# Patient Record
Sex: Female | Born: 1950 | Race: White | Hispanic: No | Smoking: Never smoker
Health system: Southern US, Community
[De-identification: ages and names within clinical notes are randomized; demographics above are authoritative.]

## PROBLEM LIST (undated history)

## (undated) DIAGNOSIS — K859 Acute pancreatitis without necrosis or infection, unspecified: Secondary | ICD-10-CM

## (undated) DIAGNOSIS — L570 Actinic keratosis: Secondary | ICD-10-CM

## (undated) DIAGNOSIS — N179 Acute kidney failure, unspecified: Secondary | ICD-10-CM

## (undated) DIAGNOSIS — M810 Age-related osteoporosis without current pathological fracture: Secondary | ICD-10-CM

## (undated) DIAGNOSIS — K219 Gastro-esophageal reflux disease without esophagitis: Secondary | ICD-10-CM

## (undated) DIAGNOSIS — I700 Atherosclerosis of aorta: Secondary | ICD-10-CM

## (undated) DIAGNOSIS — J90 Pleural effusion, not elsewhere classified: Secondary | ICD-10-CM

## (undated) DIAGNOSIS — R0600 Dyspnea, unspecified: Secondary | ICD-10-CM

## (undated) DIAGNOSIS — I4892 Unspecified atrial flutter: Secondary | ICD-10-CM

## (undated) DIAGNOSIS — Z9841 Cataract extraction status, right eye: Secondary | ICD-10-CM

## (undated) DIAGNOSIS — H409 Unspecified glaucoma: Secondary | ICD-10-CM

## (undated) DIAGNOSIS — I509 Heart failure, unspecified: Secondary | ICD-10-CM

## (undated) DIAGNOSIS — E119 Type 2 diabetes mellitus without complications: Secondary | ICD-10-CM

## (undated) DIAGNOSIS — R55 Syncope and collapse: Secondary | ICD-10-CM

## (undated) DIAGNOSIS — C50919 Malignant neoplasm of unspecified site of unspecified female breast: Secondary | ICD-10-CM

## (undated) DIAGNOSIS — J449 Chronic obstructive pulmonary disease, unspecified: Secondary | ICD-10-CM

## (undated) DIAGNOSIS — IMO0002 Reserved for concepts with insufficient information to code with codable children: Secondary | ICD-10-CM

## (undated) DIAGNOSIS — Z9289 Personal history of other medical treatment: Secondary | ICD-10-CM

## (undated) DIAGNOSIS — I4729 Other ventricular tachycardia: Secondary | ICD-10-CM

## (undated) DIAGNOSIS — I4891 Unspecified atrial fibrillation: Secondary | ICD-10-CM

## (undated) DIAGNOSIS — E039 Hypothyroidism, unspecified: Secondary | ICD-10-CM

## (undated) DIAGNOSIS — M1990 Unspecified osteoarthritis, unspecified site: Secondary | ICD-10-CM

## (undated) DIAGNOSIS — I351 Nonrheumatic aortic (valve) insufficiency: Secondary | ICD-10-CM

## (undated) DIAGNOSIS — D689 Coagulation defect, unspecified: Secondary | ICD-10-CM

## (undated) DIAGNOSIS — I050 Rheumatic mitral stenosis: Secondary | ICD-10-CM

## (undated) DIAGNOSIS — I2510 Atherosclerotic heart disease of native coronary artery without angina pectoris: Secondary | ICD-10-CM

## (undated) DIAGNOSIS — Z950 Presence of cardiac pacemaker: Secondary | ICD-10-CM

## (undated) DIAGNOSIS — C8190 Hodgkin lymphoma, unspecified, unspecified site: Secondary | ICD-10-CM

## (undated) DIAGNOSIS — K254 Chronic or unspecified gastric ulcer with hemorrhage: Secondary | ICD-10-CM

## (undated) DIAGNOSIS — R000 Tachycardia, unspecified: Secondary | ICD-10-CM

## (undated) DIAGNOSIS — H269 Unspecified cataract: Secondary | ICD-10-CM

## (undated) DIAGNOSIS — R490 Dysphonia: Secondary | ICD-10-CM

## (undated) DIAGNOSIS — C4491 Basal cell carcinoma of skin, unspecified: Secondary | ICD-10-CM

## (undated) DIAGNOSIS — I272 Pulmonary hypertension, unspecified: Secondary | ICD-10-CM

## (undated) DIAGNOSIS — R600 Localized edema: Secondary | ICD-10-CM

## (undated) DIAGNOSIS — T7840XA Allergy, unspecified, initial encounter: Secondary | ICD-10-CM

## (undated) DIAGNOSIS — I442 Atrioventricular block, complete: Secondary | ICD-10-CM

## (undated) DIAGNOSIS — Z952 Presence of prosthetic heart valve: Secondary | ICD-10-CM

## (undated) DIAGNOSIS — R9439 Abnormal result of other cardiovascular function study: Secondary | ICD-10-CM

## (undated) DIAGNOSIS — I340 Nonrheumatic mitral (valve) insufficiency: Secondary | ICD-10-CM

## (undated) DIAGNOSIS — I480 Paroxysmal atrial fibrillation: Secondary | ICD-10-CM

## (undated) DIAGNOSIS — R7303 Prediabetes: Secondary | ICD-10-CM

## (undated) DIAGNOSIS — E871 Hypo-osmolality and hyponatremia: Secondary | ICD-10-CM

## (undated) DIAGNOSIS — Z953 Presence of xenogenic heart valve: Secondary | ICD-10-CM

## (undated) DIAGNOSIS — D649 Anemia, unspecified: Secondary | ICD-10-CM

## (undated) DIAGNOSIS — J189 Pneumonia, unspecified organism: Secondary | ICD-10-CM

## (undated) DIAGNOSIS — E875 Hyperkalemia: Secondary | ICD-10-CM

## (undated) HISTORY — DX: Hypo-osmolality and hyponatremia: E87.1

## (undated) HISTORY — DX: Prediabetes: R73.03

## (undated) HISTORY — DX: Hyperkalemia: E87.5

## (undated) HISTORY — DX: Presence of prosthetic heart valve: Z95.2

## (undated) HISTORY — DX: Unspecified atrial flutter: I48.92

## (undated) HISTORY — PX: BREAST SURGERY: SHX581

## (undated) HISTORY — DX: Malignant neoplasm of unspecified site of unspecified female breast: C50.919

## (undated) HISTORY — DX: Chronic obstructive pulmonary disease, unspecified: J44.9

## (undated) HISTORY — DX: Age-related osteoporosis without current pathological fracture: M81.0

## (undated) HISTORY — DX: Allergy, unspecified, initial encounter: T78.40XA

## (undated) HISTORY — PX: ATRIAL FLUTTER ABLATION: SHX5733

## (undated) HISTORY — DX: Hodgkin lymphoma, unspecified, unspecified site: C81.90

## (undated) HISTORY — DX: Paroxysmal atrial fibrillation: I48.0

## (undated) HISTORY — DX: Acute pancreatitis without necrosis or infection, unspecified: K85.90

## (undated) HISTORY — DX: Nonrheumatic aortic (valve) insufficiency: I35.1

## (undated) HISTORY — PX: COLONOSCOPY: SHX174

## (undated) HISTORY — PX: LAPAROTOMY: SHX154

## (undated) HISTORY — DX: Nonrheumatic mitral (valve) insufficiency: I34.0

## (undated) HISTORY — PX: CATARACT EXTRACTION, BILATERAL: SHX1313

## (undated) HISTORY — DX: Unspecified osteoarthritis, unspecified site: M19.90

## (undated) HISTORY — DX: Heart failure, unspecified: I50.9

## (undated) HISTORY — DX: Unspecified cataract: H26.9

## (undated) HISTORY — DX: Basal cell carcinoma of skin, unspecified: C44.91

## (undated) HISTORY — DX: Unspecified glaucoma: H40.9

## (undated) HISTORY — PX: CARDIAC VALVE REPLACEMENT: SHX585

## (undated) HISTORY — PX: EYE SURGERY: SHX253

## (undated) HISTORY — DX: Reserved for concepts with insufficient information to code with codable children: IMO0002

## (undated) HISTORY — DX: Coagulation defect, unspecified: D68.9

## (undated) HISTORY — PX: TONSILLECTOMY AND ADENOIDECTOMY: SUR1326

## (undated) HISTORY — DX: Abnormal result of other cardiovascular function study: R94.39

## (undated) HISTORY — DX: Actinic keratosis: L57.0

## (undated) HISTORY — DX: Acute kidney failure, unspecified: N17.9

## (undated) HISTORY — PX: FRACTURE SURGERY: SHX138

---

## 1972-12-12 DIAGNOSIS — C8190 Hodgkin lymphoma, unspecified, unspecified site: Secondary | ICD-10-CM

## 1972-12-12 HISTORY — PX: LAPAROTOMY: SHX154

## 1972-12-12 HISTORY — PX: SPLENECTOMY: SUR1306

## 1972-12-12 HISTORY — DX: Hodgkin lymphoma, unspecified, unspecified site: C81.90

## 1996-12-12 HISTORY — PX: ATRIAL FLUTTER ABLATION: SHX5733

## 1997-12-12 HISTORY — PX: CARDIAC ELECTROPHYSIOLOGY MAPPING AND ABLATION: SHX1292

## 1998-08-03 ENCOUNTER — Encounter: Payer: Self-pay | Admitting: Emergency Medicine

## 1998-08-03 ENCOUNTER — Emergency Department (HOSPITAL_COMMUNITY): Admission: EM | Admit: 1998-08-03 | Discharge: 1998-08-03 | Payer: Self-pay | Admitting: Emergency Medicine

## 1998-12-12 DIAGNOSIS — K254 Chronic or unspecified gastric ulcer with hemorrhage: Secondary | ICD-10-CM

## 1998-12-12 HISTORY — DX: Chronic or unspecified gastric ulcer with hemorrhage: K25.4

## 1998-12-26 ENCOUNTER — Inpatient Hospital Stay (HOSPITAL_COMMUNITY): Admission: EM | Admit: 1998-12-26 | Discharge: 1998-12-31 | Payer: Self-pay | Admitting: Emergency Medicine

## 1998-12-28 ENCOUNTER — Encounter (HOSPITAL_BASED_OUTPATIENT_CLINIC_OR_DEPARTMENT_OTHER): Payer: Self-pay | Admitting: Internal Medicine

## 1998-12-30 ENCOUNTER — Encounter (HOSPITAL_BASED_OUTPATIENT_CLINIC_OR_DEPARTMENT_OTHER): Payer: Self-pay | Admitting: Internal Medicine

## 1999-01-01 ENCOUNTER — Encounter: Payer: Self-pay | Admitting: Emergency Medicine

## 1999-01-01 ENCOUNTER — Emergency Department (HOSPITAL_COMMUNITY): Admission: EM | Admit: 1999-01-01 | Discharge: 1999-01-01 | Payer: Self-pay | Admitting: Emergency Medicine

## 1999-07-01 ENCOUNTER — Other Ambulatory Visit: Admission: RE | Admit: 1999-07-01 | Discharge: 1999-07-01 | Payer: Self-pay | Admitting: *Deleted

## 1999-08-25 ENCOUNTER — Inpatient Hospital Stay (HOSPITAL_COMMUNITY): Admission: AD | Admit: 1999-08-25 | Discharge: 1999-08-31 | Payer: Self-pay | Admitting: Internal Medicine

## 1999-08-25 ENCOUNTER — Encounter: Payer: Self-pay | Admitting: Internal Medicine

## 1999-08-26 ENCOUNTER — Encounter: Payer: Self-pay | Admitting: General Surgery

## 1999-08-27 ENCOUNTER — Encounter: Payer: Self-pay | Admitting: General Surgery

## 2000-02-08 ENCOUNTER — Ambulatory Visit (HOSPITAL_COMMUNITY): Admission: RE | Admit: 2000-02-08 | Discharge: 2000-02-08 | Payer: Self-pay | Admitting: *Deleted

## 2000-11-09 ENCOUNTER — Other Ambulatory Visit: Admission: RE | Admit: 2000-11-09 | Discharge: 2000-11-09 | Payer: Self-pay | Admitting: *Deleted

## 2001-01-17 ENCOUNTER — Encounter: Admission: RE | Admit: 2001-01-17 | Discharge: 2001-01-17 | Payer: Self-pay | Admitting: Family Medicine

## 2001-01-17 ENCOUNTER — Encounter: Payer: Self-pay | Admitting: Family Medicine

## 2001-02-08 ENCOUNTER — Encounter (INDEPENDENT_AMBULATORY_CARE_PROVIDER_SITE_OTHER): Payer: Self-pay | Admitting: *Deleted

## 2001-02-08 ENCOUNTER — Ambulatory Visit (HOSPITAL_COMMUNITY): Admission: RE | Admit: 2001-02-08 | Discharge: 2001-02-08 | Payer: Self-pay | Admitting: Gastroenterology

## 2001-08-27 ENCOUNTER — Encounter: Payer: Self-pay | Admitting: Gastroenterology

## 2001-08-27 ENCOUNTER — Ambulatory Visit (HOSPITAL_COMMUNITY): Admission: RE | Admit: 2001-08-27 | Discharge: 2001-08-27 | Payer: Self-pay | Admitting: Gastroenterology

## 2001-09-03 ENCOUNTER — Encounter: Payer: Self-pay | Admitting: Gastroenterology

## 2001-09-03 ENCOUNTER — Ambulatory Visit (HOSPITAL_COMMUNITY): Admission: RE | Admit: 2001-09-03 | Discharge: 2001-09-03 | Payer: Self-pay | Admitting: Gastroenterology

## 2002-04-25 ENCOUNTER — Ambulatory Visit (HOSPITAL_BASED_OUTPATIENT_CLINIC_OR_DEPARTMENT_OTHER): Admission: RE | Admit: 2002-04-25 | Discharge: 2002-04-25 | Payer: Self-pay | Admitting: Orthopedic Surgery

## 2003-12-13 DIAGNOSIS — C50912 Malignant neoplasm of unspecified site of left female breast: Secondary | ICD-10-CM

## 2003-12-13 HISTORY — DX: Malignant neoplasm of unspecified site of left female breast: C50.912

## 2003-12-13 HISTORY — PX: MASTECTOMY: SHX3

## 2004-02-26 ENCOUNTER — Other Ambulatory Visit: Admission: RE | Admit: 2004-02-26 | Discharge: 2004-02-26 | Payer: Self-pay | Admitting: Internal Medicine

## 2004-11-30 ENCOUNTER — Inpatient Hospital Stay (HOSPITAL_COMMUNITY): Admission: AD | Admit: 2004-11-30 | Discharge: 2004-12-04 | Payer: Self-pay | Admitting: Gastroenterology

## 2004-11-30 ENCOUNTER — Ambulatory Visit (HOSPITAL_COMMUNITY): Admission: RE | Admit: 2004-11-30 | Discharge: 2004-11-30 | Payer: Self-pay | Admitting: Gastroenterology

## 2005-03-30 ENCOUNTER — Other Ambulatory Visit: Admission: RE | Admit: 2005-03-30 | Discharge: 2005-03-30 | Payer: Self-pay | Admitting: Radiology

## 2005-04-06 ENCOUNTER — Encounter: Admission: RE | Admit: 2005-04-06 | Discharge: 2005-04-06 | Payer: Self-pay | Admitting: General Surgery

## 2005-04-25 ENCOUNTER — Encounter (INDEPENDENT_AMBULATORY_CARE_PROVIDER_SITE_OTHER): Payer: Self-pay | Admitting: Specialist

## 2005-04-25 ENCOUNTER — Ambulatory Visit (HOSPITAL_COMMUNITY): Admission: AD | Admit: 2005-04-25 | Discharge: 2005-04-27 | Payer: Self-pay | Admitting: General Surgery

## 2005-05-04 ENCOUNTER — Ambulatory Visit: Payer: Self-pay | Admitting: Oncology

## 2005-05-20 ENCOUNTER — Ambulatory Visit (HOSPITAL_COMMUNITY): Admission: RE | Admit: 2005-05-20 | Discharge: 2005-05-20 | Payer: Self-pay | Admitting: Oncology

## 2005-05-24 ENCOUNTER — Ambulatory Visit (HOSPITAL_COMMUNITY): Admission: RE | Admit: 2005-05-24 | Discharge: 2005-05-24 | Payer: Self-pay | Admitting: General Surgery

## 2005-05-24 ENCOUNTER — Ambulatory Visit (HOSPITAL_BASED_OUTPATIENT_CLINIC_OR_DEPARTMENT_OTHER): Admission: RE | Admit: 2005-05-24 | Discharge: 2005-05-24 | Payer: Self-pay | Admitting: General Surgery

## 2005-05-31 ENCOUNTER — Ambulatory Visit: Payer: Self-pay

## 2005-06-22 ENCOUNTER — Ambulatory Visit: Payer: Self-pay | Admitting: Oncology

## 2005-08-12 ENCOUNTER — Ambulatory Visit: Payer: Self-pay | Admitting: Oncology

## 2005-09-26 ENCOUNTER — Ambulatory Visit (HOSPITAL_BASED_OUTPATIENT_CLINIC_OR_DEPARTMENT_OTHER): Admission: RE | Admit: 2005-09-26 | Discharge: 2005-09-26 | Payer: Self-pay | Admitting: General Surgery

## 2006-01-03 ENCOUNTER — Ambulatory Visit: Payer: Self-pay | Admitting: Oncology

## 2006-03-23 ENCOUNTER — Ambulatory Visit: Payer: Self-pay | Admitting: Oncology

## 2006-03-24 LAB — CBC WITH DIFFERENTIAL/PLATELET
BASO%: 0.5 % (ref 0.0–2.0)
Basophils Absolute: 0 10*3/uL (ref 0.0–0.1)
EOS%: 4 % (ref 0.0–7.0)
Eosinophils Absolute: 0.2 10*3/uL (ref 0.0–0.5)
HCT: 37.8 % (ref 34.8–46.6)
HGB: 12.8 g/dL (ref 11.6–15.9)
LYMPH%: 29.5 % (ref 14.0–48.0)
MCH: 30.3 pg (ref 26.0–34.0)
MCHC: 33.9 g/dL (ref 32.0–36.0)
MCV: 89.4 fL (ref 81.0–101.0)
MONO#: 0.8 10*3/uL (ref 0.1–0.9)
MONO%: 13.9 % — ABNORMAL HIGH (ref 0.0–13.0)
NEUT#: 3 10*3/uL (ref 1.5–6.5)
NEUT%: 52.1 % (ref 39.6–76.8)
Platelets: 442 10*3/uL — ABNORMAL HIGH (ref 145–400)
RBC: 4.22 10*6/uL (ref 3.70–5.32)
RDW: 15.1 % — ABNORMAL HIGH (ref 11.3–14.5)
WBC: 5.8 10*3/uL (ref 3.9–10.0)
lymph#: 1.7 10*3/uL (ref 0.9–3.3)

## 2006-03-24 LAB — COMPREHENSIVE METABOLIC PANEL
ALT: 17 U/L (ref 0–40)
AST: 16 U/L (ref 0–37)
Albumin: 4.7 g/dL (ref 3.5–5.2)
Alkaline Phosphatase: 88 U/L (ref 39–117)
BUN: 18 mg/dL (ref 6–23)
CO2: 26 mEq/L (ref 19–32)
Calcium: 9.3 mg/dL (ref 8.4–10.5)
Chloride: 106 mEq/L (ref 96–112)
Creatinine, Ser: 0.8 mg/dL (ref 0.4–1.2)
Glucose, Bld: 106 mg/dL — ABNORMAL HIGH (ref 70–99)
Potassium: 5.4 mEq/L — ABNORMAL HIGH (ref 3.5–5.3)
Sodium: 140 mEq/L (ref 135–145)
Total Bilirubin: 0.4 mg/dL (ref 0.3–1.2)
Total Protein: 6.8 g/dL (ref 6.0–8.3)

## 2006-03-24 LAB — LACTATE DEHYDROGENASE: LDH: 136 U/L (ref 94–250)

## 2006-03-24 LAB — CANCER ANTIGEN 27.29: CA 27.29: 9 U/mL (ref 0–39)

## 2006-09-05 ENCOUNTER — Ambulatory Visit: Payer: Self-pay | Admitting: Oncology

## 2006-09-07 LAB — CBC WITH DIFFERENTIAL/PLATELET
BASO%: 0.1 % (ref 0.0–2.0)
Basophils Absolute: 0 10*3/uL (ref 0.0–0.1)
EOS%: 1.4 % (ref 0.0–7.0)
Eosinophils Absolute: 0.1 10*3/uL (ref 0.0–0.5)
HCT: 40.6 % (ref 34.8–46.6)
HGB: 13.5 g/dL (ref 11.6–15.9)
LYMPH%: 12.7 % — ABNORMAL LOW (ref 14.0–48.0)
MCH: 29.6 pg (ref 26.0–34.0)
MCHC: 33.3 g/dL (ref 32.0–36.0)
MCV: 89.1 fL (ref 81.0–101.0)
MONO#: 0.8 10*3/uL (ref 0.1–0.9)
MONO%: 7.7 % (ref 0.0–13.0)
NEUT#: 8.4 10*3/uL — ABNORMAL HIGH (ref 1.5–6.5)
NEUT%: 78.1 % — ABNORMAL HIGH (ref 39.6–76.8)
Platelets: 479 10*3/uL — ABNORMAL HIGH (ref 145–400)
RBC: 4.56 10*6/uL (ref 3.70–5.32)
RDW: 14.6 % — ABNORMAL HIGH (ref 11.3–14.5)
WBC: 10.7 10*3/uL — ABNORMAL HIGH (ref 3.9–10.0)
lymph#: 1.4 10*3/uL (ref 0.9–3.3)

## 2006-09-07 LAB — COMPREHENSIVE METABOLIC PANEL
ALT: 25 U/L (ref 0–40)
AST: 19 U/L (ref 0–37)
Albumin: 4.7 g/dL (ref 3.5–5.2)
Alkaline Phosphatase: 113 U/L (ref 39–117)
BUN: 15 mg/dL (ref 6–23)
CO2: 29 mEq/L (ref 19–32)
Calcium: 9.5 mg/dL (ref 8.4–10.5)
Chloride: 105 mEq/L (ref 96–112)
Creatinine, Ser: 0.79 mg/dL (ref 0.40–1.20)
Glucose, Bld: 99 mg/dL (ref 70–99)
Potassium: 5.4 mEq/L — ABNORMAL HIGH (ref 3.5–5.3)
Sodium: 142 mEq/L (ref 135–145)
Total Bilirubin: 0.5 mg/dL (ref 0.3–1.2)
Total Protein: 7 g/dL (ref 6.0–8.3)

## 2006-09-07 LAB — CANCER ANTIGEN 27.29: CA 27.29: 11 U/mL (ref 0–39)

## 2006-09-07 LAB — LACTATE DEHYDROGENASE: LDH: 131 U/L (ref 94–250)

## 2007-01-01 ENCOUNTER — Ambulatory Visit: Payer: Self-pay | Admitting: Oncology

## 2007-01-04 LAB — CBC WITH DIFFERENTIAL/PLATELET
BASO%: 0.2 % (ref 0.0–2.0)
Basophils Absolute: 0 10*3/uL (ref 0.0–0.1)
EOS%: 1.1 % (ref 0.0–7.0)
Eosinophils Absolute: 0.1 10*3/uL (ref 0.0–0.5)
HCT: 38.7 % (ref 34.8–46.6)
HGB: 13 g/dL (ref 11.6–15.9)
LYMPH%: 21.3 % (ref 14.0–48.0)
MCH: 30.4 pg (ref 26.0–34.0)
MCHC: 33.5 g/dL (ref 32.0–36.0)
MCV: 90.8 fL (ref 81.0–101.0)
MONO#: 0.8 10*3/uL (ref 0.1–0.9)
MONO%: 7.9 % (ref 0.0–13.0)
NEUT#: 7.1 10*3/uL — ABNORMAL HIGH (ref 1.5–6.5)
NEUT%: 69.5 % (ref 39.6–76.8)
Platelets: 445 10*3/uL — ABNORMAL HIGH (ref 145–400)
RBC: 4.27 10*6/uL (ref 3.70–5.32)
RDW: 14.1 % (ref 11.3–14.5)
WBC: 10.3 10*3/uL — ABNORMAL HIGH (ref 3.9–10.0)
lymph#: 2.2 10*3/uL (ref 0.9–3.3)

## 2007-01-04 LAB — COMPREHENSIVE METABOLIC PANEL
ALT: 17 U/L (ref 0–35)
AST: 16 U/L (ref 0–37)
Albumin: 4.4 g/dL (ref 3.5–5.2)
Alkaline Phosphatase: 82 U/L (ref 39–117)
BUN: 19 mg/dL (ref 6–23)
CO2: 29 mEq/L (ref 19–32)
Calcium: 9.6 mg/dL (ref 8.4–10.5)
Chloride: 104 mEq/L (ref 96–112)
Creatinine, Ser: 1.01 mg/dL (ref 0.40–1.20)
Glucose, Bld: 108 mg/dL — ABNORMAL HIGH (ref 70–99)
Potassium: 4.9 mEq/L (ref 3.5–5.3)
Sodium: 142 mEq/L (ref 135–145)
Total Bilirubin: 0.3 mg/dL (ref 0.3–1.2)
Total Protein: 6.3 g/dL (ref 6.0–8.3)

## 2007-01-04 LAB — LACTATE DEHYDROGENASE: LDH: 132 U/L (ref 94–250)

## 2007-01-04 LAB — CANCER ANTIGEN 27.29: CA 27.29: 15 U/mL (ref 0–39)

## 2007-05-08 ENCOUNTER — Ambulatory Visit: Payer: Self-pay | Admitting: Oncology

## 2007-05-09 LAB — CBC WITH DIFFERENTIAL/PLATELET
BASO%: 0.3 % (ref 0.0–2.0)
Basophils Absolute: 0 10*3/uL (ref 0.0–0.1)
EOS%: 1.4 % (ref 0.0–7.0)
Eosinophils Absolute: 0.1 10*3/uL (ref 0.0–0.5)
HCT: 38.8 % (ref 34.8–46.6)
HGB: 13.3 g/dL (ref 11.6–15.9)
LYMPH%: 23 % (ref 14.0–48.0)
MCH: 30.4 pg (ref 26.0–34.0)
MCHC: 34.3 g/dL (ref 32.0–36.0)
MCV: 88.8 fL (ref 81.0–101.0)
MONO#: 0.9 10*3/uL (ref 0.1–0.9)
MONO%: 9.1 % (ref 0.0–13.0)
NEUT#: 6.4 10*3/uL (ref 1.5–6.5)
NEUT%: 66.2 % (ref 39.6–76.8)
Platelets: 522 10*3/uL — ABNORMAL HIGH (ref 145–400)
RBC: 4.36 10*6/uL (ref 3.70–5.32)
RDW: 14.7 % — ABNORMAL HIGH (ref 11.3–14.5)
WBC: 9.7 10*3/uL (ref 3.9–10.0)
lymph#: 2.2 10*3/uL (ref 0.9–3.3)

## 2007-05-09 LAB — COMPREHENSIVE METABOLIC PANEL
ALT: 27 U/L (ref 0–35)
AST: 16 U/L (ref 0–37)
Albumin: 4.8 g/dL (ref 3.5–5.2)
Alkaline Phosphatase: 67 U/L (ref 39–117)
BUN: 18 mg/dL (ref 6–23)
CO2: 27 mEq/L (ref 19–32)
Calcium: 9.3 mg/dL (ref 8.4–10.5)
Chloride: 105 mEq/L (ref 96–112)
Creatinine, Ser: 0.86 mg/dL (ref 0.40–1.20)
Glucose, Bld: 121 mg/dL — ABNORMAL HIGH (ref 70–99)
Potassium: 4.8 mEq/L (ref 3.5–5.3)
Sodium: 142 mEq/L (ref 135–145)
Total Bilirubin: 0.3 mg/dL (ref 0.3–1.2)
Total Protein: 7 g/dL (ref 6.0–8.3)

## 2007-05-09 LAB — LACTATE DEHYDROGENASE: LDH: 145 U/L (ref 94–250)

## 2007-05-09 LAB — CANCER ANTIGEN 27.29: CA 27.29: 8 U/mL (ref 0–39)

## 2007-06-05 ENCOUNTER — Encounter: Admission: RE | Admit: 2007-06-05 | Discharge: 2007-06-05 | Payer: Self-pay | Admitting: *Deleted

## 2007-11-15 ENCOUNTER — Ambulatory Visit: Payer: Self-pay | Admitting: Oncology

## 2007-11-19 LAB — CBC WITH DIFFERENTIAL/PLATELET
BASO%: 0.3 % (ref 0.0–2.0)
Basophils Absolute: 0 10*3/uL (ref 0.0–0.1)
EOS%: 2.5 % (ref 0.0–7.0)
Eosinophils Absolute: 0.2 10*3/uL (ref 0.0–0.5)
HCT: 37.2 % (ref 34.8–46.6)
HGB: 12.7 g/dL (ref 11.6–15.9)
LYMPH%: 30.9 % (ref 14.0–48.0)
MCH: 30.8 pg (ref 26.0–34.0)
MCHC: 34.3 g/dL (ref 32.0–36.0)
MCV: 89.9 fL (ref 81.0–101.0)
MONO#: 0.8 10*3/uL (ref 0.1–0.9)
MONO%: 11.2 % (ref 0.0–13.0)
NEUT#: 4 10*3/uL (ref 1.5–6.5)
NEUT%: 55.1 % (ref 39.6–76.8)
Platelets: 452 10*3/uL — ABNORMAL HIGH (ref 145–400)
RBC: 4.14 10*6/uL (ref 3.70–5.32)
RDW: 14.4 % (ref 11.3–14.5)
WBC: 7.3 10*3/uL (ref 3.9–10.0)
lymph#: 2.2 10*3/uL (ref 0.9–3.3)

## 2007-11-19 LAB — COMPREHENSIVE METABOLIC PANEL
ALT: 22 U/L (ref 0–35)
AST: 19 U/L (ref 0–37)
Albumin: 4.6 g/dL (ref 3.5–5.2)
Alkaline Phosphatase: 71 U/L (ref 39–117)
BUN: 12 mg/dL (ref 6–23)
CO2: 29 mEq/L (ref 19–32)
Calcium: 9.5 mg/dL (ref 8.4–10.5)
Chloride: 107 mEq/L (ref 96–112)
Creatinine, Ser: 0.79 mg/dL (ref 0.40–1.20)
Glucose, Bld: 104 mg/dL — ABNORMAL HIGH (ref 70–99)
Potassium: 4.6 mEq/L (ref 3.5–5.3)
Sodium: 144 mEq/L (ref 135–145)
Total Bilirubin: 0.5 mg/dL (ref 0.3–1.2)
Total Protein: 6.8 g/dL (ref 6.0–8.3)

## 2007-11-19 LAB — IRON AND TIBC
%SAT: 36 % (ref 20–55)
Iron: 120 ug/dL (ref 42–145)
TIBC: 336 ug/dL (ref 250–470)
UIBC: 216 ug/dL

## 2007-11-19 LAB — CANCER ANTIGEN 27.29: CA 27.29: 13 U/mL (ref 0–39)

## 2008-02-15 ENCOUNTER — Encounter: Admission: RE | Admit: 2008-02-15 | Discharge: 2008-02-15 | Payer: Self-pay | Admitting: Internal Medicine

## 2008-05-16 ENCOUNTER — Ambulatory Visit: Payer: Self-pay | Admitting: Oncology

## 2008-05-20 LAB — COMPREHENSIVE METABOLIC PANEL
ALT: 22 U/L (ref 0–35)
AST: 18 U/L (ref 0–37)
Albumin: 4.7 g/dL (ref 3.5–5.2)
Alkaline Phosphatase: 74 U/L (ref 39–117)
BUN: 16 mg/dL (ref 6–23)
CO2: 24 mEq/L (ref 19–32)
Calcium: 9.6 mg/dL (ref 8.4–10.5)
Chloride: 105 mEq/L (ref 96–112)
Creatinine, Ser: 0.81 mg/dL (ref 0.40–1.20)
Glucose, Bld: 120 mg/dL — ABNORMAL HIGH (ref 70–99)
Potassium: 4.5 mEq/L (ref 3.5–5.3)
Sodium: 143 mEq/L (ref 135–145)
Total Bilirubin: 0.5 mg/dL (ref 0.3–1.2)
Total Protein: 7.2 g/dL (ref 6.0–8.3)

## 2008-05-20 LAB — CBC WITH DIFFERENTIAL/PLATELET
BASO%: 0 % (ref 0.0–2.0)
Basophils Absolute: 0 10*3/uL (ref 0.0–0.1)
EOS%: 0.3 % (ref 0.0–7.0)
Eosinophils Absolute: 0 10*3/uL (ref 0.0–0.5)
HCT: 38.3 % (ref 34.8–46.6)
HGB: 13 g/dL (ref 11.6–15.9)
LYMPH%: 9.8 % — ABNORMAL LOW (ref 14.0–48.0)
MCH: 29.9 pg (ref 26.0–34.0)
MCHC: 33.9 g/dL (ref 32.0–36.0)
MCV: 88.3 fL (ref 81.0–101.0)
MONO#: 0.3 10*3/uL (ref 0.1–0.9)
MONO%: 1.9 % (ref 0.0–13.0)
NEUT#: 12.2 10*3/uL — ABNORMAL HIGH (ref 1.5–6.5)
NEUT%: 88 % — ABNORMAL HIGH (ref 39.6–76.8)
Platelets: 412 10*3/uL — ABNORMAL HIGH (ref 145–400)
RBC: 4.34 10*6/uL (ref 3.70–5.32)
RDW: 14.3 % (ref 11.3–14.5)
WBC: 13.9 10*3/uL — ABNORMAL HIGH (ref 3.9–10.0)
lymph#: 1.4 10*3/uL (ref 0.9–3.3)

## 2008-05-20 LAB — LACTATE DEHYDROGENASE: LDH: 159 U/L (ref 94–250)

## 2008-05-20 LAB — CANCER ANTIGEN 27.29: CA 27.29: 11 U/mL (ref 0–39)

## 2008-08-25 ENCOUNTER — Other Ambulatory Visit: Admission: RE | Admit: 2008-08-25 | Discharge: 2008-08-25 | Payer: Self-pay | Admitting: Internal Medicine

## 2008-09-08 ENCOUNTER — Encounter: Admission: RE | Admit: 2008-09-08 | Discharge: 2008-09-08 | Payer: Self-pay | Admitting: Internal Medicine

## 2008-11-24 ENCOUNTER — Ambulatory Visit: Payer: Self-pay | Admitting: Oncology

## 2008-11-26 LAB — CBC WITH DIFFERENTIAL/PLATELET
BASO%: 0.4 % (ref 0.0–2.0)
Basophils Absolute: 0 10*3/uL (ref 0.0–0.1)
EOS%: 2.1 % (ref 0.0–7.0)
Eosinophils Absolute: 0.1 10*3/uL (ref 0.0–0.5)
HCT: 36.1 % (ref 34.8–46.6)
HGB: 12.4 g/dL (ref 11.6–15.9)
LYMPH%: 31.9 % (ref 14.0–48.0)
MCH: 30.6 pg (ref 26.0–34.0)
MCHC: 34.3 g/dL (ref 32.0–36.0)
MCV: 89.4 fL (ref 81.0–101.0)
MONO#: 0.8 10*3/uL (ref 0.1–0.9)
MONO%: 10.3 % (ref 0.0–13.0)
NEUT#: 4 10*3/uL (ref 1.5–6.5)
NEUT%: 55.3 % (ref 39.6–76.8)
Platelets: 462 10*3/uL — ABNORMAL HIGH (ref 145–400)
RBC: 4.03 10*6/uL (ref 3.70–5.32)
RDW: 14.6 % — ABNORMAL HIGH (ref 11.3–14.5)
WBC: 7.3 10*3/uL (ref 3.9–10.0)
lymph#: 2.3 10*3/uL (ref 0.9–3.3)

## 2008-11-28 LAB — COMPREHENSIVE METABOLIC PANEL
ALT: 17 U/L (ref 0–35)
AST: 15 U/L (ref 0–37)
Albumin: 4.6 g/dL (ref 3.5–5.2)
Alkaline Phosphatase: 75 U/L (ref 39–117)
BUN: 12 mg/dL (ref 6–23)
CO2: 28 mEq/L (ref 19–32)
Calcium: 10 mg/dL (ref 8.4–10.5)
Chloride: 105 mEq/L (ref 96–112)
Creatinine, Ser: 0.76 mg/dL (ref 0.40–1.20)
Glucose, Bld: 104 mg/dL — ABNORMAL HIGH (ref 70–99)
Potassium: 5 mEq/L (ref 3.5–5.3)
Sodium: 141 mEq/L (ref 135–145)
Total Bilirubin: 0.5 mg/dL (ref 0.3–1.2)
Total Protein: 6.7 g/dL (ref 6.0–8.3)

## 2008-11-28 LAB — LACTATE DEHYDROGENASE: LDH: 159 U/L (ref 94–250)

## 2008-11-28 LAB — VITAMIN D 25 HYDROXY (VIT D DEFICIENCY, FRACTURES): Vit D, 25-Hydroxy: 34 ng/mL (ref 30–89)

## 2008-11-28 LAB — CANCER ANTIGEN 27.29: CA 27.29: 14 U/mL (ref 0–39)

## 2009-05-22 ENCOUNTER — Ambulatory Visit: Payer: Self-pay | Admitting: Oncology

## 2009-05-26 LAB — CBC WITH DIFFERENTIAL/PLATELET
BASO%: 0.4 % (ref 0.0–2.0)
Basophils Absolute: 0 10*3/uL (ref 0.0–0.1)
EOS%: 1.9 % (ref 0.0–7.0)
Eosinophils Absolute: 0.2 10*3/uL (ref 0.0–0.5)
HCT: 37.2 % (ref 34.8–46.6)
HGB: 13 g/dL (ref 11.6–15.9)
LYMPH%: 24 % (ref 14.0–49.7)
MCH: 31.4 pg (ref 25.1–34.0)
MCHC: 34.9 g/dL (ref 31.5–36.0)
MCV: 90.1 fL (ref 79.5–101.0)
MONO#: 1.1 10*3/uL — ABNORMAL HIGH (ref 0.1–0.9)
MONO%: 12.5 % (ref 0.0–14.0)
NEUT#: 5.5 10*3/uL (ref 1.5–6.5)
NEUT%: 61.2 % (ref 38.4–76.8)
Platelets: 455 10*3/uL — ABNORMAL HIGH (ref 145–400)
RBC: 4.13 10*6/uL (ref 3.70–5.45)
RDW: 14 % (ref 11.2–14.5)
WBC: 9 10*3/uL (ref 3.9–10.3)
lymph#: 2.2 10*3/uL (ref 0.9–3.3)

## 2009-05-26 LAB — COMPREHENSIVE METABOLIC PANEL
ALT: 19 U/L (ref 0–35)
AST: 22 U/L (ref 0–37)
Albumin: 4.3 g/dL (ref 3.5–5.2)
Alkaline Phosphatase: 54 U/L (ref 39–117)
BUN: 11 mg/dL (ref 6–23)
CO2: 29 mEq/L (ref 19–32)
Calcium: 9.7 mg/dL (ref 8.4–10.5)
Chloride: 106 mEq/L (ref 96–112)
Creatinine, Ser: 0.77 mg/dL (ref 0.40–1.20)
Glucose, Bld: 133 mg/dL — ABNORMAL HIGH (ref 70–99)
Potassium: 4.5 mEq/L (ref 3.5–5.3)
Sodium: 141 mEq/L (ref 135–145)
Total Bilirubin: 0.7 mg/dL (ref 0.3–1.2)
Total Protein: 6.8 g/dL (ref 6.0–8.3)

## 2009-05-26 LAB — LACTATE DEHYDROGENASE: LDH: 142 U/L (ref 94–250)

## 2009-05-27 LAB — VITAMIN D 25 HYDROXY (VIT D DEFICIENCY, FRACTURES): Vit D, 25-Hydroxy: 41 ng/mL (ref 30–89)

## 2009-05-27 LAB — CANCER ANTIGEN 27.29: CA 27.29: 10 U/mL (ref 0–39)

## 2009-11-24 ENCOUNTER — Ambulatory Visit: Payer: Self-pay | Admitting: Oncology

## 2009-11-25 LAB — CBC WITH DIFFERENTIAL/PLATELET
BASO%: 1.5 % (ref 0.0–2.0)
Basophils Absolute: 0.1 10*3/uL (ref 0.0–0.1)
EOS%: 2.2 % (ref 0.0–7.0)
Eosinophils Absolute: 0.2 10*3/uL (ref 0.0–0.5)
HCT: 39.3 % (ref 34.8–46.6)
HGB: 13.2 g/dL (ref 11.6–15.9)
LYMPH%: 30.9 % (ref 14.0–49.7)
MCH: 30.9 pg (ref 25.1–34.0)
MCHC: 33.5 g/dL (ref 31.5–36.0)
MCV: 92.2 fL (ref 79.5–101.0)
MONO#: 1 10*3/uL — ABNORMAL HIGH (ref 0.1–0.9)
MONO%: 10.4 % (ref 0.0–14.0)
NEUT#: 5.2 10*3/uL (ref 1.5–6.5)
NEUT%: 55 % (ref 38.4–76.8)
Platelets: 505 10*3/uL — ABNORMAL HIGH (ref 145–400)
RBC: 4.26 10*6/uL (ref 3.70–5.45)
RDW: 13.7 % (ref 11.2–14.5)
WBC: 9.4 10*3/uL (ref 3.9–10.3)
lymph#: 2.9 10*3/uL (ref 0.9–3.3)

## 2009-11-25 LAB — COMPREHENSIVE METABOLIC PANEL
ALT: 29 U/L (ref 0–35)
AST: 28 U/L (ref 0–37)
Albumin: 4.1 g/dL (ref 3.5–5.2)
Alkaline Phosphatase: 71 U/L (ref 39–117)
BUN: 12 mg/dL (ref 6–23)
CO2: 26 mEq/L (ref 19–32)
Calcium: 8.9 mg/dL (ref 8.4–10.5)
Chloride: 105 mEq/L (ref 96–112)
Creatinine, Ser: 0.8 mg/dL (ref 0.40–1.20)
Glucose, Bld: 137 mg/dL — ABNORMAL HIGH (ref 70–99)
Potassium: 4.1 mEq/L (ref 3.5–5.3)
Sodium: 137 mEq/L (ref 135–145)
Total Bilirubin: 0.5 mg/dL (ref 0.3–1.2)
Total Protein: 6.7 g/dL (ref 6.0–8.3)

## 2009-11-25 LAB — LACTATE DEHYDROGENASE: LDH: 150 U/L (ref 94–250)

## 2009-11-26 LAB — VITAMIN D 25 HYDROXY (VIT D DEFICIENCY, FRACTURES): Vit D, 25-Hydroxy: 38 ng/mL (ref 30–89)

## 2009-11-26 LAB — CANCER ANTIGEN 27.29: CA 27.29: 9 U/mL (ref 0–39)

## 2010-04-15 DIAGNOSIS — C4491 Basal cell carcinoma of skin, unspecified: Secondary | ICD-10-CM

## 2010-04-15 DIAGNOSIS — C4492 Squamous cell carcinoma of skin, unspecified: Secondary | ICD-10-CM

## 2010-04-15 HISTORY — DX: Squamous cell carcinoma of skin, unspecified: C44.92

## 2010-04-15 HISTORY — DX: Basal cell carcinoma of skin, unspecified: C44.91

## 2010-05-31 ENCOUNTER — Ambulatory Visit: Payer: Self-pay | Admitting: Oncology

## 2010-06-02 LAB — LACTATE DEHYDROGENASE: LDH: 153 U/L (ref 94–250)

## 2010-06-02 LAB — CBC WITH DIFFERENTIAL/PLATELET
BASO%: 0.2 % (ref 0.0–2.0)
Basophils Absolute: 0 10*3/uL (ref 0.0–0.1)
EOS%: 2.4 % (ref 0.0–7.0)
Eosinophils Absolute: 0.3 10*3/uL (ref 0.0–0.5)
HCT: 37.7 % (ref 34.8–46.6)
HGB: 12.8 g/dL (ref 11.6–15.9)
LYMPH%: 20.3 % (ref 14.0–49.7)
MCH: 30.8 pg (ref 25.1–34.0)
MCHC: 34 g/dL (ref 31.5–36.0)
MCV: 90.7 fL (ref 79.5–101.0)
MONO#: 1 10*3/uL — ABNORMAL HIGH (ref 0.1–0.9)
MONO%: 7.4 % (ref 0.0–14.0)
NEUT#: 9.3 10*3/uL — ABNORMAL HIGH (ref 1.5–6.5)
NEUT%: 69.7 % (ref 38.4–76.8)
Platelets: 560 10*3/uL — ABNORMAL HIGH (ref 145–400)
RBC: 4.16 10*6/uL (ref 3.70–5.45)
RDW: 13.8 % (ref 11.2–14.5)
WBC: 13.4 10*3/uL — ABNORMAL HIGH (ref 3.9–10.3)
lymph#: 2.7 10*3/uL (ref 0.9–3.3)

## 2010-06-02 LAB — COMPREHENSIVE METABOLIC PANEL
ALT: 22 U/L (ref 0–35)
AST: 24 U/L (ref 0–37)
Albumin: 4.3 g/dL (ref 3.5–5.2)
Alkaline Phosphatase: 74 U/L (ref 39–117)
BUN: 15 mg/dL (ref 6–23)
CO2: 25 mEq/L (ref 19–32)
Calcium: 9.6 mg/dL (ref 8.4–10.5)
Chloride: 104 mEq/L (ref 96–112)
Creatinine, Ser: 0.72 mg/dL (ref 0.40–1.20)
Glucose, Bld: 107 mg/dL — ABNORMAL HIGH (ref 70–99)
Potassium: 4.4 mEq/L (ref 3.5–5.3)
Sodium: 141 mEq/L (ref 135–145)
Total Bilirubin: 0.3 mg/dL (ref 0.3–1.2)
Total Protein: 6.4 g/dL (ref 6.0–8.3)

## 2010-06-02 LAB — CANCER ANTIGEN 27.29: CA 27.29: 9 U/mL (ref 0–39)

## 2010-06-02 LAB — VITAMIN D 25 HYDROXY (VIT D DEFICIENCY, FRACTURES): Vit D, 25-Hydroxy: 48 ng/mL (ref 30–89)

## 2010-09-07 ENCOUNTER — Ambulatory Visit: Payer: Self-pay | Admitting: Oncology

## 2010-09-09 LAB — CBC & DIFF AND RETIC
BASO%: 0.8 % (ref 0.0–2.0)
Basophils Absolute: 0.1 10*3/uL (ref 0.0–0.1)
EOS%: 3.3 % (ref 0.0–7.0)
Eosinophils Absolute: 0.3 10*3/uL (ref 0.0–0.5)
HCT: 38.8 % (ref 34.8–46.6)
HGB: 12.9 g/dL (ref 11.6–15.9)
Immature Retic Fract: 5.3 % (ref 0.00–10.70)
LYMPH%: 22.1 % (ref 14.0–49.7)
MCH: 30 pg (ref 25.1–34.0)
MCHC: 33.2 g/dL (ref 31.5–36.0)
MCV: 90.2 fL (ref 79.5–101.0)
MONO#: 1.1 10*3/uL — ABNORMAL HIGH (ref 0.1–0.9)
MONO%: 14.3 % — ABNORMAL HIGH (ref 0.0–14.0)
NEUT#: 4.5 10*3/uL (ref 1.5–6.5)
NEUT%: 59.5 % (ref 38.4–76.8)
Platelets: 501 10*3/uL — ABNORMAL HIGH (ref 145–400)
RBC: 4.3 10*6/uL (ref 3.70–5.45)
RDW: 14.7 % — ABNORMAL HIGH (ref 11.2–14.5)
Retic %: 1 % (ref 0.50–1.50)
Retic Ct Abs: 43 10*3/uL (ref 18.30–72.70)
WBC: 7.5 10*3/uL (ref 3.9–10.3)
lymph#: 1.7 10*3/uL (ref 0.9–3.3)

## 2010-09-09 LAB — MORPHOLOGY: PLT EST: INCREASED

## 2010-09-09 LAB — CHCC SMEAR

## 2010-09-14 LAB — JAK-2 V617F

## 2011-01-12 ENCOUNTER — Other Ambulatory Visit: Payer: Self-pay | Admitting: Gastroenterology

## 2011-01-14 ENCOUNTER — Ambulatory Visit
Admission: RE | Admit: 2011-01-14 | Discharge: 2011-01-14 | Disposition: A | Discharge status: Home / Self Care | Payer: BC Managed Care – PPO | Source: Ambulatory Visit | Attending: Gastroenterology | Admitting: Gastroenterology

## 2011-01-14 MED ORDER — IOHEXOL 300 MG/ML  SOLN
100.0000 mL | Freq: Once | INTRAMUSCULAR | Status: AC | PRN
  Administered 2011-01-14: 100 mL via INTRAVENOUS

## 2011-01-18 ENCOUNTER — Other Ambulatory Visit: Payer: Self-pay

## 2011-01-25 ENCOUNTER — Other Ambulatory Visit (HOSPITAL_COMMUNITY): Payer: Self-pay | Admitting: Neurosurgery

## 2011-01-25 DIAGNOSIS — M545 Low back pain, unspecified: Secondary | ICD-10-CM

## 2011-02-01 ENCOUNTER — Ambulatory Visit (HOSPITAL_COMMUNITY)
Admission: RE | Admit: 2011-02-01 | Discharge: 2011-02-01 | Disposition: A | Discharge status: Home / Self Care | Payer: BC Managed Care – PPO | Source: Ambulatory Visit | Attending: Neurosurgery | Admitting: Neurosurgery

## 2011-02-01 ENCOUNTER — Encounter (HOSPITAL_COMMUNITY)
Admission: RE | Admit: 2011-02-01 | Discharge: 2011-02-01 | Disposition: A | Discharge status: Home / Self Care | Payer: BC Managed Care – PPO | Source: Ambulatory Visit | Attending: Neurosurgery | Admitting: Neurosurgery

## 2011-02-01 DIAGNOSIS — C8589 Other specified types of non-Hodgkin lymphoma, extranodal and solid organ sites: Secondary | ICD-10-CM | POA: Insufficient documentation

## 2011-02-01 DIAGNOSIS — M545 Low back pain, unspecified: Secondary | ICD-10-CM | POA: Insufficient documentation

## 2011-02-01 DIAGNOSIS — Z853 Personal history of malignant neoplasm of breast: Secondary | ICD-10-CM | POA: Insufficient documentation

## 2011-02-01 DIAGNOSIS — Z9010 Acquired absence of unspecified breast and nipple: Secondary | ICD-10-CM | POA: Insufficient documentation

## 2011-02-01 DIAGNOSIS — Z87898 Personal history of other specified conditions: Secondary | ICD-10-CM | POA: Insufficient documentation

## 2011-02-01 MED ORDER — TECHNETIUM TC 99M MEDRONATE IV KIT
25.0000 | PACK | Freq: Once | INTRAVENOUS | Status: AC | PRN
  Administered 2011-02-01: 23.5 via INTRAVENOUS

## 2011-02-09 ENCOUNTER — Other Ambulatory Visit (HOSPITAL_COMMUNITY): Payer: BC Managed Care – PPO

## 2011-02-09 ENCOUNTER — Encounter (HOSPITAL_COMMUNITY): Payer: BC Managed Care – PPO

## 2011-02-09 ENCOUNTER — Ambulatory Visit (HOSPITAL_COMMUNITY): Payer: BC Managed Care – PPO

## 2011-02-10 ENCOUNTER — Other Ambulatory Visit: Payer: Self-pay | Admitting: Neurosurgery

## 2011-02-10 DIAGNOSIS — M545 Low back pain, unspecified: Secondary | ICD-10-CM

## 2011-02-11 ENCOUNTER — Ambulatory Visit
Admission: RE | Admit: 2011-02-11 | Discharge: 2011-02-11 | Disposition: A | Discharge status: Home / Self Care | Payer: BC Managed Care – PPO | Source: Ambulatory Visit | Attending: Neurosurgery | Admitting: Neurosurgery

## 2011-02-11 DIAGNOSIS — M545 Low back pain, unspecified: Secondary | ICD-10-CM

## 2011-02-11 MED ORDER — GADOBENATE DIMEGLUMINE 529 MG/ML IV SOLN
12.0000 mL | Freq: Once | INTRAVENOUS | Status: AC | PRN
  Administered 2011-02-11: 12 mL via INTRAVENOUS

## 2011-04-29 NOTE — Procedures (Signed)
[place]  Patient:    Patricia [name], Patricia [name]                     MRN: [id number] Proc. Date: 02/08/01 Adm. Date:  [id number] Attending:  Charna Elizabeth CC:         Lilia Pro, M.D., [facility]   Procedure Report  DATE OF BIRTH:  [birth year].  PROCEDURE:  Colonoscopy with snare polypectomy x 1.  ENDOSCOPIST:  Anselmo Rod, M.D.  INSTRUMENT USED:  Olympus video colonoscope.  INDICATION FOR PROCEDURE:  Family history of colon cancer (father) in a 45 year old white female.  Rule out colonic polyps, masses, hemorrhoids, etc.  PREPROCEDURE PREPARATION:  Informed consent was procured from the patient. The patient was fasted for eight hours prior to the procedure and prepped with a bottle of magnesium citrate and a gallon of NuLytely the night prior to the procedure.  PREPROCEDURE PHYSICAL:  VITAL SIGNS:  The patient had stable vital signs.  NECK:  Supple.  CHEST:  Clear to auscultation.  S1, S2 regular.  No murmur, rub, or gallop, rales, rhonchi, or wheezing.  ABDOMEN:  Soft with normal abdominal bowel sounds.  DESCRIPTION OF PROCEDURE:  The patient was placed in the left lateral decubitus position and sedated with 80 mg of Demerol and 8 mg of Versed intravenously.  Once the patient was adequately sedate and maintained on low-flow oxygen and continuous cardiac monitoring, the Olympus video colonoscope was advanced from the rectum to the cecum without difficulty. The patient had a fairly good prep.  There was a small external hemorrhoid seen on anal inspection.  Small internal hemorrhoids were appreciated on retroflexion in the rectum.  A small polyp was snared from 60 cm by snare polypectomy. This polyp was sessile.  The rest of the colon, including the transverse colon, right colon, cecum, and distal terminal ileum, appeared normal.  IMPRESSION: 1. Small external and small internal nonbleeding hemorrhoids. 2.  Small sessile polyp snared from 60 cm. 3. Otherwise normal-appearing terminal ileum, cecum, right colon, and    transverse colon.  RECOMMENDATIONS: 1. Avoid all nonsteroidals for the next two weeks. 2. Repeat colorectal cancer screening depending on the pathology results. 3. Outpatient follow-up on a p.r.n. basis.  The pathology results will be    called to the patient and further recommendations made over the phone. 4. A high-fiber diet with liberal fluid intake has been advocated. DD:  02/08/01 TD:  02/08/01 Job: [id number]

## 2011-04-29 NOTE — Op Note (Signed)
NAME:  Patricia [name], Patricia [name]              ACCOUNT NO.:  [id number]   MEDICAL RECORD NO.:  [id number]          PATIENT TYPE:  AMB   LOCATION:  DSC                          FACILITY:  [facility]   PHYSICIAN:  Gabrielle Dare. Janee Morn, M.D.DATE OF BIRTH:  1951/10/01   DATE OF PROCEDURE:  09/26/2005  DATE OF DISCHARGE:                                 OPERATIVE REPORT   PREOPERATIVE DIAGNOSIS:  1.  Left breast cancer.  2.  Status post Port-A-Cath placement.   POSTOP DIAGNOSES:  1.  Left breast cancer.  2.  Status post Port-A-Cath placement.   PROCEDURES:  Removal of Port-A-Cath.   SURGEON:  Violeta Gelinas.   ANESTHESIA:  MAC.   HISTORY OF PRESENT ILLNESS:  The patient is a 60 year old white female who  underwent left mastectomy and sentinel lymph node biopsy on Apr 25, 2005.  She had a Port-A-Cath placed in June.  She has completed her chemotherapy.  She now presents for removal of her Port-A-Cath.   PROCEDURE IN DETAIL:  Informed consent was obtained the patient was  identified then the preop holding area. She received intravenous  antibiotics. She was brought to the operating room. Conscious sedation was  administered by Anesthesia.  Her left chest and neck were prepped and draped  in sterile fashion.  A mixture of 0.25% Marcaine and 1% lidocaine plain in a  1:1 ratio was injected around her Port-A-Cath and along the tract of the  catheter up and around her left clavicle.  Incision was made over her old  scar.  Subcutaneous tissues were dissected down revealing the Port-A-Cath.  The two Prolene sutures that were holding it in place were cut and removed.  The catheter itself was then removed and pressure was held underneath the  clavicle for two minutes.  There was no bleeding. The port was then removed  from the pocket after a little bit more blunt dissection and it was passed  off.  The pocket was copiously irrigated. Meticulous hemostasis was ensured.  Some additional local anesthetic was  injected and then subcutaneous tissues  were closed with interrupted 3-0 Vicryl sutures and the skin was closed with  running 4-0 Monocryl subcuticular stitch. Sponge, needle and instrument  counts were all correct. Benzoin, Steri-Strips and sterile dressing were  applied. The patient tolerated procedure well without apparent complication  and was taken recovery in stable condition.      Gabrielle Dare Janee Morn, M.D.  Electronically Signed    BET/MEDQ  D:  09/26/2005  T:  09/26/2005  Job:  [id number]

## 2011-04-29 NOTE — Discharge Summary (Signed)
NAME:  Patricia [name], Patricia [name]              ACCOUNT NO.:  [id number]   MEDICAL RECORD NO.:  [id number]          PATIENT TYPE:  OIB   LOCATION:  5730                         FACILITY:  [facility]   PHYSICIAN:  Gabrielle Dare. Janee Morn, M.D.DATE OF BIRTH:  [birth year]   DATE OF ADMISSION:  04/25/2005  DATE OF DISCHARGE:  04/27/2005                                 DISCHARGE SUMMARY   DISCHARGE DIAGNOSES:  1.  Left breast cancer.  2.  Status post left mastectomy and sentinel lymph node biopsy.   HISTORY OF PRESENT ILLNESS:  Patricia [name] was noted on routine mammography  to have a mass beneath her areolar of left breast. 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   HOSPITAL COURSE:  The patient underwent uncomplicated left mastectomy with  sentinel lymph node biopsy. The preliminary report of the lymph node was  negative. Postoperatively she tolerated some intravenous pain medication for  a little longer than 24 hours so she stayed until postoperative day two.  Pain control was much better. Her wound was clean and dry. She was  instructed on use of her drain and some range of motion exercise and she is  discharged home on postoperative day two in stable condition.   DISCHARGE DIET:  Regular.   DISCHARGE ACTIVITY:  She is to gradually increase her activity and do some  range of motion exercises on her left arm.   MEDICATIONS:  The patient is to continue all of her at home medications and  in addition take Percocet 5/325 one - two p.o. q.6h. p.r.n. pain.   DISCHARGE INSTRUCTIONS:  Follow-up is with myself next week for drain  removal.      BET/MEDQ  D:  04/28/2005  T:  04/28/2005  Job:  [id number]

## 2011-04-29 NOTE — H&P (Signed)
NAME:  Patricia [name], Patricia [name]              ACCOUNT NO.:  [id number]   MEDICAL RECORD NO.:  [id number]          PATIENT TYPE:  INP   LOCATION:  5734                         FACILITY:  [facility]   PHYSICIAN:  Gabrielle Dare. Janee Morn, M.D.DATE OF BIRTH:  1951-03-15   DATE OF ADMISSION:  11/30/2004  DATE OF DISCHARGE:                                HISTORY & PHYSICAL   CHIEF COMPLAINT:  Abdominal pain with nausea and vomiting.   HISTORY OF PRESENT ILLNESS:  The patient is a 60 year old white female who  is a patient of Dr. Anselmo Rod. Dr. Loreta Ave follows her for some  peptic  ulcer disease in the past and colon polyps. The patient developed abdominal  pain with acute onset early this morning. The pain persisted and she had  some vomiting times one. Dr. Loreta Ave sent her for CT scan of the abdomen and  pelvis. This shows small bowel obstruction. No free air. The patient  continues to complain of some mild abdominal pain and nausea. She did vomit  some of her CT contrast as well. She has no other current complaints.   PAST MEDICAL HISTORY:  1.  Lymphoma which is in remission.  2.  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  3.  Peptic ulcer disease as previously described.   PAST SURGICAL HISTORY:  Exploratory laparotomy with staging for lymphoma in  1970s by Dr. Francina Ames from our practice. She had lymph node biopsy times  two by Dr. Maple Hudson associated with that diagnosis. She had ablation of her  aberrant conduction pathway which was complicated by a pericardial effusion  requiring drain.   SOCIAL HISTORY:  She does not smoke. She very rarely drinks alcohol.   ALLERGIES:  ASPIRIN and PENICILLIN.   MEDICATIONS:  Aciphex, Lopressor, calcium, multivitamins, and Clarinex.   PHYSICAL EXAMINATION:  VITAL SIGNS: Full vitals are pending, but heart rate  is in the 70s, respirations 16.  GENERAL: She is awake, alert, and in no acute distress.  HEENT: Her pupils are equal and  reactive.  NECK: Supple. There is no groin, supraclavicular, or cervical adenopathy  present.  LUNGS: Clear to auscultation with normal respiratory excursion.  HEART: Regular rate and rhythm. PMI is palpable in the left chest.  ABDOMEN: Mildly distended, but soft. Bowel sounds are active. She has some  mild tenderness along the right side with no guarding or peritoneal signs.  No organomegaly is palpated.  SKIN: Warm, dry, and intact. There is no significant peripheral edema.  NEUROLOGIC: Nonfocal.  MUSCULOSKELETAL: The patient has normal strength, gait, and station.   DATA REVIEW:  White blood cell count 11.6. Liver function tests within  normal limits.   CT scan of the abdomen and pelvis results are as above.   IMPRESSION AND PLAN:  Small bowel obstruction without need for emergent  exploration. This could be from adhesions from her previous surgery. The  patient has had one previous admission for small bowel obstruction which  resolved without operation. Plan will be admit her to the hospital,  resuscitate her with IV fluids, place an NG tube  for decompression. Will do  serial abdominal exams and follow up x-rays in the morning to make sure she  progresses to recover from this. I discussed this plan in detail with the  patient including the possibility that she may worsen and require surgery.  This is discussed with the patient and her husband. Questions were answered.       BET/MEDQ  D:  11/30/2004  T:  11/30/2004  Job:  [id number]

## 2011-06-03 ENCOUNTER — Other Ambulatory Visit: Payer: Self-pay | Admitting: Oncology

## 2011-06-03 ENCOUNTER — Encounter (HOSPITAL_BASED_OUTPATIENT_CLINIC_OR_DEPARTMENT_OTHER): Payer: BC Managed Care – PPO | Admitting: Oncology

## 2011-06-03 DIAGNOSIS — C50119 Malignant neoplasm of central portion of unspecified female breast: Secondary | ICD-10-CM

## 2011-06-03 DIAGNOSIS — Z8571 Personal history of Hodgkin lymphoma: Secondary | ICD-10-CM

## 2011-06-03 LAB — CBC WITH DIFFERENTIAL/PLATELET
BASO%: 0.5 % (ref 0.0–2.0)
Basophils Absolute: 0 10*3/uL (ref 0.0–0.1)
EOS%: 1.5 % (ref 0.0–7.0)
Eosinophils Absolute: 0.1 10*3/uL (ref 0.0–0.5)
HCT: 37 % (ref 34.8–46.6)
HGB: 12.5 g/dL (ref 11.6–15.9)
LYMPH%: 29.6 % (ref 14.0–49.7)
MCH: 29.6 pg (ref 25.1–34.0)
MCHC: 33.8 g/dL (ref 31.5–36.0)
MCV: 87.5 fL (ref 79.5–101.0)
MONO#: 0.8 10*3/uL (ref 0.1–0.9)
MONO%: 9.1 % (ref 0.0–14.0)
NEUT#: 5 10*3/uL (ref 1.5–6.5)
NEUT%: 59.3 % (ref 38.4–76.8)
Platelets: 438 10*3/uL — ABNORMAL HIGH (ref 145–400)
RBC: 4.23 10*6/uL (ref 3.70–5.45)
RDW: 14.4 % (ref 11.2–14.5)
WBC: 8.5 10*3/uL (ref 3.9–10.3)
lymph#: 2.5 10*3/uL (ref 0.9–3.3)
nRBC: 0 % (ref 0–0)

## 2011-06-04 LAB — VITAMIN D 25 HYDROXY (VIT D DEFICIENCY, FRACTURES): Vit D, 25-Hydroxy: 59 ng/mL (ref 30–89)

## 2011-06-04 LAB — COMPREHENSIVE METABOLIC PANEL
ALT: 14 U/L (ref 0–35)
AST: 17 U/L (ref 0–37)
Albumin: 4.5 g/dL (ref 3.5–5.2)
Alkaline Phosphatase: 58 U/L (ref 39–117)
BUN: 17 mg/dL (ref 6–23)
CO2: 26 mEq/L (ref 19–32)
Calcium: 10 mg/dL (ref 8.4–10.5)
Chloride: 104 mEq/L (ref 96–112)
Creatinine, Ser: 0.78 mg/dL (ref 0.50–1.10)
Glucose, Bld: 94 mg/dL (ref 70–99)
Potassium: 4.5 mEq/L (ref 3.5–5.3)
Sodium: 139 mEq/L (ref 135–145)
Total Bilirubin: 0.5 mg/dL (ref 0.3–1.2)
Total Protein: 6.7 g/dL (ref 6.0–8.3)

## 2011-06-04 LAB — LACTATE DEHYDROGENASE: LDH: 149 U/L (ref 94–250)

## 2011-06-09 ENCOUNTER — Encounter (HOSPITAL_BASED_OUTPATIENT_CLINIC_OR_DEPARTMENT_OTHER): Payer: BC Managed Care – PPO | Admitting: Oncology

## 2011-06-09 DIAGNOSIS — Z8571 Personal history of Hodgkin lymphoma: Secondary | ICD-10-CM

## 2011-06-09 DIAGNOSIS — Z853 Personal history of malignant neoplasm of breast: Secondary | ICD-10-CM

## 2011-11-07 LAB — HM COLONOSCOPY

## 2012-01-21 ENCOUNTER — Telehealth: Payer: Self-pay | Admitting: Oncology

## 2012-01-21 NOTE — Telephone Encounter (Signed)
S/w pt re appts for 6/21 and 6/28.

## 2012-02-09 ENCOUNTER — Encounter: Payer: Self-pay | Admitting: Cardiology

## 2012-02-16 ENCOUNTER — Encounter: Payer: Self-pay | Admitting: Cardiology

## 2012-03-23 ENCOUNTER — Encounter: Payer: Self-pay | Admitting: Cardiology

## 2012-04-10 ENCOUNTER — Encounter: Payer: Self-pay | Admitting: Cardiology

## 2012-04-26 ENCOUNTER — Encounter: Payer: Self-pay | Admitting: Cardiology

## 2012-04-26 ENCOUNTER — Ambulatory Visit (INDEPENDENT_AMBULATORY_CARE_PROVIDER_SITE_OTHER): Payer: BC Managed Care – PPO | Admitting: Cardiology

## 2012-04-26 ENCOUNTER — Telehealth: Payer: Self-pay | Admitting: Cardiology

## 2012-04-26 VITALS — BP 137/71 | HR 97 | Ht 66.0 in | Wt 132.8 lb

## 2012-04-26 DIAGNOSIS — R55 Syncope and collapse: Secondary | ICD-10-CM | POA: Insufficient documentation

## 2012-04-26 DIAGNOSIS — I10 Essential (primary) hypertension: Secondary | ICD-10-CM

## 2012-04-26 DIAGNOSIS — R9431 Abnormal electrocardiogram [ECG] [EKG]: Secondary | ICD-10-CM

## 2012-04-26 DIAGNOSIS — E875 Hyperkalemia: Secondary | ICD-10-CM | POA: Insufficient documentation

## 2012-04-26 DIAGNOSIS — R000 Tachycardia, unspecified: Secondary | ICD-10-CM | POA: Insufficient documentation

## 2012-04-26 DIAGNOSIS — R9439 Abnormal result of other cardiovascular function study: Secondary | ICD-10-CM | POA: Insufficient documentation

## 2012-04-26 LAB — TSH: TSH: 1.14 u[IU]/mL (ref 0.35–5.50)

## 2012-04-26 MED ORDER — METOPROLOL TARTRATE 50 MG PO TABS: 50.0000 mg | ORAL_TABLET | Freq: Two times a day (BID) | ORAL | Status: DC

## 2012-04-26 NOTE — Assessment & Plan Note (Signed)
As above.

## 2012-04-26 NOTE — Assessment & Plan Note (Signed)
This will be managed in the context of treating her hypertension. I will check a TSH.

## 2012-04-26 NOTE — Assessment & Plan Note (Signed)
There is a possibility of a false positive stress test given her hypertension and baseline EKG ST depression. I would consider CT coronary angiography but her heart rate might preclude this. I will therefore most likely proceed with pharmacologic stress perfusion testing. I will review the results of her echocardiogram prior to this.

## 2012-04-26 NOTE — Telephone Encounter (Signed)
error 

## 2012-04-26 NOTE — Assessment & Plan Note (Addendum)
Her blood pressure is elevated and she had a hypertensive response on treadmill testing. Given this and her hyperkalemia I will check an aldosterone level. I will also begin to up titrate her medications by increasing her metoprolol to 50 mg twice a day.  I would like to evaluate her abnormal EKG which suggests left ventricle hypertrophy with an echocardiogram.

## 2012-04-26 NOTE — Patient Instructions (Signed)
Please increase your Metoprolol to 50 mg twice a day Continue all other medications as listed  Please have blood work today (aldosterone level and TSH)  Your physician has requested that you have an echocardiogram. Echocardiography is a painless test that uses sound waves to create images of your heart. It provides your doctor with information about the size and shape of your heart and how well your heart's chambers and valves are working. This procedure takes approximately one hour. There are no restrictions for this procedure.  Follow up with Dr Antoine Poche in 1 month.

## 2012-04-26 NOTE — Progress Notes (Signed)
HPI The patient presents for evaluation of a syncopal episode. Her past history includes apparent flutter ablation in 1998. She's had occasional paroxysms of this very short lived it since then. She otherwise does well without cardiac complaints. In February while on vacation, after eating breakfast, while walking she had a syncopal episode. He had no prodrome. She wasn't feeling any palpitations. She otherwise had been well. She was eventually sent by her primary by 2 cardiologists for further evaluation. I able to review some strips from an exercise treadmill. During that she had a hypertensive blood pressure response with diffuse ST depression in multiple leads. It was apparently suggested that she have a cardiac catheterization. She wanted to have a second opinion. She has had no further syncope since the event in February. She's had no change in her infrequent tachypalpitations. She denies any orthostatic symptoms. She denies any chest pressure, neck or arm discomfort. She denies any shortness of breath, PND or orthopnea. She exercises daily and does aggressive yardwork without bringing on symptoms.  Allergies  Allergen Reactions  . Penicillins   . Sulfa Antibiotics     Current Outpatient Prescriptions  Medication Sig Dispense Refill  . alendronate (FOSAMAX) 35 MG tablet Take 35 mg by mouth every 7 (seven) days. Take with a full glass of water on an empty stomach.      . cholecalciferol (VITAMIN D) 1000 UNITS tablet Take 1,000 Units by mouth daily. 2 tabs      . dexlansoprazole (DEXILANT) 60 MG capsule Take 60 mg by mouth daily.      . ergocalciferol (VITAMIN D2) 50000 UNITS capsule Take 50,000 Units by mouth every 30 (thirty) days.      . fish oil-omega-3 fatty acids 1000 MG capsule Take 1,200 mg by mouth daily.      . Magnesium Gluconate (MAG-G PO) Take 500 mg by mouth daily.      . metoprolol (LOPRESSOR) 50 MG tablet Take 1 tablet (50 mg total) by mouth 2 (two) times daily.  60 tablet  6   . Multiple Vitamins-Minerals (CENTRUM SILVER PO) Take by mouth daily.      Marland Kitchen DISCONTD: metoprolol (LOPRESSOR) 50 MG tablet Take 50 mg by mouth daily.        Past Medical History  Diagnosis Date  . Hodgkin's lymphoma     Treated with radiation and chemo  . Ulcer   . Breast cancer   . Atrial flutter     Ablated 1998 Dr. Graciela Husbands    Past Surgical History  Procedure Date  . Mastectomy     Left  . Laparotomy   . Splenectomy   . Tonsillectomy and adenoidectomy     Family History  Problem Relation Age of Onset  . Rheumatic fever Father     Died suddenly age 82  . Diabetes Father     History   Social History  . Marital Status: Married    Spouse Name: N/A    Number of Children: N/A  . Years of Education: N/A   Occupational History  . Teacher (retired)    Social History Main Topics  . Smoking status: Never Smoker   . Smokeless tobacco: Not on file  . Alcohol Use: Not on file  . Drug Use: Not on file  . Sexually Active: Not on file   Other Topics Concern  . Not on file   Social History Narrative   Lives at home with husband.    ROS: Positive for seasonal allergies. Otherwise  as stated in the HPI and negative for all other systems.   PHYSICAL EXAM BP 137/71  Pulse 97  Ht 5\' 6"  (1.676 m)  Wt 132 lb 12.8 oz (60.238 kg)  BMI 21.43 kg/m2 GENERAL:  Well appearing HEENT:  Pupils equal round and reactive, fundi not visualized, oral mucosa unremarkable NECK:  No jugular venous distention, waveform within normal limits, carotid upstroke brisk and symmetric, no bruits, no thyromegaly LYMPHATICS:  No cervical, inguinal adenopathy LUNGS:  Clear to auscultation bilaterally BACK:  No CVA tenderness CHEST:  Unremarkable HEART:  PMI not displaced or sustained,S1 and S2 within normal limits, no S3, no S4, no clicks, no rubs, no murmurs ABD:  Flat, positive bowel sounds normal in frequency in pitch, no bruits, no rebound, no guarding, no midline pulsatile mass, no  hepatomegaly, no splenomegaly EXT:  2 plus pulses throughout, no edema, no cyanosis no clubbing SKIN:  No rashes no nodules NEURO:  Cranial nerves II through XII grossly intact, motor grossly intact throughout PSYCH:  Cognitively intact, oriented to person place and time   EKG:  Sinus rhythm, rate 104, axis within normal limits, QTC slightly prolonged, nonspecific diffuse mild ST depression, LVH by voltage criteria. 04/26/2012   ASSESSMENT AND PLAN

## 2012-04-26 NOTE — Assessment & Plan Note (Signed)
The etiology of this is unclear. She's had no presyncope or syncope since February. I do not suspect any arrhythmia. She was not orthostatic in the office. No change in therapy is indicated. Evaluation will be as above.

## 2012-05-01 ENCOUNTER — Ambulatory Visit (HOSPITAL_COMMUNITY): Payer: BC Managed Care – PPO | Attending: Cardiology

## 2012-05-01 ENCOUNTER — Other Ambulatory Visit: Payer: Self-pay

## 2012-05-01 ENCOUNTER — Institutional Professional Consult (permissible substitution): Payer: BC Managed Care – PPO | Admitting: Cardiology

## 2012-05-01 DIAGNOSIS — R9431 Abnormal electrocardiogram [ECG] [EKG]: Secondary | ICD-10-CM

## 2012-05-01 DIAGNOSIS — I080 Rheumatic disorders of both mitral and aortic valves: Secondary | ICD-10-CM | POA: Insufficient documentation

## 2012-05-01 DIAGNOSIS — R9439 Abnormal result of other cardiovascular function study: Secondary | ICD-10-CM

## 2012-05-01 DIAGNOSIS — I379 Nonrheumatic pulmonary valve disorder, unspecified: Secondary | ICD-10-CM | POA: Insufficient documentation

## 2012-05-01 DIAGNOSIS — R55 Syncope and collapse: Secondary | ICD-10-CM | POA: Insufficient documentation

## 2012-05-01 DIAGNOSIS — I079 Rheumatic tricuspid valve disease, unspecified: Secondary | ICD-10-CM | POA: Insufficient documentation

## 2012-05-03 LAB — ALDOSTERONE + RENIN ACTIVITY W/ RATIO
ALDO / PRA Ratio: 4.9 Ratio (ref 0.9–28.9)
Aldosterone: 2 ng/dL
PRA LC/MS/MS: 0.41 ng/mL/h (ref 0.25–5.82)

## 2012-05-09 ENCOUNTER — Telehealth: Payer: Self-pay | Admitting: *Deleted

## 2012-05-09 DIAGNOSIS — R55 Syncope and collapse: Secondary | ICD-10-CM

## 2012-05-09 DIAGNOSIS — I340 Nonrheumatic mitral (valve) insufficiency: Secondary | ICD-10-CM

## 2012-05-09 NOTE — Telephone Encounter (Signed)
Message copied by Antony Odea on Wed May 09, 2012  4:12 PM ------      Message from: Rollene Rotunda      Created: Mon May 07, 2012 12:30 PM       Echo showed mod to severe MR which is a surprise.  This would neither explain the abnormal ETT or previous syncope. She should have a Scientist, physiological.  I would like to see her back after this to discuss the echo and review the stress test.  Call Ms. Grafton with the results and send results to Juline Patch, MD

## 2012-05-09 NOTE — Telephone Encounter (Signed)
Patient lexiscan Patricia [name] is scheduled for 05/17/12 @ 8:45. Patient is aware.

## 2012-05-09 NOTE — Telephone Encounter (Signed)
Please call pt with date and time for lexiscan, pt has app to f/u with Dr Antoine Poche already 06/07/12= he wanted to see pt post.

## 2012-05-10 ENCOUNTER — Other Ambulatory Visit (HOSPITAL_COMMUNITY): Payer: Self-pay | Admitting: Internal Medicine

## 2012-05-10 DIAGNOSIS — R0602 Shortness of breath: Secondary | ICD-10-CM

## 2012-05-16 ENCOUNTER — Ambulatory Visit (HOSPITAL_COMMUNITY)
Admission: RE | Admit: 2012-05-16 | Discharge: 2012-05-16 | Disposition: A | Discharge status: Home / Self Care | Payer: BC Managed Care – PPO | Source: Ambulatory Visit | Attending: Internal Medicine | Admitting: Internal Medicine

## 2012-05-16 DIAGNOSIS — R0602 Shortness of breath: Secondary | ICD-10-CM | POA: Insufficient documentation

## 2012-05-17 ENCOUNTER — Ambulatory Visit (HOSPITAL_COMMUNITY): Payer: BC Managed Care – PPO | Attending: Cardiology | Admitting: Radiology

## 2012-05-17 VITALS — BP 146/66 | Ht 66.0 in | Wt 134.0 lb

## 2012-05-17 DIAGNOSIS — I4892 Unspecified atrial flutter: Secondary | ICD-10-CM

## 2012-05-17 DIAGNOSIS — I340 Nonrheumatic mitral (valve) insufficiency: Secondary | ICD-10-CM

## 2012-05-17 DIAGNOSIS — R55 Syncope and collapse: Secondary | ICD-10-CM

## 2012-05-17 DIAGNOSIS — R9439 Abnormal result of other cardiovascular function study: Secondary | ICD-10-CM

## 2012-05-17 DIAGNOSIS — I059 Rheumatic mitral valve disease, unspecified: Secondary | ICD-10-CM | POA: Insufficient documentation

## 2012-05-17 DIAGNOSIS — R0602 Shortness of breath: Secondary | ICD-10-CM

## 2012-05-17 MED ORDER — TECHNETIUM TC 99M TETROFOSMIN IV KIT
33.0000 | PACK | Freq: Once | INTRAVENOUS | Status: AC | PRN
  Administered 2012-05-17: 33 via INTRAVENOUS

## 2012-05-17 MED ORDER — REGADENOSON 0.4 MG/5ML IV SOLN
0.4000 mg | Freq: Once | INTRAVENOUS | Status: AC
  Administered 2012-05-17: 0.4 mg via INTRAVENOUS

## 2012-05-17 MED ORDER — TECHNETIUM TC 99M TETROFOSMIN IV KIT
11.0000 | PACK | Freq: Once | INTRAVENOUS | Status: AC | PRN
  Administered 2012-05-17: 11 via INTRAVENOUS

## 2012-05-17 NOTE — Progress Notes (Signed)
[facility] SITE 3 NUCLEAR MED [address] [place] [address] [phone]  Cardiology Nuclear Med Study  Patricia [name] is a 61 y.o. female     MRN : [id number]     DOB: [birth year]  Procedure Date: 05/17/2012  Nuclear Med Background Indication for Stress Test:  Evaluation for Ischemia History:   2006 Chemo, 1998 Ablation-A-Flutter, 4/13 Abnormal GXT with significant ST changes, 5/13 Echo EF 55-60% Moderate to Severe MR Cardiac Risk Factors: Hypertension  Symptoms:  DOE, Light-Headedness, Rapid HR and Syncope   Nuclear Pre-Procedure Caffeine/Decaff Intake:  None NPO After: 7:00pm   Lungs:  clear O2 Sat: 98% on room air. IV 0.9% NS with Angio Cath:  20g  IV Site: R Wrist  IV Started by:  Stanton Kidney, EMT-P  Chest Size (in):  34 Cup Size: C  Height: 5\' 6"  (1.676 m)  Weight:  134 lb (60.782 kg)  BMI:  Body mass index is 21.63 kg/(m^2). Tech Comments:  Metoprolol held > 24 hours, per patient.    Nuclear Med Study 1 or 2 day study: 1 day  Stress Test Type:  Lexiscan  Reading MD: Willa Rough, MD  Order Authorizing Provider:  Rollene Rotunda, MD  Resting Radionuclide: Technetium 64m Tetrofosmin  Resting Radionuclide Dose: 11.0 mCi   Stress Radionuclide:  Technetium 4m Tetrofosmin  Stress Radionuclide Dose: 33.0 mCi           Stress Protocol Rest HR: 88 Stress HR: 127  Rest BP: 146/66 Stress BP: 139/47  Exercise Time (min): n/a METS: n/a   Predicted Max HR: 159 bpm % Max HR: 79.87 bpm Rate Pressure Product: 84132   Dose of Adenosine (mg):  n/a Dose of Lexiscan: 0.4 mg  Dose of Atropine (mg): n/a Dose of Dobutamine: n/a mcg/kg/min (at max HR)  Stress Test Technologist: Bonnita Levan, RN  Nuclear Technologist:  Domenic Polite, CNMT     Rest Procedure:  Myocardial perfusion imaging was performed at rest 45 minutes following the intravenous administration of Technetium 39m Tetrofosmin. Rest ECG: NSR with non-specific ST-T wave changes  Stress  Procedure:  The patient received IV Lexiscan 0.4 mg over 15-seconds.  Technetium 75m Tetrofosmin injected at 30-seconds.  There were non-specific ST-T changes with Lexiscan.  Quantitative spect images were obtained after a 45 minute delay. Stress ECG: No significant change from baseline ECG  QPS Raw Data Images:  Patient motion noted; appropriate software correction applied. Stress Images:  There is some interference from nuclear activity from structures below the diaphragm. This affects the appearance of the uptake in the inferolateral wall. There is no diagnostic abnormality Rest Images:  The images are the same as stress. Subtraction (SDS):  No evidence of ischemia. Transient Ischemic Dilatation (Normal <1.22):  0.98 Lung/Heart Ratio (Normal <0.45):  0.27  Quantitative Gated Spect Images QGS EDV:  64 ml QGS ESV:  19 ml  Impression Exercise Capacity:  Lexiscan with no exercise. BP Response:  Normal blood pressure response. Clinical Symptoms:  dyspnea ECG Impression:  No significant ST segment change suggestive of ischemia. Comparison with Prior Nuclear Study: No images to compare  Overall Impression:  Normal stress nuclear study.  There is interference from nuclear activity from structures below the diaphragm. This affects the tomographic images somewhat. It creates a slight artifact in the area of the inferolateral wall. I feel that this is not a significant abnormality. There is no definite scar or ischemia.  LV Ejection Fraction: 71%.  LV Wall Motion:  Normal  Wall Motion  Willa Rough, MD

## 2012-05-21 ENCOUNTER — Telehealth: Payer: Self-pay | Admitting: Cardiology

## 2012-05-21 NOTE — Telephone Encounter (Signed)
Fu call °Patient returning your call °

## 2012-05-21 NOTE — Telephone Encounter (Signed)
Pt aware of lexiscan results. Appt 06/07/12 with Dr. Antoine Poche Results faxed to PCP Mylo Red RN

## 2012-05-25 ENCOUNTER — Other Ambulatory Visit: Payer: Self-pay

## 2012-05-25 DIAGNOSIS — R9439 Abnormal result of other cardiovascular function study: Secondary | ICD-10-CM

## 2012-05-25 DIAGNOSIS — R9431 Abnormal electrocardiogram [ECG] [EKG]: Secondary | ICD-10-CM

## 2012-05-25 MED ORDER — METOPROLOL TARTRATE 50 MG PO TABS: 50.0000 mg | ORAL_TABLET | Freq: Two times a day (BID) | ORAL | Status: DC

## 2012-05-29 ENCOUNTER — Other Ambulatory Visit: Payer: Self-pay | Admitting: *Deleted

## 2012-05-29 DIAGNOSIS — R9431 Abnormal electrocardiogram [ECG] [EKG]: Secondary | ICD-10-CM

## 2012-05-29 DIAGNOSIS — R9439 Abnormal result of other cardiovascular function study: Secondary | ICD-10-CM

## 2012-05-29 MED ORDER — METOPROLOL TARTRATE 50 MG PO TABS: 50.0000 mg | ORAL_TABLET | Freq: Two times a day (BID) | ORAL | Status: DC

## 2012-06-01 ENCOUNTER — Other Ambulatory Visit: Payer: BC Managed Care – PPO | Admitting: Lab

## 2012-06-04 ENCOUNTER — Other Ambulatory Visit (HOSPITAL_BASED_OUTPATIENT_CLINIC_OR_DEPARTMENT_OTHER): Payer: BC Managed Care – PPO | Admitting: Lab

## 2012-06-04 DIAGNOSIS — Z8571 Personal history of Hodgkin lymphoma: Secondary | ICD-10-CM

## 2012-06-04 DIAGNOSIS — C50119 Malignant neoplasm of central portion of unspecified female breast: Secondary | ICD-10-CM

## 2012-06-04 LAB — COMPREHENSIVE METABOLIC PANEL
ALT: 24 U/L (ref 0–35)
AST: 20 U/L (ref 0–37)
Albumin: 4.6 g/dL (ref 3.5–5.2)
Alkaline Phosphatase: 56 U/L (ref 39–117)
BUN: 15 mg/dL (ref 6–23)
CO2: 28 mEq/L (ref 19–32)
Calcium: 9.5 mg/dL (ref 8.4–10.5)
Chloride: 105 mEq/L (ref 96–112)
Creatinine, Ser: 0.77 mg/dL (ref 0.50–1.10)
Glucose, Bld: 103 mg/dL — ABNORMAL HIGH (ref 70–99)
Potassium: 4.6 mEq/L (ref 3.5–5.3)
Sodium: 136 mEq/L (ref 135–145)
Total Bilirubin: 0.4 mg/dL (ref 0.3–1.2)
Total Protein: 6.7 g/dL (ref 6.0–8.3)

## 2012-06-04 LAB — CBC WITH DIFFERENTIAL/PLATELET
BASO%: 0.8 % (ref 0.0–2.0)
Basophils Absolute: 0.1 10*3/uL (ref 0.0–0.1)
EOS%: 2 % (ref 0.0–7.0)
Eosinophils Absolute: 0.1 10*3/uL (ref 0.0–0.5)
HCT: 37.8 % (ref 34.8–46.6)
HGB: 12.9 g/dL (ref 11.6–15.9)
LYMPH%: 34.3 % (ref 14.0–49.7)
MCH: 29.9 pg (ref 25.1–34.0)
MCHC: 34.1 g/dL (ref 31.5–36.0)
MCV: 87.5 fL (ref 79.5–101.0)
MONO#: 0.7 10*3/uL (ref 0.1–0.9)
MONO%: 9.8 % (ref 0.0–14.0)
NEUT#: 3.5 10*3/uL (ref 1.5–6.5)
NEUT%: 53.1 % (ref 38.4–76.8)
Platelets: 392 10*3/uL (ref 145–400)
RBC: 4.32 10*6/uL (ref 3.70–5.45)
RDW: 14.6 % — ABNORMAL HIGH (ref 11.2–14.5)
WBC: 6.6 10*3/uL (ref 3.9–10.3)
lymph#: 2.3 10*3/uL (ref 0.9–3.3)

## 2012-06-04 LAB — LACTATE DEHYDROGENASE: LDH: 157 U/L (ref 94–250)

## 2012-06-04 LAB — CANCER ANTIGEN 27.29: CA 27.29: 14 U/mL (ref 0–39)

## 2012-06-07 ENCOUNTER — Encounter: Payer: Self-pay | Admitting: *Deleted

## 2012-06-07 ENCOUNTER — Ambulatory Visit (INDEPENDENT_AMBULATORY_CARE_PROVIDER_SITE_OTHER): Payer: BC Managed Care – PPO | Admitting: Cardiology

## 2012-06-07 ENCOUNTER — Encounter: Payer: Self-pay | Admitting: Cardiology

## 2012-06-07 VITALS — BP 122/65 | HR 72 | Ht 66.0 in | Wt 136.8 lb

## 2012-06-07 DIAGNOSIS — R55 Syncope and collapse: Secondary | ICD-10-CM

## 2012-06-07 DIAGNOSIS — I059 Rheumatic mitral valve disease, unspecified: Secondary | ICD-10-CM

## 2012-06-07 DIAGNOSIS — I340 Nonrheumatic mitral (valve) insufficiency: Secondary | ICD-10-CM | POA: Insufficient documentation

## 2012-06-07 DIAGNOSIS — I10 Essential (primary) hypertension: Secondary | ICD-10-CM

## 2012-06-07 NOTE — Assessment & Plan Note (Signed)
She has had no further episodes. No further evaluation is indicated.

## 2012-06-07 NOTE — Progress Notes (Signed)
 HPI The patient presents for evaluation of mitral regurgitation.  She was referred to me for evaluation of a syncopal episode and abnormal EKG. Stress perfusion study demonstrated an EF of 71% with normal wall motion. However, transthoracic echo demonstrated moderate to severe mitral regurgitation. There was no mention of prolapse though she's had this history in the past. She denies to me any acute shortness of breath, PND or orthopnea. She might get occasional palpitations and be a little breathless with these but she's had no sustained tachypalpitations. She's had no chest pressure, neck or arm discomfort. She's had no weight gain or edema.  Of note she did have some pulmonary function tests done recently and these were mildly abnormal with decreased airflow in diffusion capacity. She has not wanted referral to a pulmonologist at this point.  Allergies  Allergen Reactions  . Penicillins   . Sulfa Antibiotics     Current Outpatient Prescriptions  Medication Sig Dispense Refill  . alendronate (FOSAMAX) 35 MG tablet Take 35 mg by mouth every 7 (seven) days. Take with a full glass of water on an empty stomach.      . CALCIUM CITRATE PO Take 1,000 mg by mouth daily.      . cholecalciferol (VITAMIN D) 1000 UNITS tablet Take 1,000 Units by mouth daily. 2 tabs      . ergocalciferol (VITAMIN D2) 50000 UNITS capsule Take 50,000 Units by mouth every 30 (thirty) days.      . fish oil-omega-3 fatty acids 1000 MG capsule Take 1,200 mg by mouth daily.      . Magnesium Gluconate (MAG-G PO) Take 500 mg by mouth daily.      . metoprolol (LOPRESSOR) 50 MG tablet Take 1 tablet (50 mg total) by mouth 2 (two) times daily.  180 tablet  3  . Multiple Vitamins-Minerals (CENTRUM SILVER PO) Take by mouth daily.      . dexlansoprazole (DEXILANT) 60 MG capsule Take 60 mg by mouth daily.        Past Medical History  Diagnosis Date  . Hodgkin's lymphoma     Treated with radiation and chemo  . Ulcer   . Breast  cancer   . Atrial flutter     Ablated 1998 Dr. Klein    Past Surgical History  Procedure Date  . Mastectomy     Left  . Laparotomy   . Splenectomy   . Tonsillectomy and adenoidectomy      ROS:  As stated in the HPI and negative for all other systems.   PHYSICAL EXAM BP 122/65  Pulse 72  Ht 5' 6" (1.676 m)  Wt 136 lb 12.8 oz (62.052 kg)  BMI 22.08 kg/m2 GENERAL:  Well appearing HEENT:  Pupils equal round and reactive, fundi not visualized, oral mucosa unremarkable NECK:  No jugular venous distention, waveform within normal limits, carotid upstroke brisk and symmetric, no bruits, no thyromegaly LYMPHATICS:  No cervical, inguinal adenopathy LUNGS:  Clear to auscultation bilaterally BACK:  No CVA tenderness CHEST:  Status post mastectomy HEART:  PMI not displaced or sustained,S1 and S2 within normal limits, no S3, no S4, no clicks, no rubs, very focal left axillary murmur midsystolic, no diastolic murmur. ABD:  Flat, positive bowel sounds normal in frequency in pitch, no bruits, no rebound, no guarding, no midline pulsatile mass, no hepatomegaly, no splenomegaly EXT:  2 plus pulses throughout, no edema, no cyanosis no clubbing SKIN:  No rashes no nodules NEURO:  Cranial nerves II through XII grossly   intact, motor grossly intact throughout PSYCH:  Cognitively intact, oriented to person place and time   ASSESSMENT AN I. 

## 2012-06-07 NOTE — Patient Instructions (Addendum)
The current medical regimen is effective;  continue present plan and medications.  Your physician has requested that you have a TEE. During a TEE, sound waves are used to create images of your heart. It provides your doctor with information about the size and shape of your heart and how well your heart's chambers and valves are working. In this test, a transducer is attached to the end of a flexible tube that's guided down your throat and into your esophagus (the tube leading from you mouth to your stomach) to get a more detailed image of your heart. You are not awake for the procedure. Please see the instruction sheet given to you today. For further information please visit [email or web address]   Follow up will be scheduled after testing.

## 2012-06-07 NOTE — Assessment & Plan Note (Signed)
The blood pressure is at target. No change in medications is indicated. We will continue with therapeutic lifestyle changes (TLC).  

## 2012-06-07 NOTE — Assessment & Plan Note (Signed)
The patient has mitral regurgitation as described. The next step will be a transesophageal ultrasound. Risks and benefits were described. She understands and agrees to proceed. Further management will be based on this result.

## 2012-06-08 ENCOUNTER — Encounter (HOSPITAL_COMMUNITY): Payer: Self-pay | Admitting: Pharmacy Technician

## 2012-06-08 ENCOUNTER — Telehealth: Payer: Self-pay | Admitting: *Deleted

## 2012-06-08 ENCOUNTER — Ambulatory Visit (HOSPITAL_BASED_OUTPATIENT_CLINIC_OR_DEPARTMENT_OTHER): Payer: BC Managed Care – PPO | Admitting: Oncology

## 2012-06-08 VITALS — BP 156/74 | HR 93 | Temp 98.1°F | Ht 66.0 in | Wt 135.5 lb

## 2012-06-08 DIAGNOSIS — Z171 Estrogen receptor negative status [ER-]: Secondary | ICD-10-CM

## 2012-06-08 DIAGNOSIS — C50119 Malignant neoplasm of central portion of unspecified female breast: Secondary | ICD-10-CM

## 2012-06-08 DIAGNOSIS — E559 Vitamin D deficiency, unspecified: Secondary | ICD-10-CM

## 2012-06-08 DIAGNOSIS — C50919 Malignant neoplasm of unspecified site of unspecified female breast: Secondary | ICD-10-CM

## 2012-06-08 NOTE — Telephone Encounter (Signed)
Per orders from 06-07-2012 starting at 9:30am gave to the patient

## 2012-06-08 NOTE — Progress Notes (Signed)
Hematology and Oncology Follow Up Visit  KEMYA SHED [id number] [birth year] 61 y.o. 06/08/2012 10:01 AM   DIAGNOSIS:   Encounter Diagnoses  Name Primary?  . Malignant neoplasm of breast (female), unspecified site Yes  . Unspecified vitamin D deficiency      1. 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  Proliferation index was low at 6%.  She completed 4 cycles of adjuvant Taxotere/Cytoxan in August 2006. Remote history of Hodgkin's disease.   Interim History:  Patient returns for followup. She is doing fairly well. Of note is that she is syncopal episode in February. She have further investigations. She is known to have mitral valve prolapse and currently per her cardiologist it would appear that she has more significant mitral regurgitation. She is due to have a TEE next week. Medications are unchanged except for her beta blocker with his dose has been increased .  Medications: I have reviewed the patient's current medications.  Allergies:  Allergies  Allergen Reactions  . Penicillins   . Sulfa Antibiotics     Past Medical History, Surgical history, Social history, and Family History were reviewed and updated.  Review of Systems: Constitutional:  Negative for fever, chills, night sweats, anorexia, weight loss, pain. Cardiovascular: negative Respiratory: negative Neurological: negative Dermatological: negative ENT: negative Skin Gastrointestinal: negative Genito-Urinary: negative Hematological and Lymphatic: negative Breast: negative Musculoskeletal: negative Remaining ROS negative.  Physical Exam:  Blood pressure 156/74,  pulse 93, temperature 98.1 F (36.7 C), temperature source Oral, height 5\' 6"  (1.676 m), weight 135 lb 8 oz (61.462 kg).  ECOG: 0  HEENT:  Sclerae anicteric, conjunctivae pink.  Oropharynx clear.  No mucositis or candidiasis.  Nodes:  No cervical, supraclavicular, or axillary lymphadenopathy palpated.  Breast Exam:  Right breast is benign.  No masses, discharge, skin change, or nipple inversion.  Left breast surgically absent, chest wall exam is unremarkable. Both axilla negative.  Lungs:  Clear to auscultation bilaterally.  No crackles, rhonchi, or wheezes.  Heart:  Regular rate and rhythm.  Abdomen:  Soft, nontender.  Positive bowel sounds.  No organomegaly or masses palpated.  Musculoskeletal:  No focal spinal tenderness to palpation.  Extremities:  Benign.  No peripheral edema or cyanosis.  Skin:  Benign.  Neuro:  Nonfocal.     Lab Results: Lab Results  Component Value Date   WBC 6.6 06/04/2012   HGB 12.9 06/04/2012   HCT 37.8 06/04/2012   MCV 87.5 06/04/2012   PLT 392 06/04/2012     Chemistry      Component Value Date/Time   NA 136 06/04/2012 1334   K 4.6 06/04/2012 1334   CL 105 06/04/2012 1334   CO2 28 06/04/2012 1334   BUN 15 06/04/2012 1334   CREATININE 0.77 06/04/2012 1334      Component Value Date/Time   CALCIUM 9.5 06/04/2012 1334   ALKPHOS 56 06/04/2012 1334   AST 20 06/04/2012 1334   ALT 24 06/04/2012 1334   BILITOT 0.4 06/04/2012 1334       Radiological Studies:  No results found.   IMPRESSIONS AND PLAN: A 61 y.o. 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 Distant history of  Hodgkin's. Now presenting with more significant valvular incompetence. I will continue to see her in a years time. She has had a recent mammogram at [facility] in May. Her labwork appears to be normal within normal limits.  Spent more than half the time coordinating care, as well as discussion of BMI and its  implications.      Bennette Hasty 6/28/201310:01 AM Cell [id number]

## 2012-06-10 ENCOUNTER — Other Ambulatory Visit: Payer: Self-pay | Admitting: Cardiology

## 2012-06-10 DIAGNOSIS — I340 Nonrheumatic mitral (valve) insufficiency: Secondary | ICD-10-CM

## 2012-06-11 MED ORDER — SODIUM CHLORIDE 0.9 % IV SOLN: 250.0000 mL | INTRAVENOUS | Status: DC | PRN

## 2012-06-11 MED ORDER — SODIUM CHLORIDE 0.9 % IJ SOLN: 3.0000 mL | Freq: Two times a day (BID) | INTRAMUSCULAR | Status: DC

## 2012-06-11 MED ORDER — SODIUM CHLORIDE 0.9 % IJ SOLN: 3.0000 mL | INTRAMUSCULAR | Status: DC | PRN

## 2012-06-11 MED ORDER — FENTANYL CITRATE 0.05 MG/ML IJ SOLN: 250.0000 ug | Freq: Once | INTRAMUSCULAR | Status: DC

## 2012-06-11 MED ORDER — BENZOCAINE 20 % MT SOLN
1.0000 "application " | OROMUCOSAL | Status: DC | PRN
  Filled 2012-06-11: qty 57

## 2012-06-11 MED ORDER — SODIUM CHLORIDE 0.45 % IV SOLN: INTRAVENOUS | Status: DC

## 2012-06-11 MED ORDER — MIDAZOLAM HCL 10 MG/2ML IJ SOLN: 10.0000 mg | Freq: Once | INTRAMUSCULAR | Status: DC

## 2012-06-12 ENCOUNTER — Ambulatory Visit (HOSPITAL_COMMUNITY)
Admission: RE | Admit: 2012-06-12 | Discharge: 2012-06-12 | Disposition: A | Discharge status: Home / Self Care | Payer: BC Managed Care – PPO | Source: Ambulatory Visit | Attending: Cardiology | Admitting: Cardiology

## 2012-06-12 ENCOUNTER — Encounter (HOSPITAL_COMMUNITY): Payer: Self-pay | Admitting: *Deleted

## 2012-06-12 ENCOUNTER — Encounter (HOSPITAL_COMMUNITY)
Admission: RE | Disposition: A | Discharge status: Home / Self Care | Payer: Self-pay | Source: Ambulatory Visit | Attending: Cardiology

## 2012-06-12 DIAGNOSIS — I340 Nonrheumatic mitral (valve) insufficiency: Secondary | ICD-10-CM

## 2012-06-12 DIAGNOSIS — I059 Rheumatic mitral valve disease, unspecified: Secondary | ICD-10-CM | POA: Insufficient documentation

## 2012-06-12 HISTORY — PX: TEE WITHOUT CARDIOVERSION: SHX5443

## 2012-06-12 SURGERY — Surgical Case
Anesthesia: *Unknown

## 2012-06-12 SURGERY — ECHOCARDIOGRAM, TRANSESOPHAGEAL
Anesthesia: Moderate Sedation

## 2012-06-12 MED ORDER — FENTANYL CITRATE 0.05 MG/ML IJ SOLN
INTRAMUSCULAR | Status: AC
  Filled 2012-06-12: qty 2

## 2012-06-12 MED ORDER — BUTAMBEN-TETRACAINE-BENZOCAINE 2-2-14 % EX AERO
INHALATION_SPRAY | CUTANEOUS | Status: DC | PRN
  Administered 2012-06-12: 2 via TOPICAL

## 2012-06-12 MED ORDER — MIDAZOLAM HCL 10 MG/2ML IJ SOLN
INTRAMUSCULAR | Status: AC
  Filled 2012-06-12: qty 2

## 2012-06-12 MED ORDER — FENTANYL CITRATE 0.05 MG/ML IJ SOLN
INTRAMUSCULAR | Status: DC | PRN
  Administered 2012-06-12 (×3): 25 ug via INTRAVENOUS

## 2012-06-12 MED ORDER — MIDAZOLAM HCL 10 MG/2ML IJ SOLN
INTRAMUSCULAR | Status: DC | PRN
  Administered 2012-06-12: 1 mg via INTRAVENOUS
  Administered 2012-06-12: 2 mg via INTRAVENOUS
  Administered 2012-06-12: 1 mg via INTRAVENOUS

## 2012-06-12 NOTE — Progress Notes (Signed)
*  PRELIMINARY RESULTS* Echocardiogram Echocardiogram Transesophageal has been performed.  Patricia [name] 06/12/2012, 2:56 PM

## 2012-06-12 NOTE — H&P (View-Only) (Signed)
HPI The patient presents for evaluation of mitral regurgitation.  She was referred to me for evaluation of a syncopal episode and abnormal EKG. Stress perfusion study demonstrated an EF of 71% with normal wall motion. However, transthoracic echo demonstrated moderate to severe mitral regurgitation. There was no mention of prolapse though she's had this history in the past. She denies to me any acute shortness of breath, PND or orthopnea. She might get occasional palpitations and be a little breathless with these but she's had no sustained tachypalpitations. She's had no chest pressure, neck or arm discomfort. She's had no weight gain or edema.  Of note she did have some pulmonary function tests done recently and these were mildly abnormal with decreased airflow in diffusion capacity. She has not wanted referral to a pulmonologist at this point.  Allergies  Allergen Reactions  . Penicillins   . Sulfa Antibiotics     Current Outpatient Prescriptions  Medication Sig Dispense Refill  . alendronate (FOSAMAX) 35 MG tablet Take 35 mg by mouth every 7 (seven) days. Take with a full glass of water on an empty stomach.      Marland Kitchen CALCIUM CITRATE PO Take 1,000 mg by mouth daily.      . cholecalciferol (VITAMIN D) 1000 UNITS tablet Take 1,000 Units by mouth daily. 2 tabs      . ergocalciferol (VITAMIN D2) 50000 UNITS capsule Take 50,000 Units by mouth every 30 (thirty) days.      . fish oil-omega-3 fatty acids 1000 MG capsule Take 1,200 mg by mouth daily.      . Magnesium Gluconate (MAG-G PO) Take 500 mg by mouth daily.      . metoprolol (LOPRESSOR) 50 MG tablet Take 1 tablet (50 mg total) by mouth 2 (two) times daily.  180 tablet  3  . Multiple Vitamins-Minerals (CENTRUM SILVER PO) Take by mouth daily.      Marland Kitchen dexlansoprazole (DEXILANT) 60 MG capsule Take 60 mg by mouth daily.        Past Medical History  Diagnosis Date  . Hodgkin's lymphoma     Treated with radiation and chemo  . Ulcer   . Breast  cancer   . Atrial flutter     Ablated 1998 Dr. Graciela Husbands    Past Surgical History  Procedure Date  . Mastectomy     Left  . Laparotomy   . Splenectomy   . Tonsillectomy and adenoidectomy      ROS:  As stated in the HPI and negative for all other systems.   PHYSICAL EXAM BP 122/65  Pulse 72  Ht 5\' 6"  (1.676 m)  Wt 136 lb 12.8 oz (62.052 kg)  BMI 22.08 kg/m2 GENERAL:  Well appearing HEENT:  Pupils equal round and reactive, fundi not visualized, oral mucosa unremarkable NECK:  No jugular venous distention, waveform within normal limits, carotid upstroke brisk and symmetric, no bruits, no thyromegaly LYMPHATICS:  No cervical, inguinal adenopathy LUNGS:  Clear to auscultation bilaterally BACK:  No CVA tenderness CHEST:  Status post mastectomy HEART:  PMI not displaced or sustained,S1 and S2 within normal limits, no S3, no S4, no clicks, no rubs, very focal left axillary murmur midsystolic, no diastolic murmur. ABD:  Flat, positive bowel sounds normal in frequency in pitch, no bruits, no rebound, no guarding, no midline pulsatile mass, no hepatomegaly, no splenomegaly EXT:  2 plus pulses throughout, no edema, no cyanosis no clubbing SKIN:  No rashes no nodules NEURO:  Cranial nerves II through XII grossly  intact, motor grossly intact throughout PSYCH:  Cognitively intact, oriented to person place and time   ASSESSMENT AN I.

## 2012-06-12 NOTE — Interval H&P Note (Signed)
History and Physical Interval Note:  06/12/2012 1:37 PM  Patricia [name]  has presented today for surgery, with the diagnosis of mitral regurge  The various methods of treatment have been discussed with the patient and family. After consideration of risks, benefits and other options for treatment, the patient has consented to  Procedure(s) (LRB): TRANSESOPHAGEAL ECHOCARDIOGRAM (TEE) (N/A) as a surgical intervention .  The patient's history has been reviewed, patient examined, no change in status, stable for surgery.  I have reviewed the patients' chart and labs.  Questions were answered to the patient's satisfaction.     Eliel Dudding [employer]

## 2012-06-12 NOTE — CV Procedure (Signed)
Procedure: TEE  Indication: Mitral regurgitation  Sedation: Versed 4 mg, Fentanyl 75 mcg  Findings: Please see echo section of chart for full details.  Normal LV size and systolic function, EF 60%.  Mild bileaflet mitral valve prolapse without flail segment.  Mitral regurgitation appears moderate with PISA ERO around 0.28 cm^2.    No complications.   Marca Ancona 06/12/2012 2:14 PM

## 2012-06-15 ENCOUNTER — Telehealth: Payer: Self-pay | Admitting: Cardiology

## 2012-06-15 NOTE — Telephone Encounter (Signed)
Error

## 2012-09-20 ENCOUNTER — Encounter: Payer: Self-pay | Admitting: Cardiology

## 2013-03-26 ENCOUNTER — Encounter: Payer: Self-pay | Admitting: Cardiovascular Disease

## 2013-03-28 ENCOUNTER — Encounter: Payer: Self-pay | Admitting: Cardiology

## 2013-04-03 ENCOUNTER — Other Ambulatory Visit: Payer: Self-pay | Admitting: Internal Medicine

## 2013-04-03 ENCOUNTER — Other Ambulatory Visit (HOSPITAL_COMMUNITY)
Admission: RE | Admit: 2013-04-03 | Discharge: 2013-04-03 | Disposition: A | Discharge status: Home / Self Care | Payer: BC Managed Care – PPO | Source: Ambulatory Visit | Attending: Internal Medicine | Admitting: Internal Medicine

## 2013-04-03 DIAGNOSIS — Z01419 Encounter for gynecological examination (general) (routine) without abnormal findings: Secondary | ICD-10-CM | POA: Insufficient documentation

## 2013-04-04 ENCOUNTER — Telehealth: Payer: Self-pay | Admitting: *Deleted

## 2013-04-04 NOTE — Telephone Encounter (Signed)
Called and spoke with patient to reschedule her appt. Patient states she would prefer to see Dr. Darrold Span.  I will forward a message to [place] and [place].  I informed patient she would get a call to reschedule her appt.  She is due at the end of June.  I have cancelled her appt.

## 2013-04-17 ENCOUNTER — Encounter: Payer: Self-pay | Admitting: Oncology

## 2013-04-17 ENCOUNTER — Telehealth: Payer: Self-pay | Admitting: Oncology

## 2013-04-17 NOTE — Telephone Encounter (Signed)
S/w pt today re appt for 7/1 lb/LL. Former PR pt requesting LL. Letter/schedule mailed.

## 2013-05-03 ENCOUNTER — Other Ambulatory Visit: Payer: Self-pay | Admitting: Cardiology

## 2013-05-15 ENCOUNTER — Encounter: Payer: Self-pay | Admitting: Oncology

## 2013-05-17 ENCOUNTER — Ambulatory Visit: Payer: BC Managed Care – PPO | Admitting: Cardiology

## 2013-05-21 ENCOUNTER — Ambulatory Visit (INDEPENDENT_AMBULATORY_CARE_PROVIDER_SITE_OTHER): Payer: BC Managed Care – PPO | Admitting: Cardiology

## 2013-05-21 ENCOUNTER — Encounter: Payer: Self-pay | Admitting: Cardiology

## 2013-05-21 VITALS — BP 140/80 | HR 86 | Ht 66.0 in | Wt 141.2 lb

## 2013-05-21 DIAGNOSIS — I340 Nonrheumatic mitral (valve) insufficiency: Secondary | ICD-10-CM

## 2013-05-21 DIAGNOSIS — R55 Syncope and collapse: Secondary | ICD-10-CM

## 2013-05-21 DIAGNOSIS — I059 Rheumatic mitral valve disease, unspecified: Secondary | ICD-10-CM

## 2013-05-21 NOTE — Progress Notes (Signed)
HPI The patient presents for evaluation of mitral regurgitation. Transthoracic echo demonstrated moderate to severe mitral regurgitation with mitral valve prolapse. However, followup TEE suggested the MR to be moderate. At the time of his workup she had presented with syncope. She also had a history of flutter ablated.  Since I last saw her she has had no new cardiovascular complaints. She has some rare palpitations. She has some occasional shortness of breath that seems to respond to bronchodilators. She reports an abnormal PFTs but she has not yet seen a pulmonologist. However, she typically can be very vigorously active without any symptoms. She doesn't report presyncope or syncope. She has no chest pressure, neck or arm discomfort.  She's had no weight gain or edema.  Allergies  Allergen Reactions  . Penicillins Anaphylaxis, Hives and Swelling  . Sulfa Antibiotics Hives and Rash    Current Outpatient Prescriptions  Medication Sig Dispense Refill  . alendronate (FOSAMAX) 35 MG tablet Take 35 mg by mouth every 7 (seven) days. Take on Sundays. Take with a full glass of water on an empty stomach.      . Calcium Carbonate (CALCIUM 500 PO) Take 1 tablet by mouth daily.      . cholecalciferol (VITAMIN D) 1000 UNITS tablet Take 2,000 Units by mouth daily.       Marland Kitchen dexlansoprazole (DEXILANT) 60 MG capsule Take 60 mg by mouth daily.      . ergocalciferol (VITAMIN D2) 50000 UNITS capsule Take 50,000 Units by mouth every 30 (thirty) days. Take on the 1st of every month.      . magnesium gluconate (MAGONATE) 500 MG tablet Take 500 mg by mouth daily.      . metoprolol (LOPRESSOR) 50 MG tablet Take 50 mg by mouth 2 (two) times daily.      . metoprolol (LOPRESSOR) 50 MG tablet TAKE 1 TABLET BY MOUTH TWICE A DAY  60 tablet  6  . Multiple Vitamin (MULTIVITAMIN WITH MINERALS) TABS Take 1 tablet by mouth daily.      . Omega-3 Fatty Acids (FISH OIL) 1200 MG CAPS Take 1,200 mg by mouth daily.       No current  facility-administered medications for this visit.    Past Medical History  Diagnosis Date  . Hodgkin's lymphoma     Treated with radiation and chemo  . Ulcer   . Breast cancer   . Atrial flutter     Ablated 1998 Dr. Graciela Husbands    Past Surgical History  Procedure Laterality Date  . Mastectomy      Left  . Laparotomy    . Splenectomy    . Tonsillectomy and adenoidectomy    . Tee without cardioversion  06/12/2012    Procedure: TRANSESOPHAGEAL ECHOCARDIOGRAM (TEE);  Surgeon: Laurey Morale, MD;  Location: [facility] ENDOSCOPY;  Service: Cardiovascular;  Laterality: N/A;     ROS:  As stated in the HPI and negative for all other systems.   PHYSICAL EXAM BP 140/80  Pulse 86  Ht 5\' 6"  (1.676 m)  Wt 141 lb 3.2 oz (64.048 kg)  BMI 22.8 kg/m2 GENERAL:  Well appearing HEENT:  Pupils equal round and reactive, fundi not visualized, oral mucosa unremarkable NECK:  No jugular venous distention, waveform within normal limits, carotid upstroke brisk and symmetric, no bruits, no thyromegaly LYMPHATICS:  No cervical, inguinal adenopathy LUNGS:  Clear to auscultation bilaterally BACK:  No CVA tenderness CHEST:  Status post mastectomy HEART:  PMI not displaced or sustained,S1 and S2 within  normal limits, no S3, no S4, no clicks, no rubs, very focal left axillary murmur midsystolic, no diastolic murmur. ABD:  Flat, positive bowel sounds normal in frequency in pitch, no bruits, no rebound, no guarding, no midline pulsatile mass, no hepatomegaly, no splenomegaly EXT:  2 plus pulses throughout, no edema, no cyanosis no clubbing SKIN:  No rashes no nodules NEURO:  Cranial nerves II through XII grossly intact, motor grossly intact throughout [facility] 352:  Cognitively intact, oriented to person place and time  EKG:  Sinus rhythm, axis WNL, intervals within normal limits, left ventricular hypertrophy by voltage criteria  05/21/2013  ASSESSMENT AND PLAN  MR:  I believe I can follow this with transthoracic echoes. I  will arrange one of these. She currently has no new symptoms.  SYNCOPE:  She has had no symptomatic recurrence of this. No further evaluation is planned.  HTN:  Her blood pressure is typically well controlled. No change in therapy is planned.  FLUTTER:  She has had occasional palpitations but no sustained tachyarrhythmias. No further evaluation or change in therapy is planned.

## 2013-05-21 NOTE — Patient Instructions (Addendum)

## 2013-05-27 ENCOUNTER — Ambulatory Visit (HOSPITAL_COMMUNITY): Payer: BC Managed Care – PPO | Attending: Cardiology | Admitting: Radiology

## 2013-05-27 DIAGNOSIS — I340 Nonrheumatic mitral (valve) insufficiency: Secondary | ICD-10-CM

## 2013-05-27 DIAGNOSIS — C50919 Malignant neoplasm of unspecified site of unspecified female breast: Secondary | ICD-10-CM | POA: Insufficient documentation

## 2013-05-27 DIAGNOSIS — I059 Rheumatic mitral valve disease, unspecified: Secondary | ICD-10-CM

## 2013-05-27 NOTE — Progress Notes (Signed)
Echocardiogram performed.  

## 2013-06-07 ENCOUNTER — Ambulatory Visit: Payer: BC Managed Care – PPO | Admitting: Oncology

## 2013-06-07 ENCOUNTER — Other Ambulatory Visit: Payer: BC Managed Care – PPO | Admitting: Lab

## 2013-06-10 ENCOUNTER — Telehealth: Payer: Self-pay | Admitting: Cardiology

## 2013-06-10 ENCOUNTER — Telehealth: Payer: Self-pay | Admitting: Oncology

## 2013-06-10 NOTE — Telephone Encounter (Signed)
pt called to cancel appt and did not want to r/s

## 2013-06-10 NOTE — Telephone Encounter (Signed)
Spoke with pt who is aware of echo results and to follow up with Dr Antoine Poche in 6 months.  Adjusted recall in system and pt is aware she will receive a letter in the mail to call for an appointment when due.

## 2013-06-10 NOTE — Telephone Encounter (Signed)
New Problem  Pt is returning your call regarding her echo results

## 2013-06-11 ENCOUNTER — Ambulatory Visit: Payer: BC Managed Care – PPO | Admitting: Oncology

## 2013-06-11 ENCOUNTER — Other Ambulatory Visit: Payer: BC Managed Care – PPO | Admitting: Lab

## 2013-06-28 ENCOUNTER — Other Ambulatory Visit: Payer: Self-pay | Admitting: Neurosurgery

## 2013-06-28 DIAGNOSIS — M47817 Spondylosis without myelopathy or radiculopathy, lumbosacral region: Secondary | ICD-10-CM

## 2013-06-29 ENCOUNTER — Ambulatory Visit
Admission: RE | Admit: 2013-06-29 | Discharge: 2013-06-29 | Disposition: A | Discharge status: Home / Self Care | Payer: BC Managed Care – PPO | Source: Ambulatory Visit | Attending: Neurosurgery | Admitting: Neurosurgery

## 2013-06-29 DIAGNOSIS — M47817 Spondylosis without myelopathy or radiculopathy, lumbosacral region: Secondary | ICD-10-CM

## 2013-07-03 ENCOUNTER — Other Ambulatory Visit: Payer: Self-pay | Admitting: Neurosurgery

## 2013-07-03 ENCOUNTER — Other Ambulatory Visit: Payer: BC Managed Care – PPO

## 2013-07-03 DIAGNOSIS — M47816 Spondylosis without myelopathy or radiculopathy, lumbar region: Secondary | ICD-10-CM

## 2013-07-08 ENCOUNTER — Ambulatory Visit
Admission: RE | Admit: 2013-07-08 | Discharge: 2013-07-08 | Disposition: A | Discharge status: Home / Self Care | Payer: BC Managed Care – PPO | Source: Ambulatory Visit | Attending: Neurosurgery | Admitting: Neurosurgery

## 2013-07-08 DIAGNOSIS — M47816 Spondylosis without myelopathy or radiculopathy, lumbar region: Secondary | ICD-10-CM

## 2013-07-08 MED ORDER — METHYLPREDNISOLONE ACETATE 40 MG/ML INJ SUSP (RADIOLOG
120.0000 mg | Freq: Once | INTRAMUSCULAR | Status: AC
  Administered 2013-07-08: 120 mg via EPIDURAL

## 2013-07-08 MED ORDER — IOHEXOL 180 MG/ML  SOLN
1.0000 mL | Freq: Once | INTRAMUSCULAR | Status: AC | PRN
  Administered 2013-07-08: 1 mL via EPIDURAL

## 2013-09-24 ENCOUNTER — Other Ambulatory Visit: Payer: Self-pay | Admitting: Neurosurgery

## 2013-09-24 DIAGNOSIS — M4712 Other spondylosis with myelopathy, cervical region: Secondary | ICD-10-CM

## 2013-09-26 ENCOUNTER — Other Ambulatory Visit: Payer: Self-pay | Admitting: Neurosurgery

## 2013-09-26 ENCOUNTER — Ambulatory Visit
Admission: RE | Admit: 2013-09-26 | Discharge: 2013-09-26 | Disposition: A | Discharge status: Home / Self Care | Payer: BC Managed Care – PPO | Source: Ambulatory Visit | Attending: Neurosurgery | Admitting: Neurosurgery

## 2013-09-26 DIAGNOSIS — M4712 Other spondylosis with myelopathy, cervical region: Secondary | ICD-10-CM

## 2013-09-29 ENCOUNTER — Ambulatory Visit
Admission: RE | Admit: 2013-09-29 | Discharge: 2013-09-29 | Disposition: A | Discharge status: Home / Self Care | Payer: BC Managed Care – PPO | Source: Ambulatory Visit | Attending: Neurosurgery | Admitting: Neurosurgery

## 2013-09-29 DIAGNOSIS — M4712 Other spondylosis with myelopathy, cervical region: Secondary | ICD-10-CM

## 2014-03-18 ENCOUNTER — Ambulatory Visit (INDEPENDENT_AMBULATORY_CARE_PROVIDER_SITE_OTHER): Payer: BC Managed Care – PPO | Admitting: Nurse Practitioner

## 2014-03-18 ENCOUNTER — Encounter: Payer: Self-pay | Admitting: Nurse Practitioner

## 2014-03-18 VITALS — BP 122/60 | HR 90 | Ht 66.0 in | Wt 140.0 lb

## 2014-03-18 DIAGNOSIS — I340 Nonrheumatic mitral (valve) insufficiency: Secondary | ICD-10-CM

## 2014-03-18 DIAGNOSIS — I059 Rheumatic mitral valve disease, unspecified: Secondary | ICD-10-CM

## 2014-03-18 DIAGNOSIS — Z01818 Encounter for other preprocedural examination: Secondary | ICD-10-CM

## 2014-03-18 DIAGNOSIS — R9439 Abnormal result of other cardiovascular function study: Secondary | ICD-10-CM

## 2014-03-18 NOTE — Progress Notes (Signed)
Patricia [name] Date of Birth: 1950/12/19 Medical Record #[id number]  History of Present Illness: Patricia [name] is seen back today for a pre op clearance visit. Seen for Dr. Percival Spanish. She is a 62 year old female with MR in the setting of MVP. TTE showed moderate to severe MR but TEE suggested moderate MR. She has had prior syncope, past atrial flutter ablation, prior breast cancer, and reported abnormal PFTs in the past.  She was last seen by Dr. Percival Spanish in June of 2014. She was felt to be doing well. Echo updated at that time. He has elected to follow her TTE's.   Comes in today. Here with her husband. In a wheelchair. Has a fractured left ankle. She slipped on some pine straw last month - broke her ankle in 2 places. For surgical intervention later this week. She has been fine from a cardiac standpoint. No chest pain. Not short of [name]. Prior to the fall she was very active without any difficulty. No syncope and no dizziness noted. She is anxious to proceed.  She has been using a modified knee scooter at home.  Current Outpatient Prescriptions  Medication Sig Dispense Refill  . alendronate (FOSAMAX) 35 MG tablet Take 35 mg by mouth every 7 (seven) days. Take on Sundays. Take with a full glass of water on an empty stomach.      . Calcium Carbonate (CALCIUM 500 PO) Take 1 tablet by mouth daily.      . cholecalciferol (VITAMIN D) 1000 UNITS tablet Take 2,000 Units by mouth daily.       . ergocalciferol (VITAMIN D2) 50000 UNITS capsule Take 50,000 Units by mouth every 30 (thirty) days. Take on the 1st of every month.      . Esomeprazole Magnesium (NEXIUM PO) Take 40 mg by mouth daily.      Marland Kitchen HYDROcodone-acetaminophen (NORCO) 10-325 MG per tablet Take 1 tablet by mouth as needed.       . latanoprost (XALATAN) 0.005 % ophthalmic solution Place 1 drop into both eyes at bedtime.       . magnesium gluconate (MAGONATE) 500 MG tablet Take 500 mg by mouth daily.      . metoprolol (LOPRESSOR) 50 MG  tablet Take 50 mg by mouth 2 (two) times daily.      . metoprolol (LOPRESSOR) 50 MG tablet TAKE 1 TABLET BY MOUTH TWICE A DAY  60 tablet  6  . Multiple Vitamin (MULTIVITAMIN WITH MINERALS) TABS Take 1 tablet by mouth daily.      . Omega-3 Fatty Acids (FISH OIL) 1200 MG CAPS Take 1,200 mg by mouth daily.       No current facility-administered medications for this visit.    Allergies  Allergen Reactions  . Penicillins Anaphylaxis, Hives and Swelling  . Sulfa Antibiotics Hives and Rash    Past Medical History  Diagnosis Date  . Hodgkin's lymphoma     Treated with radiation and chemo  . Ulcer   . Breast cancer   . Atrial flutter     Ablated 1998 Dr. Caryl Comes    Past Surgical History  Procedure Laterality Date  . Mastectomy      Left  . Laparotomy    . Splenectomy    . Tonsillectomy and adenoidectomy    . Tee without cardioversion  06/12/2012    Procedure: TRANSESOPHAGEAL ECHOCARDIOGRAM (TEE);  Surgeon: Larey [name], MD;  Location: [facility] ENDOSCOPY;  Service: Cardiovascular;  Laterality: N/A;  . Cataract extraction, bilateral  History  Smoking status  . Never Smoker   Smokeless tobacco  . Not on file    History  Alcohol Use: Not on file    Family History  Problem Relation Age of Onset  . Rheumatic fever Father     Died suddenly age 68  . Diabetes Father     Review of Systems: The review of systems is per the HPI.  All other systems were reviewed and are negative.  Physical Exam: BP 122/60  Pulse 90  Ht 5\' 6"  (1.676 m)  Wt 140 lb (63.504 kg)  BMI 22.61 kg/m2 Patient is very pleasant and in no acute distress. She is in a wheelchair. Skin is warm and dry. Color is normal.  HEENT is unremarkable. Normocephalic/atraumatic. PERRL. Sclera are nonicteric. Neck is supple. No masses. No JVD. Lungs are clear. Cardiac exam shows a regular rate and rhythm. Soft murmur noted. Abdomen is soft. She has a cast on the left lower leg. Gait not tested. ROM intact. No gross  neurologic deficits noted.   LABORATORY DATA: EKG today shows sinus rhythm - unchanged from prior tracings.  Lab Results  Component Value Date   WBC 6.6 06/04/2012   HGB 12.9 06/04/2012   HCT 37.8 06/04/2012   PLT 392 06/04/2012   GLUCOSE 103* 06/04/2012   ALT 24 06/04/2012   AST 20 06/04/2012   NA 136 06/04/2012   K 4.6 06/04/2012   CL 105 06/04/2012   CREATININE 0.77 06/04/2012   BUN 15 06/04/2012   CO2 28 06/04/2012   TSH 1.14 04/26/2012   Echo Study Conclusions June 2014  - Left ventricle: The cavity size was normal. Wall thickness was normal. The estimated ejection fraction was 60%. Wall motion was normal; there were no regional wall motion abnormalities. Features are consistent with a pseudonormal left ventricular filling pattern, with concomitant abnormal relaxation and increased filling pressure (grade 2 diastolic dysfunction). E/medial e' > 15 suggests LV end diastolic pressure at least 20 mmHg. - Aortic valve: There was no stenosis. Mild regurgitation. - Mitral valve: Mildly calcified annulus. There was at least moderate and possibly severe eccentric, posteriorly-directed mitral regurgitation. There was no mitral valve prolapse. - Left atrium: The atrium was mildly dilated. - Right ventricle: The cavity size was normal. Systolic function was normal. - Tricuspid valve: Peak RV-RA gradient: 39mm Hg (S). - Pulmonary arteries: PA peak pressure: 49mm Hg (S). - Systemic veins: IVC measured 2.1 cm with some respirophasic variation, suggesting RA pressure 10 mmHg. Impressions:  - Normal LV size and systolic function, EF 16%. Moderate diastolic dysfunction with evidence for elevated LV filling pressure. The mitral valve and annulus were calcified with at least moderate and possibly severe mitral regurgitation. There was no mitral valve prolapse. Would consider TEE to look closer at mitral valve. Normal RV size and systolic function. Moderate pulmonary hypertension.   Notes  Recorded by Minus Breeding, MD on 06/02/2013 at 9:55 PM Her MR is the same as described on the last TTE. Follow up at that time with TEE suggested the MR was moderate. She has no symptoms. I will follow her again in the office in six months. Call Patricia [name] with the results and send results to Tommy Medal, MD  Myoview from June 2013 Overall Impression: Normal stress nuclear study. There is interference from nuclear activity from structures below the diaphragm. This affects the tomographic images somewhat. It creates a slight artifact in the area of the inferolateral wall. I feel that this is not a  significant abnormality. There is no definite scar or ischemia.  LV Ejection Fraction: 71%. LV Wall Motion: Normal Wall Motion  Dola Argyle, MD    Assessment / Plan: 1. Pre operative clearance - she is having no cardiac issues/symptoms - she is an acceptable candidate to proceed with her surgery later this week.  2. Mitral valve disease - regular follow up later this month with Dr. Percival Spanish. Probable repeat echo in the summer.  Patient is agreeable to this plan and will call if any problems develop in the interim.   Burtis Junes, RN, [facility] [address] [place], [place]  [address] [phone]

## 2014-03-18 NOTE — Patient Instructions (Addendum)
Stay on your current medicines  I think you are an acceptable candidate for your surgery.   I will send a note to Dr. Percell Miller  See Dr. Percival Spanish back as planned  Call the [place] office at [phone] if you have any questions, problems or concerns.

## 2014-03-19 ENCOUNTER — Encounter (HOSPITAL_BASED_OUTPATIENT_CLINIC_OR_DEPARTMENT_OTHER): Payer: Self-pay | Admitting: *Deleted

## 2014-03-19 NOTE — Progress Notes (Signed)
Pt saw cardiology 03/18/14 for clearance-ekg done- Will need istat

## 2014-03-19 NOTE — H&P (Signed)
  [facility] [address] [place], [place] [address] [phone] A Division of [facility]  Ninetta Lights, M.D.   Robert A. Noemi Chapel, M.D.   Faythe Casa, M.D.   Johnny Bridge, M.D.   Almedia Balls, M.D Ernesta Amble. Percell Miller, M.D.  Joseph Pierini, M.D.  Lanier Prude, M.D.    Verner Chol, M.D. Mary L. Fenton Malling, PA-C  Kirstin A. Shepperson, PA-C  Josh Danville, PA-C [place], [place]   RE:  Patricia [name], Patricia [name] PROGRESS NOTE: 03-12-14 Reason for visit:  Evaluation left ankle injury. History of present illness: Patricia [name] is a pleasant 63 year old who slipped on pine straw on 03/10/14. She was placed in a splint and is here for follow-up. She has been diligent about elevation and has not had problems with her splint. Pain has been moderately controlled.   Please see associated documentation for this clinic visit for further past medical, family, surgical and social history, review of systems, and exam findings as this was reviewed by me.  EXAMINATION: Well appearing female in no apparent distress. Swelling is minimal at this time she is neurovascularly intact. She has some bruising over her hand but no tenderness or pain with full range of motion of the left hand.   X-RAYS: X-rays reviewed by me: 3 views of the ankle demonstrate bimalleolar ankle fracture there is some comminution medial malleolus.      ASSESSMENT: PLAN: 1. I discussed options at this time, given that she has a bimalleolar ankle fracture unstable at two points I feel this is an unstable ankle fracture and recommend open reduction internal fixation and she would like to go forward with this. 2. I cautioned her that she will spend six weeks non-weightbearing afterwards. 3. Given the comminution on the medial side I think a single screw with tension band to keep from extruding the more proximal piece is the way to go and we will plate the lateral  side prior to that.  Ernesta Amble.  Percell Miller, M.D.  Electronically verified by Ernesta Amble. Percell Miller, M.D. TDM:kah D 03-12-14 T 03-13-14

## 2014-03-21 ENCOUNTER — Ambulatory Visit (HOSPITAL_BASED_OUTPATIENT_CLINIC_OR_DEPARTMENT_OTHER): Payer: BC Managed Care – PPO | Admitting: Certified Registered"

## 2014-03-21 ENCOUNTER — Encounter (HOSPITAL_BASED_OUTPATIENT_CLINIC_OR_DEPARTMENT_OTHER): Payer: Self-pay | Admitting: Certified Registered"

## 2014-03-21 ENCOUNTER — Ambulatory Visit (HOSPITAL_BASED_OUTPATIENT_CLINIC_OR_DEPARTMENT_OTHER)
Admission: RE | Admit: 2014-03-21 | Discharge: 2014-03-21 | Disposition: A | Discharge status: Home / Self Care | Payer: BC Managed Care – PPO | Source: Ambulatory Visit | Attending: Orthopedic Surgery | Admitting: Orthopedic Surgery

## 2014-03-21 ENCOUNTER — Encounter (HOSPITAL_BASED_OUTPATIENT_CLINIC_OR_DEPARTMENT_OTHER)
Admission: RE | Disposition: A | Discharge status: Home / Self Care | Payer: Self-pay | Source: Ambulatory Visit | Attending: Orthopedic Surgery

## 2014-03-21 ENCOUNTER — Encounter (HOSPITAL_BASED_OUTPATIENT_CLINIC_OR_DEPARTMENT_OTHER): Payer: BC Managed Care – PPO | Admitting: Certified Registered"

## 2014-03-21 DIAGNOSIS — I4891 Unspecified atrial fibrillation: Secondary | ICD-10-CM | POA: Insufficient documentation

## 2014-03-21 DIAGNOSIS — I10 Essential (primary) hypertension: Secondary | ICD-10-CM | POA: Insufficient documentation

## 2014-03-21 DIAGNOSIS — Z9221 Personal history of antineoplastic chemotherapy: Secondary | ICD-10-CM | POA: Insufficient documentation

## 2014-03-21 DIAGNOSIS — Z853 Personal history of malignant neoplasm of breast: Secondary | ICD-10-CM | POA: Insufficient documentation

## 2014-03-21 DIAGNOSIS — Z923 Personal history of irradiation: Secondary | ICD-10-CM | POA: Insufficient documentation

## 2014-03-21 DIAGNOSIS — Z8571 Personal history of Hodgkin lymphoma: Secondary | ICD-10-CM | POA: Insufficient documentation

## 2014-03-21 DIAGNOSIS — S82892A Other fracture of left lower leg, initial encounter for closed fracture: Secondary | ICD-10-CM

## 2014-03-21 DIAGNOSIS — W010XXA Fall on same level from slipping, tripping and stumbling without subsequent striking against object, initial encounter: Secondary | ICD-10-CM | POA: Insufficient documentation

## 2014-03-21 DIAGNOSIS — S82843A Displaced bimalleolar fracture of unspecified lower leg, initial encounter for closed fracture: Secondary | ICD-10-CM | POA: Insufficient documentation

## 2014-03-21 HISTORY — PX: ORIF ANKLE FRACTURE: SHX5408

## 2014-03-21 LAB — POCT I-STAT, CHEM 8
BUN: 16 mg/dL (ref 6–23)
Calcium, Ion: 1.18 mmol/L (ref 1.13–1.30)
Chloride: 101 mEq/L (ref 96–112)
Creatinine, Ser: 0.9 mg/dL (ref 0.50–1.10)
Glucose, Bld: 115 mg/dL — ABNORMAL HIGH (ref 70–99)
HCT: 38 % (ref 36.0–46.0)
Hemoglobin: 12.9 g/dL (ref 12.0–15.0)
Potassium: 5.2 mEq/L (ref 3.7–5.3)
Sodium: 140 mEq/L (ref 137–147)
TCO2: 28 mmol/L (ref 0–100)

## 2014-03-21 SURGERY — OPEN REDUCTION INTERNAL FIXATION (ORIF) ANKLE FRACTURE
Anesthesia: General | Site: Ankle | Laterality: Left

## 2014-03-21 MED ORDER — ONDANSETRON HCL 4 MG/2ML IJ SOLN
INTRAMUSCULAR | Status: DC | PRN
  Administered 2014-03-21: 4 mg via INTRAVENOUS

## 2014-03-21 MED ORDER — DEXTROSE-NACL 5-0.45 % IV SOLN: 100.0000 mL/h | INTRAVENOUS | Status: DC

## 2014-03-21 MED ORDER — PROPOFOL 10 MG/ML IV BOLUS
INTRAVENOUS | Status: DC | PRN
  Administered 2014-03-21: 200 mg via INTRAVENOUS

## 2014-03-21 MED ORDER — ACETAMINOPHEN 500 MG PO TABS
ORAL_TABLET | ORAL | Status: AC
  Filled 2014-03-21: qty 2

## 2014-03-21 MED ORDER — ONDANSETRON HCL 4 MG/2ML IJ SOLN: 4.0000 mg | Freq: Once | INTRAMUSCULAR | Status: DC | PRN

## 2014-03-21 MED ORDER — DOCUSATE SODIUM 100 MG PO CAPS: 100.0000 mg | ORAL_CAPSULE | Freq: Two times a day (BID) | ORAL | Status: DC

## 2014-03-21 MED ORDER — OXYCODONE HCL 5 MG PO TABS: 10.0000 mg | ORAL_TABLET | ORAL | Status: DC | PRN

## 2014-03-21 MED ORDER — ROPIVACAINE HCL 5 MG/ML IJ SOLN
INTRAMUSCULAR | Status: DC | PRN
  Administered 2014-03-21: 150 mg via PERINEURAL

## 2014-03-21 MED ORDER — CLINDAMYCIN PHOSPHATE 900 MG/50ML IV SOLN
900.0000 mg | INTRAVENOUS | Status: AC
  Administered 2014-03-21: 900 mg via INTRAVENOUS

## 2014-03-21 MED ORDER — FENTANYL CITRATE 0.05 MG/ML IJ SOLN
50.0000 ug | INTRAMUSCULAR | Status: DC | PRN
  Administered 2014-03-21: 100 ug via INTRAVENOUS

## 2014-03-21 MED ORDER — DEXAMETHASONE SODIUM PHOSPHATE 10 MG/ML IJ SOLN
INTRAMUSCULAR | Status: DC | PRN
  Administered 2014-03-21: 6 mg

## 2014-03-21 MED ORDER — MIDAZOLAM HCL 2 MG/2ML IJ SOLN
INTRAMUSCULAR | Status: AC
  Filled 2014-03-21: qty 2

## 2014-03-21 MED ORDER — CLINDAMYCIN PHOSPHATE 900 MG/50ML IV SOLN
INTRAVENOUS | Status: AC
  Filled 2014-03-21: qty 50

## 2014-03-21 MED ORDER — HYDROMORPHONE HCL PF 1 MG/ML IJ SOLN: 0.2500 mg | INTRAMUSCULAR | Status: DC | PRN

## 2014-03-21 MED ORDER — OXYCODONE HCL 5 MG/5ML PO SOLN: 5.0000 mg | Freq: Once | ORAL | Status: DC | PRN

## 2014-03-21 MED ORDER — LACTATED RINGERS IV SOLN
INTRAVENOUS | Status: DC
  Administered 2014-03-21 (×2): via INTRAVENOUS

## 2014-03-21 MED ORDER — MIDAZOLAM HCL 2 MG/2ML IJ SOLN
1.0000 mg | INTRAMUSCULAR | Status: DC | PRN
  Administered 2014-03-21: 2 mg via INTRAVENOUS

## 2014-03-21 MED ORDER — FENTANYL CITRATE 0.05 MG/ML IJ SOLN
INTRAMUSCULAR | Status: AC
  Filled 2014-03-21: qty 6

## 2014-03-21 MED ORDER — ONDANSETRON HCL 4 MG PO TABS: 4.0000 mg | ORAL_TABLET | Freq: Three times a day (TID) | ORAL | Status: DC | PRN

## 2014-03-21 MED ORDER — FENTANYL CITRATE 0.05 MG/ML IJ SOLN
INTRAMUSCULAR | Status: AC
  Filled 2014-03-21: qty 2

## 2014-03-21 MED ORDER — OXYCODONE HCL 5 MG PO TABS: 5.0000 mg | ORAL_TABLET | Freq: Once | ORAL | Status: DC | PRN

## 2014-03-21 MED ORDER — ACETAMINOPHEN 500 MG PO TABS
1000.0000 mg | ORAL_TABLET | Freq: Once | ORAL | Status: AC
  Administered 2014-03-21: 1000 mg via ORAL

## 2014-03-21 SURGICAL SUPPLY — 76 items
BANDAGE ELASTIC 4 VELCRO ST LF (GAUZE/BANDAGES/DRESSINGS) ×2 IMPLANT
BANDAGE ELASTIC 6 VELCRO ST LF (GAUZE/BANDAGES/DRESSINGS) ×2 IMPLANT
BANDAGE ESMARK 6X9 LF (GAUZE/BANDAGES/DRESSINGS) ×1 IMPLANT
BIT DRILL 2.5X125 (BIT) ×1 IMPLANT
BIT DRILL CANN 2.7 (BIT) ×2
BIT DRILL SRG 2.7XCANN AO CPLG (BIT) IMPLANT
BIT DRL SRG 2.7XCANN AO CPLNG (BIT) ×1
BLADE SURG 15 STRL LF DISP TIS (BLADE) ×2 IMPLANT
BLADE SURG 15 STRL SS (BLADE) ×4
BNDG CMPR 9X6 STRL LF SNTH (GAUZE/BANDAGES/DRESSINGS) ×1
BNDG COHESIVE 4X5 TAN STRL (GAUZE/BANDAGES/DRESSINGS) ×1 IMPLANT
BNDG ESMARK 6X9 LF (GAUZE/BANDAGES/DRESSINGS) ×2
CHLORAPREP W/TINT 26ML (MISCELLANEOUS) ×2 IMPLANT
COVER MAYO STAND STRL (DRAPES) ×2 IMPLANT
COVER TABLE BACK 60X90 (DRAPES) ×2 IMPLANT
CUFF TOURNIQUET SINGLE 24IN (TOURNIQUET CUFF) IMPLANT
CUFF TOURNIQUET SINGLE 34IN LL (TOURNIQUET CUFF) ×1 IMPLANT
DECANTER SPIKE VIAL GLASS SM (MISCELLANEOUS) IMPLANT
DRAPE C-ARM 42X72 X-RAY (DRAPES) IMPLANT
DRAPE C-ARMOR (DRAPES) IMPLANT
DRAPE EXTREMITY T 121X128X90 (DRAPE) ×2 IMPLANT
DRAPE OEC MINIVIEW 54X84 (DRAPES) ×2 IMPLANT
DRAPE U 20/CS (DRAPES) ×2 IMPLANT
DRAPE U-SHAPE 47X51 STRL (DRAPES) ×2 IMPLANT
DRSG EMULSION OIL 3X3 NADH (GAUZE/BANDAGES/DRESSINGS) ×2 IMPLANT
ELECT REM PT RETURN 9FT ADLT (ELECTROSURGICAL) ×2
ELECTRODE REM PT RTRN 9FT ADLT (ELECTROSURGICAL) ×1 IMPLANT
GLOVE BIO SURGEON STRL SZ 6.5 (GLOVE) ×1 IMPLANT
GLOVE BIO SURGEON STRL SZ7.5 (GLOVE) ×2 IMPLANT
GLOVE BIOGEL PI IND STRL 7.0 (GLOVE) IMPLANT
GLOVE BIOGEL PI IND STRL 8 (GLOVE) ×1 IMPLANT
GLOVE BIOGEL PI IND STRL 8.5 (GLOVE) IMPLANT
GLOVE BIOGEL PI INDICATOR 7.0 (GLOVE) ×1
GLOVE BIOGEL PI INDICATOR 8 (GLOVE) ×3
GLOVE BIOGEL PI INDICATOR 8.5 (GLOVE) ×1
GOWN STRL REUS W/ TWL LRG LVL3 (GOWN DISPOSABLE) ×4 IMPLANT
GOWN STRL REUS W/ TWL XL LVL3 (GOWN DISPOSABLE) ×1 IMPLANT
GOWN STRL REUS W/TWL LRG LVL3 (GOWN DISPOSABLE) ×4
GOWN STRL REUS W/TWL XL LVL3 (GOWN DISPOSABLE) ×2
K-WIRE ORTHOPEDIC 1.4X150L (WIRE) ×2
KWIRE ORTHOPEDIC 1.4X150L (WIRE) IMPLANT
NEEDLE HYPO 22GX1.5 SAFETY (NEEDLE) IMPLANT
NS IRRIG 1000ML POUR BTL (IV SOLUTION) ×2 IMPLANT
PACK BASIN DAY SURGERY FS (CUSTOM PROCEDURE TRAY) ×2 IMPLANT
PAD CAST 4YDX4 CTTN HI CHSV (CAST SUPPLIES) ×1 IMPLANT
PADDING CAST COTTON 4X4 STRL (CAST SUPPLIES) ×2
PADDING CAST COTTON 6X4 STRL (CAST SUPPLIES) ×2 IMPLANT
PENCIL BUTTON HOLSTER BLD 10FT (ELECTRODE) ×2 IMPLANT
PLATE TUBUAL 1/3 6H (Plate) ×1 IMPLANT
SCREW CANC 2.5XFT HEX12X4X (Screw) IMPLANT
SCREW CANCELLOUS 4.0X12MM (Screw) ×4 IMPLANT
SCREW CANN 44X15X4XSLF DRL (Screw) IMPLANT
SCREW CANNULATED 4.0X44MM (Screw) ×2 IMPLANT
SCREW CORT 10X3.5XST NS LF (Screw) IMPLANT
SCREW CORTEX ST MATTA 3.5X12MM (Screw) ×2 IMPLANT
SCREW CORTEX ST MATTA 3.5X14 (Screw) ×1 IMPLANT
SCREW CORTEX ST MATTA 3.5X16MM (Screw) ×1 IMPLANT
SCREW CORTICAL 3.5 (Screw) ×2 IMPLANT
SLEEVE SCD COMPRESS KNEE MED (MISCELLANEOUS) IMPLANT
SPLINT FAST PLASTER 5X30 (CAST SUPPLIES)
SPLINT PLASTER CAST FAST 5X30 (CAST SUPPLIES) IMPLANT
SPONGE GAUZE 4X4 12PLY (GAUZE/BANDAGES/DRESSINGS) ×2 IMPLANT
SPONGE LAP 4X18 X RAY DECT (DISPOSABLE) ×2 IMPLANT
STRIP CLOSURE SKIN 1/2X4 (GAUZE/BANDAGES/DRESSINGS) ×2 IMPLANT
SUCTION FRAZIER TIP 10 FR DISP (SUCTIONS) ×1 IMPLANT
SUT MON AB 2-0 CT1 36 (SUTURE) ×2 IMPLANT
SUT MON AB 4-0 PC3 18 (SUTURE) ×2 IMPLANT
SUT VIC AB 0 SH 27 (SUTURE) IMPLANT
SUT VIC AB 2-0 SH 27 (SUTURE)
SUT VIC AB 2-0 SH 27XBRD (SUTURE) IMPLANT
SYR 20CC LL (SYRINGE) ×2 IMPLANT
SYR BULB 3OZ (MISCELLANEOUS) ×2 IMPLANT
TOWEL OR 17X24 6PK STRL BLUE (TOWEL DISPOSABLE) ×4 IMPLANT
TOWEL OR NON WOVEN STRL DISP B (DISPOSABLE) ×2 IMPLANT
TUBE CONNECTING 20X1/4 (TUBING) ×2 IMPLANT
UNDERPAD 30X30 INCONTINENT (UNDERPADS AND DIAPERS) ×2 IMPLANT

## 2014-03-21 NOTE — Anesthesia Postprocedure Evaluation (Signed)
  Anesthesia Post-op Note  Patient: Patricia [name]  Procedure(s) Performed: Procedure(s): LEFT ANKLE FRACTURE OPEN TREATMENT BILMALLEOLAR INCLUDES INTERNAL FIXATION  (Left)  Patient Location: PACU  Anesthesia Type:GA combined with regional for post-op pain  Level of Consciousness: awake, alert , oriented and patient cooperative  Airway and Oxygen Therapy: Patient Spontanous Breathing  Post-op Pain: none  Post-op Assessment: Post-op Vital signs reviewed, Patient's Cardiovascular Status Stable, Respiratory Function Stable, Patent Airway, No signs of Nausea or vomiting and Pain level controlled  Post-op Vital Signs: Reviewed and stable  Last Vitals:  Filed Vitals:   03/21/14 1720  BP: 131/43  Pulse: 83  Temp: 36.4 C  Resp: 20    Complications: No apparent anesthesia complications

## 2014-03-21 NOTE — Progress Notes (Signed)
Assisted Dr. Singer with left, ultrasound guided, popliteal/saphenous block. Side rails up, monitors on throughout procedure. See vital signs in flow sheet. Tolerated Procedure well. 

## 2014-03-21 NOTE — Anesthesia Preprocedure Evaluation (Addendum)
Anesthesia Evaluation  Patient identified by MRN, date of birth, ID band Patient awake    Reviewed: Allergy & Precautions, H&P , NPO status , Patient's Chart, lab work & pertinent test results, reviewed documented beta blocker date and time   Airway Mallampati: I TM Distance: >3 FB Neck ROM: Full    Dental  (+) Teeth Intact, Dental Advisory Given   Pulmonary  breath sounds clear to auscultation        Cardiovascular hypertension, Pt. on home beta blockers and Pt. on medications + dysrhythmias (resolved with ablation) Atrial Fibrillation Rhythm:Regular Rate:Normal  '13 TEE: EF 55-60%, mod MR   Neuro/Psych    GI/Hepatic   Endo/Other    Renal/GU      Musculoskeletal   Abdominal   Peds  Hematology   Anesthesia Other Findings H/o Hodgkin's lymphoma: chemo and XRT H/o breast cancer  Reproductive/Obstetrics                          Anesthesia Physical Anesthesia Plan  ASA: II  Anesthesia Plan: General   Post-op Pain Management:    Induction: Intravenous  Airway Management Planned: LMA  Additional Equipment:   Intra-op Plan:   Post-operative Plan: Extubation in OR  Informed Consent: I have reviewed the patients History and Physical, chart, labs and discussed the procedure including the risks, benefits and alternatives for the proposed anesthesia with the patient or authorized representative who has indicated his/her understanding and acceptance.   Dental advisory given  Plan Discussed with: CRNA, Anesthesiologist and Surgeon  Anesthesia Plan Comments:         Anesthesia Quick Evaluation

## 2014-03-21 NOTE — Op Note (Signed)
03/21/2014  4:24 PM  PATIENT:  Patricia [name]    PRE-OPERATIVE DIAGNOSIS:  LEFT ANKLE FRACTURE BIMALLEOLAR CLOSED   POST-OPERATIVE DIAGNOSIS:  Same  PROCEDURE:  LEFT ANKLE FRACTURE OPEN TREATMENT BILMALLEOLAR INCLUDES INTERNAL FIXATION   SURGEON:  Renette Butters, MD  ASSISTANT: Joya Gaskins OPA  ANESTHESIA:   General  PREOPERATIVE INDICATIONS:  Patricia [name] is a  63 y.o. female with a diagnosis of LEFT ANKLE FRACTURE BIMALLEOLAR CLOSED  who failed conservative measures and elected for surgical management.    The risks benefits and alternatives were discussed with the patient preoperatively including but not limited to the risks of infection, bleeding, nerve injury, cardiopulmonary complications, the need for revision surgery, among others, and the patient was willing to proceed.  OPERATIVE IMPLANTS: Stryker  OPERATIVE FINDINGS: Unstable ankle fracture. Stable syndesmosis post op  BLOOD LOSS: 20  COMPLICATIONS: none  TOURNIQUET TIME: 52min  OPERATIVE PROCEDURE:  Patient was identified in the preoperative holding area and site was marked by me He was transported to the operating theater and placed on the table in supine position taking care to pad all bony prominences. After a preincinduction time out anesthesia was induced. The left lower extremity was prepped and draped in normal sterile fashion and a pre-incision timeout was performed. Patricia [name] received clinda for preoperative antibiotics.   I made a lateral incision of roughly 7 cm dissection was carried down sharply to the distal fibula and then spreading dissection was used proximally to protect the superficial peroneal nerve. I sharply incised the periosteum and took care to protect the peroneal tendons. I then debrided the fracture site and performed a reduction maneuver which was held in place with a clamp.   I then selected a 6-hole one third tubular plate and placed in a neutralization fashion care was  taken distally so as not to penetrate the joint with the cancellus screws.  I then turned my attention medially where I created a 4 cm incision and dissected sharply down to the medial Mal fracture taking care to protect the saphenous vein. I debrided the fracture and reduced and held in place with a tenaculum. There was comminution of her medial Mal fracture as concerned for extrusion of the segmental piece for this reason I elected to place a single 44 mm threaded partially threaded cancellus screw with a #2 FiberWire tension band. This held the reduction well.  I then stressed the syndesmosis and it was stable  The wound was then thoroughly irrigated and closed using a 0 Vicryl and absorbable Monocryl sutures. He was placed in a short leg splint.   POST OPERATIVE PLAN: Non-weightbearing. DVT prophylaxis will consist of early mobility and foot pumps given her history of a GI bleed on aspirin

## 2014-03-21 NOTE — Interval H&P Note (Signed)
History and Physical Interval Note:  03/21/2014 8:35 AM  [name]  has presented today for surgery, with the diagnosis of LEFT ANKLE FRACTURE BIMALLEOLAR CLOSED   The various methods of treatment have been discussed with the patient and family. After consideration of risks, benefits and other options for treatment, the patient has consented to  Procedure(s): LEFT ANKLE FRACTURE OPEN TREATMENT [place]  (Left) as a surgical intervention .  The patient's history has been reviewed, patient examined, no change in status, stable for surgery.  I have reviewed the patient's chart and labs.  Questions were answered to the patient's satisfaction.     Patricia [name]

## 2014-03-21 NOTE — Anesthesia Procedure Notes (Addendum)
Anesthesia Regional Block:  Popliteal block  Pre-Anesthetic Checklist: ,, timeout performed, Correct Patient, Correct Site, Correct Laterality, Correct Procedure, Correct Position, site marked, Risks and benefits discussed,  Surgical consent,  Pre-op evaluation,  At surgeon's request and post-op pain management  Laterality: Left  Prep: chloraprep       Needles:  Injection technique: Single-shot  Needle Type: Echogenic Stimulator Needle          Additional Needles:  Procedures: ultrasound guided (picture in chart) and nerve stimulator Popliteal block  Nerve Stimulator or Paresthesia:  Response: plantar flexion, 0.45 mA,   Additional Responses:   Narrative:  Start time: 03/21/2014 1:46 PM End time: 03/21/2014 1:56 PM Injection made incrementally with aspirations every 5 mL.  Performed by: Personally  Anesthesiologist: J. Tamela Gammon, MD  Additional Notes: A functioning IV was confirmed and monitors were applied.  Sterile prep and drape, hand hygiene and sterile gloves were used.  Negative aspiration and test dose prior to incremental administration of local anesthetic. The patient tolerated the procedure well.Ultrasound  guidance: relevant anatomy identified, needle position confirmed, local anesthetic spread visualized around nerve(s), vascular puncture avoided.  Image printed for medical record.    Procedure Name: LMA Insertion Date/Time: 03/21/2014 2:53 PM Performed by: Baxter Flattery Pre-anesthesia Checklist: Patient identified, Emergency Drugs available, Suction available and Patient being monitored Patient Re-evaluated:Patient Re-evaluated prior to inductionOxygen Delivery Method: Circle System Utilized Preoxygenation: Pre-oxygenation with 100% oxygen Intubation Type: IV induction Ventilation: Mask ventilation without difficulty LMA: LMA inserted LMA Size: 4.0 Number of attempts: 1 Airway Equipment and Method: bite block Placement Confirmation: positive ETCO2 and  breath sounds checked- equal and bilateral Tube secured with: Tape Dental Injury: Teeth and Oropharynx as per pre-operative assessment

## 2014-03-21 NOTE — Transfer of Care (Signed)
Immediate Anesthesia Transfer of Care Note  Patient: Patricia [name]  Procedure(s) Performed: Procedure(s): LEFT ANKLE FRACTURE OPEN TREATMENT BILMALLEOLAR INCLUDES INTERNAL FIXATION  (Left)  Patient Location: PACU  Anesthesia Type:GA combined with regional for post-op pain  Level of Consciousness: awake, alert , oriented and patient cooperative  Airway & Oxygen Therapy: Patient Spontanous Breathing and Patient connected to face mask oxygen  Post-op Assessment: Report given to PACU RN and Post -op Vital signs reviewed and stable  Post vital signs: Reviewed and stable  Complications: No apparent anesthesia complications

## 2014-03-21 NOTE — Discharge Instructions (Signed)
Elevate your leg as much as possible  No weight on yoru left leg   Post Anesthesia Home Care Instructions  Activity: Get plenty of rest for the remainder of the day. A responsible adult should stay with you for 24 hours following the procedure.  For the next 24 hours, DO NOT: -Drive a car -Paediatric nurse -Drink alcoholic beverages -Take any medication unless instructed by your physician -Make any legal decisions or sign important papers.  Meals: Start with liquid foods such as gelatin or soup. Progress to regular foods as tolerated. Avoid greasy, spicy, heavy foods. If nausea and/or vomiting occur, drink only clear liquids until the nausea and/or vomiting subsides. Call your physician if vomiting continues.  Special Instructions/Symptoms: Your throat may feel dry or sore from the anesthesia or the breathing tube placed in your throat during surgery. If this causes discomfort, gargle with warm salt water. The discomfort should disappear within 24 hours. Regional Anesthesia Blocks  1. Numbness or the inability to move the "blocked" extremity may last from 3-48 hours after placement. The length of time depends on the medication injected and your individual response to the medication. If the numbness is not going away after 48 hours, call your surgeon.  2. The extremity that is blocked will need to be protected until the numbness is gone and the  Strength has returned. Because you cannot feel it, you will need to take extra care to avoid injury. Because it may be weak, you may have difficulty moving it or using it. You may not know what position it is in without looking at it while the block is in effect.  3. For blocks in the legs and feet, returning to weight bearing and walking needs to be done carefully. You will need to wait until the numbness is entirely gone and the strength has returned. You should be able to move your leg and foot normally before you try and bear weight or walk.  You will need someone to be with you when you first try to ensure you do not fall and possibly risk injury.  4. Bruising and tenderness at the needle site are common side effects and will resolve in a few days.  5. Persistent numbness or new problems with movement should be communicated to the surgeon or the [facility] [phone] [place] [phone]).

## 2014-03-27 ENCOUNTER — Ambulatory Visit: Payer: BC Managed Care – PPO | Admitting: Cardiology

## 2014-03-31 ENCOUNTER — Encounter (HOSPITAL_BASED_OUTPATIENT_CLINIC_OR_DEPARTMENT_OTHER): Payer: Self-pay | Admitting: Orthopedic Surgery

## 2014-04-02 ENCOUNTER — Other Ambulatory Visit: Payer: Self-pay | Admitting: *Deleted

## 2014-04-02 MED ORDER — METOPROLOL TARTRATE 50 MG PO TABS: ORAL_TABLET | ORAL | Status: DC

## 2014-04-10 ENCOUNTER — Ambulatory Visit: Payer: BC Managed Care – PPO | Admitting: Cardiology

## 2014-05-09 ENCOUNTER — Ambulatory Visit (INDEPENDENT_AMBULATORY_CARE_PROVIDER_SITE_OTHER): Payer: BC Managed Care – PPO | Admitting: Cardiology

## 2014-05-09 ENCOUNTER — Encounter: Payer: Self-pay | Admitting: Cardiology

## 2014-05-09 VITALS — BP 113/54 | HR 86 | Ht 66.0 in | Wt 135.0 lb

## 2014-05-09 DIAGNOSIS — I340 Nonrheumatic mitral (valve) insufficiency: Secondary | ICD-10-CM

## 2014-05-09 DIAGNOSIS — R55 Syncope and collapse: Secondary | ICD-10-CM

## 2014-05-09 DIAGNOSIS — I10 Essential (primary) hypertension: Secondary | ICD-10-CM

## 2014-05-09 DIAGNOSIS — I059 Rheumatic mitral valve disease, unspecified: Secondary | ICD-10-CM

## 2014-05-09 NOTE — Patient Instructions (Addendum)
The current medical regimen is effective;  continue present plan and medications.  Your physician has requested that you have an echocardiogram in June. Echocardiography is a painless test that uses sound waves to create images of your heart. It provides your doctor with information about the size and shape of your heart and how well your heart's chambers and valves are working. This procedure takes approximately one hour. There are no restrictions for this procedure.  Follow up in 1 year with Dr Percival Spanish at the [facility] office.  You will receive a letter in the mail 2 months before you are due.  Please call us when you receive this letter to schedule your follow up appointment.

## 2014-05-09 NOTE — Progress Notes (Signed)
HPI The patient presents for evaluation of mitral regurgitation. Transthoracic echo demonstrated moderate to severe mitral regurgitation with mitral valve prolapse. However, followup TEE suggested the MR to be moderate. She also had a history of flutter ablated.    She was seen by Mrs. Servando Snare recently  Prior to having surgery on a broken left leg. She did well with this.  The patient denies any new symptoms such as chest discomfort, neck or arm discomfort. There has been no new shortness of breath, PND or orthopnea.   She does have rare palpitations unchanged from previous.    Allergies  Allergen Reactions  . Penicillins Anaphylaxis, Hives and Swelling  . Sulfa Antibiotics Hives and Rash    Current Outpatient Prescriptions  Medication Sig Dispense Refill  . alendronate (FOSAMAX) 35 MG tablet Take 35 mg by mouth every 7 (seven) days. Take on Sundays. Take with a full glass of water on an empty stomach.      . Calcium Carbonate (CALCIUM 500 PO) Take 1 tablet by mouth daily.      . cholecalciferol (VITAMIN D) 1000 UNITS tablet Take 2,000 Units by mouth daily.       . ergocalciferol (VITAMIN D2) 50000 UNITS capsule Take 50,000 Units by mouth every 30 (thirty) days. Take on the 1st of every month.      . Esomeprazole Magnesium (NEXIUM PO) Take 40 mg by mouth daily.      Marland Kitchen latanoprost (XALATAN) 0.005 % ophthalmic solution Place 1 drop into both eyes at bedtime.       . magnesium gluconate (MAGONATE) 500 MG tablet Take 500 mg by mouth daily.      . metoprolol (LOPRESSOR) 50 MG tablet TAKE 1 TABLET BY MOUTH TWICE A DAY  60 tablet  3  . Multiple Vitamin (MULTIVITAMIN WITH MINERALS) TABS Take 1 tablet by mouth daily.      . Omega-3 Fatty Acids (FISH OIL) 1200 MG CAPS Take 1,200 mg by mouth daily.       No current facility-administered medications for this visit.    Past Medical History  Diagnosis Date  . Hodgkin's lymphoma     Treated with radiation and chemo  . Ulcer   . Breast cancer     . Atrial flutter     Ablated 1998 Dr. Caryl Comes    Past Surgical History  Procedure Laterality Date  . Laparotomy    . Splenectomy  1974    hodgkins disease  . Tonsillectomy and adenoidectomy    . Tee without cardioversion  06/12/2012    Procedure: TRANSESOPHAGEAL ECHOCARDIOGRAM (TEE);  Surgeon: Larey [name], MD;  Location: [facility] ENDOSCOPY;  Service: Cardiovascular;  Laterality: N/A;  . Cataract extraction, bilateral    . Mastectomy  2005    Left-axillary  . Colonoscopy    . Cardiac electrophysiology mapping and ablation  1999  . Orif ankle fracture Left 03/21/2014    Procedure: LEFT ANKLE FRACTURE OPEN TREATMENT BILMALLEOLAR INCLUDES INTERNAL FIXATION ;  Surgeon: Renette Butters, MD;  Location: [place];  Service: Orthopedics;  Laterality: Left;     ROS:  As stated in the HPI and negative for all other systems.   PHYSICAL EXAM BP 113/54  Pulse 86  Ht 5\' 6"  (1.676 m)  Wt 135 lb (61.236 kg)  BMI 21.80 kg/m2 GEN:  No distress NECK:  No jugular venous distention at 90 degrees, waveform within normal limits, carotid upstroke brisk and symmetric, no bruits, no thyromegaly LUNGS:  Clear to  auscultation bilaterally BACK:  No CVA tenderness HEART:  S1 and S2 within normal limits, no S3, no S4, no clicks, no rubs, I do not hear the systolic murmur this year as she is seated.  (It was previously a very focal murmur).  ABD:  Positive bowel sounds normal in frequency in pitch, no bruits, no rebound, no guarding, unable to assess midline mass or bruit with the patient seated. EXT:  2 plus pulses throughout, moderate edema, no cyanosis no clubbing    ASSESSMENT AND PLAN  MR:  I believe I can follow this with transthoracic echoes. I will arrange one of these. She currently has no new symptoms.  I will likely see her yearly.    FLUTTER:  She has had occasional palpitations but no sustained tachyarrhythmias. No further evaluation or change in therapy is planned.

## 2014-05-14 ENCOUNTER — Ambulatory Visit (HOSPITAL_COMMUNITY): Payer: BC Managed Care – PPO | Attending: Cardiology | Admitting: Radiology

## 2014-05-14 DIAGNOSIS — I340 Nonrheumatic mitral (valve) insufficiency: Secondary | ICD-10-CM

## 2014-05-14 DIAGNOSIS — Z853 Personal history of malignant neoplasm of breast: Secondary | ICD-10-CM | POA: Insufficient documentation

## 2014-05-14 DIAGNOSIS — I379 Nonrheumatic pulmonary valve disorder, unspecified: Secondary | ICD-10-CM | POA: Insufficient documentation

## 2014-05-14 DIAGNOSIS — I059 Rheumatic mitral valve disease, unspecified: Secondary | ICD-10-CM

## 2014-05-14 DIAGNOSIS — I359 Nonrheumatic aortic valve disorder, unspecified: Secondary | ICD-10-CM | POA: Insufficient documentation

## 2014-05-14 DIAGNOSIS — R000 Tachycardia, unspecified: Secondary | ICD-10-CM | POA: Insufficient documentation

## 2014-05-14 DIAGNOSIS — I079 Rheumatic tricuspid valve disease, unspecified: Secondary | ICD-10-CM | POA: Insufficient documentation

## 2014-05-14 DIAGNOSIS — R55 Syncope and collapse: Secondary | ICD-10-CM | POA: Insufficient documentation

## 2014-05-14 NOTE — Progress Notes (Signed)
Echocardiogram performed.  

## 2014-08-14 ENCOUNTER — Other Ambulatory Visit: Payer: Self-pay | Admitting: *Deleted

## 2014-08-14 MED ORDER — METOPROLOL TARTRATE 50 MG PO TABS: ORAL_TABLET | ORAL | Status: DC

## 2014-12-24 ENCOUNTER — Emergency Department (HOSPITAL_COMMUNITY): Payer: BC Managed Care – PPO

## 2014-12-24 ENCOUNTER — Encounter (HOSPITAL_COMMUNITY): Payer: Self-pay | Admitting: *Deleted

## 2014-12-24 ENCOUNTER — Inpatient Hospital Stay (HOSPITAL_COMMUNITY)
Admission: EM | Admit: 2014-12-24 | Discharge: 2015-01-07 | DRG: 438 | Disposition: A | Discharge status: Home / Self Care | Payer: BC Managed Care – PPO | Attending: Internal Medicine | Admitting: Internal Medicine

## 2014-12-24 DIAGNOSIS — N179 Acute kidney failure, unspecified: Secondary | ICD-10-CM | POA: Insufficient documentation

## 2014-12-24 DIAGNOSIS — Z9081 Acquired absence of spleen: Secondary | ICD-10-CM | POA: Diagnosis present

## 2014-12-24 DIAGNOSIS — K859 Acute pancreatitis without necrosis or infection, unspecified: Secondary | ICD-10-CM | POA: Diagnosis present

## 2014-12-24 DIAGNOSIS — Z880 Allergy status to penicillin: Secondary | ICD-10-CM

## 2014-12-24 DIAGNOSIS — R109 Unspecified abdominal pain: Secondary | ICD-10-CM

## 2014-12-24 DIAGNOSIS — Z8571 Personal history of Hodgkin lymphoma: Secondary | ICD-10-CM

## 2014-12-24 DIAGNOSIS — I10 Essential (primary) hypertension: Secondary | ICD-10-CM | POA: Diagnosis present

## 2014-12-24 DIAGNOSIS — K868 Other specified diseases of pancreas: Secondary | ICD-10-CM | POA: Diagnosis present

## 2014-12-24 DIAGNOSIS — R6510 Systemic inflammatory response syndrome (SIRS) of non-infectious origin without acute organ dysfunction: Secondary | ICD-10-CM | POA: Diagnosis present

## 2014-12-24 DIAGNOSIS — R197 Diarrhea, unspecified: Secondary | ICD-10-CM | POA: Insufficient documentation

## 2014-12-24 DIAGNOSIS — K219 Gastro-esophageal reflux disease without esophagitis: Secondary | ICD-10-CM | POA: Diagnosis present

## 2014-12-24 DIAGNOSIS — I4892 Unspecified atrial flutter: Secondary | ICD-10-CM | POA: Diagnosis present

## 2014-12-24 DIAGNOSIS — K863 Pseudocyst of pancreas: Secondary | ICD-10-CM | POA: Diagnosis present

## 2014-12-24 DIAGNOSIS — D649 Anemia, unspecified: Secondary | ICD-10-CM | POA: Diagnosis present

## 2014-12-24 DIAGNOSIS — I4891 Unspecified atrial fibrillation: Secondary | ICD-10-CM | POA: Diagnosis present

## 2014-12-24 DIAGNOSIS — E8770 Fluid overload, unspecified: Secondary | ICD-10-CM | POA: Diagnosis present

## 2014-12-24 DIAGNOSIS — H409 Unspecified glaucoma: Secondary | ICD-10-CM | POA: Diagnosis present

## 2014-12-24 DIAGNOSIS — R0602 Shortness of breath: Secondary | ICD-10-CM

## 2014-12-24 DIAGNOSIS — R1013 Epigastric pain: Secondary | ICD-10-CM | POA: Insufficient documentation

## 2014-12-24 DIAGNOSIS — J9601 Acute respiratory failure with hypoxia: Secondary | ICD-10-CM

## 2014-12-24 DIAGNOSIS — C8190 Hodgkin lymphoma, unspecified, unspecified site: Secondary | ICD-10-CM

## 2014-12-24 DIAGNOSIS — R739 Hyperglycemia, unspecified: Secondary | ICD-10-CM | POA: Diagnosis present

## 2014-12-24 DIAGNOSIS — T50905A Adverse effect of unspecified drugs, medicaments and biological substances, initial encounter: Secondary | ICD-10-CM | POA: Diagnosis present

## 2014-12-24 DIAGNOSIS — K5909 Other constipation: Secondary | ICD-10-CM | POA: Diagnosis present

## 2014-12-24 DIAGNOSIS — J90 Pleural effusion, not elsewhere classified: Secondary | ICD-10-CM | POA: Diagnosis present

## 2014-12-24 DIAGNOSIS — Z9010 Acquired absence of unspecified breast and nipple: Secondary | ICD-10-CM | POA: Diagnosis present

## 2014-12-24 DIAGNOSIS — D72829 Elevated white blood cell count, unspecified: Secondary | ICD-10-CM | POA: Diagnosis present

## 2014-12-24 DIAGNOSIS — Z8711 Personal history of peptic ulcer disease: Secondary | ICD-10-CM

## 2014-12-24 DIAGNOSIS — Z853 Personal history of malignant neoplasm of breast: Secondary | ICD-10-CM

## 2014-12-24 DIAGNOSIS — R0600 Dyspnea, unspecified: Secondary | ICD-10-CM

## 2014-12-24 DIAGNOSIS — E876 Hypokalemia: Secondary | ICD-10-CM | POA: Diagnosis present

## 2014-12-24 DIAGNOSIS — E871 Hypo-osmolality and hyponatremia: Secondary | ICD-10-CM | POA: Insufficient documentation

## 2014-12-24 DIAGNOSIS — Z923 Personal history of irradiation: Secondary | ICD-10-CM

## 2014-12-24 LAB — CBC WITH DIFFERENTIAL/PLATELET
Basophils Absolute: 0 10*3/uL (ref 0.0–0.1)
Basophils Relative: 0 % (ref 0–1)
Eosinophils Absolute: 0 10*3/uL (ref 0.0–0.7)
Eosinophils Relative: 0 % (ref 0–5)
HCT: 42.6 % (ref 36.0–46.0)
Hemoglobin: 14.3 g/dL (ref 12.0–15.0)
Lymphocytes Relative: 3 % — ABNORMAL LOW (ref 12–46)
Lymphs Abs: 1 10*3/uL (ref 0.7–4.0)
MCH: 30 pg (ref 26.0–34.0)
MCHC: 33.6 g/dL (ref 30.0–36.0)
MCV: 89.3 fL (ref 78.0–100.0)
Monocytes Absolute: 1.6 10*3/uL — ABNORMAL HIGH (ref 0.1–1.0)
Monocytes Relative: 5 % (ref 3–12)
Neutro Abs: 30.2 10*3/uL — ABNORMAL HIGH (ref 1.7–7.7)
Neutrophils Relative %: 92 % — ABNORMAL HIGH (ref 43–77)
Platelets: 558 10*3/uL — ABNORMAL HIGH (ref 150–400)
RBC: 4.77 MIL/uL (ref 3.87–5.11)
RDW: 14.6 % (ref 11.5–15.5)
WBC: 32.8 10*3/uL — ABNORMAL HIGH (ref 4.0–10.5)

## 2014-12-24 LAB — COMPREHENSIVE METABOLIC PANEL
ALT: 26 U/L (ref 0–35)
AST: 28 U/L (ref 0–37)
Albumin: 4.3 g/dL (ref 3.5–5.2)
Alkaline Phosphatase: 79 U/L (ref 39–117)
Anion gap: 9 (ref 5–15)
BUN: 18 mg/dL (ref 6–23)
CO2: 29 mmol/L (ref 19–32)
Calcium: 9.1 mg/dL (ref 8.4–10.5)
Chloride: 99 mEq/L (ref 96–112)
Creatinine, Ser: 0.76 mg/dL (ref 0.50–1.10)
GFR calc Af Amer: >90 mL/min (ref >90)
GFR calc non Af Amer: 87 mL/min — ABNORMAL LOW (ref >90)
Glucose, Bld: 176 mg/dL — ABNORMAL HIGH (ref 70–99)
Potassium: 4.5 mmol/L (ref 3.5–5.1)
Sodium: 137 mmol/L (ref 135–145)
Total Bilirubin: 0.8 mg/dL (ref 0.3–1.2)
Total Protein: 6.9 g/dL (ref 6.0–8.3)

## 2014-12-24 LAB — LIPASE, BLOOD: Lipase: 1170 U/L — ABNORMAL HIGH (ref 11–59)

## 2014-12-24 LAB — I-STAT CG4 LACTIC ACID, ED: Lactic Acid, Venous: 1.53 mmol/L (ref 0.5–2.2)

## 2014-12-24 LAB — I-STAT TROPONIN, ED: Troponin i, poc: 0 ng/mL (ref 0.00–0.08)

## 2014-12-24 MED ORDER — SODIUM CHLORIDE 0.9 % IV BOLUS (SEPSIS)
1000.0000 mL | Freq: Once | INTRAVENOUS | Status: AC
  Administered 2014-12-24: 1000 mL via INTRAVENOUS

## 2014-12-24 MED ORDER — ONDANSETRON HCL 4 MG/2ML IJ SOLN
4.0000 mg | Freq: Once | INTRAMUSCULAR | Status: AC
  Administered 2014-12-24: 4 mg via INTRAVENOUS
  Filled 2014-12-24: qty 2

## 2014-12-24 MED ORDER — HYDROMORPHONE HCL 1 MG/ML IJ SOLN
1.0000 mg | Freq: Once | INTRAMUSCULAR | Status: AC
  Administered 2014-12-24: 1 mg via INTRAVENOUS
  Filled 2014-12-24: qty 1

## 2014-12-24 MED ORDER — IOHEXOL 300 MG/ML  SOLN
100.0000 mL | Freq: Once | INTRAMUSCULAR | Status: AC | PRN
  Administered 2014-12-24: 100 mL via INTRAVENOUS

## 2014-12-24 NOTE — ED Provider Notes (Addendum)
CSN: [id number]     Arrival date & time 12/24/14  2025 History   First MD Initiated Contact with Patient 12/24/14 2131     Chief Complaint  Patient presents with  . Abdominal Pain     Patient is a 64 y.o. female presenting with abdominal pain. The history is provided by the patient. No language interpreter was used.  Abdominal Pain  Patricia [name] presents for the evaluation of abdominal pain. She ate [employer] for lunch and around 3 PM she developed abdominal cramping that is predominantly over the upper abdomen. She's had 6 episodes of vomiting. She denies any diarrhea but did have a normal bowel movement today. She denies any fevers but does have chills. She has no known sick contacts. No dysuria. She's had a history of laparotomy for Hodgkin's lymphoma.  Past Medical History  Diagnosis Date  . Hodgkin's lymphoma     Treated with radiation and chemo  . Ulcer   . Breast cancer   . Atrial flutter     Ablated 1998 Dr. Caryl Comes   Past Surgical History  Procedure Laterality Date  . Laparotomy    . Splenectomy  1974    hodgkins disease  . Tonsillectomy and adenoidectomy    . Tee without cardioversion  06/12/2012    Procedure: TRANSESOPHAGEAL ECHOCARDIOGRAM (TEE);  Surgeon: Larey [name], MD;  Location: [facility] ENDOSCOPY;  Service: Cardiovascular;  Laterality: N/A;  . Cataract extraction, bilateral    . Mastectomy  2005    Left-axillary  . Colonoscopy    . Cardiac electrophysiology mapping and ablation  1999  . Orif ankle fracture Left 03/21/2014    Procedure: LEFT ANKLE FRACTURE OPEN TREATMENT BILMALLEOLAR INCLUDES INTERNAL FIXATION ;  Surgeon: Renette Butters, MD;  Location: [place];  Service: Orthopedics;  Laterality: Left;   Family History  Problem Relation Age of Onset  . Rheumatic fever Father     Died suddenly age 77  . Diabetes Father    History  Substance Use Topics  . Smoking status: Never Smoker   . Smokeless tobacco: Not on file  . Alcohol Use: No    OB History    No data available     Review of Systems  Gastrointestinal: Positive for abdominal pain.  All other systems reviewed and are negative.     Allergies  Penicillins and Sulfa antibiotics  Home Medications   Prior to Admission medications   Medication Sig Start Date End Date Taking? Authorizing Provider  cetirizine (ZYRTEC) 10 MG tablet Take 10 mg by mouth daily as needed for allergies.   Yes Historical Provider, MD  cholecalciferol (VITAMIN D) 1000 UNITS tablet Take 2,000 Units by mouth daily.    Yes Historical Provider, MD  Chromium Picolinate 800 MCG TABS Take 1 tablet by mouth daily.   Yes Historical Provider, MD  dexlansoprazole (DEXILANT) 60 MG capsule Take 60 mg by mouth daily.   Yes Historical Provider, MD  ergocalciferol (VITAMIN D2) 50000 UNITS capsule Take 50,000 Units by mouth every 30 (thirty) days. Take on the 1st of every month.   Yes Historical Provider, MD  latanoprost (XALATAN) 0.005 % ophthalmic solution Place 1 drop into both eyes at bedtime.  03/11/14  Yes Historical Provider, MD  magnesium gluconate (MAGONATE) 500 MG tablet Take 500 mg by mouth daily.   Yes Historical Provider, MD  metoprolol (LOPRESSOR) 50 MG tablet TAKE 1 TABLET BY MOUTH TWICE A DAY 08/14/14  Yes Minus Breeding, MD  Multiple Vitamin (MULTIVITAMIN WITH  MINERALS) TABS Take 1 tablet by mouth daily.   Yes Historical Provider, MD  Omega-3 Fatty Acids (FISH OIL) 1200 MG CAPS Take 1,200 mg by mouth daily.   Yes Historical Provider, MD  promethazine (PHENERGAN) 25 MG tablet Take 25 mg by mouth every 6 (six) hours as needed for nausea or vomiting.   Yes Historical Provider, MD   BP 131/56 mmHg  Pulse 95  Temp(Src) 98.4 F (36.9 C) (Oral)  Resp 19  SpO2 95% Physical Exam  Constitutional: She is oriented to person, place, and time. She appears well-developed and well-nourished.  HENT:  Head: Normocephalic and atraumatic.  Cardiovascular: Normal rate and regular rhythm.   No murmur  heard. Pulmonary/Chest: Effort normal and breath sounds normal. No respiratory distress.  Abdominal: Soft. There is no rebound and no guarding.  Moderate diffuse abdominal tenderness without any guarding or rebound  Musculoskeletal: She exhibits no edema or tenderness.  Neurological: She is alert and oriented to person, place, and time.  Skin: Skin is warm and dry.  Psychiatric: She has a normal mood and affect. Her behavior is normal.  Nursing note and vitals reviewed.   ED Course  Procedures (including critical care time) Labs Review Labs Reviewed  CBC WITH DIFFERENTIAL - Abnormal; Notable for the following:    WBC 32.8 (*)    Platelets 558 (*)    Neutrophils Relative % 92 (*)    Lymphocytes Relative 3 (*)    Neutro Abs 30.2 (*)    Monocytes Absolute 1.6 (*)    All other components within normal limits  COMPREHENSIVE METABOLIC PANEL - Abnormal; Notable for the following:    Glucose, Bld 176 (*)    GFR calc non Af Amer 87 (*)    All other components within normal limits  LIPASE, BLOOD - Abnormal; Notable for the following:    Lipase 1170 (*)    All other components within normal limits  URINE CULTURE  URINALYSIS, ROUTINE W REFLEX MICROSCOPIC  I-STAT TROPOININ, ED  I-STAT CG4 LACTIC ACID, ED    Imaging Review Ct Abdomen Pelvis W Contrast  12/24/2014   CLINICAL DATA:  Acute onset of abdominal cramping, nausea and vomiting. Initial encounter.  EXAM: CT ABDOMEN AND PELVIS WITH CONTRAST  TECHNIQUE: Multidetector CT imaging of the abdomen and pelvis was performed using the standard protocol following bolus administration of intravenous contrast.  CONTRAST:  141mL OMNIPAQUE IOHEXOL 300 MG/ML  SOLN  COMPARISON:  CT of the abdomen and pelvis from 01/14/2011  FINDINGS: A small left pleural effusion is noted, with associated atelectasis. The patient is status post left-sided mastectomy. Calcification is noted at the mitral valve.  There is diffuse soft tissue inflammation and fluid about  the entirety of the pancreas, with apparent mild devascularization involving the body of the pancreas. No definite pseudocyst formation is seen. A moderate amount of surrounding free fluid tracks anterior to Gerota's fascia bilaterally, extends about the liver, and extends inferiorly along the paracolic gutters into the pelvis.  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 The patient is status post splenectomy. The gallbladder is within normal limits, though difficult to fully assess given surrounding fluid.  Prominent postoperative change is seen about the infrarenal abdominal aorta and right upper quadrant.  Tiny bilateral renal cysts are suggested. Minimal nonspecific left-sided perinephric stranding is seen. There is no evidence of hydronephrosis. No renal or ureteral stones are seen.  No free fluid is identified. The small bowel is unremarkable in  appearance. The stomach is within normal limits. No acute vascular abnormalities are seen. Mild calcification is seen at the origins of the celiac trunk and bilateral renal arteries.  The appendix is not definitely seen; there is no evidence of appendicitis. The colon is grossly unremarkable in appearance.  The bladder is mildly distended and grossly unremarkable. The uterus is grossly unremarkable. Postoperative change is seen at both adnexa. No suspicious adnexal masses are seen. No inguinal lymphadenopathy is seen.  No acute osseous abnormalities are identified.  IMPRESSION: 1. Significant acute pancreatitis, with apparent mild devascularization at the body of the pancreas. No definite pseudocyst formation seen. Moderate amount of surrounding free fluid tracks anterior to Gerota's fascia bilaterally, extends about the liver, and extends inferiorly along the paracolic gutters into the pelvis. 2. Small left pleural effusion, with associated atelectasis. 3. 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 4. Tiny bilateral renal cysts suggested. 5. Mild calcification at the origins of the celiac trunk and bilateral renal arteries. 6. Calcification incidentally noted at the mitral valve.  These results were called by telephone at the time of interpretation on 12/24/2014 at 11:55 pm to Dr. Quintella Reichert, who verbally acknowledged these results.   Electronically Signed   By: Garald Balding M.D.   On: 12/24/2014 23:56     EKG Interpretation   Date/Time:  Wednesday December 24 2014 20:37:18 EST Ventricular Rate:  86 PR Interval:  143 QRS Duration: 72 QT Interval:  371 QTC Calculation: 444 R Axis:   81 Text Interpretation:  Sinus rhythm Atrial premature complex Consider left  ventricular hypertrophy Nonspecific T abnormalities, anterior leads  Confirmed by Hazle Coca [phone]) on 12/24/2014 9:29:38 PM      MDM   Final diagnoses:  Acute pancreatitis, unspecified pancreatitis type    Patient here for evaluation of abdominal pain and vomiting. Patient markedly tender on exam. CBC with significant leukocytosis. CT scan and laboratory findings consistent with acute pancreatitis. Discussed with patient findings of labs and scanning in for admission for IV fluids and pain control and further management. Medicine consultation for admission.    Quintella Reichert, MD 12/24/14 [place], MD 12/25/14 [phone]

## 2014-12-24 NOTE — ED Notes (Signed)
Patient transported to CT 

## 2014-12-24 NOTE — ED Notes (Signed)
Per EMS pt from home, went to [employer] around [employer] pm, started having abdominal cramping and n/v, primary MD called in meclazine, denies diarrhea, 4mg  zofran given by EMS for nausea, 181mcg of fentanyl, went from 10/10 in upper abdominal quads to 4/10.

## 2014-12-24 NOTE — ED Notes (Signed)
Bed: EM75 Expected date: 12/24/14 Expected time: 8:05 PM Means of arrival: Ambulance Comments: Upper quad pain

## 2014-12-24 NOTE — ED Notes (Signed)
Pt stated she cannot give urine sample at this time

## 2014-12-25 ENCOUNTER — Encounter (HOSPITAL_COMMUNITY): Payer: Self-pay | Admitting: Internal Medicine

## 2014-12-25 ENCOUNTER — Inpatient Hospital Stay (HOSPITAL_COMMUNITY): Payer: BC Managed Care – PPO

## 2014-12-25 DIAGNOSIS — R197 Diarrhea, unspecified: Secondary | ICD-10-CM | POA: Diagnosis not present

## 2014-12-25 DIAGNOSIS — I4891 Unspecified atrial fibrillation: Secondary | ICD-10-CM | POA: Diagnosis present

## 2014-12-25 DIAGNOSIS — J9601 Acute respiratory failure with hypoxia: Secondary | ICD-10-CM | POA: Diagnosis present

## 2014-12-25 DIAGNOSIS — Z880 Allergy status to penicillin: Secondary | ICD-10-CM | POA: Diagnosis not present

## 2014-12-25 DIAGNOSIS — I4892 Unspecified atrial flutter: Secondary | ICD-10-CM | POA: Diagnosis present

## 2014-12-25 DIAGNOSIS — D72829 Elevated white blood cell count, unspecified: Secondary | ICD-10-CM | POA: Diagnosis present

## 2014-12-25 DIAGNOSIS — K859 Acute pancreatitis without necrosis or infection, unspecified: Secondary | ICD-10-CM | POA: Diagnosis present

## 2014-12-25 DIAGNOSIS — T50905A Adverse effect of unspecified drugs, medicaments and biological substances, initial encounter: Secondary | ICD-10-CM | POA: Diagnosis present

## 2014-12-25 DIAGNOSIS — K850 Idiopathic acute pancreatitis: Secondary | ICD-10-CM

## 2014-12-25 DIAGNOSIS — Z9081 Acquired absence of spleen: Secondary | ICD-10-CM | POA: Diagnosis present

## 2014-12-25 DIAGNOSIS — R739 Hyperglycemia, unspecified: Secondary | ICD-10-CM | POA: Diagnosis present

## 2014-12-25 DIAGNOSIS — K5909 Other constipation: Secondary | ICD-10-CM | POA: Diagnosis present

## 2014-12-25 DIAGNOSIS — Z853 Personal history of malignant neoplasm of breast: Secondary | ICD-10-CM | POA: Diagnosis not present

## 2014-12-25 DIAGNOSIS — Z9010 Acquired absence of unspecified breast and nipple: Secondary | ICD-10-CM | POA: Diagnosis present

## 2014-12-25 DIAGNOSIS — R109 Unspecified abdominal pain: Secondary | ICD-10-CM | POA: Diagnosis present

## 2014-12-25 DIAGNOSIS — Z923 Personal history of irradiation: Secondary | ICD-10-CM | POA: Diagnosis not present

## 2014-12-25 DIAGNOSIS — K858 Other acute pancreatitis: Secondary | ICD-10-CM

## 2014-12-25 DIAGNOSIS — H409 Unspecified glaucoma: Secondary | ICD-10-CM | POA: Diagnosis present

## 2014-12-25 DIAGNOSIS — K863 Pseudocyst of pancreas: Secondary | ICD-10-CM | POA: Diagnosis present

## 2014-12-25 DIAGNOSIS — I10 Essential (primary) hypertension: Secondary | ICD-10-CM

## 2014-12-25 DIAGNOSIS — E871 Hypo-osmolality and hyponatremia: Secondary | ICD-10-CM | POA: Diagnosis present

## 2014-12-25 DIAGNOSIS — E8770 Fluid overload, unspecified: Secondary | ICD-10-CM | POA: Diagnosis present

## 2014-12-25 DIAGNOSIS — J90 Pleural effusion, not elsewhere classified: Secondary | ICD-10-CM | POA: Diagnosis present

## 2014-12-25 DIAGNOSIS — N179 Acute kidney failure, unspecified: Secondary | ICD-10-CM | POA: Diagnosis present

## 2014-12-25 DIAGNOSIS — D649 Anemia, unspecified: Secondary | ICD-10-CM | POA: Diagnosis present

## 2014-12-25 DIAGNOSIS — K868 Other specified diseases of pancreas: Secondary | ICD-10-CM | POA: Diagnosis present

## 2014-12-25 DIAGNOSIS — R6510 Systemic inflammatory response syndrome (SIRS) of non-infectious origin without acute organ dysfunction: Secondary | ICD-10-CM | POA: Diagnosis present

## 2014-12-25 DIAGNOSIS — E876 Hypokalemia: Secondary | ICD-10-CM | POA: Diagnosis present

## 2014-12-25 DIAGNOSIS — Z8571 Personal history of Hodgkin lymphoma: Secondary | ICD-10-CM | POA: Diagnosis not present

## 2014-12-25 DIAGNOSIS — K219 Gastro-esophageal reflux disease without esophagitis: Secondary | ICD-10-CM | POA: Diagnosis present

## 2014-12-25 DIAGNOSIS — Z8711 Personal history of peptic ulcer disease: Secondary | ICD-10-CM | POA: Diagnosis not present

## 2014-12-25 LAB — HEMOGLOBIN A1C
Hgb A1c MFr Bld: 6.1 % — ABNORMAL HIGH (ref <5.7)
Mean Plasma Glucose: 128 mg/dL — ABNORMAL HIGH (ref <117)

## 2014-12-25 LAB — COMPREHENSIVE METABOLIC PANEL
ALT: 20 U/L (ref 0–35)
AST: 21 U/L (ref 0–37)
Albumin: 3.6 g/dL (ref 3.5–5.2)
Alkaline Phosphatase: 65 U/L (ref 39–117)
Anion gap: 10 (ref 5–15)
BUN: 16 mg/dL (ref 6–23)
CO2: 25 mmol/L (ref 19–32)
Calcium: 7.7 mg/dL — ABNORMAL LOW (ref 8.4–10.5)
Chloride: 99 mEq/L (ref 96–112)
Creatinine, Ser: 0.67 mg/dL (ref 0.50–1.10)
GFR calc Af Amer: >90 mL/min (ref >90)
GFR calc non Af Amer: >90 mL/min (ref >90)
Glucose, Bld: 213 mg/dL — ABNORMAL HIGH (ref 70–99)
Potassium: 4.3 mmol/L (ref 3.5–5.1)
Sodium: 134 mmol/L — ABNORMAL LOW (ref 135–145)
Total Bilirubin: 0.5 mg/dL (ref 0.3–1.2)
Total Protein: 6 g/dL (ref 6.0–8.3)

## 2014-12-25 LAB — URINALYSIS, ROUTINE W REFLEX MICROSCOPIC
Bilirubin Urine: NEGATIVE
Glucose, UA: NEGATIVE mg/dL
Hgb urine dipstick: NEGATIVE
Ketones, ur: 15 mg/dL — AB
Leukocytes, UA: NEGATIVE
Nitrite: NEGATIVE
Protein, ur: NEGATIVE mg/dL
Specific Gravity, Urine: >1.046 — ABNORMAL HIGH (ref 1.005–1.030)
Urobilinogen, UA: 0.2 mg/dL (ref 0.0–1.0)
pH: 5.5 (ref 5.0–8.0)

## 2014-12-25 LAB — CBC
HCT: 40.4 % (ref 36.0–46.0)
Hemoglobin: 13.2 g/dL (ref 12.0–15.0)
MCH: 29.4 pg (ref 26.0–34.0)
MCHC: 32.7 g/dL (ref 30.0–36.0)
MCV: 90 fL (ref 78.0–100.0)
Platelets: 513 10*3/uL — ABNORMAL HIGH (ref 150–400)
RBC: 4.49 MIL/uL (ref 3.87–5.11)
RDW: 14.8 % (ref 11.5–15.5)
WBC: 27.4 10*3/uL — ABNORMAL HIGH (ref 4.0–10.5)

## 2014-12-25 LAB — LIPASE, BLOOD: Lipase: 1218 U/L — ABNORMAL HIGH (ref 11–59)

## 2014-12-25 LAB — LIPID PANEL
Cholesterol: 152 mg/dL (ref 0–200)
HDL: 51 mg/dL (ref >39)
LDL Cholesterol: 93 mg/dL (ref 0–99)
Total CHOL/HDL Ratio: 3 RATIO
Triglycerides: 40 mg/dL (ref <150)
VLDL: 8 mg/dL (ref 0–40)

## 2014-12-25 LAB — PHOSPHORUS: Phosphorus: 4.4 mg/dL (ref 2.3–4.6)

## 2014-12-25 LAB — MAGNESIUM: Magnesium: 1.8 mg/dL (ref 1.5–2.5)

## 2014-12-25 LAB — TSH: TSH: 2.037 u[IU]/mL (ref 0.350–4.500)

## 2014-12-25 MED ORDER — CHLORHEXIDINE GLUCONATE 0.12 % MT SOLN
15.0000 mL | Freq: Two times a day (BID) | OROMUCOSAL | Status: DC
  Administered 2014-12-25 – 2015-01-07 (×24): 15 mL via OROMUCOSAL
  Filled 2014-12-25 (×30): qty 15

## 2014-12-25 MED ORDER — ACETAMINOPHEN 650 MG RE SUPP: 650.0000 mg | Freq: Four times a day (QID) | RECTAL | Status: DC | PRN

## 2014-12-25 MED ORDER — METOPROLOL TARTRATE 1 MG/ML IV SOLN
2.5000 mg | Freq: Two times a day (BID) | INTRAVENOUS | Status: DC
  Administered 2014-12-25 – 2014-12-26 (×4): 2.5 mg via INTRAVENOUS
  Filled 2014-12-25 (×4): qty 5

## 2014-12-25 MED ORDER — ONDANSETRON HCL 4 MG PO TABS
4.0000 mg | ORAL_TABLET | Freq: Four times a day (QID) | ORAL | Status: DC | PRN
  Administered 2015-01-07: 4 mg via ORAL
  Filled 2014-12-25: qty 1

## 2014-12-25 MED ORDER — SODIUM CHLORIDE 0.9 % IJ SOLN
3.0000 mL | Freq: Two times a day (BID) | INTRAMUSCULAR | Status: DC
  Administered 2014-12-26 – 2015-01-04 (×12): 3 mL via INTRAVENOUS

## 2014-12-25 MED ORDER — METRONIDAZOLE IN NACL 5-0.79 MG/ML-% IV SOLN
500.0000 mg | Freq: Three times a day (TID) | INTRAVENOUS | Status: DC
  Administered 2014-12-25 (×2): 500 mg via INTRAVENOUS
  Filled 2014-12-25 (×3): qty 100

## 2014-12-25 MED ORDER — CIPROFLOXACIN IN D5W 400 MG/200ML IV SOLN
400.0000 mg | Freq: Two times a day (BID) | INTRAVENOUS | Status: DC
  Administered 2014-12-25 (×2): 400 mg via INTRAVENOUS
  Filled 2014-12-25 (×2): qty 200

## 2014-12-25 MED ORDER — CETYLPYRIDINIUM CHLORIDE 0.05 % MT LIQD
7.0000 mL | Freq: Two times a day (BID) | OROMUCOSAL | Status: DC
  Administered 2014-12-25 – 2015-01-07 (×22): 7 mL via OROMUCOSAL

## 2014-12-25 MED ORDER — ONDANSETRON HCL 4 MG/2ML IJ SOLN
4.0000 mg | Freq: Four times a day (QID) | INTRAMUSCULAR | Status: DC | PRN
  Administered 2014-12-25 – 2015-01-02 (×5): 4 mg via INTRAVENOUS
  Filled 2014-12-25 (×5): qty 2

## 2014-12-25 MED ORDER — SODIUM CHLORIDE 0.9 % IV SOLN
INTRAVENOUS | Status: AC
Start: 2014-12-25 — End: 2014-12-25
  Administered 2014-12-25: 02:00:00 via INTRAVENOUS

## 2014-12-25 MED ORDER — HYDROMORPHONE HCL 1 MG/ML IJ SOLN
1.0000 mg | INTRAMUSCULAR | Status: DC | PRN
  Administered 2014-12-25 – 2014-12-26 (×6): 1 mg via INTRAVENOUS
  Filled 2014-12-25 (×6): qty 1

## 2014-12-25 MED ORDER — SODIUM CHLORIDE 0.9 % IV SOLN
INTRAVENOUS | Status: DC
  Administered 2014-12-25: 17:00:00 via INTRAVENOUS

## 2014-12-25 MED ORDER — LATANOPROST 0.005 % OP SOLN
1.0000 [drp] | Freq: Every day | OPHTHALMIC | Status: DC
  Administered 2014-12-25 – 2015-01-06 (×14): 1 [drp] via OPHTHALMIC
  Filled 2014-12-25: qty 2.5

## 2014-12-25 MED ORDER — ACETAMINOPHEN 325 MG PO TABS
650.0000 mg | ORAL_TABLET | Freq: Four times a day (QID) | ORAL | Status: DC | PRN
Start: 2014-12-25 — End: 2015-01-07
  Administered 2014-12-27 – 2015-01-01 (×7): 650 mg via ORAL
  Filled 2014-12-25 (×7): qty 2

## 2014-12-25 MED ORDER — HYDROMORPHONE HCL 1 MG/ML IJ SOLN
1.0000 mg | Freq: Once | INTRAMUSCULAR | Status: AC
  Administered 2014-12-25: 1 mg via INTRAVENOUS
  Filled 2014-12-25: qty 1

## 2014-12-25 NOTE — Consult Note (Signed)
Reason for Consult: Acute pancreatitis Referring Physician: [facility]  [name] HPI: This is a 64 year old female with a PMH of Hodgkin's lymphoma, breast cancer, and atrial fibrillation admitted to the hospital with acute pancreatitis.  Yesterday she was eating lunch at 12 PM and over the next hour or two she started to have issues with abdominal pain and nausea.  By 3 PM her symptoms were severe and she presented to the ER.  The work up with blood work and imaging confirmed the findings of an acute pancreatitis, but there was no evidence of any stones or masses.  The blood work was negative for any biliary ductal dilation.  She also denies any new medications and she denies any sick contacts.  Currently she is feeling better with the pain medications and her current IV rate of fluids is at 200 ml/hour.  Past Medical History  Diagnosis Date  . Hodgkin's lymphoma     Treated with radiation and chemo  . Ulcer   . Breast cancer   . Atrial flutter     Ablated 1998 Dr. Caryl Comes    Past Surgical History  Procedure Laterality Date  . Laparotomy    . Splenectomy  1974    hodgkins disease  . Tonsillectomy and adenoidectomy    . Tee without cardioversion  06/12/2012    Procedure: TRANSESOPHAGEAL ECHOCARDIOGRAM (TEE);  Surgeon: Larey [name], MD;  Location: [facility] ENDOSCOPY;  Service: Cardiovascular;  Laterality: N/A;  . Cataract extraction, bilateral    . Mastectomy  2005    Left-axillary  . Colonoscopy    . Cardiac electrophysiology mapping and ablation  1999  . Orif ankle fracture Left 03/21/2014    Procedure: LEFT ANKLE FRACTURE OPEN TREATMENT BILMALLEOLAR INCLUDES INTERNAL FIXATION ;  Surgeon: Renette Butters, MD;  Location: [place];  Service: Orthopedics;  Laterality: Left;    Family History  Problem Relation Age of Onset  . Rheumatic fever Father     Died suddenly age 28  . Diabetes Father     Social History:  reports that she has never smoked. She does  not have any smokeless tobacco history on file. She reports that she does not drink alcohol or use illicit drugs.  Allergies:  Allergies  Allergen Reactions  . Penicillins Anaphylaxis, Hives and Swelling  . Sulfa Antibiotics Hives and Rash    Medications:  Scheduled: . antiseptic oral rinse  7 mL Mouth Rinse q12n4p  . chlorhexidine  15 mL Mouth Rinse BID  . ciprofloxacin  400 mg Intravenous Q12H  . latanoprost  1 drop Both Eyes QHS  . metoprolol  2.5 mg Intravenous Q12H  . metronidazole  500 mg Intravenous 3 times per day  . sodium chloride  3 mL Intravenous Q12H   Continuous:   Results for orders placed or performed during the hospital encounter of 12/24/14 (from the past 24 hour(s))  CBC with Differential     Status: Abnormal   Collection Time: 12/24/14  9:22 PM  Result Value Ref Range   WBC 32.8 (H) 4.0 - 10.5 K/uL   RBC 4.77 3.87 - 5.11 MIL/uL   Hemoglobin 14.3 12.0 - 15.0 g/dL   HCT 42.6 36.0 - 46.0 %   MCV 89.3 78.0 - 100.0 fL   MCH 30.0 26.0 - 34.0 pg   MCHC 33.6 30.0 - 36.0 g/dL   RDW 14.6 11.5 - 15.5 %   Platelets 558 (H) 150 - 400 K/uL   Neutrophils Relative %  92 (H) 43 - 77 %   Lymphocytes Relative 3 (L) 12 - 46 %   Monocytes Relative 5 3 - 12 %   Eosinophils Relative 0 0 - 5 %   Basophils Relative 0 0 - 1 %   Neutro Abs 30.2 (H) 1.7 - 7.7 K/uL   Lymphs Abs 1.0 0.7 - 4.0 K/uL   Monocytes Absolute 1.6 (H) 0.1 - 1.0 K/uL   Eosinophils Absolute 0.0 0.0 - 0.7 K/uL   Basophils Absolute 0.0 0.0 - 0.1 K/uL   WBC Morphology TOXIC GRANULATION   Comprehensive metabolic panel     Status: Abnormal   Collection Time: 12/24/14  9:22 PM  Result Value Ref Range   Sodium 137 135 - 145 mmol/L   Potassium 4.5 3.5 - 5.1 mmol/L   Chloride 99 96 - 112 mEq/L   CO2 29 19 - 32 mmol/L   Glucose, Bld 176 (H) 70 - 99 mg/dL   BUN 18 6 - 23 mg/dL   Creatinine, Ser 0.76 0.50 - 1.10 mg/dL   Calcium 9.1 8.4 - 10.5 mg/dL   Total Protein 6.9 6.0 - 8.3 g/dL   Albumin 4.3 3.5 - 5.2  g/dL   AST 28 0 - 37 U/L   ALT 26 0 - 35 U/L   Alkaline Phosphatase 79 39 - 117 U/L   Total Bilirubin 0.8 0.3 - 1.2 mg/dL   GFR calc non Af Amer 87 (L) >90 mL/min   GFR calc Af Amer >90 >90 mL/min   Anion gap 9 5 - 15  Lipase, blood     Status: Abnormal   Collection Time: 12/24/14  9:22 PM  Result Value Ref Range   Lipase 1170 (H) 11 - 59 U/L  I-stat troponin, ED (only if pt is 65 y.o. or older & pain is above umbilicus) - do not order at [facility]     Status: None   Collection Time: 12/24/14  9:34 PM  Result Value Ref Range   Troponin i, poc 0.00 0.00 - 0.08 ng/mL   Comment 3          I-Stat CG4 Lactic Acid, ED     Status: None   Collection Time: 12/24/14 11:31 PM  Result Value Ref Range   Lactic Acid, Venous 1.53 0.5 - 2.2 mmol/L  Urinalysis, Routine w reflex microscopic     Status: Abnormal   Collection Time: 12/25/14 12:23 AM  Result Value Ref Range   Color, Urine YELLOW YELLOW   APPearance CLEAR CLEAR   Specific Gravity, Urine >1.046 (H) 1.005 - 1.030   pH 5.5 5.0 - 8.0   Glucose, UA NEGATIVE NEGATIVE mg/dL   Hgb urine dipstick NEGATIVE NEGATIVE   Bilirubin Urine NEGATIVE NEGATIVE   Ketones, ur 15 (A) NEGATIVE mg/dL   Protein, ur NEGATIVE NEGATIVE mg/dL   Urobilinogen, UA 0.2 0.0 - 1.0 mg/dL   Nitrite NEGATIVE NEGATIVE   Leukocytes, UA NEGATIVE NEGATIVE  Magnesium     Status: None   Collection Time: 12/25/14  4:26 AM  Result Value Ref Range   Magnesium 1.8 1.5 - 2.5 mg/dL  Phosphorus     Status: None   Collection Time: 12/25/14  4:26 AM  Result Value Ref Range   Phosphorus 4.4 2.3 - 4.6 mg/dL  TSH     Status: None   Collection Time: 12/25/14  4:26 AM  Result Value Ref Range   TSH 2.037 0.350 - 4.500 uIU/mL  Comprehensive metabolic panel     Status: Abnormal  Collection Time: 12/25/14  4:26 AM  Result Value Ref Range   Sodium 134 (L) 135 - 145 mmol/L   Potassium 4.3 3.5 - 5.1 mmol/L   Chloride 99 96 - 112 mEq/L   CO2 25 19 - 32 mmol/L   Glucose, Bld 213 (H) 70 -  99 mg/dL   BUN 16 6 - 23 mg/dL   Creatinine, Ser 0.67 0.50 - 1.10 mg/dL   Calcium 7.7 (L) 8.4 - 10.5 mg/dL   Total Protein 6.0 6.0 - 8.3 g/dL   Albumin 3.6 3.5 - 5.2 g/dL   AST 21 0 - 37 U/L   ALT 20 0 - 35 U/L   Alkaline Phosphatase 65 39 - 117 U/L   Total Bilirubin 0.5 0.3 - 1.2 mg/dL   GFR calc non Af Amer >90 >90 mL/min   GFR calc Af Amer >90 >90 mL/min   Anion gap 10 5 - 15  CBC     Status: Abnormal   Collection Time: 12/25/14  4:26 AM  Result Value Ref Range   WBC 27.4 (H) 4.0 - 10.5 K/uL   RBC 4.49 3.87 - 5.11 MIL/uL   Hemoglobin 13.2 12.0 - 15.0 g/dL   HCT 40.4 36.0 - 46.0 %   MCV 90.0 78.0 - 100.0 fL   MCH 29.4 26.0 - 34.0 pg   MCHC 32.7 30.0 - 36.0 g/dL   RDW 14.8 11.5 - 15.5 %   Platelets 513 (H) 150 - 400 K/uL  Lipase, blood     Status: Abnormal   Collection Time: 12/25/14  4:26 AM  Result Value Ref Range   Lipase 1218 (H) 11 - 59 U/L  Lipid panel     Status: None   Collection Time: 12/25/14  4:26 AM  Result Value Ref Range   Cholesterol 152 0 - 200 mg/dL   Triglycerides 40 <150 mg/dL   HDL 51 >39 mg/dL   Total CHOL/HDL Ratio 3.0 RATIO   VLDL 8 0 - 40 mg/dL   LDL Cholesterol 93 0 - 99 mg/dL     US Abdomen Complete  12/25/2014   CLINICAL DATA:  Subsequent evaluation of pancreatitis patient history of splenectomy breast cancer lymphoma  EXAM: ULTRASOUND ABDOMEN COMPLETE  COMPARISON:  Multiple prior studies  FINDINGS: Gallbladder: No gallstones or wall thickening visualized. No sonographic Murphy sign noted.  Common bile duct: Diameter: 6 mm  Liver: No focal lesion identified. Within normal limits in parenchymal echogenicity.  IVC: No abnormality visualized.  Pancreas: Hypoechoic and thickened to an antero posterior diameter of 3.5 cm. No pseudocyst identified.  Spleen: Surgically absent  Right Kidney: Length: 9.6 cm. Echogenicity within normal limits. No mass or hydronephrosis visualized.  Left Kidney: Length: 9.4 cm. Echogenicity within normal limits. No mass or  hydronephrosis visualized.  Abdominal aorta: No aneurysm visualized. Distal abdominal aorta is obscured by bowel gas.  Other findings: Small right pleural effusion. Small volume of ascites.  IMPRESSION: Findings consistent with pancreatitis. Findings are felt to be similar to those on a CT scan performed 12/24/2014.   Electronically Signed   By: Skipper Cliche M.D.   On: 12/25/2014 09:30   Ct Abdomen Pelvis W Contrast  12/24/2014   CLINICAL DATA:  Acute onset of abdominal cramping, nausea and vomiting. Initial encounter.  EXAM: CT ABDOMEN AND PELVIS WITH CONTRAST  TECHNIQUE: Multidetector CT imaging of the abdomen and pelvis was performed using the standard protocol following bolus administration of intravenous contrast.  CONTRAST:  176mL OMNIPAQUE IOHEXOL 300 MG/ML  SOLN  COMPARISON:  CT of the abdomen and pelvis from 01/14/2011  FINDINGS: A small left pleural effusion is noted, with associated atelectasis. The patient is status post left-sided mastectomy. Calcification is noted at the mitral valve.  There is diffuse soft tissue inflammation and fluid about the entirety of the pancreas, with apparent mild devascularization involving the body of the pancreas. No definite pseudocyst formation is seen. A moderate amount of surrounding free fluid tracks anterior to Gerota's fascia bilaterally, extends about the liver, and extends inferiorly along the paracolic gutters into the pelvis.  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 The patient is status post splenectomy. The gallbladder is within normal limits, though difficult to fully assess given surrounding fluid.  Prominent postoperative change is seen about the infrarenal abdominal aorta and right upper quadrant.  Tiny bilateral renal cysts are suggested. Minimal nonspecific left-sided perinephric stranding is seen. There is no evidence of hydronephrosis. No renal or ureteral stones are seen.  No free fluid is  identified. The small bowel is unremarkable in appearance. The stomach is within normal limits. No acute vascular abnormalities are seen. Mild calcification is seen at the origins of the celiac trunk and bilateral renal arteries.  The appendix is not definitely seen; there is no evidence of appendicitis. The colon is grossly unremarkable in appearance.  The bladder is mildly distended and grossly unremarkable. The uterus is grossly unremarkable. Postoperative change is seen at both adnexa. No suspicious adnexal masses are seen. No inguinal lymphadenopathy is seen.  No acute osseous abnormalities are identified.  IMPRESSION: 1. Significant acute pancreatitis, with apparent mild devascularization at the body of the pancreas. No definite pseudocyst formation seen. Moderate amount of surrounding free fluid tracks anterior to Gerota's fascia bilaterally, extends about the liver, and extends inferiorly along the paracolic gutters into the pelvis. 2. Small left pleural effusion, with associated atelectasis. 3. 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 4. Tiny bilateral renal cysts suggested. 5. Mild calcification at the origins of the celiac trunk and bilateral renal arteries. 6. Calcification incidentally noted at the mitral valve.  These results were called by telephone at the time of interpretation on 12/24/2014 at 11:55 pm to Dr. Quintella Reichert, who verbally acknowledged these results.   Electronically Signed   By: Garald Balding M.D.   On: 12/24/2014 23:56    ROS:  As stated above in the HPI otherwise negative.  Blood pressure 162/61, pulse 113, temperature 99.8 F (37.7 C), temperature source Oral, resp. rate 18, height 5\' 6"  (1.676 m), weight 63.866 kg (140 lb 12.8 oz), SpO2 91 %.    PE: Gen: NAD, Alert and Oriented HEENT:  Greasy/AT, EOMI Neck: Supple, no LAD Lungs: CTA Bilaterally CV: RRR without M/G/R ABM: Soft, epigastric tenderness and right sided tenderness,  +BS Ext: No C/C/E  Assessment/Plan: 1) Acute pancreatitis 2) History of lymphoma. 3) History of breast cancer.   Her history is suggestive of a biliary source, but the work up with imaging and blood work does not support this etiology.  However, in the near future, i.e., 2 months, I will arrange for her to undergo an EUS to clear out the pancreatic parenchyma and evaluate the biliary tract again.  There is no family history of any gallbladder disease and she denies any new medications.  She is feeling better with the pain medications.  Plan: 1) Continue NPO and pain control. 2) Increase IV fluids to 250 ml/hour. 3)  Follow BUN/Cr and electrolytes closely. 4) D/C cipro and metronidazole.  No need for treatment of the pancreatitis or to prophylax against any pancreatic sequelae with antibiotics.  Only if the patient has fevers after day 10 of an acute pancreatitis is an antibiotic recommended.  Marshall Kampf D 12/25/2014, 3:51 PM

## 2014-12-25 NOTE — Progress Notes (Signed)
Patient seen and examined. Admitted after midnight secondary to abd pain and nausea. Found to have severe acute pancreatitis. Please referred to H&P by Dr. Roel Cluck for further info/details on admission. Patient is currently no having any active vomiting still feeling nauseous and with epigastric discomfort. No fevers.  Plan: -Ultrasound has demonstrated no CBD dilatation, wall thickening or gallstones in her gallbladder -Will continue supportive care (IV fluids, antiemetics and as needed and OT 6) -Will repeat lipase and CMET in a.m. -no specific etiology for pancreatitis identified. GI has been consulted. Will follow any further rec's  Patricia [name] [phone]

## 2014-12-25 NOTE — H&P (Signed)
PCP: Tommy Medal, MD  Hoyt Koch  Chief Complaint:  Abdominal pain HPI: Patricia [name] is a 64 y.o. female   has a past medical history of Hodgkin's lymphoma; Ulcer; Breast cancer; and Atrial flutter.   Presented with  Epigastric pain since 3 pm. She started to have nausea an vomiting. Pain was severe and she came to ER.  CT abdomen showed pancreatitis with devascularization at the body of the pancreas. Patient denies any alcohol abuse. No change in her recent medication. She still has gallbladder. Patient had remote history of non-Hodgkin's lymphoma status post splenectomy. Patient still endorses some abdominal pain.  Hospitalist was called for admission for acute pancreatitis  Review of Systems:    Pertinent positives include:   abdominal pain, nausea, vomiting, chills,   Constitutional:  No weight loss, night sweats, Fevers, fatigue, weight loss  HEENT:  No headaches, Difficulty swallowing,Tooth/dental problems,Sore throat,  No sneezing, itching, ear ache, nasal congestion, post nasal drip,  Cardio-vascular:  No chest pain, Orthopnea, PND, anasarca, dizziness, palpitations.no Bilateral lower extremity swelling  GI:  No heartburn, indigestion, diarrhea, change in bowel habits, loss of appetite, melena, blood in stool, hematemesis Resp:  no shortness of breath at rest. No dyspnea on exertion, No excess mucus, no productive cough, No non-productive cough, No coughing up of blood.No change in color of mucus.No wheezing. Skin:  no rash or lesions. No jaundice GU:  no dysuria, change in color of urine, no urgency or frequency. No straining to urinate.  No flank pain.  Musculoskeletal:  No joint pain or no joint swelling. No decreased range of motion. No back pain.  Psych:  No change in mood or affect. No depression or anxiety. No memory loss.  Neuro: no localizing neurological complaints, no tingling, no weakness, no double vision, no gait abnormality, no slurred speech, no  confusion  Otherwise ROS are negative except for above, 10 systems were reviewed  Past Medical History: Past Medical History  Diagnosis Date  . Hodgkin's lymphoma     Treated with radiation and chemo  . Ulcer   . Breast cancer   . Atrial flutter     Ablated 1998 Dr. Caryl Comes   Past Surgical History  Procedure Laterality Date  . Laparotomy    . Splenectomy  1974    hodgkins disease  . Tonsillectomy and adenoidectomy    . Tee without cardioversion  06/12/2012    Procedure: TRANSESOPHAGEAL ECHOCARDIOGRAM (TEE);  Surgeon: Larey [name], MD;  Location: [facility] ENDOSCOPY;  Service: Cardiovascular;  Laterality: N/A;  . Cataract extraction, bilateral    . Mastectomy  2005    Left-axillary  . Colonoscopy    . Cardiac electrophysiology mapping and ablation  1999  . Orif ankle fracture Left 03/21/2014    Procedure: LEFT ANKLE FRACTURE OPEN TREATMENT BILMALLEOLAR INCLUDES INTERNAL FIXATION ;  Surgeon: Renette Butters, MD;  Location: [place];  Service: Orthopedics;  Laterality: Left;     Medications: Prior to Admission medications   Medication Sig Start Date End Date Taking? Authorizing Provider  cetirizine (ZYRTEC) 10 MG tablet Take 10 mg by mouth daily as needed for allergies.   Yes Historical Provider, MD  cholecalciferol (VITAMIN D) 1000 UNITS tablet Take 2,000 Units by mouth daily.    Yes Historical Provider, MD  Chromium Picolinate 800 MCG TABS Take 1 tablet by mouth daily.   Yes Historical Provider, MD  dexlansoprazole (DEXILANT) 60 MG capsule Take 60 mg by mouth daily.   Yes Historical Provider,  MD  ergocalciferol (VITAMIN D2) 50000 UNITS capsule Take 50,000 Units by mouth every 30 (thirty) days. Take on the 1st of every month.   Yes Historical Provider, MD  latanoprost (XALATAN) 0.005 % ophthalmic solution Place 1 drop into both eyes at bedtime.  03/11/14  Yes Historical Provider, MD  magnesium gluconate (MAGONATE) 500 MG tablet Take 500 mg by mouth daily.   Yes  Historical Provider, MD  metoprolol (LOPRESSOR) 50 MG tablet TAKE 1 TABLET BY MOUTH TWICE A DAY 08/14/14  Yes Minus Breeding, MD  Multiple Vitamin (MULTIVITAMIN WITH MINERALS) TABS Take 1 tablet by mouth daily.   Yes Historical Provider, MD  Omega-3 Fatty Acids (FISH OIL) 1200 MG CAPS Take 1,200 mg by mouth daily.   Yes Historical Provider, MD  promethazine (PHENERGAN) 25 MG tablet Take 25 mg by mouth every 6 (six) hours as needed for nausea or vomiting.   Yes Historical Provider, MD    Allergies:   Allergies  Allergen Reactions  . Penicillins Anaphylaxis, Hives and Swelling  . Sulfa Antibiotics Hives and Rash    Social History:  Ambulatory   independently   Lives at home   With family     reports that she has never smoked. She does not have any smokeless tobacco history on file. She reports that she does not drink alcohol or use illicit drugs.    Family History: family history includes Diabetes in her father; Rheumatic fever in her father.   Physical Exam: Patient Vitals for the past 24 hrs:  BP Temp Temp src Pulse Resp SpO2  12/24/14 2348 154/57 mmHg - - 96 19 100 %  12/24/14 2230 153/58 mmHg - - 95 16 100 %  12/24/14 2154 (!) 140/49 mmHg - - 92 18 94 %  12/24/14 2040 131/56 mmHg 98.4 F (36.9 C) Oral 95 19 95 %    1. General:  Appears uncomfortable 2. Psychological: Alert and Oriented 3. Head/ENT:     Dry Mucous Membranes                          Head Non traumatic, neck supple                          Normal  Dentition 4. SKIN:   decreased Skin turgor,  Skin clean Dry and intact no rash 5. Heart: Regular rate and rhythm no Murmur, Rub or gallop 6. Lungs: Clear to auscultation bilaterally, no wheezes or crackles   7. Abdomen: Soft, epigastric tenderness, Non distended, some rebound tenderness 8. Lower extremities: no clubbing, cyanosis, or edema 9. Neurologically Grossly intact, moving all 4 extremities equally 10. MSK: Normal range of motion  body mass index is  unknown because there is no weight on file.   Labs on Admission:   Results for orders placed or performed during the hospital encounter of 12/24/14 (from the past 24 hour(s))  CBC with Differential     Status: Abnormal   Collection Time: 12/24/14  9:22 PM  Result Value Ref Range   WBC 32.8 (H) 4.0 - 10.5 K/uL   RBC 4.77 3.87 - 5.11 MIL/uL   Hemoglobin 14.3 12.0 - 15.0 g/dL   HCT 42.6 36.0 - 46.0 %   MCV 89.3 78.0 - 100.0 fL   MCH 30.0 26.0 - 34.0 pg   MCHC 33.6 30.0 - 36.0 g/dL   RDW 14.6 11.5 - 15.5 %   Platelets 558 (H)  150 - 400 K/uL   Neutrophils Relative % 92 (H) 43 - 77 %   Lymphocytes Relative 3 (L) 12 - 46 %   Monocytes Relative 5 3 - 12 %   Eosinophils Relative 0 0 - 5 %   Basophils Relative 0 0 - 1 %   Neutro Abs 30.2 (H) 1.7 - 7.7 K/uL   Lymphs Abs 1.0 0.7 - 4.0 K/uL   Monocytes Absolute 1.6 (H) 0.1 - 1.0 K/uL   Eosinophils Absolute 0.0 0.0 - 0.7 K/uL   Basophils Absolute 0.0 0.0 - 0.1 K/uL   WBC Morphology TOXIC GRANULATION   Comprehensive metabolic panel     Status: Abnormal   Collection Time: 12/24/14  9:22 PM  Result Value Ref Range   Sodium 137 135 - 145 mmol/L   Potassium 4.5 3.5 - 5.1 mmol/L   Chloride 99 96 - 112 mEq/L   CO2 29 19 - 32 mmol/L   Glucose, Bld 176 (H) 70 - 99 mg/dL   BUN 18 6 - 23 mg/dL   Creatinine, Ser 0.76 0.50 - 1.10 mg/dL   Calcium 9.1 8.4 - 10.5 mg/dL   Total Protein 6.9 6.0 - 8.3 g/dL   Albumin 4.3 3.5 - 5.2 g/dL   AST 28 0 - 37 U/L   ALT 26 0 - 35 U/L   Alkaline Phosphatase 79 39 - 117 U/L   Total Bilirubin 0.8 0.3 - 1.2 mg/dL   GFR calc non Af Amer 87 (L) >90 mL/min   GFR calc Af Amer >90 >90 mL/min   Anion gap 9 5 - 15  Lipase, blood     Status: Abnormal   Collection Time: 12/24/14  9:22 PM  Result Value Ref Range   Lipase 1170 (H) 11 - 59 U/L  I-stat troponin, ED (only if pt is 65 y.o. or older & pain is above umbilicus) - do not order at [facility]     Status: None   Collection Time: 12/24/14  9:34 PM  Result Value Ref Range    Troponin i, poc 0.00 0.00 - 0.08 ng/mL   Comment 3          I-Stat CG4 Lactic Acid, ED     Status: None   Collection Time: 12/24/14 11:31 PM  Result Value Ref Range   Lactic Acid, Venous 1.53 0.5 - 2.2 mmol/L    UA have been ordered  No results found for: HGBA1C  CrCl cannot be calculated (Unknown ideal weight.).  BNP (last 3 results) No results for input(s): PROBNP in the last 8760 hours.  Other results:  I have pearsonaly reviewed this: ECG REPORT  Rate: 86  Rhythm: Dennis rhythm with PAC ST&T Change: No ischemic changes   There were no vitals filed for this visit.   Cultures: No results found for: SDES, SPECREQUEST, CULT, REPTSTATUS   Radiological Exams on Admission: Ct Abdomen Pelvis W Contrast  12/24/2014   CLINICAL DATA:  Acute onset of abdominal cramping, nausea and vomiting. Initial encounter.  EXAM: CT ABDOMEN AND PELVIS WITH CONTRAST  TECHNIQUE: Multidetector CT imaging of the abdomen and pelvis was performed using the standard protocol following bolus administration of intravenous contrast.  CONTRAST:  121mL OMNIPAQUE IOHEXOL 300 MG/ML  SOLN  COMPARISON:  CT of the abdomen and pelvis from 01/14/2011  FINDINGS: A small left pleural effusion is noted, with associated atelectasis. The patient is status post left-sided mastectomy. Calcification is noted at the mitral valve.  There is diffuse soft tissue inflammation and  fluid about the entirety of the pancreas, with apparent mild devascularization involving the body of the pancreas. No definite pseudocyst formation is seen. A moderate amount of surrounding free fluid tracks anterior to Gerota's fascia bilaterally, extends about the liver, and extends inferiorly along the paracolic gutters into the pelvis.  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 The patient is status post splenectomy. The gallbladder is within normal limits, though difficult to fully assess given  surrounding fluid.  Prominent postoperative change is seen about the infrarenal abdominal aorta and right upper quadrant.  Tiny bilateral renal cysts are suggested. Minimal nonspecific left-sided perinephric stranding is seen. There is no evidence of hydronephrosis. No renal or ureteral stones are seen.  No free fluid is identified. The small bowel is unremarkable in appearance. The stomach is within normal limits. No acute vascular abnormalities are seen. Mild calcification is seen at the origins of the celiac trunk and bilateral renal arteries.  The appendix is not definitely seen; there is no evidence of appendicitis. The colon is grossly unremarkable in appearance.  The bladder is mildly distended and grossly unremarkable. The uterus is grossly unremarkable. Postoperative change is seen at both adnexa. No suspicious adnexal masses are seen. No inguinal lymphadenopathy is seen.  No acute osseous abnormalities are identified.  IMPRESSION: 1. Significant acute pancreatitis, with apparent mild devascularization at the body of the pancreas. No definite pseudocyst formation seen. Moderate amount of surrounding free fluid tracks anterior to Gerota's fascia bilaterally, extends about the liver, and extends inferiorly along the paracolic gutters into the pelvis. 2. Small left pleural effusion, with associated atelectasis. 3. 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 4. Tiny bilateral renal cysts suggested. 5. Mild calcification at the origins of the celiac trunk and bilateral renal arteries. 6. Calcification incidentally noted at the mitral valve.  These results were called by telephone at the time of interpretation on 12/24/2014 at 11:55 pm to Dr. Quintella Reichert, who verbally acknowledged these results.   Electronically Signed   By: Garald Balding M.D.   On: 12/24/2014 23:56    Chart has been reviewed  Assessment/Plan  64 year old female with history of hypertension and  splenectomy due to remote history of non-Hodgkin's lymphoma here severe Acute pancreatitis  Present on Admission:  . Acute pancreatitis - we'll admit, make nothing by mouth, aggressive IV fluids, obtain lipid panel, ultrasound of abdomen to evaluate for gallstones as a cause of pancreatitis, pain management, given severity of pancreatitis and possibility of developing necrotizing pancreatitis we will empirically start on antibiotics Flagyl and Cipro as patient has allergies to penicillin. GI consult in the morning  . HTN (hypertension) - hold by mouth meds, continue metoprolol IV   Prophylaxis: SCD , Protonix  CODE STATUS:   DNR/DNI as per patient  Other plan as per orders.  I have spent a total of 55 min on this admission  Lillyahna Hemberger 12/25/2014, 12:35 AM  [facility]  Pager [phone]   after 2 AM please page floor coverage PA If 7AM-7PM, please contact the day team taking care of the patient  [email or web address]  Password [email or web address]

## 2014-12-26 ENCOUNTER — Inpatient Hospital Stay (HOSPITAL_COMMUNITY): Payer: BC Managed Care – PPO

## 2014-12-26 LAB — URINE CULTURE
Colony Count: NO GROWTH
Culture: NO GROWTH

## 2014-12-26 LAB — COMPREHENSIVE METABOLIC PANEL
ALT: 15 U/L (ref 0–35)
AST: 18 U/L (ref 0–37)
Albumin: 2.9 g/dL — ABNORMAL LOW (ref 3.5–5.2)
Alkaline Phosphatase: 58 U/L (ref 39–117)
Anion gap: 8 (ref 5–15)
BUN: 15 mg/dL (ref 6–23)
CO2: 24 mmol/L (ref 19–32)
Calcium: 6.7 mg/dL — ABNORMAL LOW (ref 8.4–10.5)
Chloride: 110 mEq/L (ref 96–112)
Creatinine, Ser: 0.69 mg/dL (ref 0.50–1.10)
GFR calc Af Amer: >90 mL/min (ref >90)
GFR calc non Af Amer: >90 mL/min (ref >90)
Glucose, Bld: 132 mg/dL — ABNORMAL HIGH (ref 70–99)
Potassium: 4.3 mmol/L (ref 3.5–5.1)
Sodium: 142 mmol/L (ref 135–145)
Total Bilirubin: 0.7 mg/dL (ref 0.3–1.2)
Total Protein: 5.4 g/dL — ABNORMAL LOW (ref 6.0–8.3)

## 2014-12-26 LAB — LIPASE, BLOOD: Lipase: 475 U/L — ABNORMAL HIGH (ref 11–59)

## 2014-12-26 MED ORDER — HYDROMORPHONE HCL 1 MG/ML IJ SOLN
1.0000 mg | INTRAMUSCULAR | Status: DC | PRN
  Administered 2014-12-26 – 2015-01-02 (×23): 1 mg via INTRAVENOUS
  Filled 2014-12-26 (×22): qty 1

## 2014-12-26 MED ORDER — PANTOPRAZOLE SODIUM 40 MG IV SOLR
40.0000 mg | INTRAVENOUS | Status: DC
  Administered 2014-12-26 – 2015-01-03 (×6): 40 mg via INTRAVENOUS
  Filled 2014-12-26 (×9): qty 40

## 2014-12-26 MED ORDER — DEXTROSE-NACL 5-0.45 % IV SOLN
INTRAVENOUS | Status: DC
  Administered 2014-12-26 – 2014-12-28 (×6): via INTRAVENOUS

## 2014-12-26 NOTE — Progress Notes (Signed)
Subjective: No significant change to her pain, but it is controlled with Dilaudid.  Pulse ox decreased into the low 80's last evening, but it has increased with oxygen.  Objective: Vital signs in last 24 hours: Temp:  [98.3 F (36.8 C)-100.3 F (37.9 C)] 99.1 F (37.3 C) (01/15 0652) Pulse Rate:  [113-121] 119 (01/15 0652) Resp:  [18] 18 (01/15 0652) BP: (125-162)/(44-65) 155/65 mmHg (01/15 0652) SpO2:  [87 %-97 %] 97 % (01/15 0652) Last BM Date: 12/24/14  Intake/Output from previous day: 01/14 0701 - 01/15 0700 In: 3087.5 [I.V.:2487.5; IV Piggyback:600] Out: 3 [Urine:3] Intake/Output this shift:    General appearance: alert and no distress Resp: mild decrease in breath sounds at the lung bases GI: tender in the upper abdomen  Lab Results:  Recent Labs  12/24/14 2122 12/25/14 0426  WBC 32.8* 27.4*  HGB 14.3 13.2  HCT 42.6 40.4  PLT 558* 513*   BMET  Recent Labs  12/24/14 2122 12/25/14 0426 12/26/14 0404  NA 137 134* 142  K 4.5 4.3 4.3  CL 99 99 110  CO2 29 25 24   GLUCOSE 176* 213* 132*  BUN 18 16 15   CREATININE 0.76 0.67 0.69  CALCIUM 9.1 7.7* 6.7*   LFT  Recent Labs  12/26/14 0404  PROT 5.4*  ALBUMIN 2.9*  AST 18  ALT 15  ALKPHOS 58  BILITOT 0.7   PT/INR No results for input(s): LABPROT, INR in the last 72 hours. Hepatitis Panel No results for input(s): HEPBSAG, HCVAB, HEPAIGM, HEPBIGM in the last 72 hours. C-Diff No results for input(s): CDIFFTOX in the last 72 hours. Fecal Lactopherrin No results for input(s): FECLLACTOFRN in the last 72 hours.  Studies/Results: US Abdomen Complete  12/25/2014   CLINICAL DATA:  Subsequent evaluation of pancreatitis patient history of splenectomy breast cancer lymphoma  EXAM: ULTRASOUND ABDOMEN COMPLETE  COMPARISON:  Multiple prior studies  FINDINGS: Gallbladder: No gallstones or wall thickening visualized. No sonographic Murphy sign noted.  Common bile duct: Diameter: 6 mm  Liver: No focal lesion  identified. Within normal limits in parenchymal echogenicity.  IVC: No abnormality visualized.  Pancreas: Hypoechoic and thickened to an antero posterior diameter of 3.5 cm. No pseudocyst identified.  Spleen: Surgically absent  Right Kidney: Length: 9.6 cm. Echogenicity within normal limits. No mass or hydronephrosis visualized.  Left Kidney: Length: 9.4 cm. Echogenicity within normal limits. No mass or hydronephrosis visualized.  Abdominal aorta: No aneurysm visualized. Distal abdominal aorta is obscured by bowel gas.  Other findings: Small right pleural effusion. Small volume of ascites.  IMPRESSION: Findings consistent with pancreatitis. Findings are felt to be similar to those on a CT scan performed 12/24/2014.   Electronically Signed   By: Skipper Cliche M.D.   On: 12/25/2014 09:30   Ct Abdomen Pelvis W Contrast  12/24/2014   CLINICAL DATA:  Acute onset of abdominal cramping, nausea and vomiting. Initial encounter.  EXAM: CT ABDOMEN AND PELVIS WITH CONTRAST  TECHNIQUE: Multidetector CT imaging of the abdomen and pelvis was performed using the standard protocol following bolus administration of intravenous contrast.  CONTRAST:  123mL OMNIPAQUE IOHEXOL 300 MG/ML  SOLN  COMPARISON:  CT of the abdomen and pelvis from 01/14/2011  FINDINGS: A small left pleural effusion is noted, with associated atelectasis. The patient is status post left-sided mastectomy. Calcification is noted at the mitral valve.  There is diffuse soft tissue inflammation and fluid about the entirety of the pancreas, with apparent mild devascularization involving the body of the pancreas. No  definite pseudocyst formation is seen. A moderate amount of surrounding free fluid tracks anterior to Gerota's fascia bilaterally, extends about the liver, and extends inferiorly along the paracolic gutters into the pelvis.  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 The patient is status post splenectomy.  The gallbladder is within normal limits, though difficult to fully assess given surrounding fluid.  Prominent postoperative change is seen about the infrarenal abdominal aorta and right upper quadrant.  Tiny bilateral renal cysts are suggested. Minimal nonspecific left-sided perinephric stranding is seen. There is no evidence of hydronephrosis. No renal or ureteral stones are seen.  No free fluid is identified. The small bowel is unremarkable in appearance. The stomach is within normal limits. No acute vascular abnormalities are seen. Mild calcification is seen at the origins of the celiac trunk and bilateral renal arteries.  The appendix is not definitely seen; there is no evidence of appendicitis. The colon is grossly unremarkable in appearance.  The bladder is mildly distended and grossly unremarkable. The uterus is grossly unremarkable. Postoperative change is seen at both adnexa. No suspicious adnexal masses are seen. No inguinal lymphadenopathy is seen.  No acute osseous abnormalities are identified.  IMPRESSION: 1. Significant acute pancreatitis, with apparent mild devascularization at the body of the pancreas. No definite pseudocyst formation seen. Moderate amount of surrounding free fluid tracks anterior to Gerota's fascia bilaterally, extends about the liver, and extends inferiorly along the paracolic gutters into the pelvis. 2. Small left pleural effusion, with associated atelectasis. 3. 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 4. Tiny bilateral renal cysts suggested. 5. Mild calcification at the origins of the celiac trunk and bilateral renal arteries. 6. Calcification incidentally noted at the mitral valve.  These results were called by telephone at the time of interpretation on 12/24/2014 at 11:55 pm to Dr. Quintella Reichert, who verbally acknowledged these results.   Electronically Signed   By: Garald Balding M.D.   On: 12/24/2014 23:56    Medications:   Scheduled: . antiseptic oral rinse  7 mL Mouth Rinse q12n4p  . chlorhexidine  15 mL Mouth Rinse BID  . latanoprost  1 drop Both Eyes QHS  . metoprolol  2.5 mg Intravenous Q12H  . sodium chloride  3 mL Intravenous Q12H   Continuous:   Assessment/Plan: 1) Acute pancreatitis. 2) SOB.   The patient may have mild ARDS versus hypoventilation with the pain medication.  She has responded to the use of oxygen.  Plan: 1) CXR. 2) Continue with IV hydration. 3) Continue with pain control/ 4) NPO.  LOS: 2 days   Abou Sterkel D 12/26/2014, 10:39 AM

## 2014-12-26 NOTE — Progress Notes (Signed)
[facility] PROGRESS NOTE  Patricia [name] [id number]:[id number] DOB: May 05, 1951 DOA: 12/24/2014 PCP: Tommy Medal, MD  Assessment/Plan: 1-acute pancreatitis: unclear etiology; presumed to be associated with biliary source according to GI.  -continue NPO (ok for ice chips) -IVF's -PRN antiemetics and analgesics -follow response for diet advancement -lipase is trending down -will continue PPI -per GI no antibiotics at this point -EUS in 2 months  2-HTN: continue metoprolol IV -stable SBP  3-hypoxia: transient and around pain meds -could be secondary to atelectasis vs pain meds -continue O2 supplementation -will start flutter valve  4-glaucoma: continue xalatan  5-GERD: GI prophylaxis: will use PPI   Code Status: DNR Family Communication: no family at bedside Disposition Plan: home when medically stable    Consultants:  GI (Dr. Benson Norway)  Procedures:  See below for x-ray reports   Antibiotics:  cipro and flagyl 1/13>>1/14  HPI/Subjective: Afebrile, no CP; mid desat after pain meds overnight (responded well to O2 supplementation). Continue complaining of abd pain, no vomiting, but is nauseated.  Objective: Filed Vitals:   12/26/14 0652  BP: 155/65  Pulse: 119  Temp: 99.1 F (37.3 C)  Resp: 18    Intake/Output Summary (Last 24 hours) at 12/26/14 1045 Last data filed at 12/26/14 0300  Gross per 24 hour  Intake 3087.5 ml  Output      2 ml  Net 3085.5 ml   Filed Weights   12/25/14 0125  Weight: 63.866 kg (140 lb 12.8 oz)    Exam:   General:  Afebrile, no vomiting, no major distress. Still with abd pain  Cardiovascular: S1 and s2, no rubs or gallops  Respiratory: good air movement, no wheezing or crackles  Abdomen: positive guarding, hypoactive BS and TTP (epigastric area)  Musculoskeletal: no cyanosis or clubbing   Data Reviewed: Basic Metabolic Panel:  Recent Labs Lab 12/24/14 2122 12/25/14 0426 12/26/14 0404  NA 137 134* 142  K 4.5  4.3 4.3  CL 99 99 110  CO2 29 25 24   GLUCOSE 176* 213* 132*  BUN 18 16 15   CREATININE 0.76 0.67 0.69  CALCIUM 9.1 7.7* 6.7*  MG  --  1.8  --   PHOS  --  4.4  --    Liver Function Tests:  Recent Labs Lab 12/24/14 2122 12/25/14 0426 12/26/14 0404  AST 28 21 18   ALT 26 20 15   ALKPHOS 79 65 58  BILITOT 0.8 0.5 0.7  PROT 6.9 6.0 5.4*  ALBUMIN 4.3 3.6 2.9*    Recent Labs Lab 12/24/14 2122 12/25/14 0426 12/26/14 0404  LIPASE 1170* 1218* 475*   CBC:  Recent Labs Lab 12/24/14 2122 12/25/14 0426  WBC 32.8* 27.4*  NEUTROABS 30.2*  --   HGB 14.3 13.2  HCT 42.6 40.4  MCV 89.3 90.0  PLT 558* 513*    Recent Results (from the past 240 hour(s))  Urine culture     Status: None   Collection Time: 12/25/14 12:23 AM  Result Value Ref Range Status   Specimen Description URINE, CLEAN CATCH  Final   Special Requests NONE  Final   Colony Count NO GROWTH Performed at [facility]   Final   Culture NO GROWTH Performed at [facility]   Final   Report Status 12/26/2014 FINAL  Final  Culture, blood (routine x 2)     Status: None (Preliminary result)   Collection Time: 12/25/14  1:00 AM  Result Value Ref Range Status   Specimen Description BLOOD RIGHT FOREARM  Final  Special Requests BOTTLES DRAWN AEROBIC AND ANAEROBIC 5CC  Final   Culture   Final           BLOOD CULTURE RECEIVED NO GROWTH TO DATE CULTURE WILL BE HELD FOR 5 DAYS BEFORE ISSUING A FINAL NEGATIVE REPORT Performed at [facility]    Report Status PENDING  Incomplete  Culture, blood (routine x 2)     Status: None (Preliminary result)   Collection Time: 12/25/14  1:02 AM  Result Value Ref Range Status   Specimen Description BLOOD RIGHT HAND  Final   Special Requests BOTTLES DRAWN AEROBIC AND ANAEROBIC 5CC  Final   Culture   Final           BLOOD CULTURE RECEIVED NO GROWTH TO DATE CULTURE WILL BE HELD FOR 5 DAYS BEFORE ISSUING A FINAL NEGATIVE REPORT Performed at [facility]     Report Status PENDING  Incomplete     Studies: US Abdomen Complete  12/25/2014   CLINICAL DATA:  Subsequent evaluation of pancreatitis patient history of splenectomy breast cancer lymphoma  EXAM: ULTRASOUND ABDOMEN COMPLETE  COMPARISON:  Multiple prior studies  FINDINGS: Gallbladder: No gallstones or wall thickening visualized. No sonographic Murphy sign noted.  Common bile duct: Diameter: 6 mm  Liver: No focal lesion identified. Within normal limits in parenchymal echogenicity.  IVC: No abnormality visualized.  Pancreas: Hypoechoic and thickened to an antero posterior diameter of 3.5 cm. No pseudocyst identified.  Spleen: Surgically absent  Right Kidney: Length: 9.6 cm. Echogenicity within normal limits. No mass or hydronephrosis visualized.  Left Kidney: Length: 9.4 cm. Echogenicity within normal limits. No mass or hydronephrosis visualized.  Abdominal aorta: No aneurysm visualized. Distal abdominal aorta is obscured by bowel gas.  Other findings: Small right pleural effusion. Small volume of ascites.  IMPRESSION: Findings consistent with pancreatitis. Findings are felt to be similar to those on a CT scan performed 12/24/2014.   Electronically Signed   By: Skipper Cliche M.D.   On: 12/25/2014 09:30   Ct Abdomen Pelvis W Contrast  12/24/2014   CLINICAL DATA:  Acute onset of abdominal cramping, nausea and vomiting. Initial encounter.  EXAM: CT ABDOMEN AND PELVIS WITH CONTRAST  TECHNIQUE: Multidetector CT imaging of the abdomen and pelvis was performed using the standard protocol following bolus administration of intravenous contrast.  CONTRAST:  129mL OMNIPAQUE IOHEXOL 300 MG/ML  SOLN  COMPARISON:  CT of the abdomen and pelvis from 01/14/2011  FINDINGS: A small left pleural effusion is noted, with associated atelectasis. The patient is status post left-sided mastectomy. Calcification is noted at the mitral valve.  There is diffuse soft tissue inflammation and fluid about the entirety of the pancreas,  with apparent mild devascularization involving the body of the pancreas. No definite pseudocyst formation is seen. A moderate amount of surrounding free fluid tracks anterior to Gerota's fascia bilaterally, extends about the liver, and extends inferiorly along the paracolic gutters into the pelvis.  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 The patient is status post splenectomy. The gallbladder is within normal limits, though difficult to fully assess given surrounding fluid.  Prominent postoperative change is seen about the infrarenal abdominal aorta and right upper quadrant.  Tiny bilateral renal cysts are suggested. Minimal nonspecific left-sided perinephric stranding is seen. There is no evidence of hydronephrosis. No renal or ureteral stones are seen.  No free fluid is identified. The small bowel is unremarkable in appearance. The stomach is within normal limits. No  acute vascular abnormalities are seen. Mild calcification is seen at the origins of the celiac trunk and bilateral renal arteries.  The appendix is not definitely seen; there is no evidence of appendicitis. The colon is grossly unremarkable in appearance.  The bladder is mildly distended and grossly unremarkable. The uterus is grossly unremarkable. Postoperative change is seen at both adnexa. No suspicious adnexal masses are seen. No inguinal lymphadenopathy is seen.  No acute osseous abnormalities are identified.  IMPRESSION: 1. Significant acute pancreatitis, with apparent mild devascularization at the body of the pancreas. No definite pseudocyst formation seen. Moderate amount of surrounding free fluid tracks anterior to Gerota's fascia bilaterally, extends about the liver, and extends inferiorly along the paracolic gutters into the pelvis. 2. Small left pleural effusion, with associated atelectasis. 3. 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 4. Tiny  bilateral renal cysts suggested. 5. Mild calcification at the origins of the celiac trunk and bilateral renal arteries. 6. Calcification incidentally noted at the mitral valve.  These results were called by telephone at the time of interpretation on 12/24/2014 at 11:55 pm to Dr. Quintella Reichert, who verbally acknowledged these results.   Electronically Signed   By: Garald Balding M.D.   On: 12/24/2014 23:56    Scheduled Meds: . antiseptic oral rinse  7 mL Mouth Rinse q12n4p  . chlorhexidine  15 mL Mouth Rinse BID  . latanoprost  1 drop Both Eyes QHS  . metoprolol  2.5 mg Intravenous Q12H  . sodium chloride  3 mL Intravenous Q12H   Continuous Infusions: . dextrose 5 % and 0.45% NaCl      Active Problems:   HTN (hypertension)   Acute pancreatitis    Time spent: 30 minutes    Barton Dubois  [facility] Pager [phone]. If 7PM-7AM, please contact night-coverage at [email or web address], password [facility] 12/26/2014, 10:45 AM  LOS: 2 days

## 2014-12-27 DIAGNOSIS — R0602 Shortness of breath: Secondary | ICD-10-CM

## 2014-12-27 DIAGNOSIS — K859 Acute pancreatitis, unspecified: Principal | ICD-10-CM

## 2014-12-27 MED ORDER — METOPROLOL TARTRATE 1 MG/ML IV SOLN
2.5000 mg | Freq: Three times a day (TID) | INTRAVENOUS | Status: DC
  Administered 2014-12-27 – 2014-12-28 (×3): 2.5 mg via INTRAVENOUS
  Filled 2014-12-27 (×3): qty 5

## 2014-12-27 NOTE — Progress Notes (Signed)
[facility] PROGRESS NOTE  AMULYA QUINTIN GDJ:[id number] DOB: 1950/12/23 DOA: 12/24/2014 PCP: Tommy Medal, MD  Assessment/Plan: 1-acute pancreatitis: unclear etiology; presumed to be associated with biliary source according to GI.  -IVF's; increase rate to 150cc as per GI rec's -PRN antiemetics and analgesics -follow response for diet advancement; for now continue NPO -lipase is trending down; will recheck levels in am -will continue PPI -per GI no antibiotics at this point; -potential MRCP prior to discharge; -EUS in 2 months -will follow GI rec's  2-HTN: continue metoprolol IV; dose adjusted to help with sinus tachcycardia  -stable SBP  3-hypoxia: transient and around pain meds -could be secondary to atelectasis vs pain meds -continue O2 supplementation as needed -CXR demonstrated atelectasis; will start flutter valve  4-glaucoma: continue xalatan  5-GERD: GI prophylaxis: will continue use PPI   Code Status: DNR Family Communication: husband at bedside Disposition Plan: home when medically stable    Consultants:  GI (Dr. Benson Norway)  Procedures:  See below for x-ray reports   Antibiotics:  cipro and flagyl 1/13>>1/14  HPI/Subjective: Afebrile, no CP; continue to have pain in her abd. Patient w/o appetite  Objective: Filed Vitals:   12/27/14 1300  BP: 155/64  Pulse: 122  Temp: 99.4 F (37.4 C)  Resp: 18    Intake/Output Summary (Last 24 hours) at 12/27/14 1347 Last data filed at 12/27/14 0655  Gross per 24 hour  Intake 1452.5 ml  Output    250 ml  Net 1202.5 ml   Filed Weights   12/25/14 0125  Weight: 63.866 kg (140 lb 12.8 oz)    Exam:   General:  Afebrile, no vomiting, no major distress. Still with abd pain  Cardiovascular: S1 and s2, no rubs or gallops  Respiratory: good air movement, no wheezing or crackles  Abdomen: positive guarding and tenderness still appreciated, hypoactive to absent BS   Musculoskeletal: no cyanosis or  clubbing   Data Reviewed: Basic Metabolic Panel:  Recent Labs Lab 12/24/14 2122 12/25/14 0426 12/26/14 0404  NA 137 134* 142  K 4.5 4.3 4.3  CL 99 99 110  CO2 29 25 24   GLUCOSE 176* 213* 132*  BUN 18 16 15   CREATININE 0.76 0.67 0.69  CALCIUM 9.1 7.7* 6.7*  MG  --  1.8  --   PHOS  --  4.4  --    Liver Function Tests:  Recent Labs Lab 12/24/14 2122 12/25/14 0426 12/26/14 0404  AST 28 21 18   ALT 26 20 15   ALKPHOS 79 65 58  BILITOT 0.8 0.5 0.7  PROT 6.9 6.0 5.4*  ALBUMIN 4.3 3.6 2.9*    Recent Labs Lab 12/24/14 2122 12/25/14 0426 12/26/14 0404  LIPASE 1170* 1218* 475*   CBC:  Recent Labs Lab 12/24/14 2122 12/25/14 0426  WBC 32.8* 27.4*  NEUTROABS 30.2*  --   HGB 14.3 13.2  HCT 42.6 40.4  MCV 89.3 90.0  PLT 558* 513*    Recent Results (from the past 240 hour(s))  Urine culture     Status: None   Collection Time: 12/25/14 12:23 AM  Result Value Ref Range Status   Specimen Description URINE, CLEAN CATCH  Final   Special Requests NONE  Final   Colony Count NO GROWTH Performed at [facility]   Final   Culture NO GROWTH Performed at [facility]   Final   Report Status 12/26/2014 FINAL  Final  Culture, blood (routine x 2)     Status: None (Preliminary result)  Collection Time: 12/25/14  1:00 AM  Result Value Ref Range Status   Specimen Description BLOOD RIGHT FOREARM  Final   Special Requests BOTTLES DRAWN AEROBIC AND ANAEROBIC 5CC  Final   Culture   Final           BLOOD CULTURE RECEIVED NO GROWTH TO DATE CULTURE WILL BE HELD FOR 5 DAYS BEFORE ISSUING A FINAL NEGATIVE REPORT Performed at [facility]    Report Status PENDING  Incomplete  Culture, blood (routine x 2)     Status: None (Preliminary result)   Collection Time: 12/25/14  1:02 AM  Result Value Ref Range Status   Specimen Description BLOOD RIGHT HAND  Final   Special Requests BOTTLES DRAWN AEROBIC AND ANAEROBIC 5CC  Final   Culture   Final           BLOOD  CULTURE RECEIVED NO GROWTH TO DATE CULTURE WILL BE HELD FOR 5 DAYS BEFORE ISSUING A FINAL NEGATIVE REPORT Performed at [facility]    Report Status PENDING  Incomplete     Studies: Dg Chest 2 View  12/26/2014   CLINICAL DATA:  Shortness of breath, low-grade fever  EXAM: CHEST  2 VIEW  COMPARISON:  10/28/2013  FINDINGS: Cardiomediastinal silhouette is stable. There is bilateral small pleural effusion left greater than right with bilateral basilar atelectasis or infiltrate. No pulmonary edema. Atherosclerotic calcifications of thoracic aorta again noted. Surgical clips in left mid abdomen. Surgical clips midline lower abdomen.  IMPRESSION: No pulmonary edema. Bilateral small pleural effusion left greater than right with bilateral basilar atelectasis or infiltrate.   Electronically Signed   By: Lahoma Crocker M.D.   On: 12/26/2014 11:21    Scheduled Meds: . antiseptic oral rinse  7 mL Mouth Rinse q12n4p  . chlorhexidine  15 mL Mouth Rinse BID  . latanoprost  1 drop Both Eyes QHS  . metoprolol  2.5 mg Intravenous 3 times per day  . pantoprazole (PROTONIX) IV  40 mg Intravenous Q24H  . sodium chloride  3 mL Intravenous Q12H   Continuous Infusions: . dextrose 5 % and 0.45% NaCl 150 mL/hr at 12/27/14 1252    Active Problems:   HTN (hypertension)   Acute pancreatitis    Time spent: 30 minutes    Barton Dubois  [facility] Pager [phone]. If 7PM-7AM, please contact night-coverage at [email or web address], password [facility] 12/27/2014, 1:47 PM  LOS: 3 days

## 2014-12-27 NOTE — Progress Notes (Signed)
Patient ID: Patricia [name], female   DOB: September 10, 1951, 64 y.o.   MRN: [id number]  [facility] Progress Note  Subjective: Feels about the same as on admit- no better. Still having 8/10 pain, meds help. No appetite SOB with movement- sats in 80's at night, uncomfortable with deep breath  CXR- bilateral atelectasis  Objective:  Vital signs in last 24 hours: Temp:  [98.6 F (37 C)-99.1 F (37.3 C)] 99.1 F (37.3 C) (01/16 0607) Pulse Rate:  [120-146] 137 (01/16 0607) Resp:  [18-20] 20 (01/16 0607) BP: (120-151)/(51-65) 138/65 mmHg (01/16 0607) SpO2:  [85 %-99 %] 92 % (01/16 0607) Last BM Date: 12/24/14 General:   Alert,  Well-developed, WF   in NAD, uncomfortable  appearing Heart: tachy Regular rate and rhythm; no murmurs  120s Pulm;decreased BS bilat Abdomen:  Full ,diffusely tender with guarding , Bs quiet Extremities:  Without edema. Neurologic:  Alert and  oriented x4;  grossly normal neurologically. Psych:  Alert and cooperative. Normal mood and affect.  Intake/Output from previous day: 01/15 0701 - 01/16 0700 In: 1452.5 [I.V.:1452.5] Out: 250 [Urine:250] Intake/Output this shift:    Lab Results:  Lipase 475  Recent Labs  12/24/14 2122 12/25/14 0426  WBC 32.8* 27.4*  HGB 14.3 13.2  HCT 42.6 40.4  PLT 558* 513*   BMET  Recent Labs  12/24/14 2122 12/25/14 0426 12/26/14 0404  NA 137 134* 142  K 4.5 4.3 4.3  CL 99 99 110  CO2 29 25 24   GLUCOSE 176* 213* 132*  BUN 18 16 15   CREATININE 0.76 0.67 0.69  CALCIUM 9.1 7.7* 6.7*   LFT  Recent Labs  12/26/14 0404  PROT 5.4*  ALBUMIN 2.9*  AST 18  ALT 15  ALKPHOS 58  BILITOT 0.7   PT/INR No results for input(s): LABPROT, INR in the last 72 hours. Hepatitis Panel No results for input(s): HEPBSAG, HCVAB, HEPAIGM, HEPBIGM in the last 72 hours.  Assessment / Plan:   #1  64 yo female with acute pancreatitis of unclear etiology with SIRS 48 + hours after admit she is tachy, hypoxic and WBC  27000  Needs volume - will push IV fluids up to 150/hour Aggressive pulmonary support- start IS, continue 02 ,watch carefully Keep NPO except ice  Active Problems:   HTN (hypertension)   Acute pancreatitis     LOS: 3 days   Amy Esterwood  12/27/2014, 9:26 AM   GI Attending Note  I have personally taken an interval history, reviewed the chart, and examined the patient.  I agree with the extender's note, impression and recommendations.  Once stabilized patient should undergo MRCP at some point in the near future.  Sandy Salaam. Deatra Ina, MD, [facility] [phone]

## 2014-12-28 DIAGNOSIS — N179 Acute kidney failure, unspecified: Secondary | ICD-10-CM

## 2014-12-28 DIAGNOSIS — E871 Hypo-osmolality and hyponatremia: Secondary | ICD-10-CM | POA: Insufficient documentation

## 2014-12-28 LAB — COMPREHENSIVE METABOLIC PANEL
ALT: 14 U/L (ref 0–35)
AST: 19 U/L (ref 0–37)
Albumin: 2.8 g/dL — ABNORMAL LOW (ref 3.5–5.2)
Alkaline Phosphatase: 76 U/L (ref 39–117)
Anion gap: 9 (ref 5–15)
BUN: 31 mg/dL — ABNORMAL HIGH (ref 6–23)
CO2: 23 mmol/L (ref 19–32)
Calcium: 7.9 mg/dL — ABNORMAL LOW (ref 8.4–10.5)
Chloride: 105 mEq/L (ref 96–112)
Creatinine, Ser: 1.21 mg/dL — ABNORMAL HIGH (ref 0.50–1.10)
GFR calc Af Amer: 54 mL/min — ABNORMAL LOW (ref >90)
GFR calc non Af Amer: 46 mL/min — ABNORMAL LOW (ref >90)
Glucose, Bld: 173 mg/dL — ABNORMAL HIGH (ref 70–99)
Potassium: 3.7 mmol/L (ref 3.5–5.1)
Sodium: 137 mmol/L (ref 135–145)
Total Bilirubin: 0.7 mg/dL (ref 0.3–1.2)
Total Protein: 6 g/dL (ref 6.0–8.3)

## 2014-12-28 LAB — CBC
HCT: 34.3 % — ABNORMAL LOW (ref 36.0–46.0)
Hemoglobin: 11 g/dL — ABNORMAL LOW (ref 12.0–15.0)
MCH: 29.3 pg (ref 26.0–34.0)
MCHC: 32.1 g/dL (ref 30.0–36.0)
MCV: 91.2 fL (ref 78.0–100.0)
Platelets: 474 10*3/uL — ABNORMAL HIGH (ref 150–400)
RBC: 3.76 MIL/uL — ABNORMAL LOW (ref 3.87–5.11)
RDW: 16.2 % — ABNORMAL HIGH (ref 11.5–15.5)
WBC: 27.3 10*3/uL — ABNORMAL HIGH (ref 4.0–10.5)

## 2014-12-28 LAB — BASIC METABOLIC PANEL
Anion gap: 4 — ABNORMAL LOW (ref 5–15)
BUN: 26 mg/dL — ABNORMAL HIGH (ref 6–23)
CO2: 22 mmol/L (ref 19–32)
Calcium: 7.1 mg/dL — ABNORMAL LOW (ref 8.4–10.5)
Chloride: 105 mEq/L (ref 96–112)
Creatinine, Ser: 1.11 mg/dL — ABNORMAL HIGH (ref 0.50–1.10)
GFR calc Af Amer: 59 mL/min — ABNORMAL LOW (ref >90)
GFR calc non Af Amer: 51 mL/min — ABNORMAL LOW (ref >90)
Glucose, Bld: 432 mg/dL — ABNORMAL HIGH (ref 70–99)
Potassium: 3.7 mmol/L (ref 3.5–5.1)
Sodium: 131 mmol/L — ABNORMAL LOW (ref 135–145)

## 2014-12-28 LAB — LIPASE, BLOOD: Lipase: 32 U/L (ref 11–59)

## 2014-12-28 MED ORDER — METOPROLOL TARTRATE 1 MG/ML IV SOLN
5.0000 mg | Freq: Three times a day (TID) | INTRAVENOUS | Status: DC
  Administered 2014-12-28 – 2014-12-31 (×9): 5 mg via INTRAVENOUS
  Filled 2014-12-28 (×10): qty 5

## 2014-12-28 MED ORDER — SODIUM CHLORIDE 0.9 % IV SOLN
INTRAVENOUS | Status: DC
  Administered 2014-12-28: 17:00:00 via INTRAVENOUS

## 2014-12-28 NOTE — Progress Notes (Signed)
     Progress Note   Subjective  Continues with abdominal pain although slightly decreased.  Has moderate dyspnea on exertion.**   Objective  Vital signs in last 24 hours: Temp:  [97.7 F (36.5 C)-99.4 F (37.4 C)] 98.9 F (37.2 C) (01/17 0658) Pulse Rate:  [102-127] 102 (01/17 0658) Resp:  [10-20] 20 (01/17 0658) BP: (128-165)/(56-71) 128/59 mmHg (01/17 0658) SpO2:  [89 %-100 %] 100 % (01/17 0658) Last BM Date: 12/24/14 General:   Alert,  Well-developed,   in NAD Chest-decreased breath sounds bilaterally inferiorly Heart:  Regular rate and rhythm; no murmurs Abdomen:  Soft, nontender and nondistended. Normal bowel sounds, without guarding, and without rebound.   Extremities:  Without edema. Neurologic:  Alert and  oriented x4;  grossly normal neurologically. Psych:  Alert and cooperative. Normal mood and affect.  Intake/Output from previous day: 01/16 0701 - 01/17 0700 In: 2640 [P.O.:240; I.V.:2400] Out: 625 [Urine:625] Intake/Output this shift: Total I/O In: -  Out: 300 [Urine:300]  Lab Results:  Recent Labs  12/28/14 0500  WBC 27.3*  HGB 11.0*  HCT 34.3*  PLT 474*   BMET  Recent Labs  12/26/14 0404 12/28/14 0500 12/28/14 0852  NA 142 137 131*  K 4.3 3.7 3.7  CL 110 105 105  CO2 24 23 22   GLUCOSE 132* 173* 432*  BUN 15 31* 26*  CREATININE 0.69 1.21* 1.11*  CALCIUM 6.7* 7.9* 7.1*   LFT  Recent Labs  12/28/14 0500  PROT 6.0  ALBUMIN 2.8*  AST 19  ALT 14  ALKPHOS 76  BILITOT 0.7   PT/INR No results for input(s): LABPROT, INR in the last 72 hours. Hepatitis Panel No results for input(s): HEPBSAG, HCVAB, HEPAIGM, HEPBIGM in the last 72 hours.  Studies/Results: Dg Chest 2 View  12/26/2014   CLINICAL DATA:  Shortness of breath, low-grade fever  EXAM: CHEST  2 VIEW  COMPARISON:  10/28/2013  FINDINGS: Cardiomediastinal silhouette is stable. There is bilateral small pleural effusion left greater than right with bilateral basilar atelectasis or  infiltrate. No pulmonary edema. Atherosclerotic calcifications of thoracic aorta again noted. Surgical clips in left mid abdomen. Surgical clips midline lower abdomen.  IMPRESSION: No pulmonary edema. Bilateral small pleural effusion left greater than right with bilateral basilar atelectasis or infiltrate.   Electronically Signed   By: Lahoma Crocker M.D.   On: 12/26/2014 11:21      Assessment & Plan  *Acute pancreatitis with probable sirs.  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  Continue vigorous IV fluid support while watching pulmonary function.  No need for antibiotics at this time despite leukocytosis.  Active Problems:   HTN (hypertension)   Acute pancreatitis   SOB (shortness of breath)     LOS: 4 days   Erskine Emery  12/28/2014, 10:05 AM

## 2014-12-28 NOTE — Progress Notes (Signed)
[facility] PROGRESS NOTE  Patricia [name] ZDG:[id number] DOB: Feb 18, 1951 DOA: 12/24/2014 PCP: Tommy Medal, MD  Assessment/Plan: 1-acute pancreatitis with SIRS: unclear etiology; presumed to be associated with biliary source according to GI.  -IVF's; increase rate to 150cc as per GI rec's -PRN antiemetics and analgesics -follow response for diet advancement; for now continue NPO -lipase is WNL; will recheck levels in am -will continue PPI -per GI no antibiotics at this point; -potential MRCP; -EUS in 2 months -will follow GI rec's  2-HTN: continue metoprolol IV; dose adjusted to help with sinus tachcycardia  -stable SBP  3-hypoxia: transient and around pain meds -could be secondary to atelectasis vs pain meds -continue O2 supplementation as needed -CXR demonstrated atelectasis; will continue flutter valve  4-glaucoma: continue xalatan  5-GERD: GI prophylaxis: will continue use PPI  6-hyperglycemia: given pancreatitis and IVF's resuscitation with D5 -will change D5 fluids to NS   7-acute kidney injury: due to 3rd spacing -will continue IVF's -follow BMET  8-hyponatremia: -due to hyperglycemia -will change IVF's -follow electrolytes   Code Status: DNR Family Communication: husband at bedside Disposition Plan: home when medically stable    Consultants:  GI (Dr. Benson Norway)  Procedures:  See below for x-ray reports   Antibiotics:  cipro and flagyl 1/13>>1/14  HPI/Subjective: Afebrile, no CP; continue to experience abd pain (even has improved some). Patient is also reporting some SOB with activity   Objective: Filed Vitals:   12/28/14 1335  BP: 175/85  Pulse: 128  Temp: 98.8 F (37.1 C)  Resp: 20    Intake/Output Summary (Last 24 hours) at 12/28/14 1628 Last data filed at 12/28/14 1500  Gross per 24 hour  Intake   3840 ml  Output   1150 ml  Net   2690 ml   Filed Weights   12/25/14 0125  Weight: 63.866 kg (140 lb 12.8 oz)     Exam:   General:  Afebrile, no vomiting, no major distress. Still with abd pain (7/10)  Cardiovascular: S1 and s2, no rubs or gallops  Respiratory: good air movement, no wheezing or crackles  Abdomen: positive guarding and tenderness still appreciated, hypoactive to absent BS   Musculoskeletal: no cyanosis or clubbing   Data Reviewed: Basic Metabolic Panel:  Recent Labs Lab 12/24/14 2122 12/25/14 0426 12/26/14 0404 12/28/14 0500 12/28/14 0852  NA 137 134* 142 137 131*  K 4.5 4.3 4.3 3.7 3.7  CL 99 99 110 105 105  CO2 29 25 24 23 22   GLUCOSE 176* 213* 132* 173* 432*  BUN 18 16 15  31* 26*  CREATININE 0.76 0.67 0.69 1.21* 1.11*  CALCIUM 9.1 7.7* 6.7* 7.9* 7.1*  MG  --  1.8  --   --   --   PHOS  --  4.4  --   --   --    Liver Function Tests:  Recent Labs Lab 12/24/14 2122 12/25/14 0426 12/26/14 0404 12/28/14 0500  AST 28 21 18 19   ALT 26 20 15 14   ALKPHOS 79 65 58 76  BILITOT 0.8 0.5 0.7 0.7  PROT 6.9 6.0 5.4* 6.0  ALBUMIN 4.3 3.6 2.9* 2.8*    Recent Labs Lab 12/24/14 2122 12/25/14 0426 12/26/14 0404 12/28/14 0500  LIPASE 1170* 1218* 475* 32   CBC:  Recent Labs Lab 12/24/14 2122 12/25/14 0426 12/28/14 0500  WBC 32.8* 27.4* 27.3*  NEUTROABS 30.2*  --   --   HGB 14.3 13.2 11.0*  HCT 42.6 40.4 34.3*  MCV 89.3 90.0 91.2  PLT 558* 513* 474*    Recent Results (from the past 240 hour(s))  Urine culture     Status: None   Collection Time: 12/25/14 12:23 AM  Result Value Ref Range Status   Specimen Description URINE, CLEAN CATCH  Final   Special Requests NONE  Final   Colony Count NO GROWTH Performed at [facility]   Final   Culture NO GROWTH Performed at [facility]   Final   Report Status 12/26/2014 FINAL  Final  Culture, blood (routine x 2)     Status: None (Preliminary result)   Collection Time: 12/25/14  1:00 AM  Result Value Ref Range Status   Specimen Description BLOOD RIGHT FOREARM  Final   Special Requests  BOTTLES DRAWN AEROBIC AND ANAEROBIC 5CC  Final   Culture   Final           BLOOD CULTURE RECEIVED NO GROWTH TO DATE CULTURE WILL BE HELD FOR 5 DAYS BEFORE ISSUING A FINAL NEGATIVE REPORT Performed at [facility]    Report Status PENDING  Incomplete  Culture, blood (routine x 2)     Status: None (Preliminary result)   Collection Time: 12/25/14  1:02 AM  Result Value Ref Range Status   Specimen Description BLOOD RIGHT HAND  Final   Special Requests BOTTLES DRAWN AEROBIC AND ANAEROBIC 5CC  Final   Culture   Final           BLOOD CULTURE RECEIVED NO GROWTH TO DATE CULTURE WILL BE HELD FOR 5 DAYS BEFORE ISSUING A FINAL NEGATIVE REPORT Performed at [facility]    Report Status PENDING  Incomplete     Studies: No results found.  Scheduled Meds: . antiseptic oral rinse  7 mL Mouth Rinse q12n4p  . chlorhexidine  15 mL Mouth Rinse BID  . latanoprost  1 drop Both Eyes QHS  . metoprolol  5 mg Intravenous 3 times per day  . pantoprazole (PROTONIX) IV  40 mg Intravenous Q24H  . sodium chloride  3 mL Intravenous Q12H   Continuous Infusions: . sodium chloride      Active Problems:   HTN (hypertension)   Acute pancreatitis   SOB (shortness of breath)    Time spent: 30 minutes    Barton Dubois  [facility] Pager [phone]. If 7PM-7AM, please contact night-coverage at [email or web address], password [facility] 12/28/2014, 4:28 PM  LOS: 4 days

## 2014-12-29 LAB — BASIC METABOLIC PANEL
Anion gap: 13 (ref 5–15)
BUN: 22 mg/dL (ref 6–23)
CO2: 19 mmol/L (ref 19–32)
Calcium: 8.6 mg/dL (ref 8.4–10.5)
Chloride: 109 mEq/L (ref 96–112)
Creatinine, Ser: 0.82 mg/dL (ref 0.50–1.10)
GFR calc Af Amer: 86 mL/min — ABNORMAL LOW (ref >90)
GFR calc non Af Amer: 74 mL/min — ABNORMAL LOW (ref >90)
Glucose, Bld: 108 mg/dL — ABNORMAL HIGH (ref 70–99)
Potassium: 4.1 mmol/L (ref 3.5–5.1)
Sodium: 141 mmol/L (ref 135–145)

## 2014-12-29 MED ORDER — SODIUM CHLORIDE 0.9 % IV SOLN
INTRAVENOUS | Status: DC
  Administered 2014-12-29 (×2): via INTRAVENOUS

## 2014-12-29 NOTE — Progress Notes (Signed)
Subjective: Patient seems to be doing much better today. She still has some abdominal pain with minimal nausea. Had a small volume BM today. On ice chips only for now.  Objective: Vital signs in last 24 hours: Temp:  [98.1 F (36.7 C)-98.5 F (36.9 C)] 98.5 F (36.9 C) (01/18 1403) Pulse Rate:  [116-127] 116 (01/18 1525) Resp:  [20] 20 (01/18 1403) BP: (158-167)/(65-78) 165/77 mmHg (01/18 1525) SpO2:  [98 %-100 %] 100 % (01/18 0527) Last BM Date: 12/28/14  Intake/Output from previous day: 01/17 0701 - 01/18 0700 In: 5732 [P.O.:270; I.V.:3600] Out: 1175 [Urine:1175] Intake/Output this shift:    General appearance: alert, cooperative, appears stated age, fatigued, no distress and pale Resp: clear to auscultation bilaterally Cardio: tachycardic S1, S2 normal, no click, rub or gallop; systolic murmur in the mid-axillary region  GI: soft, non-tender; hypoactive bowel sounds normal; no masses,  no organomegaly Extremities: extremities normal, atraumatic, no cyanosis or edema  Lab Results:  Recent Labs  12/28/14 0500  WBC 27.3*  HGB 11.0*  HCT 34.3*  PLT 474*   BMET  Recent Labs  12/28/14 0500 12/28/14 0852 12/29/14 0417  NA 137 131* 141  K 3.7 3.7 4.1  CL 105 105 109  CO2 23 22 19   GLUCOSE 173* 432* 108*  BUN 31* 26* 22  CREATININE 1.21* 1.11* 0.82  CALCIUM 7.9* 7.1* 8.6   LFT  Recent Labs  12/28/14 0500  PROT 6.0  ALBUMIN 2.8*  AST 19  ALT 14  ALKPHOS 76  BILITOT 0.7   Medications: I have reviewed the patient's current medications.  Assessment/Plan: 1) Acute pancreatitis with SIRS-etiology unclear. Agree with present care for now.  2) GERD/History of a duodenal ulcer on PPI's.  3) HTN/Tachcardia. 4) AKI resolved after hydration.  LOS: 5 days   Praise Stennett 12/29/2014, 5:28 PM

## 2014-12-29 NOTE — Progress Notes (Signed)
[facility] PROGRESS NOTE  Patricia [name] IRJ:[id number] DOB: Mar 22, 1951 DOA: 12/24/2014 PCP: Tommy Medal, MD  Assessment/Plan: 1-acute pancreatitis with SIRS: unclear etiology; presumed to be associated with biliary source according to GI.  -IVF's; adjust IVF's to 100cc/hr -continue PRN antiemetics and analgesics -lipase is WNL now; will continue NPO status for 1 more day and if continue improving will try CLD -will continue PPI -per GI no antibiotics at this point; -potential MRCP; -EUS in 2 months -will follow GI rec's  2-HTN: continue metoprolol IV; dose adjusted to help with sinus tachcycardia  -stable SBP  3-hypoxia: transient and around pain meds -could be secondary to atelectasis vs pain meds -continue O2 supplementation as needed -CXR demonstrated atelectasis; will continue flutter valve  4-glaucoma: continue xalatan  5-GERD: GI prophylaxis: will continue use PPI  6-hyperglycemia: significantly improved after changing type of IVF -CBG's in the 130 now  7-acute kidney injury: due to 3rd spacing -resolved with IVF's  8-hyponatremia: -resolved after changing type of IVF   Code Status: DNR Family Communication: husband at bedside Disposition Plan: home when medically stable    Consultants:  GI (Dr. Benson Norway)  Procedures:  See below for x-ray reports   Antibiotics:  cipro and flagyl 1/13>>1/14  HPI/Subjective: Afebrile, no CP; continue to experience abd pain (but reported is much better). Patient continue to experience intermittent SOB   Objective: Filed Vitals:   12/29/14 0527  BP: 167/78  Pulse: 124  Temp: 98.5 F (36.9 C)  Resp: 20    Intake/Output Summary (Last 24 hours) at 12/29/14 1401 Last data filed at 12/29/14 0700  Gross per 24 hour  Intake   3870 ml  Output    650 ml  Net   3220 ml   Filed Weights   12/25/14 0125  Weight: 63.866 kg (140 lb 12.8 oz)    Exam:   General:  Afebrile, no vomiting, no major distress.  reports abd pain is still present but improving. Patient had small BM.  Cardiovascular: S1 and s2, no rubs or gallops  Respiratory: good air movement, no wheezing or crackles  Abdomen: positive guarding and on exam abd is less tender, hypoactive to absent BS   Musculoskeletal: no cyanosis or clubbing   Data Reviewed: Basic Metabolic Panel:  Recent Labs Lab 12/25/14 0426 12/26/14 0404 12/28/14 0500 12/28/14 0852 12/29/14 0417  NA 134* 142 137 131* 141  K 4.3 4.3 3.7 3.7 4.1  CL 99 110 105 105 109  CO2 25 24 23 22 19   GLUCOSE 213* 132* 173* 432* 108*  BUN 16 15 31* 26* 22  CREATININE 0.67 0.69 1.21* 1.11* 0.82  CALCIUM 7.7* 6.7* 7.9* 7.1* 8.6  MG 1.8  --   --   --   --   PHOS 4.4  --   --   --   --    Liver Function Tests:  Recent Labs Lab 12/24/14 2122 12/25/14 0426 12/26/14 0404 12/28/14 0500  AST 28 21 18 19   ALT 26 20 15 14   ALKPHOS 79 65 58 76  BILITOT 0.8 0.5 0.7 0.7  PROT 6.9 6.0 5.4* 6.0  ALBUMIN 4.3 3.6 2.9* 2.8*    Recent Labs Lab 12/24/14 2122 12/25/14 0426 12/26/14 0404 12/28/14 0500  LIPASE 1170* 1218* 475* 32   CBC:  Recent Labs Lab 12/24/14 2122 12/25/14 0426 12/28/14 0500  WBC 32.8* 27.4* 27.3*  NEUTROABS 30.2*  --   --   HGB 14.3 13.2 11.0*  HCT 42.6 40.4 34.3*  MCV 89.3  90.0 91.2  PLT 558* 513* 474*    Recent Results (from the past 240 hour(s))  Urine culture     Status: None   Collection Time: 12/25/14 12:23 AM  Result Value Ref Range Status   Specimen Description URINE, CLEAN CATCH  Final   Special Requests NONE  Final   Colony Count NO GROWTH Performed at [facility]   Final   Culture NO GROWTH Performed at [facility]   Final   Report Status 12/26/2014 FINAL  Final  Culture, blood (routine x 2)     Status: None (Preliminary result)   Collection Time: 12/25/14  1:00 AM  Result Value Ref Range Status   Specimen Description BLOOD RIGHT FOREARM  Final   Special Requests BOTTLES DRAWN AEROBIC AND  ANAEROBIC 5CC  Final   Culture   Final           BLOOD CULTURE RECEIVED NO GROWTH TO DATE CULTURE WILL BE HELD FOR 5 DAYS BEFORE ISSUING A FINAL NEGATIVE REPORT Performed at [facility]    Report Status PENDING  Incomplete  Culture, blood (routine x 2)     Status: None (Preliminary result)   Collection Time: 12/25/14  1:02 AM  Result Value Ref Range Status   Specimen Description BLOOD RIGHT HAND  Final   Special Requests BOTTLES DRAWN AEROBIC AND ANAEROBIC 5CC  Final   Culture   Final           BLOOD CULTURE RECEIVED NO GROWTH TO DATE CULTURE WILL BE HELD FOR 5 DAYS BEFORE ISSUING A FINAL NEGATIVE REPORT Performed at [facility]    Report Status PENDING  Incomplete     Studies: No results found.  Scheduled Meds: . antiseptic oral rinse  7 mL Mouth Rinse q12n4p  . chlorhexidine  15 mL Mouth Rinse BID  . latanoprost  1 drop Both Eyes QHS  . metoprolol  5 mg Intravenous 3 times per day  . pantoprazole (PROTONIX) IV  40 mg Intravenous Q24H  . sodium chloride  3 mL Intravenous Q12H   Continuous Infusions: . sodium chloride 150 mL/hr at 12/28/14 1702  . sodium chloride 100 mL/hr at 12/29/14 1345    Active Problems:   HTN (hypertension)   Acute pancreatitis   SOB (shortness of breath)   AKI (acute kidney injury)   Hyponatremia    Time spent: 30 minutes    Barton Dubois  [facility] Pager [phone]. If 7PM-7AM, please contact night-coverage at [email or web address], password [facility] 12/29/2014, 2:01 PM  LOS: 5 days

## 2014-12-30 MED ORDER — FUROSEMIDE 10 MG/ML IJ SOLN
40.0000 mg | Freq: Once | INTRAMUSCULAR | Status: AC
  Administered 2014-12-30: 40 mg via INTRAVENOUS

## 2014-12-30 MED ORDER — FUROSEMIDE 10 MG/ML IJ SOLN
20.0000 mg | Freq: Once | INTRAMUSCULAR | Status: AC
  Administered 2014-12-30: 20 mg via INTRAVENOUS
  Filled 2014-12-30: qty 2

## 2014-12-30 MED ORDER — OXYCODONE HCL 5 MG PO TABS
5.0000 mg | ORAL_TABLET | ORAL | Status: DC | PRN
  Administered 2014-12-30 – 2015-01-02 (×6): 10 mg via ORAL
  Administered 2015-01-03 – 2015-01-04 (×4): 5 mg via ORAL
  Administered 2015-01-04 – 2015-01-06 (×7): 10 mg via ORAL
  Administered 2015-01-06 – 2015-01-07 (×2): 5 mg via ORAL
  Filled 2014-12-30: qty 1
  Filled 2014-12-30: qty 2
  Filled 2014-12-30 (×2): qty 1
  Filled 2014-12-30 (×4): qty 2
  Filled 2014-12-30 (×2): qty 1
  Filled 2014-12-30 (×4): qty 2
  Filled 2014-12-30: qty 1
  Filled 2014-12-30 (×4): qty 2

## 2014-12-30 MED ORDER — LEVALBUTEROL HCL 0.63 MG/3ML IN NEBU
0.6300 mg | INHALATION_SOLUTION | Freq: Three times a day (TID) | RESPIRATORY_TRACT | Status: DC | PRN
  Administered 2014-12-31 – 2015-01-01 (×2): 0.63 mg via RESPIRATORY_TRACT
  Filled 2014-12-30 (×3): qty 3

## 2014-12-30 MED ORDER — HYDRALAZINE HCL 20 MG/ML IJ SOLN: 10.0000 mg | Freq: Three times a day (TID) | INTRAMUSCULAR | Status: DC | PRN

## 2014-12-30 MED ORDER — FUROSEMIDE 10 MG/ML IJ SOLN
INTRAMUSCULAR | Status: AC
  Filled 2014-12-30: qty 4

## 2014-12-30 NOTE — Progress Notes (Signed)
Pt feeling short of breath.  pt with  non pitting edema in bilateral upper and lower extremities, lung with fine crackles in bases. Oxygen saturation 100% on 3L. Numidia, NP notified and will come see pt. Will continue to monitor.

## 2014-12-30 NOTE — Progress Notes (Signed)
Subjective: No acute events.  Pain is rated as a 6/10 and she is still using pain medications every 4 hours.  Objective: Vital signs in last 24 hours: Temp:  [97.7 F (36.5 C)-98 F (36.7 C)] 97.9 F (36.6 C) (01/19 1302) Pulse Rate:  [102-131] 129 (01/19 1302) Resp:  [20-22] 22 (01/19 1302) BP: (145-191)/(65-82) 184/68 mmHg (01/19 1302) SpO2:  [96 %-100 %] 96 % (01/19 1302) Last BM Date: 12/29/14  Intake/Output from previous day: 01/18 0701 - 01/19 0700 In: 1540.8 [I.V.:1540.8] Out: 1100 [Urine:1100] Intake/Output this shift: Total I/O In: 1083 [P.O.:1080; I.V.:3] Out: 2350 [Urine:2350]  General appearance: alert and no distress GI: mild/moderate abdominal distension, epigastric tenderness  Lab Results:  Recent Labs  12/28/14 0500  WBC 27.3*  HGB 11.0*  HCT 34.3*  PLT 474*   BMET  Recent Labs  12/28/14 0500 12/28/14 0852 12/29/14 0417  NA 137 131* 141  K 3.7 3.7 4.1  CL 105 105 109  CO2 23 22 19   GLUCOSE 173* 432* 108*  BUN 31* 26* 22  CREATININE 1.21* 1.11* 0.82  CALCIUM 7.9* 7.1* 8.6   LFT  Recent Labs  12/28/14 0500  PROT 6.0  ALBUMIN 2.8*  AST 19  ALT 14  ALKPHOS 76  BILITOT 0.7   PT/INR No results for input(s): LABPROT, INR in the last 72 hours. Hepatitis Panel No results for input(s): HEPBSAG, HCVAB, HEPAIGM, HEPBIGM in the last 72 hours. C-Diff No results for input(s): CDIFFTOX in the last 72 hours. Fecal Lactopherrin No results for input(s): FECLLACTOFRN in the last 72 hours.  Studies/Results: No results found.  Medications:  Scheduled: . antiseptic oral rinse  7 mL Mouth Rinse q12n4p  . chlorhexidine  15 mL Mouth Rinse BID  . latanoprost  1 drop Both Eyes QHS  . metoprolol  5 mg Intravenous 3 times per day  . pantoprazole (PROTONIX) IV  40 mg Intravenous Q24H  . sodium chloride  3 mL Intravenous Q12H   Continuous:   Assessment/Plan: 1) Severe acute pancreatitis. 2) HTN. 3) Hypoxia.   The patient is making progress  slowly.  She has a severe acute pancreatitis.  Plan: 1) Continue with pain medications. 2) Advance diet as tolerated.  Okay with clear liquids at this time. 3) Continue supportive care.  LOS: 6 days   Patricia [name] D 12/30/2014, 6:06 PM

## 2014-12-30 NOTE — Progress Notes (Signed)
[facility] PROGRESS NOTE  CIGI BEGA WPY:[id number] DOB: 1951/06/23 DOA: 12/24/2014 PCP: Tommy Medal, MD  Assessment/Plan: 1-acute pancreatitis with SIRS: unclear etiology; presumed to be associated with biliary source according to GI.  -IVF's discontinue with SOB -continue PRN antiemetics and analgesics -lipase is WNL now; will advance diet to CLD -will continue PPI -per GI no antibiotics at this point; -potential MRCP/-EUS in 2 months -will follow GI rec's  2-HTN: continue metoprolol IV; dose adjusted to help with sinus tachcycardia  -BP rising -will add hydralazine PRN  3-hypoxia: intensify and associated with wheezing.  -could be secondary to flash pulmonary edema vs atelectasis vs associated with pain meds -continue O2 supplementation as needed -CXR demonstrated atelectasis; will continue flutter valve -lasix given and will add PRN xopenex  4-glaucoma: continue xalatan  5-GERD: GI prophylaxis: will continue use PPI  6-hyperglycemia: significantly improved after changing type of IVF -CBG's in the 120-130 now; will monitor  7-acute kidney injury: due to 3rd spacing and decrease perfusion  -resolved with IVF's  8-hyponatremia: -resolved after changing type of IVF  Code Status: DNR Family Communication: no family at bedside Disposition Plan: home when medically stable    Consultants:  GI (Dr. Benson Norway)  Procedures:  See below for x-ray reports   Antibiotics:  cipro and flagyl 1/13>>1/14  HPI/Subjective: Afebrile, feeling overall better this morning. Patient with episode of hypoxia and SOB overnight (good response to lasix); she is having 1 plus LE edema and some wheezing.  Objective: Filed Vitals:   12/30/14 1302  BP: 184/68  Pulse: 129  Temp: 97.9 F (36.6 C)  Resp: 22    Intake/Output Summary (Last 24 hours) at 12/30/14 1454 Last data filed at 12/30/14 1438  Gross per 24 hour  Intake 2263.83 ml  Output   2550 ml  Net -286.17 ml    Filed Weights   12/25/14 0125  Weight: 63.866 kg (140 lb 12.8 oz)    Exam:   General:  Afebrile, no nausea or vomiting. Patient with episode of SOB and hypoxia overnight, responded well to use of lasix. abd pain much better today.   Cardiovascular: S1 and s2, no rubs or gallops  Respiratory: good air movement, no wheezing or crackles  Abdomen: mild guarding, no distension, less tenderness on palpation, positive BS   Musculoskeletal: no cyanosis or clubbing; positive 1 + edema bilaterally.  Data Reviewed: Basic Metabolic Panel:  Recent Labs Lab 12/25/14 0426 12/26/14 0404 12/28/14 0500 12/28/14 0852 12/29/14 0417  NA 134* 142 137 131* 141  K 4.3 4.3 3.7 3.7 4.1  CL 99 110 105 105 109  CO2 25 24 23 22 19   GLUCOSE 213* 132* 173* 432* 108*  BUN 16 15 31* 26* 22  CREATININE 0.67 0.69 1.21* 1.11* 0.82  CALCIUM 7.7* 6.7* 7.9* 7.1* 8.6  MG 1.8  --   --   --   --   PHOS 4.4  --   --   --   --    Liver Function Tests:  Recent Labs Lab 12/24/14 2122 12/25/14 0426 12/26/14 0404 12/28/14 0500  AST 28 21 18 19   ALT 26 20 15 14   ALKPHOS 79 65 58 76  BILITOT 0.8 0.5 0.7 0.7  PROT 6.9 6.0 5.4* 6.0  ALBUMIN 4.3 3.6 2.9* 2.8*    Recent Labs Lab 12/24/14 2122 12/25/14 0426 12/26/14 0404 12/28/14 0500  LIPASE 1170* 1218* 475* 32   CBC:  Recent Labs Lab 12/24/14 2122 12/25/14 0426 12/28/14 0500  WBC 32.8* 27.4*  27.3*  NEUTROABS 30.2*  --   --   HGB 14.3 13.2 11.0*  HCT 42.6 40.4 34.3*  MCV 89.3 90.0 91.2  PLT 558* 513* 474*    Recent Results (from the past 240 hour(s))  Urine culture     Status: None   Collection Time: 12/25/14 12:23 AM  Result Value Ref Range Status   Specimen Description URINE, CLEAN CATCH  Final   Special Requests NONE  Final   Colony Count NO GROWTH Performed at [facility]   Final   Culture NO GROWTH Performed at [facility]   Final   Report Status 12/26/2014 FINAL  Final  Culture, blood (routine x 2)      Status: None (Preliminary result)   Collection Time: 12/25/14  1:00 AM  Result Value Ref Range Status   Specimen Description BLOOD RIGHT FOREARM  Final   Special Requests BOTTLES DRAWN AEROBIC AND ANAEROBIC 5CC  Final   Culture   Final           BLOOD CULTURE RECEIVED NO GROWTH TO DATE CULTURE WILL BE HELD FOR 5 DAYS BEFORE ISSUING A FINAL NEGATIVE REPORT Performed at [facility]    Report Status PENDING  Incomplete  Culture, blood (routine x 2)     Status: None (Preliminary result)   Collection Time: 12/25/14  1:02 AM  Result Value Ref Range Status   Specimen Description BLOOD RIGHT HAND  Final   Special Requests BOTTLES DRAWN AEROBIC AND ANAEROBIC 5CC  Final   Culture   Final           BLOOD CULTURE RECEIVED NO GROWTH TO DATE CULTURE WILL BE HELD FOR 5 DAYS BEFORE ISSUING A FINAL NEGATIVE REPORT Performed at [facility]    Report Status PENDING  Incomplete     Studies: No results found.  Scheduled Meds: . antiseptic oral rinse  7 mL Mouth Rinse q12n4p  . chlorhexidine  15 mL Mouth Rinse BID  . latanoprost  1 drop Both Eyes QHS  . metoprolol  5 mg Intravenous 3 times per day  . pantoprazole (PROTONIX) IV  40 mg Intravenous Q24H  . sodium chloride  3 mL Intravenous Q12H   Continuous Infusions:    Active Problems:   HTN (hypertension)   Acute pancreatitis   SOB (shortness of breath)   AKI (acute kidney injury)   Hyponatremia    Time spent: 30 minutes    Barton Dubois  [facility] Pager [phone]. If 7PM-7AM, please contact night-coverage at [email or web address], password [facility] 12/30/2014, 2:54 PM  LOS: 6 days

## 2014-12-31 ENCOUNTER — Inpatient Hospital Stay (HOSPITAL_COMMUNITY): Payer: BC Managed Care – PPO

## 2014-12-31 LAB — CBC
HCT: 31.6 % — ABNORMAL LOW (ref 36.0–46.0)
Hemoglobin: 10.4 g/dL — ABNORMAL LOW (ref 12.0–15.0)
MCH: 28.5 pg (ref 26.0–34.0)
MCHC: 32.9 g/dL (ref 30.0–36.0)
MCV: 86.6 fL (ref 78.0–100.0)
Platelets: 580 10*3/uL — ABNORMAL HIGH (ref 150–400)
RBC: 3.65 MIL/uL — ABNORMAL LOW (ref 3.87–5.11)
RDW: 15.4 % (ref 11.5–15.5)
WBC: 31.4 10*3/uL — ABNORMAL HIGH (ref 4.0–10.5)

## 2014-12-31 LAB — CULTURE, BLOOD (ROUTINE X 2)
Culture: NO GROWTH
Culture: NO GROWTH

## 2014-12-31 LAB — LIPASE, BLOOD: Lipase: 19 U/L (ref 11–59)

## 2014-12-31 LAB — BASIC METABOLIC PANEL
Anion gap: 8 (ref 5–15)
BUN: 24 mg/dL — ABNORMAL HIGH (ref 6–23)
CO2: 27 mmol/L (ref 19–32)
Calcium: 8.4 mg/dL (ref 8.4–10.5)
Chloride: 101 mEq/L (ref 96–112)
Creatinine, Ser: 0.8 mg/dL (ref 0.50–1.10)
GFR calc Af Amer: 88 mL/min — ABNORMAL LOW (ref >90)
GFR calc non Af Amer: 76 mL/min — ABNORMAL LOW (ref >90)
Glucose, Bld: 141 mg/dL — ABNORMAL HIGH (ref 70–99)
Potassium: 3.2 mmol/L — ABNORMAL LOW (ref 3.5–5.1)
Sodium: 136 mmol/L (ref 135–145)

## 2014-12-31 MED ORDER — METOPROLOL TARTRATE 50 MG PO TABS
50.0000 mg | ORAL_TABLET | Freq: Two times a day (BID) | ORAL | Status: DC
  Administered 2014-12-31 – 2015-01-07 (×15): 50 mg via ORAL
  Filled 2014-12-31 (×16): qty 1

## 2014-12-31 MED ORDER — POTASSIUM CHLORIDE CRYS ER 20 MEQ PO TBCR
40.0000 meq | EXTENDED_RELEASE_TABLET | Freq: Once | ORAL | Status: AC
  Administered 2014-12-31: 40 meq via ORAL
  Filled 2014-12-31: qty 2

## 2014-12-31 NOTE — Progress Notes (Addendum)
Patient ID: Patricia [name], female   DOB: 02-08-1951, 64 y.o.   MRN: [id number]  [facility] PROGRESS NOTE  MARIADELCARMEN CORELLA FBP:[id number] DOB: [birth year] DOA: 12/24/2014 PCP: Tommy Medal, MD  Brief narrative:    Pt is 64 yo female who was admitted for evaluation of epigastric pain associated with N/V, poor oral intake. CT abd notable for developing pancreatitis, GI team consulted and [facility] asked to admit.   Assessment/Plan:   Acute pancreatitis  - unclear etiology, presumed to be associated with biliary source according to GI.  - lipase is WNL this AM and has remained stable over the past 72 hours  - continue PRN antiemetics and analgesics - per GI no antibiotics at this point HTN - continue metoprolol per home medical regimen  Acute hypoxic respiratory failure - no clear etiology, could be secondary to flash pulmonary edema vs atelectasis vs associated with pain meds - oxygen saturations now stable and at target range  - CXR demonstrated atelectasis; will continue flutter valve - BD's as needed  - repeat CXR in AM Glaucoma - continue xalatan Hypokalemia - supplement and repeat BMP in AM GERD - continue PPI Hyperglycemia - stable CBG's Acute kidney injury - pre renal in etiology - resolved with IVF Hyponatremia - also pre renal  - resolved with IVF Hodgkin lymphoma - outpatient follow up Leukocytosis  - secondary to the above, repeat CBC in AM  SCD's for DVT prophylaxis  Code Status: DNR Family Communication: no family at bedside Disposition Plan: home when medically stable   IV access:  Peripheral IV Procedures and diagnostic studies:     CXR 12/26/2014  No pulmonary edema. Bilateral small pleural effusion L>R with bilateral basilar atelectasis or infiltrate.     US Abdomen Complete  12/25/2014    Findings consistent with pancreatitis.   Ct Abdomen Pelvis W Contrast  12/24/2014  Significant acute pancreatitis, with apparent mild devascularization at  the body of the pancreas. No definite pseudocyst formation seen. Moderate amount of surrounding free fluid tracks anterior to Gerota's fascia bilaterally, extends about the liver, and extends inferiorly along the paracolic gutters into the pelvis. 2. Small left pleural effusion, with associated atelectasis. 3. 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 4. Tiny bilateral renal cysts suggested. 5. Mild calcification at the origins of the celiac trunk and bilateral renal arteries. 6. Calcification incidentally noted at the mitral valve.  Medical Consultants:  GI Other Consultants:  None  IAnti-Infectives:   None  Faye Ramsay, MD  [facility] Pager [phone]  If 7PM-7AM, please contact night-coverage [email or web address] Password [facility] 12/31/2014, 3:27 PM   LOS: 7 days   HPI/Subjective: No events overnight.   Objective: Filed Vitals:   12/30/14 1302 12/30/14 2125 12/31/14 0516 12/31/14 1359  BP: 184/68 162/62 150/70 133/59  Pulse: 129 135 129 113  Temp: 97.9 F (36.6 C) 98.2 F (36.8 C) 97.7 F (36.5 C) 98 F (36.7 C)  TempSrc: Oral Oral Oral Oral  Resp: 22 20 20 18   Height:      Weight:      SpO2: 96% 99% 92% 91%    Intake/Output Summary (Last 24 hours) at 12/31/14 1527 Last data filed at 12/31/14 1300  Gross per 24 hour  Intake   1080 ml  Output   1400 ml  Net   -320 ml    Exam:   General:  Pt is alert, follows commands appropriately, not in acute distress  Cardiovascular: Regular rhythm, tachycardic, no rubs, no  gallops  Respiratory: Clear to auscultation bilaterally, no wheezing, no crackles, no rhonchi  Abdomen: Soft, tender in upper abd quadrants, non distended, bowel sounds present, no guarding  Extremities: No edema, pulses DP and PT palpable bilaterally  Neuro: Grossly nonfocal  Data Reviewed: Basic Metabolic Panel:  Recent Labs Lab 12/25/14 0426 12/26/14 0404 12/28/14 0500 12/28/14 0852 12/29/14 0417  12/31/14 0459  NA 134* 142 137 131* 141 136  K 4.3 4.3 3.7 3.7 4.1 3.2*  CL 99 110 105 105 109 101  CO2 25 24 23 22 19 27   GLUCOSE 213* 132* 173* 432* 108* 141*  BUN 16 15 31* 26* 22 24*  CREATININE 0.67 0.69 1.21* 1.11* 0.82 0.80  CALCIUM 7.7* 6.7* 7.9* 7.1* 8.6 8.4  MG 1.8  --   --   --   --   --   PHOS 4.4  --   --   --   --   --    Liver Function Tests:  Recent Labs Lab 12/24/14 2122 12/25/14 0426 12/26/14 0404 12/28/14 0500  AST 28 21 18 19   ALT 26 20 15 14   ALKPHOS 79 65 58 76  BILITOT 0.8 0.5 0.7 0.7  PROT 6.9 6.0 5.4* 6.0  ALBUMIN 4.3 3.6 2.9* 2.8*    Recent Labs Lab 12/24/14 2122 12/25/14 0426 12/26/14 0404 12/28/14 0500 12/31/14 0459  LIPASE 1170* 1218* 475* 32 19   CBC:  Recent Labs Lab 12/24/14 2122 12/25/14 0426 12/28/14 0500 12/31/14 0459  WBC 32.8* 27.4* 27.3* 31.4*  NEUTROABS 30.2*  --   --   --   HGB 14.3 13.2 11.0* 10.4*  HCT 42.6 40.4 34.3* 31.6*  MCV 89.3 90.0 91.2 86.6  PLT 558* 513* 474* 580*    Recent Results (from the past 240 hour(s))  Urine culture     Status: None   Collection Time: 12/25/14 12:23 AM  Result Value Ref Range Status   Specimen Description URINE, CLEAN CATCH  Final   Special Requests NONE  Final   Colony Count NO GROWTH Performed at [facility]   Final   Culture NO GROWTH Performed at [facility]   Final   Report Status 12/26/2014 FINAL  Final  Culture, blood (routine x 2)     Status: None   Collection Time: 12/25/14  1:00 AM  Result Value Ref Range Status   Specimen Description BLOOD RIGHT FOREARM  Final   Special Requests BOTTLES DRAWN AEROBIC AND ANAEROBIC 5CC  Final   Culture   Final    NO GROWTH 5 DAYS Performed at [facility]    Report Status 12/31/2014 FINAL  Final  Culture, blood (routine x 2)     Status: None   Collection Time: 12/25/14  1:02 AM  Result Value Ref Range Status   Specimen Description BLOOD RIGHT HAND  Final   Special Requests BOTTLES DRAWN  AEROBIC AND ANAEROBIC 5CC  Final   Culture   Final    NO GROWTH 5 DAYS Performed at [facility]    Report Status 12/31/2014 FINAL  Final     Scheduled Meds: . antiseptic oral rinse  7 mL Mouth Rinse q12n4p  . chlorhexidine  15 mL Mouth Rinse BID  . latanoprost  1 drop Both Eyes QHS  . metoprolol tartrate  50 mg Oral BID  . pantoprazole (PROTONIX) IV  40 mg Intravenous Q24H  . sodium chloride  3 mL Intravenous Q12H   Continuous Infusions:

## 2014-12-31 NOTE — Progress Notes (Signed)
Subjective: Feeling better.  She notices that she has been able to space out her pain medications.  Objective: Vital signs in last 24 hours: Temp:  [97.7 F (36.5 C)-98.2 F (36.8 C)] 97.7 F (36.5 C) (01/20 0516) Pulse Rate:  [129-135] 129 (01/20 0516) Resp:  [20-22] 20 (01/20 0516) BP: (150-184)/(62-70) 150/70 mmHg (01/20 0516) SpO2:  [92 %-99 %] 92 % (01/20 0516) Last BM Date: 12/29/14  Intake/Output from previous day: 01/19 0701 - 01/20 0700 In: 1323 [P.O.:1320; I.V.:3] Out: 2350 [Urine:2350] Intake/Output this shift: Total I/O In: 240 [P.O.:240] Out: -   General appearance: alert and no distress GI: decreased epigastric tenderness Resp: decreased breath sounds in the lung bases Lab Results:  Recent Labs  12/31/14 0459  WBC 31.4*  HGB 10.4*  HCT 31.6*  PLT 580*   BMET  Recent Labs  12/29/14 0417 12/31/14 0459  NA 141 136  K 4.1 3.2*  CL 109 101  CO2 19 27  GLUCOSE 108* 141*  BUN 22 24*  CREATININE 0.82 0.80  CALCIUM 8.6 8.4   LFT No results for input(s): PROT, ALBUMIN, AST, ALT, ALKPHOS, BILITOT, BILIDIR, IBILI in the last 72 hours. PT/INR No results for input(s): LABPROT, INR in the last 72 hours. Hepatitis Panel No results for input(s): HEPBSAG, HCVAB, HEPAIGM, HEPBIGM in the last 72 hours. C-Diff No results for input(s): CDIFFTOX in the last 72 hours. Fecal Lactopherrin No results for input(s): FECLLACTOFRN in the last 72 hours.  Studies/Results: No results found.  Medications:  Scheduled: . antiseptic oral rinse  7 mL Mouth Rinse q12n4p  . chlorhexidine  15 mL Mouth Rinse BID  . latanoprost  1 drop Both Eyes QHS  . metoprolol tartrate  50 mg Oral BID  . pantoprazole (PROTONIX) IV  40 mg Intravenous Q24H  . sodium chloride  3 mL Intravenous Q12H   Continuous:   Assessment/Plan: 1) Acute severe pancreatitis. 2) Mild/Moderate fluid overload.   She continues to make progress.  No new recommendations.  Her WBC has increased, but no  documentation or reports of any fever.  No need for any antibiotics for further investigation at this time.  Plan: 1) Continue with supportive care.    LOS: 7 days   Tymira Horkey D 12/31/2014, 12:56 PM

## 2015-01-01 LAB — CBC
HCT: 29.4 % — ABNORMAL LOW (ref 36.0–46.0)
Hemoglobin: 10.3 g/dL — ABNORMAL LOW (ref 12.0–15.0)
MCH: 30.3 pg (ref 26.0–34.0)
MCHC: 35 g/dL (ref 30.0–36.0)
MCV: 86.5 fL (ref 78.0–100.0)
Platelets: 562 10*3/uL — ABNORMAL HIGH (ref 150–400)
RBC: 3.4 MIL/uL — ABNORMAL LOW (ref 3.87–5.11)
RDW: 15.3 % (ref 11.5–15.5)
WBC: 34.9 10*3/uL — ABNORMAL HIGH (ref 4.0–10.5)

## 2015-01-01 LAB — COMPREHENSIVE METABOLIC PANEL
ALT: 18 U/L (ref 0–35)
AST: 17 U/L (ref 0–37)
Albumin: 2.1 g/dL — ABNORMAL LOW (ref 3.5–5.2)
Alkaline Phosphatase: 182 U/L — ABNORMAL HIGH (ref 39–117)
Anion gap: 9 (ref 5–15)
BUN: 19 mg/dL (ref 6–23)
CO2: 24 mmol/L (ref 19–32)
Calcium: 7.9 mg/dL — ABNORMAL LOW (ref 8.4–10.5)
Chloride: 100 mEq/L (ref 96–112)
Creatinine, Ser: 0.74 mg/dL (ref 0.50–1.10)
GFR calc Af Amer: >90 mL/min (ref >90)
GFR calc non Af Amer: 88 mL/min — ABNORMAL LOW (ref >90)
Glucose, Bld: 121 mg/dL — ABNORMAL HIGH (ref 70–99)
Potassium: 3.5 mmol/L (ref 3.5–5.1)
Sodium: 133 mmol/L — ABNORMAL LOW (ref 135–145)
Total Bilirubin: 0.7 mg/dL (ref 0.3–1.2)
Total Protein: 5.1 g/dL — ABNORMAL LOW (ref 6.0–8.3)

## 2015-01-01 MED ORDER — FUROSEMIDE 10 MG/ML IJ SOLN
40.0000 mg | Freq: Once | INTRAMUSCULAR | Status: AC
  Administered 2015-01-01: 40 mg via INTRAVENOUS
  Filled 2015-01-01: qty 4

## 2015-01-01 MED ORDER — FUROSEMIDE 10 MG/ML IJ SOLN
40.0000 mg | Freq: Once | INTRAMUSCULAR | Status: AC
  Administered 2015-01-02: 40 mg via INTRAVENOUS
  Filled 2015-01-01: qty 4

## 2015-01-01 MED ORDER — LEVALBUTEROL HCL 0.63 MG/3ML IN NEBU: 0.6300 mg | INHALATION_SOLUTION | RESPIRATORY_TRACT | Status: DC | PRN

## 2015-01-01 MED ORDER — SENNOSIDES-DOCUSATE SODIUM 8.6-50 MG PO TABS
1.0000 | ORAL_TABLET | Freq: Two times a day (BID) | ORAL | Status: DC
  Administered 2015-01-01 – 2015-01-02 (×3): 1 via ORAL
  Filled 2015-01-01 (×4): qty 1

## 2015-01-01 MED ORDER — LEVALBUTEROL HCL 0.63 MG/3ML IN NEBU
0.6300 mg | INHALATION_SOLUTION | Freq: Three times a day (TID) | RESPIRATORY_TRACT | Status: DC
  Administered 2015-01-01 – 2015-01-03 (×7): 0.63 mg via RESPIRATORY_TRACT
  Filled 2015-01-01 (×7): qty 3

## 2015-01-01 NOTE — Progress Notes (Signed)
Patient ID: Patricia [name], female   DOB: 09-17-1951, 64 y.o.   MRN: [id number]  [facility] PROGRESS NOTE  BRIDGET Violet DDU:[id number] DOB: 1951/05/05 DOA: 12/24/2014 PCP: Tommy Medal, MD   Brief narrative:    Pt is 64 yo female who was admitted for evaluation of epigastric pain associated with N/V, poor oral intake. CT abd notable for developing pancreatitis, GI team consulted and [facility] asked to admit.   Assessment/Plan:   Acute pancreatitis  - unclear etiology, presumed to be associated with biliary source according to GI.  - lipase is WNL this AM and has remained stable over the past 72 hours  - continue PRN antiemetics and analgesics - per GI no antibiotics at this point HTN - continue metoprolol per home medical regimen  Acute hypoxic respiratory failure - per CXR appears to be from pulmonary vascular congestion  - oxygen saturations now stable and at target range  - place on Lasix 40 mg IV for now and monitor clinical response  - BD's as needed  - monitor daily weights, strict I's and O's  Glaucoma - continue xalatan Hypokalemia - supplemented and WNL this AM GERD - continue PPI Hyperglycemia - stable CBG's Acute kidney injury - pre renal in etiology - resolved with IVF Hyponatremia - also pre renal  - remains stable ~ 133  Hodgkin lymphoma - outpatient follow up Leukocytosis  - secondary to the above, repeat CBC in AM  SCD's for DVT prophylaxis  Code Status: DNR Family Communication: no family at bedside Disposition Plan: home when medically stable   IV access:  Peripheral IV Procedures and diagnostic studies:    CXR 12/26/2014 No pulmonary edema. Bilateral small pleural effusion L>R with bilateral basilar atelectasis or infiltrate.   US Abdomen Complete 12/25/2014 Findings consistent with pancreatitis.   Ct Abdomen Pelvis W Contrast 12/24/2014 Significant acute pancreatitis, with apparent mild devascularization at  the body of the pancreas. No definite pseudocyst formation seen. Moderate amount of surrounding free fluid tracks anterior to Gerota's fascia bilaterally, extends about the liver, and extends inferiorly along the paracolic gutters into the pelvis. 2. Small left pleural effusion, with associated atelectasis. 3. 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 4. Tiny bilateral renal cysts suggested. 5. Mild calcification at the origins of the celiac trunk and bilateral renal arteries. 6. Calcification incidentally noted at the mitral valve.  Medical Consultants:  GI Other Consultants:  None  IAnti-Infectives:   None    Faye Ramsay, MD  [facility] Pager [phone]  If 7PM-7AM, please contact night-coverage [email or web address] Password [email or web address] 01/01/2015, 10:07 AM   LOS: 8 days   HPI/Subjective: No events overnight.   Objective: Filed Vitals:   12/31/14 2233 01/01/15 0004 01/01/15 0502 01/01/15 0814  BP:  150/61 154/64   Pulse:  114 105   Temp:   98.5 F (36.9 C)   TempSrc:   Oral   Resp:   20   Height:      Weight:      SpO2: 94%  100% 96%    Intake/Output Summary (Last 24 hours) at 01/01/15 1007 Last data filed at 12/31/14 2215  Gross per 24 hour  Intake    720 ml  Output    600 ml  Net    120 ml    Exam:   General:  Pt is alert, follows commands appropriately, not in acute distress  Cardiovascular: Regular rate and rhythm, S1/S2, no murmurs, no rubs, no gallops  Respiratory: Clear to  auscultation bilaterally, no wheezing, bibasilar crackles   Abdomen: Soft, non tender, non distended, bowel sounds present, no guarding  Extremities: +2 bilateral LE pitting edema, pulses DP and PT palpable bilaterally  Neuro: Grossly nonfocal  Data Reviewed: Basic Metabolic Panel:  Recent Labs Lab 12/28/14 0500 12/28/14 0852 12/29/14 0417 12/31/14 0459 01/01/15 0443  NA 137 131* 141 136 133*  K 3.7 3.7 4.1 3.2* 3.5  CL 105 105 109 101  100  CO2 23 22 19 27 24   GLUCOSE 173* 432* 108* 141* 121*  BUN 31* 26* 22 24* 19  CREATININE 1.21* 1.11* 0.82 0.80 0.74  CALCIUM 7.9* 7.1* 8.6 8.4 7.9*   Liver Function Tests:  Recent Labs Lab 12/26/14 0404 12/28/14 0500 01/01/15 0443  AST 18 19 17   ALT 15 14 18   ALKPHOS 58 76 182*  BILITOT 0.7 0.7 0.7  PROT 5.4* 6.0 5.1*  ALBUMIN 2.9* 2.8* 2.1*    Recent Labs Lab 12/26/14 0404 12/28/14 0500 12/31/14 0459  LIPASE 475* 32 19   CBC:  Recent Labs Lab 12/28/14 0500 12/31/14 0459 01/01/15 0443  WBC 27.3* 31.4* 34.9*  HGB 11.0* 10.4* 10.3*  HCT 34.3* 31.6* 29.4*  MCV 91.2 86.6 86.5  PLT 474* 580* 562*   Recent Results (from the past 240 hour(s))  Urine culture     Status: None   Collection Time: 12/25/14 12:23 AM  Result Value Ref Range Status   Specimen Description URINE, CLEAN CATCH  Final   Special Requests NONE  Final   Colony Count NO GROWTH Performed at [facility]   Final   Culture NO GROWTH Performed at [facility]   Final   Report Status 12/26/2014 FINAL  Final  Culture, blood (routine x 2)     Status: None   Collection Time: 12/25/14  1:00 AM  Result Value Ref Range Status   Specimen Description BLOOD RIGHT FOREARM  Final   Special Requests BOTTLES DRAWN AEROBIC AND ANAEROBIC 5CC  Final   Culture   Final    NO GROWTH 5 DAYS Performed at [facility]    Report Status 12/31/2014 FINAL  Final  Culture, blood (routine x 2)     Status: None   Collection Time: 12/25/14  1:02 AM  Result Value Ref Range Status   Specimen Description BLOOD RIGHT HAND  Final   Special Requests BOTTLES DRAWN AEROBIC AND ANAEROBIC 5CC  Final   Culture   Final    NO GROWTH 5 DAYS Performed at [facility]    Report Status 12/31/2014 FINAL  Final     Scheduled Meds: . antiseptic oral rinse  7 mL Mouth Rinse q12n4p  . chlorhexidine  15 mL Mouth Rinse BID  . latanoprost  1 drop Both Eyes QHS  . levalbuterol  0.63 mg Nebulization  TID  . metoprolol tartrate  50 mg Oral BID  . pantoprazole (PROTONIX) IV  40 mg Intravenous Q24H  . sodium chloride  3 mL Intravenous Q12H   Continuous Infusions:

## 2015-01-01 NOTE — Progress Notes (Signed)
Subjective: Feeling better.  Still no appetite.    Objective: Vital signs in last 24 hours: Temp:  [97.5 F (36.4 C)-99.1 F (37.3 C)] 97.5 F (36.4 C) (01/21 1351) Pulse Rate:  [105-116] 108 (01/21 1351) Resp:  [20] 20 (01/21 1351) BP: (147-157)/(61-69) 147/62 mmHg (01/21 1351) SpO2:  [94 %-100 %] 98 % (01/21 1351) Weight:  [70.081 kg (154 lb 8 oz)] 70.081 kg (154 lb 8 oz) (01/21 1550) Last BM Date: 01/01/15  Intake/Output from previous day: 01/20 0701 - 01/21 0700 In: 960 [P.O.:960] Out: 1100 [Urine:1100] Intake/Output this shift: Total I/O In: 600 [P.O.:600] Out: 800 [Urine:800]  General appearance: alert and no distress GI: epigastric tenderness  Lab Results:  Recent Labs  12/31/14 0459 01/01/15 0443  WBC 31.4* 34.9*  HGB 10.4* 10.3*  HCT 31.6* 29.4*  PLT 580* 562*   BMET  Recent Labs  12/31/14 0459 01/01/15 0443  NA 136 133*  K 3.2* 3.5  CL 101 100  CO2 27 24  GLUCOSE 141* 121*  BUN 24* 19  CREATININE 0.80 0.74  CALCIUM 8.4 7.9*   LFT  Recent Labs  01/01/15 0443  PROT 5.1*  ALBUMIN 2.1*  AST 17  ALT 18  ALKPHOS 182*  BILITOT 0.7   PT/INR No results for input(s): LABPROT, INR in the last 72 hours. Hepatitis Panel No results for input(s): HEPBSAG, HCVAB, HEPAIGM, HEPBIGM in the last 72 hours. C-Diff No results for input(s): CDIFFTOX in the last 72 hours. Fecal Lactopherrin No results for input(s): FECLLACTOFRN in the last 72 hours.  Studies/Results: Dg Chest 2 View  12/31/2014   CLINICAL DATA:  64 year old with dyspnea. History of hypertension. History of breast cancer and left mastectomy. History of Hodgkin's disease.  EXAM: CHEST  2 VIEW  COMPARISON:  12/26/2014  FINDINGS: Patient had a left mastectomy. There are bilateral pleural effusions, left side greater than right. No significant pulmonary edema. The heart is obscured by the pleural fluid. Trachea is midline. Surgical clips in the abdomen. Focal density in the left abdomen is  probably related to bowel.  IMPRESSION: Bilateral pleural effusions, left side greater than right. Left pleural effusion is moderate in size.   Electronically Signed   By: Markus Daft M.D.   On: 12/31/2014 17:07    Medications:  Scheduled: . antiseptic oral rinse  7 mL Mouth Rinse q12n4p  . chlorhexidine  15 mL Mouth Rinse BID  . [START ON 01/02/2015] furosemide  40 mg Intravenous Once  . latanoprost  1 drop Both Eyes QHS  . levalbuterol  0.63 mg Nebulization TID  . metoprolol tartrate  50 mg Oral BID  . pantoprazole (PROTONIX) IV  40 mg Intravenous Q24H  . senna-docusate  1 tablet Oral BID  . sodium chloride  3 mL Intravenous Q12H   Continuous:   Assessment/Plan: 1) Severe acute pancreatitis. 2) Fluid overload.   She continues to improve.  She cannot recall the timing of her pain medications, but the frequency of use has declined.  I asked her to keep a track of how much she uses her pain medications.  Plan: 1) Continue with pain medications. 2) Nutrition can be considered with a nasal feeding tube if she does not start to increase her PO intake soon.   LOS: 8 days   Patricia [name] D 01/01/2015, 5:55 PM

## 2015-01-01 NOTE — Plan of Care (Signed)
Problem: Discharge Progression Outcomes Goal: Barriers To Progression Addressed/Resolved Outcome: Completed/Met Date Met:  01/01/15 Edema being resolved

## 2015-01-02 ENCOUNTER — Inpatient Hospital Stay (HOSPITAL_COMMUNITY): Payer: BC Managed Care – PPO

## 2015-01-02 LAB — BASIC METABOLIC PANEL
Anion gap: 9 (ref 5–15)
BUN: 16 mg/dL (ref 6–23)
CO2: 31 mmol/L (ref 19–32)
Calcium: 7.8 mg/dL — ABNORMAL LOW (ref 8.4–10.5)
Chloride: 96 mEq/L (ref 96–112)
Creatinine, Ser: 0.73 mg/dL (ref 0.50–1.10)
GFR calc Af Amer: >90 mL/min (ref >90)
GFR calc non Af Amer: 88 mL/min — ABNORMAL LOW (ref >90)
Glucose, Bld: 120 mg/dL — ABNORMAL HIGH (ref 70–99)
Potassium: 3.1 mmol/L — ABNORMAL LOW (ref 3.5–5.1)
Sodium: 136 mmol/L (ref 135–145)

## 2015-01-02 LAB — CBC
HCT: 28.6 % — ABNORMAL LOW (ref 36.0–46.0)
Hemoglobin: 9.7 g/dL — ABNORMAL LOW (ref 12.0–15.0)
MCH: 29.2 pg (ref 26.0–34.0)
MCHC: 33.9 g/dL (ref 30.0–36.0)
MCV: 86.1 fL (ref 78.0–100.0)
Platelets: 638 10*3/uL — ABNORMAL HIGH (ref 150–400)
RBC: 3.32 MIL/uL — ABNORMAL LOW (ref 3.87–5.11)
RDW: 15.3 % (ref 11.5–15.5)
WBC: 32.2 10*3/uL — ABNORMAL HIGH (ref 4.0–10.5)

## 2015-01-02 LAB — BRAIN NATRIURETIC PEPTIDE: B Natriuretic Peptide: 340.3 pg/mL — ABNORMAL HIGH (ref 0.0–100.0)

## 2015-01-02 MED ORDER — POTASSIUM CHLORIDE CRYS ER 20 MEQ PO TBCR
40.0000 meq | EXTENDED_RELEASE_TABLET | Freq: Once | ORAL | Status: AC
  Administered 2015-01-02: 40 meq via ORAL
  Filled 2015-01-02: qty 2

## 2015-01-02 MED ORDER — FUROSEMIDE 10 MG/ML IJ SOLN
40.0000 mg | Freq: Every day | INTRAMUSCULAR | Status: DC
  Administered 2015-01-03 – 2015-01-04 (×2): 40 mg via INTRAVENOUS
  Filled 2015-01-02 (×2): qty 4

## 2015-01-02 NOTE — Progress Notes (Signed)
Patient ID: Patricia [name], female   DOB: May 09, 1951, 64 y.o.   MRN: [id number]  [facility] PROGRESS NOTE  YITZEL SHASTEEN JJH:[id number] DOB: 03/18/1951 DOA: 12/24/2014 PCP: Tommy Medal, MD Brief narrative:    Pt is 64 yo female who was admitted for evaluation of epigastric pain associated with N/V, poor oral intake. CT abd notable for developing pancreatitis, GI team consulted and [facility] asked to admit.   Assessment/Plan:   Acute pancreatitis  - ? biliary source according to GI.  - lipase has been WNL for the past 72 hours but pt still with rather poor oral intake  - continue PRN antiemetics and analgesics - per GI no antibiotics at this point - appreciate GI assistance  HTN - reasonable inpatient control  - continue metoprolol per home medical regimen  Acute hypoxic respiratory failure - clinically consistent with pulmonary vascular congestion and confirmed with CXR and mildly elevated BNP - oxygen saturations now stable and at target range  - pt given one dose of Lasix 1/21 (40 mg IV x 1 dose), will continue same regimen for now as pt is still over her baseline weight of 135 lbs - weight this AM is 153 lbs  - continue to monitor daily weights, strict I's and O's  Glaucoma - continue xalatan Hypokalemia - from Lasix, will supplement and repeat BMP in AM GERD - continue PPI Hyperglycemia - stable CBG's Acute kidney injury - pre renal in etiology, IVF provided and Cr has been WNL  Hyponatremia - also pre renal  - remains stable and is WNL this AM Hodgkin lymphoma - outpatient follow up Leukocytosis  - secondary to the above, repeat CBC in AM  SCD's for DVT prophylaxis  Code Status: DNR Family Communication: no family at bedside Disposition Plan: home when medically stable   IV access:  Peripheral IV Procedures and diagnostic studies:    CXR 12/26/2014 No pulmonary edema. Bilateral small pleural effusion L>R with bilateral basilar  atelectasis or infiltrate.   US Abdomen Complete 12/25/2014 Findings consistent with pancreatitis.   Ct Abdomen Pelvis W Contrast 12/24/2014 Significant acute pancreatitis, with apparent mild devascularization at the body of the pancreas. No definite pseudocyst formation seen. Moderate amount of surrounding free fluid tracks anterior to Gerota's fascia bilaterally, extends about the liver, and extends inferiorly along the paracolic gutters into the pelvis. 2. Small left pleural effusion, with associated atelectasis. 3. 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 4. Tiny bilateral renal cysts suggested. 5. Mild calcification at the origins of the celiac trunk and bilateral renal arteries. 6. Calcification incidentally noted at the mitral valve.  Medical Consultants:  GI Other Consultants:  None  IAnti-Infectives:   None   Faye Ramsay, MD  [facility] Pager [phone]  If 7PM-7AM, please contact night-coverage [email or web address] Password [facility] 01/02/2015, 7:35 AM   LOS: 9 days   HPI/Subjective: No events overnight.   Objective: Filed Vitals:   01/01/15 2124 01/01/15 2220 01/02/15 0100 01/02/15 0543  BP: 143/57  151/64 142/61  Pulse: 116  103 92  Temp: 99.9 F (37.7 C) 98 F (36.7 C)  97.8 F (36.6 C)  TempSrc: Oral   Oral  Resp: 20   20  Height:      Weight:    69.536 kg (153 lb 4.8 oz)  SpO2: 99%   100%    Intake/Output Summary (Last 24 hours) at 01/02/15 0735 Last data filed at 01/01/15 2200  Gross per 24 hour  Intake  840 ml  Output   1450 ml  Net   -610 ml    Exam:   General:  Pt is alert, follows commands appropriately, not in acute distress  Cardiovascular: Regular rate and rhythm, S1/S2, no murmurs, no rubs, no gallops  Respiratory: Bibasilar crackles, no wheezing, no use of accessory muscles   Abdomen: Soft, non tender, non distended, bowel sounds present, no guarding  Extremities: + 2 bilateral LE  pitting edema, pulses DP and PT palpable bilaterally  Neuro: Grossly nonfocal  Data Reviewed: Basic Metabolic Panel:  Recent Labs Lab 12/28/14 0852 12/29/14 0417 12/31/14 0459 01/01/15 0443 01/02/15 0518  NA 131* 141 136 133* 136  K 3.7 4.1 3.2* 3.5 3.1*  CL 105 109 101 100 96  CO2 22 19 27 24 31   GLUCOSE 432* 108* 141* 121* 120*  BUN 26* 22 24* 19 16  CREATININE 1.11* 0.82 0.80 0.74 0.73  CALCIUM 7.1* 8.6 8.4 7.9* 7.8*   Liver Function Tests:  Recent Labs Lab 12/28/14 0500 01/01/15 0443  AST 19 17  ALT 14 18  ALKPHOS 76 182*  BILITOT 0.7 0.7  PROT 6.0 5.1*  ALBUMIN 2.8* 2.1*    Recent Labs Lab 12/28/14 0500 12/31/14 0459  LIPASE 32 19   CBC:  Recent Labs Lab 12/28/14 0500 12/31/14 0459 01/01/15 0443 01/02/15 0518  WBC 27.3* 31.4* 34.9* 32.2*  HGB 11.0* 10.4* 10.3* 9.7*  HCT 34.3* 31.6* 29.4* 28.6*  MCV 91.2 86.6 86.5 86.1  PLT 474* 580* 562* 638*    Recent Results (from the past 240 hour(s))  Urine culture     Status: None   Collection Time: 12/25/14 12:23 AM  Result Value Ref Range Status   Specimen Description URINE, CLEAN CATCH  Final   Special Requests NONE  Final   Colony Count NO GROWTH Performed at [facility]   Final   Culture NO GROWTH Performed at [facility]   Final   Report Status 12/26/2014 FINAL  Final  Culture, blood (routine x 2)     Status: None   Collection Time: 12/25/14  1:00 AM  Result Value Ref Range Status   Specimen Description BLOOD RIGHT FOREARM  Final   Special Requests BOTTLES DRAWN AEROBIC AND ANAEROBIC 5CC  Final   Culture   Final    NO GROWTH 5 DAYS Performed at [facility]    Report Status 12/31/2014 FINAL  Final  Culture, blood (routine x 2)     Status: None   Collection Time: 12/25/14  1:02 AM  Result Value Ref Range Status   Specimen Description BLOOD RIGHT HAND  Final   Special Requests BOTTLES DRAWN AEROBIC AND ANAEROBIC 5CC  Final   Culture   Final    NO GROWTH 5  DAYS Performed at [facility]    Report Status 12/31/2014 FINAL  Final     Scheduled Meds: . antiseptic oral rinse  7 mL Mouth Rinse q12n4p  . chlorhexidine  15 mL Mouth Rinse BID  . furosemide  40 mg Intravenous Once  . latanoprost  1 drop Both Eyes QHS  . levalbuterol  0.63 mg Nebulization TID  . metoprolol tartrate  50 mg Oral BID  . pantoprazole (PROTONIX) IV  40 mg Intravenous Q24H  . senna-docusate  1 tablet Oral BID  . sodium chloride  3 mL Intravenous Q12H   Continuous Infusions:

## 2015-01-02 NOTE — Progress Notes (Signed)
Subjective: Feeling well.  Objective: Vital signs in last 24 hours: Temp:  [97.5 F (36.4 C)-99.9 F (37.7 C)] 97.8 F (36.6 C) (01/22 0543) Pulse Rate:  [92-116] 92 (01/22 0543) Resp:  [20] 20 (01/22 0543) BP: (142-157)/(57-64) 142/61 mmHg (01/22 0543) SpO2:  [96 %-100 %] 96 % (01/22 0739) Weight:  [69.536 kg (153 lb 4.8 oz)-70.081 kg (154 lb 8 oz)] 69.536 kg (153 lb 4.8 oz) (01/22 0543) Last BM Date: 01/01/15  Intake/Output from previous day: 01/21 0701 - 01/22 0700 In: 960 [P.O.:960] Out: 1700 [Urine:1700] Intake/Output this shift: Total I/O In: 240 [P.O.:240] Out: 500 [Urine:500]  General appearance: alert and no distress GI: soft, non-tender; bowel sounds normal; no masses,  no organomegaly  Lab Results:  Recent Labs  12/31/14 0459 01/01/15 0443 01/02/15 0518  WBC 31.4* 34.9* 32.2*  HGB 10.4* 10.3* 9.7*  HCT 31.6* 29.4* 28.6*  PLT 580* 562* 638*   BMET  Recent Labs  12/31/14 0459 01/01/15 0443 01/02/15 0518  NA 136 133* 136  K 3.2* 3.5 3.1*  CL 101 100 96  CO2 27 24 31   GLUCOSE 141* 121* 120*  BUN 24* 19 16  CREATININE 0.80 0.74 0.73  CALCIUM 8.4 7.9* 7.8*   LFT  Recent Labs  01/01/15 0443  PROT 5.1*  ALBUMIN 2.1*  AST 17  ALT 18  ALKPHOS 182*  BILITOT 0.7   PT/INR No results for input(s): LABPROT, INR in the last 72 hours. Hepatitis Panel No results for input(s): HEPBSAG, HCVAB, HEPAIGM, HEPBIGM in the last 72 hours. C-Diff No results for input(s): CDIFFTOX in the last 72 hours. Fecal Lactopherrin No results for input(s): FECLLACTOFRN in the last 72 hours.  Studies/Results: Dg Chest 2 View  01/02/2015   CLINICAL DATA:  Shortness of breath. History of lymphoma and breast cancer.  EXAM: CHEST  2 VIEW  COMPARISON:  Chest radiograph 12/31/2014  FINDINGS: Stable cardiac and mediastinal contours. Unchanged moderate bilateral pleural effusions with underlying consolidative pulmonary opacities. No definite pneumothorax. Upper abdominal  surgical clips. Mid thoracic spine degenerative changes.  IMPRESSION: Stable bilateral pleural effusions and underlying consolidative opacities.   Electronically Signed   By: Lovey Newcomer M.D.   On: 01/02/2015 08:20   Dg Chest 2 View  12/31/2014   CLINICAL DATA:  64 year old with dyspnea. History of hypertension. History of breast cancer and left mastectomy. History of Hodgkin's disease.  EXAM: CHEST  2 VIEW  COMPARISON:  12/26/2014  FINDINGS: Patient had a left mastectomy. There are bilateral pleural effusions, left side greater than right. No significant pulmonary edema. The heart is obscured by the pleural fluid. Trachea is midline. Surgical clips in the abdomen. Focal density in the left abdomen is probably related to bowel.  IMPRESSION: Bilateral pleural effusions, left side greater than right. Left pleural effusion is moderate in size.   Electronically Signed   By: Markus Daft M.D.   On: 12/31/2014 17:07    Medications:  Scheduled: . antiseptic oral rinse  7 mL Mouth Rinse q12n4p  . chlorhexidine  15 mL Mouth Rinse BID  . [START ON 01/03/2015] furosemide  40 mg Intravenous Daily  . latanoprost  1 drop Both Eyes QHS  . levalbuterol  0.63 mg Nebulization TID  . metoprolol tartrate  50 mg Oral BID  . pantoprazole (PROTONIX) IV  40 mg Intravenous Q24H  . senna-docusate  1 tablet Oral BID  . sodium chloride  3 mL Intravenous Q12H   Continuous:   Assessment/Plan: 1) Severe acute pancreatitis. 2)  Leukocystosis secondary to pancreatitis. 3) Fluid overload.   Today she looks the best.  She appears to be "brighter".  Her pain medication usage is minimal at this time.  The last dosing was last evening or very early morning.  I broached the subject of an feeding tube, but she declines at this time.  She has ordered a more substantial lunch for today.  Plan: 1) Pain control. 2) Nutrition.   LOS: 9 days   Nnamdi Dacus D 01/02/2015, 10:33 AM

## 2015-01-03 DIAGNOSIS — R197 Diarrhea, unspecified: Secondary | ICD-10-CM

## 2015-01-03 DIAGNOSIS — R1013 Epigastric pain: Secondary | ICD-10-CM

## 2015-01-03 LAB — BASIC METABOLIC PANEL
Anion gap: 9 (ref 5–15)
BUN: 15 mg/dL (ref 6–23)
CO2: 33 mmol/L — ABNORMAL HIGH (ref 19–32)
Calcium: 7.9 mg/dL — ABNORMAL LOW (ref 8.4–10.5)
Chloride: 94 mmol/L — ABNORMAL LOW (ref 96–112)
Creatinine, Ser: 0.75 mg/dL (ref 0.50–1.10)
GFR calc Af Amer: >90 mL/min (ref >90)
GFR calc non Af Amer: 88 mL/min — ABNORMAL LOW (ref >90)
Glucose, Bld: 141 mg/dL — ABNORMAL HIGH (ref 70–99)
Potassium: 3.9 mmol/L (ref 3.5–5.1)
Sodium: 136 mmol/L (ref 135–145)

## 2015-01-03 LAB — CBC
HCT: 29.5 % — ABNORMAL LOW (ref 36.0–46.0)
Hemoglobin: 9.8 g/dL — ABNORMAL LOW (ref 12.0–15.0)
MCH: 28.8 pg (ref 26.0–34.0)
MCHC: 33.2 g/dL (ref 30.0–36.0)
MCV: 86.8 fL (ref 78.0–100.0)
Platelets: 627 10*3/uL — ABNORMAL HIGH (ref 150–400)
RBC: 3.4 MIL/uL — ABNORMAL LOW (ref 3.87–5.11)
RDW: 15 % (ref 11.5–15.5)
WBC: 32.1 10*3/uL — ABNORMAL HIGH (ref 4.0–10.5)

## 2015-01-03 LAB — BRAIN NATRIURETIC PEPTIDE: B Natriuretic Peptide: 237.3 pg/mL — ABNORMAL HIGH (ref 0.0–100.0)

## 2015-01-03 MED ORDER — LEVALBUTEROL HCL 0.63 MG/3ML IN NEBU
0.6300 mg | INHALATION_SOLUTION | Freq: Two times a day (BID) | RESPIRATORY_TRACT | Status: DC
  Administered 2015-01-04 – 2015-01-07 (×6): 0.63 mg via RESPIRATORY_TRACT
  Filled 2015-01-03 (×7): qty 3

## 2015-01-03 MED ORDER — PANTOPRAZOLE SODIUM 40 MG PO TBEC
40.0000 mg | DELAYED_RELEASE_TABLET | Freq: Every day | ORAL | Status: DC
  Administered 2015-01-04 – 2015-01-07 (×4): 40 mg via ORAL
  Filled 2015-01-03 (×7): qty 1

## 2015-01-03 NOTE — Progress Notes (Signed)
     [facility] Progress Note  Subjective:  Feeling better.  Requiring very little pain medication.  Tolerating some solid food.  Had 3 episodes of diarrhea with incontinence this morning so stool studies have been ordered.  Objective:  Vital signs in last 24 hours: Temp:  [98.4 F (36.9 C)-99.2 F (37.3 C)] 98.6 F (37 C) (01/23 0526) Pulse Rate:  [98-109] 98 (01/23 0526) Resp:  [18-20] 18 (01/23 0526) BP: (131-142)/(52-58) 131/58 mmHg (01/23 0526) SpO2:  [91 %-98 %] 94 % (01/23 0730) Weight:  [151 lb 12.8 oz (68.856 kg)] 151 lb 12.8 oz (68.856 kg) (01/23 0526) Last BM Date: 01/02/15 General:  Alert, Well-developed, in NAD Heart:  Regular rate and rhythm; no murmurs Pulm:  CTAB.  No W/R/R. Abdomen:  Soft, non-distended. BS present.  Moderate epigastric TTP still present. Extremities:  Without edema. Neurologic:  Alert and  oriented x4;  grossly normal neurologically. Psych:  Alert and cooperative. Normal mood and affect.  Intake/Output from previous day: 01/22 0701 - 01/23 0700 In: 480 [P.O.:480] Out: 1450 [Urine:1450] Intake/Output this shift: Total I/O In: 240 [P.O.:240] Out: -   Lab Results:  Recent Labs  01/01/15 0443 01/02/15 0518 01/03/15 0513  WBC 34.9* 32.2* 32.1*  HGB 10.3* 9.7* 9.8*  HCT 29.4* 28.6* 29.5*  PLT 562* 638* 627*   BMET  Recent Labs  01/01/15 0443 01/02/15 0518 01/03/15 0513  NA 133* 136 136  K 3.5 3.1* 3.9  CL 100 96 94*  CO2 24 31 33*  GLUCOSE 121* 120* 141*  BUN 19 16 15   CREATININE 0.74 0.73 0.75  CALCIUM 7.9* 7.8* 7.9*   LFT  Recent Labs  01/01/15 0443  PROT 5.1*  ALBUMIN 2.1*  AST 17  ALT 18  ALKPHOS 182*  BILITOT 0.7   Dg Chest 2 View  01/02/2015   CLINICAL DATA:  Shortness of breath. History of lymphoma and breast cancer.  EXAM: CHEST  2 VIEW  COMPARISON:  Chest radiograph 12/31/2014  FINDINGS: Stable cardiac and mediastinal contours. Unchanged moderate bilateral pleural effusions with underlying  consolidative pulmonary opacities. No definite pneumothorax. Upper abdominal surgical clips. Mid thoracic spine degenerative changes.  IMPRESSION: Stable bilateral pleural effusions and underlying consolidative opacities.   Electronically Signed   By: Lovey Newcomer M.D.   On: 01/02/2015 08:20    Assessment / Plan: 1) Severe acute pancreatitis. 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 3) Fluid overload:  Receiving IV lasix per primary service. 4) Diarrhea:  Had 3 episodes with incontinence this morning.  They are checking Cdiff and GI pathogen panel.  Doing well from GI standpoint.  Her pain medication usage is minimal.  Tolerating some soft diet.  Plan: 1) Pain control. 2) Follow-up stool studies.   LOS: 10 days   ZEHR, JESSICA D.  01/03/2015, 9:19 AM  Pager number [phone]  GI ATTENDING  Case discussed with Dr. Benson Norway. Interval history data reviewed. Patient seen and examined. Agree with above progress note. She continues to make good progress. New  Problem of diarrhea being evaluated.  Docia Chuck. Geri Seminole., M.D. [facility] Division of Gastroenterology

## 2015-01-03 NOTE — Progress Notes (Addendum)
Patient ID: Patricia [name], female   DOB: [birth year], 64 y.o.   MRN: [id number] [facility] PROGRESS NOTE  NAKYAH ERDMANN [id number] DOB: 1951-06-29 DOA: 12/24/2014 PCP: Tommy Medal, MD  Brief narrative:    Pt is 64 yo female who was admitted for evaluation of epigastric pain associated with N/V, poor oral intake. CT abd notable for developing pancreatitis, GI team consulted and [facility] asked to admit.   Assessment/Plan:   Acute pancreatitis  - ? biliary source according to GI.  - lipase has been WNL > 72 hours, oral intake slowly improving  - continue PRN antiemetics and analgesics - per GI no antibiotics at this point - appreciate GI assistance  Diarrhea, 1/22 - pt explains she has not had any stool softeners - GI stool panel requested  HTN - reasonable inpatient control  - continue metoprolol per home medical regimen  Acute hypoxic respiratory failure - clinically consistent with pulmonary vascular congestion and confirmed with CXR and mildly elevated BNP - oxygen saturations now stable and at target range  - pt given one dose of Lasix 1/21 (40 mg IV x 1 dose), will continue same regimen with Lasix 40 mg IV QD - pt's weight at baseline is 135 lbs, here as high as 153 lbs, now trending down: 151 lbs this AM  - BNP is trending down  - continue to monitor daily weights, strict I's and O's  Glaucoma - continue xalatan Hypokalemia - from Lasix, supplemented and WNL this AM GERD - continue PPI Hyperglycemia - stable CBG's Acute kidney injury - pre renal in etiology - Cr has been stable and WNL  Hyponatremia - also pre renal  - remains stable and is WNL Hodgkin lymphoma - outpatient follow up Leukocytosis  - secondary to the above, repeat CBC in AM  SCD's for DVT prophylaxis  Code Status: DNR Family Communication: no family at bedside Disposition Plan: home when medically stable   IV access:  Peripheral IV Procedures and diagnostic  studies:    CXR 12/26/2014 No pulmonary edema. Bilateral small pleural effusion L>R with bilateral basilar atelectasis or infiltrate.   US Abdomen Complete 12/25/2014 Findings consistent with pancreatitis.   Ct Abdomen Pelvis W Contrast 12/24/2014 Significant acute pancreatitis, with apparent mild devascularization at the body of the pancreas. No definite pseudocyst formation seen. Moderate amount of surrounding free fluid tracks anterior to Gerota's fascia bilaterally, extends about the liver, and extends inferiorly along the paracolic gutters into the pelvis. 2. Small left pleural effusion, with associated atelectasis. 3. 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 4. Tiny bilateral renal cysts suggested. 5. Mild calcification at the origins of the celiac trunk and bilateral renal arteries. 6. Calcification incidentally noted at the mitral valve.   CXR 01/02/2015  Stable bilateral pleural effusions and underlying consolidative opacities.    Medical Consultants:  GI Other Consultants:  None  IAnti-Infectives:   None  Faye Ramsay, MD  [facility] Pager [phone]  If 7PM-7AM, please contact night-coverage [email or web address] Password [facility] 01/03/2015, 12:26 PM   LOS: 10 days   HPI/Subjective: No events overnight.   Objective: Filed Vitals:   01/02/15 2001 01/02/15 2050 01/03/15 0526 01/03/15 0730  BP:  134/52 131/58   Pulse:  109 98   Temp:  98.4 F (36.9 C) 98.6 F (37 C)   TempSrc:  Oral Oral   Resp:  20 18   Height:      Weight:   68.856 kg (151 lb 12.8 oz)  SpO2: 92% 91% 93% 94%    Intake/Output Summary (Last 24 hours) at 01/03/15 1226 Last data filed at 01/03/15 9833  Gross per 24 hour  Intake    480 ml  Output    950 ml  Net   -470 ml    Exam:   General:  Pt is alert, follows commands appropriately, not in acute distress  Cardiovascular: Regular rate and rhythm, S1/S2, no murmurs, no rubs, no  gallops  Respiratory: Clear to auscultation bilaterally, bibasilar crackles, no wheezing   Abdomen: Soft, non tender, non distended, bowel sounds present, no guarding  Extremities: +2 bilateral LE pitting edema, pulses DP and PT palpable bilaterally  Neuro: Grossly nonfocal  Data Reviewed: Basic Metabolic Panel:  Recent Labs Lab 12/29/14 0417 12/31/14 0459 01/01/15 0443 01/02/15 0518 01/03/15 0513  NA 141 136 133* 136 136  K 4.1 3.2* 3.5 3.1* 3.9  CL 109 101 100 96 94*  CO2 19 27 24 31  33*  GLUCOSE 108* 141* 121* 120* 141*  BUN 22 24* 19 16 15   CREATININE 0.82 0.80 0.74 0.73 0.75  CALCIUM 8.6 8.4 7.9* 7.8* 7.9*   Liver Function Tests:  Recent Labs Lab 12/28/14 0500 01/01/15 0443  AST 19 17  ALT 14 18  ALKPHOS 76 182*  BILITOT 0.7 0.7  PROT 6.0 5.1*  ALBUMIN 2.8* 2.1*    Recent Labs Lab 12/28/14 0500 12/31/14 0459  LIPASE 32 19   CBC:  Recent Labs Lab 12/28/14 0500 12/31/14 0459 01/01/15 0443 01/02/15 0518 01/03/15 0513  WBC 27.3* 31.4* 34.9* 32.2* 32.1*  HGB 11.0* 10.4* 10.3* 9.7* 9.8*  HCT 34.3* 31.6* 29.4* 28.6* 29.5*  MCV 91.2 86.6 86.5 86.1 86.8  PLT 474* 580* 562* 638* 627*   Recent Results (from the past 240 hour(s))  Urine culture     Status: None   Collection Time: 12/25/14 12:23 AM  Result Value Ref Range Status   Specimen Description URINE, CLEAN CATCH  Final   Special Requests NONE  Final   Colony Count NO GROWTH Performed at [facility]   Final   Culture NO GROWTH Performed at [facility]   Final   Report Status 12/26/2014 FINAL  Final  Culture, blood (routine x 2)     Status: None   Collection Time: 12/25/14  1:00 AM  Result Value Ref Range Status   Specimen Description BLOOD RIGHT FOREARM  Final   Special Requests BOTTLES DRAWN AEROBIC AND ANAEROBIC 5CC  Final   Culture   Final    NO GROWTH 5 DAYS Performed at [facility]    Report Status 12/31/2014 FINAL  Final  Culture, blood (routine x  2)     Status: None   Collection Time: 12/25/14  1:02 AM  Result Value Ref Range Status   Specimen Description BLOOD RIGHT HAND  Final   Special Requests BOTTLES DRAWN AEROBIC AND ANAEROBIC 5CC  Final   Culture   Final    NO GROWTH 5 DAYS Performed at [facility]    Report Status 12/31/2014 FINAL  Final     Scheduled Meds: . furosemide  40 mg Intravenous Daily  . latanoprost  1 drop Both Eyes QHS  . levalbuterol  0.63 mg Nebulization TID  . metoprolol tartrate  50 mg Oral BID  . Pantoprazole IV  40 mg Intravenous Q24H   Continuous Infusions:

## 2015-01-04 LAB — CBC
HCT: 29 % — ABNORMAL LOW (ref 36.0–46.0)
Hemoglobin: 9.6 g/dL — ABNORMAL LOW (ref 12.0–15.0)
MCH: 29 pg (ref 26.0–34.0)
MCHC: 33.1 g/dL (ref 30.0–36.0)
MCV: 87.6 fL (ref 78.0–100.0)
Platelets: 705 10*3/uL — ABNORMAL HIGH (ref 150–400)
RBC: 3.31 MIL/uL — ABNORMAL LOW (ref 3.87–5.11)
RDW: 15.2 % (ref 11.5–15.5)
WBC: 27.1 10*3/uL — ABNORMAL HIGH (ref 4.0–10.5)

## 2015-01-04 LAB — BASIC METABOLIC PANEL
Anion gap: 9 (ref 5–15)
BUN: 15 mg/dL (ref 6–23)
CO2: 36 mmol/L — ABNORMAL HIGH (ref 19–32)
Calcium: 8.1 mg/dL — ABNORMAL LOW (ref 8.4–10.5)
Chloride: 93 mmol/L — ABNORMAL LOW (ref 96–112)
Creatinine, Ser: 0.79 mg/dL (ref 0.50–1.10)
GFR calc Af Amer: >90 mL/min (ref >90)
GFR calc non Af Amer: 86 mL/min — ABNORMAL LOW (ref >90)
Glucose, Bld: 141 mg/dL — ABNORMAL HIGH (ref 70–99)
Potassium: 4.1 mmol/L (ref 3.5–5.1)
Sodium: 138 mmol/L (ref 135–145)

## 2015-01-04 LAB — BRAIN NATRIURETIC PEPTIDE: B Natriuretic Peptide: 223.8 pg/mL — ABNORMAL HIGH (ref 0.0–100.0)

## 2015-01-04 MED ORDER — FUROSEMIDE 40 MG PO TABS
40.0000 mg | ORAL_TABLET | Freq: Every day | ORAL | Status: DC
  Administered 2015-01-04 – 2015-01-05 (×2): 40 mg via ORAL
  Filled 2015-01-04 (×2): qty 1

## 2015-01-04 NOTE — Progress Notes (Signed)
Patient ID: Patricia [name], female   DOB: 1951-08-27, 64 y.o.   MRN: [id number] [facility] PROGRESS NOTE  Patricia [name] [id number] DOB: Jul 05, 1951 DOA: 12/24/2014 PCP: Tommy Medal, MD   Brief narrative:    Pt is 64 yo female who was admitted for evaluation of epigastric pain associated with N/V, poor oral intake. CT abd notable for developing pancreatitis, GI team consulted and [facility] asked to admit.   Assessment/Plan:   Acute pancreatitis   - lipase has been WNL > 72 hours, oral intake slowly improving  - continue PRN antiemetics and analgesics - appreciate GI assistance  Diarrhea, 1/2 - GI stool panel requested but pt has not had diarrhea since yesterday - will d/c order for now  HTN - reasonable inpatient control  - continue metoprolol per home medical regimen  Acute hypoxic respiratory failure - clinically consistent with pulmonary vascular congestion and confirmed with CXR and mildly elevated BNP - oxygen saturations now stable and at target range on RA - pt given one dose of Lasix 1/21 (40 mg IV x 1 dose), will continue same regimen with Lasix but will transition to PO regimen today 1/24 - pt's weight at baseline is 135 lbs, here as high as 153 lbs, now trending down: 151 lbs --> 148 lbs this AM - BNP is trending down  - continue to monitor daily weights, strict I's and O's  Glaucoma - continue xalatan Hypokalemia - from Lasix, supplemented and WNL this AM GERD - continue PPI Hyperglycemia - stable CBG's Acute kidney injury - pre renal in etiology - Cr has been stable and WNL  Hyponatremia - also pre renal  - remains stable and is WNL Hodgkin lymphoma - outpatient follow up Leukocytosis  - secondary to the above, WBC is trending down   SCD's for DVT prophylaxis  Code Status: DNR Family Communication: no family at bedside Disposition Plan: home in 1-2 days    IV access:  Peripheral IV Procedures and diagnostic  studies:    CXR 12/26/2014 No pulmonary edema. Bilateral small pleural effusion L>R with bilateral basilar atelectasis or infiltrate.   US Abdomen Complete 12/25/2014 Findings consistent with pancreatitis.   Ct Abdomen Pelvis W Contrast 12/24/2014 Significant acute pancreatitis, with apparent mild devascularization at the body of the pancreas. No definite pseudocyst formation seen. Moderate amount of surrounding free fluid tracks anterior to Gerota's fascia bilaterally, extends about the liver, and extends inferiorly along the paracolic gutters into the pelvis. 2. Small left pleural effusion, with associated atelectasis. 3. 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 4. Tiny bilateral renal cysts suggested. 5. Mild calcification at the origins of the celiac trunk and bilateral renal arteries. 6. Calcification incidentally noted at the mitral valve.   CXR 01/02/2015 Stable bilateral pleural effusions and underlying consolidative opacities.  Medical Consultants:  GI Other Consultants:  None  IAnti-Infectives:   None    Faye Ramsay, MD  [facility] Pager [phone]  If 7PM-7AM, please contact night-coverage [email or web address] Password [facility] 01/04/2015, 11:53 AM   LOS: 11 days   HPI/Subjective: No events overnight.   Objective: Filed Vitals:   01/03/15 2319 01/03/15 2320 01/04/15 0558 01/04/15 0921  BP: 153/50  142/58   Pulse:  111 98 98  Temp:   98.2 F (36.8 C)   TempSrc:   Oral   Resp:   16 16  Height:      Weight:   67.45 kg (148 lb 11.2 oz)   SpO2:   94% 97%  Intake/Output Summary (Last 24 hours) at 01/04/15 1153 Last data filed at 01/04/15 1105  Gross per 24 hour  Intake    840 ml  Output   2050 ml  Net  -1210 ml    Exam:   General:  Pt is alert, follows commands appropriately, not in acute distress  Cardiovascular: Regular rate and rhythm, S1/S2, no murmurs, no rubs, no gallops  Respiratory:  Clear to auscultation bilaterally, no wheezing, mild bibasilar crackles   Abdomen: Soft, non tender, non distended, bowel sounds present, no guarding  Extremities: +1 bilateral LE pitting edema, pulses DP and PT palpable bilaterally  Neuro: Grossly nonfocal  Data Reviewed: Basic Metabolic Panel:  Recent Labs Lab 12/31/14 0459 01/01/15 0443 01/02/15 0518 01/03/15 0513 01/04/15 0514  NA 136 133* 136 136 138  K 3.2* 3.5 3.1* 3.9 4.1  CL 101 100 96 94* 93*  CO2 27 24 31  33* 36*  GLUCOSE 141* 121* 120* 141* 141*  BUN 24* 19 16 15 15   CREATININE 0.80 0.74 0.73 0.75 0.79  CALCIUM 8.4 7.9* 7.8* 7.9* 8.1*   Liver Function Tests:  Recent Labs Lab 01/01/15 0443  AST 17  ALT 18  ALKPHOS 182*  BILITOT 0.7  PROT 5.1*  ALBUMIN 2.1*    Recent Labs Lab 12/31/14 0459  LIPASE 19   CBC:  Recent Labs Lab 12/31/14 0459 01/01/15 0443 01/02/15 0518 01/03/15 0513 01/04/15 0514  WBC 31.4* 34.9* 32.2* 32.1* 27.1*  HGB 10.4* 10.3* 9.7* 9.8* 9.6*  HCT 31.6* 29.4* 28.6* 29.5* 29.0*  MCV 86.6 86.5 86.1 86.8 87.6  PLT 580* 562* 638* 627* 705*   Scheduled Meds: . antiseptic oral rinse  7 mL Mouth Rinse q12n4p  . chlorhexidine  15 mL Mouth Rinse BID  . furosemide  40 mg Intravenous Daily  . latanoprost  1 drop Both Eyes QHS  . levalbuterol  0.63 mg Nebulization BID  . metoprolol tartrate  50 mg Oral BID  . pantoprazole  40 mg Oral Daily  . sodium chloride  3 mL Intravenous Q12H   Continuous Infusions:

## 2015-01-04 NOTE — Progress Notes (Signed)
     [facility] Progress Note  Subjective:  Feeling a little better every day.  Taking pain medication before she eats.  No more diarrhea so far so stool studies haven't been collected.  Has been walking around the halls.  Objective:  Vital signs in last 24 hours: Temp:  [98.2 F (36.8 C)-98.9 F (37.2 C)] 98.2 F (36.8 C) (01/24 0558) Pulse Rate:  [98-111] 98 (01/24 0558) Resp:  [16-18] 16 (01/24 0558) BP: (142-153)/(50-64) 142/58 mmHg (01/24 0558) SpO2:  [93 %-98 %] 94 % (01/24 0558) Weight:  [148 lb 11.2 oz (67.45 kg)] 148 lb 11.2 oz (67.45 kg) (01/24 0558) Last BM Date: 01/02/15 General:  Alert, Well-developed, in NAD Heart:  Regular rate and rhythm; no murmurs Pulm:  CTAB.  No W/R/R. Abdomen:  Soft, slightly distended.  BS present.  Still has moderate TTP in upper abdomen.   Extremities:  LE pitting edema noted. Neurologic:  Alert and  oriented x 4;  grossly normal neurologically. Psych:  Alert and cooperative. Normal mood and affect.  Intake/Output from previous day: 01/23 0701 - 01/24 0700 In: 840 [P.O.:840] Out: 1050 [Urine:1050]  Lab Results:  Recent Labs  01/02/15 0518 01/03/15 0513 01/04/15 0514  WBC 32.2* 32.1* 27.1*  HGB 9.7* 9.8* 9.6*  HCT 28.6* 29.5* 29.0*  PLT 638* 627* 705*   BMET  Recent Labs  01/02/15 0518 01/03/15 0513 01/04/15 0514  NA 136 136 138  K 3.1* 3.9 4.1  CL 96 94* 93*  CO2 31 33* 36*  GLUCOSE 120* 141* 141*  BUN 16 15 15   CREATININE 0.73 0.75 0.79  CALCIUM 7.8* 7.9* 8.1*   Assessment / Plan: 1) Severe acute pancreatitis. 2) Leukocystosis secondary to pancreatitis: WBC count still quite high, but mproving. 3) Fluid overload: Receiving IV lasix per primary service. 4) Diarrhea: Had 3 episodes with incontinence yesterday AM but none since that time. Cdiff and GI pathogen panel ordered, but have not yet been able to collect.  Doing well from GI standpoint. Her pain medication usage is minimal.  Tolerating soft diet.  Plan: 1) Pain control. 2) Follow-up stool studies if diarrhea recurs.   LOS: 11 days   ZEHR, JESSICA D.  01/04/2015, 8:54 AM  Pager number [phone]  GI ATTENDING  Interval history and data reviewed. Patient seen and examined. Agree with above progress note. Overall better with less pain and resolution of diarrhea. Leukocytosis persists, but better. Ambulating. Continue with ongoing supportive care with advancement of diet as tolerated. Dr. Benson Norway returns tomorrow to resume GI care.  Docia Chuck. Geri Seminole., M.D. [facility] Division of Gastroenterology

## 2015-01-05 LAB — CBC
HCT: 27.7 % — ABNORMAL LOW (ref 36.0–46.0)
Hemoglobin: 9.2 g/dL — ABNORMAL LOW (ref 12.0–15.0)
MCH: 29.3 pg (ref 26.0–34.0)
MCHC: 33.2 g/dL (ref 30.0–36.0)
MCV: 88.2 fL (ref 78.0–100.0)
Platelets: 700 10*3/uL — ABNORMAL HIGH (ref 150–400)
RBC: 3.14 MIL/uL — ABNORMAL LOW (ref 3.87–5.11)
RDW: 15.2 % (ref 11.5–15.5)
WBC: 23.8 10*3/uL — ABNORMAL HIGH (ref 4.0–10.5)

## 2015-01-05 LAB — BASIC METABOLIC PANEL
Anion gap: 8 (ref 5–15)
BUN: 14 mg/dL (ref 6–23)
CO2: 36 mmol/L — ABNORMAL HIGH (ref 19–32)
Calcium: 8 mg/dL — ABNORMAL LOW (ref 8.4–10.5)
Chloride: 91 mmol/L — ABNORMAL LOW (ref 96–112)
Creatinine, Ser: 0.83 mg/dL (ref 0.50–1.10)
GFR calc Af Amer: 85 mL/min — ABNORMAL LOW (ref >90)
GFR calc non Af Amer: 73 mL/min — ABNORMAL LOW (ref >90)
Glucose, Bld: 131 mg/dL — ABNORMAL HIGH (ref 70–99)
Potassium: 3.9 mmol/L (ref 3.5–5.1)
Sodium: 135 mmol/L (ref 135–145)

## 2015-01-05 MED ORDER — DOCUSATE SODIUM 100 MG PO CAPS
100.0000 mg | ORAL_CAPSULE | Freq: Every evening | ORAL | Status: DC | PRN
Start: 2015-01-05 — End: 2015-01-07
  Administered 2015-01-05: 100 mg via ORAL
  Filled 2015-01-05 (×2): qty 1

## 2015-01-05 MED ORDER — FUROSEMIDE 10 MG/ML IJ SOLN
40.0000 mg | Freq: Every day | INTRAMUSCULAR | Status: DC
  Administered 2015-01-05 – 2015-01-07 (×3): 40 mg via INTRAVENOUS
  Filled 2015-01-05 (×3): qty 4

## 2015-01-05 MED ORDER — PANCRELIPASE (LIP-PROT-AMYL) 12000-38000 UNITS PO CPEP
12000.0000 [IU] | ORAL_CAPSULE | Freq: Three times a day (TID) | ORAL | Status: DC
  Administered 2015-01-06 – 2015-01-07 (×4): 12000 [IU] via ORAL
  Filled 2015-01-05 (×8): qty 1

## 2015-01-05 NOTE — Progress Notes (Signed)
Patient ID: Patricia [name], female   DOB: 24-Feb-1951, 64 y.o.   MRN: [id number]  [facility] PROGRESS NOTE  LEODA SMITHHART JFH:[id number] DOB: 15-Jul-1951 DOA: 12/24/2014 PCP: Tommy Medal, MD  Brief narrative:    Pt is 64 yo female who was admitted for evaluation of epigastric pain associated with N/V, poor oral intake. CT abd notable for developing pancreatitis, GI team consulted and [facility] asked to admit.   Assessment/Plan:   Acute pancreatitis  - lipase has been WNL > 72 hours, pt reports ongoing poor appetite, denies nausea and vomiting  - continue PRN antiemetics and analgesics - appreciate GI assistance  Diarrhea, 1/2 - GI stool panel requested but pt has not had diarrhea since 1/24 - no need for stool analysis at this time  HTN - reasonable inpatient control  - continue metoprolol per home medical regimen  Acute hypoxic respiratory failure - clinically consistent with pulmonary vascular congestion and confirmed with CXR and mildly elevated BNP - oxygen saturations now stable and at target range on RA - pt given one dose of Lasix 1/21 (40 mg IV x 1 dose), transitioned to PO regimen 1/24 - pt's weight at baseline is 135 lbs, here as high as 153 lbs, now trending down: 151 lbs --> 148 lbs --> 146 lbs this AM - BNP is trending down  - continue to monitor daily weights, strict I's and O's  - may need to change to IV Lasix if now improvement in diuresis  Glaucoma - continue xalatan Hypokalemia - supplemented and WNL this AM GERD - continue PPI Hyperglycemia - stable CBG's Acute kidney injury - pre renal in etiology - Cr has been stable and WNL  Hyponatremia - also pre renal  - remains stable and is WNL Hodgkin lymphoma - outpatient follow up Leukocytosis  - secondary to the above, WBC is trending down   SCD's for DVT prophylaxis  Code Status: DNR Family Communication: no family at bedside Disposition Plan: home possibly in AM  IV  access:  Peripheral IV Procedures and diagnostic studies:    CXR 12/26/2014 No pulmonary edema. Bilateral small pleural effusion L>R with bilateral basilar atelectasis or infiltrate.   US Abdomen Complete 12/25/2014 Findings consistent with pancreatitis.   Ct Abdomen Pelvis W Contrast 12/24/2014 Significant acute pancreatitis, with apparent mild devascularization at the body of the pancreas. No definite pseudocyst formation seen. Moderate amount of surrounding free fluid tracks anterior to Gerota's fascia bilaterally, extends about the liver, and extends inferiorly along the paracolic gutters into the pelvis. 2. Small left pleural effusion, with associated atelectasis. 3. 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 4. Tiny bilateral renal cysts suggested. 5. Mild calcification at the origins of the celiac trunk and bilateral renal arteries. 6. Calcification incidentally noted at the mitral valve.   CXR 01/02/2015 Stable bilateral pleural effusions and underlying consolidative opacities.  Medical Consultants:  GI Other Consultants:  None  IAnti-Infectives:   None   Faye Ramsay, MD  [facility] Pager [phone]  If 7PM-7AM, please contact night-coverage [email or web address] Password [email or web address] 01/05/2015, 11:00 AM   LOS: 12 days   HPI/Subjective: No events overnight.   Objective: Filed Vitals:   01/04/15 2057 01/04/15 2112 01/05/15 0601 01/05/15 0746  BP: 150/61  145/59   Pulse: 104  103   Temp: 98.9 F (37.2 C)  97.8 F (36.6 C)   TempSrc: Oral  Oral   Resp: 18  18   Height:      Weight:  66.316 kg (146 lb 3.2 oz)   SpO2: 95% 100% 94% 94%    Intake/Output Summary (Last 24 hours) at 01/05/15 1100 Last data filed at 01/05/15 0910  Gross per 24 hour  Intake    840 ml  Output   3400 ml  Net  -2560 ml    Exam:   General:  Pt is alert, follows commands appropriately, not in acute  distress  Cardiovascular: Regular rate and rhythm, S1/S2, no murmurs, no rubs, no gallops  Respiratory: Clear to auscultation bilaterally, no wheezing, bibasilar crackles   Abdomen: Soft, non tender, non distended, bowel sounds present, no guarding  Extremities: +2 bilateral LE pitting edema, pulses DP and PT palpable bilaterally  Neuro: Grossly nonfocal  Data Reviewed: Basic Metabolic Panel:  Recent Labs Lab 01/01/15 0443 01/02/15 0518 01/03/15 0513 01/04/15 0514 01/05/15 0435  NA 133* 136 136 138 135  K 3.5 3.1* 3.9 4.1 3.9  CL 100 96 94* 93* 91*  CO2 24 31 33* 36* 36*  GLUCOSE 121* 120* 141* 141* 131*  BUN 19 16 15 15 14   CREATININE 0.74 0.73 0.75 0.79 0.83  CALCIUM 7.9* 7.8* 7.9* 8.1* 8.0*   Liver Function Tests:  Recent Labs Lab 01/01/15 0443  AST 17  ALT 18  ALKPHOS 182*  BILITOT 0.7  PROT 5.1*  ALBUMIN 2.1*    Recent Labs Lab 12/31/14 0459  LIPASE 19   CBC:  Recent Labs Lab 01/01/15 0443 01/02/15 0518 01/03/15 0513 01/04/15 0514 01/05/15 0435  WBC 34.9* 32.2* 32.1* 27.1* 23.8*  HGB 10.3* 9.7* 9.8* 9.6* 9.2*  HCT 29.4* 28.6* 29.5* 29.0* 27.7*  MCV 86.5 86.1 86.8 87.6 88.2  PLT 562* 638* 627* 705* 700*    Scheduled Meds: . antiseptic oral rinse  7 mL Mouth Rinse q12n4p  . chlorhexidine  15 mL Mouth Rinse BID  . furosemide  40 mg Oral Daily  . latanoprost  1 drop Both Eyes QHS  . levalbuterol  0.63 mg Nebulization BID  . metoprolol tartrate  50 mg Oral BID  . pantoprazole  40 mg Oral Daily   Continuous Infusions:

## 2015-01-05 NOTE — Progress Notes (Signed)
Subjective: Since I last evaluated the patient, her overall condition seems to have improved buut she is still having a lot of abdominal pain postprandially, She rates it at 7/10 in intensity. No BM for the last 2 days. Very anxious and wants to "figure this out". She is NOT a DNR.    Objective: Vital signs in last 24 hours: Temp:  [97.8 F (36.6 C)-98.9 F (37.2 C)] 98.2 F (36.8 C) (01/25 1316) Pulse Rate:  [103-109] 109 (01/25 1316) Resp:  [18] 18 (01/25 1316) BP: (132-150)/(54-61) 132/54 mmHg (01/25 1316) SpO2:  [94 %-100 %] 94 % (01/25 1316) Weight:  [66.316 kg (146 lb 3.2 oz)] 66.316 kg (146 lb 3.2 oz) (01/25 0601) Last BM Date: 01/03/15  Intake/Output from previous day: 01/24 0701 - 01/25 0700 In: 840 [P.O.:840] Out: 3100 [Urine:3100] Intake/Output this shift: Total I/O In: 480 [P.O.:480] Out: 1400 [Urine:1400]  General appearance: alert, cooperative and mild distress Resp: clear to auscultation bilaterally Cardio: regular rate and rhythm, S1, S2 normal, no murmur, click, rub or gallop GI: soft with periumbilcal tenderness on palpation with gaurding but without rebound or rigidity; bowel sounds are hypoactive; no masses,  no organomegaly Extremities: extremities normal, atraumatic, no cyanosis or edema  Lab Results:  Recent Labs  01/03/15 0513 01/04/15 0514 01/05/15 0435  WBC 32.1* 27.1* 23.8*  HGB 9.8* 9.6* 9.2*  HCT 29.5* 29.0* 27.7*  PLT 627* 705* 700*   BMET  Recent Labs  01/03/15 0513 01/04/15 0514 01/05/15 0435  NA 136 138 135  K 3.9 4.1 3.9  CL 94* 93* 91*  CO2 33* 36* 36*  GLUCOSE 141* 141* 131*  BUN 15 15 14   CREATININE 0.75 0.79 0.83  CALCIUM 7.9* 8.1* 8.0*    Medications: I have reviewed the patient's current medications.  Assessment/Plan: 1) Epigastric pain with acute pancreatitis-leukocytosis: try pancreatic enzyme supplements and get another CT scan of the abdomen and pelvis in the morning. 2) Drug induced constipation; try Colace  100 mg 2 PO BID, PRN. 3) Anemia-follow CBC's closely.  4) As per my discussion with the patient she is NOT DNR. Status changed to FULL CODE.     LOS: 12 days   Supriya Beaston 01/05/2015, 4:44 PM

## 2015-01-05 NOTE — Progress Notes (Signed)
[name] order was discontinued and order placed for Full Code status per Dr. Collene Mares.  Dr. Collene Mares gave verbal order to make code status change after conversation with pt. Patricia [name]

## 2015-01-06 ENCOUNTER — Inpatient Hospital Stay (HOSPITAL_COMMUNITY): Payer: BC Managed Care – PPO

## 2015-01-06 ENCOUNTER — Encounter (HOSPITAL_COMMUNITY): Payer: Self-pay | Admitting: Radiology

## 2015-01-06 MED ORDER — IOHEXOL 300 MG/ML  SOLN
25.0000 mL | INTRAMUSCULAR | Status: AC
  Administered 2015-01-06 (×2): 25 mL via ORAL

## 2015-01-06 MED ORDER — BOOST / RESOURCE BREEZE PO LIQD
1.0000 | Freq: Three times a day (TID) | ORAL | Status: DC
  Administered 2015-01-06 – 2015-01-07 (×2): 1 via ORAL
  Filled 2015-01-06 (×2): qty 1

## 2015-01-06 MED ORDER — IOHEXOL 300 MG/ML  SOLN
100.0000 mL | Freq: Once | INTRAMUSCULAR | Status: AC | PRN
  Administered 2015-01-06: 100 mL via INTRAVENOUS

## 2015-01-06 NOTE — Progress Notes (Signed)
Pt transferred to 6th floor report given to [facility], pt stable no acute. Placed to bed. Personal belongings with pt. SRP, RN

## 2015-01-06 NOTE — Progress Notes (Signed)
INITIAL NUTRITION ASSESSMENT  DOCUMENTATION CODES Per approved criteria  -Not Applicable   INTERVENTION: - Encourage po intake - Resource Breeze po TID, each supplement provides 250 kcal and 9 grams of protein - If pt unable to tolerate po intake, recommend peptide based formula TF via NJ tube - RD will continue to monitor for nutrition care plan.  NUTRITION DIAGNOSIS: Inadequate oral intake related to pancreatitis as evidenced by reported poor po.   Goal: Pt to meet >/= 90% of their estimated nutrition needs   Monitor:  Weight trend, po intake, acceptance of supplements, Need for TF  Reason for Assessment: Consult for nutrition assessment  64 y.o. female  Admitting Dx: <principal problem not specified>  ASSESSMENT: 64 y.o. female has a past medical history of Hodgkin's lymphoma; Ulcer; Breast cancer; and Atrial flutter. Presented with Epigastric pain since 3 pm. She started to have nausea an vomiting. Pain was severe and she came to ER. CT abdomen showed pancreatitis with devascularization at the body of the pancreas.  - Pt and RN report, pt is starting to tolerate diet better. She had egg salad, yogurt, and sherbet for lunch. Pt with 8 lb wt loss since 1/22 mostly due to fluid loss. Discussed with pt to remain on a low-fat, soft diet until pancreatitis is resolved.  Brought pt Resource Breeze to try. PO intake encouraged.  - If pancreatitis worsens, recommend NJ feedings of a peptide based formula such as Vital AF 1.2.  - Pt with mild  Muscle depletion at collarbone and temples.  Labs reviewed  Height: Ht Readings from Last 1 Encounters:  12/25/14 5\' 6"  (1.676 m)    Weight: Wt Readings from Last 1 Encounters:  01/06/15 145 lb 4.5 oz (65.9 kg)    Ideal Body Weight: 130 lbs  % Ideal Body Weight: 112%  Wt Readings from Last 10 Encounters:  01/06/15 145 lb 4.5 oz (65.9 kg)  05/09/14 135 lb (61.236 kg)  03/21/14 140 lb (63.504 kg)  03/18/14 140 lb (63.504 kg)   05/21/13 141 lb 3.2 oz (64.048 kg)  06/08/12 135 lb 8 oz (61.462 kg)  06/07/12 136 lb 12.8 oz (62.052 kg)  05/17/12 134 lb (60.782 kg)  04/26/12 132 lb 12.8 oz (60.238 kg)    Usual Body Weight: 132 lbs  % Usual Body Weight: 110%  BMI:  Body mass index is 23.46 kg/(m^2).  Estimated Nutritional Needs: Kcal: 1650-1950 Protein: 90-100 g Fluid: 1.7-2.0 L/day  Skin: intact  Diet Order: DIET SOFT  EDUCATION NEEDS: -Education needs addressed   Intake/Output Summary (Last 24 hours) at 01/06/15 1636 Last data filed at 01/06/15 1300  Gross per 24 hour  Intake    480 ml  Output   2750 ml  Net  -2270 ml    Last BM: 1/23   Labs:   Recent Labs Lab 01/03/15 0513 01/04/15 0514 01/05/15 0435  NA 136 138 135  K 3.9 4.1 3.9  CL 94* 93* 91*  CO2 33* 36* 36*  BUN 15 15 14   CREATININE 0.75 0.79 0.83  CALCIUM 7.9* 8.1* 8.0*  GLUCOSE 141* 141* 131*    CBG (last 3)  No results for input(s): GLUCAP in the last 72 hours.  Scheduled Meds: . antiseptic oral rinse  7 mL Mouth Rinse q12n4p  . chlorhexidine  15 mL Mouth Rinse BID  . furosemide  40 mg Intravenous Daily  . latanoprost  1 drop Both Eyes QHS  . levalbuterol  0.63 mg Nebulization BID  . lipase/protease/amylase  12,000 Units Oral TID WC  . metoprolol tartrate  50 mg Oral BID  . pantoprazole  40 mg Oral Daily    Continuous Infusions:   Past Medical History  Diagnosis Date  . Hodgkin's lymphoma     Treated with radiation and chemo  . Ulcer   . Breast cancer   . Atrial flutter     Ablated 1998 Dr. Caryl Comes    Past Surgical History  Procedure Laterality Date  . Laparotomy    . Splenectomy  1974    hodgkins disease  . Tonsillectomy and adenoidectomy    . Tee without cardioversion  06/12/2012    Procedure: TRANSESOPHAGEAL ECHOCARDIOGRAM (TEE);  Surgeon: Larey [name], MD;  Location: [facility];  Service: Cardiovascular;  Laterality: N/A;  . Cataract extraction, bilateral    . Mastectomy  2005     Left-axillary  . Colonoscopy    . Cardiac electrophysiology mapping and ablation  1999  . Orif ankle fracture Left 03/21/2014    Procedure: LEFT ANKLE FRACTURE OPEN TREATMENT BILMALLEOLAR INCLUDES INTERNAL FIXATION ;  Surgeon: Renette Butters, MD;  Location: [facility];  Service: Orthopedics;  Laterality: Left;    Laurette Schimke MS, RD, LDN

## 2015-01-06 NOTE — Progress Notes (Signed)
Patient ID: Patricia [name], female   DOB: April 03, 1951, 64 y.o.   MRN: [id number]  [facility] PROGRESS NOTE  Patricia [name] RRN:[id number] DOB: Jan 08, 1951 DOA: 12/24/2014 PCP: Tommy Medal, MD  Brief narrative:    Pt is 64 yo female who was admitted for evaluation of epigastric pain associated with N/V, poor oral intake. CT abd notable for developing pancreatitis, GI team consulted and [facility] asked to admit. Hospital course complicated by persistent nausea and poor oral intake, also became volume overloaded due to IVF in the setting of poor oral intake. Dry weight at baseline 135 lbs and weight was as high as 154 lbs while hospitalized. Pt is now stabilizing from this issue but has had persistent nausea. Repeat CT abd with persistent acute pancreatitis, cysts, necrosis. GI team following as well.   Assessment/Plan:   Acute pancreatitis  - lipase has been WNL > 72 hours, pt reports ongoing poor appetite with nausea but no vomiting - repeat CT abd with persistent acute pancreatitis with pseudocysts and necrosis - appreciate GI team following - pt refusing feeding tube placement  - continue PRN antiemetics and analgesics, continue pancrease supplementation  - nutritionist consulted  Constipation - place on bowel regimen  HTN - reasonable inpatient control  - continue metoprolol per home medical regimen  Acute hypoxic respiratory failure - clinically consistent with pulmonary vascular congestion, pleural effusions, confirmed with CXR and elevated BNP - oxygen saturations now stable and at target range on RA - pt's weight at baseline is 135 lbs, here as high as 154 lbs, now trending down: 151 lbs --> 148 lbs --> 145 lbs this AM - BNP is trending down  - continue to monitor daily weights, strict I's and O's  - continue IV Lasix for now as pt still with LE edema and pleural effusions  Glaucoma - continue xalatan Hypokalemia - supplemented and WNL 1/25 GERD -  continue PPI Hyperglycemia - stable CBG's Acute kidney injury - pre renal in etiology - Cr has been stable and WNL  Hyponatremia - also pre renal  - remains stable and is WNL Hodgkin lymphoma - outpatient follow up Leukocytosis  - secondary to the above, WBC is trending down   SCD's for DVT prophylaxis  Code Status: Full Code  Family Communication: no family at bedside Disposition Plan: home when cleared by GI, pt very anxious to go home, wants to be able to eat prior to discharge   IV access:  Peripheral IV Procedures and diagnostic studies:    CXR 12/26/2014 No pulmonary edema. Bilateral small pleural effusion L>R with bilateral basilar atelectasis or infiltrate.   US Abdomen Complete 12/25/2014 Findings consistent with pancreatitis.   Ct Abdomen Pelvis W Contrast 12/24/2014 Significant acute pancreatitis, with apparent mild devascularization at the body of the pancreas. No definite pseudocyst formation seen. Moderate amount of surrounding free fluid tracks anterior to Gerota's fascia bilaterally, extends about the liver, and extends inferiorly along the paracolic gutters into the pelvis. 2. Small left pleural effusion, with associated atelectasis. 3. 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 4. Tiny bilateral renal cysts suggested. 5. Mild calcification at the origins of the celiac trunk and bilateral renal arteries. 6. Calcification incidentally noted at the mitral valve.   CXR 01/02/2015 Stable bilateral pleural effusions and underlying consolidative opacities.  Ct Abdomen Pelvis W Contrast  01/06/2015  Acute pancreatitis with pancreatic necrosis involving the pancreatic body, and developing pancreatic pseudocysts, largest measuring 9 cm.  Increased bilateral pleural effusions  and bibasilar atelectasis.  Stable small hypervascular liver lesions, consistent with benign etiology.    Medical Consultants:   GI Other Consultants:  None  IAnti-Infectives:   None  Faye Ramsay, MD  [facility] Pager [phone]  If 7PM-7AM, please contact night-coverage [email or web address] Password [facility] 01/06/2015, 4:57 PM   LOS: 13 days   HPI/Subjective: No events overnight. Persistent nausea and poor oral intake.   Objective: Filed Vitals:   01/06/15 5366 01/06/15 0920 01/06/15 1155 01/06/15 1352  BP: 145/61  127/58 118/48  Pulse: 93  104 99  Temp: 99.1 F (37.3 C)   97.7 F (36.5 C)  TempSrc: Oral   Oral  Resp: 20   22  Height:      Weight: 65.9 kg (145 lb 4.5 oz)     SpO2: 100% 90%  95%    Intake/Output Summary (Last 24 hours) at 01/06/15 1657 Last data filed at 01/06/15 1300  Gross per 24 hour  Intake    480 ml  Output   2750 ml  Net  -2270 ml    Exam:   General:  Pt is alert, follows commands appropriately, not in acute distress  Cardiovascular: Regular rate and rhythm, S1/S2, no murmurs, no rubs, no gallops  Respiratory: Clear to auscultation bilaterally, no wheezing, bibasilar crackles   Abdomen: Soft, tender, non distended, bowel sounds present, no guarding  Extremities: +2 bilateral LE pitting edema, extending to knees, pulses DP and PT palpable bilaterally  Neuro: Grossly nonfocal  Data Reviewed: Basic Metabolic Panel:  Recent Labs Lab 01/01/15 0443 01/02/15 0518 01/03/15 0513 01/04/15 0514 01/05/15 0435  NA 133* 136 136 138 135  K 3.5 3.1* 3.9 4.1 3.9  CL 100 96 94* 93* 91*  CO2 24 31 33* 36* 36*  GLUCOSE 121* 120* 141* 141* 131*  BUN 19 16 15 15 14   CREATININE 0.74 0.73 0.75 0.79 0.83  CALCIUM 7.9* 7.8* 7.9* 8.1* 8.0*   Liver Function Tests:  Recent Labs Lab 01/01/15 0443  AST 17  ALT 18  ALKPHOS 182*  BILITOT 0.7  PROT 5.1*  ALBUMIN 2.1*    Recent Labs Lab 12/31/14 0459  LIPASE 19   CBC:  Recent Labs Lab 01/01/15 0443 01/02/15 0518 01/03/15 0513 01/04/15 0514 01/05/15 0435  WBC 34.9* 32.2* 32.1* 27.1* 23.8*  HGB 10.3*  9.7* 9.8* 9.6* 9.2*  HCT 29.4* 28.6* 29.5* 29.0* 27.7*  MCV 86.5 86.1 86.8 87.6 88.2  PLT 562* 638* 627* 705* 700*   Scheduled Meds: . furosemide  40 mg Intravenous Daily  . latanoprost  1 drop Both Eyes QHS  . levalbuterol  0.63 mg Nebulization BID  . lipase/protease/amylase  12,000 Units Oral TID WC  . metoprolol tartrate  50 mg Oral BID  . pantoprazole  40 mg Oral Daily   Continuous Infusions:

## 2015-01-06 NOTE — Progress Notes (Signed)
Subjective: Feeling better.  Minimal pain without PO intake.  Any PO intake tends to worsen the pain.  Objective: Vital signs in last 24 hours: Temp:  [97.7 F (36.5 C)-99.1 F (37.3 C)] 97.7 F (36.5 C) (01/26 1352) Pulse Rate:  [93-104] 99 (01/26 1352) Resp:  [18-22] 22 (01/26 1352) BP: (118-147)/(48-61) 118/48 mmHg (01/26 1352) SpO2:  [90 %-100 %] 95 % (01/26 1352) Weight:  [65.9 kg (145 lb 4.5 oz)] 65.9 kg (145 lb 4.5 oz) (01/26 0643) Last BM Date: 01/03/15  Intake/Output from previous day: 01/25 0701 - 01/26 0700 In: 720 [P.O.:720] Out: 2750 [Urine:2750] Intake/Output this shift: Total I/O In: 240 [P.O.:240] Out: 1400 [Urine:1400]  General appearance: alert and no distress GI: minimal epigastric tenderness  Lab Results:  Recent Labs  01/04/15 0514 01/05/15 0435  WBC 27.1* 23.8*  HGB 9.6* 9.2*  HCT 29.0* 27.7*  PLT 705* 700*   BMET  Recent Labs  01/04/15 0514 01/05/15 0435  NA 138 135  K 4.1 3.9  CL 93* 91*  CO2 36* 36*  GLUCOSE 141* 131*  BUN 15 14  CREATININE 0.79 0.83  CALCIUM 8.1* 8.0*   LFT No results for input(s): PROT, ALBUMIN, AST, ALT, ALKPHOS, BILITOT, BILIDIR, IBILI in the last 72 hours. PT/INR No results for input(s): LABPROT, INR in the last 72 hours. Hepatitis Panel No results for input(s): HEPBSAG, HCVAB, HEPAIGM, HEPBIGM in the last 72 hours. C-Diff No results for input(s): CDIFFTOX in the last 72 hours. Fecal Lactopherrin No results for input(s): FECLLACTOFRN in the last 72 hours.  Studies/Results: Ct Abdomen Pelvis W Contrast  01/06/2015   CLINICAL DATA:  Upper abdominal pain. Nausea. Pancreatitis. Personal history of breast carcinoma and Hodgkin's lymphoma.  EXAM: CT ABDOMEN AND PELVIS WITH CONTRAST  TECHNIQUE: Multidetector CT imaging of the abdomen and pelvis was performed using the standard protocol following bolus administration of intravenous contrast.  CONTRAST:  184mL OMNIPAQUE IOHEXOL 300 MG/ML  SOLN  COMPARISON:   12/24/2014 and 01/14/2011  FINDINGS: Lower Chest: Moderate pleural effusions are seen bilaterally, left side greater than right, which are increased since previous study. Increased bibasilar atelectasis also demonstrated.  Hepatobiliary: 2 hypervascular lesions in the liver dome measuring up to 1.5 cm remains stable, consistent with benign etiologies such as hemangiomas or focal nodular hyperplasia. Heterogeneous enhancement is noted in the posterior right hepatic lobe, however no other definite liver masses are identified. Gallbladder is unremarkable.  Pancreas: Acute pancreatitis again demonstrated with an area pancreatic necrosis in the mid body. Several rim enhancing fluid collections are now seen which encompass the pancreatic body and tail and extend inferiorly in the anterior mesentery and omentum, consistent with pancreatic pseudocysts. The largest of these measures 6.8 x 8.8 cm.  Spleen:  Surgically absent  Adrenals/Urinary Tract: No masses identified. No evidence of hydronephrosis.  Stomach/Bowel/Peritoneum: No evidence of wall thickening, mass, or obstruction. Small amount of free fluid in pelvic cul-de-sac has decreased since previous study.  Vascular/Lymphatic: No pathologically enlarged lymph nodes identified. No other significant abnormality visualized.  Reproductive:  No mass or other significant abnormality identified.  Other:  None.  Musculoskeletal:  No suspicious bone lesions identified.  IMPRESSION: Acute pancreatitis with pancreatic necrosis involving the pancreatic body, and developing pancreatic pseudocysts, largest measuring 9 cm.  Increased bilateral pleural effusions and bibasilar atelectasis.  Stable small hypervascular liver lesions, consistent with benign etiology.   Electronically Signed   By: Earle Gell M.D.   On: 01/06/2015 10:17    Medications:  Scheduled: .  antiseptic oral rinse  7 mL Mouth Rinse q12n4p  . chlorhexidine  15 mL Mouth Rinse BID  . furosemide  40 mg  Intravenous Daily  . latanoprost  1 drop Both Eyes QHS  . levalbuterol  0.63 mg Nebulization BID  . lipase/protease/amylase  12,000 Units Oral TID WC  . metoprolol tartrate  50 mg Oral BID  . pantoprazole  40 mg Oral Daily   Continuous:   Assessment/Plan: 1) Severe acute pancreatitis. 2) Pancreatic cysts. 3) Pancreatic necrosis.   She is feeling better when she is not eating.  I brought up the subject of a naso-duodenal/jejunal feeding tube, but she is adamantly against the intervention.  Nutrition is to see her today and hopefully some balance can be identified for nutrition and minimizing pain exacerbation.  Plan: 1) Continue with pain control. 2) Await Nutrition consultation.   LOS: 13 days   Masayuki Sakai D 01/06/2015, 2:53 PM

## 2015-01-07 DIAGNOSIS — J9601 Acute respiratory failure with hypoxia: Secondary | ICD-10-CM

## 2015-01-07 DIAGNOSIS — C8190 Hodgkin lymphoma, unspecified, unspecified site: Secondary | ICD-10-CM

## 2015-01-07 LAB — CBC
HCT: 27.9 % — ABNORMAL LOW (ref 36.0–46.0)
Hemoglobin: 9.2 g/dL — ABNORMAL LOW (ref 12.0–15.0)
MCH: 29.1 pg (ref 26.0–34.0)
MCHC: 33 g/dL (ref 30.0–36.0)
MCV: 88.3 fL (ref 78.0–100.0)
Platelets: 793 10*3/uL — ABNORMAL HIGH (ref 150–400)
RBC: 3.16 MIL/uL — ABNORMAL LOW (ref 3.87–5.11)
RDW: 14.9 % (ref 11.5–15.5)
WBC: 18.4 10*3/uL — ABNORMAL HIGH (ref 4.0–10.5)

## 2015-01-07 LAB — BASIC METABOLIC PANEL
Anion gap: 10 (ref 5–15)
BUN: 16 mg/dL (ref 6–23)
CO2: 36 mmol/L — ABNORMAL HIGH (ref 19–32)
Calcium: 8.5 mg/dL (ref 8.4–10.5)
Chloride: 89 mmol/L — ABNORMAL LOW (ref 96–112)
Creatinine, Ser: 0.86 mg/dL (ref 0.50–1.10)
GFR calc Af Amer: 81 mL/min — ABNORMAL LOW (ref >90)
GFR calc non Af Amer: 70 mL/min — ABNORMAL LOW (ref >90)
Glucose, Bld: 139 mg/dL — ABNORMAL HIGH (ref 70–99)
Potassium: 4 mmol/L (ref 3.5–5.1)
Sodium: 135 mmol/L (ref 135–145)

## 2015-01-07 LAB — LIPASE, BLOOD: Lipase: 37 U/L (ref 11–59)

## 2015-01-07 MED ORDER — BOOST / RESOURCE BREEZE PO LIQD: 1.0000 | Freq: Three times a day (TID) | ORAL | Status: DC

## 2015-01-07 MED ORDER — DOCUSATE SODIUM 100 MG PO CAPS: 100.0000 mg | ORAL_CAPSULE | Freq: Every evening | ORAL | Status: DC | PRN

## 2015-01-07 MED ORDER — PANCRELIPASE (LIP-PROT-AMYL) 12000-38000 UNITS PO CPEP: 12000.0000 [IU] | ORAL_CAPSULE | Freq: Three times a day (TID) | ORAL | Status: DC

## 2015-01-07 MED ORDER — OXYCODONE HCL 5 MG PO TABS: 5.0000 mg | ORAL_TABLET | ORAL | Status: DC | PRN

## 2015-01-07 MED ORDER — PROMETHAZINE HCL 25 MG PO TABS: 25.0000 mg | ORAL_TABLET | Freq: Four times a day (QID) | ORAL | Status: DC | PRN

## 2015-01-07 NOTE — Discharge Summary (Addendum)
Physician Discharge Summary  CRISTOL ENGDAHL OFB:[id number] DOB: 1950-12-31 DOA: 12/24/2014  PCP: Tommy Medal, MD  Admit date: 12/24/2014 Discharge date: 01/07/2015  Time spent: 45 minutes  Recommendations for Outpatient Follow-up:  1. Repeat CT - f/u per GI   Discharge Condition: stable Diet recommendation: low fat diet  Discharge Diagnoses:  Principal Problem:   Acute pancreatitis Active Problems:   HTN (hypertension)   Leukocytosis   AKI (acute kidney injury)   Hyponatremia   Abdominal pain, epigastric   Diarrhea   Acute respiratory failure with hypoxia   Hodgkin lymphoma   History of present illness:  Patricia [name] is a 64 y.o. female who has a past medical history of Hodgkin's lymphoma, Breast cancer and Atrial flutter. Presented with epigastric pain since 3 pm along with nausea an vomiting. Pain was severe and she came to ER.  CT abdomen showed pancreatitis with devascularization at the body of the pancreas. Patient denies any alcohol abuse. No change in her recent medication. She still has gallbladder. Patient had remote history of non-Hodgkin's lymphoma status post splenectomy. Patient still endorses some abdominal pain.  Hospital Course:  Acute pancreatitis  - lipase has been WNL > 72 hours, pt reports ongoing poor appetite with nausea but no vomiting - repeat CT abd with persistent acute pancreatitis with pseudocysts and necrosis - appreciate GI team following - pt refusing feeding tube placement and is actually now finishing her meals and her resource therefore, no need for a feeding tube at this time - no longer needed PRN antiemetics but still requiring low dose PRN analgesics - continue pancrease supplementation  - f/u with Dr Sheela Stack need repeat CT in a few wks which I have explained to the patient-  patient will call the office herself  Leukocytosis  - secondary to the above, WBC is trending down (34.9>> 18.4 today)   Constipation - place on  bowel regimen with resolution of constipation  HTN - reasonable inpatient control  - continue metoprolol per home medical regimen   Acute hypoxic respiratory failure - clinically consistent with pulmonary vascular congestion, pleural effusions, confirmed with CXR and elevated BNP- was placed on Lasix with good results - oxygen saturations now normalized and at target range on RA at rest and with exertion - pt's weight at baseline is 135 lbs, here as high as 154 lbs, now trending down: 139 lbs this AM - BNP is trending down  - will d/c Lasix today  Glaucoma - continue xalatan  Hypokalemia - supplemented and WNL 1/25  GERD - continue PPI  Hyperglycemia - stable CBG's  Acute kidney injury - pre renal in etiology - Cr has been stable and WNL   Hyponatremia - also pre renal  - remains stable and is WNL  Hodgkin lymphoma - outpatient follow up  Consultations:  GI- Dr Adriana Mccallum  Discharge Exam: Filed Weights   01/06/15 0643 01/07/15 0559 01/07/15 1124  Weight: 65.9 kg (145 lb 4.5 oz) 68.8 kg (151 lb 10.8 oz) 63.277 kg (139 lb 8 oz)   Filed Vitals:   01/07/15 1158  BP: 132/53  Pulse: 102  Temp:   Resp:     General: AAO x 3, no distress Cardiovascular: RRR, no murmurs  Respiratory: clear to auscultation bilaterally GI: soft, non-tender, non-distended, bowel sound positive  Discharge Instructions You were cared for by a hospitalist during your hospital stay. If you have any questions about your discharge medications or the care you received while you were in the hospital  after you are discharged, you can call the unit and asked to speak with the hospitalist on call if the hospitalist that took care of you is not available. Once you are discharged, your primary care physician will handle any further medical issues. Please note that NO REFILLS for any discharge medications will be authorized once you are discharged, as it is imperative that you return to your  primary care physician (or establish a relationship with a primary care physician if you do not have one) for your aftercare needs so that they can reassess your need for medications and monitor your lab values.     Medication List    STOP taking these medications        Chromium Picolinate 800 MCG Tabs     Fish Oil 1200 MG Caps      TAKE these medications        cetirizine 10 MG tablet  Commonly known as:  ZYRTEC  Take 10 mg by mouth daily as needed for allergies.     cholecalciferol 1000 UNITS tablet  Commonly known as:  VITAMIN D  Take 2,000 Units by mouth daily.     dexlansoprazole 60 MG capsule  Commonly known as:  DEXILANT  Take 60 mg by mouth daily.     docusate sodium 100 MG capsule  Commonly known as:  COLACE  Take 1 capsule (100 mg total) by mouth at bedtime as needed for mild constipation.     ergocalciferol 50000 UNITS capsule  Commonly known as:  VITAMIN D2  Take 50,000 Units by mouth every 30 (thirty) days. Take on the 1st of every month.     feeding supplement (RESOURCE BREEZE) Liqd  Take 1 Container by mouth 3 (three) times daily between meals.     latanoprost 0.005 % ophthalmic solution  Commonly known as:  XALATAN  Place 1 drop into both eyes at bedtime.     lipase/protease/amylase 12000 UNITS Cpep capsule  Commonly known as:  CREON  Take 1 capsule (12,000 Units total) by mouth 3 (three) times daily with meals.     magnesium gluconate 500 MG tablet  Commonly known as:  MAGONATE  Take 500 mg by mouth daily.     metoprolol 50 MG tablet  Commonly known as:  LOPRESSOR  TAKE 1 TABLET BY MOUTH TWICE A DAY     multivitamin with minerals Tabs tablet  Take 1 tablet by mouth daily.     oxyCODONE 5 MG immediate release tablet  Commonly known as:  Oxy IR/ROXICODONE  Take 1 tablet (5 mg total) by mouth every 4 (four) hours as needed for severe pain.     promethazine 25 MG tablet  Commonly known as:  PHENERGAN  Take 1 tablet (25 mg total) by mouth  every 6 (six) hours as needed for nausea or vomiting.       Allergies  Allergen Reactions  . Penicillins Anaphylaxis, Hives and Swelling  . Sulfa Antibiotics Hives and Rash      The results of significant diagnostics from this hospitalization (including imaging, microbiology, ancillary and laboratory) are listed below for reference.    Significant Diagnostic Studies: Dg Chest 2 View  01/02/2015   CLINICAL DATA:  Shortness of breath. History of lymphoma and breast cancer.  EXAM: CHEST  2 VIEW  COMPARISON:  Chest radiograph 12/31/2014  FINDINGS: Stable cardiac and mediastinal contours. Unchanged moderate bilateral pleural effusions with underlying consolidative pulmonary opacities. No definite pneumothorax. Upper abdominal surgical clips. Mid thoracic spine degenerative  changes.  IMPRESSION: Stable bilateral pleural effusions and underlying consolidative opacities.   Electronically Signed   By: Lovey Newcomer M.D.   On: 01/02/2015 08:20   Dg Chest 2 View  12/31/2014   CLINICAL DATA:  64 year old with dyspnea. History of hypertension. History of breast cancer and left mastectomy. History of Hodgkin's disease.  EXAM: CHEST  2 VIEW  COMPARISON:  12/26/2014  FINDINGS: Patient had a left mastectomy. There are bilateral pleural effusions, left side greater than right. No significant pulmonary edema. The heart is obscured by the pleural fluid. Trachea is midline. Surgical clips in the abdomen. Focal density in the left abdomen is probably related to bowel.  IMPRESSION: Bilateral pleural effusions, left side greater than right. Left pleural effusion is moderate in size.   Electronically Signed   By: Markus Daft M.D.   On: 12/31/2014 17:07   Dg Chest 2 View  12/26/2014   CLINICAL DATA:  Shortness of breath, low-grade fever  EXAM: CHEST  2 VIEW  COMPARISON:  10/28/2013  FINDINGS: Cardiomediastinal silhouette is stable. There is bilateral small pleural effusion left greater than right with bilateral basilar  atelectasis or infiltrate. No pulmonary edema. Atherosclerotic calcifications of thoracic aorta again noted. Surgical clips in left mid abdomen. Surgical clips midline lower abdomen.  IMPRESSION: No pulmonary edema. Bilateral small pleural effusion left greater than right with bilateral basilar atelectasis or infiltrate.   Electronically Signed   By: Lahoma Crocker M.D.   On: 12/26/2014 11:21   US Abdomen Complete  12/25/2014   CLINICAL DATA:  Subsequent evaluation of pancreatitis patient history of splenectomy breast cancer lymphoma  EXAM: ULTRASOUND ABDOMEN COMPLETE  COMPARISON:  Multiple prior studies  FINDINGS: Gallbladder: No gallstones or wall thickening visualized. No sonographic Murphy sign noted.  Common bile duct: Diameter: 6 mm  Liver: No focal lesion identified. Within normal limits in parenchymal echogenicity.  IVC: No abnormality visualized.  Pancreas: Hypoechoic and thickened to an antero posterior diameter of 3.5 cm. No pseudocyst identified.  Spleen: Surgically absent  Right Kidney: Length: 9.6 cm. Echogenicity within normal limits. No mass or hydronephrosis visualized.  Left Kidney: Length: 9.4 cm. Echogenicity within normal limits. No mass or hydronephrosis visualized.  Abdominal aorta: No aneurysm visualized. Distal abdominal aorta is obscured by bowel gas.  Other findings: Small right pleural effusion. Small volume of ascites.  IMPRESSION: Findings consistent with pancreatitis. Findings are felt to be similar to those on a CT scan performed 12/24/2014.   Electronically Signed   By: Skipper Cliche M.D.   On: 12/25/2014 09:30   Ct Abdomen Pelvis W Contrast  01/06/2015   CLINICAL DATA:  Upper abdominal pain. Nausea. Pancreatitis. Personal history of breast carcinoma and Hodgkin's lymphoma.  EXAM: CT ABDOMEN AND PELVIS WITH CONTRAST  TECHNIQUE: Multidetector CT imaging of the abdomen and pelvis was performed using the standard protocol following bolus administration of intravenous contrast.   CONTRAST:  170mL OMNIPAQUE IOHEXOL 300 MG/ML  SOLN  COMPARISON:  12/24/2014 and 01/14/2011  FINDINGS: Lower Chest: Moderate pleural effusions are seen bilaterally, left side greater than right, which are increased since previous study. Increased bibasilar atelectasis also demonstrated.  Hepatobiliary: 2 hypervascular lesions in the liver dome measuring up to 1.5 cm remains stable, consistent with benign etiologies such as hemangiomas or focal nodular hyperplasia. Heterogeneous enhancement is noted in the posterior right hepatic lobe, however no other definite liver masses are identified. Gallbladder is unremarkable.  Pancreas: Acute pancreatitis again demonstrated with an area pancreatic necrosis in the mid  body. Several rim enhancing fluid collections are now seen which encompass the pancreatic body and tail and extend inferiorly in the anterior mesentery and omentum, consistent with pancreatic pseudocysts. The largest of these measures 6.8 x 8.8 cm.  Spleen:  Surgically absent  Adrenals/Urinary Tract: No masses identified. No evidence of hydronephrosis.  Stomach/Bowel/Peritoneum: No evidence of wall thickening, mass, or obstruction. Small amount of free fluid in pelvic cul-de-sac has decreased since previous study.  Vascular/Lymphatic: No pathologically enlarged lymph nodes identified. No other significant abnormality visualized.  Reproductive:  No mass or other significant abnormality identified.  Other:  None.  Musculoskeletal:  No suspicious bone lesions identified.  IMPRESSION: Acute pancreatitis with pancreatic necrosis involving the pancreatic body, and developing pancreatic pseudocysts, largest measuring 9 cm.  Increased bilateral pleural effusions and bibasilar atelectasis.  Stable small hypervascular liver lesions, consistent with benign etiology.   Electronically Signed   By: Earle Gell M.D.   On: 01/06/2015 10:17   Ct Abdomen Pelvis W Contrast  12/24/2014   CLINICAL DATA:  Acute onset of abdominal  cramping, nausea and vomiting. Initial encounter.  EXAM: CT ABDOMEN AND PELVIS WITH CONTRAST  TECHNIQUE: Multidetector CT imaging of the abdomen and pelvis was performed using the standard protocol following bolus administration of intravenous contrast.  CONTRAST:  166mL OMNIPAQUE IOHEXOL 300 MG/ML  SOLN  COMPARISON:  CT of the abdomen and pelvis from 01/14/2011  FINDINGS: A small left pleural effusion is noted, with associated atelectasis. The patient is status post left-sided mastectomy. Calcification is noted at the mitral valve.  There is diffuse soft tissue inflammation and fluid about the entirety of the pancreas, with apparent mild devascularization involving the body of the pancreas. No definite pseudocyst formation is seen. A moderate amount of surrounding free fluid tracks anterior to Gerota's fascia bilaterally, extends about the liver, and extends inferiorly along the paracolic gutters into the pelvis.  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 The patient is status post splenectomy. The gallbladder is within normal limits, though difficult to fully assess given surrounding fluid.  Prominent postoperative change is seen about the infrarenal abdominal aorta and right upper quadrant.  Tiny bilateral renal cysts are suggested. Minimal nonspecific left-sided perinephric stranding is seen. There is no evidence of hydronephrosis. No renal or ureteral stones are seen.  No free fluid is identified. The small bowel is unremarkable in appearance. The stomach is within normal limits. No acute vascular abnormalities are seen. Mild calcification is seen at the origins of the celiac trunk and bilateral renal arteries.  The appendix is not definitely seen; there is no evidence of appendicitis. The colon is grossly unremarkable in appearance.  The bladder is mildly distended and grossly unremarkable. The uterus is grossly unremarkable. Postoperative change is seen at  both adnexa. No suspicious adnexal masses are seen. No inguinal lymphadenopathy is seen.  No acute osseous abnormalities are identified.  IMPRESSION: 1. Significant acute pancreatitis, with apparent mild devascularization at the body of the pancreas. No definite pseudocyst formation seen. Moderate amount of surrounding free fluid tracks anterior to Gerota's fascia bilaterally, extends about the liver, and extends inferiorly along the paracolic gutters into the pelvis. 2. Small left pleural effusion, with associated atelectasis. 3. 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 4. Tiny bilateral renal cysts suggested. 5. Mild calcification at the origins of the celiac trunk and bilateral renal arteries. 6. Calcification incidentally noted at the mitral valve.  These results were called  by telephone at the time of interpretation on 12/24/2014 at 11:55 pm to Dr. Quintella Reichert, who verbally acknowledged these results.   Electronically Signed   By: Garald Balding M.D.   On: 12/24/2014 23:56    Microbiology: No results found for this or any previous visit (from the past 240 hour(s)).   Labs: Basic Metabolic Panel:  Recent Labs Lab 01/02/15 0518 01/03/15 0513 01/04/15 0514 01/05/15 0435 01/07/15 0448  NA 136 136 138 135 135  K 3.1* 3.9 4.1 3.9 4.0  CL 96 94* 93* 91* 89*  CO2 31 33* 36* 36* 36*  GLUCOSE 120* 141* 141* 131* 139*  BUN 16 15 15 14 16   CREATININE 0.73 0.75 0.79 0.83 0.86  CALCIUM 7.8* 7.9* 8.1* 8.0* 8.5   Liver Function Tests:  Recent Labs Lab 01/01/15 0443  AST 17  ALT 18  ALKPHOS 182*  BILITOT 0.7  PROT 5.1*  ALBUMIN 2.1*    Recent Labs Lab 01/07/15 0448  LIPASE 37   No results for input(s): AMMONIA in the last 168 hours. CBC:  Recent Labs Lab 01/02/15 0518 01/03/15 0513 01/04/15 0514 01/05/15 0435 01/07/15 0448  WBC 32.2* 32.1* 27.1* 23.8* 18.4*  HGB 9.7* 9.8* 9.6* 9.2* 9.2*  HCT 28.6* 29.5* 29.0* 27.7* 27.9*  MCV  86.1 86.8 87.6 88.2 88.3  PLT 638* 627* 705* 700* 793*   Cardiac Enzymes: No results for input(s): CKTOTAL, CKMB, CKMBINDEX, TROPONINI in the last 168 hours. BNP: BNP (last 3 results) No results for input(s): PROBNP in the last 8760 hours. CBG: No results for input(s): GLUCAP in the last 168 hours.     SignedDebbe Odea, MD [facility] 01/07/2015, 12:02 PM

## 2015-01-07 NOTE — Progress Notes (Signed)
Utilization review completed.  

## 2015-01-20 ENCOUNTER — Encounter: Payer: Self-pay | Admitting: Cardiology

## 2015-03-24 ENCOUNTER — Other Ambulatory Visit: Payer: Self-pay | Admitting: Gastroenterology

## 2015-03-24 DIAGNOSIS — R9389 Abnormal findings on diagnostic imaging of other specified body structures: Secondary | ICD-10-CM

## 2015-03-24 DIAGNOSIS — K863 Pseudocyst of pancreas: Secondary | ICD-10-CM

## 2015-03-27 ENCOUNTER — Encounter (HOSPITAL_COMMUNITY): Payer: Self-pay

## 2015-03-27 ENCOUNTER — Ambulatory Visit (HOSPITAL_COMMUNITY)
Admission: RE | Admit: 2015-03-27 | Discharge: 2015-03-27 | Disposition: A | Discharge status: Home / Self Care | Payer: BC Managed Care – PPO | Source: Ambulatory Visit | Attending: Gastroenterology | Admitting: Gastroenterology

## 2015-03-27 DIAGNOSIS — C8190 Hodgkin lymphoma, unspecified, unspecified site: Secondary | ICD-10-CM | POA: Insufficient documentation

## 2015-03-27 DIAGNOSIS — K863 Pseudocyst of pancreas: Secondary | ICD-10-CM | POA: Diagnosis present

## 2015-03-27 DIAGNOSIS — R9389 Abnormal findings on diagnostic imaging of other specified body structures: Secondary | ICD-10-CM

## 2015-03-27 DIAGNOSIS — Z853 Personal history of malignant neoplasm of breast: Secondary | ICD-10-CM | POA: Diagnosis not present

## 2015-03-27 DIAGNOSIS — R938 Abnormal findings on diagnostic imaging of other specified body structures: Secondary | ICD-10-CM | POA: Diagnosis not present

## 2015-03-27 MED ORDER — IOHEXOL 300 MG/ML  SOLN
100.0000 mL | Freq: Once | INTRAMUSCULAR | Status: AC | PRN
  Administered 2015-03-27: 100 mL via INTRAVENOUS

## 2015-04-01 ENCOUNTER — Other Ambulatory Visit: Payer: Self-pay | Admitting: *Deleted

## 2015-04-01 MED ORDER — METOPROLOL TARTRATE 50 MG PO TABS: ORAL_TABLET | ORAL | Status: DC

## 2015-05-05 ENCOUNTER — Other Ambulatory Visit: Payer: Self-pay | Admitting: Cardiology

## 2015-05-29 ENCOUNTER — Telehealth: Payer: Self-pay | Admitting: Cardiology

## 2015-05-29 NOTE — Telephone Encounter (Signed)
Closed encounter °

## 2015-07-10 ENCOUNTER — Other Ambulatory Visit: Payer: Self-pay | Admitting: Neurosurgery

## 2015-07-10 DIAGNOSIS — M4712 Other spondylosis with myelopathy, cervical region: Secondary | ICD-10-CM

## 2015-07-13 ENCOUNTER — Other Ambulatory Visit: Payer: Self-pay | Admitting: Internal Medicine

## 2015-07-16 ENCOUNTER — Ambulatory Visit
Admission: RE | Admit: 2015-07-16 | Discharge: 2015-07-16 | Disposition: A | Discharge status: Home / Self Care | Payer: BC Managed Care – PPO | Source: Ambulatory Visit | Attending: Neurosurgery | Admitting: Neurosurgery

## 2015-07-16 ENCOUNTER — Other Ambulatory Visit: Payer: Self-pay | Admitting: Internal Medicine

## 2015-07-16 DIAGNOSIS — M4712 Other spondylosis with myelopathy, cervical region: Secondary | ICD-10-CM

## 2015-07-16 MED ORDER — GADOBENATE DIMEGLUMINE 529 MG/ML IV SOLN
10.0000 mL | Freq: Once | INTRAVENOUS | Status: AC | PRN
  Administered 2015-07-16: 10 mL via INTRAVENOUS

## 2015-08-06 ENCOUNTER — Encounter (HOSPITAL_COMMUNITY): Payer: Self-pay | Admitting: Emergency Medicine

## 2015-08-06 ENCOUNTER — Emergency Department (HOSPITAL_COMMUNITY)
Admission: EM | Admit: 2015-08-06 | Discharge: 2015-08-06 | Disposition: A | Discharge status: Home / Self Care | Payer: BC Managed Care – PPO | Source: Home / Self Care | Attending: Family Medicine | Admitting: Family Medicine

## 2015-08-06 ENCOUNTER — Emergency Department (INDEPENDENT_AMBULATORY_CARE_PROVIDER_SITE_OTHER): Payer: BC Managed Care – PPO

## 2015-08-06 DIAGNOSIS — J189 Pneumonia, unspecified organism: Secondary | ICD-10-CM

## 2015-08-06 MED ORDER — LEVOFLOXACIN 500 MG PO TABS: 500.0000 mg | ORAL_TABLET | Freq: Every day | ORAL | Status: DC

## 2015-08-06 MED ORDER — ACETAMINOPHEN 325 MG PO TABS
ORAL_TABLET | ORAL | Status: AC
  Filled 2015-08-06: qty 2

## 2015-08-06 MED ORDER — ALBUTEROL SULFATE (5 MG/ML) 0.5% IN NEBU: 2.5000 mg | INHALATION_SOLUTION | Freq: Four times a day (QID) | RESPIRATORY_TRACT | Status: DC | PRN

## 2015-08-06 MED ORDER — FLUCONAZOLE 150 MG PO TABS: 150.0000 mg | ORAL_TABLET | Freq: Every day | ORAL | Status: DC

## 2015-08-06 MED ORDER — ACETAMINOPHEN 325 MG PO TABS
650.0000 mg | ORAL_TABLET | Freq: Once | ORAL | Status: AC
  Administered 2015-08-06: 650 mg via ORAL

## 2015-08-06 NOTE — ED Provider Notes (Signed)
CSN: [id number]     Arrival date & time 08/06/15  1737 History   None    Chief Complaint  Patient presents with  . Pneumonia   (Consider location/radiation/quality/duration/timing/severity/associated sxs/prior Treatment) HPI  Fevers. Started 4 days ago. As high as 102.6. Associated with cough. Intermittently productive. Patient status in the recliner for the last several nights. Associate with shortness of breath. Denies any chest pain, palpitations, nausea, vomiting, diarrhea, especially rash. Denies sick contacts. History of pneumonia in the past patient states that this is what feels like. Patient is been treated for breast cancer as well as for Hodgkin's lymphoma but has been cancer free for several years. Patient has used albuterol with intermittent relief of symptoms. Symptoms are getting worse.  Past Medical History  Diagnosis Date  . Ulcer   . Atrial flutter     Ablated 1998 Dr. Caryl Comes  . Hodgkin's lymphoma     Treated with radiation and chemo  . Breast cancer    Past Surgical History  Procedure Laterality Date  . Laparotomy    . Splenectomy  1974    hodgkins disease  . Tonsillectomy and adenoidectomy    . Tee without cardioversion  06/12/2012    Procedure: TRANSESOPHAGEAL ECHOCARDIOGRAM (TEE);  Surgeon: Larey [name], MD;  Location[facility] ENDOSCOPY;  Service: Cardiovascular;  Laterality: N/A;  . Cataract extraction, bilateral    . Mastectomy  2005    Left-axillary  . Colonoscopy    . Cardiac electrophysiology mapping and ablation  1999  . Orif ankle fracture Left 03/21/2014    Procedure: LEFT ANKLE FRACTURE OPEN TREATMENT BILMALLEOLAR INCLUDES INTERNAL FIXATION ;  Surgeon: Renette Butters, MD;  Location: [place];  Service: Orthopedics;  Laterality: Left;   Family History  Problem Relation Age of Onset  . Rheumatic fever Father     Died suddenly age 70  . Diabetes Father    Social History  Substance Use Topics  . Smoking status: Never Smoker   .  Smokeless tobacco: None  . Alcohol Use: No   OB History    No data available     Review of Systems Per HPI with all other pertinent systems negative.   Allergies  Penicillins; Azithromycin; and Sulfa antibiotics  Home Medications   Prior to Admission medications   Medication Sig Start Date End Date Taking? Authorizing Provider  albuterol (PROVENTIL) (5 MG/ML) 0.5% nebulizer solution Take 0.5 mLs (2.5 mg total) by nebulization every 6 (six) hours as needed for wheezing or shortness of breath. 08/06/15   Waldemar Dickens, MD  cetirizine (ZYRTEC) 10 MG tablet Take 10 mg by mouth daily as needed for allergies.    Historical Provider, MD  cholecalciferol (VITAMIN D) 1000 UNITS tablet Take 2,000 Units by mouth daily.     Historical Provider, MD  dexlansoprazole (DEXILANT) 60 MG capsule Take 60 mg by mouth daily.    Historical Provider, MD  docusate sodium (COLACE) 100 MG capsule Take 1 capsule (100 mg total) by mouth at bedtime as needed for mild constipation. 01/07/15   Debbe Odea, MD  ergocalciferol (VITAMIN D2) 50000 UNITS capsule Take 50,000 Units by mouth every 30 (thirty) days. Take on the 1st of every month.    Historical Provider, MD  feeding supplement, RESOURCE BREEZE, (RESOURCE BREEZE) LIQD Take 1 Container by mouth 3 (three) times daily between meals. 01/07/15   Debbe Odea, MD  fluconazole (DIFLUCAN) 150 MG tablet Take 1 tablet (150 mg total) by mouth daily. Repeat dose  in 3 days 08/06/15   Waldemar Dickens, MD  latanoprost (XALATAN) 0.005 % ophthalmic solution Place 1 drop into both eyes at bedtime.  03/11/14   Historical Provider, MD  levofloxacin (LEVAQUIN) 500 MG tablet Take 1 tablet (500 mg total) by mouth daily. 08/06/15   Waldemar Dickens, MD  lipase/protease/amylase (CREON) 12000 UNITS CPEP capsule Take 1 capsule (12,000 Units total) by mouth 3 (three) times daily with meals. 01/07/15   Debbe Odea, MD  magnesium gluconate (MAGONATE) 500 MG tablet Take 500 mg by mouth daily.     Historical Provider, MD  metoprolol (LOPRESSOR) 50 MG tablet TAKE 1 TABLET BY MOUTH TWICE A DAY 05/05/15   Minus Breeding, MD  Multiple Vitamin (MULTIVITAMIN WITH MINERALS) TABS Take 1 tablet by mouth daily.    Historical Provider, MD  oxyCODONE (OXY IR/ROXICODONE) 5 MG immediate release tablet Take 1 tablet (5 mg total) by mouth every 4 (four) hours as needed for severe pain. 01/07/15   Debbe Odea, MD  promethazine (PHENERGAN) 25 MG tablet Take 1 tablet (25 mg total) by mouth every 6 (six) hours as needed for nausea or vomiting. 01/07/15   Debbe Odea, MD   Meds Ordered and Administered this Visit   Medications  acetaminophen (TYLENOL) tablet 650 mg (650 mg Oral Given 08/06/15 1755)    BP 173/82 mmHg  Pulse 140  Temp(Src) 102.5 F (39.2 C) (Oral)  Resp 20  SpO2 91% No data found.   Physical Exam  Physical Exam  Constitutional: oriented to person, place, and time. appears well-developed and well-nourished. No distress.  HENT:  Head: Normocephalic and atraumatic.  Eyes: EOMI. PERRL.  Neck: Normal range of motion.  Cardiovascular: RRR, no m/r/g, 2+ distal pulses,  Pulmonary/Chest: Few rhonchi in right lung, no wheezes, normal effort,.  Abdominal: Soft. Bowel sounds are normal. NonTTP, no distension.  Musculoskeletal: Normal range of motion. Non ttp, no effusion.  Neurological: alert and oriented to person, place, and time.  Skin: Skin is warm. No rash noted. non diaphoretic.  Psychiatric: normal mood and affect. behavior is normal. Judgment and thought content normal.    ED Course  Procedures (including critical care time)  Labs Review Labs Reviewed - No data to display  Imaging Review Dg Chest 2 View  08/06/2015   CLINICAL DATA:  Cough, wheezing, fever, got back from [place] on Sunday night, history Hodgkin's disease 1975-76, breast cancer 2006, asthma  EXAM: CHEST  2 VIEW  COMPARISON:  01/02/2015  FINDINGS: Upper normal heart size.  Atherosclerotic calcification aorta.   Mediastinal contours and pulmonary vascularity normal.  Emphysematous changes with bibasilar effusions and atelectasis greater on LEFT, both sides improved since previous exam.  No new infiltrate or pneumothorax.  Bones demineralized.  IMPRESSION: COPD changes with decreased bibasilar effusions and atelectasis since 01/02/2015, remaining greater on LEFT.   Electronically Signed   By: Lavonia Dana M.D.   On: 08/06/2015 18:21          MDM   1. CAP (community acquired pneumonia)    Patient with likely community acquired pneumonia and compromised respiratory status and baseline. Patient with significant antibiotic allergies. Start Levaquin 500 milligrams daily 10 days. Diflucan if she developed yeast infection. Albuterol nebulizer ampules refilled. Desats noted in patient states that this is very common for her. Respiratory effort at baseline. Patient to follow-up with primary care doctor in 1-2 weeks or sooner in the ED if she worsens. Patient advised to have repeat plain films in 2-3 months. Tachycardia likely  from acute illness and current fever.    Waldemar Dickens, MD 08/06/15 [phone]

## 2015-08-06 NOTE — Discharge Instructions (Signed)
You likely have pneumonia. Please start the antibiotic taken for the full 10 days. Please use the albuterol daily additional respiratory support area and is here to the emergency room if you get worse. Please use Tylenol and ibuprofen for pain and fever. Please use the Diflucan if you develop yeast infection.

## 2015-08-06 NOTE — ED Notes (Signed)
C/o pneumonia

## 2015-08-10 ENCOUNTER — Ambulatory Visit: Payer: BC Managed Care – PPO | Admitting: Cardiology

## 2015-08-20 ENCOUNTER — Ambulatory Visit (INDEPENDENT_AMBULATORY_CARE_PROVIDER_SITE_OTHER): Payer: BC Managed Care – PPO | Admitting: Cardiology

## 2015-08-20 ENCOUNTER — Encounter: Payer: Self-pay | Admitting: Cardiology

## 2015-08-20 VITALS — BP 132/56 | HR 97 | Ht 66.0 in | Wt 131.2 lb

## 2015-08-20 DIAGNOSIS — I340 Nonrheumatic mitral (valve) insufficiency: Secondary | ICD-10-CM | POA: Diagnosis not present

## 2015-08-20 NOTE — Progress Notes (Signed)
HPI The patient presents for evaluation of mitral regurgitation. Transthoracic echo demonstrated moderate to severe mitral regurgitation with mitral valve prolapse. However, followup TEE suggested the MR to be moderate. She also had a history of flutter ablated.  Since I last saw her she has had pancreatitis and actually is currently being treated for pneumonia. She otherwise has done well. 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  The patient denies any new symptoms such as chest discomfort, neck or arm discomfort. There has been no new shortness of breath, PND or orthopnea. There have been no reported palpitations, presyncope or syncope.   Allergies  Allergen Reactions  . Penicillins Anaphylaxis, Hives and Swelling  . Azithromycin Other (See Comments)    pancreatitis  . Sulfa Antibiotics Hives and Rash    Current Outpatient Prescriptions  Medication Sig Dispense Refill  . albuterol (PROVENTIL) (5 MG/ML) 0.5% nebulizer solution Take 0.5 mLs (2.5 mg total) by nebulization every 6 (six) hours as needed for wheezing or shortness of breath. 20 mL 12  . cetirizine (ZYRTEC) 10 MG tablet Take 10 mg by mouth daily as needed for allergies.    . cholecalciferol (VITAMIN D) 1000 UNITS tablet Take 2,000 Units by mouth daily.     Marland Kitchen docusate sodium (COLACE) 100 MG capsule Take 1 capsule (100 mg total) by mouth at bedtime as needed for mild constipation. 10 capsule 0  . ergocalciferol (VITAMIN D2) 50000 UNITS capsule Take 50,000 Units by mouth every 30 (thirty) days. Take on the 1st of every month.    . esomeprazole (NEXIUM) 40 MG capsule Take 40 mg by mouth every morning.  12  . FLOVENT HFA 110 MCG/ACT inhaler Take 2 puffs by mouth 2 (two) times daily.  11  . latanoprost (XALATAN) 0.005 % ophthalmic solution Place 1 drop into both eyes at bedtime.     . lipase/protease/amylase (CREON) 12000 UNITS CPEP capsule Take 1 capsule (12,000 Units total) by mouth 3 (three) times daily with meals. 270 capsule 1  .  metoprolol (LOPRESSOR) 50 MG tablet TAKE 1 TABLET BY MOUTH TWICE A DAY 60 tablet 0  . Multiple Vitamin (MULTIVITAMIN WITH MINERALS) TABS Take 1 tablet by mouth daily.     No current facility-administered medications for this visit.    Past Medical History  Diagnosis Date  . Ulcer   . Atrial flutter     Ablated 1998 Dr. Caryl Comes  . Hodgkin's lymphoma     Treated with radiation and chemo  . Breast cancer     Past Surgical History  Procedure Laterality Date  . Laparotomy    . Splenectomy  1974    hodgkins disease  . Tonsillectomy and adenoidectomy    . Tee without cardioversion  06/12/2012    Procedure: TRANSESOPHAGEAL ECHOCARDIOGRAM (TEE);  Surgeon: Larey [name], MD;  Location: [facility] ENDOSCOPY;  Service: Cardiovascular;  Laterality: N/A;  . Cataract extraction, bilateral    . Mastectomy  2005    Left-axillary  . Colonoscopy    . Cardiac electrophysiology mapping and ablation  1999  . Orif ankle fracture Left 03/21/2014    Procedure: LEFT ANKLE FRACTURE OPEN TREATMENT BILMALLEOLAR INCLUDES INTERNAL FIXATION ;  Surgeon: Renette Butters, MD;  Location: [facility];  Service: Orthopedics;  Laterality: Left;     ROS:  As stated in the HPI and negative for all other systems.   PHYSICAL EXAM BP 132/56 mmHg  Pulse 97  Ht 5\' 6"  (1.676 m)  Wt 131 lb 3.2 oz (59.512 kg)  BMI 21.19 kg/m2 GEN:  No distress NECK:  No jugular venous distention, waveform within normal limits, carotid upstroke brisk and symmetric, no bruits, no thyromegaly LUNGS:  Clear to auscultation bilaterally BACK:  No CVA tenderness HEART:  S1 and S2 within normal limits, no S3, no S4, no clicks, no rubs, 2/6 systolic murmur heard best in the axilla and slightly posterior holosystolic, no diastolic murmurs ABD:  Positive bowel sounds normal in frequency in pitch, no bruits, no rebound, no guarding, t no midline pulsatile mass, no hepatomegaly . EXT:  2 plus pulses throughout, moderate edema, no cyanosis no  clubbing  EKG:  Sinus rhythm, rate 97, axis within normal limits, intervals within normal limits, no acute ST-T wave changes.  08/20/2015    ASSESSMENT AND PLAN  MR:  I we'll order a follow-up echocardiogram to assess this.    FLUTTER:  She has had occasional palpitations but no sustained tachyarrhythmias. No further evaluation or change in therapy is planned.

## 2015-08-20 NOTE — Patient Instructions (Signed)
Your physician wants you to follow-up in: 1 Year. You will receive a reminder letter in the mail two months in advance. If you don't receive a letter, please call our office to schedule the follow-up appointment.  Your physician has requested that you have an echocardiogram. Echocardiography is a painless test that uses sound waves to create images of your heart. It provides your doctor with information about the size and shape of your heart and how well your heart's chambers and valves are working. This procedure takes approximately one hour. There are no restrictions for this procedure.    

## 2015-08-31 ENCOUNTER — Ambulatory Visit (HOSPITAL_COMMUNITY): Payer: BC Managed Care – PPO | Attending: Cardiology

## 2015-08-31 ENCOUNTER — Other Ambulatory Visit: Payer: Self-pay

## 2015-08-31 DIAGNOSIS — I340 Nonrheumatic mitral (valve) insufficiency: Secondary | ICD-10-CM | POA: Diagnosis present

## 2015-08-31 DIAGNOSIS — I050 Rheumatic mitral stenosis: Secondary | ICD-10-CM | POA: Insufficient documentation

## 2015-08-31 DIAGNOSIS — I5189 Other ill-defined heart diseases: Secondary | ICD-10-CM | POA: Insufficient documentation

## 2015-08-31 DIAGNOSIS — I351 Nonrheumatic aortic (valve) insufficiency: Secondary | ICD-10-CM | POA: Insufficient documentation

## 2015-08-31 DIAGNOSIS — I071 Rheumatic tricuspid insufficiency: Secondary | ICD-10-CM | POA: Diagnosis not present

## 2015-08-31 DIAGNOSIS — I313 Pericardial effusion (noninflammatory): Secondary | ICD-10-CM | POA: Insufficient documentation

## 2015-09-08 ENCOUNTER — Other Ambulatory Visit: Payer: Self-pay

## 2015-09-08 MED ORDER — METOPROLOL TARTRATE 50 MG PO TABS: 50.0000 mg | ORAL_TABLET | Freq: Two times a day (BID) | ORAL | Status: DC

## 2015-09-08 NOTE — Telephone Encounter (Signed)
Minus Breeding, MD at 08/20/2015 9:01 AM  metoprolol (LOPRESSOR) 50 MG tabletTAKE 1 TABLET BY MOUTH TWICE A DAY Patient Instructions     Your physician wants you to follow-up in: 1 Year. You will receive a reminder letter in the mail two months in advance. If you don't receive a letter, please call our office to schedule the follow-up appointment.

## 2015-09-29 ENCOUNTER — Encounter: Payer: Self-pay | Admitting: Family Medicine

## 2015-10-02 ENCOUNTER — Ambulatory Visit: Payer: Self-pay | Admitting: Family Medicine

## 2015-10-08 ENCOUNTER — Encounter: Payer: Self-pay | Admitting: Family Medicine

## 2015-10-08 ENCOUNTER — Ambulatory Visit (INDEPENDENT_AMBULATORY_CARE_PROVIDER_SITE_OTHER): Payer: BC Managed Care – PPO | Admitting: Family Medicine

## 2015-10-08 VITALS — BP 128/60 | HR 84 | Temp 98.8°F | Resp 16 | Ht 66.0 in | Wt 132.0 lb

## 2015-10-08 DIAGNOSIS — M810 Age-related osteoporosis without current pathological fracture: Secondary | ICD-10-CM | POA: Insufficient documentation

## 2015-10-08 DIAGNOSIS — I340 Nonrheumatic mitral (valve) insufficiency: Secondary | ICD-10-CM

## 2015-10-08 DIAGNOSIS — Z8571 Personal history of Hodgkin lymphoma: Secondary | ICD-10-CM

## 2015-10-08 DIAGNOSIS — R7303 Prediabetes: Secondary | ICD-10-CM | POA: Insufficient documentation

## 2015-10-08 DIAGNOSIS — J019 Acute sinusitis, unspecified: Secondary | ICD-10-CM

## 2015-10-08 DIAGNOSIS — K8689 Other specified diseases of pancreas: Secondary | ICD-10-CM

## 2015-10-08 DIAGNOSIS — Z853 Personal history of malignant neoplasm of breast: Secondary | ICD-10-CM | POA: Diagnosis not present

## 2015-10-08 DIAGNOSIS — K859 Acute pancreatitis without necrosis or infection, unspecified: Secondary | ICD-10-CM | POA: Insufficient documentation

## 2015-10-08 MED ORDER — LEVOFLOXACIN 500 MG PO TABS: 500.0000 mg | ORAL_TABLET | Freq: Every day | ORAL | Status: DC

## 2015-10-08 NOTE — Progress Notes (Signed)
Subjective:    Patient ID: Patricia [name], female    DOB: 05-17-1951, 64 y.o.   MRN: [id number]  HPI Patient is a 64 y/o WF whom I have known my entire life through church.  She is here today to establish care.  She has a complicated past medical history. In the mid 70s she was diagnosed with Hodgkin's disease and underwent radiation therapy for treatment. She's been in remission since 1976. She also has a history of left-sided breast cancer status post mastectomy and chemotherapy in 2005. She also has a history of mitral valve regurgitation with atrial flutter status post ablation. She still gets occasional PVCs and for that reason she takes metoprolol along with a baby aspirin. She also has a history of osteoporosis. Last bone density was in 2015. She is taking Fosamax for 5 years. Recently her doctor stopped the Fosamax and switched her to recline. She is due for a repeat request injection this year and a repeat bone density next year. She also has a history of prediabetes managed with diet. In January, she developed pancreatitis. Presently they believe it was likely due to a Z-Pak. She is subsequently developed pancreatic insufficiency since that time for which she takes pancreatic enzyme replacement therapy under the care of her gastroenterologist. Pneumovax 23 and Prevnar 13 are up-to-date. Her flu shot is up-to-date. Her colonoscopy is up-to-date. Her mammogram is scheduled for later this year. Her last pass was 2 years ago and is up-to-date. Past Medical History  Diagnosis Date  . Ulcer   . Atrial flutter (HCC)     Ablated 1998 Dr. Caryl Comes  . Hodgkin's lymphoma ([place])     Treated with radiation and chemo  . Breast cancer ([place])   . COPD (chronic obstructive pulmonary disease) ([place])   . Osteoporosis     lumbar verterbral fracture, left ankle fracture, dexa 2015  . Mitral regurgitation     pvc  . Pancreatitis   . Prediabetes    Past Surgical History  Procedure Laterality Date  .  Laparotomy    . Splenectomy  1974    hodgkins disease  . Tonsillectomy and adenoidectomy    . Tee without cardioversion  06/12/2012    Procedure: TRANSESOPHAGEAL ECHOCARDIOGRAM (TEE);  Surgeon: Larey [name], MD;  Location: [facility] ENDOSCOPY;  Service: Cardiovascular;  Laterality: N/A;  . Cataract extraction, bilateral    . Mastectomy  2005    Left-axillary  . Colonoscopy    . Cardiac electrophysiology mapping and ablation  1999  . Orif ankle fracture Left 03/21/2014    Procedure: LEFT ANKLE FRACTURE OPEN TREATMENT BILMALLEOLAR INCLUDES INTERNAL FIXATION ;  Surgeon: Renette Butters, MD;  Location: [place];  Service: Orthopedics;  Laterality: Left;   Current Outpatient Prescriptions on File Prior to Visit  Medication Sig Dispense Refill  . albuterol (PROVENTIL) (5 MG/ML) 0.5% nebulizer solution Take 0.5 mLs (2.5 mg total) by nebulization every 6 (six) hours as needed for wheezing or shortness of breath. 20 mL 12  . cetirizine (ZYRTEC) 10 MG tablet Take 10 mg by mouth daily as needed for allergies.    Marland Kitchen ergocalciferol (VITAMIN D2) 50000 UNITS capsule Take 50,000 Units by mouth every 30 (thirty) days. Take on the 1st of every month.    . esomeprazole (NEXIUM) 40 MG capsule Take 40 mg by mouth every morning.  12  . FLOVENT HFA 110 MCG/ACT inhaler Take 2 puffs by mouth 2 (two) times daily.  11  . latanoprost (XALATAN)  0.005 % ophthalmic solution Place 1 drop into both eyes at bedtime.     . lipase/protease/amylase (CREON) 12000 UNITS CPEP capsule Take 1 capsule (12,000 Units total) by mouth 3 (three) times daily with meals. 270 capsule 1  . metoprolol (LOPRESSOR) 50 MG tablet Take 1 tablet (50 mg total) by mouth 2 (two) times daily. 180 tablet 0  . Multiple Vitamin (MULTIVITAMIN WITH MINERALS) TABS Take 1 tablet by mouth daily.     No current facility-administered medications on file prior to visit.   Allergies  Allergen Reactions  . Penicillins Anaphylaxis, Hives and Swelling    . Azithromycin Other (See Comments)    pancreatitis  . Sulfa Antibiotics Hives and Rash   Social History   Social History  . Marital Status: Married    Spouse Name: N/A  . Number of Children: N/A  . Years of Education: N/A   Occupational History  . Teacher (retired)    Social History Main Topics  . Smoking status: Never Smoker   . Smokeless tobacco: Not on file  . Alcohol Use: No  . Drug Use: No  . Sexual Activity: Yes   Other Topics Concern  . Not on file   Social History Narrative   Lives at home with husband.   Family History  Problem Relation Age of Onset  . Rheumatic fever Father     Died suddenly age 14  . Diabetes Father   . Cancer Father     colon  . Heart disease Father     rheumatic fever x 3  . Arthritis Maternal Grandmother   . Hypertension Maternal Grandmother   . Diabetes Paternal Grandmother   . Vision loss Paternal Grandmother     glaucoma      Review of Systems     Objective:   Physical Exam  Constitutional: She is oriented to person, place, and time. She appears well-developed and well-nourished. No distress.  HENT:  Head: Normocephalic and atraumatic.  Right Ear: External ear normal.  Left Ear: External ear normal.  Nose: Nose normal.  Mouth/Throat: Oropharynx is clear and moist. No oropharyngeal exudate.  Eyes: Conjunctivae and EOM are normal. Pupils are equal, round, and reactive to light. Right eye exhibits no discharge.  Neck: Neck supple. No JVD present. No thyromegaly present.  Cardiovascular: Normal rate, regular rhythm, normal heart sounds and intact distal pulses.  Exam reveals no gallop and no friction rub.   No murmur heard. Pulmonary/Chest: Effort normal and breath sounds normal. No stridor. No respiratory distress. She has no wheezes. She has no rales. She exhibits no tenderness.  Abdominal: Soft. Bowel sounds are normal. She exhibits no distension and no mass. There is no tenderness. There is no rebound and no guarding.   Musculoskeletal: Normal range of motion. She exhibits no edema or tenderness.  Lymphadenopathy:    She has no cervical adenopathy.  Neurological: She is alert and oriented to person, place, and time. She displays normal reflexes. No cranial nerve deficit. She exhibits normal muscle tone. Coordination normal.  Skin: Skin is warm. No rash noted. She is not diaphoretic. No erythema. No pallor.  Psychiatric: She has a normal mood and affect. Her behavior is normal. Judgment and thought content normal.  Vitals reviewed.         Assessment & Plan:   Acute rhinosinusitis - Plan: levofloxacin (LEVAQUIN) 500 MG tablet  History of Hodgkin's disease  History of breast cancer  Pancreatic insufficiency (HCC)  Mitral regurgitation  Osteoporosis  Prediabetes  Physical exam today is completely normal. She does have a mild sinus infection. I recommended tincture of time. If symptoms worsen and she develops high fever and purulent drainage with facial pain, she can get prescription for Levaquin 500 mg daily for 7 days. Immunizations are up-to-date. Cancer screenings up-to-date. I will review her most recent lab work at her previous PCP including a CBC CMP and fasting lipid panel and hopefully a hemoglobin A1c. Goal hemoglobin A1c is less than 6.5. She is due for a bone density test next year. She will have reclast schedule to her orthopedic surgeon.  She has a tentative diagnosis of COPD but she takes an inhaled steroid individually. I have recommended discontinuation of Flovent and to use albuterol as needed. If she has to use the albuterol more frequently, I will start the patient on Symbicort.

## 2015-11-10 ENCOUNTER — Other Ambulatory Visit: Payer: Self-pay | Admitting: Cardiology

## 2015-11-10 LAB — HM MAMMOGRAPHY: HM Mammogram: NEGATIVE

## 2015-11-10 NOTE — Telephone Encounter (Signed)
REFILL 

## 2015-11-13 ENCOUNTER — Encounter: Payer: Self-pay | Admitting: Family Medicine

## 2015-11-18 ENCOUNTER — Telehealth: Payer: Self-pay | Admitting: Family Medicine

## 2015-11-18 NOTE — Telephone Encounter (Signed)
Pt was wanting to know if we received labs from [place] and if she needed to make a f/u appt as her husband will need a 3 month f/u and she would like to schedule her at that time if she needs one.??

## 2015-11-19 NOTE — Telephone Encounter (Signed)
Yes, I would like to see her in 3 months but I would like to recheck her husband's alk phos in 1 month (just lab, no ov)

## 2015-11-20 NOTE — Telephone Encounter (Signed)
Pt aware and appt made.

## 2016-01-28 ENCOUNTER — Ambulatory Visit: Payer: BC Managed Care – PPO | Admitting: Family Medicine

## 2016-07-07 ENCOUNTER — Ambulatory Visit (INDEPENDENT_AMBULATORY_CARE_PROVIDER_SITE_OTHER): Payer: Medicare Other | Admitting: Family Medicine

## 2016-07-07 VITALS — BP 168/88 | HR 66 | Temp 98.0°F | Resp 14 | Ht 66.0 in | Wt 139.0 lb

## 2016-07-07 DIAGNOSIS — R7303 Prediabetes: Secondary | ICD-10-CM

## 2016-07-07 DIAGNOSIS — Z23 Encounter for immunization: Secondary | ICD-10-CM

## 2016-07-07 DIAGNOSIS — K8689 Other specified diseases of pancreas: Secondary | ICD-10-CM | POA: Diagnosis not present

## 2016-07-07 DIAGNOSIS — M810 Age-related osteoporosis without current pathological fracture: Secondary | ICD-10-CM | POA: Diagnosis not present

## 2016-07-07 DIAGNOSIS — Z8571 Personal history of Hodgkin lymphoma: Secondary | ICD-10-CM | POA: Diagnosis not present

## 2016-07-07 DIAGNOSIS — Z853 Personal history of malignant neoplasm of breast: Secondary | ICD-10-CM

## 2016-07-07 LAB — LIPID PANEL
Cholesterol: 183 mg/dL (ref 125–200)
HDL: 53 mg/dL (ref >=46)
LDL Cholesterol: 99 mg/dL (ref <130)
Total CHOL/HDL Ratio: 3.5 Ratio (ref <=5.0)
Triglycerides: 153 mg/dL — ABNORMAL HIGH (ref <150)
VLDL: 31 mg/dL — ABNORMAL HIGH (ref <30)

## 2016-07-07 LAB — CBC WITH DIFFERENTIAL/PLATELET
Basophils Absolute: 81 cells/uL (ref 0–200)
Basophils Relative: 1 %
Eosinophils Absolute: 243 cells/uL (ref 15–500)
Eosinophils Relative: 3 %
HCT: 38.4 % (ref 35.0–45.0)
Hemoglobin: 12.9 g/dL (ref 12.0–15.0)
Lymphocytes Relative: 24 %
Lymphs Abs: 1944 cells/uL (ref 850–3900)
MCH: 29.5 pg (ref 27.0–33.0)
MCHC: 33.6 g/dL (ref 32.0–36.0)
MCV: 87.7 fL (ref 80.0–100.0)
MPV: 8.5 fL (ref 7.5–12.5)
Monocytes Absolute: 891 cells/uL (ref 200–950)
Monocytes Relative: 11 %
Neutro Abs: 4941 cells/uL (ref 1500–7800)
Neutrophils Relative %: 61 %
Platelets: 510 10*3/uL — ABNORMAL HIGH (ref 140–400)
RBC: 4.38 MIL/uL (ref 3.80–5.10)
RDW: 14.3 % (ref 11.0–15.0)
WBC: 8.1 10*3/uL (ref 3.8–10.8)

## 2016-07-07 LAB — COMPLETE METABOLIC PANEL WITH GFR
ALT: 18 U/L (ref 6–29)
AST: 18 U/L (ref 10–35)
Albumin: 4.6 g/dL (ref 3.6–5.1)
Alkaline Phosphatase: 74 U/L (ref 33–130)
BUN: 18 mg/dL (ref 7–25)
CO2: 26 mmol/L (ref 20–31)
Calcium: 10.1 mg/dL (ref 8.6–10.4)
Chloride: 102 mmol/L (ref 98–110)
Creat: 1.06 mg/dL — ABNORMAL HIGH (ref 0.50–0.99)
GFR, Est African American: 64 mL/min (ref >=60)
GFR, Est Non African American: 55 mL/min — ABNORMAL LOW (ref >=60)
Glucose, Bld: 110 mg/dL — ABNORMAL HIGH (ref 70–99)
Potassium: 5.4 mmol/L — ABNORMAL HIGH (ref 3.5–5.3)
Sodium: 141 mmol/L (ref 135–146)
Total Bilirubin: 0.5 mg/dL (ref 0.2–1.2)
Total Protein: 7.3 g/dL (ref 6.1–8.1)

## 2016-07-07 LAB — HEMOGLOBIN A1C
Hgb A1c MFr Bld: 6.2 % — ABNORMAL HIGH (ref <5.7)
Mean Plasma Glucose: 131 mg/dL

## 2016-07-07 NOTE — Progress Notes (Signed)
Subjective:    Patient ID: Patricia [name], female    DOB: 1951/01/27, 65 y.o.   MRN: [id number]  HPI  10/16 Patient is a 65 y/o WF whom I have known my entire life through church.  She is here today to establish care.  She has a complicated past medical history. In the mid 70s she was diagnosed with Hodgkin's disease and underwent radiation therapy for treatment. She's been in remission since 1976. She also has a history of left-sided breast cancer status post mastectomy and chemotherapy in 2005. She also has a history of mitral valve regurgitation with atrial flutter status post ablation. She still gets occasional PVCs and for that reason she takes metoprolol along with a baby aspirin. She also has a history of osteoporosis. Last bone density was in 2015. She is taking Fosamax for 5 years. Recently her doctor stopped the Fosamax and switched her to recline. She is due for a repeat request injection this year and a repeat bone density next year. She also has a history of prediabetes managed with diet. In January, she developed pancreatitis. Presently they believe it was likely due to a Z-Pak. She is subsequently developed pancreatic insufficiency since that time for which she takes pancreatic enzyme replacement therapy under the care of her gastroenterologist. Pneumovax 23 and Prevnar 13 are up-to-date. Her flu shot is up-to-date. Her colonoscopy is up-to-date. Her mammogram is scheduled for later this year. Her last pass was 2 years ago and is up-to-date.  At that time, my plan was:  Physical exam today is completely normal. She does have a mild sinus infection. I recommended tincture of time. If symptoms worsen and she develops high fever and purulent drainage with facial pain, she can get prescription for Levaquin 500 mg daily for 7 days. Immunizations are up-to-date. Cancer screenings up-to-date. I will review her most recent lab work at her previous PCP including a CBC CMP and fasting lipid panel  and hopefully a hemoglobin A1c. Goal hemoglobin A1c is less than 6.5. She is due for a bone density test next year. She will have reclast schedule to her orthopedic surgeon.  She has a tentative diagnosis of COPD but she takes an inhaled steroid individually. I have recommended discontinuation of Flovent and to use albuterol as needed. If she has to use the albuterol more frequently, I will start the patient on Symbicort.  07/07/16 Patient is here today for follow-up. Overall she's been doing very well. She has a history of Pneumovax 23 approximate 3 years ago. She is due for Prevnar 13. Since the last time I saw the patient, her husband has been battling pancreatic cancer. Her blood pressure today is elevated. This is a brand-new finding for this patient. She denies any pain or anxiety. She is under stress with her husband's pancreatic cancer. She denies any chest pain shortness of breath or dyspnea on exertion. She's been off all bisphosphonates for approximately 2 years. She will be due for repeat bone density next year. Mammogram is up-to-date. Otherwise she is doing well with no concerns. She is due for fasting lab work pertaining to her prediabetes. Her fasting blood sugars are all between 110 and 130 Past Medical History:  Diagnosis Date  . Atrial flutter (HCC)    Ablated 1998 Dr. Caryl Comes  . Breast cancer ([name])   . COPD (chronic obstructive pulmonary disease) ([place])   . Hodgkin's lymphoma ([place])    Treated with radiation and chemo  . Mitral regurgitation  pvc  . Osteoporosis    lumbar verterbral fracture, left ankle fracture, dexa 2015  . Pancreatitis   . Prediabetes   . Ulcer    Past Surgical History:  Procedure Laterality Date  . CARDIAC ELECTROPHYSIOLOGY [place] AND ABLATION  1999  . CATARACT EXTRACTION, BILATERAL    . COLONOSCOPY    . LAPAROTOMY    . MASTECTOMY  2005   Left-axillary  . ORIF ANKLE FRACTURE Left 03/21/2014   Procedure: LEFT ANKLE FRACTURE OPEN TREATMENT BILMALLEOLAR  INCLUDES INTERNAL FIXATION ;  Surgeon: Renette Butters, MD;  Location: [place];  Service: Orthopedics;  Laterality: Left;  . 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 TEE WITHOUT CARDIOVERSION  06/12/2012   Procedure: TRANSESOPHAGEAL ECHOCARDIOGRAM (TEE);  Surgeon: Larey [name], MD;  Location: [place];  Service: Cardiovascular;  Laterality: N/A;  . TONSILLECTOMY AND ADENOIDECTOMY     Current Outpatient Prescriptions on File Prior to Visit  Medication Sig Dispense Refill  . albuterol (PROVENTIL) (5 MG/ML) 0.5% nebulizer solution Take 0.5 mLs (2.5 mg total) by nebulization every 6 (six) hours as needed for wheezing or shortness of breath. 20 mL 12  . cetirizine (ZYRTEC) 10 MG tablet Take 10 mg by mouth daily as needed for allergies.    Marland Kitchen ergocalciferol (VITAMIN D2) 50000 UNITS capsule Take 50,000 Units by mouth every 30 (thirty) days. Take on the 1st of every month.    . esomeprazole (NEXIUM) 40 MG capsule Take 40 mg by mouth every morning.  12  . FLOVENT HFA 110 MCG/ACT inhaler Take 2 puffs by mouth 2 (two) times daily.  11  . latanoprost (XALATAN) 0.005 % ophthalmic solution Place 1 drop into both eyes at bedtime.     . lipase/protease/amylase (CREON) 12000 UNITS CPEP capsule Take 1 capsule (12,000 Units total) by mouth 3 (three) times daily with meals. 270 capsule 1  . metoprolol (LOPRESSOR) 50 MG tablet TAKE 1 TABLET (50 MG TOTAL) BY MOUTH 2 (TWO) TIMES DAILY. 180 tablet 3  . Multiple Vitamin (MULTIVITAMIN WITH MINERALS) TABS Take 1 tablet by mouth daily.    Glory Rosebush DELICA LANCETS [id number] MISC TEST SUGAR DAILY  4  . ONETOUCH VERIO test strip CHECK SUGAR DAILY  12   No current facility-administered medications on file prior to visit.    Allergies  Allergen Reactions  . Penicillins Anaphylaxis, Hives and Swelling  . Azithromycin Other (See Comments)    pancreatitis  . Sulfa Antibiotics Hives and Rash   Social History   Social History  . Marital status: Married      Spouse name: N/A  . Number of children: N/A  . Years of education: N/A   Occupational History  . Teacher (retired)    Social History Main Topics  . Smoking status: Never Smoker  . Smokeless tobacco: Not on file  . Alcohol use No  . Drug use: No  . Sexual activity: Yes   Other Topics Concern  . Not on file   Social History Narrative   Lives at home with husband.   Family History  Problem Relation Age of Onset  . Rheumatic fever Father     Died suddenly age 58  . Diabetes Father   . Cancer Father     colon  . Heart disease Father     rheumatic fever x 3  . Arthritis Maternal Grandmother   . Hypertension Maternal Grandmother   . Diabetes Paternal Grandmother   . Vision loss Paternal Grandmother  glaucoma      Review of Systems     Objective:   Physical Exam  Constitutional: She is oriented to person, place, and time. She appears well-developed and well-nourished. No distress.  HENT:  Head: Normocephalic and atraumatic.  Right Ear: External ear normal.  Left Ear: External ear normal.  Nose: Nose normal.  Mouth/Throat: Oropharynx is clear and moist. No oropharyngeal exudate.  Eyes: Conjunctivae and EOM are normal. Pupils are equal, round, and reactive to light. Right eye exhibits no discharge.  Neck: Neck supple. No JVD present. No thyromegaly present.  Cardiovascular: Normal rate, regular rhythm, normal heart sounds and intact distal pulses.  Exam reveals no gallop and no friction rub.   No murmur heard. Pulmonary/Chest: Effort normal and breath sounds normal. No stridor. No respiratory distress. She has no wheezes. She has no rales. She exhibits no tenderness.  Abdominal: Soft. Bowel sounds are normal. She exhibits no distension and no mass. There is no tenderness. There is no rebound and no guarding.  Musculoskeletal: Normal range of motion. She exhibits no edema or tenderness.  Lymphadenopathy:    She has no cervical adenopathy.  Neurological: She is  alert and oriented to person, place, and time. She displays normal reflexes. No cranial nerve deficit. She exhibits normal muscle tone. Coordination normal.  Skin: Skin is warm. No rash noted. She is not diaphoretic. No erythema. No pallor.  Psychiatric: She has a normal mood and affect. Her behavior is normal. Judgment and thought content normal.  Vitals reviewed.         Assessment & Plan:   History of breast cancer  History of Hodgkin's disease  Prediabetes - Plan: CBC with Differential/Platelet, COMPLETE METABOLIC PANEL WITH GFR, Lipid panel, Hemoglobin A1c  Pancreatic insufficiency (HCC)  Osteoporosis I will check fasting lab work including a CMP, fasting lipid panel, and hemoglobin A1c pertaining to prediabetes. Her blood pressures elevated. She believes this is a spurious reading. She will check her blood pressure everyday at home and notify me of the values. If persistently greater than 140/90, we will start medication. Mammogram is up-to-date. I will schedule a bone density test next year. She received Prevnar 13 today. She will receive Pneumovax 23 in 2 years.

## 2016-09-02 ENCOUNTER — Encounter: Payer: Self-pay | Admitting: Family Medicine

## 2016-09-02 ENCOUNTER — Ambulatory Visit (INDEPENDENT_AMBULATORY_CARE_PROVIDER_SITE_OTHER): Payer: Medicare Other | Admitting: Family Medicine

## 2016-09-02 VITALS — BP 166/70 | HR 88 | Temp 98.3°F | Resp 16 | Ht 66.0 in | Wt 142.0 lb

## 2016-09-02 DIAGNOSIS — B029 Zoster without complications: Secondary | ICD-10-CM | POA: Diagnosis not present

## 2016-09-02 MED ORDER — VALACYCLOVIR HCL 1 G PO TABS: 1000.0000 mg | ORAL_TABLET | Freq: Three times a day (TID) | ORAL | 0 refills | Status: DC

## 2016-09-02 MED ORDER — OXYCODONE-ACETAMINOPHEN 5-325 MG PO TABS: 1.0000 | ORAL_TABLET | Freq: Four times a day (QID) | ORAL | 0 refills | Status: DC | PRN

## 2016-09-02 NOTE — Progress Notes (Signed)
Subjective:    Patient ID: Patricia [name], female    DOB: 1951/05/13, 65 y.o.   MRN: [id number]  HPI Rash started earlier this week. Rash starts on her lower back on the left side approximately the level of L3. It radiates around her back in a dermatomal pattern to her abdomen/umbilicus. The rash consists of itchy stinging erythematous patches. There are no papules or vesicles. However the patient has had the shingles vaccine which may be possibly suppressing the rash.  However she is been trying cortisone cream with no benefit. It itches and stings and burns Past Medical History:  Diagnosis Date  . Atrial flutter (HCC)    Ablated 1998 Dr. Caryl Comes  . Breast cancer ([place])   . COPD (chronic obstructive pulmonary disease) ([place])   . Hodgkin's lymphoma ([place])    Treated with radiation and chemo  . Mitral regurgitation    pvc  . Osteoporosis    lumbar verterbral fracture, left ankle fracture, dexa 2015  . Pancreatitis   . Prediabetes   . Ulcer    Past Surgical History:  Procedure Laterality Date  . CARDIAC ELECTROPHYSIOLOGY [place] AND ABLATION  1999  . CATARACT EXTRACTION, BILATERAL    . COLONOSCOPY    . LAPAROTOMY    . MASTECTOMY  2005   Left-axillary  . ORIF ANKLE FRACTURE Left 03/21/2014   Procedure: LEFT ANKLE FRACTURE OPEN TREATMENT BILMALLEOLAR INCLUDES INTERNAL FIXATION ;  Surgeon: Renette Butters, MD;  Location: [facility];  Service: Orthopedics;  Laterality: Left;  . 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 TEE WITHOUT CARDIOVERSION  06/12/2012   Procedure: TRANSESOPHAGEAL ECHOCARDIOGRAM (TEE);  Surgeon: Larey [name], MD;  Location: [facility];  Service: Cardiovascular;  Laterality: N/A;  . TONSILLECTOMY AND ADENOIDECTOMY     Current Outpatient Prescriptions on File Prior to Visit  Medication Sig Dispense Refill  . albuterol (PROVENTIL) (5 MG/ML) 0.5% nebulizer solution Take 0.5 mLs (2.5 mg total) by nebulization every 6 (six) hours as needed for wheezing  or shortness of breath. 20 mL 12  . cetirizine (ZYRTEC) 10 MG tablet Take 10 mg by mouth daily as needed for allergies.    Marland Kitchen ergocalciferol (VITAMIN D2) 50000 UNITS capsule Take 50,000 Units by mouth every 30 (thirty) days. Take on the 1st of every month.    . esomeprazole (NEXIUM) 40 MG capsule Take 40 mg by mouth every morning.  12  . FLOVENT HFA 110 MCG/ACT inhaler Take 2 puffs by mouth 2 (two) times daily.  11  . latanoprost (XALATAN) 0.005 % ophthalmic solution Place 1 drop into both eyes at bedtime.     . lipase/protease/amylase (CREON) 12000 UNITS CPEP capsule Take 1 capsule (12,000 Units total) by mouth 3 (three) times daily with meals. 270 capsule 1  . metoprolol (LOPRESSOR) 50 MG tablet TAKE 1 TABLET (50 MG TOTAL) BY MOUTH 2 (TWO) TIMES DAILY. 180 tablet 3  . Multiple Vitamin (MULTIVITAMIN WITH MINERALS) TABS Take 1 tablet by mouth daily.    Glory Rosebush DELICA LANCETS 99991111 MISC TEST SUGAR DAILY  4  . ONETOUCH VERIO test strip CHECK SUGAR DAILY  12   No current facility-administered medications on file prior to visit.    Allergies  Allergen Reactions  . Penicillins Anaphylaxis, Hives and Swelling  . Azithromycin Other (See Comments)    pancreatitis  . Sulfa Antibiotics Hives and Rash   Social History   Social History  . Marital status: Married    Spouse name:  N/A  . Number of children: N/A  . Years of education: N/A   Occupational History  . Teacher (retired)    Social History Main Topics  . Smoking status: Never Smoker  . Smokeless tobacco: Not on file  . Alcohol use No  . Drug use: No  . Sexual activity: Yes   Other Topics Concern  . Not on file   Social History Narrative   Lives at home with husband.    Review of Systems  All other systems reviewed and are negative.      Objective:   Physical Exam  Constitutional: She appears well-developed and well-nourished. No distress.  Cardiovascular: Normal rate, regular rhythm and normal heart sounds.     Pulmonary/Chest: Effort normal and breath sounds normal.  Skin: Rash noted. She is not diaphoretic. There is erythema.  Vitals reviewed.         Assessment & Plan:  Shingles - Plan: valACYclovir (VALTREX) 1000 MG tablet, oxyCODONE-acetaminophen (ROXICET) 5-325 MG tablet  I believe this is shingles. Use Valtrex 1000 mg by mouth 3 times a day for 7-10 days. Also gave the patient prescription for Percocet in case the pain worsens.

## 2016-09-15 ENCOUNTER — Ambulatory Visit (INDEPENDENT_AMBULATORY_CARE_PROVIDER_SITE_OTHER): Payer: Medicare Other | Admitting: *Deleted

## 2016-09-15 ENCOUNTER — Encounter: Payer: Self-pay | Admitting: *Deleted

## 2016-09-15 DIAGNOSIS — Z23 Encounter for immunization: Secondary | ICD-10-CM

## 2016-09-15 NOTE — Progress Notes (Signed)
Patient seen in office for Influenza Vaccination.   Tolerated IM administration well.   Immunization history updated.  

## 2016-09-21 ENCOUNTER — Ambulatory Visit (INDEPENDENT_AMBULATORY_CARE_PROVIDER_SITE_OTHER): Payer: Medicare Other | Admitting: Physician Assistant

## 2016-09-21 ENCOUNTER — Encounter: Payer: Self-pay | Admitting: Physician Assistant

## 2016-09-21 VITALS — BP 142/78 | HR 94 | Temp 98.1°F | Resp 16 | Wt 145.0 lb

## 2016-09-21 DIAGNOSIS — W57XXXA Bitten or stung by nonvenomous insect and other nonvenomous arthropods, initial encounter: Secondary | ICD-10-CM | POA: Diagnosis not present

## 2016-09-21 DIAGNOSIS — S91052A Open bite, left ankle, initial encounter: Secondary | ICD-10-CM

## 2016-09-21 NOTE — Progress Notes (Signed)
Patient ID: ANJUM CORTOPASSI MRN: [id number], DOB: 08/09/1951, 65 y.o. Date of Encounter: 09/21/2016, 3:18 PM    Chief Complaint:  Chief Complaint  Patient presents with  . office visit    insect bite on left leg happened mon night     HPI: 65 y.o. year old white female thinks she got bit by something While she was sleeping Monday night. Says that she did not notice this area on her lower leg during the evening before going to sleep Monday. She never felt any type of bite or sting. However the next morning this area was on her posterior aspect of her left ankle region. She has been taking no medication doing nothing no treatments. Says that the site is not itchy just hurt some. Never felt any bite or sting. Has had no other systemic symptoms at all. Has not been feeling sick in any way. Has had no increased fatigue compared to usual. No abdominal pain nausea vomiting or diarrhea. No fevers or chills.     Home Meds:   Outpatient Medications Prior to Visit  Medication Sig Dispense Refill  . albuterol (PROVENTIL) (5 MG/ML) 0.5% nebulizer solution Take 0.5 mLs (2.5 mg total) by nebulization every 6 (six) hours as needed for wheezing or shortness of breath. 20 mL 12  . cetirizine (ZYRTEC) 10 MG tablet Take 10 mg by mouth daily as needed for allergies.    Marland Kitchen ergocalciferol (VITAMIN D2) 50000 UNITS capsule Take 50,000 Units by mouth every 30 (thirty) days. Take on the 1st of every month.    . esomeprazole (NEXIUM) 40 MG capsule Take 40 mg by mouth every morning.  12  . FLOVENT HFA 110 MCG/ACT inhaler Take 2 puffs by mouth 2 (two) times daily.  11  . latanoprost (XALATAN) 0.005 % ophthalmic solution Place 1 drop into both eyes at bedtime.     . lipase/protease/amylase (CREON) 12000 UNITS CPEP capsule Take 1 capsule (12,000 Units total) by mouth 3 (three) times daily with meals. 270 capsule 1  . metoprolol (LOPRESSOR) 50 MG tablet TAKE 1 TABLET (50 MG TOTAL) BY MOUTH 2 (TWO) TIMES DAILY. 180  tablet 3  . Multiple Vitamin (MULTIVITAMIN WITH MINERALS) TABS Take 1 tablet by mouth daily.    Glory Rosebush DELICA LANCETS 99991111 MISC TEST SUGAR DAILY  4  . ONETOUCH VERIO test strip CHECK SUGAR DAILY  12  . oxyCODONE-acetaminophen (ROXICET) 5-325 MG tablet Take 1-2 tablets by mouth every 6 (six) hours as needed for severe pain. 20 tablet 0  . valACYclovir (VALTREX) 1000 MG tablet Take 1 tablet (1,000 mg total) by mouth 3 (three) times daily. 30 tablet 0   No facility-administered medications prior to visit.     Allergies:  Allergies  Allergen Reactions  . Penicillins Anaphylaxis, Hives and Swelling  . Azithromycin Other (See Comments)    pancreatitis  . Sulfa Antibiotics Hives and Rash      Review of Systems: See HPI for pertinent ROS. All other ROS negative.    Physical Exam: Blood pressure (!) 142/78, pulse 94, temperature 98.1 F (36.7 C), temperature source Oral, resp. rate 16, weight 145 lb (65.8 kg), SpO2 98 %., Body mass index is 23.4 kg/m. General:  WNWD WF. Appears in no acute distress. Neck: Supple. No thyromegaly. No lymphadenopathy. Lungs: Clear bilaterally to auscultation without wheezes, rales, or rhonchi. Breathing is unlabored. Heart: Regular rhythm. No murmurs, rubs, or gallops. Msk:  Strength and tone normal for age. Extremities/Skin: Posterior aspect of left  ankle---there is a blister (~ 1 cm diameter) and there is surrounding erythema ( ~ 1 inch diameter) Neuro: Alert and oriented X 3. Moves all extremities spontaneously. Gait is normal. CNII-XII grossly in tact. Psych:  Responds to questions appropriately with a normal affect.     ASSESSMENT AND PLAN:  65 y.o. year old female with  1. Insect bite, initial encounter She's had no signs or symptoms to indicate that this was a black widow spider. There is no skin necrosis to suggest brown recluse spider. There is no sign of infection. Told her to keep the blister intact as long as possible. She is to take  Benadryl on a routine basis. Follow-up if site worsens or does not resolve or if develops any signs of secondary infection and I discussed these signs with her.   Marin Olp [place], [place], [employer] 09/21/2016 3:18 PM

## 2016-09-29 DIAGNOSIS — C4491 Basal cell carcinoma of skin, unspecified: Secondary | ICD-10-CM

## 2016-09-29 HISTORY — DX: Basal cell carcinoma of skin, unspecified: C44.91

## 2016-11-01 LAB — HM MAMMOGRAPHY

## 2016-11-10 ENCOUNTER — Encounter: Payer: Self-pay | Admitting: Family Medicine

## 2016-11-17 ENCOUNTER — Encounter: Payer: Self-pay | Admitting: Cardiology

## 2016-11-17 NOTE — Progress Notes (Signed)
HPI The patient presents for evaluation of mitral regurgitation. Transthoracic echo in the past demonstrated moderate to severe mitral regurgitation with mitral valve prolapse and moderate stenosis . However, followup TEE suggested the MR to be moderate. Echo last year found this to be unchanged.   She also had a history of flutter ablated.  Since I last saw her she has done well.  Her husband was diagnosed with pancreatic cancer almost one year ago.  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  The patient denies any new symptoms such as chest discomfort, neck or arm discomfort. There has been no new shortness of breath, PND or orthopnea. There have been no reported palpitations, presyncope or syncope.   Allergies  Allergen Reactions  . Penicillins Anaphylaxis, Hives and Swelling  . Azithromycin Other (See Comments)    pancreatitis  . Sulfa Antibiotics Hives and Rash    Current Outpatient Prescriptions  Medication Sig Dispense Refill  . cetirizine (ZYRTEC) 10 MG tablet Take 10 mg by mouth daily as needed for allergies.    Marland Kitchen esomeprazole (NEXIUM) 40 MG capsule Take 40 mg by mouth every morning.  12  . FLOVENT HFA 110 MCG/ACT inhaler Take 2 puffs by mouth 2 (two) times daily.  11  . latanoprost (XALATAN) 0.005 % ophthalmic solution Place 1 drop into both eyes at bedtime.     . lipase/protease/amylase (CREON) 12000 UNITS CPEP capsule Take 1 capsule (12,000 Units total) by mouth 3 (three) times daily with meals. 270 capsule 1  . metoprolol (LOPRESSOR) 50 MG tablet TAKE 1 TABLET (50 MG TOTAL) BY MOUTH 2 (TWO) TIMES DAILY. 180 tablet 3  . Multiple Vitamin (MULTIVITAMIN WITH MINERALS) TABS Take 1 tablet by mouth daily.    Patricia [name] DELICA LANCETS 99991111 MISC TEST SUGAR DAILY  4  . ONETOUCH VERIO test strip CHECK SUGAR DAILY  12   No current facility-administered medications for this visit.     Past Medical History:  Diagnosis Date  . Atrial flutter (HCC)    Ablated 1998 Dr. Caryl Comes  . Breast cancer ([place])   . COPD (chronic obstructive pulmonary disease) ([place])   . Hodgkin's lymphoma ([place])    Treated with radiation and chemo  . Mitral regurgitation    pvc  . Osteoporosis    lumbar verterbral fracture, left ankle fracture, dexa 2015  . Pancreatitis   . Prediabetes   . Ulcer [facility])     Past Surgical History:  Procedure Laterality Date  . CARDIAC ELECTROPHYSIOLOGY [place] AND ABLATION  1999  . CATARACT EXTRACTION, BILATERAL    . COLONOSCOPY    . LAPAROTOMY    . MASTECTOMY  2005   Left-axillary  . ORIF ANKLE FRACTURE Left 03/21/2014   Procedure: LEFT ANKLE FRACTURE OPEN TREATMENT BILMALLEOLAR INCLUDES INTERNAL FIXATION ;  Surgeon: Renette Butters, MD;  Location: [facility];  Service: Orthopedics;  Laterality: Left;  . 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 TEE WITHOUT CARDIOVERSION  06/12/2012   Procedure: TRANSESOPHAGEAL ECHOCARDIOGRAM (TEE);  Surgeon: Larey [name], MD;  Location: [facility];  Service: Cardiovascular;  Laterality: N/A;  . TONSILLECTOMY AND ADENOIDECTOMY       ROS:    As stated in the HPI and negative for all other systems.   PHYSICAL EXAM BP (!) 156/74   Pulse 97   Ht 5\' 6"  (1.676 m)   Wt 147 lb (66.7 kg)   BMI 23.73 kg/m  GEN:  No  distress NECK:  No jugular venous distention, waveform within normal limits, carotid upstroke brisk and symmetric, no bruits, no thyromegaly LUNGS:  Clear to auscultation bilaterally BACK:  No CVA tenderness HEART:  S1 and S2 within normal limits, no S3, no S4, no clicks, no rubs, 2/6 systolic murmur heard best in the axilla and slightly posterior holosystolic, no diastolic murmurs ABD:  Positive bowel sounds normal in frequency in pitch, no bruits, no rebound, no guarding,  no midline pulsatile mass, no hepatomegaly . EXT:  2 plus pulses throughout, moderate edema, no cyanosis no clubbing  EKG:  Sinus rhythm, rate 97, axis within normal limits, intervals within  normal limits, no acute ST-T wave changes.  11/18/2016    ASSESSMENT AND PLAN  MR:  There was moderate to severe regurgitation and moderate stenosis but this was unchanged on echo last year.  I will repeat this.     FLUTTER:  She has had occasional palpitations but no sustained tachyarrhythmias. No further evaluation or change in therapy is planned.

## 2016-11-18 ENCOUNTER — Ambulatory Visit (INDEPENDENT_AMBULATORY_CARE_PROVIDER_SITE_OTHER): Payer: Medicare Other | Admitting: Cardiology

## 2016-11-18 ENCOUNTER — Encounter: Payer: Self-pay | Admitting: Cardiology

## 2016-11-18 VITALS — BP 156/74 | HR 97 | Ht 66.0 in | Wt 147.0 lb

## 2016-11-18 DIAGNOSIS — I340 Nonrheumatic mitral (valve) insufficiency: Secondary | ICD-10-CM | POA: Diagnosis not present

## 2016-11-18 NOTE — Patient Instructions (Signed)
Medication Instructions:  Continue current medications  Labwork: None Ordered  Testing/Procedures: Your physician has requested that you have an echocardiogram. Echocardiography is a painless test that uses sound waves to create images of your heart. It provides your doctor with information about the size and shape of your heart and how well your heart's chambers and valves are working. This procedure takes approximately one hour. There are no restrictions for this procedure.   Follow-Up: Your physician wants you to follow-up in: 1 Year. You will receive a reminder letter in the mail two months in advance. If you don't receive a letter, please call our office to schedule the follow-up appointment.   Any Other Special Instructions Will Be Listed Below (If Applicable).   If you need a refill on your cardiac medications before your next appointment, please call your pharmacy.     

## 2016-12-09 ENCOUNTER — Other Ambulatory Visit (HOSPITAL_COMMUNITY): Payer: Medicare Other

## 2016-12-20 ENCOUNTER — Other Ambulatory Visit: Payer: Self-pay | Admitting: Cardiology

## 2016-12-23 ENCOUNTER — Other Ambulatory Visit: Payer: Self-pay

## 2016-12-23 ENCOUNTER — Ambulatory Visit (HOSPITAL_COMMUNITY): Payer: Medicare Other | Attending: Cardiology

## 2016-12-23 DIAGNOSIS — I4892 Unspecified atrial flutter: Secondary | ICD-10-CM | POA: Insufficient documentation

## 2016-12-23 DIAGNOSIS — I340 Nonrheumatic mitral (valve) insufficiency: Secondary | ICD-10-CM | POA: Diagnosis present

## 2017-01-16 ENCOUNTER — Telehealth: Payer: Self-pay | Admitting: Cardiology

## 2017-01-16 ENCOUNTER — Encounter: Payer: Self-pay | Admitting: Cardiology

## 2017-01-16 NOTE — Telephone Encounter (Signed)
Patricia [name] is returning a call about her Echo results . Please call , If no answer at home number please call cell# [phone]

## 2017-01-17 NOTE — Telephone Encounter (Signed)
Spoke with pt about her result

## 2017-03-03 ENCOUNTER — Other Ambulatory Visit: Payer: Self-pay | Admitting: Family Medicine

## 2017-03-03 NOTE — Telephone Encounter (Signed)
Medication refilled per protocol. 

## 2017-03-06 ENCOUNTER — Ambulatory Visit
Admission: RE | Admit: 2017-03-06 | Discharge: 2017-03-06 | Disposition: A | Discharge status: Home / Self Care | Payer: Medicare Other | Source: Ambulatory Visit | Attending: Family Medicine | Admitting: Family Medicine

## 2017-03-06 ENCOUNTER — Ambulatory Visit (INDEPENDENT_AMBULATORY_CARE_PROVIDER_SITE_OTHER): Payer: Medicare Other | Admitting: Family Medicine

## 2017-03-06 ENCOUNTER — Encounter: Payer: Self-pay | Admitting: Family Medicine

## 2017-03-06 VITALS — BP 170/68 | HR 136 | Temp 102.8°F | Resp 18 | Ht 66.0 in | Wt 144.0 lb

## 2017-03-06 DIAGNOSIS — R509 Fever, unspecified: Secondary | ICD-10-CM

## 2017-03-06 DIAGNOSIS — R000 Tachycardia, unspecified: Secondary | ICD-10-CM | POA: Diagnosis not present

## 2017-03-06 MED ORDER — LEVOFLOXACIN 750 MG PO TABS: 750.0000 mg | ORAL_TABLET | Freq: Every day | ORAL | 0 refills | Status: DC

## 2017-03-06 MED ORDER — OSELTAMIVIR PHOSPHATE 75 MG PO CAPS: 75.0000 mg | ORAL_CAPSULE | Freq: Two times a day (BID) | ORAL | 0 refills | Status: DC

## 2017-03-06 NOTE — Progress Notes (Addendum)
Subjective:    Patient ID: Patricia [name], female    DOB: Nov 14, 1951, 66 y.o.   MRN: [id number]  HPI Symptoms began last Wednesday with a dry nonproductive cough. She also reports a rattling sensation in her chest. She reports shortness of breath and chest congestion. She denies any hemoptysis. She denies any purulent sputum. She denies any ear pain, sinus pain, sore throat. She has a fever to 102.8. She feels extremely weak and tired. Her heart rate is extremely high 136 which could be reactive to the fever. She denies any chest pain or pressure but she does report some shortness of breath. Exam other than the tachycardia and the fever is unremarkable Past Medical History:  Diagnosis Date  . Atrial flutter (HCC)    Ablated 1998 Dr. Caryl Comes  . Breast cancer ([place])   . COPD (chronic obstructive pulmonary disease) ([place])   . Hodgkin's lymphoma ([place])    Treated with radiation and chemo  . Mitral regurgitation    pvc  . Osteoporosis    lumbar verterbral fracture, left ankle fracture, dexa 2015  . Pancreatitis   . Prediabetes   . Ulcer [facility])    Past Surgical History:  Procedure Laterality Date  . CARDIAC ELECTROPHYSIOLOGY [place] AND ABLATION  1999  . CATARACT EXTRACTION, BILATERAL    . COLONOSCOPY    . LAPAROTOMY    . MASTECTOMY  2005   Left-axillary  . ORIF ANKLE FRACTURE Left 03/21/2014   Procedure: LEFT ANKLE FRACTURE OPEN TREATMENT BILMALLEOLAR INCLUDES INTERNAL FIXATION ;  Surgeon: Renette Butters, MD;  Location: [place];  Service: Orthopedics;  Laterality: Left;  . 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 TEE WITHOUT CARDIOVERSION  06/12/2012   Procedure: TRANSESOPHAGEAL ECHOCARDIOGRAM (TEE);  Surgeon: Larey [name], MD;  Location: [place];  Service: Cardiovascular;  Laterality: N/A;  . TONSILLECTOMY AND ADENOIDECTOMY     Current Outpatient Prescriptions on File Prior to Visit  Medication Sig Dispense Refill  . albuterol (PROVENTIL) (2.5 MG/3ML) 0.083%  nebulizer solution INHALE CONTENTS OF 1 VIAL VIA NEBULIZER EVERY 6 HOURS AS NEEDED FOR WHEEZING/SHORTNESS OF BREATH 75 mL 1  . cetirizine (ZYRTEC) 10 MG tablet Take 10 mg by mouth daily as needed for allergies.    Marland Kitchen esomeprazole (NEXIUM) 40 MG capsule Take 40 mg by mouth every morning.  12  . latanoprost (XALATAN) 0.005 % ophthalmic solution Place 1 drop into both eyes at bedtime.     . lipase/protease/amylase (CREON) 12000 UNITS CPEP capsule Take 1 capsule (12,000 Units total) by mouth 3 (three) times daily with meals. 270 capsule 1  . metoprolol (LOPRESSOR) 50 MG tablet TAKE 1 TABLET (50 MG TOTAL) BY MOUTH 2 (TWO) TIMES DAILY. 180 tablet 3  . Multiple Vitamin (MULTIVITAMIN WITH MINERALS) TABS Take 1 tablet by mouth daily.    Glory Rosebush DELICA LANCETS 21H MISC TEST SUGAR DAILY  4  . ONETOUCH VERIO test strip CHECK SUGAR DAILY  12   No current facility-administered medications on file prior to visit.    Allergies  Allergen Reactions  . Penicillins Anaphylaxis, Hives and Swelling  . Azithromycin Other (See Comments)    pancreatitis  . Sulfa Antibiotics Hives and Rash   Social History   Social History  . Marital status: Married    Spouse name: N/A  . Number of children: N/A  . Years of education: N/A   Occupational History  . Teacher (retired)    Social History Main Topics  .  Smoking status: Never Smoker  . Smokeless tobacco: Never Used  . Alcohol use No  . Drug use: No  . Sexual activity: Yes   Other Topics Concern  . Not on file   Social History Narrative   Lives at home with husband.      Review of Systems  Constitutional: Positive for chills, diaphoresis and fever.  HENT: Negative.   Respiratory: Positive for cough and shortness of breath. Negative for chest tightness, wheezing and stridor.   Cardiovascular: Negative for chest pain, palpitations and leg swelling.  Gastrointestinal: Negative.   Genitourinary: Negative.        Objective:   Physical Exam    Constitutional: She appears well-developed and well-nourished. No distress.  HENT:  Head: Normocephalic and atraumatic.  Right Ear: External ear normal.  Left Ear: External ear normal.  Nose: Nose normal.  Mouth/Throat: Oropharynx is clear and moist. No oropharyngeal exudate.  Eyes: Conjunctivae are normal.  Neck: Neck supple.  Cardiovascular: Regular rhythm and normal heart sounds.  Tachycardia present.   Pulmonary/Chest: Effort normal and breath sounds normal. No respiratory distress. She has no wheezes. She has no rales. She exhibits no tenderness.  Abdominal: Soft. Bowel sounds are normal. She exhibits no distension. There is no tenderness. There is no rebound and no guarding.  Lymphadenopathy:    She has no cervical adenopathy.  Skin: No rash noted. She is not diaphoretic. No erythema.          Assessment & Plan:  Heart rate fast - Plan: EKG 12-Lead  Fever, unspecified fever cause - Plan: CBC with Differential/Platelet, COMPLETE METABOLIC PANEL WITH GFR, DG Chest 2 View, oseltamivir (TAMIFLU) 75 MG capsule, levofloxacin (LEVAQUIN) 750 MG tablet  I'm concerned the patient may have had viral influenza and may be developing community-acquired pneumonia although her exam today is unremarkable. I will cover her for influenza with Tamiflu 75 mg by mouth twice a day for 5 days to cover her for community-acquired pneumonia with Levaquin 750 mg by mouth daily for 7 days. I will. Her EKG today shows sinus tachycardia with ST segment depression IN THE INFEROLATERAL LEADS consistent with demand ischemia which I believe is due to cardiac strain. I gave the patient 800 mg ibuprofen to treat her fever and I'll have her come by here after her chest x-ray so that I can repeat her ekg.  if she continues to have a strain pattern, despite treating her fever and hopefully reducing her tachycardia, I will refer the patient to the emergency room  1:30 Chest x-ray revealed right middle lobe pneumonia.  Patient came back to the office. Fever has broken after ibuprofen. Tachycardia has improved. Repeat EKG shows improving downsloping ST depressions in the inferolateral leads improving that this was demand ischemia. If we can control her heart rate by controlling her fever we should prevent any further complications. The patient understands. Recheck tomorrow. Go to the emergency room if worse.

## 2017-03-06 NOTE — Addendum Note (Signed)
Addended by: Shary Decamp B on: 03/06/2017 11:33 AM   Modules accepted: Orders

## 2017-03-07 ENCOUNTER — Ambulatory Visit (INDEPENDENT_AMBULATORY_CARE_PROVIDER_SITE_OTHER): Payer: Medicare Other | Admitting: Family Medicine

## 2017-03-07 ENCOUNTER — Encounter: Payer: Self-pay | Admitting: Family Medicine

## 2017-03-07 VITALS — BP 136/64 | HR 100 | Temp 98.6°F | Resp 16 | Ht 66.0 in | Wt 144.0 lb

## 2017-03-07 DIAGNOSIS — J181 Lobar pneumonia, unspecified organism: Secondary | ICD-10-CM

## 2017-03-07 DIAGNOSIS — J189 Pneumonia, unspecified organism: Secondary | ICD-10-CM

## 2017-03-07 DIAGNOSIS — R000 Tachycardia, unspecified: Secondary | ICD-10-CM

## 2017-03-07 MED ORDER — HYDROCOD POLST-CPM POLST ER 10-8 MG/5ML PO SUER: 5.0000 mL | Freq: Two times a day (BID) | ORAL | 0 refills | Status: DC | PRN

## 2017-03-07 NOTE — Progress Notes (Signed)
Subjective:    Patient ID: Patricia [name], female    DOB: Jan 25, 1951, 66 y.o.   MRN: [id number]  HPI  03/06/17 Symptoms began last Wednesday with a dry nonproductive cough. She also reports a rattling sensation in her chest. She reports shortness of breath and chest congestion. She denies any hemoptysis. She denies any purulent sputum. She denies any ear pain, sinus pain, sore throat. She has a fever to 102.8. She feels extremely weak and tired. Her heart rate is extremely high 136 which could be reactive to the fever. She denies any chest pain or pressure but she does report some shortness of breath. Exam other than the tachycardia and the fever is unremarkable.  At that time, my plan was: I'm concerned the patient may have had viral influenza and may be developing community-acquired pneumonia although her exam today is unremarkable. I will cover her for influenza with Tamiflu 75 mg by mouth twice a day for 5 days to cover her for community-acquired pneumonia with Levaquin 750 mg by mouth daily for 7 days. I will. Her EKG today shows sinus tachycardia with ST segment depression IN THE INFEROLATERAL LEADS consistent with demand ischemia which I believe is due to cardiac strain. I gave the patient 800 mg ibuprofen to treat her fever and I'll have her come by here after her chest x-ray so that I can repeat her ekg.  if she continues to have a strain pattern, despite treating her fever and hopefully reducing her tachycardia, I will refer the patient to the emergency room  1:30 Chest x-ray revealed right middle lobe pneumonia. Patient came back to the office. Fever has broken after ibuprofen. Tachycardia has improved. Repeat EKG shows improving downsloping ST depressions in the inferolateral leads improving that this was demand ischemia. If we can control her heart rate by controlling her fever we should prevent any further complications. The patient understands. Recheck tomorrow. Go to the emergency room  if worse.  03/07/17 Patient feels basically the same as she fell yesterday. Thankfully her fever has been well-controlled with Tylenol and ibuprofen and in fact this morning she is afebrile. She is still slightly tachycardic at 100 bpm. However on her EKG today, the downsloping ST depression seen in the inferior lateral leads has improved dramatically and her EKG appears close to her baseline making the much more reassured. She continues to report cough is worse at night when she lies down. She continues to report fatigue fever and weakness and some mild shortness of breath. Past Medical History:  Diagnosis Date  . Atrial flutter (HCC)    Ablated 1998 Dr. Caryl Comes  . Breast cancer ([place])   . COPD (chronic obstructive pulmonary disease) ([place])   . Hodgkin's lymphoma ([place])    Treated with radiation and chemo  . Mitral regurgitation    pvc  . Osteoporosis    lumbar verterbral fracture, left ankle fracture, dexa 2015  . Pancreatitis   . Prediabetes   . Ulcer [facility])    Past Surgical History:  Procedure Laterality Date  . CARDIAC ELECTROPHYSIOLOGY [place] AND ABLATION  1999  . CATARACT EXTRACTION, BILATERAL    . COLONOSCOPY    . LAPAROTOMY    . MASTECTOMY  2005   Left-axillary  . ORIF ANKLE FRACTURE Left 03/21/2014   Procedure: LEFT ANKLE FRACTURE OPEN TREATMENT BILMALLEOLAR INCLUDES INTERNAL FIXATION ;  Surgeon: Renette Butters, MD;  Location: [place];  Service: Orthopedics;  Laterality: Left;  . 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 TEE WITHOUT CARDIOVERSION  06/12/2012   Procedure: TRANSESOPHAGEAL ECHOCARDIOGRAM (TEE);  Surgeon: Larey [name], MD;  Location: [place];  Service: Cardiovascular;  Laterality: N/A;  . TONSILLECTOMY AND ADENOIDECTOMY     Current Outpatient Prescriptions on File Prior to Visit  Medication Sig Dispense Refill  . albuterol (PROVENTIL) (2.5 MG/3ML) 0.083% nebulizer solution INHALE CONTENTS OF 1 VIAL VIA NEBULIZER EVERY 6 HOURS AS NEEDED FOR  WHEEZING/SHORTNESS OF BREATH 75 mL 1  . cetirizine (ZYRTEC) 10 MG tablet Take 10 mg by mouth daily as needed for allergies.    Marland Kitchen esomeprazole (NEXIUM) 40 MG capsule Take 40 mg by mouth every morning.  12  . latanoprost (XALATAN) 0.005 % ophthalmic solution Place 1 drop into both eyes at bedtime.     Marland Kitchen levofloxacin (LEVAQUIN) 750 MG tablet Take 1 tablet (750 mg total) by mouth daily. 7 tablet 0  . lipase/protease/amylase (CREON) 12000 UNITS CPEP capsule Take 1 capsule (12,000 Units total) by mouth 3 (three) times daily with meals. 270 capsule 1  . metoprolol (LOPRESSOR) 50 MG tablet TAKE 1 TABLET (50 MG TOTAL) BY MOUTH 2 (TWO) TIMES DAILY. 180 tablet 3  . Multiple Vitamin (MULTIVITAMIN WITH MINERALS) TABS Take 1 tablet by mouth daily.    Glory Rosebush DELICA LANCETS 50P MISC TEST SUGAR DAILY  4  . ONETOUCH VERIO test strip CHECK SUGAR DAILY  12  . oseltamivir (TAMIFLU) 75 MG capsule Take 1 capsule (75 mg total) by mouth 2 (two) times daily. (Patient not taking: Reported on 03/07/2017) 10 capsule 0   No current facility-administered medications on file prior to visit.    Allergies  Allergen Reactions  . Penicillins Anaphylaxis, Hives and Swelling  . Azithromycin Other (See Comments)    pancreatitis  . Sulfa Antibiotics Hives and Rash   Social History   Social History  . Marital status: Married    Spouse name: N/A  . Number of children: N/A  . Years of education: N/A   Occupational History  . Teacher (retired)    Social History Main Topics  . Smoking status: Never Smoker  . Smokeless tobacco: Never Used  . Alcohol use No  . Drug use: No  . Sexual activity: Yes   Other Topics Concern  . Not on file   Social History Narrative   Lives at home with husband.      Review of Systems  Constitutional: Positive for chills, diaphoresis and fever.  HENT: Negative.   Respiratory: Positive for cough and shortness of breath. Negative for chest tightness, wheezing and stridor.    Cardiovascular: Negative for chest pain, palpitations and leg swelling.  Gastrointestinal: Negative.   Genitourinary: Negative.        Objective:   Physical Exam  Constitutional: She appears well-developed and well-nourished. No distress.  HENT:  Head: Normocephalic and atraumatic.  Right Ear: External ear normal.  Left Ear: External ear normal.  Nose: Nose normal.  Mouth/Throat: Oropharynx is clear and moist. No oropharyngeal exudate.  Eyes: Conjunctivae are normal.  Neck: Neck supple.  Cardiovascular: Regular rhythm and normal heart sounds.  Tachycardia present.   Pulmonary/Chest: Effort normal. No respiratory distress. She has decreased breath sounds in the right lower field. She has no wheezes. She has rales.     She exhibits no tenderness.  Abdominal: Soft. Bowel sounds are normal. She exhibits no distension. There is no tenderness. There is no rebound and no guarding.  Lymphadenopathy:    She has no cervical adenopathy.  Skin: No rash noted. She is not diaphoretic. No erythema.          Assessment & Plan:  Rapid heart rate - Plan: EKG 12-Lead  Community acquired pneumonia of right middle lobe of lung ([place])  Clinically I think the patient looks better today than she did yesterday. Is still too early to tell. Continue to take Levaquin. Fortunately the cardiac strain seen on her EKG has resolved that we got her heart rate control as we have controlled her fever. Continue ibuprofen and Tylenol and recheck on Thursday. Call me immediately if symptoms worsen

## 2017-04-23 NOTE — Progress Notes (Signed)
HPI The patient presents for evaluation of mitral regurgitation. Transthoracic echo in the past demonstrated moderate to severe mitral regurgitation with mitral valve prolapse and moderate stenosis . However, followup TEE suggested the MR to be moderate. Echo in Jan suggested severe stenosis with moderate  MR and she also had moderate AI.  She does have some increased symptoms. She'll get short of breath walking to regard to stop. She compatible stationary bicycle for 10 minutes and has to stop. This might be slowly progressive. There's been some mild chest discomfort with activities. She has had some distant syncopal events but nothing recent. She has very rare brief palpitations but nothing like her previous atrial flutter. She is taking care of her husband who is dying of pancreatic cancer.    Allergies  Allergen Reactions  . Penicillins Anaphylaxis, Hives and Swelling  . Azithromycin Other (See Comments)    pancreatitis  . Sulfa Antibiotics Hives and Rash    Current Outpatient Prescriptions  Medication Sig Dispense Refill  . CALCIUM PO Take 2,000 mg by mouth daily.    . cetirizine (ZYRTEC) 10 MG tablet Take 10 mg by mouth daily as needed for allergies.    Marland Kitchen esomeprazole (NEXIUM) 40 MG capsule Take 40 mg by mouth every morning.  12  . latanoprost (XALATAN) 0.005 % ophthalmic solution Place 1 drop into both eyes at bedtime.     . lipase/protease/amylase (CREON) 12000 UNITS CPEP capsule Take 1 capsule (12,000 Units total) by mouth 3 (three) times daily with meals. 270 capsule 1  . metoprolol (LOPRESSOR) 50 MG tablet TAKE 1 TABLET (50 MG TOTAL) BY MOUTH 2 (TWO) TIMES DAILY. 180 tablet 3  . Multiple Vitamin (MULTIVITAMIN WITH MINERALS) TABS Take 1 tablet by mouth daily.    Glory Rosebush DELICA LANCETS 75T MISC TEST SUGAR DAILY  4  . ONETOUCH VERIO test strip CHECK SUGAR DAILY  12   No current facility-administered medications for this visit.     Past Medical History:  Diagnosis Date  .  Atrial flutter (HCC)    Ablated 1998 Dr. Caryl Comes  . Breast cancer ([place])   . COPD (chronic obstructive pulmonary disease) ([place])   . Hodgkin's lymphoma ([place])    Treated with radiation and chemo  . Mitral regurgitation    pvc  . Osteoporosis    lumbar verterbral fracture, left ankle fracture, dexa 2015  . Pancreatitis   . Prediabetes   . Ulcer     Past Surgical History:  Procedure Laterality Date  . CARDIAC ELECTROPHYSIOLOGY [place] AND ABLATION  1999  . CATARACT EXTRACTION, BILATERAL    . COLONOSCOPY    . LAPAROTOMY    . MASTECTOMY  2005   Left-axillary  . ORIF ANKLE FRACTURE Left 03/21/2014   Procedure: LEFT ANKLE FRACTURE OPEN TREATMENT BILMALLEOLAR INCLUDES INTERNAL FIXATION ;  Surgeon: Renette Butters, MD;  Location: [place];  Service: Orthopedics;  Laterality: Left;  . 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 TEE WITHOUT CARDIOVERSION  06/12/2012   Procedure: TRANSESOPHAGEAL ECHOCARDIOGRAM (TEE);  Surgeon: Larey [name], MD;  Location: [place];  Service: Cardiovascular;  Laterality: N/A;  . TONSILLECTOMY AND ADENOIDECTOMY       ROS:   As stated in the HPI and negative for all other systems.   PHYSICAL EXAM BP 133/63   Pulse 77   Ht 5\' 6"  (1.676 m)   Wt 136 lb 12.8 oz (62.1 kg)   SpO2 97%   BMI 22.08 kg/m  GENERAL:  Well appearing NECK:  Positive jugular venous distention, waveform within normal limits, carotid upstroke brisk and symmetric, no bruits, no thyromegaly LUNGS:  Clear to auscultation bilaterally BACK:  No CVA tenderness CHEST:  Unremarkable HEART:  PMI not displaced or sustained,S1 and S2 within normal limits, no S3, no S4, no clicks, no rubs, 3/6 holosystolic murmur best at left axilla, no diastolic murmurs ABD:  Flat, positive bowel sounds normal in frequency in pitch, no bruits, no rebound, no guarding, no midline pulsatile mass, no hepatomegaly, no splenomegaly EXT:  2 plus pulses throughout, no edema, no cyanosis no  clubbing   EKG:  NA  ASSESSMENT AND PLAN  MR/MS:   I think that this has progressed and she will need AVR/MVR soon.  However, she would like to wait a couple of months so she can continue to take care of her husband.  I will see her back in two months and then plan TEE, cath and surgical referral.  I have personally reviewed the echo films from Jan and looked back at several of the reports and reviewed these with the patient.   AI:  This was moderate.  She will need AVR as well.    PULMONARY HTN:  Pulmonary pressures are elevated.   This will be managed as above.    FLUTTER:  She has never had any of the symptoms consistent with her previous flutter. She's not on anticoagulation. I don't think this is indicated at this point.

## 2017-04-24 ENCOUNTER — Ambulatory Visit (INDEPENDENT_AMBULATORY_CARE_PROVIDER_SITE_OTHER): Payer: Medicare Other | Admitting: Cardiology

## 2017-04-24 ENCOUNTER — Encounter: Payer: Self-pay | Admitting: Cardiology

## 2017-04-24 VITALS — BP 133/63 | HR 77 | Ht 66.0 in | Wt 136.8 lb

## 2017-04-24 DIAGNOSIS — I050 Rheumatic mitral stenosis: Secondary | ICD-10-CM | POA: Diagnosis not present

## 2017-04-24 DIAGNOSIS — R0602 Shortness of breath: Secondary | ICD-10-CM

## 2017-04-24 DIAGNOSIS — I351 Nonrheumatic aortic (valve) insufficiency: Secondary | ICD-10-CM

## 2017-04-24 DIAGNOSIS — I340 Nonrheumatic mitral (valve) insufficiency: Secondary | ICD-10-CM | POA: Diagnosis not present

## 2017-04-24 NOTE — Patient Instructions (Signed)
Your physician recommends that you schedule a follow-up appointment in: 2 months with Dr. Hochrein  

## 2017-04-25 ENCOUNTER — Encounter: Payer: Self-pay | Admitting: Cardiology

## 2017-04-25 DIAGNOSIS — I351 Nonrheumatic aortic (valve) insufficiency: Secondary | ICD-10-CM | POA: Insufficient documentation

## 2017-04-25 DIAGNOSIS — I050 Rheumatic mitral stenosis: Secondary | ICD-10-CM | POA: Insufficient documentation

## 2017-04-27 ENCOUNTER — Ambulatory Visit: Payer: Medicare Other | Admitting: Cardiology

## 2017-06-22 NOTE — Progress Notes (Signed)
HPI The patient presents for evaluation of mitral regurgitation. Transthoracic echo in the past demonstrated moderate to severe mitral regurgitation with mitral valve prolapse and moderate stenosis . However, followup TEE suggested the MR to be moderate. Echo in Jan suggested severe stenosis with moderate  MR and she also had moderate AI.  She comes back to talk about this.   She says that she is ready now to have the evaluation for valve surgery.  She has some increased dyspnea.  She now can only pedal the stationary bike for 5 minutes before having SOB.  She has some mild ache in her left axilla.  She has been caring for her husband who is sick and dying of pancreatic cancer.  The patient denies any new symptoms such neck or arm discomfort. There has been no PND or orthopnea. There have been no reported palpitations, presyncope or syncope.     Allergies  Allergen Reactions  . Penicillins Anaphylaxis, Hives and Swelling    Has patient had a PCN reaction causing immediate rash, facial/tongue/throat swelling, SOB or lightheadedness with hypotension: Yes Has patient had a PCN reaction causing severe rash involving mucus membranes or skin necrosis: Yes Has patient had a PCN reaction that required hospitalization: No Has patient had a PCN reaction occurring within the last 10 years: No If all of the above answers are "NO", then may proceed with Cephalosporin use.   . Azithromycin Other (See Comments)    pancreatitis  . Other     Cheese - nausea and vomiting   . Sulfa Antibiotics Hives and Rash    Current Outpatient Prescriptions  Medication Sig Dispense Refill  . CALCIUM PO Take 2,000 mg by mouth daily.    . cetirizine (ZYRTEC) 10 MG tablet Take 10 mg by mouth daily as needed for allergies.    Marland Kitchen esomeprazole (NEXIUM) 40 MG capsule Take 40 mg by mouth every morning.  12  . latanoprost (XALATAN) 0.005 % ophthalmic solution Place 1 drop into both eyes at bedtime.     .  lipase/protease/amylase (CREON) 12000 UNITS CPEP capsule Take 1 capsule (12,000 Units total) by mouth 3 (three) times daily with meals. 270 capsule 1  . metoprolol (LOPRESSOR) 50 MG tablet TAKE 1 TABLET (50 MG TOTAL) BY MOUTH 2 (TWO) TIMES DAILY. 180 tablet 3  . Multiple Vitamin (MULTIVITAMIN WITH MINERALS) TABS Take 1 tablet by mouth daily.    Glory Rosebush DELICA LANCETS 66Q MISC TEST SUGAR DAILY  4  . ONETOUCH VERIO test strip CHECK SUGAR DAILY  12  . acetaminophen (TYLENOL) 500 MG tablet Take 1,000 mg by mouth 2 (two) times daily as needed for moderate pain or headache.    Marland Kitchen aspirin EC 81 MG tablet Take 81 mg by mouth 3 (three) times a week.    . chlorthalidone (HYGROTON) 25 MG tablet Take 1 tablet (25 mg total) by mouth daily. 90 tablet 3   No current facility-administered medications for this visit.     Past Medical History:  Diagnosis Date  . Atrial flutter (HCC)    Ablated 1998 Dr. Caryl Comes  . Breast cancer ([place])   . COPD (chronic obstructive pulmonary disease) ([place])   . Hodgkin's lymphoma ([place])    Treated with radiation and chemo  . Mitral regurgitation    pvc  . Osteoporosis    lumbar verterbral fracture, left ankle fracture, dexa 2015  . Pancreatitis   . Prediabetes   . Ulcer     Past Surgical History:  Procedure  Laterality Date  . CARDIAC ELECTROPHYSIOLOGY [place] AND ABLATION  1999  . CATARACT EXTRACTION, BILATERAL    . COLONOSCOPY    . LAPAROTOMY    . MASTECTOMY  2005   Left-axillary  . ORIF ANKLE FRACTURE Left 03/21/2014   Procedure: LEFT ANKLE FRACTURE OPEN TREATMENT BILMALLEOLAR INCLUDES INTERNAL FIXATION ;  Surgeon: Renette Butters, MD;  Location: [place];  Service: Orthopedics;  Laterality: Left;  . 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 TEE WITHOUT CARDIOVERSION  06/12/2012   Procedure: TRANSESOPHAGEAL ECHOCARDIOGRAM (TEE);  Surgeon: Larey [name], MD;  Location: [place];  Service: Cardiovascular;  Laterality: N/A;  . TONSILLECTOMY AND  ADENOIDECTOMY       ROS:   As stated in the HPI and negative for all other systems.   PHYSICAL EXAM BP (!) 152/75   Pulse 86   Ht 5\' 6"  (1.676 m)   Wt 138 lb 3.2 oz (62.7 kg)   SpO2 96%   BMI 22.31 kg/m   GENERAL:  Well appearing NECK:  Positive jugular venous distention, waveform within normal limits, carotid upstroke brisk and symmetric, no bruits, no thyromegaly LUNGS:  Clear to auscultation bilaterally BACK:  No CVA tenderness CHEST:  Unremarkable HEART:  PMI not displaced or sustained,S1 and S2 within normal limits, no S3, no S4, no clicks, no rubs, 3/6 holosystolic murmur at the axilla, left axially diastolic murmur.  Diastolic murmur at the left third intercostal space.   ABD:  Flat, positive bowel sounds normal in frequency in pitch, no bruits, no rebound, no guarding, no midline pulsatile mass, no hepatomegaly, no splenomegaly EXT:  2 plus pulses throughout, no edema, no cyanosis no clubbing   EKG:  NA  ASSESSMENT AND PLAN  MR/MS:  She now consents to work up for AVR/MVR.  She will be scheduled for TEE, right and left heart cath and surgical consultation.   The patient understands that risks included but are not limited to stroke (1 in 1000), death (1 in 72), kidney failure [usually temporary] (1 in 500), bleeding (1 in 200), allergic reaction [possibly serious] (1 in 200).  The patient understands and agrees to proceed.   AI:    This has been moderate and will be addresses as above.   PULMONARY HTN:    This went up significantly over serial echos and needs to be addressed with surgical intervention.  I would suspect that is post capillary and related to the mitral valve disease.    FLUTTER:  She has never had any of the symptoms consistent with her previous flutter.  She has had ablation.  She's not on anticoagulation.  HTN:  Her BP is increased and I am going to add Chlorthalidone.

## 2017-06-23 ENCOUNTER — Ambulatory Visit (INDEPENDENT_AMBULATORY_CARE_PROVIDER_SITE_OTHER): Payer: Medicare Other | Admitting: Cardiology

## 2017-06-23 ENCOUNTER — Encounter: Payer: Self-pay | Admitting: Cardiology

## 2017-06-23 VITALS — BP 152/75 | HR 86 | Ht 66.0 in | Wt 138.2 lb

## 2017-06-23 DIAGNOSIS — D689 Coagulation defect, unspecified: Secondary | ICD-10-CM | POA: Diagnosis not present

## 2017-06-23 DIAGNOSIS — I050 Rheumatic mitral stenosis: Secondary | ICD-10-CM | POA: Diagnosis not present

## 2017-06-23 DIAGNOSIS — I272 Pulmonary hypertension, unspecified: Secondary | ICD-10-CM

## 2017-06-23 DIAGNOSIS — Z01812 Encounter for preprocedural laboratory examination: Secondary | ICD-10-CM | POA: Diagnosis not present

## 2017-06-23 DIAGNOSIS — I340 Nonrheumatic mitral (valve) insufficiency: Secondary | ICD-10-CM | POA: Diagnosis not present

## 2017-06-23 MED ORDER — CHLORTHALIDONE 25 MG PO TABS: 25.0000 mg | ORAL_TABLET | Freq: Every day | ORAL | 3 refills | Status: DC

## 2017-06-23 MED ORDER — AMLODIPINE BESYLATE 2.5 MG PO TABS: 2.5000 mg | ORAL_TABLET | Freq: Every day | ORAL | 3 refills | Status: DC

## 2017-06-23 NOTE — Patient Instructions (Addendum)
Medication Instructions:  START- Chlorthalidone 25 mg daily  Labwork: Pre Op Labs   Testing/Procedures: Your physician has requested that you have a TEE. During a TEE, sound waves are used to create images of your heart. It provides your doctor with information about the size and shape of your heart and how well your heart's chambers and valves are working. In this test, a transducer is attached to the end of a flexible tube that's guided down your throat and into your esophagus (the tube leading from you mouth to your stomach) to get a more detailed image of your heart. You are not awake for the procedure. Please see the instruction sheet given to you today. For further information please visit [email or web address].  Your physician has requested that you have a Right and Left Heart Cardiac Catheterization. Cardiac catheterization is used to diagnose and/or treat various heart conditions. Doctors may recommend this procedure for a number of different reasons. The most common reason is to evaluate chest pain. Chest pain can be a symptom of coronary artery disease (CAD), and cardiac catheterization can show whether plaque is narrowing or blocking your heart's arteries. This procedure is also used to evaluate the valves, as well as measure the blood flow and oxygen levels in different parts of your heart. For further information please visit [email or web address]. Please follow instruction sheet, as given.    [place] [address] [place] [address] Dept: [phone] Loc: [place]  06/23/2017  You are scheduled for a Cardiac Catheterization on Tuesday, July 17 with Dr. Loralie Champagne.  1. Please arrive at the [facility] (Main Entrance A) at [facility]: [address] [place], [place] [address] at 5:30 AM (two hours before your procedure to ensure your preparation). Free valet  parking service is available.   Special note: Every effort is made to have your procedure done on time. Please understand that emergencies sometimes delay scheduled procedures.  2. Diet: Do not eat or drink anything after midnight prior to your procedure except sips of water to take medications.  3. Labs: You will need to have blood drawn on Friday, July 13 at [place], [place]  Open: [place] (Lunch 12:30 - 1:30)   Phone: [phone]. You do not need to be fasting.  4. Medication instructions in preparation for your procedure:    Current Outpatient Prescriptions (Cardiovascular):  .  metoprolol (LOPRESSOR) 50 MG tablet, TAKE 1 TABLET (50 MG TOTAL) BY MOUTH 2 (TWO) TIMES DAILY. .  chlorthalidone (HYGROTON) 25 MG tablet, Take 1 tablet (25 mg total) by mouth daily.  Current Outpatient Prescriptions (Respiratory):  .  cetirizine (ZYRTEC) 10 MG tablet, Take 10 mg by mouth daily as needed for allergies.    Current Outpatient Prescriptions (Other):  Marland Kitchen  CALCIUM PO, Take 2,000 mg by mouth daily. Marland Kitchen  esomeprazole (NEXIUM) 40 MG capsule, Take 40 mg by mouth every morning. .  latanoprost (XALATAN) 0.005 % ophthalmic solution, Place 1 drop into both eyes at bedtime.  .  lipase/protease/amylase (CREON) 12000 UNITS CPEP capsule, Take 1 capsule (12,000 Units total) by mouth 3 (three) times daily with meals. .  Multiple Vitamin (MULTIVITAMIN WITH MINERALS) TABS, Take 1 tablet by mouth daily. Glory Rosebush DELICA LANCETS 41L MISC, TEST SUGAR DAILY .  ONETOUCH VERIO test strip, CHECK SUGAR DAILY *For reference purposes while preparing patient instructions.   Delete this med  list prior to printing instructions for patient.*    On the morning of your procedure, take your medicine and any morning medicines NOT listed above.  You may use sips of water.  5. Plan for one night stay--bring personal belongings. 6. Bring a current list of your medications and current  insurance cards. 7. You MUST have a responsible person to drive you home. 8. Someone MUST be with you the first 24 hours after you arrive home or your discharge will be delayed. 9. Please wear clothes that are easy to get on and off and wear slip-on shoes.  Thank you for allowing [place] to care for you!   -- [facility]    Follow-Up: Your physician recommends that you schedule a follow-up appointment in: Dr Cyndia Bent   Any Other Special Instructions Will Be Listed Below (If Applicable).   If you need a refill on your cardiac medications before your next appointment, please call your pharmacy.

## 2017-06-24 LAB — COMPREHENSIVE METABOLIC PANEL
ALT: 25 IU/L (ref 0–32)
AST: 24 IU/L (ref 0–40)
Albumin/Globulin Ratio: 1.9 (ref 1.2–2.2)
Albumin: 4.5 g/dL (ref 3.6–4.8)
Alkaline Phosphatase: 74 IU/L (ref 39–117)
BUN/Creatinine Ratio: 21 (ref 12–28)
BUN: 21 mg/dL (ref 8–27)
Bilirubin Total: 0.4 mg/dL (ref 0.0–1.2)
CO2: 25 mmol/L (ref 20–29)
Calcium: 9.8 mg/dL (ref 8.7–10.3)
Chloride: 100 mmol/L (ref 96–106)
Creatinine, Ser: 1 mg/dL (ref 0.57–1.00)
GFR calc Af Amer: 68 mL/min/{1.73_m2} (ref >59)
GFR calc non Af Amer: 59 mL/min/{1.73_m2} — ABNORMAL LOW (ref >59)
Globulin, Total: 2.4 g/dL (ref 1.5–4.5)
Glucose: 103 mg/dL — ABNORMAL HIGH (ref 65–99)
Potassium: 5 mmol/L (ref 3.5–5.2)
Sodium: 141 mmol/L (ref 134–144)
Total Protein: 6.9 g/dL (ref 6.0–8.5)

## 2017-06-24 LAB — CBC
Hematocrit: 37.1 % (ref 34.0–46.6)
Hemoglobin: 12.6 g/dL (ref 11.1–15.9)
MCH: 29.7 pg (ref 26.6–33.0)
MCHC: 34 g/dL (ref 31.5–35.7)
MCV: 88 fL (ref 79–97)
Platelets: 482 10*3/uL — ABNORMAL HIGH (ref 150–379)
RBC: 4.24 x10E6/uL (ref 3.77–5.28)
RDW: 14.3 % (ref 12.3–15.4)
WBC: 7.7 10*3/uL (ref 3.4–10.8)

## 2017-06-24 LAB — PROTIME-INR
INR: 1 (ref 0.8–1.2)
Prothrombin Time: 10.4 s (ref 9.1–12.0)

## 2017-06-24 LAB — TSH: TSH: 2.79 u[IU]/mL (ref 0.450–4.500)

## 2017-06-24 LAB — APTT: aPTT: 25 s (ref 24–33)

## 2017-06-25 ENCOUNTER — Encounter: Payer: Self-pay | Admitting: Cardiology

## 2017-06-25 DIAGNOSIS — I272 Pulmonary hypertension, unspecified: Secondary | ICD-10-CM | POA: Insufficient documentation

## 2017-06-26 ENCOUNTER — Other Ambulatory Visit: Payer: Self-pay | Admitting: *Deleted

## 2017-06-26 DIAGNOSIS — Z01818 Encounter for other preprocedural examination: Secondary | ICD-10-CM

## 2017-06-27 ENCOUNTER — Encounter (HOSPITAL_COMMUNITY)
Admission: RE | Disposition: A | Discharge status: Home / Self Care | Payer: Self-pay | Source: Ambulatory Visit | Attending: Cardiology

## 2017-06-27 ENCOUNTER — Ambulatory Visit (HOSPITAL_COMMUNITY)
Admission: RE | Admit: 2017-06-27 | Discharge: 2017-06-27 | Disposition: A | Discharge status: Home / Self Care | Payer: Medicare Other | Source: Ambulatory Visit | Attending: Cardiology | Admitting: Cardiology

## 2017-06-27 ENCOUNTER — Encounter (HOSPITAL_COMMUNITY): Payer: Self-pay | Admitting: Cardiology

## 2017-06-27 DIAGNOSIS — I080 Rheumatic disorders of both mitral and aortic valves: Secondary | ICD-10-CM | POA: Diagnosis present

## 2017-06-27 DIAGNOSIS — Z882 Allergy status to sulfonamides status: Secondary | ICD-10-CM | POA: Insufficient documentation

## 2017-06-27 DIAGNOSIS — Z923 Personal history of irradiation: Secondary | ICD-10-CM | POA: Diagnosis not present

## 2017-06-27 DIAGNOSIS — Z7982 Long term (current) use of aspirin: Secondary | ICD-10-CM | POA: Diagnosis not present

## 2017-06-27 DIAGNOSIS — J449 Chronic obstructive pulmonary disease, unspecified: Secondary | ICD-10-CM | POA: Insufficient documentation

## 2017-06-27 DIAGNOSIS — Z8571 Personal history of Hodgkin lymphoma: Secondary | ICD-10-CM | POA: Insufficient documentation

## 2017-06-27 DIAGNOSIS — M810 Age-related osteoporosis without current pathological fracture: Secondary | ICD-10-CM | POA: Diagnosis not present

## 2017-06-27 DIAGNOSIS — I10 Essential (primary) hypertension: Secondary | ICD-10-CM | POA: Diagnosis not present

## 2017-06-27 DIAGNOSIS — R7303 Prediabetes: Secondary | ICD-10-CM | POA: Insufficient documentation

## 2017-06-27 DIAGNOSIS — I272 Pulmonary hypertension, unspecified: Secondary | ICD-10-CM | POA: Diagnosis not present

## 2017-06-27 DIAGNOSIS — Z880 Allergy status to penicillin: Secondary | ICD-10-CM | POA: Diagnosis not present

## 2017-06-27 DIAGNOSIS — I493 Ventricular premature depolarization: Secondary | ICD-10-CM | POA: Insufficient documentation

## 2017-06-27 DIAGNOSIS — I340 Nonrheumatic mitral (valve) insufficiency: Secondary | ICD-10-CM | POA: Diagnosis not present

## 2017-06-27 DIAGNOSIS — Z9221 Personal history of antineoplastic chemotherapy: Secondary | ICD-10-CM | POA: Diagnosis not present

## 2017-06-27 DIAGNOSIS — I351 Nonrheumatic aortic (valve) insufficiency: Secondary | ICD-10-CM | POA: Diagnosis not present

## 2017-06-27 DIAGNOSIS — Z01818 Encounter for other preprocedural examination: Secondary | ICD-10-CM

## 2017-06-27 HISTORY — PX: RIGHT/LEFT HEART CATH AND CORONARY ANGIOGRAPHY: CATH118266

## 2017-06-27 LAB — POCT I-STAT 3, ART BLOOD GAS (G3+)
Acid-Base Excess: 1 mmol/L (ref 0.0–2.0)
Acid-Base Excess: 2 mmol/L (ref 0.0–2.0)
Bicarbonate: 27.7 mmol/L (ref 20.0–28.0)
Bicarbonate: 28.2 mmol/L — ABNORMAL HIGH (ref 20.0–28.0)
O2 Saturation: 67 %
O2 Saturation: 68 %
TCO2: 29 mmol/L (ref 0–100)
TCO2: 30 mmol/L (ref 0–100)
pCO2 arterial: 51.7 mmHg — ABNORMAL HIGH (ref 32.0–48.0)
pCO2 arterial: 52.2 mmHg — ABNORMAL HIGH (ref 32.0–48.0)
pH, Arterial: 7.336 — ABNORMAL LOW (ref 7.350–7.450)
pH, Arterial: 7.341 — ABNORMAL LOW (ref 7.350–7.450)
pO2, Arterial: 38 mmHg — CL (ref 83.0–108.0)
pO2, Arterial: 38 mmHg — CL (ref 83.0–108.0)

## 2017-06-27 LAB — POCT ACTIVATED CLOTTING TIME: Activated Clotting Time: 153 seconds

## 2017-06-27 SURGERY — RIGHT/LEFT HEART CATH AND CORONARY ANGIOGRAPHY
Anesthesia: LOCAL

## 2017-06-27 MED ORDER — SODIUM CHLORIDE 0.9% FLUSH: 10.0000 mL | INTRAVENOUS | Status: DC | PRN

## 2017-06-27 MED ORDER — SODIUM CHLORIDE 0.9 % IV SOLN
INTRAVENOUS | Status: DC
  Administered 2017-06-27: 07:00:00 via INTRAVENOUS

## 2017-06-27 MED ORDER — SODIUM CHLORIDE 0.9% FLUSH: 3.0000 mL | INTRAVENOUS | Status: DC | PRN

## 2017-06-27 MED ORDER — HEPARIN (PORCINE) IN NACL 2-0.9 UNIT/ML-% IJ SOLN
INTRAMUSCULAR | Status: AC | PRN
  Administered 2017-06-27: 1000 mL via INTRA_ARTERIAL

## 2017-06-27 MED ORDER — MIDAZOLAM HCL 2 MG/2ML IJ SOLN
INTRAMUSCULAR | Status: AC
  Filled 2017-06-27: qty 2

## 2017-06-27 MED ORDER — SODIUM CHLORIDE 0.9 % IV SOLN
INTRAVENOUS | Status: AC | PRN
  Administered 2017-06-27: 250 mL via INTRAVENOUS

## 2017-06-27 MED ORDER — MIDAZOLAM HCL 2 MG/2ML IJ SOLN
INTRAMUSCULAR | Status: DC | PRN
  Administered 2017-06-27 (×2): 1 mg via INTRAVENOUS

## 2017-06-27 MED ORDER — HEPARIN SODIUM (PORCINE) 1000 UNIT/ML IJ SOLN
INTRAMUSCULAR | Status: DC | PRN
  Administered 2017-06-27: 3000 [IU] via INTRAVENOUS

## 2017-06-27 MED ORDER — HEPARIN SODIUM (PORCINE) 1000 UNIT/ML IJ SOLN
INTRAMUSCULAR | Status: AC
  Filled 2017-06-27: qty 1

## 2017-06-27 MED ORDER — IOPAMIDOL (ISOVUE-370) INJECTION 76%
INTRAVENOUS | Status: DC | PRN
  Administered 2017-06-27: 65 mL via INTRA_ARTERIAL

## 2017-06-27 MED ORDER — SODIUM CHLORIDE 0.9 % IV SOLN: 250.0000 mL | INTRAVENOUS | Status: DC | PRN

## 2017-06-27 MED ORDER — FENTANYL CITRATE (PF) 100 MCG/2ML IJ SOLN
INTRAMUSCULAR | Status: AC
  Filled 2017-06-27: qty 2

## 2017-06-27 MED ORDER — VERAPAMIL HCL 2.5 MG/ML IV SOLN
INTRAVENOUS | Status: AC
  Filled 2017-06-27: qty 2

## 2017-06-27 MED ORDER — HEPARIN (PORCINE) IN NACL 2-0.9 UNIT/ML-% IJ SOLN
INTRAMUSCULAR | Status: AC
  Filled 2017-06-27: qty 1000

## 2017-06-27 MED ORDER — ASPIRIN 81 MG PO CHEW
CHEWABLE_TABLET | ORAL | Status: AC
  Administered 2017-06-27: 81 mg via ORAL
  Filled 2017-06-27: qty 1

## 2017-06-27 MED ORDER — LIDOCAINE HCL (PF) 1 % IJ SOLN
INTRAMUSCULAR | Status: AC
  Filled 2017-06-27: qty 30

## 2017-06-27 MED ORDER — NITROGLYCERIN 1 MG/10 ML FOR IR/CATH LAB
INTRA_ARTERIAL | Status: DC | PRN
  Administered 2017-06-27: 200 ug via INTRA_ARTERIAL

## 2017-06-27 MED ORDER — SODIUM CHLORIDE 0.9 % IV SOLN: INTRAVENOUS | Status: DC

## 2017-06-27 MED ORDER — ONDANSETRON HCL 4 MG/2ML IJ SOLN
4.0000 mg | Freq: Four times a day (QID) | INTRAMUSCULAR | Status: DC | PRN
Start: 2017-06-27 — End: 2017-06-27

## 2017-06-27 MED ORDER — IOPAMIDOL (ISOVUE-370) INJECTION 76%
INTRAVENOUS | Status: AC
  Filled 2017-06-27: qty 100

## 2017-06-27 MED ORDER — NITROGLYCERIN 1 MG/10 ML FOR IR/CATH LAB
INTRA_ARTERIAL | Status: AC
  Filled 2017-06-27: qty 10

## 2017-06-27 MED ORDER — ACETAMINOPHEN 325 MG PO TABS
650.0000 mg | ORAL_TABLET | ORAL | Status: DC | PRN
Start: 2017-06-27 — End: 2017-06-27

## 2017-06-27 MED ORDER — FENTANYL CITRATE (PF) 100 MCG/2ML IJ SOLN
INTRAMUSCULAR | Status: DC | PRN
  Administered 2017-06-27 (×2): 25 ug via INTRAVENOUS

## 2017-06-27 MED ORDER — ASPIRIN 81 MG PO CHEW
81.0000 mg | CHEWABLE_TABLET | ORAL | Status: AC
  Administered 2017-06-27: 81 mg via ORAL

## 2017-06-27 MED ORDER — SODIUM CHLORIDE 0.9% FLUSH: 3.0000 mL | Freq: Two times a day (BID) | INTRAVENOUS | Status: DC

## 2017-06-27 MED ORDER — HEPARIN (PORCINE) IN NACL 2-0.9 UNIT/ML-% IJ SOLN
INTRAMUSCULAR | Status: DC | PRN
  Administered 2017-06-27: 08:00:00 via INTRA_ARTERIAL

## 2017-06-27 MED ORDER — LIDOCAINE HCL (PF) 1 % IJ SOLN
INTRAMUSCULAR | Status: DC | PRN
  Administered 2017-06-27: 2 mL via INTRADERMAL
  Administered 2017-06-27: 18 mL via INTRADERMAL

## 2017-06-27 SURGICAL SUPPLY — 13 items
CATH 5FR JL3.5 JR4 ANG PIG MP (CATHETERS) ×1 IMPLANT
CATH SWAN GANZ 7F STRAIGHT (CATHETERS) ×1 IMPLANT
DEVICE RAD COMP TR BAND LRG (VASCULAR PRODUCTS) ×1 IMPLANT
GLIDESHEATH SLEND SS 6F .021 (SHEATH) ×1 IMPLANT
GUIDEWIRE INQWIRE 1.5J.035X260 (WIRE) IMPLANT
INQWIRE 1.5J .035X260CM (WIRE) ×2
KIT HEART LEFT (KITS) ×2 IMPLANT
PACK CARDIAC CATHETERIZATION (CUSTOM PROCEDURE TRAY) ×2 IMPLANT
SHEATH PINNACLE 7F 10CM (SHEATH) ×1 IMPLANT
SYR MEDRAD MARK V 150ML (SYRINGE) ×2 IMPLANT
TRANSDUCER W/STOPCOCK (MISCELLANEOUS) ×2 IMPLANT
TUBING CIL FLEX 10 FLL-RA (TUBING) ×2 IMPLANT
WIRE HI TORQ VERSACORE-J 145CM (WIRE) ×1 IMPLANT

## 2017-06-27 NOTE — Discharge Instructions (Signed)
Radial Site Care °Refer to this sheet in the next few weeks. These instructions provide you with information about caring for yourself after your procedure. Your health care provider may also give you more specific instructions. Your treatment has been planned according to current medical practices, but problems sometimes occur. Call your health care provider if you have any problems or questions after your procedure. °What can I expect after the procedure? °After your procedure, it is typical to have the following: °· Bruising at the radial site that usually fades within 1-2 weeks. °· Blood collecting in the tissue (hematoma) that may be painful to the touch. It should usually decrease in size and tenderness within 1-2 weeks. ° °Follow these instructions at home: °· Take medicines only as directed by your health care provider. °· You may shower 24-48 hours after the procedure or as directed by your health care provider. Remove the bandage (dressing) and gently wash the site with plain soap and water. Pat the area dry with a clean towel. Do not rub the site, because this may cause bleeding. °· Do not take baths, swim, or use a hot tub until your health care provider approves. °· Check your insertion site every day for redness, swelling, or drainage. °· Do not apply powder or lotion to the site. °· Do not flex or bend the affected arm for 24 hours or as directed by your health care provider. °· Do not push or pull heavy objects with the affected arm for 24 hours or as directed by your health care provider. °· Do not lift over 10 lb (4.5 kg) for 5 days after your procedure or as directed by your health care provider. °· Ask your health care provider when it is okay to: °? Return to work or school. °? Resume usual physical activities or sports. °? Resume sexual activity. °· Do not drive home if you are discharged the same day as the procedure. Have someone else drive you. °· You may drive 24 hours after the procedure  unless otherwise instructed by your health care provider. °· Do not operate machinery or power tools for 24 hours after the procedure. °· If your procedure was done as an outpatient procedure, which means that you went home the same day as your procedure, a responsible adult should be with you for the first 24 hours after you arrive home. °· Keep all follow-up visits as directed by your health care provider. This is important. °Contact a health care provider if: °· You have a fever. °· You have chills. °· You have increased bleeding from the radial site. Hold pressure on the site. °Get help right away if: °· You have unusual pain at the radial site. °· You have redness, warmth, or swelling at the radial site. °· You have drainage (other than a small amount of blood on the dressing) from the radial site. °· The radial site is bleeding, and the bleeding does not stop after 30 minutes of holding steady pressure on the site. °· Your arm or hand becomes pale, cool, tingly, or numb. °This information is not intended to replace advice given to you by your health care provider. Make sure you discuss any questions you have with your health care provider. °Document Released: 12/31/2010 Document Revised: 05/05/2016 Document Reviewed: 06/16/2014 °Elsevier Interactive Patient Education © 2018 [employer] °Femoral Site Care °Refer to this sheet in the next few weeks. These instructions provide you with information about caring for yourself after your procedure. Your   health care provider may also give you more specific instructions. Your treatment has been planned according to current medical practices, but problems sometimes occur. Call your health care provider if you have any problems or questions after your procedure. °What can I expect after the procedure? °After your procedure, it is typical to have the following: °· Bruising at the site that usually fades within 1-2 weeks. °· Blood collecting in the tissue (hematoma) that  may be painful to the touch. It should usually decrease in size and tenderness within 1-2 weeks. ° °Follow these instructions at home: °· Take medicines only as directed by your health care provider. °· You may shower 24-48 hours after the procedure or as directed by your health care provider. Remove the bandage (dressing) and gently wash the site with plain soap and water. Pat the area dry with a clean towel. Do not rub the site, because this may cause bleeding. °· Do not take baths, swim, or use a hot tub until your health care provider approves. °· Check your insertion site every day for redness, swelling, or drainage. °· Do not apply powder or lotion to the site. °· Limit use of stairs to twice a day for the first 2-3 days or as directed by your health care provider. °· Do not squat for the first 2-3 days or as directed by your health care provider. °· Do not lift over 10 lb (4.5 kg) for 5 days after your procedure or as directed by your health care provider. °· Ask your health care provider when it is okay to: °? Return to work or school. °? Resume usual physical activities or sports. °? Resume sexual activity. °· Do not drive home if you are discharged the same day as the procedure. Have someone else drive you. °· You may drive 24 hours after the procedure unless otherwise instructed by your health care provider. °· Do not operate machinery or power tools for 24 hours after the procedure or as directed by your health care provider. °· If your procedure was done as an outpatient procedure, which means that you went home the same day as your procedure, a responsible adult should be with you for the first 24 hours after you arrive home. °· Keep all follow-up visits as directed by your health care provider. This is important. °Contact a health care provider if: °· You have a fever. °· You have chills. °· You have increased bleeding from the site. Hold pressure on the site. °Get help right away if: °· You have  unusual pain at the site. °· You have redness, warmth, or swelling at the site. °· You have drainage (other than a small amount of blood on the dressing) from the site. °· The site is bleeding, and the bleeding does not stop after 30 minutes of holding steady pressure on the site. °· Your leg or foot becomes pale, cool, tingly, or numb. °This information is not intended to replace advice given to you by your health care provider. Make sure you discuss any questions you have with your health care provider. °Document Released: 08/01/2014 Document Revised: 05/05/2016 Document Reviewed: 06/17/2014 °Elsevier Interactive Patient Education © 2018 Else[employer] Inc[employer] ° °

## 2017-06-27 NOTE — H&P (View-Only) (Signed)
HPI The patient presents for evaluation of mitral regurgitation. Transthoracic echo in the past demonstrated moderate to severe mitral regurgitation with mitral valve prolapse and moderate stenosis . However, followup TEE suggested the MR to be moderate. Echo in Jan suggested severe stenosis with moderate  MR and she also had moderate AI.  She comes back to talk about this.   She says that she is ready now to have the evaluation for valve surgery.  She has some increased dyspnea.  She now can only pedal the stationary bike for 5 minutes before having SOB.  She has some mild ache in her left axilla.  She has been caring for her husband who is sick and dying of pancreatic cancer.  The patient denies any new symptoms such neck or arm discomfort. There has been no PND or orthopnea. There have been no reported palpitations, presyncope or syncope.     Allergies  Allergen Reactions  . Penicillins Anaphylaxis, Hives and Swelling    Has patient had a PCN reaction causing immediate rash, facial/tongue/throat swelling, SOB or lightheadedness with hypotension: Yes Has patient had a PCN reaction causing severe rash involving mucus membranes or skin necrosis: Yes Has patient had a PCN reaction that required hospitalization: No Has patient had a PCN reaction occurring within the last 10 years: No If all of the above answers are "NO", then may proceed with Cephalosporin use.   . Azithromycin Other (See Comments)    pancreatitis  . Other     Cheese - nausea and vomiting   . Sulfa Antibiotics Hives and Rash    Current Outpatient Prescriptions  Medication Sig Dispense Refill  . CALCIUM PO Take 2,000 mg by mouth daily.    . cetirizine (ZYRTEC) 10 MG tablet Take 10 mg by mouth daily as needed for allergies.    Marland Kitchen esomeprazole (NEXIUM) 40 MG capsule Take 40 mg by mouth every morning.  12  . latanoprost (XALATAN) 0.005 % ophthalmic solution Place 1 drop into both eyes at bedtime.     .  lipase/protease/amylase (CREON) 12000 UNITS CPEP capsule Take 1 capsule (12,000 Units total) by mouth 3 (three) times daily with meals. 270 capsule 1  . metoprolol (LOPRESSOR) 50 MG tablet TAKE 1 TABLET (50 MG TOTAL) BY MOUTH 2 (TWO) TIMES DAILY. 180 tablet 3  . Multiple Vitamin (MULTIVITAMIN WITH MINERALS) TABS Take 1 tablet by mouth daily.    Glory Rosebush DELICA LANCETS 85U MISC TEST SUGAR DAILY  4  . ONETOUCH VERIO test strip CHECK SUGAR DAILY  12  . acetaminophen (TYLENOL) 500 MG tablet Take 1,000 mg by mouth 2 (two) times daily as needed for moderate pain or headache.    Marland Kitchen aspirin EC 81 MG tablet Take 81 mg by mouth 3 (three) times a week.    . chlorthalidone (HYGROTON) 25 MG tablet Take 1 tablet (25 mg total) by mouth daily. 90 tablet 3   No current facility-administered medications for this visit.     Past Medical History:  Diagnosis Date  . Atrial flutter (HCC)    Ablated 1998 Dr. Caryl Comes  . Breast cancer ([place])   . COPD (chronic obstructive pulmonary disease) ([place])   . Hodgkin's lymphoma ([place])    Treated with radiation and chemo  . Mitral regurgitation    pvc  . Osteoporosis    lumbar verterbral fracture, left ankle fracture, dexa 2015  . Pancreatitis   . Prediabetes   . Ulcer     Past Surgical History:  Procedure  Laterality Date  . CARDIAC ELECTROPHYSIOLOGY [place] AND ABLATION  1999  . CATARACT EXTRACTION, BILATERAL    . COLONOSCOPY    . LAPAROTOMY    . MASTECTOMY  2005   Left-axillary  . ORIF ANKLE FRACTURE Left 03/21/2014   Procedure: LEFT ANKLE FRACTURE OPEN TREATMENT BILMALLEOLAR INCLUDES INTERNAL FIXATION ;  Surgeon: Renette Butters, MD;  Location: [facility];  Service: Orthopedics;  Laterality: Left;  . 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 TEE WITHOUT CARDIOVERSION  06/12/2012   Procedure: TRANSESOPHAGEAL ECHOCARDIOGRAM (TEE);  Surgeon: Larey [name], MD;  Location: [place];  Service: Cardiovascular;  Laterality: N/A;  . TONSILLECTOMY AND  ADENOIDECTOMY       ROS:   As stated in the HPI and negative for all other systems.   PHYSICAL EXAM BP (!) 152/75   Pulse 86   Ht 5\' 6"  (1.676 m)   Wt 138 lb 3.2 oz (62.7 kg)   SpO2 96%   BMI 22.31 kg/m   GENERAL:  Well appearing NECK:  Positive jugular venous distention, waveform within normal limits, carotid upstroke brisk and symmetric, no bruits, no thyromegaly LUNGS:  Clear to auscultation bilaterally BACK:  No CVA tenderness CHEST:  Unremarkable HEART:  PMI not displaced or sustained,S1 and S2 within normal limits, no S3, no S4, no clicks, no rubs, 3/6 holosystolic murmur at the axilla, left axially diastolic murmur.  Diastolic murmur at the left third intercostal space.   ABD:  Flat, positive bowel sounds normal in frequency in pitch, no bruits, no rebound, no guarding, no midline pulsatile mass, no hepatomegaly, no splenomegaly EXT:  2 plus pulses throughout, no edema, no cyanosis no clubbing   EKG:  NA  ASSESSMENT AND PLAN  MR/MS:  She now consents to work up for AVR/MVR.  She will be scheduled for TEE, right and left heart cath and surgical consultation.   The patient understands that risks included but are not limited to stroke (1 in 1000), death (1 in 37), kidney failure [usually temporary] (1 in 500), bleeding (1 in 200), allergic reaction [possibly serious] (1 in 200).  The patient understands and agrees to proceed.   AI:    This has been moderate and will be addresses as above.   PULMONARY HTN:    This went up significantly over serial echos and needs to be addressed with surgical intervention.  I would suspect that is post capillary and related to the mitral valve disease.    FLUTTER:  She has never had any of the symptoms consistent with her previous flutter.  She has had ablation.  She's not on anticoagulation.  HTN:  Her BP is increased and I am going to add Chlorthalidone.

## 2017-06-27 NOTE — Progress Notes (Signed)
Patients 6 Fr right femoral venous sheath was pulled and manual pressure was held for at least 15 minutes. During and post sheath pull patient had no complaints and no pain. Sheath pull site was clean, dry, and intact with no signs of hematoma or bleeding. A gauze and tegaderm dressing was applied and is clean, dry, and intact. Vascular site assessment remained a level 0. Patients VS were WNL's during the sheath pull. Patient was educated on post sheath pull site management. Bedrest began at 0927 and ended at 1127 and will be for 2 hours. Will continue to monitor patient at this time.

## 2017-06-27 NOTE — Interval H&P Note (Signed)
History and Physical Interval Note:  06/27/2017 7:41 AM  Patricia [name]  has presented today for surgery, with the diagnosis of mitro value reg  The various methods of treatment have been discussed with the patient and family. After consideration of risks, benefits and other options for treatment, the patient has consented to  Procedure(s): Right/Left Heart Cath and Coronary Angiography (N/A) as a surgical intervention .  The patient's history has been reviewed, patient examined, no change in status, stable for surgery.  I have reviewed the patient's chart and labs.  Questions were answered to the patient's satisfaction.     Patricia [name] [employer]

## 2017-06-28 ENCOUNTER — Other Ambulatory Visit: Payer: Self-pay | Admitting: *Deleted

## 2017-06-28 DIAGNOSIS — Z01818 Encounter for other preprocedural examination: Secondary | ICD-10-CM

## 2017-06-29 ENCOUNTER — Ambulatory Visit (HOSPITAL_BASED_OUTPATIENT_CLINIC_OR_DEPARTMENT_OTHER): Payer: Medicare Other

## 2017-06-29 ENCOUNTER — Ambulatory Visit (HOSPITAL_COMMUNITY)
Admission: RE | Admit: 2017-06-29 | Discharge: 2017-06-29 | Disposition: A | Discharge status: Home / Self Care | Payer: Medicare Other | Source: Ambulatory Visit | Attending: Cardiology | Admitting: Cardiology

## 2017-06-29 ENCOUNTER — Encounter (HOSPITAL_COMMUNITY)
Admission: RE | Disposition: A | Discharge status: Home / Self Care | Payer: Self-pay | Source: Ambulatory Visit | Attending: Cardiology

## 2017-06-29 ENCOUNTER — Encounter (HOSPITAL_COMMUNITY): Payer: Self-pay | Admitting: *Deleted

## 2017-06-29 DIAGNOSIS — Z882 Allergy status to sulfonamides status: Secondary | ICD-10-CM | POA: Insufficient documentation

## 2017-06-29 DIAGNOSIS — I080 Rheumatic disorders of both mitral and aortic valves: Secondary | ICD-10-CM | POA: Insufficient documentation

## 2017-06-29 DIAGNOSIS — Z880 Allergy status to penicillin: Secondary | ICD-10-CM | POA: Diagnosis not present

## 2017-06-29 DIAGNOSIS — Z8571 Personal history of Hodgkin lymphoma: Secondary | ICD-10-CM | POA: Insufficient documentation

## 2017-06-29 DIAGNOSIS — I272 Pulmonary hypertension, unspecified: Secondary | ICD-10-CM | POA: Diagnosis not present

## 2017-06-29 DIAGNOSIS — M810 Age-related osteoporosis without current pathological fracture: Secondary | ICD-10-CM | POA: Insufficient documentation

## 2017-06-29 DIAGNOSIS — I340 Nonrheumatic mitral (valve) insufficiency: Secondary | ICD-10-CM

## 2017-06-29 DIAGNOSIS — J449 Chronic obstructive pulmonary disease, unspecified: Secondary | ICD-10-CM | POA: Insufficient documentation

## 2017-06-29 DIAGNOSIS — Z7982 Long term (current) use of aspirin: Secondary | ICD-10-CM | POA: Diagnosis not present

## 2017-06-29 DIAGNOSIS — I10 Essential (primary) hypertension: Secondary | ICD-10-CM | POA: Insufficient documentation

## 2017-06-29 DIAGNOSIS — I351 Nonrheumatic aortic (valve) insufficiency: Secondary | ICD-10-CM

## 2017-06-29 DIAGNOSIS — R7303 Prediabetes: Secondary | ICD-10-CM | POA: Insufficient documentation

## 2017-06-29 DIAGNOSIS — Z853 Personal history of malignant neoplasm of breast: Secondary | ICD-10-CM | POA: Diagnosis not present

## 2017-06-29 DIAGNOSIS — Z01818 Encounter for other preprocedural examination: Secondary | ICD-10-CM

## 2017-06-29 HISTORY — PX: TEE WITHOUT CARDIOVERSION: SHX5443

## 2017-06-29 LAB — ECHO TEE
Area-P 1/2: 2.72 cm2
MV Annulus VTI: 46.2 cm
MV M vel: 116
Mean grad: 6 mmHg
P 1/2 time: 78 ms
PISA EROA: 0.1 cm2
VTI: 181 cm

## 2017-06-29 SURGERY — ECHOCARDIOGRAM, TRANSESOPHAGEAL
Anesthesia: Moderate Sedation

## 2017-06-29 MED ORDER — FENTANYL CITRATE (PF) 100 MCG/2ML IJ SOLN
INTRAMUSCULAR | Status: DC | PRN
  Administered 2017-06-29 (×2): 25 ug via INTRAVENOUS

## 2017-06-29 MED ORDER — FENTANYL CITRATE (PF) 100 MCG/2ML IJ SOLN
INTRAMUSCULAR | Status: AC
Start: 2017-06-29 — End: 2017-06-29
  Filled 2017-06-29: qty 2

## 2017-06-29 MED ORDER — MIDAZOLAM HCL 10 MG/2ML IJ SOLN
INTRAMUSCULAR | Status: DC | PRN
  Administered 2017-06-29 (×2): 2 mg via INTRAVENOUS

## 2017-06-29 MED ORDER — BUTAMBEN-TETRACAINE-BENZOCAINE 2-2-14 % EX AERO
INHALATION_SPRAY | CUTANEOUS | Status: DC | PRN
  Administered 2017-06-29: 2 via TOPICAL

## 2017-06-29 MED ORDER — MIDAZOLAM HCL 5 MG/ML IJ SOLN
INTRAMUSCULAR | Status: AC
  Filled 2017-06-29: qty 2

## 2017-06-29 MED ORDER — SODIUM CHLORIDE 0.9 % IV SOLN
INTRAVENOUS | Status: DC
  Administered 2017-06-29: 08:00:00 via INTRAVENOUS

## 2017-06-29 NOTE — H&P (View-Only) (Signed)
HPI The patient presents for evaluation of mitral regurgitation. Transthoracic echo in the past demonstrated moderate to severe mitral regurgitation with mitral valve prolapse and moderate stenosis . However, followup TEE suggested the MR to be moderate. Echo in Jan suggested severe stenosis with moderate  MR and she also had moderate AI.  She comes back to talk about this.   She says that she is ready now to have the evaluation for valve surgery.  She has some increased dyspnea.  She now can only pedal the stationary bike for 5 minutes before having SOB.  She has some mild ache in her left axilla.  She has been caring for her husband who is sick and dying of pancreatic cancer.  The patient denies any new symptoms such neck or arm discomfort. There has been no PND or orthopnea. There have been no reported palpitations, presyncope or syncope.     Allergies  Allergen Reactions  . Penicillins Anaphylaxis, Hives and Swelling    Has patient had a PCN reaction causing immediate rash, facial/tongue/throat swelling, SOB or lightheadedness with hypotension: Yes Has patient had a PCN reaction causing severe rash involving mucus membranes or skin necrosis: Yes Has patient had a PCN reaction that required hospitalization: No Has patient had a PCN reaction occurring within the last 10 years: No If all of the above answers are "NO", then may proceed with Cephalosporin use.   . Azithromycin Other (See Comments)    pancreatitis  . Other     Cheese - nausea and vomiting   . Sulfa Antibiotics Hives and Rash    Current Outpatient Prescriptions  Medication Sig Dispense Refill  . CALCIUM PO Take 2,000 mg by mouth daily.    . cetirizine (ZYRTEC) 10 MG tablet Take 10 mg by mouth daily as needed for allergies.    Marland Kitchen esomeprazole (NEXIUM) 40 MG capsule Take 40 mg by mouth every morning.  12  . latanoprost (XALATAN) 0.005 % ophthalmic solution Place 1 drop into both eyes at bedtime.     .  lipase/protease/amylase (CREON) 12000 UNITS CPEP capsule Take 1 capsule (12,000 Units total) by mouth 3 (three) times daily with meals. 270 capsule 1  . metoprolol (LOPRESSOR) 50 MG tablet TAKE 1 TABLET (50 MG TOTAL) BY MOUTH 2 (TWO) TIMES DAILY. 180 tablet 3  . Multiple Vitamin (MULTIVITAMIN WITH MINERALS) TABS Take 1 tablet by mouth daily.    Glory Rosebush DELICA LANCETS 54T MISC TEST SUGAR DAILY  4  . ONETOUCH VERIO test strip CHECK SUGAR DAILY  12  . acetaminophen (TYLENOL) 500 MG tablet Take 1,000 mg by mouth 2 (two) times daily as needed for moderate pain or headache.    Marland Kitchen aspirin EC 81 MG tablet Take 81 mg by mouth 3 (three) times a week.    . chlorthalidone (HYGROTON) 25 MG tablet Take 1 tablet (25 mg total) by mouth daily. 90 tablet 3   No current facility-administered medications for this visit.     Past Medical History:  Diagnosis Date  . Atrial flutter (HCC)    Ablated 1998 Dr. Caryl Comes  . Breast cancer ([place])   . COPD (chronic obstructive pulmonary disease) ([place])   . Hodgkin's lymphoma ([place])    Treated with radiation and chemo  . Mitral regurgitation    pvc  . Osteoporosis    lumbar verterbral fracture, left ankle fracture, dexa 2015  . Pancreatitis   . Prediabetes   . Ulcer     Past Surgical History:  Procedure  Laterality Date  . CARDIAC ELECTROPHYSIOLOGY [place] AND ABLATION  1999  . CATARACT EXTRACTION, BILATERAL    . COLONOSCOPY    . LAPAROTOMY    . MASTECTOMY  2005   Left-axillary  . ORIF ANKLE FRACTURE Left 03/21/2014   Procedure: LEFT ANKLE FRACTURE OPEN TREATMENT BILMALLEOLAR INCLUDES INTERNAL FIXATION ;  Surgeon: Renette Butters, MD;  Location: [facility];  Service: Orthopedics;  Laterality: Left;  . 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 TEE WITHOUT CARDIOVERSION  06/12/2012   Procedure: TRANSESOPHAGEAL ECHOCARDIOGRAM (TEE);  Surgeon: Larey [name], MD;  Location: [place] [facility];  Service: Cardiovascular;  Laterality: N/A;  . TONSILLECTOMY AND  ADENOIDECTOMY       ROS:   As stated in the HPI and negative for all other systems.   PHYSICAL EXAM BP (!) 152/75   Pulse 86   Ht 5\' 6"  (1.676 m)   Wt 138 lb 3.2 oz (62.7 kg)   SpO2 96%   BMI 22.31 kg/m   GENERAL:  Well appearing NECK:  Positive jugular venous distention, waveform within normal limits, carotid upstroke brisk and symmetric, no bruits, no thyromegaly LUNGS:  Clear to auscultation bilaterally BACK:  No CVA tenderness CHEST:  Unremarkable HEART:  PMI not displaced or sustained,S1 and S2 within normal limits, no S3, no S4, no clicks, no rubs, 3/6 holosystolic murmur at the axilla, left axially diastolic murmur.  Diastolic murmur at the left third intercostal space.   ABD:  Flat, positive bowel sounds normal in frequency in pitch, no bruits, no rebound, no guarding, no midline pulsatile mass, no hepatomegaly, no splenomegaly EXT:  2 plus pulses throughout, no edema, no cyanosis no clubbing   EKG:  NA  ASSESSMENT AND PLAN  MR/MS:  She now consents to work up for AVR/MVR.  She will be scheduled for TEE, right and left heart cath and surgical consultation.   The patient understands that risks included but are not limited to stroke (1 in 1000), death (1 in 57), kidney failure [usually temporary] (1 in 500), bleeding (1 in 200), allergic reaction [possibly serious] (1 in 200).  The patient understands and agrees to proceed.   AI:    This has been moderate and will be addresses as above.   PULMONARY HTN:    This went up significantly over serial echos and needs to be addressed with surgical intervention.  I would suspect that is post capillary and related to the mitral valve disease.    FLUTTER:  She has never had any of the symptoms consistent with her previous flutter.  She has had ablation.  She's not on anticoagulation.  HTN:  Her BP is increased and I am going to add Chlorthalidone.

## 2017-06-29 NOTE — Progress Notes (Signed)
  Echocardiogram Echocardiogram Transesophageal has been performed.  Patricia [name] 06/29/2017, 9:37 AM

## 2017-06-29 NOTE — CV Procedure (Signed)
   TEE  Ind: MV stenosis/regurgitation and AV regurgitation  Time out performed  Conscious Sedation: During this procedure the patient is administered a total of Versed 4 mg and Fentanyl 50 mcg to achieve and maintain moderate conscious sedation.  The patient's heart rate, blood pressure, and oxygen saturation are monitored continuously during the procedure. The period of conscious sedation is 30 minutes, of which I was present face-to-face 100% of this time.  Findings:  LV - normal EF 55%  LA - no thrombus  AV - mild to moderate aortic regurgitation. Vena contracta 0.27cm2  MV - moderate mitral regurgitation - eccentric jet, PISA 0.5cm, ERO 0.1cm2, MR volume 87ml, no pulmonary vein reversal       - mild to moderate mitral stenosis - MV mean gradient 5-62mmHg, pressure half time 78- valve area 2.8cm2, Planimetry - 2.3cm2  TV - mild TR PV - trace PR  Discussed with patient and Gwenn, family/friend.  Candee Furbish, MD

## 2017-06-29 NOTE — Discharge Instructions (Signed)

## 2017-06-29 NOTE — Interval H&P Note (Signed)
History and Physical Interval Note:  06/29/2017 8:22 AM  Patricia [name]  has presented today for surgery, with the diagnosis of MITRAL VALVE STENOSIS AND MITRAL VALVE REGURGITATION  The various methods of treatment have been discussed with the patient and family. After consideration of risks, benefits and other options for treatment, the patient has consented to  Procedure(s): TRANSESOPHAGEAL ECHOCARDIOGRAM (TEE) (N/A) as a surgical intervention .  The patient's history has been reviewed, patient examined, no change in status, stable for surgery.  I have reviewed the patient's chart and labs.  Questions were answered to the patient's satisfaction.     [employer]

## 2017-06-30 ENCOUNTER — Encounter (HOSPITAL_COMMUNITY): Payer: Self-pay | Admitting: Cardiology

## 2017-07-06 ENCOUNTER — Encounter: Payer: Medicare Other | Admitting: Surgery

## 2017-07-07 ENCOUNTER — Encounter: Payer: Self-pay | Admitting: Surgery

## 2017-07-07 ENCOUNTER — Institutional Professional Consult (permissible substitution) (INDEPENDENT_AMBULATORY_CARE_PROVIDER_SITE_OTHER): Payer: Medicare Other | Admitting: Surgery

## 2017-07-07 VITALS — BP 125/59 | HR 84 | Resp 16 | Ht 66.0 in | Wt 138.0 lb

## 2017-07-07 DIAGNOSIS — I080 Rheumatic disorders of both mitral and aortic valves: Secondary | ICD-10-CM | POA: Diagnosis not present

## 2017-07-07 NOTE — Progress Notes (Signed)
Cardiothoracic Surgery Consultation  PCP is Pickard, Cammie Mcgee, MD Referring Provider is Minus Breeding, MD  Chief Complaint  Patient presents with  . Mitral Regurgitation    ECHO 12/23/16, CATH 06/27/17, TEE 06/29/17  . 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 She has been followed by Dr. Percival Spanish for mitral stenosis and regurgitation, aortic insufficiency. She reports slowly progressive development of exertional fatigue and shortness of breath over the past few years. She says she feels tired all the time. She denies any chest pressure or tightness. She denies dizziness or syncope but says that occasionally she feels so exhausted with activity she almost passes out. She has orthopnea but no peripheral edema.  A 2D echo in 12/2016 showed normal LV function with moderate AI and findings of severe mitral stenosis and moderate to severe MR. PA systolic pressure was felt to be severely increased. She had a right and left heart cath on 06/27/2017 which showed no coronary disease with normal LV function. There was 3+ MR, severe mitral stenosis with a mean mitral gradient of 11 mm Hg with a valve area by continuity equation of 1.07 cm2. A TEE was done on 06/29/2017 which showed mild to moderate central AI with mildly thickened leaflets, mild to moderate mitral stenosis with a mean gradient of 5-7 mm Hg, press1/2 time of 78 and valve area of 2.8 cm2 by continuity and 2.3 cm2 by planimetry. There was moderate eccentric MR. LV function remains normal.   Past Medical History:  Diagnosis Date  . Atrial flutter (HCC)    Ablated 1998 Dr. Caryl Comes  . Breast cancer ([place])   . COPD (chronic obstructive pulmonary disease) ([place])   . Hodgkin's lymphoma ([place])    Treated with radiation and chemo  . Mitral regurgitation    pvc  . Osteoporosis    lumbar verterbral fracture, left ankle fracture, dexa 2015  . Pancreatitis   . Prediabetes   . Ulcer     Past Surgical History:  Procedure Laterality Date  . CARDIAC ELECTROPHYSIOLOGY [place] AND ABLATION  1999  . CATARACT EXTRACTION, BILATERAL    . COLONOSCOPY    . LAPAROTOMY    . MASTECTOMY  2005   Left-axillary  . ORIF ANKLE FRACTURE Left 03/21/2014   Procedure: LEFT ANKLE FRACTURE OPEN TREATMENT BILMALLEOLAR INCLUDES INTERNAL FIXATION ;  Surgeon: Renette Butters, MD;  Location: [facility];  Service: Orthopedics;  Laterality: Left;  . RIGHT/LEFT HEART CATH AND CORONARY ANGIOGRAPHY N/A 06/27/2017   Procedure: Right/Left Heart Cath and Coronary Angiography;  Surgeon: Larey [name], MD;  Location: [facility];  Service: Cardiovascular;  Laterality: N/A;  . 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 TEE WITHOUT CARDIOVERSION  06/12/2012   Procedure: TRANSESOPHAGEAL ECHOCARDIOGRAM (TEE);  Surgeon: Larey [name], MD;  Location: [facility];  Service: Cardiovascular;  Laterality: N/A;  . TEE WITHOUT CARDIOVERSION N/A 06/29/2017   Procedure: TRANSESOPHAGEAL ECHOCARDIOGRAM (TEE);  Surgeon: Jerline Pain, MD;  Location: [facility];  Service: Cardiovascular;  Laterality: N/A;  . TONSILLECTOMY AND ADENOIDECTOMY  Family History  Problem Relation Age of Onset  . Rheumatic fever Father        Died suddenly age 71  . Diabetes Father   . Cancer Father        colon  . Heart disease Father        rheumatic fever x 3  . Arthritis Maternal Grandmother   . Hypertension Maternal Grandmother   . Diabetes Paternal Grandmother   . Vision loss Paternal Grandmother        glaucoma    Social History Social History  Substance Use Topics  . Smoking  status: Never Smoker  . Smokeless tobacco: Never Used  . Alcohol use No    Current Outpatient Prescriptions  Medication Sig Dispense Refill  . acetaminophen (TYLENOL) 500 MG tablet Take 1,000 mg by mouth 2 (two) times daily as needed for moderate pain or headache.    Marland Kitchen aspirin EC 81 MG tablet Take 81 mg by mouth 3 (three) times a week.    Marland Kitchen CALCIUM PO Take 2,000 mg by mouth daily.    . cetirizine (ZYRTEC) 10 MG tablet Take 10 mg by mouth daily as needed for allergies.    . chlorthalidone (HYGROTON) 25 MG tablet Take 1 tablet (25 mg total) by mouth daily. 90 tablet 3  . esomeprazole (NEXIUM) 40 MG capsule Take 40 mg by mouth every morning.  12  . latanoprost (XALATAN) 0.005 % ophthalmic solution Place 1 drop into both eyes at bedtime.     . lipase/protease/amylase (CREON) 12000 UNITS CPEP capsule Take 1 capsule (12,000 Units total) by mouth 3 (three) times daily with meals. 270 capsule 1  . metoprolol (LOPRESSOR) 50 MG tablet TAKE 1 TABLET (50 MG TOTAL) BY MOUTH 2 (TWO) TIMES DAILY. 180 tablet 3  . Multiple Vitamin (MULTIVITAMIN WITH MINERALS) TABS Take 1 tablet by mouth daily.    Glory Rosebush DELICA LANCETS 17P MISC TEST SUGAR DAILY  4  . ONETOUCH VERIO test strip CHECK SUGAR DAILY  12   No current facility-administered medications for this visit.     Allergies  Allergen Reactions  . Penicillins Anaphylaxis, Hives and Swelling    Has patient had a PCN reaction causing immediate rash, facial/tongue/throat swelling, SOB or lightheadedness with hypotension: Yes Has patient had a PCN reaction causing severe rash involving mucus membranes or skin necrosis: Yes Has patient had a PCN reaction that required hospitalization: No Has patient had a PCN reaction occurring within the last 10 years: No If all of the above answers are "NO", then may proceed with Cephalosporin use.   . Azithromycin Other (See Comments)    pancreatitis  . Other     Cheese - nausea and vomiting   . Sulfa Antibiotics  Hives and Rash    Review of Systems  Constitutional: Positive for activity change and fatigue. Negative for appetite change, chills, fever and unexpected weight change.  HENT: Negative.  Negative for dental problem.        Sees dentist every 6 months.  Eyes: Negative.        Wears glasses  Respiratory: Positive for shortness of breath. Negative for cough and chest tightness.   Cardiovascular: Positive for palpitations. Negative for chest pain and leg swelling.       Shortness of breath with exertion and at rest.  Reports orthopnea  Gastrointestinal:       Reflux  Endocrine: Negative.   Genitourinary: Negative.   Musculoskeletal: Negative.   Skin: Negative.   Allergic/Immunologic: Negative.  Neurological: Positive for light-headedness. Negative for dizziness and syncope.  Hematological: Negative.   Psychiatric/Behavioral: Negative.     BP (!) 125/59 (BP Location: Right Arm, Patient Position: Sitting, Cuff Size: Normal)   Pulse 84   Resp 16   Ht 5\' 6"  (1.676 m)   Wt 138 lb (62.6 kg)   SpO2 98% Comment: ON RA  BMI 22.27 kg/m  Physical Exam  Constitutional: She is oriented to person, place, and time. She appears well-developed and well-nourished. No distress.  HENT:  Head: Normocephalic and atraumatic.  Mouth/Throat: Oropharynx is clear and moist.  Teeth in good condition  Eyes: Pupils are equal, round, and reactive to light. EOM are normal.  Neck: Normal range of motion. Neck supple. No thyromegaly present.  Cardiovascular: Normal rate and regular rhythm.   1/6 systolic murmur apex to axilla  No diastolic murmur  Pulmonary/Chest: No respiratory distress. She has no rales.  Left mastectomy  Abdominal: Soft. Bowel sounds are normal. She exhibits no distension and no mass. There is no tenderness.  Musculoskeletal: Normal range of motion. She exhibits no edema.  Lymphadenopathy:    She has no cervical adenopathy.  Neurological: She is alert and oriented to person, place,  and time. She has normal strength. No cranial nerve deficit or sensory deficit.  Skin: Skin is warm and dry.  Psychiatric: She has a normal mood and affect.     Diagnostic Tests:                          [facility]                        [address], [place] [address]                            [phone]  ------------------------------------------------------------------- Transthoracic Echocardiography  Patient:    Lucette, Kratz MR #:       [id number] Study Date: 12/23/2016 Gender:     F Age:        34 Height:     167.6 cm Weight:     66.7 kg BSA:        1.77 m^2 Pt. Status: Room:   ORDERING     Minus Breeding, MD  REFERRING    Minus Breeding, MD  SONOGRAPHER  Parsons, Will  ATTENDING    Ena Dawley, M.D.  PERFORMING   [facility], Outpatient  cc:  ------------------------------------------------------------------- LV EF: 55% -   60%  ------------------------------------------------------------------- Indications:      (I34.0).  ------------------------------------------------------------------- History:   PMH:  Acquired from the patient and from the patient&'s chart.  Dyspnea.  Atrial flutter.  Mitral valve disease.  Mitral stenosis.  Mitral valve prolapse.  ------------------------------------------------------------------- Study Conclusions  - Left ventricle: The cavity size was normal. There was mild focal   basal hypertrophy of the septum. Systolic function was normal.   The estimated ejection fraction was in the range of 55% to 60%.   Wall motion was normal; there were no regional wall motion   abnormalities. Features are consistent with a pseudonormal left   ventricular filling pattern, with concomitant abnormal relaxation   and increased filling pressure (grade 2 diastolic dysfunction).   Doppler parameters are consistent with elevated  ventricular   end-diastolic filling pressure. - Aortic valve:  There was moderate regurgitation. - Mitral valve: Calcified annulus. Mildly thickened leaflets . The   findings are consistent with severe stenosis. There was moderate   to severe regurgitation directed posteriorly. - Left atrium: The atrium was mildly dilated. - Right atrium: The atrium was normal in size. - Tricuspid valve: There was moderate regurgitation. - Pulmonary arteries: Systolic pressure was severely increased. PA   peak pressure: 78 mm Hg (S). - Inferior vena cava: The vessel was dilated. The respirophasic   diameter changes were blunted (< 50%), consistent with elevated   central venous pressure. - Pericardium, extracardiac: A trivial pericardial effusion was   identified posterior to the heart.  Impressions:  - LVEF is preserved.   Grade 2 diastolic dysfunction with severely elevated filling   pressures.   Severe mitral stenosis.   MR jet posteriorly directed with systolic blunting of the   pulmonary vein forward flow consistent with moderate to severe   mitral regurgitation.   Severe pulmonary hypertension with RVSP 78 mmHg, previously 51   mmHg.   Aortic regurgitation jet not significant on this study but   moderate based on PHT.     Further evaluation of the aortic na dmitral valve by TEE is   recommended.  ------------------------------------------------------------------- Study data:  Comparison was made to the study of 08/31/2015.  Study status:  Routine.  Procedure:  The patient reported no pain pre or post test. Transthoracic echocardiography for left ventricular function evaluation and for assessment of valvular function. Image quality was adequate.  Study completion:  There were no complications.          Transthoracic echocardiography.  M-mode, complete 2D, spectral Doppler, and color Doppler.  Birthdate: Patient birthdate: [birth year].  Age:  Patient is 66 yr old.  Sex: Gender: female.    BMI: 23.7 kg/m^2.  Blood pressure:     156/74 Patient  status:  Outpatient.  Study date:  Study date: 12/23/2016. Study time: 07:50 AM.  Location:  [facility]  -------------------------------------------------------------------  ------------------------------------------------------------------- Left ventricle:  There is false tendon in the left ventricle. The cavity size was normal. There was mild focal basal hypertrophy of the septum. Systolic function was normal. The estimated ejection fraction was in the range of 55% to 60%. Wall motion was normal; there were no regional wall motion abnormalities. Features are consistent with a pseudonormal left ventricular filling pattern, with concomitant abnormal relaxation and increased filling pressure (grade 2 diastolic dysfunction). Doppler parameters are consistent with elevated ventricular end-diastolic filling pressure.  ------------------------------------------------------------------- Aortic valve:   Trileaflet; mildly thickened, mildly calcified leaflets. Mobility was not restricted.  Doppler:  Transvalvular velocity was within the normal range. There was no stenosis. There was moderate regurgitation.  ------------------------------------------------------------------- Aorta:  Aortic root: The aortic root was normal in size.  ------------------------------------------------------------------- Mitral valve:   Calcified annulus. Mildly thickened leaflets . Mobility was not restricted.  Doppler:   The findings are consistent with severe stenosis.   There was moderate to severe regurgitation directed posteriorly.    Valve area by pressure half-time: 3.79 cm^2. Indexed valve area by pressure half-time: 2.14 cm^2/m^2. Valve area by continuity equation (using LVOT flow): 0.82 cm^2. Indexed valve area by continuity equation (using LVOT flow): 0.46 cm^2/m^2.    Mean gradient (D): 11 mm Hg. Peak gradient (D): 18 mm  Hg.  ------------------------------------------------------------------- Left atrium:  The atrium was mildly dilated.  ------------------------------------------------------------------- Right ventricle:  The cavity size was normal.  Wall thickness was normal. Systolic function was normal.  ------------------------------------------------------------------- Pulmonic valve:    Structurally normal valve.   Cusp separation was normal.  Doppler:  Transvalvular velocity was within the normal range. There was no evidence for stenosis. There was mild regurgitation.  ------------------------------------------------------------------- Tricuspid valve:   Structurally normal valve.    Doppler: Transvalvular velocity was within the normal range. There was moderate regurgitation.  ------------------------------------------------------------------- Pulmonary artery:   The main pulmonary artery was normal-sized. Systolic pressure was severely increased.  ------------------------------------------------------------------- Right atrium:  The atrium was normal in size.  ------------------------------------------------------------------- Pericardium:  A trivial pericardial effusion was identified posterior to the heart.  ------------------------------------------------------------------- Systemic veins: Inferior vena cava: The vessel was dilated. The respirophasic diameter changes were blunted (< 50%), consistent with elevated central venous pressure.  ------------------------------------------------------------------- Measurements   Left ventricle                            Value          Reference  LV ID, ED, PLAX chordal           (L)     36.6  mm       43 - 52  LV ID, ES, PLAX chordal                   25.2  mm       23 - 38  LV fx shortening, PLAX chordal            31    %        >=29  LV PW thickness, ED                       9.58  mm       ---------  IVS/LV PW ratio, ED                        1.21           <=1.3  Stroke volume, 2D                         39    ml       ---------  Stroke volume/bsa, 2D                     22    ml/m^2   ---------  LV ejection fraction, 1-p A4C             55    %        ---------  LV end-diastolic volume, 2-p              50    ml       ---------  LV end-systolic volume, 2-p               24    ml       ---------  LV ejection fraction, 2-p                 52    %        ---------  Stroke volume, 2-p                        26    ml       ---------  LV end-diastolic volume/bsa, 2-p  28    ml/m^2   ---------  LV end-systolic volume/bsa, 2-p           14    ml/m^2   ---------  Stroke volume/bsa, 2-p                    14.7  ml/m^2   ---------  LV e&', lateral                            6.43  cm/s     ---------  LV E/e&', lateral                          33.28          ---------  LV e&', medial                             7.51  cm/s     ---------  LV E/e&', medial                           28.5           ---------  LV e&', average                            6.97  cm/s     ---------  LV E/e&', average                          30.7           ---------    Ventricular septum                        Value          Reference  IVS thickness, ED                         11.6  mm       ---------    LVOT                                      Value          Reference  LVOT ID, S                                17    mm       ---------  LVOT area                                 2.27  cm^2     ---------  LVOT ID                                   17    mm       ---------  LVOT peak velocity, S                     76.7  cm/s     ---------  LVOT mean velocity, S                     52.6  cm/s     ---------  LVOT VTI, S                               17    cm       ---------  LVOT peak gradient, S                     2     mm Hg    ---------  Stroke volume (SV), LVOT DP               38.6  ml       ---------  Stroke index (SV/bsa), LVOT DP             21.8  ml/m^2   ---------    Aortic valve                              Value          Reference  Aortic regurg pressure half-time          232   ms       ---------    Aorta                                     Value          Reference  Aortic root ID, ED                        28    mm       ---------  Ascending aorta ID, A-P, S                24    mm       ---------    Left atrium                               Value          Reference  LA ID, A-P, ES                            33    mm       ---------  LA ID/bsa, A-P                            1.87  cm/m^2   <=2.2  LA volume, S                              41    ml       ---------  LA volume/bsa, S                          23.2  ml/m^2   ---------  LA volume, ES, 1-p A4C                    34    ml       ---------  LA volume/bsa, ES, 1-p A4C                19.2  ml/m^2   ---------  LA volume, ES, 1-p A2C                    46    ml       ---------  LA volume/bsa, ES, 1-p A2C                26    ml/m^2   ---------    Mitral valve                              Value          Reference  Mitral E-wave peak velocity               214   cm/s     ---------  Mitral A-wave peak velocity               111   cm/s     ---------  Mitral mean velocity, D                   153   cm/s     ---------  Mitral deceleration time          (H)     239   ms       150 - 230  Mitral pressure half-time                 58    ms       ---------  Mitral mean gradient, D                   11    mm Hg    ---------  Mitral peak gradient, D                   18    mm Hg    ---------  Mitral E/A ratio, peak                    1.3            ---------  Mitral valve area, PHT, DP                3.79  cm^2     ---------  Mitral valve area/bsa, PHT, DP            2.14  cm^2/m^2 ---------  Mitral valve area, LVOT                   0.82  cm^2     ---------  continuity  Mitral valve area/bsa, LVOT               0.46  cm^2/m^2 ---------  continuity  Mitral annulus  VTI, D                     47.2  cm       ---------    Pulmonary arteries                        Value          Reference  PA pressure, S, DP                (H)     78  mm Hg    <=30    Tricuspid valve                           Value          Reference  Tricuspid regurg peak velocity            396   cm/s     ---------  Tricuspid peak RV-RA gradient             63    mm Hg    ---------    Systemic veins                            Value          Reference  Estimated CVP                             8     mm Hg    ---------    Right ventricle                           Value          Reference  RV pressure, S, DP                (H)     71    mm Hg    <=30  RV s&', lateral, S                         10.3  cm/s     ---------  Legend: (L)  and  (H)  mark values outside specified reference range.  ------------------------------------------------------------------- Prepared and Electronically Authenticated by  Ena Dawley, M.D. 2018-01-12T09:17:15  Physicians   Panel Physicians Referring Physician Case Authorizing Physician  Larey [name], MD (Primary)    Procedures   Right/Left Heart Cath and Coronary Angiography  Conclusion     There is moderate (3+) mitral regurgitation.   1. No significant coronary disease.  2. Normal LV systolic function.  3. 3+ mitral regurgitation.  4. Severe mitral stenosis.  5. No more than mild aortic stenosis.  6. Mildly elevated PCWP with near-normal LVEDP.  7. 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  8. Root shot not done to assess aortic insufficiency, will have TEE on Thursday.    Procedural Details/Technique   Technical Details Procedure: Right Heart Cath, Left Heart Cath, Selective Coronary Angiography, LV angiography  Indication: Mitral and  aortic valve disease, pre-surgery   Procedural Details: The right groin and right radial area were prepped, draped, and anesthetized with 1% lidocaine. Using the modified Seldinger technique a 7 French sheath was placed in the right femoral vein. A Swan-Ganz catheter was used for the right heart catheterization. Standard protocol was followed for recording of right heart pressures and sampling of oxygen saturations. Fick and thermodilution cardiac output was calculated. The right radial artery was entered using modified Seldinger technique and a 34F sheath was placed. The patient received 3 mg IA verapamil, weight-based IV heparin, and later due to radial vasospasm 200 mcg IA NTG. Standard Judkins catheters were used for selective coronary angiography and left ventriculography. Simultaneous LV and PCWP  were measured. There were no immediate procedural complications. The patient was transferred to the post catheterization recovery area for further monitoring.   Estimated blood loss <50 mL.  During this procedure the patient was administered the following to achieve and maintain moderate conscious sedation: Versed 2 mg, Fentanyl 50 mcg, while the patient's heart rate, blood pressure, and oxygen saturation were continuously monitored. The period of conscious sedation was 53 minutes, of which I was present face-to-face 100% of this time.    Coronary Findings   Dominance: Right  Left Main  Short, no angiographic disease.  Left Anterior Descending  No angiographic CAD.  Left Circumflex  Mild luminal irregularities.  Right Coronary Artery  No angiographic CAD.  Right Heart   Right Heart Pressures [facility] Procedural Findings: Hemodynamics (mmHg) RA mean 6 RV 68/8 PA 62/16, mean 37 PCWP mean 18 LV 106/14 AO 94/44  Oxygen saturations: PA 67% AO 99%  Cardiac Output (Fick) 4.15  Cardiac Index (Fick) 2.43 PVR 4.6 WU  Cardiac Output (Thermo) 3.42 Cardiac Index (Thermo) 2  PVR 5.5 WU  Aortic  valve peak to peak gradient 12 mmHg  Mitral valve mean gradient 11 mmHg Mitral valve area (continuity equation) 1.07 cm^2    Wall Motion   EF 60-65%, no wall motion abnormalities in the RAO projection.         Left Heart   Mitral Valve There is moderate (3+) mitral regurgitation. 3+ mitral regurgitation.    Implants     No implant documentation for this case.  PACS Images   Show images for Cardiac catheterization   Link to Procedure Log   Procedure Log    Hemo Data    Most Recent Value  Fick Cardiac Output 4.15 L/min  Fick Cardiac Output Index 2.43 (L/min)/BSA  Thermal Cardiac Output 3.42 L/min  Thermal Cardiac Output Index 2 (L/min)/BSA  Mitral Mean Gradient 11.1 mmHg  Mitral Peak Gradient 6 mmHg  Mitral Valve Area Index 0.63 cm2/BSA  RA A Wave 14 mmHg  RA V Wave 8 mmHg  RA Mean 6 mmHg  RV Systolic Pressure 68 mmHg  RV Diastolic Pressure 2 mmHg  RV EDP 8 mmHg  PA Systolic Pressure 62 mmHg  PA Diastolic Pressure 16 mmHg  PA Mean 37 mmHg  PW A Wave 18 mmHg  PW V Wave 26 mmHg  PW Mean 18 mmHg  AO Systolic Pressure 86 mmHg  AO Diastolic Pressure 44 mmHg  AO Mean 61 mmHg  LV Systolic Pressure 619 mmHg  LV Diastolic Pressure 4 mmHg  LV EDP 15 mmHg  Arterial Occlusion Pressure Extended Systolic Pressure 94 mmHg  Arterial Occlusion Pressure Extended Diastolic Pressure 44 mmHg  Arterial Occlusion Pressure Extended Mean Pressure 64 mmHg  Left Ventricular Apex Extended Systolic Pressure 509 mmHg  Left Ventricular Apex Extended Diastolic Pressure 3 mmHg  Left Ventricular Apex Extended EDP Pressure 14 mmHg  QP/QS 1  TPVR Index 15.25 HRUI  TSVR Index 24.32 HRUI  PVR SVR Ratio 0.36  TPVR/TSVR Ratio 0.63         *[place]*                   *[facility]*                         [address],  [place] [address]                             [phone]  ------------------------------------------------------------------- Transesophageal Echocardiography  Patient:    Helaine, Yackel MR #:       [id number] Study Date: 06/29/2017 Gender:     F Age:        63 Height:     167.6 cm Weight:     59.9 kg BSA:        1.67 m^2 Pt. Status: Room:   ADMITTING    Candee Furbish, M.D.  ATTENDING    Candee Furbish, M.D.  ORDERING     Candee Furbish, M.D.  PERFORMING   Candee Furbish, M.D.  SONOGRAPHER  Lauren Pennington, RDCS, CCT  cc:  ------------------------------------------------------------------- LV EF: 55% -   60%  ------------------------------------------------------------------- Indications:      Mitral regurgitation 424.0.  ------------------------------------------------------------------- Study Conclusions  - Left ventricle: The cavity size was normal. Wall thickness was   normal. Systolic function was normal. The estimated ejection   fraction was in the range of 55% to 60%. Wall motion was normal;   there were no regional wall motion abnormalities. - Aortic valve: Mildly thickened, mildly calcified leaflets. No   evidence of vegetation. There was mild to moderate regurgitation.   Vena contracta 0.27cm2. - Mitral valve: No evidence of vegetation. The findings are   consistent with mild to moderate stenosis. MV mean gradient   5-68mmHg, pressure half time 78- valve area 2.8cm2, Planimetry -   2.3cm2   There was moderate regurgitation directed eccentrically. PISA   0.5cm, ERO 0.1cm2, MR volume 49ml, no pulmonary vein reversal. - Left atrium: No evidence of thrombus in the atrial cavity or   appendage. No evidence of thrombus in the appendage. - Atrial septum: Very few bubbles /delayed appearance of agitated   saline contrast in the LA suggestive of possible transpulmonary   passage or shunt. No significant PFO or ASD. - Tricuspid valve: No evidence of vegetation. - Pulmonic valve: No evidence of vegetation. -  Superior vena cava: The study excluded a thrombus.  ------------------------------------------------------------------- Study data:   Study status:  Routine.  Consent:  The risks, benefits, and alternatives to the procedure were explained to the patient and informed consent was obtained.  Procedure:  Initial setup. The patient was brought to the laboratory. Surface ECG leads were monitored. Sedation. Conscious sedation was administered by cardiology staff. Transesophageal echocardiography. An adult multiplane transesophageal probe was inserted by the attending cardiologistwithout difficulty. Image quality was adequate. Intravenous contrast (agitated saline) was administered.  Study completion:  The patient tolerated the procedure well. There were no complications.  Administered medications:   Midazolam, 4mg , IV. Fentanyl, 63mcg, IV.          Diagnostic transesophageal echocardiography.  2D and color Doppler.  Birthdate:  Patient birthdate: [birth year].  Age:  Patient is 66 yr old.  Sex:  Gender: female.    BMI: 21.3 kg/m^2.  Blood pressure:     155/68  Patient status:  Outpatient.  Study date:  Study date: 06/29/2017. Study time: 08:11 AM.  Location:  Endoscopy.  -------------------------------------------------------------------  ------------------------------------------------------------------- Left ventricle:  The cavity size was normal. Wall thickness was normal. Systolic function was normal. The estimated ejection fraction was in the range of 55% to 60%. Wall motion was normal; there were no regional wall motion abnormalities.  ------------------------------------------------------------------- Aortic valve:   Mildly thickened, mildly calcified leaflets. Cusp separation was  normal.  No evidence of vegetation.  Doppler:  There was mild to moderate regurgitation. Vena contracta 0.27cm2.  ------------------------------------------------------------------- Aorta:  The aorta  was normal, not dilated, and non-diseased.  ------------------------------------------------------------------- Mitral valve:   Structurally normal valve.   Leaflet separation was normal.  No evidence of vegetation.  Doppler:   The findings are consistent with mild to moderate stenosis.   MV mean gradient 5-62mmHg, pressure half time 78- valve area 2.8cm2, Planimetry - 2.3cm2 There was moderate regurgitation directed eccentrically. PISA 0.5cm, ERO 0.1cm2, MR volume 20ml, no pulmonary vein reversal. Valve area by pressure half-time: 2.72 cm^2. Indexed valve area by pressure half-time: 1.63 cm^2/m^2.    Mean gradient (D): 6 mm Hg.   ------------------------------------------------------------------- Left atrium:  The atrium was normal in size.  No evidence of thrombus in the atrial cavity or appendage.  No evidence of thrombus in the appendage. The appendage was of normal size. Emptying velocity was normal.  ------------------------------------------------------------------- Atrial septum:  Very few bubbles /delayed appearance of agitated saline contrast in the LA suggestive of possible transpulmonary passage or shunt. No significant PFO or ASD.  ------------------------------------------------------------------- Right ventricle:  The cavity size was normal. Wall thickness was normal. Systolic function was normal.  ------------------------------------------------------------------- Pulmonic valve:    Structurally normal valve.   Cusp separation was normal.  No evidence of vegetation.  Doppler:  There was trivial regurgitation.  ------------------------------------------------------------------- Tricuspid valve:   Structurally normal valve.   Leaflet separation was normal.  No evidence of vegetation.  Doppler:  There was mild regurgitation.  ------------------------------------------------------------------- Pulmonary artery:   The main pulmonary artery was normal-sized.    ------------------------------------------------------------------- Pericardium:  The pericardium was normal in appearance. There was no pericardial effusion.  ------------------------------------------------------------------- Systemic veins: Superior vena cava: The study excluded a thrombus.  ------------------------------------------------------------------- Post procedure conclusions Ascending Aorta:  - The aorta was normal, not dilated, and non-diseased.  ------------------------------------------------------------------- Measurements   Mitral valve                              Value  Mitral mean velocity, D                   116   cm/s  Mitral pressure half-time                 78    ms  Mitral mean gradient, D                   6     mm Hg  Mitral valve area, PHT, DP                2.72  cm^2  Mitral valve area/bsa, PHT, DP            1.63  cm^2/m^2  Mitral annulus VTI, D                     46.2  cm  Mitral maximal regurg velocity, PISA      565   cm/s  Mitral regurg VTI, PISA                   181   cm  Mitral ERO, PISA                          0.1   cm^2  Mitral regurg volume, PISA  18    ml  Legend: (L)  and  (H)  mark values outside specified reference range.  ------------------------------------------------------------------- Prepared and Electronically Authenticated by  Candee Furbish, M.D. 2018-07-19T09:44:34    Impression:  This 66 year old woman has slowly progressive exertional fatigue and shortness of breath over the past few years with occasional symptoms at rest. 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 The MR was first identified in 2013 when she had an echo for shortness of breath and was noted to have moderate to severe MR. I have personally reviewed her echos and cath films. There is some incongruity between her 2D echo in January and her TEE now. Her 2D echo was felt to shows severe MS and moderate to  severe MR but her TEE now shows mild to moderate MS and moderate MR. She had 3+ MR on cath. Her AI appears central and looks moderate at most. The aortic valve appears to be in good structural condition. She has pulmonary hypertension which must be due to a combination of her aortic and mitral valve disease. She did receive maximum chest radiation in the past for Hodgkin's lymphoma which could be contributing to lung disease and pulmonary hypertension. PFT's with diffusion capacity are indicated as well as a follow up chest CT to assess the mediastinum and lungs and rule out any recurrent malignancy. I reviewed the studies with her and her husband and told them that I would like to get her back for a second surgical consult with Dr. Roxy Manns before making any final recommendations about valve surgery. She is amenable to that. All of her questions have been answered.  Plan:  She will return to see Dr. Roxy Manns for a second surgical evaluation in the next couple weeks.  I spent 60 minutes performing this consultation and > 50% of this time was spent face to face counseling and coordinating the care of this patient's aortic and mitral valve disease.  [name], MD [facility] [phone]

## 2017-07-25 ENCOUNTER — Encounter: Payer: Self-pay | Admitting: Thoracic Surgery (Cardiothoracic Vascular Surgery)

## 2017-07-25 ENCOUNTER — Institutional Professional Consult (permissible substitution) (INDEPENDENT_AMBULATORY_CARE_PROVIDER_SITE_OTHER): Payer: Medicare Other | Admitting: Thoracic Surgery (Cardiothoracic Vascular Surgery)

## 2017-07-25 VITALS — BP 121/70 | HR 92 | Resp 18 | Ht 66.0 in | Wt 134.0 lb

## 2017-07-25 DIAGNOSIS — I351 Nonrheumatic aortic (valve) insufficiency: Secondary | ICD-10-CM

## 2017-07-25 DIAGNOSIS — I340 Nonrheumatic mitral (valve) insufficiency: Secondary | ICD-10-CM | POA: Diagnosis not present

## 2017-07-25 DIAGNOSIS — I050 Rheumatic mitral stenosis: Secondary | ICD-10-CM | POA: Diagnosis not present

## 2017-07-25 DIAGNOSIS — I080 Rheumatic disorders of both mitral and aortic valves: Secondary | ICD-10-CM | POA: Diagnosis not present

## 2017-07-25 NOTE — Progress Notes (Signed)
[address]       [place] [address]             [phone]     CARDIOTHORACIC SURGERY CONSULTATION REPORT  Referring Provider is Patricia Breeding, MD PCP is Patricia Frizzle, MD  Chief Complaint  Patient presents with  . Mitral Regurgitation    Surgical eval, Cardiac Cath 06/27/17, TEE 06/29/17  . 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  The patient has been followed for the last several years by Dr. Percival [name] with known history of mitral stenosis, mitral regurgitation, and aortic insufficiency. Over the past 3 years or so the patient reports progressive symptoms of exertional fatigue and shortness of breath. Symptoms became considerably worse in January of this year at which time she presented with shortness of breath with minimal activity and occasionally at rest with severe orthopnea and episodes of PND. Echocardiogram performed at that time revealed normal left ventricular systolic function with moderate aortic insufficiency and what was felt to be severe mitral stenosis and moderate to severe mitral regurgitation. Medications were adjusted with some degree of symptomatic improvement. She underwent left and right heart catheterization on 06/27/2017 revealing no significant coronary artery disease. There was moderate (3+) mitral  regurgitation, severe mitral stenosis with mean transvalvular gradient estimated 11 mmHg, and moderate pulmonary hypertension. The patient underwent transesophageal echocardiogram demonstrating only mild to moderate central aortic insufficiency with mild mitral stenosis and moderate to severe mitral regurgitation. The patient was referred for surgical consultation and has been evaluated previously by Dr. Cyndia [name] on 07/07/2017. The patient has been referred for second surgical opinion to consider the possibility of minimally invasive mitral valve repair or replacement.  The patient is married and lives locally in [place] with her husband who suffers from pancreatic cancer and is currently hospitalized on hospice care with very short life expectancy. The patient states that over the last several months she has been quite stable clinically since her medications were adjusted by Dr. Percival [name] earlier in the year. She still gets short of breath with moderate low-level activity. She sleeps on 3 pillows at night and cannot lay flat in bed. She denies any resting shortness of breath. She has not had significant chest [name] or lower extremity edema and she has had occasional dizzy spells and several syncopal episodes although none recently.    Past Medical History:  Diagnosis Date  . Atrial flutter ([facility])    Ablated 1998 Dr. Caryl [name]  . Breast cancer ([facility])   . COPD (chronic obstructive pulmonary disease) ([facility])   . Hodgkin's lymphoma ([facility])    Treated with radiation and chemo  . Mitral regurgitation    pvc  . Osteoporosis    lumbar verterbral fracture, left ankle fracture, dexa 2015  . Pancreatitis   . Prediabetes   . Ulcer     Past Surgical History:  Procedure Laterality Date  . CARDIAC ELECTROPHYSIOLOGY [place] AND ABLATION  1999  .  CATARACT EXTRACTION, BILATERAL    . COLONOSCOPY    . LAPAROTOMY    . MASTECTOMY  2005   Left-axillary  . ORIF ANKLE FRACTURE Left 03/21/2014   Procedure: LEFT ANKLE  FRACTURE OPEN TREATMENT BILMALLEOLAR INCLUDES INTERNAL FIXATION ;  Surgeon: Patricia Butters, MD;  Location: [facility];  Service: Orthopedics;  Laterality: Left;  . RIGHT/LEFT HEART CATH AND CORONARY ANGIOGRAPHY N/A 06/27/2017   Procedure: Right/Left Heart Cath and Coronary Angiography;  Surgeon: Patricia [name], MD;  Location: [facility];  Service: Cardiovascular;  Laterality: N/A;  . 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 TEE WITHOUT CARDIOVERSION  06/12/2012   Procedure: TRANSESOPHAGEAL ECHOCARDIOGRAM (TEE);  Surgeon: Patricia [name], MD;  Location: [facility];  Service: Cardiovascular;  Laterality: N/A;  . TEE WITHOUT CARDIOVERSION N/A 06/29/2017   Procedure: TRANSESOPHAGEAL ECHOCARDIOGRAM (TEE);  Surgeon: Patricia Pain, MD;  Location: [facility];  Service: Cardiovascular;  Laterality: N/A;  . TONSILLECTOMY AND ADENOIDECTOMY      Family History  Problem Relation Age of Onset  . Rheumatic fever Father        Died suddenly age 74  . Diabetes Father   . Cancer Father        colon  . Heart disease Father        rheumatic fever x 3  . Arthritis Maternal Grandmother   . Hypertension Maternal Grandmother   . Diabetes Paternal Grandmother   . Vision loss Paternal Grandmother        glaucoma    Social History   Social History  . Marital status: Married    Spouse name: N/A  . Number of children: N/A  . Years of education: N/A   Occupational History  . Teacher (retired)    Social History Main Topics  . Smoking status: Never Smoker  . Smokeless tobacco: Never Used  . Alcohol use No  . Drug use: No  . Sexual activity: Yes   Other Topics Concern  . Not on file   Social History Narrative   Lives at home with husband.    Current Outpatient Prescriptions  Medication Sig Dispense Refill  . acetaminophen (TYLENOL) 500 MG tablet Take 1,000 mg by mouth 2 (two) times daily as needed for moderate [name] or headache.    Marland Kitchen aspirin EC 81 MG tablet Take 81 mg  by mouth 3 (three) times a week.    Marland Kitchen CALCIUM PO Take 2,000 mg by mouth daily.    . cetirizine (ZYRTEC) 10 MG tablet Take 10 mg by mouth daily as needed for allergies.    . chlorthalidone (HYGROTON) 25 MG tablet Take 1 tablet (25 mg total) by mouth daily. 90 tablet 3  . esomeprazole (NEXIUM) 40 MG capsule Take 40 mg by mouth every morning.  12  . latanoprost (XALATAN) 0.005 % ophthalmic solution Place 1 drop into both eyes at bedtime.     . lipase/protease/amylase (CREON) 12000 UNITS CPEP capsule Take 1 capsule (12,000 Units total) by mouth 3 (three) times daily with meals. 270 capsule 1  . metoprolol (LOPRESSOR) 50 MG tablet TAKE 1 TABLET (50 MG TOTAL) BY MOUTH 2 (TWO) TIMES DAILY. 180 tablet 3  . Multiple Vitamin (MULTIVITAMIN WITH MINERALS) TABS Take 1 tablet by mouth daily.    Glory Rosebush DELICA LANCETS 77A MISC TEST SUGAR DAILY  4  . ONETOUCH VERIO test strip CHECK SUGAR DAILY  12   No current facility-administered medications for this visit.  Allergies  Allergen Reactions  . Penicillins Anaphylaxis, Hives and Swelling    Has patient had a PCN reaction causing immediate rash, facial/tongue/throat swelling, SOB or lightheadedness with hypotension: Yes Has patient had a PCN reaction causing severe rash involving mucus membranes or skin necrosis: Yes Has patient had a PCN reaction that required hospitalization: No Has patient had a PCN reaction occurring within the last 10 years: No If all of the above answers are "NO", then may proceed with Cephalosporin use.   . Azithromycin Other (See Comments)    pancreatitis  . Other     Cheese - nausea and vomiting   . Sulfa Antibiotics Hives and Rash      Review of Systems:   General:  normal appetite, decreased energy, no weight gain, no weight loss, no fever  Cardiac:  no chest [name] with exertion, no chest [name] at rest, + SOB with exertion, occasional resting SOB, + PND, + orthopnea, no palpitations, no arrhythmia, no atrial  fibrillation, no LE edema, + dizzy spells, + syncope  Respiratory:  + shortness of breath, no home oxygen, no productive cough, no dry cough, no bronchitis, no wheezing, no hemoptysis, no asthma, no [name] with inspiration or cough, no sleep apnea, no CPAP at night  GI:   no difficulty swallowing, no reflux, no frequent heartburn, no hiatal hernia, no abdominal [name], occasional constipation, no diarrhea, no hematochezia, no hematemesis, no melena  GU:   no dysuria,  no frequency, no urinary tract infection, no hematuria, no  kidney stones, no kidney disease  Vascular:  no [name] suggestive of claudication, no [name] in feet, no leg cramps, no varicose veins, no DVT, no non-healing foot ulcer  Neuro:   no stroke, no TIA's, no seizures, no headaches, no temporary blindness one eye,  no slurred speech, no peripheral neuropathy, no chronic [name], no instability of gait, no memory/cognitive dysfunction  Musculoskeletal: mild arthritis, no joint swelling, no myalgias, no difficulty walking, normal mobility   Skin:   no rash, no itching, no skin infections, no pressure sores or ulcerations  Psych:   no anxiety, + depression, no nervousness, + unusual recent stress  Eyes:   no blurry vision, no floaters, no recent vision changes, + wears glasses or contacts  ENT:   no hearing loss, no loose or painful teeth, no dentures, last saw dentist within the past 6 months  Hematologic:  no easy bruising, no abnormal bleeding, no clotting disorder, no frequent epistaxis  Endocrine:  + "borderline" diabetes, does not check CBG's at home     Physical Exam:   BP 121/70   Pulse 92   Resp 18   Ht 5\' 6"  (1.676 m)   Wt 134 lb (60.8 kg)   SpO2 99% Comment: RA  BMI 21.63 kg/m   General:    well-appearing  HEENT:  Unremarkable   Neck:   no JVD, no bruits, no adenopathy   Chest:   clear to auscultation, symmetrical breath sounds, no wheezes, no rhonchi   CV:   RRR, grade III/VI systolic murmur best at apex, no diastolic  murmur  Abdomen:  soft, non-tender, no masses   Extremities:  warm, well-perfused, pulses diminished but palpable, no LE edema  Rectal/GU  Deferred  Neuro:   Grossly non-focal and symmetrical throughout  Skin:   Clean and dry, no rashes, no breakdown   Diagnostic Tests:  Transthoracic Echocardiography  Patient:    Patricia [name], Patricia [name] MR #:       [id number]  Study Date: 12/23/2016 Gender:     F Age:        38 Height:     167.6 cm Weight:     66.7 kg BSA:        1.77 m^2 Pt. Status: Room:   ORDERING     Patricia Breeding, MD  REFERRING    Patricia Breeding, MD  SONOGRAPHER  [place], Will  ATTENDING    Ena Dawley, M.D.  PERFORMING   [facility], [facility]  cc:  ------------------------------------------------------------------- LV EF: 55% -   60%  ------------------------------------------------------------------- Indications:      (I34.0).  ------------------------------------------------------------------- History:   PMH:  Acquired from the patient and from the patient&'s chart.  Dyspnea.  Atrial flutter.  Mitral valve disease.  Mitral stenosis.  Mitral valve prolapse.  ------------------------------------------------------------------- Study Conclusions  - Left ventricle: The cavity size was normal. There was mild focal   basal hypertrophy of the septum. Systolic function was normal.   The estimated ejection fraction was in the range of 55% to 60%.   Wall motion was normal; there were no regional wall motion   abnormalities. Features are consistent with a pseudonormal left   ventricular filling pattern, with concomitant abnormal relaxation   and increased filling pressure (grade 2 diastolic dysfunction).   Doppler parameters are consistent with elevated ventricular   end-diastolic filling pressure. - Aortic valve: There was moderate regurgitation. - Mitral valve: Calcified annulus. Mildly thickened leaflets . The   findings are consistent with severe stenosis.  There was moderate   to severe regurgitation directed posteriorly. - Left atrium: The atrium was mildly dilated. - Right atrium: The atrium was normal in size. - Tricuspid valve: There was moderate regurgitation. - Pulmonary arteries: Systolic pressure was severely increased. PA   peak pressure: 78 mm Hg (S). - Inferior vena cava: The vessel was dilated. The respirophasic   diameter changes were blunted (< 50%), consistent with elevated   central venous pressure. - Pericardium, extracardiac: A trivial pericardial effusion was   identified posterior to the heart.  Impressions:  - LVEF is preserved.   Grade 2 diastolic dysfunction with severely elevated filling   pressures.   Severe mitral stenosis.   MR jet posteriorly directed with systolic blunting of the   pulmonary vein forward flow consistent with moderate to severe   mitral regurgitation.   Severe pulmonary hypertension with RVSP 78 mmHg, previously 51   mmHg.   Aortic regurgitation jet not significant on this study but   moderate based on PHT.     Further evaluation of the aortic na dmitral valve by TEE is   recommended.  ------------------------------------------------------------------- Study data:  Comparison was made to the study of 08/31/2015.  Study status:  Routine.  Procedure:  The patient reported no [name] pre or post test. Transthoracic echocardiography for left ventricular function evaluation and for assessment of valvular function. Image quality was adequate.  Study completion:  There were no complications.          Transthoracic echocardiography.  M-mode, complete 2D, spectral Doppler, and color Doppler.  Birthdate: Patient birthdate: 1951-09-08.  Age:  Patient is 66 yr old.  Sex: Gender: female.    BMI: 23.7 kg/m^2.  Blood pressure:     156/74 Patient status:  Outpatient.  Study date:  Study date: 12/23/2016. Study time: 07:50 AM.  Location:  [facility]  3  -------------------------------------------------------------------  ------------------------------------------------------------------- Left ventricle:  There is false tendon in the left ventricle. The cavity size was normal. There was  mild focal basal hypertrophy of the septum. Systolic function was normal. The estimated ejection fraction was in the range of 55% to 60%. Wall motion was normal; there were no regional wall motion abnormalities. Features are consistent with a pseudonormal left ventricular filling pattern, with concomitant abnormal relaxation and increased filling pressure (grade 2 diastolic dysfunction). Doppler parameters are consistent with elevated ventricular end-diastolic filling pressure.  ------------------------------------------------------------------- Aortic valve:   Trileaflet; mildly thickened, mildly calcified leaflets. Mobility was not restricted.  Doppler:  Transvalvular velocity was within the normal range. There was no stenosis. There was moderate regurgitation.  ------------------------------------------------------------------- Aorta:  Aortic root: The aortic root was normal in size.  ------------------------------------------------------------------- Mitral valve:   Calcified annulus. Mildly thickened leaflets . Mobility was not restricted.  Doppler:   The findings are consistent with severe stenosis.   There was moderate to severe regurgitation directed posteriorly.    Valve area by pressure half-time: 3.79 cm^2. Indexed valve area by pressure half-time: 2.14 cm^2/m^2. Valve area by continuity equation (using LVOT flow): 0.82 cm^2. Indexed valve area by continuity equation (using LVOT flow): 0.46 cm^2/m^2.    Mean gradient (D): 11 mm Hg. Peak gradient (D): 18 mm Hg.  ------------------------------------------------------------------- Left atrium:  The atrium was mildly  dilated.  ------------------------------------------------------------------- Right ventricle:  The cavity size was normal. Wall thickness was normal. Systolic function was normal.  ------------------------------------------------------------------- Pulmonic valve:    Structurally normal valve.   Cusp separation was normal.  Doppler:  Transvalvular velocity was within the normal range. There was no evidence for stenosis. There was mild regurgitation.  ------------------------------------------------------------------- Tricuspid valve:   Structurally normal valve.    Doppler: Transvalvular velocity was within the normal range. There was moderate regurgitation.  ------------------------------------------------------------------- Pulmonary artery:   The main pulmonary artery was normal-sized. Systolic pressure was severely increased.  ------------------------------------------------------------------- Right atrium:  The atrium was normal in size.  ------------------------------------------------------------------- Pericardium:  A trivial pericardial effusion was identified posterior to the heart.  ------------------------------------------------------------------- Systemic veins: Inferior vena cava: The vessel was dilated. The respirophasic diameter changes were blunted (< 50%), consistent with elevated central venous pressure.  ------------------------------------------------------------------- Measurements   Left ventricle                            Value          Reference  LV ID, ED, PLAX chordal           (L)     36.6  mm       43 - 52  LV ID, ES, PLAX chordal                   25.2  mm       23 - 38  LV fx shortening, PLAX chordal            31    %        >=29  LV PW thickness, ED                       9.58  mm       ---------  IVS/LV PW ratio, ED                       1.21           <=1.3  Stroke volume, 2D  39    ml       ---------   Stroke volume/bsa, 2D                     22    ml/m^2   ---------  LV ejection fraction, 1-p A4C             55    %        ---------  LV end-diastolic volume, 2-p              50    ml       ---------  LV end-systolic volume, 2-p               24    ml       ---------  LV ejection fraction, 2-p                 52    %        ---------  Stroke volume, 2-p                        26    ml       ---------  LV end-diastolic volume/bsa, 2-p          28    ml/m^2   ---------  LV end-systolic volume/bsa, 2-p           14    ml/m^2   ---------  Stroke volume/bsa, 2-p                    14.7  ml/m^2   ---------  LV e&', lateral                            6.43  cm/s     ---------  LV E/e&', lateral                          33.28          ---------  LV e&', medial                             7.51  cm/s     ---------  LV E/e&', medial                           28.5           ---------  LV e&', average                            6.97  cm/s     ---------  LV E/e&', average                          30.7           ---------    Ventricular septum                        Value          Reference  IVS thickness, ED                         11.6  mm       ---------  LVOT                                      Value          Reference  LVOT ID, S                                17    mm       ---------  LVOT area                                 2.27  cm^2     ---------  LVOT ID                                   17    mm       ---------  LVOT peak velocity, S                     76.7  cm/s     ---------  LVOT mean velocity, S                     52.6  cm/s     ---------  LVOT VTI, S                               17    cm       ---------  LVOT peak gradient, S                     2     mm Hg    ---------  Stroke volume (SV), LVOT DP               38.6  ml       ---------  Stroke index (SV/bsa), LVOT DP            21.8  ml/m^2   ---------    Aortic valve                              Value          Reference   Aortic regurg pressure half-time          232   ms       ---------    Aorta                                     Value          Reference  Aortic root ID, ED                        28    mm       ---------  Ascending aorta ID, A-P, S                24    mm       ---------    Left atrium  Value          Reference  LA ID, A-P, ES                            33    mm       ---------  LA ID/bsa, A-P                            1.87  cm/m^2   <=2.2  LA volume, S                              41    ml       ---------  LA volume/bsa, S                          23.2  ml/m^2   ---------  LA volume, ES, 1-p A4C                    34    ml       ---------  LA volume/bsa, ES, 1-p A4C                19.2  ml/m^2   ---------  LA volume, ES, 1-p A2C                    46    ml       ---------  LA volume/bsa, ES, 1-p A2C                26    ml/m^2   ---------    Mitral valve                              Value          Reference  Mitral E-wave peak velocity               214   cm/s     ---------  Mitral A-wave peak velocity               111   cm/s     ---------  Mitral mean velocity, D                   153   cm/s     ---------  Mitral deceleration time          (H)     239   ms       150 - 230  Mitral pressure half-time                 58    ms       ---------  Mitral mean gradient, D                   11    mm Hg    ---------  Mitral peak gradient, D                   18    mm Hg    ---------  Mitral E/A ratio, peak                    1.3            ---------  Mitral valve area, PHT, DP                3.79  cm^2     ---------  Mitral valve area/bsa, PHT, DP            2.14  cm^2/m^2 ---------  Mitral valve area, LVOT                   0.82  cm^2     ---------  continuity  Mitral valve area/bsa, LVOT               0.46  cm^2/m^2 ---------  continuity  Mitral annulus VTI, D                     47.2  cm       ---------    Pulmonary arteries                        Value           Reference  PA pressure, S, DP                (H)     78    mm Hg    <=30    Tricuspid valve                           Value          Reference  Tricuspid regurg peak velocity            396   cm/s     ---------  Tricuspid peak RV-RA gradient             63    mm Hg    ---------    Systemic veins                            Value          Reference  Estimated CVP                             8     mm Hg    ---------    Right ventricle                           Value          Reference  RV pressure, S, DP                (H)     71    mm Hg    <=30  RV s&', lateral, S                         10.3  cm/s     ---------  Legend: (L)  and  (H)  mark values outside specified reference range.  ------------------------------------------------------------------- Prepared and Electronically Authenticated by  Ena Dawley, M.D. 2018-01-12T09:17:15   Transesophageal Echocardiography  Patient:    Patricia [name], Patricia [name] MR #:       [id number] Study Date: 06/29/2017 Gender:     F Age:        71 Height:     167.6 cm Weight:     59.9 kg BSA:        1.67 m^2 Pt.  Status: Room:   ADMITTING    Candee Furbish, M.D.  ATTENDING    Candee Furbish, M.D.  ORDERING     Candee Furbish, M.D.  PERFORMING   Candee Furbish, M.D.  SONOGRAPHER  Lauren Pennington, RDCS, CCT  cc:  ------------------------------------------------------------------- LV EF: 55% -   60%  ------------------------------------------------------------------- Indications:      Mitral regurgitation 424.0.  ------------------------------------------------------------------- Study Conclusions  - Left ventricle: The cavity size was normal. Wall thickness was   normal. Systolic function was normal. The estimated ejection   fraction was in the range of 55% to 60%. Wall motion was normal;   there were no regional wall motion abnormalities. - Aortic valve: Mildly thickened, mildly calcified leaflets. No   evidence of vegetation.  There was mild to moderate regurgitation.   Vena contracta 0.27cm2. - Mitral valve: No evidence of vegetation. The findings are   consistent with mild to moderate stenosis. MV mean gradient   5-32mmHg, pressure half time 78- valve area 2.8cm2, Planimetry -   2.3cm2   There was moderate regurgitation directed eccentrically. PISA   0.5cm, ERO 0.1cm2, MR volume 67ml, no pulmonary vein reversal. - Left atrium: No evidence of thrombus in the atrial cavity or   appendage. No evidence of thrombus in the appendage. - Atrial septum: Very few bubbles /delayed appearance of agitated   saline contrast in the LA suggestive of possible transpulmonary   passage or shunt. No significant PFO or ASD. - Tricuspid valve: No evidence of vegetation. - Pulmonic valve: No evidence of vegetation. - Superior vena cava: The study excluded a thrombus.  ------------------------------------------------------------------- Study data:   Study status:  Routine.  Consent:  The risks, benefits, and alternatives to the procedure were explained to the patient and informed consent was obtained.  Procedure:  Initial setup. The patient was brought to the laboratory. Surface ECG leads were monitored. Sedation. Conscious sedation was administered by cardiology staff. Transesophageal echocardiography. An adult multiplane transesophageal probe was inserted by the attending cardiologistwithout difficulty. Image quality was adequate. Intravenous contrast (agitated saline) was administered.  Study completion:  The patient tolerated the procedure well. There were no complications.  Administered medications:   Midazolam, 4mg , IV. Fentanyl, 12mcg, IV.          Diagnostic transesophageal echocardiography.  2D and color Doppler.  Birthdate:  Patient birthdate: [birth year].  Age:  Patient is 66 yr old.  Sex:  Gender: female.    BMI: 21.3 kg/m^2.  Blood pressure:     155/68  Patient status:  Outpatient.  Study date:  Study date:  06/29/2017. Study time: 08:11 AM.  Location:  Endoscopy.  -------------------------------------------------------------------  ------------------------------------------------------------------- Left ventricle:  The cavity size was normal. Wall thickness was normal. Systolic function was normal. The estimated ejection fraction was in the range of 55% to 60%. Wall motion was normal; there were no regional wall motion abnormalities.  ------------------------------------------------------------------- Aortic valve:   Mildly thickened, mildly calcified leaflets. Cusp separation was normal.  No evidence of vegetation.  Doppler:  There was mild to moderate regurgitation. Vena contracta 0.27cm2.  ------------------------------------------------------------------- Aorta:  The aorta was normal, not dilated, and non-diseased.  ------------------------------------------------------------------- Mitral valve:   Structurally normal valve.   Leaflet separation was normal.  No evidence of vegetation.  Doppler:   The findings are consistent with mild to moderate stenosis.   MV mean gradient 5-19mmHg, pressure half time 78- valve area 2.8cm2, Planimetry - 2.3cm2 There was moderate regurgitation directed eccentrically. PISA 0.5cm, ERO 0.1cm2, MR volume 58ml, no pulmonary  vein reversal. Valve area by pressure half-time: 2.72 cm^2. Indexed valve area by pressure half-time: 1.63 cm^2/m^2.    Mean gradient (D): 6 mm Hg.   ------------------------------------------------------------------- Left atrium:  The atrium was normal in size.  No evidence of thrombus in the atrial cavity or appendage.  No evidence of thrombus in the appendage. The appendage was of normal size. Emptying velocity was normal.  ------------------------------------------------------------------- Atrial septum:  Very few bubbles /delayed appearance of agitated saline contrast in the LA suggestive of possible  transpulmonary passage or shunt. No significant PFO or ASD.  ------------------------------------------------------------------- Right ventricle:  The cavity size was normal. Wall thickness was normal. Systolic function was normal.  ------------------------------------------------------------------- Pulmonic valve:    Structurally normal valve.   Cusp separation was normal.  No evidence of vegetation.  Doppler:  There was trivial regurgitation.  ------------------------------------------------------------------- Tricuspid valve:   Structurally normal valve.   Leaflet separation was normal.  No evidence of vegetation.  Doppler:  There was mild regurgitation.  ------------------------------------------------------------------- Pulmonary artery:   The main pulmonary artery was normal-sized.   ------------------------------------------------------------------- Pericardium:  The pericardium was normal in appearance. There was no pericardial effusion.  ------------------------------------------------------------------- Systemic veins: Superior vena cava: The study excluded a thrombus.  ------------------------------------------------------------------- Post procedure conclusions Ascending Aorta:  - The aorta was normal, not dilated, and non-diseased.  ------------------------------------------------------------------- Measurements   Mitral valve                              Value  Mitral mean velocity, D                   116   cm/s  Mitral pressure half-time                 78    ms  Mitral mean gradient, D                   6     mm Hg  Mitral valve area, PHT, DP                2.72  cm^2  Mitral valve area/bsa, PHT, DP            1.63  cm^2/m^2  Mitral annulus VTI, D                     46.2  cm  Mitral maximal regurg velocity, PISA      565   cm/s  Mitral regurg VTI, PISA                   181   cm  Mitral ERO, PISA                          0.1   cm^2  Mitral  regurg volume, PISA                18    ml  Legend: (L)  and  (H)  mark values outside specified reference range.  ------------------------------------------------------------------- Prepared and Electronically Authenticated by  Candee Furbish, M.D. 2018-07-19T09:44:34   Right/Left Heart Cath and Coronary Angiography  Conclusion     There is moderate (3+) mitral regurgitation.   1. No significant coronary disease.  2. Normal LV systolic function.  3. 3+ mitral regurgitation.  4. Severe mitral stenosis.  5. No more than mild aortic stenosis.  6. Mildly elevated  PCWP with near-normal LVEDP.  7. 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  8. Root shot not done to assess aortic insufficiency, will have TEE on Thursday.    Procedural Details/Technique   Technical Details Procedure: Right Heart Cath, Left Heart Cath, Selective Coronary Angiography, LV angiography  Indication: Mitral and aortic valve disease, pre-surgery   Procedural Details: The right groin and right radial area were prepped, draped, and anesthetized with 1% lidocaine. Using the modified Seldinger technique a 7 French sheath was placed in the right femoral vein. A Swan-Ganz catheter was used for the right heart catheterization. Standard protocol was followed for recording of right heart pressures and sampling of oxygen saturations. Fick and thermodilution cardiac output was calculated. The right radial artery was entered using modified Seldinger technique and a 86F sheath was placed. The patient received 3 mg IA verapamil, weight-based IV heparin, and later due to radial vasospasm 200 mcg IA NTG. Standard Judkins catheters were used for selective coronary angiography and left ventriculography. Simultaneous LV and PCWP were measured. There  were no immediate procedural complications. The patient was transferred to the post catheterization recovery area for further monitoring.   Estimated blood loss <50 mL.  During this procedure the patient was administered the following to achieve and maintain moderate conscious sedation: Versed 2 mg, Fentanyl 50 mcg, while the patient's heart rate, blood pressure, and oxygen saturation were continuously monitored. The period of conscious sedation was 53 minutes, of which I was present face-to-face 100% of this time.    Coronary Findings   Dominance: Right  Left Main  Short, no angiographic disease.  Left Anterior Descending  No angiographic CAD.  Left Circumflex  Mild luminal irregularities.  Right Coronary Artery  No angiographic CAD.  Right Heart   Right Heart Pressures RHC Procedural Findings: Hemodynamics (mmHg) RA mean 6 RV 68/8 PA 62/16, mean 37 PCWP mean 18 LV 106/14 AO 94/44  Oxygen saturations: PA 67% AO 99%  Cardiac Output (Fick) 4.15  Cardiac Index (Fick) 2.43 PVR 4.6 WU  Cardiac Output (Thermo) 3.42 Cardiac Index (Thermo) 2  PVR 5.5 WU  Aortic valve peak to peak gradient 12 mmHg  Mitral valve mean gradient 11 mmHg Mitral valve area (continuity equation) 1.07 cm^2    Wall Motion   EF 60-65%, no wall motion abnormalities in the RAO projection.         Left Heart   Mitral Valve There is moderate (3+) mitral regurgitation. 3+ mitral regurgitation.    Implants     No implant documentation for this case.  PACS Images   Show images for Cardiac catheterization   Link to Procedure Log   Procedure Log    Hemo Data    Most Recent Value  Fick Cardiac Output 4.15 L/min  Fick Cardiac Output Index 2.43 (L/min)/BSA  Thermal Cardiac Output 3.42 L/min  Thermal Cardiac Output Index 2 (L/min)/BSA  Mitral Mean Gradient 11.1 mmHg  Mitral Peak Gradient 6 mmHg  Mitral Valve Area Index 0.63 cm2/BSA  RA A Wave 14 mmHg  RA V Wave 8 mmHg  RA Mean 6 mmHg   RV Systolic Pressure 68 mmHg  RV Diastolic Pressure 2 mmHg  RV EDP 8 mmHg  PA Systolic Pressure 62 mmHg  PA Diastolic Pressure 16 mmHg  PA Mean 37 mmHg  PW A Wave 18 mmHg  PW V Wave 26 mmHg  PW Mean 18 mmHg  AO Systolic Pressure 86 mmHg  AO Diastolic Pressure 44 mmHg  AO Mean 61 mmHg  LV Systolic Pressure 505 mmHg  LV Diastolic Pressure 4 mmHg  LV EDP 15 mmHg  Arterial Occlusion Pressure Extended Systolic Pressure 94 mmHg  Arterial Occlusion Pressure Extended Diastolic Pressure 44 mmHg  Arterial Occlusion Pressure Extended Mean Pressure 64 mmHg  Left Ventricular Apex Extended Systolic Pressure 397 mmHg  Left Ventricular Apex Extended Diastolic Pressure 3 mmHg  Left Ventricular Apex Extended EDP Pressure 14 mmHg  QP/QS 1  TPVR Index 15.25 HRUI  TSVR Index 24.32 HRUI  PVR SVR Ratio 0.36  TPVR/TSVR Ratio 0.63      Impression:  Patient has 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 She presents with long-standing history of worsening symptoms of exertional shortness of breath, orthopnea, and PND consistent with chronic diastolic congestive heart failure, New York Heart Association function class III. Symptoms have improved and stabilized on medical therapy but remains significant. I have personally reviewed the patient's recent transesophageal echocardiogram and diagnostic cardiac catheterization. TEE reveals what appears to be primarily moderate restrictive disease involving the mitral valve (functional type IIIa) which I suspect is related to the patient's history of radiation therapy to the chest in the distant past. There is a broad central jet of mitral regurgitation which fills the left atrium. There may be some functional component related to annular dilatation which could wax and wane with degrees of fluid overload. There is only mild mitral stenosis. 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 The aortic insufficiency is moderate (2+) at most.  I agree that mitral valve repair or replacement is indicated, and I suspect that her valve will need to be replaced. Ring annuloplasty of the aortic valve could be contemplated at the time of surgery if the aortic valve is large enough to avoid the development of significant aortic stenosis. Alternatively, mitral valve repair or replacement could be performed via mini thoracotomy approach with plans to leave the aortic valve alone for the time being.   Plan:  I have discussed the nature of the patient's valvular heart disease at length with the patient in the office today. We directly reviewed images from her recent transesophageal echocardiogram and discuss treatment options. As a next step the patient will undergo cardiac gated CT angiography of the heart to further evaluate the aortic valve and aortic root. She will also undergo CT angiography of the aorta and iliac vessels to evaluate the feasibility of peripheral cannulation for surgery. Because of the patient's current situation related to her husband's illness, we will potentially plan to postpone surgical intervention for at least 6-8 weeks as long as she continues to remain clinically stable. She'll return to our office in approximately 6 weeks to review the results of her CT angiograms and potentially make final plans for surgery. All of her questions have been addressed.   I spent in excess of 90 minutes during the conduct of this office consultation and >50% of this time involved direct face-to-face encounter with the patient for counseling and/or coordination of their care.   Valentina Gu. Roxy Manns, MD 07/25/2017 5:06 PM

## 2017-07-25 NOTE — Patient Instructions (Signed)
Continue all previous medications without any changes at this time  

## 2017-07-26 ENCOUNTER — Other Ambulatory Visit: Payer: Self-pay | Admitting: *Deleted

## 2017-07-26 DIAGNOSIS — I7101 Dissection of thoracic aorta: Secondary | ICD-10-CM

## 2017-07-26 DIAGNOSIS — I351 Nonrheumatic aortic (valve) insufficiency: Secondary | ICD-10-CM

## 2017-07-26 DIAGNOSIS — I71019 Dissection of thoracic aorta, unspecified: Secondary | ICD-10-CM

## 2017-08-03 ENCOUNTER — Other Ambulatory Visit: Payer: Self-pay | Admitting: Thoracic Surgery (Cardiothoracic Vascular Surgery)

## 2017-08-03 ENCOUNTER — Ambulatory Visit (HOSPITAL_COMMUNITY): Admission: RE | Admit: 2017-08-03 | Payer: Medicare Other | Source: Ambulatory Visit

## 2017-08-03 ENCOUNTER — Encounter (HOSPITAL_COMMUNITY): Payer: Self-pay | Admitting: General Surgery

## 2017-08-03 ENCOUNTER — Ambulatory Visit (HOSPITAL_COMMUNITY)
Admission: RE | Admit: 2017-08-03 | Discharge: 2017-08-03 | Disposition: A | Discharge status: Home / Self Care | Payer: Medicare Other | Source: Ambulatory Visit | Attending: Thoracic Surgery (Cardiothoracic Vascular Surgery) | Admitting: Thoracic Surgery (Cardiothoracic Vascular Surgery)

## 2017-08-03 ENCOUNTER — Other Ambulatory Visit (HOSPITAL_COMMUNITY): Payer: Self-pay | Admitting: General Surgery

## 2017-08-03 ENCOUNTER — Ambulatory Visit (HOSPITAL_COMMUNITY)
Admission: RE | Admit: 2017-08-03 | Discharge: 2017-08-03 | Disposition: A | Discharge status: Home / Self Care | Payer: Medicare Other | Source: Ambulatory Visit | Attending: General Surgery | Admitting: General Surgery

## 2017-08-03 DIAGNOSIS — I351 Nonrheumatic aortic (valve) insufficiency: Secondary | ICD-10-CM

## 2017-08-03 DIAGNOSIS — I700 Atherosclerosis of aorta: Secondary | ICD-10-CM | POA: Insufficient documentation

## 2017-08-03 DIAGNOSIS — Z789 Other specified health status: Secondary | ICD-10-CM

## 2017-08-03 DIAGNOSIS — I71019 Dissection of thoracic aorta, unspecified: Secondary | ICD-10-CM

## 2017-08-03 DIAGNOSIS — I7101 Dissection of thoracic aorta: Secondary | ICD-10-CM | POA: Diagnosis not present

## 2017-08-03 DIAGNOSIS — Z9012 Acquired absence of left breast and nipple: Secondary | ICD-10-CM | POA: Diagnosis not present

## 2017-08-03 DIAGNOSIS — I350 Nonrheumatic aortic (valve) stenosis: Secondary | ICD-10-CM | POA: Diagnosis not present

## 2017-08-03 HISTORY — PX: IR RADIOLOGY PERIPHERAL GUIDED IV START: IMG5598

## 2017-08-03 HISTORY — PX: IR US GUIDE VASC ACCESS RIGHT: IMG2390

## 2017-08-03 MED ORDER — NITROGLYCERIN 0.4 MG SL SUBL: 0.8000 mg | SUBLINGUAL_TABLET | Freq: Once | SUBLINGUAL | Status: DC

## 2017-08-03 MED ORDER — METOPROLOL TARTRATE 5 MG/5ML IV SOLN
INTRAVENOUS | Status: AC
  Filled 2017-08-03: qty 15

## 2017-08-03 MED ORDER — LIDOCAINE HCL (PF) 1 % IJ SOLN
INTRAMUSCULAR | Status: AC
  Filled 2017-08-03: qty 5

## 2017-08-03 MED ORDER — IOPAMIDOL (ISOVUE-370) INJECTION 76%
INTRAVENOUS | Status: AC
  Filled 2017-08-03: qty 100

## 2017-08-03 MED ORDER — METOPROLOL TARTRATE 5 MG/5ML IV SOLN
5.0000 mg | INTRAVENOUS | Status: DC | PRN
  Administered 2017-08-03 (×3): 5 mg via INTRAVENOUS

## 2017-08-03 MED ORDER — IOPAMIDOL (ISOVUE-370) INJECTION 76%
INTRAVENOUS | Status: AC
  Administered 2017-08-03: 100 mL
  Filled 2017-08-03: qty 50

## 2017-09-04 ENCOUNTER — Encounter: Payer: Self-pay | Admitting: Thoracic Surgery (Cardiothoracic Vascular Surgery)

## 2017-09-04 ENCOUNTER — Ambulatory Visit (INDEPENDENT_AMBULATORY_CARE_PROVIDER_SITE_OTHER): Payer: Medicare Other | Admitting: Thoracic Surgery (Cardiothoracic Vascular Surgery)

## 2017-09-04 ENCOUNTER — Other Ambulatory Visit: Payer: Self-pay | Admitting: *Deleted

## 2017-09-04 VITALS — BP 145/64 | HR 82 | Resp 16 | Ht 66.0 in | Wt 130.0 lb

## 2017-09-04 DIAGNOSIS — I340 Nonrheumatic mitral (valve) insufficiency: Secondary | ICD-10-CM

## 2017-09-04 DIAGNOSIS — I080 Rheumatic disorders of both mitral and aortic valves: Secondary | ICD-10-CM | POA: Diagnosis not present

## 2017-09-04 DIAGNOSIS — I351 Nonrheumatic aortic (valve) insufficiency: Secondary | ICD-10-CM

## 2017-09-04 MED ORDER — AMIODARONE HCL 200 MG PO TABS: 200.0000 mg | ORAL_TABLET | Freq: Every day | ORAL | 0 refills | Status: DC

## 2017-09-04 NOTE — Patient Instructions (Signed)
Stop taking aspirin and begin taking amiodarone 7 days prior to surgery  Continue taking all other medications without change through the day before surgery.  Have nothing to eat or drink after midnight the night before surgery.  On the morning of surgery take only metoprolol and Nexium with a sip of water.

## 2017-09-04 NOTE — Progress Notes (Signed)
[address]       [place] [address]             [phone]     CARDIOTHORACIC SURGERY OFFICE NOTE  Referring Provider is Minus Breeding, MD PCP is Susy Frizzle, MD   HPI:  Patient returns to the office today for follow-up of severe symptomatic primary mitral regurgitation with mild to moderate aortic insufficiency. She was originally seen in consultation on 07/25/2017. Since then she underwent cardiac gated CT of the heart. She reports no new problems or complaints although she does note that her exertional shortness of breath may be getting a little bit worse. The remainder of her review of systems is unchanged from previously.   Current Outpatient Prescriptions  Medication Sig Dispense Refill  . acetaminophen (TYLENOL) 500 MG tablet Take 1,000 mg by mouth 2 (two) times daily as needed for moderate pain or headache.    Marland Kitchen aspirin EC 81 MG tablet Take 81 mg by mouth 3 (three) times a week.    Marland Kitchen CALCIUM PO Take 2,000 mg by mouth daily.    . cetirizine (ZYRTEC) 10 MG tablet Take 10 mg by mouth daily as needed for allergies.    . chlorthalidone (HYGROTON) 25 MG tablet Take 1 tablet (25 mg total) by mouth daily. 90 tablet 3  . esomeprazole (NEXIUM) 40 MG capsule Take 40 mg by mouth every morning.  12  . latanoprost (XALATAN) 0.005 % ophthalmic solution Place 1 drop into both eyes at bedtime.     . lipase/protease/amylase (CREON) 12000 UNITS CPEP capsule Take 1 capsule (12,000 Units total) by mouth 3 (three) times daily with meals. 270 capsule 1  . metoprolol (LOPRESSOR) 50 MG tablet TAKE 1 TABLET (50 MG TOTAL) BY MOUTH 2 (TWO) TIMES DAILY. 180 tablet 3  . Multiple Vitamin (MULTIVITAMIN WITH MINERALS) TABS Take 1 tablet by mouth daily.    Glory Rosebush DELICA LANCETS 62X MISC TEST SUGAR DAILY  4  . ONETOUCH VERIO test strip CHECK SUGAR DAILY  12   No current facility-administered medications for this visit.       Physical Exam:   BP (!) 145/64 (BP  Location: Right Arm, Patient Position: Sitting, Cuff Size: Large)   Pulse 82   Resp 16   Ht 5\' 6"  (1.676 m)   Wt 130 lb (59 kg)   SpO2 98% Comment: ON RA  BMI 20.98 kg/m   General:  Well-appearing  Chest:   Clear to auscultation  CV:   Regular rate and rhythm with prominent holosystolic murmur  Incisions:  n/a  Abdomen:  Soft and nontender  Extremities:  Warm and well-perfused  Diagnostic Tests:  Cardiac TAVR CT  TECHNIQUE: The patient was scanned on a Philips 256 scanner. A 120 kV retrospective scan was triggered in the descending thoracic aorta at 111 HU's. Gantry rotation speed was 270 msecs and collimation was .9 mm. No beta blockade or nitro were given. The 3D data set was reconstructed in 5% intervals of the R-R cycle. Systolic and diastolic phases were analyzed on a dedicated work station using MPR, MIP and VRT modes. The patient received 80 cc of contrast.  FINDINGS: Aortic Valve:  Trileaflet, mild thickening, no calcifications.  Aorta:  Normal size, mild diffuse calcifications.  No dissection.  Sinotubular Junction:  23 x 22 mm  Ascending Thoracic Aorta:  23 x 22 mm  Aortic Arch:  20 x 19 mm  Descending Thoracic Aorta:  16 x  16 mm  Sinus of Valsalva Measurements:  Non-coronary:  26 mm  Right -coronary:  26 mm  Left -coronary:  28 mm  Coronary Artery Height above Annulus:  Left Main:  12 mm  Right Coronary:  15 mm  Virtual Basal Annulus Measurements:  Maximum/Minimum Diameter:  24 x 17 mm  Perimeter:  64 mm  Area:  312 mm2  Coronary Arteries: Normal origin, right dominance. Mild non-obstructive plaque.  Normal size of the pulmonary artery.  Normal pulmonary vein drainage into the left atrium.  No left atrial appendage thrombus.  Mitral annular calcifications.  IMPRESSION: 1.Trileaflet aortic valve with mild thickening and no calcifications.  2. Thoracic aorta has normal size with mild diffuse  calcifications and dissection.  3. No left atrial appendage thrombus.  4. Mitral annular calcifications.  Ena Dawley   Electronically Signed   By: Ena Dawley   On: 08/11/2017 18:24   Impression:  Patient has stage D severe symptomatic primary mitral regurgitation with mild to moderate aortic insufficiency. She presents with long-standing history of worsening symptoms of exertional shortness of breath, orthopnea, and PND consistent with chronic diastolic congestive heart failure, New York Heart Association function class III.  I have personally reviewed the patient's recent transesophageal echocardiogram and diagnostic cardiac catheterization. TEE reveals what appears to be primarily moderate restrictive disease involving the mitral valve (functional type IIIa) which I suspect is related to the patient's history of radiation therapy to the chest in the distant past. There is a broad central jet of mitral regurgitation which fills the left atrium. There may be some functional component related to annular dilatation which could wax and wane with degrees of fluid overload. There is only mild mitral stenosis. 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 The aortic insufficiency is mild (1+) and perhaps moderate (2+) at most.  I agree that mitral valve repair or replacement is indicated, and I suspect that her valve may need to be replaced. Ring annuloplasty of the aortic valve could be contemplated at the time of surgery, but her aortic annulus is quite small and this might create significant aortic stenosis. I favor leaving the aortic valve alone at this time.    Plan:  The patient was again counseled at length regarding the indications, risks and potential benefits of mitral valve repair or replacement.  The rationale for elective surgery has been explained, including a comparison between surgery and continued medical therapy with close  follow-up.  The likelihood of successful and durable valve repair has been discussed with particular reference to the findings of their recent echocardiogram.  Based upon these findings and previous experience, I have quoted her a less than 50 percent likelihood of successful valve repair.  Assuming that her valve cannot be successfully repaired, we discussed the possibility of replacing the mitral valve using a mechanical prosthesis with the attendant need for long-term anticoagulation versus the alternative of replacing it using a bioprosthetic tissue valve with its potential for late structural valve deterioration and failure, depending upon the patient's longevity.  The patient specifically requests that if the mitral valve must be replaced that it be done using a bioprosthetic valve.   The patient understands and accepts all potential risks of surgery including but not limited to risk of death, stroke or other neurologic complication, myocardial infarction, congestive heart failure, respiratory failure, renal failure, bleeding requiring transfusion and/or reexploration, arrhythmia, infection or other wound complications, pneumonia, pleural and/or pericardial effusion, pulmonary embolus, aortic dissection  or other major vascular complication, or delayed complications related to valve repair or replacement including but not limited to structural valve deterioration and failure, thrombosis, embolization, endocarditis, or paravalvular leak.  Alternative surgical approaches have been discussed including a comparison between conventional sternotomy and minimally-invasive techniques.  The relative risks and benefits of each have been reviewed as they pertain to the patient's specific circumstances, and all of their questions have been addressed.  Specific risks potentially related to the minimally-invasive approach were discussed at length, including but not limited to risk of conversion to full or partial sternotomy,  aortic dissection or other major vascular complication, unilateral acute lung injury or pulmonary edema, phrenic nerve dysfunction or paralysis, rib fracture, chronic pain, lung hernia, or lymphocele.  All of her questions have been answered.  Patient desires to proceed with surgery as soon as practical. We tented we plan to proceed with minimally invasive mitral valve repair or replacement on Wednesday, 09/13/2017. I have given the patient a prescription for amiodarone to begin 1 week prior to surgery to decrease her risk of perioperative atrial dysrhythmias.  I spent in excess of 15 minutes during the conduct of this office consultation and >50% of this time involved direct face-to-face encounter with the patient for counseling and/or coordination of their care.    Valentina Gu. Roxy Manns, MD 09/04/2017 3:54 PM

## 2017-09-08 NOTE — Pre-Procedure Instructions (Signed)
Patricia [name]  09/08/2017      [id number] [place], [place] - [address] [facility] [address] [address] [place] [address] Phone: [phone] Fax: [phone]    Your procedure is scheduled on October 3rd, Wednesday.   Report to [facility] Admitting at 6:30 AM             (posted surgery time 8:30a-2:45p)   Call this number if you have problems the MORNING of surgery:  [phone].   Remember:              4-5 days prior to surgery, STOP TAKING any Vitamins, Herbal Supplements, Anti-inflammatories   Do not eat food or drink liquids after midnight Tuesday.   Take these medicines the morning of surgery with A SIP OF WATER :  Pacerone, Amlodipine, Nexium, Metoprolol   Do not wear jewelry, make-up or nail polish.  Do not wear lotions, powders, perfumes, or deoderant.  Do not shave 48 hours prior to surgery.     Do not bring valuables to the hospital.  [facility] is not responsible for any belongings or valuables.  Contacts, dentures or bridgework may not be worn into surgery.  Leave your suitcase in the car.  After surgery it may be brought to your room.  For patients admitted to the hospital, discharge time will be determined by your treatment team.  Please read over the following fact sheets that you were given. Pain Booklet, MRSA Information and Surgical Site Infection Prevention      [place]- Preparing For Surgery  Before surgery, you can play an important role. Because skin is not sterile, your skin needs to be as free of germs as possible. You can reduce the number of germs on your skin by washing with CHG (chlorahexidine gluconate) Soap before surgery.  CHG is an antiseptic cleaner which kills germs and bonds with the skin to continue killing germs even after washing.  Please do not use if you have an allergy to CHG or antibacterial soaps. If your skin becomes reddened/irritated stop using the CHG.  Do  not shave (including legs and underarms) for at least 48 hours prior to first CHG shower. It is OK to shave your face.  Please follow these instructions carefully.   1. Shower the NIGHT BEFORE SURGERY and the MORNING OF SURGERY with CHG.   2. If you chose to wash your hair, wash your hair first as usual with your normal shampoo.  3. After you shampoo, rinse your hair and body thoroughly to remove the shampoo.  4. Use CHG as you would any other liquid soap. You can apply CHG directly to the skin and wash gently with a scrungie or a clean washcloth.   5. Apply the CHG Soap to your body ONLY FROM THE NECK DOWN.  Do not use on open wounds or open sores. Avoid contact with your eyes, ears, mouth and genitals (private parts). Wash genitals (private parts) with your normal soap.  6. Wash thoroughly, paying special attention to the area where your surgery will be performed.  7. Thoroughly rinse your body with warm water from the neck down.  8. DO NOT shower/wash with your normal soap after using and rinsing off the CHG Soap.  9. Pat yourself dry with a CLEAN TOWEL.   10. Wear CLEAN PAJAMAS   11. Place CLEAN SHEETS on your bed the night of your first shower and DO NOT  SLEEP WITH PETS.    Day of Surgery: Do not apply any deodorants/lotions. Please wear clean clothes to the hospital/surgery center.

## 2017-09-11 ENCOUNTER — Ambulatory Visit (HOSPITAL_COMMUNITY)
Admission: RE | Admit: 2017-09-11 | Discharge: 2017-09-11 | Disposition: A | Discharge status: Home / Self Care | Payer: Medicare Other | Source: Ambulatory Visit | Attending: Thoracic Surgery (Cardiothoracic Vascular Surgery) | Admitting: Thoracic Surgery (Cardiothoracic Vascular Surgery)

## 2017-09-11 ENCOUNTER — Ambulatory Visit (HOSPITAL_BASED_OUTPATIENT_CLINIC_OR_DEPARTMENT_OTHER)
Admission: RE | Admit: 2017-09-11 | Discharge: 2017-09-11 | Disposition: A | Discharge status: Home / Self Care | Payer: Medicare Other | Source: Ambulatory Visit | Attending: Thoracic Surgery (Cardiothoracic Vascular Surgery) | Admitting: Thoracic Surgery (Cardiothoracic Vascular Surgery)

## 2017-09-11 ENCOUNTER — Encounter (HOSPITAL_COMMUNITY)
Admission: RE | Admit: 2017-09-11 | Discharge: 2017-09-11 | Disposition: A | Discharge status: Home / Self Care | Payer: Medicare Other | Source: Ambulatory Visit | Attending: Thoracic Surgery (Cardiothoracic Vascular Surgery) | Admitting: Thoracic Surgery (Cardiothoracic Vascular Surgery)

## 2017-09-11 ENCOUNTER — Ambulatory Visit (HOSPITAL_COMMUNITY): Admission: RE | Admit: 2017-09-11 | Payer: Medicare Other | Source: Ambulatory Visit

## 2017-09-11 ENCOUNTER — Encounter (HOSPITAL_COMMUNITY): Payer: Self-pay

## 2017-09-11 DIAGNOSIS — Z01810 Encounter for preprocedural cardiovascular examination: Secondary | ICD-10-CM

## 2017-09-11 DIAGNOSIS — J449 Chronic obstructive pulmonary disease, unspecified: Secondary | ICD-10-CM

## 2017-09-11 DIAGNOSIS — Z8571 Personal history of Hodgkin lymphoma: Secondary | ICD-10-CM

## 2017-09-11 DIAGNOSIS — I340 Nonrheumatic mitral (valve) insufficiency: Secondary | ICD-10-CM | POA: Diagnosis not present

## 2017-09-11 DIAGNOSIS — I700 Atherosclerosis of aorta: Secondary | ICD-10-CM

## 2017-09-11 DIAGNOSIS — Z853 Personal history of malignant neoplasm of breast: Secondary | ICD-10-CM

## 2017-09-11 DIAGNOSIS — I059 Rheumatic mitral valve disease, unspecified: Secondary | ICD-10-CM | POA: Insufficient documentation

## 2017-09-11 DIAGNOSIS — Z01818 Encounter for other preprocedural examination: Secondary | ICD-10-CM

## 2017-09-11 DIAGNOSIS — M5134 Other intervertebral disc degeneration, thoracic region: Secondary | ICD-10-CM

## 2017-09-11 HISTORY — DX: Gastro-esophageal reflux disease without esophagitis: K21.9

## 2017-09-11 HISTORY — DX: Chronic or unspecified gastric ulcer with hemorrhage: K25.4

## 2017-09-11 HISTORY — DX: Pneumonia, unspecified organism: J18.9

## 2017-09-11 LAB — PULMONARY FUNCTION TEST
DL/VA % pred: 70 %
DL/VA: 3.56 ml/min/mmHg/L
DLCO unc % pred: 50 %
DLCO unc: 13.66 ml/min/mmHg
FEF 25-75 Post: 1.5 L/sec
FEF 25-75 Pre: 1.34 L/sec
FEF2575-%Change-Post: 12 %
FEF2575-%Pred-Post: 69 %
FEF2575-%Pred-Pre: 61 %
FEV1-%Change-Post: 3 %
FEV1-%Pred-Post: 73 %
FEV1-%Pred-Pre: 70 %
FEV1-Post: 1.88 L
FEV1-Pre: 1.8 L
FEV1FVC-%Change-Post: 4 %
FEV1FVC-%Pred-Pre: 96 %
FEV6-%Change-Post: 0 %
FEV6-%Pred-Post: 75 %
FEV6-%Pred-Pre: 75 %
FEV6-Post: 2.41 L
FEV6-Pre: 2.43 L
FEV6FVC-%Pred-Post: 104 %
FEV6FVC-%Pred-Pre: 104 %
FVC-%Change-Post: 0 %
FVC-%Pred-Post: 72 %
FVC-%Pred-Pre: 72 %
FVC-Post: 2.41 L
FVC-Pre: 2.43 L
Post FEV1/FVC ratio: 78 %
Post FEV6/FVC ratio: 100 %
Pre FEV1/FVC ratio: 74 %
Pre FEV6/FVC Ratio: 100 %
RV % pred: 95 %
RV: 2.12 L
TLC % pred: 85 %
TLC: 4.56 L

## 2017-09-11 LAB — URINALYSIS, ROUTINE W REFLEX MICROSCOPIC
Bilirubin Urine: NEGATIVE
Glucose, UA: NEGATIVE mg/dL
Hgb urine dipstick: NEGATIVE
Ketones, ur: NEGATIVE mg/dL
Leukocytes, UA: NEGATIVE
Nitrite: NEGATIVE
Protein, ur: NEGATIVE mg/dL
Specific Gravity, Urine: 1.006 (ref 1.005–1.030)
pH: 6 (ref 5.0–8.0)

## 2017-09-11 LAB — CBC
HCT: 37.8 % (ref 36.0–46.0)
Hemoglobin: 12.8 g/dL (ref 12.0–15.0)
MCH: 29.4 pg (ref 26.0–34.0)
MCHC: 33.9 g/dL (ref 30.0–36.0)
MCV: 86.7 fL (ref 78.0–100.0)
Platelets: 490 10*3/uL — ABNORMAL HIGH (ref 150–400)
RBC: 4.36 MIL/uL (ref 3.87–5.11)
RDW: 14.6 % (ref 11.5–15.5)
WBC: 9.4 10*3/uL (ref 4.0–10.5)

## 2017-09-11 LAB — COMPREHENSIVE METABOLIC PANEL
ALT: 22 U/L (ref 14–54)
AST: 29 U/L (ref 15–41)
Albumin: 4.4 g/dL (ref 3.5–5.0)
Alkaline Phosphatase: 62 U/L (ref 38–126)
Anion gap: 11 (ref 5–15)
BUN: 16 mg/dL (ref 6–20)
CO2: 25 mmol/L (ref 22–32)
Calcium: 9.4 mg/dL (ref 8.9–10.3)
Chloride: 98 mmol/L — ABNORMAL LOW (ref 101–111)
Creatinine, Ser: 0.99 mg/dL (ref 0.44–1.00)
GFR calc Af Amer: >60 mL/min (ref >60)
GFR calc non Af Amer: 58 mL/min — ABNORMAL LOW (ref >60)
Glucose, Bld: 119 mg/dL — ABNORMAL HIGH (ref 65–99)
Potassium: 3.7 mmol/L (ref 3.5–5.1)
Sodium: 134 mmol/L — ABNORMAL LOW (ref 135–145)
Total Bilirubin: 0.6 mg/dL (ref 0.3–1.2)
Total Protein: 7.1 g/dL (ref 6.5–8.1)

## 2017-09-11 LAB — PROTIME-INR
INR: 0.91
Prothrombin Time: 12.2 seconds (ref 11.4–15.2)

## 2017-09-11 LAB — SURGICAL PCR SCREEN
MRSA, PCR: NEGATIVE
Staphylococcus aureus: NEGATIVE

## 2017-09-11 LAB — BLOOD GAS, ARTERIAL
Acid-Base Excess: 3.1 mmol/L — ABNORMAL HIGH (ref 0.0–2.0)
Bicarbonate: 27.2 mmol/L (ref 20.0–28.0)
Drawn by: 421801
FIO2: 21
O2 Saturation: 97.6 %
Patient temperature: 98.6
pCO2 arterial: 42.5 mmHg (ref 32.0–48.0)
pH, Arterial: 7.423 (ref 7.350–7.450)
pO2, Arterial: 98.4 mmHg (ref 83.0–108.0)

## 2017-09-11 LAB — HEMOGLOBIN A1C
Hgb A1c MFr Bld: 6.1 % — ABNORMAL HIGH (ref 4.8–5.6)
Mean Plasma Glucose: 128.37 mg/dL

## 2017-09-11 LAB — APTT: aPTT: 27 seconds (ref 24–36)

## 2017-09-11 LAB — ABO/RH: ABO/RH(D): O POS

## 2017-09-11 MED ORDER — ALBUTEROL SULFATE (2.5 MG/3ML) 0.083% IN NEBU
2.5000 mg | INHALATION_SOLUTION | Freq: Once | RESPIRATORY_TRACT | Status: AC
  Administered 2017-09-11: 2.5 mg via RESPIRATORY_TRACT

## 2017-09-11 NOTE — Progress Notes (Signed)
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  Had radiation treatment in 73 & 75. PCP is Dr. Viona Gilmore. [place]  LOV 04/2017   Cardio is Dr. Percival Spanish  LOV 07/2017 Echo 12/2016 Cath 06/2017 PFT's done 09/11/2017 Dopplers 09/11/2017

## 2017-09-11 NOTE — Progress Notes (Signed)
Pre-op Cardiac Surgery  Carotid Findings:  1-39% ICA plaquing. Vertebral artery flow is antegrade.  Upper Extremity Right Left  Brachial Pressures 150T T  Radial Waveforms T T  Ulnar Waveforms T T  Palmar Arch (Allen's Test) Doppler signal remains normal with radial compression and diminishes 50% with ulnar compression. Doppler signal remains normal with radial compression and obliterates with ulnar compression.    Findings:      Lower  Extremity Right Left  Dorsalis Pedis    Anterior Tibial    Posterior Tibial    Ankle/Brachial Indices      Findings:

## 2017-09-11 NOTE — H&P (Signed)
[address]       [place] [address]             [phone]          CARDIOTHORACIC SURGERY HISTORY AND PHYSICAL EXAM  Referring Provider is Minus Breeding, MD PCP is Susy Frizzle, MD      Chief Complaint  Patient presents with  . Mitral Regurgitation    Surgical eval, Cardiac Cath 06/27/17, TEE 06/29/17  . 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  The patient has been followed for the last several years by Dr. Percival Spanish with known history of mitral stenosis, mitral regurgitation, and aortic insufficiency. Over the past 3 years or so the patient reports progressive symptoms of exertional fatigue and shortness of breath. Symptoms became considerably worse in January of this year at which time she presented with shortness of breath with minimal activity and occasionally at rest with severe orthopnea and episodes of PND. Echocardiogram performed at that time revealed normal left ventricular systolic function with moderate aortic insufficiency and what was felt to be severe mitral stenosis and moderate to severe mitral regurgitation. Medications were adjusted with some degree of symptomatic improvement. She underwent left and right heart catheterization on 06/27/2017 revealing no significant coronary artery disease. There was moderate  (3+) mitral regurgitation, severe mitral stenosis with mean transvalvular gradient estimated 11 mmHg, and moderate pulmonary hypertension. The patient underwent transesophageal echocardiogram demonstrating only mild to moderate central aortic insufficiency with mild mitral stenosis and moderate to severe mitral regurgitation. The patient was referred for surgical consultation and has been evaluated previously by Dr. Cyndia Bent on 07/07/2017. The patient has been referred for second surgical opinion to consider the possibility of minimally invasive mitral valve repair or replacement.  The patient is married and lives locally in [place] with her husband who suffers from pancreatic cancer and is currently hospitalized on hospice care with very short life expectancy. The patient states that over the last several months she has been quite stable clinically since her medications were adjusted by Dr. Percival Spanish earlier in the year. She still gets short of breath with moderate low-level activity. She sleeps on 3 pillows at night and cannot lay flat in bed. She denies any resting shortness of breath. She has not had significant chest pain or lower extremity edema and she has had occasional dizzy spells and several syncopal episodes although none recently.     Past Medical History:  Diagnosis Date  . Atrial flutter (HCC)    Ablated 1998 Dr. Caryl Comes  . Bleeding gastric ulcer    back in 2000  . Breast cancer ([facility])   . COPD (chronic obstructive pulmonary disease) (HCC)    from radiation  . GERD (gastroesophageal reflux disease)   . Hodgkin's lymphoma ([facility])    Treated with radiation and chemo  . Mitral regurgitation    pvc  . Osteoporosis    lumbar verterbral fracture, left ankle fracture, dexa 2015  .  Pancreatitis   . Pneumonia   . Prediabetes   . Ulcer     Past Surgical History:  Procedure Laterality Date  . BREAST SURGERY     breast cancer since 2005   left mastectomy  . CARDIAC  ELECTROPHYSIOLOGY [place] AND ABLATION  1999  . CATARACT EXTRACTION, BILATERAL    . COLONOSCOPY    . IR RADIOLOGY PERIPHERAL GUIDED IV START  08/03/2017  . IR US GUIDE VASC ACCESS RIGHT  08/03/2017  . LAPAROTOMY    . MASTECTOMY  2005   Left-axillary  . ORIF ANKLE FRACTURE Left 03/21/2014   Procedure: LEFT ANKLE FRACTURE OPEN TREATMENT BILMALLEOLAR INCLUDES INTERNAL FIXATION ;  Surgeon: Renette Butters, MD;  Location: [facility];  Service: Orthopedics;  Laterality: Left;  . RIGHT/LEFT HEART CATH AND CORONARY ANGIOGRAPHY N/A 06/27/2017   Procedure: Right/Left Heart Cath and Coronary Angiography;  Surgeon: Larey [name], MD;  Location: [facility];  Service: Cardiovascular;  Laterality: N/A;  . 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 TEE WITHOUT CARDIOVERSION  06/12/2012   Procedure: TRANSESOPHAGEAL ECHOCARDIOGRAM (TEE);  Surgeon: Larey [name], MD;  Location: [facility];  Service: Cardiovascular;  Laterality: N/A;  . TEE WITHOUT CARDIOVERSION N/A 06/29/2017   Procedure: TRANSESOPHAGEAL ECHOCARDIOGRAM (TEE);  Surgeon: Jerline Pain, MD;  Location[facility] END[facility]COPY;  Service: Cardiovascular;  Laterality: N/A;  . TONSILLECTOMY AND ADENOIDECTOMY      Family History  Problem Relation Age of Onset  . Rheumatic fever Father        Died suddenly age 19  . Diabetes Father   . Cancer Father        colon  . Heart disease Father        rheumatic fever x 3  . Arthritis Maternal Grandmother   . Hypertension Maternal Grandmother   . Diabetes Paternal Grandmother   . Vision loss Paternal Grandmother        glaucoma    Social History Social History  Substance Use Topics  . Smoking status: Never Smoker  . Smokeless tobacco: Never Used  . Alcohol use No    Prior to Admission medications   Medication Sig Start Date End Date Taking? Authorizing Provider  acetaminophen (TYLENOL) 500 MG tablet Take 1,000 mg by mouth 2 (two) times daily as needed for moderate pain or  headache.   Yes [provider]  amiodarone (PACERONE) 200 MG tablet Take 1 tablet (200 mg total) by mouth daily. Patient taking differently: Take 200 mg by mouth daily with lunch.  09/04/17  Yes Rexene Alberts, MD  amLODipine (NORVASC) 2.5 MG tablet Take 2.5 mg by mouth daily.   Yes [provider]  Calcium Carbonate-Vitamin D (CALCIUM PLUS VITAMIN D PO) Take 2 tablets by mouth daily.   Yes [provider]  cetirizine (ZYRTEC) 10 MG tablet Take 10 mg by mouth daily as needed for allergies.   Yes [provider]  chlorthalidone (HYGROTON) 25 MG tablet Take 1 tablet (25 mg total) by mouth daily. 06/23/17  Yes Minus Breeding, MD  esomeprazole (NEXIUM) 40 MG capsule Take 40 mg by mouth daily before breakfast.  07/02/15  Yes [provider]  latanoprost (XALATAN) 0.005 % ophthalmic solution Place 1 drop into both eyes at bedtime.  03/11/14  Yes [provider]  lipase/protease/amylase (CREON) 12000 UNITS CPEP capsule Take 1 capsule (12,000 Units total) by mouth 3 (three) times daily with meals. Patient taking differently: Take 12,000 Units by mouth 3 (  three) times daily before meals.  01/07/15  Yes Debbe Odea, MD  metoprolol (LOPRESSOR) 50 MG tablet TAKE 1 TABLET (50 MG TOTAL) BY MOUTH 2 (TWO) TIMES DAILY. 12/20/16  Yes Minus Breeding, MD  Multiple Vitamin (MULTIVITAMIN WITH MINERALS) TABS tablet Take 1 tablet by mouth daily. Centrum Silver.   Yes [provider]  Jonetta Speak LANCETS 14H MISC TEST SUGAR ONCE A WEEK 09/07/15  Yes [provider]  ONETOUCH VERIO test strip [place] 09/07/15  Yes [provider]  aspirin EC 81 MG tablet Take 81 mg by mouth 3 (three) times a week.    [provider]    Allergies  Allergen Reactions  . Penicillins Anaphylaxis, Hives and Swelling    Has patient had a PCN reaction causing immediate rash, facial/tongue/throat swelling, SOB or lightheadedness with  hypotension: Yes Has patient had a PCN reaction causing severe rash involving mucus membranes or skin necrosis: Yes Has patient had a PCN reaction that required hospitalization: No Has patient had a PCN reaction occurring within the last 10 years: No If all of the above answers are "NO", then may proceed with Cephalosporin use.   . Azithromycin Other (See Comments)    pancreatitis  . Other     Cheese - nausea and vomiting   . Sulfa Antibiotics Hives and Rash     Review of Systems:              General:                      normal appetite, decreased energy, no weight gain, no weight loss, no fever             Cardiac:                       no chest pain with exertion, no chest pain at rest, + SOB with exertion, occasional resting SOB, + PND, + orthopnea, no palpitations, no arrhythmia, no atrial fibrillation, no LE edema, + dizzy spells, + syncope             Respiratory:                 + shortness of breath, no home oxygen, no productive cough, no dry cough, no bronchitis, no wheezing, no hemoptysis, no asthma, no pain with inspiration or cough, no sleep apnea, no CPAP at night             GI:                               no difficulty swallowing, no reflux, no frequent heartburn, no hiatal hernia, no abdominal pain, occasional constipation, no diarrhea, no hematochezia, no hematemesis, no melena             GU:                              no dysuria,  no frequency, no urinary tract infection, no hematuria, no  kidney stones, no kidney disease             Vascular:                     no pain suggestive of claudication, no pain in feet, no leg cramps, no varicose veins, no DVT, no non-healing foot  ulcer             Neuro:                         no stroke, no TIA's, no seizures, no headaches, no temporary blindness one eye,  no slurred speech, no peripheral neuropathy, no chronic pain, no instability of gait, no memory/cognitive dysfunction             Musculoskeletal:         mild  arthritis, no joint swelling, no myalgias, no difficulty walking, normal mobility              Skin:                            no rash, no itching, no skin infections, no pressure sores or ulcerations             Psych:                         no anxiety, + depression, no nervousness, + unusual recent stress             Eyes:                           no blurry vision, no floaters, no recent vision changes, + wears glasses or contacts             ENT:                            no hearing loss, no loose or painful teeth, no dentures, last saw dentist within the past 6 months             Hematologic:               no easy bruising, no abnormal bleeding, no clotting disorder, no frequent epistaxis             Endocrine:                   + "borderline" diabetes, does not check CBG's at home                           Physical Exam:              BP 121/70   Pulse 92   Resp 18   Ht 5\' 6"  (1.676 m)   Wt 134 lb (60.8 kg)   SpO2 99% Comment: RA  BMI 21.63 kg/m              General:                        well-appearing             HEENT:                       Unremarkable              Neck:                           no JVD, no bruits, no adenopathy  Chest:                          clear to auscultation, symmetrical breath sounds, no wheezes, no rhonchi              CV:                              RRR, grade III/VI systolic murmur best at apex, no diastolic murmur             Abdomen:                    soft, non-tender, no masses              Extremities:                 warm, well-perfused, pulses diminished but palpable, no LE edema             Rectal/GU                   Deferred             Neuro:                         Grossly non-focal and symmetrical throughout             Skin:                            Clean and dry, no rashes, no breakdown   Diagnostic Tests:  Transthoracic Echocardiography  Patient: Patricia [name], Bhat MR #: [id number] Study Date:  12/23/2016 Gender: F Age: 42 Height: 167.6 cm Weight: 66.7 kg BSA: 1.77 m^2 Pt. Status: Room:  ORDERING Minus Breeding, MD REFERRING Minus Breeding, MD SONOGRAPHER [place], Will ATTENDING Ena Dawley, M.D. PERFORMING [facility], Outpatient  cc:  ------------------------------------------------------------------- LV EF: 55% - 60%  ------------------------------------------------------------------- Indications: (I34.0).  ------------------------------------------------------------------- History: PMH: Acquired from the patient and from the patient&'s chart. Dyspnea. Atrial flutter. Mitral valve disease. Mitral stenosis. Mitral valve prolapse.  ------------------------------------------------------------------- Study Conclusions  - Left ventricle: The cavity size was normal. There was mild focal basal hypertrophy of the septum. Systolic function was normal. The estimated ejection fraction was in the range of 55% to 60%. Wall motion was normal; there were no regional wall motion abnormalities. Features are consistent with a pseudonormal left ventricular filling pattern, with concomitant abnormal relaxation and increased filling pressure (grade 2 diastolic dysfunction). Doppler parameters are consistent with elevated ventricular end-diastolic filling pressure. - Aortic valve: There was moderate regurgitation. - Mitral valve: Calcified annulus. Mildly thickened leaflets . The findings are consistent with severe stenosis. There was moderate to severe regurgitation directed posteriorly. - Left atrium: The atrium was mildly dilated. - Right atrium: The atrium was normal in size. - Tricuspid valve: There was moderate regurgitation. - Pulmonary arteries: Systolic pressure was severely increased. PA peak pressure: 78 mm Hg (S). - Inferior vena cava: The vessel was dilated. The  respirophasic diameter changes were blunted (<50%), consistent with elevated central venous pressure. - Pericardium, extracardiac: A trivial pericardial effusion was identified posterior to the heart.  Impressions:  - LVEF is preserved. Grade 2 diastolic dysfunction with severely elevated filling pressures. Severe mitral stenosis. MR jet posteriorly directed with systolic blunting of the pulmonary vein forward flow consistent with moderate to severe  mitral regurgitation. Severe pulmonary hypertension with RVSP 78 mmHg, previously 51 mmHg. Aortic regurgitation jet not significant on this study but moderate based on PHT.  Further evaluation of the aortic na dmitral valve by TEE is recommended.  ------------------------------------------------------------------- Study data: Comparison was made to the study of 08/31/2015. Study status: Routine. Procedure: The patient reported no pain pre or post test. Transthoracic echocardiography for left ventricular function evaluation and for assessment of valvular function. Image quality was adequate. Study completion: There were no complications. Transthoracic echocardiography. M-mode, complete 2D, spectral Doppler, and color Doppler. Birthdate: Patient birthdate: [birth year]. Age: Patient is 66 yr old. Sex: Gender: female. BMI: 23.7 kg/m^2. Blood pressure: 156/74 Patient status: Outpatient. Study date: Study date: 12/23/2016. Study time: 07:50 AM. Location: [facility]  -------------------------------------------------------------------  ------------------------------------------------------------------- Left ventricle: There is false tendon in the left ventricle. The cavity size was normal. There was mild focal basal hypertrophy of the septum. Systolic function was normal. The estimated ejection fraction was in the range of 55% to 60%. Wall motion was  normal; there were no regional wall motion abnormalities. Features are consistent with a pseudonormal left ventricular filling pattern, with concomitant abnormal relaxation and increased filling pressure (grade 2 diastolic dysfunction). Doppler parameters are consistent with elevated ventricular end-diastolic filling pressure.  ------------------------------------------------------------------- Aortic valve: Trileaflet; mildly thickened, mildly calcified leaflets. Mobility was not restricted. Doppler: Transvalvular velocity was within the normal range. There was no stenosis. There was moderate regurgitation.  ------------------------------------------------------------------- Aorta: Aortic root: The aortic root was normal in size.  ------------------------------------------------------------------- Mitral valve: Calcified annulus. Mildly thickened leaflets . Mobility was not restricted. Doppler: The findings are consistent with severe stenosis. There was moderate to severe regurgitation directed posteriorly. Valve area by pressure half-time: 3.79 cm^2. Indexed valve area by pressure half-time: 2.14 cm^2/m^2. Valve area by continuity equation (using LVOT flow): 0.82 cm^2. Indexed valve area by continuity equation (using LVOT flow): 0.46 cm^2/m^2. Mean gradient (D): 11 mm Hg. Peak gradient (D): 18 mm Hg.  ------------------------------------------------------------------- Left atrium: The atrium was mildly dilated.  ------------------------------------------------------------------- Right ventricle: The cavity size was normal. Wall thickness was normal. Systolic function was normal.  ------------------------------------------------------------------- Pulmonic valve: Structurally normal valve. Cusp separation was normal. Doppler: Transvalvular velocity was within the normal range. There was no evidence for stenosis. There was  mild regurgitation.  ------------------------------------------------------------------- Tricuspid valve: Structurally normal valve. Doppler: Transvalvular velocity was within the normal range. There was moderate regurgitation.  ------------------------------------------------------------------- Pulmonary artery: The main pulmonary artery was normal-sized. Systolic pressure was severely increased.  ------------------------------------------------------------------- Right atrium: The atrium was normal in size.  ------------------------------------------------------------------- Pericardium: A trivial pericardial effusion was identified posterior to the heart.  ------------------------------------------------------------------- Systemic veins: Inferior vena cava: The vessel was dilated. The respirophasic diameter changes were blunted (< 50%), consistent with elevated central venous pressure.  ------------------------------------------------------------------- Measurements  Left ventricle Value Reference LV ID, ED, PLAX chordal (L) 36.6 mm 43 - 52 LV ID, ES, PLAX chordal 25.2 mm 23 - 38 LV fx shortening, PLAX chordal 31 % >=29 LV PW thickness, ED 9.58 mm --------- IVS/LV PW ratio, ED 1.21 <=1.3 Stroke volume, 2D 39 ml --------- Stroke volume/bsa, 2D 22 ml/m^2 --------- LV ejection fraction, 1-p A4C 55 % --------- LV end-diastolic volume, 2-p 50 ml --------- LV end-systolic volume, 2-p 24 ml --------- LV ejection fraction, 2-p 52 % --------- Stroke volume, 2-p 26 ml --------- LV end-diastolic volume/bsa, 2-p  28 ml/m^2 --------- LV end-systolic volume/bsa, 2-p 14 ml/m^2 --------- Stroke volume/bsa, 2-p 14.7 ml/m^2 --------- LV e&', lateral 6.43  cm/s --------- LV E/e&', lateral 33.28 --------- LV e&', medial 7.51 cm/s --------- LV E/e&', medial 28.5 --------- LV e&', average 6.97 cm/s --------- LV E/e&', average 30.7 ---------  Ventricular septum Value Reference IVS thickness, ED 11.6 mm ---------  LVOT Value Reference LVOT ID, S 17 mm --------- LVOT area 2.27 cm^2 --------- LVOT ID 17 mm --------- LVOT peak velocity, S 76.7 cm/s --------- LVOT mean velocity, S 52.6 cm/s --------- LVOT VTI, S 17 cm --------- LVOT peak gradient, S 2 mm Hg --------- Stroke volume (SV), LVOT DP 38.6 ml --------- Stroke index (SV/bsa), LVOT DP 21.8 ml/m^2 ---------  Aortic valve Value Reference Aortic regurg pressure half-time 232 ms ---------  Aorta Value Reference Aortic root ID, ED 28 mm --------- Ascending aorta ID, A-P, S 24 mm ---------  Left atrium Value Reference LA ID, A-P, ES 33 mm --------- LA ID/bsa, A-P  1.87 cm/m^2 <=2.2 LA volume, S 41 ml --------- LA volume/bsa, S 23.2 ml/m^2 --------- LA volume, ES, 1-p A4C 34 ml --------- LA volume/bsa, ES, 1-p A4C 19.2 ml/m^2 --------- LA volume, ES, 1-p A2C 46 ml --------- LA volume/bsa, ES, 1-p A2C 26 ml/m^2 ---------  Mitral valve Value Reference Mitral E-wave peak velocity 214 cm/s --------- Mitral A-wave peak velocity 111 cm/s --------- Mitral mean velocity, D 153 cm/s --------- Mitral deceleration time (H) 239 ms 150 - 230 Mitral pressure half-time 58 ms --------- Mitral mean gradient, D 11 mm Hg --------- Mitral peak gradient, D 18 mm Hg --------- Mitral E/A ratio, peak 1.3 --------- Mitral valve area, PHT, DP 3.79 cm^2 --------- Mitral valve area/bsa, PHT, DP 2.14 cm^2/m^2 --------- Mitral valve area, LVOT 0.82 cm^2 --------- continuity Mitral valve area/bsa, LVOT 0.46 cm^2/m^2 --------- continuity Mitral annulus VTI, D 47.2 cm ---------  Pulmonary arteries Value Reference PA pressure, S, DP (H) 78 mm Hg <=30  Tricuspid valve Value Reference Tricuspid regurg peak velocity 396 cm/s --------- Tricuspid peak RV-RA gradient 63 mm Hg ---------  Systemic veins Value Reference Estimated CVP 8 mm Hg  ---------  Right ventricle Value Reference RV pressure, S, DP (H) 71 mm Hg <=30 RV s&', lateral, S 10.3 cm/s ---------  Legend: (L) and (H) mark values outside specified reference range.  ------------------------------------------------------------------- Prepared and Electronically Authenticated by  Ena Dawley, M.D. 2018-01-12T09:17:15   Transesophageal Echocardiography  Patient: Grasiela, Jonsson MR #: [id number] Study Date: 06/29/2017 Gender: F Age: 80 Height: 167.6 cm Weight: 59.9 kg BSA: 1.67 m^2 Pt. Status: Room:  ADMITTING Candee Furbish, M.D. ATTENDING Candee Furbish, M.D. ORDERING Candee Furbish, M.D. PERFORMING Candee Furbish, M.D. SONOGRAPHER Lauren Pennington, RDCS, CCT  cc:  ------------------------------------------------------------------- LV EF: 55% - 60%  ------------------------------------------------------------------- Indications: Mitral regurgitation 424.0.  ------------------------------------------------------------------- Study Conclusions  - Left ventricle: The cavity size was normal. Wall thickness was normal. Systolic function was normal. The estimated ejection fraction was in the range of 55% to 60%. Wall motion was normal; there were no regional wall motion abnormalities. - Aortic valve: Mildly thickened, mildly calcified leaflets. No evidence of vegetation. There was mild to moderate regurgitation. Vena contracta 0.27cm2. - Mitral valve: No evidence of vegetation. The findings are consistent with mild to moderate stenosis. MV mean gradient 5-39mmHg, pressure half time 78- valve area 2.8cm2, Planimetry - 2.3cm2 There was moderate regurgitation directed eccentrically. PISA 0.5cm, ERO 0.1cm2, MR volume 18ml, no pulmonary vein reversal. - Left atrium:  No evidence of thrombus in the atrial cavity or appendage. No evidence of thrombus in the appendage. - Atrial septum: Very few bubbles /delayed appearance of  agitated saline contrast in the LA suggestive of possible transpulmonary passage or shunt. No significant PFO or ASD. - Tricuspid valve: No evidence of vegetation. - Pulmonic valve: No evidence of vegetation. - Superior vena cava: The study excluded a thrombus.  ------------------------------------------------------------------- Study data: Study status: Routine. Consent: The risks, benefits, and alternatives to the procedure were explained to the patient and informed consent was obtained. Procedure: Initial setup. The patient was brought to the laboratory. Surface ECG leads were monitored. Sedation. Conscious sedation was administered by cardiology staff. Transesophageal echocardiography. An adult multiplane transesophageal probe was inserted by the attending cardiologistwithout difficulty. Image quality was adequate. Intravenous contrast (agitated saline) was administered. Study completion: The patient tolerated the procedure well. There were no complications. Administered medications: Midazolam, 4mg , IV. Fentanyl, 35mcg, IV. Diagnostic transesophageal echocardiography. 2D and color Doppler. Birthdate: Patient birthdate: Mar 09, 1951. Age: Patient is 66 yr old. Sex: Gender: female. BMI: 21.3 kg/m^2. Blood pressure: 155/68 Patient status: Outpatient. Study date: Study date: 06/29/2017. Study time: 08:11 AM. Location: Endoscopy.  -------------------------------------------------------------------  ------------------------------------------------------------------- Left ventricle: The cavity size was normal. Wall thickness was normal. Systolic function was normal. The estimated ejection fraction was in the range of 55% to 60%. Wall motion was normal; there were no regional wall  motion abnormalities.  ------------------------------------------------------------------- Aortic valve: Mildly thickened, mildly calcified leaflets. Cusp separation was normal. No evidence of vegetation. Doppler: There was mild to moderate regurgitation. Vena contracta 0.27cm2.  ------------------------------------------------------------------- Aorta: The aorta was normal, not dilated, and non-diseased.  ------------------------------------------------------------------- Mitral valve: Structurally normal valve. Leaflet separation was normal. No evidence of vegetation. Doppler: The findings are consistent with mild to moderate stenosis. MV mean gradient 5-23mmHg, pressure half time 78- valve area 2.8cm2, Planimetry - 2.3cm2 There was moderate regurgitation directed eccentrically. PISA 0.5cm, ERO 0.1cm2, MR volume 23ml, no pulmonary vein reversal. Valve area by pressure half-time: 2.72 cm^2. Indexed valve area by pressure half-time: 1.63 cm^2/m^2. Mean gradient (D): 6 mm Hg.  ------------------------------------------------------------------- Left atrium: The atrium was normal in size. No evidence of thrombus in the atrial cavity or appendage. No evidence of thrombus in the appendage. The appendage was of normal size. Emptying velocity was normal.  ------------------------------------------------------------------- Atrial septum: Very few bubbles /delayed appearance of agitated saline contrast in the LA suggestive of possible transpulmonary passage or shunt. No significant PFO or ASD.  ------------------------------------------------------------------- Right ventricle: The cavity size was normal. Wall thickness was normal. Systolic function was normal.  ------------------------------------------------------------------- Pulmonic valve: Structurally normal valve. Cusp separation was normal. No evidence of vegetation. Doppler: There was  trivial regurgitation.  ------------------------------------------------------------------- Tricuspid valve: Structurally normal valve. Leaflet separation was normal. No evidence of vegetation. Doppler: There was mild regurgitation.  ------------------------------------------------------------------- Pulmonary artery: The main pulmonary artery was normal-sized.  ------------------------------------------------------------------- Pericardium: The pericardium was normal in appearance. There was no pericardial effusion.  ------------------------------------------------------------------- Systemic veins: Superior vena cava: The study excluded a thrombus.  ------------------------------------------------------------------- Post procedure conclusions Ascending Aorta:  - The aorta was normal, not dilated, and non-diseased.  ------------------------------------------------------------------- Measurements  Mitral valve Value Mitral mean velocity, D 116 cm/s Mitral pressure half-time 78 ms Mitral mean gradient, D 6 mm Hg Mitral valve area, PHT, DP 2.72 cm^2 Mitral valve area/bsa, PHT, DP 1.63 cm^2/m^2 Mitral annulus VTI, D 46.2 cm Mitral maximal regurg velocity, PISA 565 cm/s Mitral regurg VTI, PISA 181 cm Mitral ERO, PISA 0.1 cm^2 Mitral regurg volume, PISA 18 ml  Legend: (L) and (H) mark values outside specified reference range.  ------------------------------------------------------------------- Prepared and Electronically Authenticated by  Candee Furbish, M.D. 2018-07-19T09:[id number]  Right/Left Heart Cath and Coronary Angiography  Conclusion     There is moderate (3+) mitral regurgitation.  1. No significant coronary  disease.  2. Normal LV systolic function.  3. 3+ mitral regurgitation.  4. Severe mitral stenosis.  5. No more than mild aortic stenosis.  6. Mildly elevated PCWP with near-normal LVEDP.  7. 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  8. Root shot not done to assess aortic insufficiency, will have TEE on Thursday.    Procedural Details/Technique   Technical Details Procedure: Right Heart Cath, Left Heart Cath, Selective Coronary Angiography, LV angiography  Indication: Mitral and aortic valve disease, pre-surgery   Procedural Details: The right groin and right radial area were prepped, draped, and anesthetized with 1% lidocaine. Using the modified Seldinger technique a 7 French sheath was placed in the right femoral vein. A Swan-Ganz catheter was used for the right heart catheterization. Standard protocol was followed for recording of right heart pressures and sampling of oxygen saturations. Fick and thermodilution cardiac output was calculated. The right radial artery was entered using modified Seldinger technique and a 55F sheath was placed. The patient received 3 mg IA verapamil, weight-based IV heparin, and later due to radial vasospasm 200 mcg IA NTG. Standard Judkins catheters were used for selective coronary angiography and left ventriculography. Simultaneous LV and PCWP were measured. There were no immediate procedural complications. The patient was transferred to the post catheterization recovery area for further monitoring.   Estimated blood loss <50 mL.  During this procedure the patient was administered the following to achieve and maintain moderate conscious sedation: Versed 2 mg, Fentanyl 50 mcg, while the patient's heart rate, blood pressure, and oxygen saturation were continuously monitored. The  period of conscious sedation was 53 minutes, of which I was present face-to-face 100% of this time.    Coronary Findings   Dominance: Right  Left Main  Short, no angiographic disease.  Left Anterior Descending  No angiographic CAD.  Left Circumflex  Mild luminal irregularities.  Right Coronary Artery  No angiographic CAD.  Right Heart   Right Heart Pressures RHC Procedural Findings: Hemodynamics (mmHg) RA mean 6 RV 68/8 PA 62/16, mean 37 PCWP mean 18 LV 106/14 AO 94/44  Oxygen saturations: PA 67% AO 99%  Cardiac Output (Fick) 4.15  Cardiac Index (Fick) 2.43 PVR 4.6 WU  Cardiac Output (Thermo) 3.42 Cardiac Index (Thermo) 2  PVR 5.5 WU  Aortic valve peak to peak gradient 12 mmHg  Mitral valve mean gradient 11 mmHg Mitral valve area (continuity equation) 1.07 cm^2    Wall Motion   EF 60-65%, no wall motion abnormalities in the RAO projection.         Left Heart   Mitral Valve There is moderate (3+) mitral regurgitation. 3+ mitral regurgitation.    Implants        No implant documentation for this case.  PACS Images   Show images for Cardiac catheterization   Link to Procedure Log   Procedure Log    Hemo Data    Most Recent Value  Fick Cardiac Output 4.15 L/min  Fick Cardiac Output Index 2.43 (L/min)/BSA  Thermal Cardiac Output 3.42 L/min  Thermal Cardiac Output Index 2 (L/min)/BSA  Mitral Mean Gradient 11.1 mmHg  Mitral Peak Gradient 6 mmHg  Mitral Valve Area Index 0.63 cm2/BSA  RA A Wave 14 mmHg  RA  V Wave 8 mmHg  RA Mean 6 mmHg  RV Systolic Pressure 68 mmHg  RV Diastolic Pressure 2 mmHg  RV EDP 8 mmHg  PA Systolic Pressure 62 mmHg  PA Diastolic Pressure 16 mmHg  PA Mean 37 mmHg  PW A Wave 18 mmHg  PW V Wave 26 mmHg  PW Mean 18 mmHg  AO Systolic Pressure 86 mmHg  AO Diastolic Pressure 44 mmHg  AO Mean 61 mmHg  LV Systolic Pressure 818 mmHg  LV Diastolic Pressure 4 mmHg  LV EDP 15 mmHg  Arterial Occlusion Pressure  Extended Systolic Pressure 94 mmHg  Arterial Occlusion Pressure Extended Diastolic Pressure 44 mmHg  Arterial Occlusion Pressure Extended Mean Pressure 64 mmHg  Left Ventricular Apex Extended Systolic Pressure 299 mmHg  Left Ventricular Apex Extended Diastolic Pressure 3 mmHg  Left Ventricular Apex Extended EDP Pressure 14 mmHg  QP/QS 1  TPVR Index 15.25 HRUI  TSVR Index 24.32 HRUI  PVR SVR Ratio 0.36  TPVR/TSVR Ratio 0.63    Cardiac TAVR CT  TECHNIQUE: The patient was scanned on a Philips 256 scanner. A 120 kV retrospective scan was triggered in the descending thoracic aorta at 111 HU's. Gantry rotation speed was 270 msecs and collimation was .9 mm. No beta blockade or nitro were given. The 3D data set was reconstructed in 5% intervals of the R-R cycle. Systolic and diastolic phases were analyzed on a dedicated work station using MPR, MIP and VRT modes. The patient received 80 cc of contrast.  FINDINGS: Aortic Valve: Trileaflet, mild thickening, no calcifications.  Aorta: Normal size, mild diffuse calcifications. No dissection.  Sinotubular Junction: 23 x 22 mm  Ascending Thoracic Aorta: 23 x 22 mm  Aortic Arch: 20 x 19 mm  Descending Thoracic Aorta: 16 x 16 mm  Sinus of Valsalva Measurements:  Non-coronary: 26 mm  Right -coronary: 26 mm  Left -coronary: 28 mm  Coronary Artery Height above Annulus:  Left Main: 12 mm  Right Coronary: 15 mm  Virtual Basal Annulus Measurements:  Maximum/Minimum Diameter: 24 x 17 mm  Perimeter: 64 mm  Area: 312 mm2  Coronary Arteries: Normal origin, right dominance. Mild non-obstructive plaque.  Normal size of the pulmonary artery.  Normal pulmonary vein drainage into the left atrium.  No left atrial appendage thrombus.  Mitral annular calcifications.  IMPRESSION: 1.Trileaflet aortic valve with mild thickening and no calcifications.  2. Thoracic aorta has normal size with  mild diffuse calcifications and dissection.  3. No left atrial appendage thrombus.  4. Mitral annular calcifications.  Ena Dawley   Electronically Signed By: Ena Dawley On: 08/11/2017 18:24   Impression:  Patient has stage D severe symptomatic primary mitral regurgitation with mild to moderate aortic insufficiency. She presents with long-standing history of worsening symptoms of exertional shortness of breath, orthopnea, and PND consistent with chronic diastolic congestive heart failure, [place] Heart Association function class III.  I have personally reviewed the patient's recent transesophageal echocardiogram and diagnostic cardiac catheterization. TEE reveals what appears to be primarily moderate restrictive disease involving the mitral valve (functional type IIIa) which I suspect is related to the patient's history of radiation therapy to the chest in the distant past. There is a broad central jet of mitral regurgitation which fills the left atrium. There may be some functional component related to annular dilatation which could wax and wane with degrees of fluid overload. There is only mild mitral stenosis. 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 The aortic  insufficiency is mild (1+) and perhaps moderate (2+)at most. I agree that mitral valve repair or replacement is indicated, and I suspect that her valve may need to be replaced. Ring annuloplasty of the aortic valve could be contemplated at the time of surgery, but her aortic annulus is quite small and this might create significant aortic stenosis. I favor leaving the aortic valve alone at this time.    Plan:  The patient was again counseled at length regarding the indications, risks and potential benefits of mitral valve repair or replacement.  The rationale for elective surgery has been explained, including a comparison between surgery and continued medical  therapy with close follow-up.  The likelihood of successful and durable valve repair has been discussed with particular reference to the findings of their recent echocardiogram.  Based upon these findings and previous experience, I have quoted her a less than 50 percent likelihood of successful valve repair.  Assuming that her valve cannot be successfully repaired, we discussed the possibility of replacing the mitral valve using a mechanical prosthesis with the attendant need for long-term anticoagulation versus the alternative of replacing it using a bioprosthetic tissue valve with its potential for late structural valve deterioration and failure, depending upon the patient's longevity.  The patient specifically requests that if the mitral valve must be replaced that it be done using a bioprosthetic valve.   The patient understands and accepts all potential risks of surgery including but not limited to risk of death, stroke or other neurologic complication, myocardial infarction, congestive heart failure, respiratory failure, renal failure, bleeding requiring transfusion and/or reexploration, arrhythmia, infection or other wound complications, pneumonia, pleural and/or pericardial effusion, pulmonary embolus, aortic dissection or other major vascular complication, or delayed complications related to valve repair or replacement including but not limited to structural valve deterioration and failure, thrombosis, embolization, endocarditis, or paravalvular leak.  Alternative surgical approaches have been discussed including a comparison between conventional sternotomy and minimally-invasive techniques.  The relative risks and benefits of each have been reviewed as they pertain to the patient's specific circumstances, and all of their questions have been addressed.  Specific risks potentially related to the minimally-invasive approach were discussed at length, including but not limited to risk of conversion to full or  partial sternotomy, aortic dissection or other major vascular complication, unilateral acute lung injury or pulmonary edema, phrenic nerve dysfunction or paralysis, rib fracture, chronic pain, lung hernia, or lymphocele.  All of her questions have been answered.  Patient desires to proceed with surgery as soon as practical. We tented we plan to proceed with minimally invasive mitral valve repair or replacement on Wednesday, 09/13/2017. I have given the patient a prescription for amiodarone to begin 1 week prior to surgery to decrease her risk of perioperative atrial dysrhythmias.     Valentina Gu. Roxy Manns, MD 09/04/2017 3:54 PM

## 2017-09-12 LAB — VAS US DOPPLER PRE CABG
LEFT ECA DIAS: -10 cm/s
LEFT VERTEBRAL DIAS: -10 cm/s
Left CCA dist dias: -20 cm/s
Left CCA dist sys: -143 cm/s
Left CCA prox dias: 10 cm/s
Left CCA prox sys: 111 cm/s
Left ICA dist dias: -23 cm/s
Left ICA dist sys: -115 cm/s
Left ICA prox dias: -20 cm/s
Left ICA prox sys: -116 cm/s
RIGHT ECA DIAS: -5 cm/s
RIGHT VERTEBRAL DIAS: -17 cm/s
Right CCA prox dias: 13 cm/s
Right CCA prox sys: 104 cm/s
Right cca dist sys: -118 cm/s

## 2017-09-12 MED ORDER — TRANEXAMIC ACID 1000 MG/10ML IV SOLN
1.5000 mg/kg/h | INTRAVENOUS | Status: AC
  Administered 2017-09-13: 1.5 mg/kg/h via INTRAVENOUS
  Filled 2017-09-12: qty 25

## 2017-09-12 MED ORDER — PHENYLEPHRINE HCL 10 MG/ML IJ SOLN
30.0000 ug/min | INTRAMUSCULAR | Status: AC
  Administered 2017-09-13: 20 ug/min via INTRAVENOUS
  Filled 2017-09-12: qty 2

## 2017-09-12 MED ORDER — DOPAMINE-DEXTROSE 3.2-5 MG/ML-% IV SOLN
0.0000 ug/kg/min | INTRAVENOUS | Status: DC
  Filled 2017-09-12: qty 250

## 2017-09-12 MED ORDER — VANCOMYCIN HCL 1000 MG IV SOLR
INTRAVENOUS | Status: AC
  Administered 2017-09-13: 1000 mL
  Filled 2017-09-12: qty 1000

## 2017-09-12 MED ORDER — SODIUM CHLORIDE 0.9 % IV SOLN
INTRAVENOUS | Status: AC
  Administered 2017-09-13: 1.4 [IU]/h via INTRAVENOUS
  Filled 2017-09-12: qty 1

## 2017-09-12 MED ORDER — NITROGLYCERIN IN D5W 200-5 MCG/ML-% IV SOLN
2.0000 ug/min | INTRAVENOUS | Status: AC
  Administered 2017-09-13: 5 ug/min via INTRAVENOUS
  Filled 2017-09-12: qty 250

## 2017-09-12 MED ORDER — MAGNESIUM SULFATE 50 % IJ SOLN
40.0000 meq | INTRAMUSCULAR | Status: DC
  Filled 2017-09-12: qty 10

## 2017-09-12 MED ORDER — TRANEXAMIC ACID (OHS) BOLUS VIA INFUSION
15.0000 mg/kg | INTRAVENOUS | Status: AC
  Administered 2017-09-13: 895.5 mg via INTRAVENOUS
  Filled 2017-09-12: qty 896

## 2017-09-12 MED ORDER — EPINEPHRINE PF 1 MG/ML IJ SOLN
0.0000 ug/min | INTRAVENOUS | Status: DC
  Filled 2017-09-12: qty 4

## 2017-09-12 MED ORDER — KENNESTONE BLOOD CARDIOPLEGIA VIAL
13.0000 mL | Freq: Once | Status: DC
  Filled 2017-09-12: qty 13

## 2017-09-12 MED ORDER — POTASSIUM CHLORIDE 2 MEQ/ML IV SOLN
80.0000 meq | INTRAVENOUS | Status: DC
  Filled 2017-09-12: qty 40

## 2017-09-12 MED ORDER — HEPARIN SODIUM (PORCINE) 1000 UNIT/ML IJ SOLN
INTRAMUSCULAR | Status: DC
  Filled 2017-09-12 (×2): qty 30

## 2017-09-12 MED ORDER — LEVOFLOXACIN IN D5W 500 MG/100ML IV SOLN
500.0000 mg | INTRAVENOUS | Status: AC
  Administered 2017-09-13: 500 mg via INTRAVENOUS
  Filled 2017-09-12: qty 100

## 2017-09-12 MED ORDER — SODIUM CHLORIDE 0.9 % IV SOLN
1250.0000 mg | INTRAVENOUS | Status: AC
  Administered 2017-09-13: 1250 mg via INTRAVENOUS
  Filled 2017-09-12: qty 1250

## 2017-09-12 MED ORDER — KENNESTONE BLOOD CARDIOPLEGIA (KBC) MANNITOL SYRINGE (20%, 32ML)
32.0000 mL | Freq: Once | INTRAVENOUS | Status: DC
  Filled 2017-09-12: qty 32

## 2017-09-12 MED ORDER — DEXMEDETOMIDINE HCL IN NACL 400 MCG/100ML IV SOLN
0.1000 ug/kg/h | INTRAVENOUS | Status: AC
  Administered 2017-09-13: .4 ug/kg/h via INTRAVENOUS
  Filled 2017-09-12: qty 100

## 2017-09-12 MED ORDER — CHLORHEXIDINE GLUCONATE 0.12 % MT SOLN
15.0000 mL | Freq: Once | OROMUCOSAL | Status: AC
  Administered 2017-09-13: 15 mL via OROMUCOSAL
  Filled 2017-09-12: qty 15

## 2017-09-12 MED ORDER — KENNESTONE BLOOD CARDIOPLEGIA (KBC) MANNITOL SYRINGE (20%, 32ML)
32.0000 mL | Freq: Once | INTRAVENOUS | Status: DC
  Filled 2017-09-12 (×2): qty 32

## 2017-09-12 MED ORDER — PLASMA-LYTE 148 IV SOLN
INTRAVENOUS | Status: AC
  Administered 2017-09-13: 500 mL
  Filled 2017-09-12: qty 2.5

## 2017-09-12 MED ORDER — TRANEXAMIC ACID (OHS) PUMP PRIME SOLUTION
2.0000 mg/kg | INTRAVENOUS | Status: DC
  Filled 2017-09-12: qty 1.19

## 2017-09-12 MED ORDER — GLUTARALDEHYDE 0.625% SOAKING SOLUTION
TOPICAL | Status: DC
  Filled 2017-09-12 (×2): qty 50

## 2017-09-12 MED ORDER — METOPROLOL TARTRATE 12.5 MG HALF TABLET: 12.5000 mg | ORAL_TABLET | Freq: Once | ORAL | Status: DC

## 2017-09-12 MED ORDER — KENNESTONE BLOOD CARDIOPLEGIA VIAL
13.0000 mL | Freq: Once | Status: DC
  Filled 2017-09-12 (×2): qty 13

## 2017-09-13 ENCOUNTER — Encounter (HOSPITAL_COMMUNITY): Payer: Self-pay | Admitting: *Deleted

## 2017-09-13 ENCOUNTER — Inpatient Hospital Stay (HOSPITAL_COMMUNITY)
Admission: RE | Admit: 2017-09-13 | Discharge: 2017-09-21 | DRG: 220 | Disposition: A | Discharge status: Home / Self Care | Payer: Medicare Other | Source: Ambulatory Visit | Attending: Thoracic Surgery (Cardiothoracic Vascular Surgery) | Admitting: Thoracic Surgery (Cardiothoracic Vascular Surgery)

## 2017-09-13 ENCOUNTER — Inpatient Hospital Stay (HOSPITAL_COMMUNITY): Payer: Medicare Other | Admitting: Anesthesiology

## 2017-09-13 ENCOUNTER — Inpatient Hospital Stay (HOSPITAL_COMMUNITY): Payer: Medicare Other

## 2017-09-13 ENCOUNTER — Encounter (HOSPITAL_COMMUNITY)
Admission: RE | Disposition: A | Discharge status: Home / Self Care | Payer: Self-pay | Source: Ambulatory Visit | Attending: Thoracic Surgery (Cardiothoracic Vascular Surgery)

## 2017-09-13 DIAGNOSIS — Z953 Presence of xenogenic heart valve: Secondary | ICD-10-CM

## 2017-09-13 DIAGNOSIS — Z923 Personal history of irradiation: Secondary | ICD-10-CM

## 2017-09-13 DIAGNOSIS — Z23 Encounter for immunization: Secondary | ICD-10-CM

## 2017-09-13 DIAGNOSIS — J449 Chronic obstructive pulmonary disease, unspecified: Secondary | ICD-10-CM | POA: Diagnosis present

## 2017-09-13 DIAGNOSIS — I340 Nonrheumatic mitral (valve) insufficiency: Secondary | ICD-10-CM

## 2017-09-13 DIAGNOSIS — J90 Pleural effusion, not elsewhere classified: Secondary | ICD-10-CM

## 2017-09-13 DIAGNOSIS — D62 Acute posthemorrhagic anemia: Secondary | ICD-10-CM | POA: Diagnosis present

## 2017-09-13 DIAGNOSIS — Z9889 Other specified postprocedural states: Secondary | ICD-10-CM

## 2017-09-13 DIAGNOSIS — Z853 Personal history of malignant neoplasm of breast: Secondary | ICD-10-CM

## 2017-09-13 DIAGNOSIS — Z9221 Personal history of antineoplastic chemotherapy: Secondary | ICD-10-CM | POA: Diagnosis not present

## 2017-09-13 DIAGNOSIS — Z8711 Personal history of peptic ulcer disease: Secondary | ICD-10-CM

## 2017-09-13 DIAGNOSIS — K219 Gastro-esophageal reflux disease without esophagitis: Secondary | ICD-10-CM | POA: Diagnosis present

## 2017-09-13 DIAGNOSIS — Z7982 Long term (current) use of aspirin: Secondary | ICD-10-CM | POA: Diagnosis not present

## 2017-09-13 DIAGNOSIS — R7303 Prediabetes: Secondary | ICD-10-CM | POA: Diagnosis present

## 2017-09-13 DIAGNOSIS — Z79899 Other long term (current) drug therapy: Secondary | ICD-10-CM

## 2017-09-13 DIAGNOSIS — I351 Nonrheumatic aortic (valve) insufficiency: Secondary | ICD-10-CM | POA: Diagnosis present

## 2017-09-13 DIAGNOSIS — I5032 Chronic diastolic (congestive) heart failure: Secondary | ICD-10-CM | POA: Diagnosis present

## 2017-09-13 DIAGNOSIS — J9811 Atelectasis: Secondary | ICD-10-CM | POA: Diagnosis not present

## 2017-09-13 DIAGNOSIS — I110 Hypertensive heart disease with heart failure: Secondary | ICD-10-CM | POA: Diagnosis present

## 2017-09-13 DIAGNOSIS — I10 Essential (primary) hypertension: Secondary | ICD-10-CM | POA: Diagnosis present

## 2017-09-13 DIAGNOSIS — I272 Pulmonary hypertension, unspecified: Secondary | ICD-10-CM | POA: Diagnosis present

## 2017-09-13 DIAGNOSIS — I442 Atrioventricular block, complete: Secondary | ICD-10-CM | POA: Diagnosis not present

## 2017-09-13 DIAGNOSIS — Z8572 Personal history of non-Hodgkin lymphomas: Secondary | ICD-10-CM

## 2017-09-13 DIAGNOSIS — M810 Age-related osteoporosis without current pathological fracture: Secondary | ICD-10-CM | POA: Diagnosis present

## 2017-09-13 DIAGNOSIS — I050 Rheumatic mitral stenosis: Secondary | ICD-10-CM | POA: Diagnosis present

## 2017-09-13 DIAGNOSIS — Z950 Presence of cardiac pacemaker: Secondary | ICD-10-CM

## 2017-09-13 DIAGNOSIS — I083 Combined rheumatic disorders of mitral, aortic and tricuspid valves: Principal | ICD-10-CM | POA: Diagnosis present

## 2017-09-13 DIAGNOSIS — Z959 Presence of cardiac and vascular implant and graft, unspecified: Secondary | ICD-10-CM

## 2017-09-13 HISTORY — DX: Presence of xenogenic heart valve: Z95.3

## 2017-09-13 HISTORY — PX: MITRAL VALVE REPAIR: SHX2039

## 2017-09-13 HISTORY — PX: TEE WITHOUT CARDIOVERSION: SHX5443

## 2017-09-13 LAB — PREPARE RBC (CROSSMATCH)

## 2017-09-13 LAB — POCT I-STAT, CHEM 8
BUN: 20 mg/dL (ref 6–20)
BUN: 18 mg/dL (ref 6–20)
BUN: 15 mg/dL (ref 6–20)
BUN: 15 mg/dL (ref 6–20)
BUN: 17 mg/dL (ref 6–20)
BUN: 16 mg/dL (ref 6–20)
BUN: 15 mg/dL (ref 6–20)
BUN: 14 mg/dL (ref 6–20)
BUN: 15 mg/dL (ref 6–20)
Calcium, Ion: 1.21 mmol/L (ref 1.15–1.40)
Calcium, Ion: 1.16 mmol/L (ref 1.15–1.40)
Calcium, Ion: 0.9 mmol/L — ABNORMAL LOW (ref 1.15–1.40)
Calcium, Ion: 0.98 mmol/L — ABNORMAL LOW (ref 1.15–1.40)
Calcium, Ion: 1.01 mmol/L — ABNORMAL LOW (ref 1.15–1.40)
Calcium, Ion: 0.92 mmol/L — ABNORMAL LOW (ref 1.15–1.40)
Calcium, Ion: 0.95 mmol/L — ABNORMAL LOW (ref 1.15–1.40)
Calcium, Ion: 0.96 mmol/L — ABNORMAL LOW (ref 1.15–1.40)
Calcium, Ion: 1.23 mmol/L (ref 1.15–1.40)
Chloride: 97 mmol/L — ABNORMAL LOW (ref 101–111)
Chloride: 98 mmol/L — ABNORMAL LOW (ref 101–111)
Chloride: 95 mmol/L — ABNORMAL LOW (ref 101–111)
Chloride: 98 mmol/L — ABNORMAL LOW (ref 101–111)
Chloride: 99 mmol/L — ABNORMAL LOW (ref 101–111)
Chloride: 103 mmol/L (ref 101–111)
Chloride: 98 mmol/L — ABNORMAL LOW (ref 101–111)
Chloride: 100 mmol/L — ABNORMAL LOW (ref 101–111)
Chloride: 103 mmol/L (ref 101–111)
Creatinine, Ser: 0.9 mg/dL (ref 0.44–1.00)
Creatinine, Ser: 0.9 mg/dL (ref 0.44–1.00)
Creatinine, Ser: 0.5 mg/dL (ref 0.44–1.00)
Creatinine, Ser: 0.6 mg/dL (ref 0.44–1.00)
Creatinine, Ser: 0.7 mg/dL (ref 0.44–1.00)
Creatinine, Ser: 0.7 mg/dL (ref 0.44–1.00)
Creatinine, Ser: 0.7 mg/dL (ref 0.44–1.00)
Creatinine, Ser: 0.7 mg/dL (ref 0.44–1.00)
Creatinine, Ser: 0.7 mg/dL (ref 0.44–1.00)
Glucose, Bld: 129 mg/dL — ABNORMAL HIGH (ref 65–99)
Glucose, Bld: 124 mg/dL — ABNORMAL HIGH (ref 65–99)
Glucose, Bld: 103 mg/dL — ABNORMAL HIGH (ref 65–99)
Glucose, Bld: 125 mg/dL — ABNORMAL HIGH (ref 65–99)
Glucose, Bld: 135 mg/dL — ABNORMAL HIGH (ref 65–99)
Glucose, Bld: 167 mg/dL — ABNORMAL HIGH (ref 65–99)
Glucose, Bld: 155 mg/dL — ABNORMAL HIGH (ref 65–99)
Glucose, Bld: 121 mg/dL — ABNORMAL HIGH (ref 65–99)
Glucose, Bld: 123 mg/dL — ABNORMAL HIGH (ref 65–99)
HCT: 33 % — ABNORMAL LOW (ref 36.0–46.0)
HCT: 31 % — ABNORMAL LOW (ref 36.0–46.0)
HCT: 22 % — ABNORMAL LOW (ref 36.0–46.0)
HCT: 21 % — ABNORMAL LOW (ref 36.0–46.0)
HCT: 22 % — ABNORMAL LOW (ref 36.0–46.0)
HCT: 33 % — ABNORMAL LOW (ref 36.0–46.0)
HCT: 25 % — ABNORMAL LOW (ref 36.0–46.0)
HCT: 24 % — ABNORMAL LOW (ref 36.0–46.0)
HCT: 27 % — ABNORMAL LOW (ref 36.0–46.0)
Hemoglobin: 11.2 g/dL — ABNORMAL LOW (ref 12.0–15.0)
Hemoglobin: 10.5 g/dL — ABNORMAL LOW (ref 12.0–15.0)
Hemoglobin: 7.5 g/dL — ABNORMAL LOW (ref 12.0–15.0)
Hemoglobin: 7.1 g/dL — ABNORMAL LOW (ref 12.0–15.0)
Hemoglobin: 7.5 g/dL — ABNORMAL LOW (ref 12.0–15.0)
Hemoglobin: 11.2 g/dL — ABNORMAL LOW (ref 12.0–15.0)
Hemoglobin: 8.5 g/dL — ABNORMAL LOW (ref 12.0–15.0)
Hemoglobin: 8.2 g/dL — ABNORMAL LOW (ref 12.0–15.0)
Hemoglobin: 9.2 g/dL — ABNORMAL LOW (ref 12.0–15.0)
Potassium: 3.2 mmol/L — ABNORMAL LOW (ref 3.5–5.1)
Potassium: 3 mmol/L — ABNORMAL LOW (ref 3.5–5.1)
Potassium: 3.2 mmol/L — ABNORMAL LOW (ref 3.5–5.1)
Potassium: 3.8 mmol/L (ref 3.5–5.1)
Potassium: 4.1 mmol/L (ref 3.5–5.1)
Potassium: 5 mmol/L (ref 3.5–5.1)
Potassium: 4.4 mmol/L (ref 3.5–5.1)
Potassium: 3.9 mmol/L (ref 3.5–5.1)
Potassium: 4.7 mmol/L (ref 3.5–5.1)
Sodium: 139 mmol/L (ref 135–145)
Sodium: 137 mmol/L (ref 135–145)
Sodium: 139 mmol/L (ref 135–145)
Sodium: 136 mmol/L (ref 135–145)
Sodium: 137 mmol/L (ref 135–145)
Sodium: 137 mmol/L (ref 135–145)
Sodium: 138 mmol/L (ref 135–145)
Sodium: 138 mmol/L (ref 135–145)
Sodium: 139 mmol/L (ref 135–145)
TCO2: 31 mmol/L (ref 22–32)
TCO2: 25 mmol/L (ref 22–32)
TCO2: 29 mmol/L (ref 22–32)
TCO2: 27 mmol/L (ref 22–32)
TCO2: 29 mmol/L (ref 22–32)
TCO2: 28 mmol/L (ref 22–32)
TCO2: 29 mmol/L (ref 22–32)
TCO2: 25 mmol/L (ref 22–32)
TCO2: 28 mmol/L (ref 22–32)

## 2017-09-13 LAB — CBC
HCT: 33.8 % — ABNORMAL LOW (ref 36.0–46.0)
HCT: 31.4 % — ABNORMAL LOW (ref 36.0–46.0)
Hemoglobin: 11.4 g/dL — ABNORMAL LOW (ref 12.0–15.0)
Hemoglobin: 11.2 g/dL — ABNORMAL LOW (ref 12.0–15.0)
MCH: 29.2 pg (ref 26.0–34.0)
MCH: 30.4 pg (ref 26.0–34.0)
MCHC: 33.7 g/dL (ref 30.0–36.0)
MCHC: 35.7 g/dL (ref 30.0–36.0)
MCV: 86.4 fL (ref 78.0–100.0)
MCV: 85.3 fL (ref 78.0–100.0)
Platelets: 137 10*3/uL — ABNORMAL LOW (ref 150–400)
Platelets: 151 10*3/uL (ref 150–400)
RBC: 3.91 MIL/uL (ref 3.87–5.11)
RBC: 3.68 MIL/uL — ABNORMAL LOW (ref 3.87–5.11)
RDW: 14.2 % (ref 11.5–15.5)
RDW: 14.8 % (ref 11.5–15.5)
WBC: 19.7 10*3/uL — ABNORMAL HIGH (ref 4.0–10.5)
WBC: 19.3 10*3/uL — ABNORMAL HIGH (ref 4.0–10.5)

## 2017-09-13 LAB — GLUCOSE, CAPILLARY
Glucose-Capillary: 126 mg/dL — ABNORMAL HIGH (ref 65–99)
Glucose-Capillary: 141 mg/dL — ABNORMAL HIGH (ref 65–99)
Glucose-Capillary: 119 mg/dL — ABNORMAL HIGH (ref 65–99)
Glucose-Capillary: 115 mg/dL — ABNORMAL HIGH (ref 65–99)
Glucose-Capillary: 124 mg/dL — ABNORMAL HIGH (ref 65–99)

## 2017-09-13 LAB — POCT I-STAT 3, ART BLOOD GAS (G3+)
Acid-Base Excess: 4 mmol/L — ABNORMAL HIGH (ref 0.0–2.0)
Acid-Base Excess: 1 mmol/L (ref 0.0–2.0)
Bicarbonate: 27.5 mmol/L (ref 20.0–28.0)
Bicarbonate: 24.6 mmol/L (ref 20.0–28.0)
O2 Saturation: 100 %
O2 Saturation: 100 %
TCO2: 29 mmol/L (ref 22–32)
TCO2: 26 mmol/L (ref 22–32)
pCO2 arterial: 35.3 mmHg (ref 32.0–48.0)
pCO2 arterial: 35.2 mmHg (ref 32.0–48.0)
pH, Arterial: 7.499 — ABNORMAL HIGH (ref 7.350–7.450)
pH, Arterial: 7.453 — ABNORMAL HIGH (ref 7.350–7.450)
pO2, Arterial: 400 mmHg — ABNORMAL HIGH (ref 83.0–108.0)
pO2, Arterial: 474 mmHg — ABNORMAL HIGH (ref 83.0–108.0)

## 2017-09-13 LAB — POCT I-STAT 3, VENOUS BLOOD GAS (G3P V)
Acid-base deficit: 1 mmol/L (ref 0.0–2.0)
Bicarbonate: 24.1 mmol/L (ref 20.0–28.0)
O2 Saturation: 52 %
TCO2: 25 mmol/L (ref 22–32)
pCO2, Ven: 40.7 mmHg — ABNORMAL LOW (ref 44.0–60.0)
pH, Ven: 7.381 (ref 7.250–7.430)
pO2, Ven: 28 mmHg — CL (ref 32.0–45.0)

## 2017-09-13 LAB — PLATELET COUNT: Platelets: 277 10*3/uL (ref 150–400)

## 2017-09-13 LAB — APTT: aPTT: 41 seconds — ABNORMAL HIGH (ref 24–36)

## 2017-09-13 LAB — PROTIME-INR
INR: 1.53
Prothrombin Time: 18.3 seconds — ABNORMAL HIGH (ref 11.4–15.2)

## 2017-09-13 SURGERY — REPAIR, MITRAL VALVE, MINIMALLY INVASIVE
Anesthesia: General | Site: Chest | Laterality: Right

## 2017-09-13 MED ORDER — ROCURONIUM BROMIDE 10 MG/ML (PF) SYRINGE
PREFILLED_SYRINGE | INTRAVENOUS | Status: AC
  Filled 2017-09-13: qty 5

## 2017-09-13 MED ORDER — ROCURONIUM BROMIDE 10 MG/ML (PF) SYRINGE
PREFILLED_SYRINGE | INTRAVENOUS | Status: DC | PRN
  Administered 2017-09-13: 30 mg via INTRAVENOUS
  Administered 2017-09-13: 20 mg via INTRAVENOUS
  Administered 2017-09-13 (×2): 50 mg via INTRAVENOUS

## 2017-09-13 MED ORDER — LATANOPROST 0.005 % OP SOLN
1.0000 [drp] | Freq: Every day | OPHTHALMIC | Status: DC
  Administered 2017-09-13 – 2017-09-20 (×8): 1 [drp] via OPHTHALMIC
  Filled 2017-09-13: qty 2.5

## 2017-09-13 MED ORDER — ACETAMINOPHEN 500 MG PO TABS
1000.0000 mg | ORAL_TABLET | Freq: Four times a day (QID) | ORAL | Status: AC
  Administered 2017-09-14 – 2017-09-18 (×13): 1000 mg via ORAL
  Filled 2017-09-13 (×16): qty 2

## 2017-09-13 MED ORDER — METOPROLOL TARTRATE 25 MG/10 ML ORAL SUSPENSION: 12.5000 mg | Freq: Two times a day (BID) | ORAL | Status: DC

## 2017-09-13 MED ORDER — LACTATED RINGERS IV SOLN: 500.0000 mL | Freq: Once | INTRAVENOUS | Status: DC | PRN

## 2017-09-13 MED ORDER — 0.9 % SODIUM CHLORIDE (POUR BTL) OPTIME
TOPICAL | Status: DC | PRN
  Administered 2017-09-13: 1000 mL

## 2017-09-13 MED ORDER — ACETAMINOPHEN 160 MG/5ML PO SOLN: 1000.0000 mg | Freq: Four times a day (QID) | ORAL | Status: DC

## 2017-09-13 MED ORDER — MORPHINE SULFATE (PF) 4 MG/ML IV SOLN
1.0000 mg | INTRAVENOUS | Status: DC | PRN
  Administered 2017-09-14 (×2): 2 mg via INTRAVENOUS
  Filled 2017-09-13 (×2): qty 1

## 2017-09-13 MED ORDER — ACETAMINOPHEN 160 MG/5ML PO SOLN
650.0000 mg | Freq: Once | ORAL | Status: AC
Start: 2017-09-13 — End: 2017-09-13

## 2017-09-13 MED ORDER — LACTATED RINGERS IV SOLN
INTRAVENOUS | Status: DC | PRN
Start: 2017-09-13 — End: 2017-09-13
  Administered 2017-09-13 (×3): via INTRAVENOUS

## 2017-09-13 MED ORDER — ALBUMIN HUMAN 5 % IV SOLN
INTRAVENOUS | Status: DC | PRN
  Administered 2017-09-13: 15:00:00 via INTRAVENOUS

## 2017-09-13 MED ORDER — POTASSIUM CHLORIDE 10 MEQ/50ML IV SOLN
10.0000 meq | INTRAVENOUS | Status: AC
  Administered 2017-09-13 (×3): 10 meq via INTRAVENOUS

## 2017-09-13 MED ORDER — BISACODYL 10 MG RE SUPP
10.0000 mg | Freq: Every day | RECTAL | Status: DC
  Filled 2017-09-13: qty 1

## 2017-09-13 MED ORDER — ARTIFICIAL TEARS OPHTHALMIC OINT
TOPICAL_OINTMENT | OPHTHALMIC | Status: DC | PRN
  Administered 2017-09-13: 1 via OPHTHALMIC

## 2017-09-13 MED ORDER — PROTAMINE SULFATE 10 MG/ML IV SOLN
INTRAVENOUS | Status: AC
  Filled 2017-09-13: qty 25

## 2017-09-13 MED ORDER — DEXMEDETOMIDINE HCL 200 MCG/2ML IV SOLN
0.0000 ug/kg/h | INTRAVENOUS | Status: DC
  Filled 2017-09-13: qty 2

## 2017-09-13 MED ORDER — DOPAMINE-DEXTROSE 3.2-5 MG/ML-% IV SOLN
0.0000 ug/kg/min | INTRAVENOUS | Status: DC
  Administered 2017-09-13: 3 ug/kg/min via INTRAVENOUS

## 2017-09-13 MED ORDER — PANTOPRAZOLE SODIUM 40 MG PO TBEC
40.0000 mg | DELAYED_RELEASE_TABLET | Freq: Every day | ORAL | Status: DC
  Administered 2017-09-15 – 2017-09-21 (×6): 40 mg via ORAL
  Filled 2017-09-13 (×7): qty 1

## 2017-09-13 MED ORDER — SODIUM CHLORIDE 0.9% FLUSH: 3.0000 mL | INTRAVENOUS | Status: DC | PRN

## 2017-09-13 MED ORDER — SODIUM CHLORIDE 0.9 % IV SOLN: 250.0000 mL | INTRAVENOUS | Status: DC

## 2017-09-13 MED ORDER — CHLORHEXIDINE GLUCONATE 0.12 % MT SOLN
15.0000 mL | OROMUCOSAL | Status: AC
  Administered 2017-09-13: 15 mL via OROMUCOSAL

## 2017-09-13 MED ORDER — BUPIVACAINE HCL (PF) 0.5 % IJ SOLN
INTRAMUSCULAR | Status: DC | PRN
  Administered 2017-09-13: 3 mL

## 2017-09-13 MED ORDER — VANCOMYCIN HCL IN DEXTROSE 1-5 GM/200ML-% IV SOLN
1000.0000 mg | Freq: Once | INTRAVENOUS | Status: AC
  Administered 2017-09-15: 1000 mg via INTRAVENOUS
  Filled 2017-09-13: qty 200

## 2017-09-13 MED ORDER — PHENYLEPHRINE 40 MCG/ML (10ML) SYRINGE FOR IV PUSH (FOR BLOOD PRESSURE SUPPORT)
PREFILLED_SYRINGE | INTRAVENOUS | Status: AC
  Filled 2017-09-13: qty 10

## 2017-09-13 MED ORDER — METOPROLOL TARTRATE 5 MG/5ML IV SOLN: 2.5000 mg | INTRAVENOUS | Status: DC | PRN

## 2017-09-13 MED ORDER — TRAMADOL HCL 50 MG PO TABS
50.0000 mg | ORAL_TABLET | ORAL | Status: DC | PRN
  Administered 2017-09-15 – 2017-09-17 (×3): 50 mg via ORAL
  Administered 2017-09-19 – 2017-09-20 (×3): 100 mg via ORAL
  Filled 2017-09-13: qty 1
  Filled 2017-09-13: qty 2
  Filled 2017-09-13: qty 1
  Filled 2017-09-13: qty 2
  Filled 2017-09-13: qty 1
  Filled 2017-09-13: qty 2
  Filled 2017-09-13: qty 1

## 2017-09-13 MED ORDER — ONDANSETRON HCL 4 MG/2ML IJ SOLN
4.0000 mg | Freq: Four times a day (QID) | INTRAMUSCULAR | Status: DC | PRN
  Administered 2017-09-15 (×2): 4 mg via INTRAVENOUS
  Filled 2017-09-13 (×2): qty 2

## 2017-09-13 MED ORDER — HEPARIN SODIUM (PORCINE) 1000 UNIT/ML IJ SOLN
INTRAMUSCULAR | Status: AC
  Filled 2017-09-13: qty 1

## 2017-09-13 MED ORDER — SODIUM CHLORIDE 0.9 % IR SOLN
Status: DC | PRN
  Administered 2017-09-13: 3000 mL

## 2017-09-13 MED ORDER — BUPIVACAINE HCL (PF) 0.5 % IJ SOLN
INTRAMUSCULAR | Status: AC
  Filled 2017-09-13: qty 30

## 2017-09-13 MED ORDER — HEPARIN SODIUM (PORCINE) 1000 UNIT/ML IJ SOLN
INTRAMUSCULAR | Status: DC | PRN
  Administered 2017-09-13: 18000 [IU] via INTRAVENOUS
  Administered 2017-09-13: 5000 [IU] via INTRAVENOUS

## 2017-09-13 MED ORDER — ACETAMINOPHEN 650 MG RE SUPP
650.0000 mg | Freq: Once | RECTAL | Status: AC
  Administered 2017-09-13: 650 mg via RECTAL

## 2017-09-13 MED ORDER — PROPOFOL 10 MG/ML IV BOLUS
INTRAVENOUS | Status: AC
  Filled 2017-09-13: qty 20

## 2017-09-13 MED ORDER — LIDOCAINE 2% (20 MG/ML) 5 ML SYRINGE
INTRAMUSCULAR | Status: AC
  Filled 2017-09-13: qty 5

## 2017-09-13 MED ORDER — DOCUSATE SODIUM 100 MG PO CAPS
200.0000 mg | ORAL_CAPSULE | Freq: Every day | ORAL | Status: DC
  Administered 2017-09-14 – 2017-09-21 (×5): 200 mg via ORAL
  Filled 2017-09-13 (×7): qty 2

## 2017-09-13 MED ORDER — MORPHINE SULFATE (PF) 2 MG/ML IV SOLN: 1.0000 mg | INTRAVENOUS | Status: DC | PRN

## 2017-09-13 MED ORDER — MILRINONE LACTATE IN DEXTROSE 20-5 MG/100ML-% IV SOLN
0.1250 ug/kg/min | INTRAVENOUS | Status: AC
  Administered 2017-09-13: .3 ug/kg/min via INTRAVENOUS
  Filled 2017-09-13: qty 100

## 2017-09-13 MED ORDER — FAMOTIDINE IN NACL 20-0.9 MG/50ML-% IV SOLN
20.0000 mg | Freq: Two times a day (BID) | INTRAVENOUS | Status: AC
  Administered 2017-09-13 – 2017-09-14 (×2): 20 mg via INTRAVENOUS
  Filled 2017-09-13: qty 50

## 2017-09-13 MED ORDER — ARTIFICIAL TEARS OPHTHALMIC OINT
TOPICAL_OINTMENT | OPHTHALMIC | Status: AC
  Filled 2017-09-13: qty 3.5

## 2017-09-13 MED ORDER — MIDAZOLAM HCL 10 MG/2ML IJ SOLN
INTRAMUSCULAR | Status: AC
  Filled 2017-09-13: qty 2

## 2017-09-13 MED ORDER — MIDAZOLAM HCL 2 MG/2ML IJ SOLN: 2.0000 mg | INTRAMUSCULAR | Status: DC | PRN

## 2017-09-13 MED ORDER — SODIUM CHLORIDE 0.9% FLUSH
3.0000 mL | Freq: Two times a day (BID) | INTRAVENOUS | Status: DC
  Administered 2017-09-15: 3 mL via INTRAVENOUS

## 2017-09-13 MED ORDER — INSULIN REGULAR BOLUS VIA INFUSION
0.0000 [IU] | Freq: Three times a day (TID) | INTRAVENOUS | Status: DC
Start: 2017-09-13 — End: 2017-09-14
  Filled 2017-09-13: qty 10

## 2017-09-13 MED ORDER — LACTATED RINGERS IV SOLN: INTRAVENOUS | Status: DC

## 2017-09-13 MED ORDER — SODIUM CHLORIDE 0.9 % IV SOLN
INTRAVENOUS | Status: DC
  Administered 2017-09-13: 17:00:00 via INTRAVENOUS

## 2017-09-13 MED ORDER — MILRINONE LACTATE IN DEXTROSE 20-5 MG/100ML-% IV SOLN
0.0000 ug/kg/min | INTRAVENOUS | Status: DC
  Administered 2017-09-13: 0.3 ug/kg/min via INTRAVENOUS
  Administered 2017-09-14: 0.5 ug/kg/min via INTRAVENOUS
  Administered 2017-09-15: 0.3 ug/kg/min via INTRAVENOUS
  Filled 2017-09-13 (×3): qty 100

## 2017-09-13 MED ORDER — FENTANYL CITRATE (PF) 250 MCG/5ML IJ SOLN
INTRAMUSCULAR | Status: AC
  Filled 2017-09-13: qty 30

## 2017-09-13 MED ORDER — BUPIVACAINE 0.5 % ON-Q PUMP SINGLE CATH 400 ML
400.0000 mL | INJECTION | Status: DC
  Filled 2017-09-13: qty 400

## 2017-09-13 MED ORDER — SODIUM CHLORIDE 0.9 % IV SOLN
INTRAVENOUS | Status: DC
  Filled 2017-09-13: qty 1

## 2017-09-13 MED ORDER — SODIUM CHLORIDE 0.45 % IV SOLN
INTRAVENOUS | Status: DC | PRN
  Administered 2017-09-13: 17:00:00 via INTRAVENOUS

## 2017-09-13 MED ORDER — METOPROLOL TARTRATE 12.5 MG HALF TABLET: 12.5000 mg | ORAL_TABLET | Freq: Two times a day (BID) | ORAL | Status: DC

## 2017-09-13 MED ORDER — ASPIRIN 81 MG PO CHEW: 324.0000 mg | CHEWABLE_TABLET | Freq: Every day | ORAL | Status: DC

## 2017-09-13 MED ORDER — FENTANYL CITRATE (PF) 250 MCG/5ML IJ SOLN
INTRAMUSCULAR | Status: DC | PRN
  Administered 2017-09-13: 250 ug via INTRAVENOUS
  Administered 2017-09-13: 50 ug via INTRAVENOUS
  Administered 2017-09-13: 150 ug via INTRAVENOUS
  Administered 2017-09-13 (×4): 100 ug via INTRAVENOUS
  Administered 2017-09-13: 350 ug via INTRAVENOUS
  Administered 2017-09-13: 100 ug via INTRAVENOUS
  Administered 2017-09-13: 150 ug via INTRAVENOUS
  Administered 2017-09-13: 50 ug via INTRAVENOUS

## 2017-09-13 MED ORDER — OXYCODONE HCL 5 MG PO TABS
5.0000 mg | ORAL_TABLET | ORAL | Status: DC | PRN
  Administered 2017-09-14 – 2017-09-15 (×7): 5 mg via ORAL
  Filled 2017-09-13: qty 2
  Filled 2017-09-13 (×5): qty 1

## 2017-09-13 MED ORDER — SODIUM CHLORIDE 0.9 % IV SOLN
0.0000 ug/min | INTRAVENOUS | Status: DC
  Filled 2017-09-13: qty 2

## 2017-09-13 MED ORDER — PROTAMINE SULFATE 10 MG/ML IV SOLN
INTRAVENOUS | Status: DC | PRN
  Administered 2017-09-13 (×3): 50 mg via INTRAVENOUS
  Administered 2017-09-13: 30 mg via INTRAVENOUS

## 2017-09-13 MED ORDER — PROPOFOL 10 MG/ML IV BOLUS
INTRAVENOUS | Status: DC | PRN
  Administered 2017-09-13: 75 mg via INTRAVENOUS

## 2017-09-13 MED ORDER — SUCCINYLCHOLINE CHLORIDE 200 MG/10ML IV SOSY
PREFILLED_SYRINGE | INTRAVENOUS | Status: AC
Start: 2017-09-13 — End: 2017-09-13
  Filled 2017-09-13: qty 10

## 2017-09-13 MED ORDER — METOCLOPRAMIDE HCL 5 MG/ML IJ SOLN
10.0000 mg | Freq: Four times a day (QID) | INTRAMUSCULAR | Status: DC
  Administered 2017-09-14 – 2017-09-17 (×13): 10 mg via INTRAVENOUS
  Filled 2017-09-13 (×13): qty 2

## 2017-09-13 MED ORDER — MIDAZOLAM HCL 5 MG/5ML IJ SOLN
INTRAMUSCULAR | Status: DC | PRN
  Administered 2017-09-13 (×5): 2 mg via INTRAVENOUS

## 2017-09-13 MED ORDER — MORPHINE SULFATE (PF) 4 MG/ML IV SOLN
1.0000 mg | INTRAVENOUS | Status: DC | PRN
  Administered 2017-09-16: 2 mg via INTRAVENOUS
  Filled 2017-09-13: qty 1

## 2017-09-13 MED ORDER — ASPIRIN EC 325 MG PO TBEC
325.0000 mg | DELAYED_RELEASE_TABLET | Freq: Every day | ORAL | Status: DC
  Administered 2017-09-14: 325 mg via ORAL
  Filled 2017-09-13: qty 1

## 2017-09-13 MED ORDER — BISACODYL 5 MG PO TBEC
10.0000 mg | DELAYED_RELEASE_TABLET | Freq: Every day | ORAL | Status: DC
  Administered 2017-09-14 – 2017-09-17 (×4): 10 mg via ORAL
  Filled 2017-09-13 (×7): qty 2

## 2017-09-13 MED ORDER — NITROGLYCERIN IN D5W 200-5 MCG/ML-% IV SOLN: 0.0000 ug/min | INTRAVENOUS | Status: DC

## 2017-09-13 MED ORDER — EPHEDRINE 5 MG/ML INJ
INTRAVENOUS | Status: AC
  Filled 2017-09-13: qty 10

## 2017-09-13 MED ORDER — MAGNESIUM SULFATE 4 GM/100ML IV SOLN
4.0000 g | Freq: Once | INTRAVENOUS | Status: AC
  Administered 2017-09-13: 4 g via INTRAVENOUS
  Filled 2017-09-13: qty 100

## 2017-09-13 MED ORDER — SODIUM CHLORIDE 0.9 % IV SOLN
INTRAVENOUS | Status: DC
  Administered 2017-09-13: 18:00:00 via INTRAVENOUS

## 2017-09-13 MED ORDER — LEVOFLOXACIN IN D5W 750 MG/150ML IV SOLN
750.0000 mg | INTRAVENOUS | Status: AC
  Administered 2017-09-14: 750 mg via INTRAVENOUS
  Filled 2017-09-13: qty 150

## 2017-09-13 MED ORDER — ALBUMIN HUMAN 5 % IV SOLN
250.0000 mL | INTRAVENOUS | Status: AC | PRN
  Administered 2017-09-13 (×3): 250 mL via INTRAVENOUS
  Filled 2017-09-13: qty 250

## 2017-09-13 SURGICAL SUPPLY — 102 items
ADAPTER CARDIO PERF ANTE/RETRO (ADAPTER) ×3 IMPLANT
ADH SKN CLS APL DERMABOND .7 (GAUZE/BANDAGES/DRESSINGS) ×2
ADPR PRFSN 84XANTGRD RTRGD (ADAPTER) ×2
BAG DECANTER FOR FLEXI CONT (MISCELLANEOUS) ×6 IMPLANT
BLADE SURG 11 STRL SS (BLADE) ×3 IMPLANT
CANISTER SUCT 3000ML PPV (MISCELLANEOUS) ×6 IMPLANT
CANNULA FEM VENOUS REMOTE 22FR (CANNULA) ×1 IMPLANT
CANNULA FEMORAL ART 14 SM (MISCELLANEOUS) ×3 IMPLANT
CANNULA GUNDRY RCSP 15FR (MISCELLANEOUS) ×3 IMPLANT
CANNULA OPTISITE PERFUSION 16F (CANNULA) IMPLANT
CANNULA OPTISITE PERFUSION 18F (CANNULA) ×1 IMPLANT
CANNULA SUMP PERICARDIAL (CANNULA) ×5 IMPLANT
CATH KIT ON Q 5IN SLV (PAIN MANAGEMENT) IMPLANT
CATH KIT ON-Q SILVERSOAK 5 (CATHETERS) IMPLANT
CATH KIT ON-Q SILVERSOAK 5IN (CATHETERS) ×3 IMPLANT
CLIP VESOCCLUDE MED 24/CT (CLIP) ×1 IMPLANT
CONN ST 1/4X3/8  BEN (MISCELLANEOUS) ×2
CONN ST 1/4X3/8 BEN (MISCELLANEOUS) ×4 IMPLANT
CONNECTOR 1/2X3/8X1/2 3 WAY (MISCELLANEOUS) ×1
CONNECTOR 1/2X3/8X1/2 3WAY (MISCELLANEOUS) ×2 IMPLANT
CONT SPEC 4OZ CLIKSEAL STRL BL (MISCELLANEOUS) ×3 IMPLANT
COUNTER NEEDLE 20 DBL MAG RED (NEEDLE) ×1 IMPLANT
COVER BACK TABLE 24X17X13 BIG (DRAPES) ×3 IMPLANT
CRADLE DONUT ADULT HEAD (MISCELLANEOUS) ×3 IMPLANT
DERMABOND ADVANCED (GAUZE/BANDAGES/DRESSINGS) ×1
DERMABOND ADVANCED .7 DNX12 (GAUZE/BANDAGES/DRESSINGS) ×4 IMPLANT
DEVICE PMI PUNCTURE CLOSURE (MISCELLANEOUS) ×3 IMPLANT
DEVICE SUT CK QUICK LOAD INDV (Prosthesis & Implant Heart) ×6 IMPLANT
DEVICE SUT CK QUICK LOAD MINI (Prosthesis & Implant Heart) ×1 IMPLANT
DEVICE TROCAR PUNCTURE CLOSURE (ENDOMECHANICALS) ×3 IMPLANT
DRAIN CHANNEL 28F RND 3/8 FF (WOUND CARE) ×6 IMPLANT
DRAPE BILATERAL SPLIT (DRAPES) ×3 IMPLANT
DRAPE C-ARM 42X72 X-RAY (DRAPES) ×3 IMPLANT
DRAPE CV SPLIT W-CLR ANES SCRN (DRAPES) ×3 IMPLANT
DRAPE INCISE IOBAN 66X45 STRL (DRAPES) ×7 IMPLANT
DRAPE SLUSH/WARMER DISC (DRAPES) ×3 IMPLANT
DRSG COVADERM 4X8 (GAUZE/BANDAGES/DRESSINGS) ×3 IMPLANT
ELECT BLADE 6.5 EXT (BLADE) ×3 IMPLANT
ELECT REM PT RETURN 9FT ADLT (ELECTROSURGICAL) ×6
ELECTRODE REM PT RTRN 9FT ADLT (ELECTROSURGICAL) ×4 IMPLANT
FELT TEFLON 1X6 (MISCELLANEOUS) ×7 IMPLANT
FEMORAL VENOUS CANN RAP (CANNULA) IMPLANT
GAUZE SPONGE 4X4 12PLY STRL LF (GAUZE/BANDAGES/DRESSINGS) ×1 IMPLANT
GLOVE ORTHO TXT STRL SZ7.5 (GLOVE) ×9 IMPLANT
GOWN STRL REUS W/ TWL LRG LVL3 (GOWN DISPOSABLE) ×8 IMPLANT
GOWN STRL REUS W/TWL LRG LVL3 (GOWN DISPOSABLE) ×18
KIT BASIN OR (CUSTOM PROCEDURE TRAY) ×3 IMPLANT
KIT DEVICE SUT COR-KNOT MIS 5 (INSTRUMENTS) ×1 IMPLANT
KIT DILATOR VASC 18G NDL (KITS) ×3 IMPLANT
KIT DRAINAGE VACCUM ASSIST (KITS) ×1 IMPLANT
KIT ROOM TURNOVER OR (KITS) ×3 IMPLANT
KIT SUCTION CATH 14FR (SUCTIONS) ×3 IMPLANT
KIT SUT CK MINI COMBO 4X17 (Prosthesis & Implant Heart) ×1 IMPLANT
LEAD PACING MYOCARDI (MISCELLANEOUS) ×3 IMPLANT
LINE VENT (MISCELLANEOUS) ×1 IMPLANT
NDL AORTIC ROOT 14G 7F (CATHETERS) ×2 IMPLANT
NEEDLE AORTIC ROOT 14G 7F (CATHETERS) ×6 IMPLANT
NS IRRIG 1000ML POUR BTL (IV SOLUTION) ×15 IMPLANT
PACK OPEN HEART (CUSTOM PROCEDURE TRAY) ×3 IMPLANT
PAD ARMBOARD 7.5X6 YLW CONV (MISCELLANEOUS) ×6 IMPLANT
PAD ELECT DEFIB RADIOL ZOLL (MISCELLANEOUS) ×3 IMPLANT
SET CANNULATION TOURNIQUET (MISCELLANEOUS) ×3 IMPLANT
SET CARDIOPLEGIA MPS 5001102 (MISCELLANEOUS) ×1 IMPLANT
SET IRRIG TUBING LAPAROSCOPIC (IRRIGATION / IRRIGATOR) ×4 IMPLANT
SOLUTION ANTI FOG 6CC (MISCELLANEOUS) ×3 IMPLANT
SPONGE GAUZE 4X4 12PLY STER LF (GAUZE/BANDAGES/DRESSINGS) ×3 IMPLANT
SUT BONE WAX W31G (SUTURE) ×3 IMPLANT
SUT E-PACK MINIMALLY INVASIVE (SUTURE) ×3 IMPLANT
SUT ETHIBON 2 0 V 52N 30 (SUTURE) ×1 IMPLANT
SUT ETHIBOND 2 0 SH (SUTURE) ×2 IMPLANT
SUT ETHIBOND X763 2 0 SH 1 (SUTURE) ×3 IMPLANT
SUT GORETEX CV 4 TH 22 36 (SUTURE) ×3 IMPLANT
SUT GORETEX CV4 TH-18 (SUTURE) ×6 IMPLANT
SUT PROLENE 3 0 SH DA (SUTURE) ×12 IMPLANT
SUT PROLENE 3 0 SH1 36 (SUTURE) ×14 IMPLANT
SUT PROLENE 4 0 RB 1 (SUTURE) ×3
SUT PROLENE 4-0 RB1 .5 CRCL 36 (SUTURE) IMPLANT
SUT PROLENE 6 0 C 1 30 (SUTURE) ×3 IMPLANT
SUT SILK  1 MH (SUTURE) ×3
SUT SILK 1 MH (SUTURE) IMPLANT
SUT SILK 2 0 SH CR/8 (SUTURE) IMPLANT
SUT SILK 3 0 SH CR/8 (SUTURE) IMPLANT
SUT VIC AB 2-0 CTX 36 (SUTURE) IMPLANT
SUT VIC AB 3-0 SH 8-18 (SUTURE) ×1 IMPLANT
SUT VICRYL 2 TP 1 (SUTURE) IMPLANT
SYR 10ML LL (SYRINGE) ×3 IMPLANT
SYSTEM SAHARA CHEST DRAIN ATS (WOUND CARE) ×3 IMPLANT
TAPE CLOTH SURG 4X10 WHT LF (GAUZE/BANDAGES/DRESSINGS) ×1 IMPLANT
TAPE PAPER 2X10 WHT MICROPORE (GAUZE/BANDAGES/DRESSINGS) ×1 IMPLANT
TOWEL GREEN STERILE (TOWEL DISPOSABLE) ×12 IMPLANT
TOWEL GREEN STERILE FF (TOWEL DISPOSABLE) ×6 IMPLANT
TOWEL OR 17X24 6PK STRL BLUE (TOWEL DISPOSABLE) ×6 IMPLANT
TOWEL OR 17X26 10 PK STRL BLUE (TOWEL DISPOSABLE) ×6 IMPLANT
TRAY FOLEY SILVER 16FR TEMP (SET/KITS/TRAYS/PACK) ×3 IMPLANT
TROCAR XCEL BLADELESS 5X75MML (TROCAR) ×3 IMPLANT
TROCAR XCEL NON-BLD 11X100MML (ENDOMECHANICALS) ×6 IMPLANT
TUBE SUCT INTRACARD DLP 20F (MISCELLANEOUS) ×3 IMPLANT
TUNNELER SHEATH ON-Q 11GX8 DSP (PAIN MANAGEMENT) IMPLANT
UNDERPAD 30X30 (UNDERPADS AND DIAPERS) ×3 IMPLANT
VALVE MITRAL SZ 25 (Prosthesis & Implant Heart) ×1 IMPLANT
WATER STERILE IRR 1000ML POUR (IV SOLUTION) ×6 IMPLANT
WIRE .035 3MM-J 145CM (WIRE) ×3 IMPLANT

## 2017-09-13 NOTE — Progress Notes (Signed)
This RT attempted to start the rapid wean on patient but patient was still to somnolent to complete the wean trial. RT will attempt wean again when patient is more awake.

## 2017-09-13 NOTE — Progress Notes (Signed)
Patient ID: Patricia [name], female   DOB: [birth year], 66 y.o.   MRN: [id number]  TCTS Evening Rounds:   Hemodynamically stable  CI = 1.7 on milrinone 0.5 and dop 1  Still asleep on vent  Urine output good  CT output low  CBC    Component Value Date/Time   WBC 19.7 (H) 09/13/2017 1647   RBC 3.91 09/13/2017 1647   HGB 11.4 (L) 09/13/2017 1647   HGB 12.6 06/23/2017 0926   HGB 12.9 06/04/2012 1334   HCT 33.8 (L) 09/13/2017 1647   HCT 37.1 06/23/2017 0926   HCT 37.8 06/04/2012 1334   PLT 137 (L) 09/13/2017 1647   PLT 482 (H) 06/23/2017 0926   MCV 86.4 09/13/2017 1647   MCV 88 06/23/2017 0926   MCV 87.5 06/04/2012 1334   MCH 29.2 09/13/2017 1647   MCHC 33.7 09/13/2017 1647   RDW 14.2 09/13/2017 1647   RDW 14.3 06/23/2017 0926   RDW 14.6 (H) 06/04/2012 1334   LYMPHSABS 1,944 07/07/2016 0847   LYMPHSABS 2.3 06/04/2012 1334   MONOABS 891 07/07/2016 0847   MONOABS 0.7 06/04/2012 1334   EOSABS 243 07/07/2016 0847   EOSABS 0.1 06/04/2012 1334   BASOSABS 81 07/07/2016 0847   BASOSABS 0.1 06/04/2012 1334     BMET    Component Value Date/Time   NA 139 09/13/2017 1518   NA 141 06/23/2017 0926   K 3.9 09/13/2017 1518   CL 103 09/13/2017 1518   CO2 25 09/11/2017 1607   GLUCOSE 121 (H) 09/13/2017 1518   BUN 14 09/13/2017 1518   BUN 21 06/23/2017 0926   CREATININE 0.70 09/13/2017 1518   CREATININE 1.06 (H) 07/07/2016 0847   CALCIUM 9.4 09/11/2017 1607   GFRNONAA 58 (L) 09/11/2017 1607   GFRNONAA 55 (L) 07/07/2016 0847   GFRAA >60 09/11/2017 1607   GFRAA 64 07/07/2016 0847     A/P:  Stable postop course. Continue current plans

## 2017-09-13 NOTE — Interval H&P Note (Signed)
History and Physical Interval Note:  09/13/2017 7:13 AM  Patricia [name]  has presented today for surgery, with the diagnosis of MR  The various methods of treatment have been discussed with the patient and family. After consideration of risks, benefits and other options for treatment, the patient has consented to  Procedure(s): MINIMALLY INVASIVE MITRAL VALVE REPAIR OR REPLACEMENT (MVR) (Right) TRANSESOPHAGEAL ECHOCARDIOGRAM (TEE) (N/A) as a surgical intervention .  The patient's history has been reviewed, patient examined, no change in status, stable for surgery.  I have reviewed the patient's chart and labs.  Questions were answered to the patient's satisfaction.     Rexene Alberts

## 2017-09-13 NOTE — Progress Notes (Signed)
Assessed patient for readiness for second wean attempt. When she is asleep, she has no respiratory drive when ventilator rate is turned down to 4. When she is awake she initiates a few breaths. Will continue to assess for readiness to wean. Richarda Blade RN

## 2017-09-13 NOTE — Op Note (Signed)
CARDIOTHORACIC SURGERY OPERATIVE NOTE  Date of Procedure:  09/13/2017  Preoperative Diagnosis:   Severe Mitral Regurgitation  Mild Mitral Stenosis  Postoperative Diagnosis: Same  Procedure:    Minimally-Invasive Mitral Valve Replacement  Medtronic Mosaic porcine stented bioprosthetic tissue valve (size 25 mm, model # 310, serial # [id number])    Surgeon: Valentina Gu. 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  Type IIIA dysfunction wiht severe mitral regurgitation  Mild mitral stenosis  Mild aortic insufficiency  Normal left ventricular systolic function             BRIEF CLINICAL NOTE AND INDICATIONS FOR SURGERY  Patient is a 66 year old female with history of mitral regurgitation, aortic insufficiency, atrial flutter status post ablation in 1998, Hodgkin's lymphoma status post laparotomy for splenectomy and liver biopsy followed by chemotherapy and chest radiation 1974, 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 The patient has been followed for the last several years by Dr. Percival Spanish with known history of mitral stenosis, mitral regurgitation, and aortic insufficiency. Over the past 3 years or so the patient reports progressive symptoms of exertional fatigue and shortness of breath. Symptoms became considerably worse in January of this year at which time she presented with shortness of breath with minimal activity and occasionally at rest with severe orthopnea and episodes of PND.  Echocardiogram performed at that time revealed normal left ventricular systolic function with moderate aortic insufficiency and what was felt to be severe mitral stenosis and moderate to severe mitral regurgitation. Medications were adjusted with some degree of symptomatic improvement. She underwent left and right heart catheterization on 06/27/2017 revealing no significant coronary artery disease. There was moderate (3+) mitral regurgitation, severe mitral stenosis with mean transvalvular gradient estimated 11 mmHg, and moderate pulmonary hypertension. The patient underwent transesophageal echocardiogram demonstrating only mild to moderate central aortic insufficiency with mild mitral stenosis and moderate to severe mitral regurgitation. The patient was referred for surgical consultation and has been evaluated previously by Dr. Cyndia Bent on 07/07/2017. The patient was referred for second surgical opinion to consider the possibility of minimally invasive mitral valve repair or replacement.  The patient has been seen in consultation and counseled at length regarding the indications, risks and potential benefits of surgery.  All questions have been answered, and the patient provides full informed consent for the operation as described.    DETAILS OF THE OPERATIVE PROCEDURE  Preparation:  The patient is brought to the operating room on the above mentioned date and central monitoring was established by the anesthesia team including placement of Swan-Ganz catheter through the left internal jugular vein.  A radial arterial line is placed. The patient is placed in the supine position on the operating table.  Intravenous antibiotics are administered. General endotracheal anesthesia is induced uneventfully. The patient is initially intubated using a dual lumen endotracheal tube.  A Foley catheter is placed.  Baseline transesophageal echocardiogram was performed.  Findings were notable for normal LV size and systolic  function.  There was severe mitral regurgitation. The mitral valve was calcified with severe calcification involving the base of the anterior leaflet, much of  the posterior leaflet, and a large bar of calcium crossing the posterior commissure involving the A3 portion of the anterior leaflet and the P3 portion of the posterior leaflet. There was severe leaflet restriction with severe central regurgitation. The jet of regurgitation was directed posteriorly. There was very mild mitral stenosis. Aortic valve is trileaflet. There was mild sclerosis of the free margin of the aortic valve and only mild to moderate central aortic insufficiency.  Right ventricular size and function was normal. There was mild central tricuspid regurgitation.  A soft roll is placed behind the patient's left scapula and the neck gently extended and turned to the left.   The patient's right neck, chest, abdomen, both groins, and both lower extremities are prepared and draped in a sterile manner. A time out procedure is performed.  Surgical Approach:  A right miniature anterolateral thoracotomy incision is performed. The incision is placed just lateral to and superior to the right nipple. The pectoralis major muscle is retracted medially and completely preserved. The right pleural space is entered through the 3rd intercostal space. A soft tissue retractor is placed.  Two 11 mm ports are placed through separate stab incisions inferiorly. The right pleural space is insufflated continuously with carbon dioxide gas through the posterior port during the remainder of the operation.  A pledgeted sutures placed through the dome of the right hemidiaphragm and retracted inferiorly to facilitate exposure.  A longitudinal incision is made in the pericardium 3 cm anterior to the phrenic nerve and silk traction sutures are placed on either side of the incision for exposure.   Extracorporeal Cardiopulmonary Bypass and Myocardial Protection:  A small  incision is made in the right inguinal crease and the anterior surface of the right common femoral artery and right common femoral vein are identified.  The patient is placed in Trendelenburg position. The right internal jugular vein is cannulated with Seldinger technique and a guidewire advanced into the right atrium. The patient is heparinized systemically. The right internal jugular vein is cannulated with a 14 [place] pediatric femoral venous cannula. Pursestring sutures are placed on the anterior surface of the right common femoral vein and right common femoral artery. The right common femoral vein is cannulated with the Seldinger technique and a guidewire is advanced under transesophageal echocardiogram guidance through the right atrium. The femoral vein is cannulated with a long 22 French femoral venous cannula. The right common femoral artery is cannulated with Seldinger technique and a flexible guidewire is advanced until it can be appreciated intraluminally in the descending thoracic aorta on transesophageal echocardiogram. The femoral artery is cannulated with an 18 French femoral arterial cannula.  Adequate heparinization is verified.     The entire pre-bypass portion of the operation was notable for stable hemodynamics.  Cardiopulmonary bypass was begun.  Vacuum assist venous drainage is utilized. The incision in the pericardium is extended in both directions. Venous drainage and exposure are notably excellent. A retrograde cardioplegia cannula is placed through the right atrium into the coronary sinus using transesophageal echocardiogram guidance.  An antegrade cardioplegia cannula is placed in the ascending aorta.    The patient is cooled to 28C systemic temperature.  The aortic cross clamp is applied and cardioplegia is delivered initially in an antegrade fashion through the aortic root using modified [place] cold blood cardioplegia ([place] blood cardioplegia protocol, KBC).   The initial  cardioplegic arrest is rapid with early diastolic arrest.  A total of 1.5 L arresting doses administered. Approximately 500 mL of  the initial arresting doses administered antegrade and the remainder administered retrograde. Myocardial protection was felt to be excellent.   Mitral Valve Replacement:  A left atriotomy incision was performed through the interatrial groove and extended partially across the back wall of the left atrium after opening the oblique sinus inferiorly.  The mitral valve is exposed using a self-retaining retractor.  The mitral valve was inspected and notable for severe fibrosis and calcification consistent with radiation-induced disease. Both the anterior and posterior leaflets were severely restricted. There was dense calcification along the anterior annulus extending from the middle of the base of A2 through the posterior commissure. There is extensive bar of calcification at the posterior commissure which extended into the P3 portion of the posterior leaflet.  An attempt at valve repair is felt not feasible.  The anterior leaflet of the mitral valve is excised sharply leaving a small portion of the free rim margin near the midline attached to the primary chordae tendineae to facilitate chordal preservation. The posterior leaflet is split in the midline and debulked. The extensive calcification at the base of the A2, A3, the posterior commissure, and P3 had to be excised in piecemeal fashion using rongeurs. Calcification extended fairly far into the wall of the left atrium in the AV groove.  Decalcification was tedious.  Mitral valve replacement is performed using interrupted 2-0 Ethibond horizontal mattress pledgeted sutures with pledgets in the supra-annular position. The valve is sized to accept a 25 mm stented bioprosthetic tissue valve.  A Medtronic Mosaic porcine stented bioprosthetic tissue valve (size 25 mm, model #310, serial #[id number]) was secured in place uneventfully. All  valve sutures were secured using a Cor-knot device.  The valve was carefully inspected and the ventricle filled with saline. The valve seemed to function normally and there was no sign of perivalvular leak.  Rewarming is begun.  The left atriotomy was closed using a 2 layer closure of running 3-0 Prolene suture after placing a sump drain across the valve to serve as a left ventricular vent.  One final dose of warm retrograde "reanimation dose" cardioplegia was administered retrograde through the coronary sinus catheter while all air was evacuated through the aortic root.  The aortic cross clamp was removed after a total cross clamp time of 104 minutes.  Epicardial pacing wires are fixed to the inferior wall of the right ventricule and to the right atrial appendage. The patient is rewarmed to 37C temperature.  The retrograde and antegrade cannulas were both removed. The heart is allowed to fill and bypass volumes decreased to inspect valve function. The valve is opening and closing normally around the left ventricular vent. However, there appears to be a perivalvular leak, located at the site of extensive calcification near the posterior commissure and along the anterior annulus where the base of A3 was located.  An antegrade cardioplegia cannula is replaced into the aorta. The aortic cross clamp is replaced and cold blood cardioplegia administered antegrade through the aortic root. A single arresting dose of 1 L of KBC protocol is administered through the aortic root. The left atriotomy incision is reopened. The valve is carefully inspected. With the probe a fissure can be identified where it appears one or 2 of the valve sutures as pulled through the area of extensive calcification.  This is repaired using several horizontal mattress 2-0 Ethibond pledgeted sutures. Once the leak is been satisfactorily corrected the valve is again tested with saline and carefully probed circumferentially. Rewarming is  resumed. The left  atriotomy incision is closed using interrupted horizontal mattress 3-0 Prolene pledgeted sutures immediately adjacent to the mitral valve and a 2 layer running closure of 3-0 Prolene suture along the remainder.  One final dose of warm "reanimation dose" cardioplegia was administered antegrade through the aortic root. The aortic cross-clamp was removed after a second cross-clamp time of 80 minutes, for a grand total of 184 minutes for the entire procedure.  The heart began to be spontaneously without need for cardioversion. AV pacing was initiated. Bypass flow volumes were temporarily turndown and the heart allowed to eject while the mitral valve was carefully inspected using transesophageal echocardiography. Function appeared normal with no residual perivalvular leak.   Procedure Completion:  The left ventricular vent is removed.  The antegrade cardioplegia cannula is now removed. The patient is weaned and disconnected from cardiopulmonary bypass.  The patient's rhythm at separation from bypass was AV paced.  The patient was weaned from bypass without any inotropic support. Total cardiopulmonary bypass time for the operation was 263 minutes.  Followup transesophageal echocardiogram performed after separation from bypass revealed a well-seated bioprosthetic tissue valve the mitral position that was functioning normally. There was no residual perivalvular leak.  Left ventricular function was unchanged from preoperatively.  There was mild right ventricular dysfunction.  Low-dose milrinone infusion was initiated.  The femoral arterial and venous cannulae were removed uneventfully. There was a palpable pulse in the distal right common femoral artery after removal of the cannula. Protamine was administered to reverse the anticoagulation. The right internal jugular cannula was removed and manual pressure held on the neck for 15 minutes.  Single lung ventilation was begun. The atriotomy closure  was inspected for hemostasis. The pericardial sac was drained using a 28 French Bard drain placed through the anterior port incision.  The right pleural space is irrigated with saline solution and inspected for hemostasis. The right pleural space was drained using a 28 French Bard drain placed through the posterior port incision. The miniature thoracotomy incision was closed in multiple layers in routine fashion. The right groin incision was inspected for hemostasis and closed in multiple layers in routine fashion.  The post-bypass portion of the operation was notable for stable rhythm and hemodynamics.  The patient received 2 units packed red blood cells during the procedure due to anemia which was present preoperatively and exacerbated by acute blood loss and hemodilution during cardiopulmonary bypass.   Disposition:  The patient tolerated the procedure well.  The patient was reintubated using a single lumen endotracheal tube and subsequently transported to the surgical intensive care unit in stable condition. There were no intraoperative complications. All sponge instrument and needle counts are verified correct at completion of the operation.     Valentina Gu. Roxy Manns MD 09/13/2017 4:17 PM

## 2017-09-13 NOTE — Anesthesia Procedure Notes (Addendum)
Central Venous Catheter Insertion Performed by: Roberts Gaudy, anesthesiologist Start/End10/02/2017 6:45 AM, 09/13/2017 6:50 AM Patient location: Pre-op. Preanesthetic checklist: patient identified, IV checked, site marked, risks and benefits discussed, surgical consent, monitors and equipment checked, pre-op evaluation and timeout performed Position: supine Hand hygiene performed , maximum sterile barriers used  and Seldinger technique used Catheter size: 8.5 Fr PA cath was placed.Sheath introducer Swan type:thermodilution and oximetry Procedure performed using ultrasound guided technique. Ultrasound Notes:anatomy identified, needle tip was noted to be adjacent to the nerve/plexus identified and no ultrasound evidence of intravascular and/or intraneural injection Attempts: 1 Following insertion, line sutured, dressing applied and Biopatch. Post procedure assessment: blood return through all ports, free fluid flow and no air  Patient tolerated the procedure well with no immediate complications.

## 2017-09-13 NOTE — Anesthesia Procedure Notes (Signed)
Procedure Name: Intubation Date/Time: 09/13/2017 8:52 AM Performed by: Willeen Cass P Pre-anesthesia Checklist: Patient identified, Emergency Drugs available, Suction available and Patient being monitored Patient Re-evaluated:Patient Re-evaluated prior to induction Oxygen Delivery Method: Circle System Utilized Preoxygenation: Pre-oxygenation with 100% oxygen Induction Type: IV induction Ventilation: Mask ventilation without difficulty Laryngoscope Size: Mac and 3 Grade View: Grade I Tube type: Oral Endobronchial tube: Left, EBT position confirmed by fiberoptic bronchoscope, Double lumen EBT and EBT position confirmed by auscultation and 37 Fr Number of attempts: 1 Airway Equipment and Method: Stylet and Oral airway Placement Confirmation: ETT inserted through vocal cords under direct vision,  positive ETCO2 and breath sounds checked- equal and bilateral Secured at: 29 cm Tube secured with: Tape Dental Injury: Teeth and Oropharynx as per pre-operative assessment

## 2017-09-13 NOTE — Brief Op Note (Signed)
09/13/2017  4:14 PM  PATIENT:  Patricia [name]  66 y.o. female  PRE-OPERATIVE DIAGNOSIS:  Mitral valve regurgitation  POST-OPERATIVE DIAGNOSIS:  Mitral valve regurgitation  PROCEDURE:  Procedure(s): MINIMALLY INVASIVE MITRAL VALVE REPLACEMENT (MVR) with Medtronic Mosaic Porcine Heart Valve size 40mm (Right) TRANSESOPHAGEAL ECHOCARDIOGRAM (TEE) (N/A)  SURGEON:  Surgeon(s) and Role:    Rexene Alberts, MD - Primary  PHYSICIAN ASSISTANT: Otho Ket, PA-S   ASSISTANTS: Ara Kussmaul, CRNFA   ANESTHESIA:   general  EBL:  Total I/O In: 9977 [I.V.:1000; Blood:530; IV Piggyback:250] Out: 61 [Urine:2100; Blood:1800]  BLOOD ADMINISTERED:2 units CC PRBC  DRAINS: 2 Chest Tube(s) in the right pleural space   LOCAL MEDICATIONS USED:  NONE  SPECIMEN:  Source of Specimen:  portion of mitral valve  DISPOSITION OF SPECIMEN:  PATHOLOGY  COUNTS:  YES  TOURNIQUET:  * No tourniquets in log *  DICTATION: .Note written in EPIC  PLAN OF CARE: Admit to inpatient   PATIENT DISPOSITION:  ICU - intubated and hemodynamically stable.   Delay start of Pharmacological VTE agent (>24hrs) due to surgical blood loss or risk of bleeding: yes  Rexene Alberts, MD 09/13/2017 4:14 PM

## 2017-09-13 NOTE — Transfer of Care (Signed)
Immediate Anesthesia Transfer of Care Note  Patient: Patricia [name]  Procedure(s) Performed: MINIMALLY INVASIVE MITRAL VALVE REPLACEMENT (MVR) with Medtronic Mosaic Porcine Heart Valve size 57mm (Right Chest) TRANSESOPHAGEAL ECHOCARDIOGRAM (TEE) (N/A )  Patient Location: SICU  Anesthesia Type:General  Level of Consciousness: Patient remains intubated per anesthesia plan  Airway & Oxygen Therapy: Patient remains intubated per anesthesia plan and Patient placed on Ventilator (see vital sign flow sheet for setting)  Post-op Assessment: Report given to RN and Post -op Vital signs reviewed and stable  Post vital signs: Reviewed and stable  Last Vitals:  Vitals:   09/13/17 0817 09/13/17 1632  BP:  (!) 107/57  Pulse: 81 80  Resp: 11 12  Temp:    SpO2: 97% 100%    Last Pain:  Vitals:   09/13/17 0629  TempSrc: Oral         Complications: No apparent anesthesia complications

## 2017-09-13 NOTE — Anesthesia Procedure Notes (Signed)
Central Venous Catheter Insertion Performed by: Roberts Gaudy, anesthesiologist Start/End10/02/2017 6:55 AM, 09/13/2017 7:00 AM Patient location: Pre-op. Preanesthetic checklist: patient identified, IV checked, site marked, risks and benefits discussed, surgical consent, monitors and equipment checked, pre-op evaluation and timeout performed Position: supine Hand hygiene performed , maximum sterile barriers used  and Seldinger technique used Catheter size: 8 Fr Central line was placed.Double lumen Procedure performed using ultrasound guided technique. Ultrasound Notes:anatomy identified, needle tip was noted to be adjacent to the nerve/plexus identified and no ultrasound evidence of intravascular and/or intraneural injection Attempts: 1 Following insertion, line sutured, dressing applied and Biopatch. Post procedure assessment: blood return through all ports, free fluid flow and no air  Patient tolerated the procedure well with no immediate complications.

## 2017-09-13 NOTE — Anesthesia Procedure Notes (Signed)
Arterial Line Insertion Start/End10/02/2017 7:45 AM, 09/13/2017 7:50 AM Performed by: Lowella Dell, CRNA  Patient location: Pre-op. Preanesthetic checklist: patient identified, IV checked, risks and benefits discussed, surgical consent, monitors and equipment checked, pre-op evaluation and timeout performed Lidocaine 1% used for infiltration and patient sedated radial was placed Catheter size: 20 G Hand hygiene performed  and maximum sterile barriers used  Allen's test indicative of satisfactory collateral circulation Attempts: 1 Procedure performed without using ultrasound guided technique. Following insertion, dressing applied and Biopatch. Post procedure assessment: normal  Patient tolerated the procedure well with no immediate complications.

## 2017-09-13 NOTE — Anesthesia Preprocedure Evaluation (Addendum)
Anesthesia Evaluation  Patient identified by MRN, date of birth, ID band Patient awake    History of Anesthesia Complications Negative for: history of anesthetic complications  Airway Mallampati: I  TM Distance: >3 FB Neck ROM: Full    Dental  (+) Teeth Intact, Dental Advisory Given   Pulmonary    Pulmonary exam normal breath sounds clear to auscultation       Cardiovascular hypertension, Pt. on medications and Pt. on home beta blockers Normal cardiovascular exam Rhythm:Regular Rate:Normal + Systolic murmurs    Neuro/Psych    GI/Hepatic GERD  Controlled and Medicated,  Endo/Other    Renal/GU      Musculoskeletal   Abdominal   Peds  Hematology   Anesthesia Other Findings   Reproductive/Obstetrics                            Anesthesia Physical Anesthesia Plan  ASA: III  Anesthesia Plan: General   Post-op Pain Management:    Induction: Intravenous  PONV Risk Score and Plan: Ondansetron and Dexamethasone  Airway Management Planned: Double Lumen EBT  Additional Equipment: PA Cath, CVP, 3D TEE and Arterial line  Intra-op Plan:   Post-operative Plan: Post-operative intubation/ventilation  Informed Consent: I have reviewed the patients History and Physical, chart, labs and discussed the procedure including the risks, benefits and alternatives for the proposed anesthesia with the patient or authorized representative who has indicated his/her understanding and acceptance.   Dental advisory given  Plan Discussed with: CRNA and Anesthesiologist  Anesthesia Plan Comments:         Anesthesia Quick Evaluation

## 2017-09-14 ENCOUNTER — Encounter (HOSPITAL_COMMUNITY): Payer: Self-pay | Admitting: Thoracic Surgery (Cardiothoracic Vascular Surgery)

## 2017-09-14 ENCOUNTER — Inpatient Hospital Stay (HOSPITAL_COMMUNITY): Payer: Medicare Other

## 2017-09-14 LAB — CREATININE, SERUM
Creatinine, Ser: 0.97 mg/dL (ref 0.44–1.00)
Creatinine, Ser: 1.23 mg/dL — ABNORMAL HIGH (ref 0.44–1.00)
GFR calc Af Amer: >60 mL/min (ref >60)
GFR calc Af Amer: 52 mL/min — ABNORMAL LOW (ref >60)
GFR calc non Af Amer: 60 mL/min — ABNORMAL LOW (ref >60)
GFR calc non Af Amer: 45 mL/min — ABNORMAL LOW (ref >60)

## 2017-09-14 LAB — CBC
HCT: 29.8 % — ABNORMAL LOW (ref 36.0–46.0)
HCT: 30.9 % — ABNORMAL LOW (ref 36.0–46.0)
Hemoglobin: 10.4 g/dL — ABNORMAL LOW (ref 12.0–15.0)
Hemoglobin: 10.4 g/dL — ABNORMAL LOW (ref 12.0–15.0)
MCH: 30.1 pg (ref 26.0–34.0)
MCH: 29.5 pg (ref 26.0–34.0)
MCHC: 34.9 g/dL (ref 30.0–36.0)
MCHC: 33.7 g/dL (ref 30.0–36.0)
MCV: 86.4 fL (ref 78.0–100.0)
MCV: 87.8 fL (ref 78.0–100.0)
Platelets: 152 10*3/uL (ref 150–400)
Platelets: 147 10*3/uL — ABNORMAL LOW (ref 150–400)
RBC: 3.45 MIL/uL — ABNORMAL LOW (ref 3.87–5.11)
RBC: 3.52 MIL/uL — ABNORMAL LOW (ref 3.87–5.11)
RDW: 15.1 % (ref 11.5–15.5)
RDW: 15.5 % (ref 11.5–15.5)
WBC: 18 10*3/uL — ABNORMAL HIGH (ref 4.0–10.5)
WBC: 23.7 10*3/uL — ABNORMAL HIGH (ref 4.0–10.5)

## 2017-09-14 LAB — GLUCOSE, CAPILLARY
Glucose-Capillary: 111 mg/dL — ABNORMAL HIGH (ref 65–99)
Glucose-Capillary: 121 mg/dL — ABNORMAL HIGH (ref 65–99)
Glucose-Capillary: 121 mg/dL — ABNORMAL HIGH (ref 65–99)
Glucose-Capillary: 123 mg/dL — ABNORMAL HIGH (ref 65–99)
Glucose-Capillary: 124 mg/dL — ABNORMAL HIGH (ref 65–99)
Glucose-Capillary: 125 mg/dL — ABNORMAL HIGH (ref 65–99)
Glucose-Capillary: 129 mg/dL — ABNORMAL HIGH (ref 65–99)
Glucose-Capillary: 138 mg/dL — ABNORMAL HIGH (ref 65–99)
Glucose-Capillary: 139 mg/dL — ABNORMAL HIGH (ref 65–99)
Glucose-Capillary: 178 mg/dL — ABNORMAL HIGH (ref 65–99)
Glucose-Capillary: 181 mg/dL — ABNORMAL HIGH (ref 65–99)
Glucose-Capillary: 191 mg/dL — ABNORMAL HIGH (ref 65–99)
Glucose-Capillary: 200 mg/dL — ABNORMAL HIGH (ref 65–99)
Glucose-Capillary: 98 mg/dL (ref 65–99)

## 2017-09-14 LAB — POCT I-STAT, CHEM 8
BUN: 15 mg/dL (ref 6–20)
BUN: 19 mg/dL (ref 6–20)
Calcium, Ion: 1.21 mmol/L (ref 1.15–1.40)
Calcium, Ion: 1.11 mmol/L — ABNORMAL LOW (ref 1.15–1.40)
Chloride: 105 mmol/L (ref 101–111)
Chloride: 99 mmol/L — ABNORMAL LOW (ref 101–111)
Creatinine, Ser: 0.8 mg/dL (ref 0.44–1.00)
Creatinine, Ser: 1.1 mg/dL — ABNORMAL HIGH (ref 0.44–1.00)
Glucose, Bld: 114 mg/dL — ABNORMAL HIGH (ref 65–99)
Glucose, Bld: 174 mg/dL — ABNORMAL HIGH (ref 65–99)
HCT: 31 % — ABNORMAL LOW (ref 36.0–46.0)
HCT: 30 % — ABNORMAL LOW (ref 36.0–46.0)
Hemoglobin: 10.5 g/dL — ABNORMAL LOW (ref 12.0–15.0)
Hemoglobin: 10.2 g/dL — ABNORMAL LOW (ref 12.0–15.0)
Potassium: 4 mmol/L (ref 3.5–5.1)
Potassium: 3.7 mmol/L (ref 3.5–5.1)
Sodium: 141 mmol/L (ref 135–145)
Sodium: 137 mmol/L (ref 135–145)
TCO2: 23 mmol/L (ref 22–32)
TCO2: 24 mmol/L (ref 22–32)

## 2017-09-14 LAB — MAGNESIUM
Magnesium: 3.1 mg/dL — ABNORMAL HIGH (ref 1.7–2.4)
Magnesium: 2.4 mg/dL (ref 1.7–2.4)
Magnesium: 2.4 mg/dL (ref 1.7–2.4)

## 2017-09-14 LAB — POCT I-STAT 3, ART BLOOD GAS (G3+)
Acid-base deficit: 1 mmol/L (ref 0.0–2.0)
Acid-base deficit: 4 mmol/L — ABNORMAL HIGH (ref 0.0–2.0)
Acid-base deficit: 2 mmol/L (ref 0.0–2.0)
Bicarbonate: 23.9 mmol/L (ref 20.0–28.0)
Bicarbonate: 21.4 mmol/L (ref 20.0–28.0)
Bicarbonate: 23.6 mmol/L (ref 20.0–28.0)
O2 Saturation: 100 %
O2 Saturation: 99 %
O2 Saturation: 98 %
Patient temperature: 35.5
Patient temperature: 36.8
Patient temperature: 37
TCO2: 25 mmol/L (ref 22–32)
TCO2: 23 mmol/L (ref 22–32)
TCO2: 25 mmol/L (ref 22–32)
pCO2 arterial: 36.3 mmHg (ref 32.0–48.0)
pCO2 arterial: 40.4 mmHg (ref 32.0–48.0)
pCO2 arterial: 42.5 mmHg (ref 32.0–48.0)
pH, Arterial: 7.42 (ref 7.350–7.450)
pH, Arterial: 7.332 — ABNORMAL LOW (ref 7.350–7.450)
pH, Arterial: 7.353 (ref 7.350–7.450)
pO2, Arterial: 180 mmHg — ABNORMAL HIGH (ref 83.0–108.0)
pO2, Arterial: 133 mmHg — ABNORMAL HIGH (ref 83.0–108.0)
pO2, Arterial: 106 mmHg (ref 83.0–108.0)

## 2017-09-14 LAB — POCT I-STAT 4, (NA,K, GLUC, HGB,HCT)
Glucose, Bld: 90 mg/dL (ref 65–99)
HCT: 33 % — ABNORMAL LOW (ref 36.0–46.0)
Hemoglobin: 11.2 g/dL — ABNORMAL LOW (ref 12.0–15.0)
Potassium: 3.5 mmol/L (ref 3.5–5.1)
Sodium: 142 mmol/L (ref 135–145)

## 2017-09-14 LAB — BASIC METABOLIC PANEL
Anion gap: 8 (ref 5–15)
BUN: 13 mg/dL (ref 6–20)
CO2: 25 mmol/L (ref 22–32)
Calcium: 7.8 mg/dL — ABNORMAL LOW (ref 8.9–10.3)
Chloride: 107 mmol/L (ref 101–111)
Creatinine, Ser: 0.99 mg/dL (ref 0.44–1.00)
GFR calc Af Amer: >60 mL/min (ref >60)
GFR calc non Af Amer: 58 mL/min — ABNORMAL LOW (ref >60)
Glucose, Bld: 134 mg/dL — ABNORMAL HIGH (ref 65–99)
Potassium: 3.8 mmol/L (ref 3.5–5.1)
Sodium: 140 mmol/L (ref 135–145)

## 2017-09-14 LAB — HEMOGLOBIN AND HEMATOCRIT, BLOOD
HCT: 21 % — ABNORMAL LOW (ref 36.0–46.0)
Hemoglobin: 7.1 g/dL — ABNORMAL LOW (ref 12.0–15.0)

## 2017-09-14 MED ORDER — WARFARIN - PHYSICIAN DOSING INPATIENT
Freq: Every day | Status: DC
  Administered 2017-09-15 – 2017-09-19 (×4)

## 2017-09-14 MED ORDER — POTASSIUM CHLORIDE 10 MEQ/50ML IV SOLN
10.0000 meq | INTRAVENOUS | Status: AC
  Administered 2017-09-14 (×3): 10 meq via INTRAVENOUS
  Filled 2017-09-14 (×3): qty 50

## 2017-09-14 MED ORDER — SODIUM CHLORIDE 0.9% FLUSH
10.0000 mL | Freq: Two times a day (BID) | INTRAVENOUS | Status: DC
  Administered 2017-09-14: 10 mL
  Administered 2017-09-15: 20 mL

## 2017-09-14 MED ORDER — CHLORHEXIDINE GLUCONATE CLOTH 2 % EX PADS
6.0000 | MEDICATED_PAD | Freq: Every day | CUTANEOUS | Status: DC
  Administered 2017-09-15: 6 via TOPICAL

## 2017-09-14 MED ORDER — FUROSEMIDE 10 MG/ML IJ SOLN
20.0000 mg | Freq: Four times a day (QID) | INTRAMUSCULAR | Status: AC
  Administered 2017-09-14 – 2017-09-15 (×3): 20 mg via INTRAVENOUS
  Filled 2017-09-14 (×3): qty 2

## 2017-09-14 MED ORDER — WARFARIN SODIUM 2.5 MG PO TABS
2.5000 mg | ORAL_TABLET | Freq: Every day | ORAL | Status: DC
  Administered 2017-09-14 – 2017-09-20 (×7): 2.5 mg via ORAL
  Filled 2017-09-14 (×7): qty 1

## 2017-09-14 MED ORDER — SODIUM CHLORIDE 0.9% FLUSH: 10.0000 mL | INTRAVENOUS | Status: DC | PRN

## 2017-09-14 MED ORDER — INSULIN ASPART 100 UNIT/ML ~~LOC~~ SOLN
0.0000 [IU] | SUBCUTANEOUS | Status: DC
  Administered 2017-09-14 (×3): 4 [IU] via SUBCUTANEOUS
  Administered 2017-09-15 (×2): 2 [IU] via SUBCUTANEOUS

## 2017-09-14 MED FILL — Electrolyte-R (PH 7.4) Solution: INTRAVENOUS | Qty: 6000 | Status: AC

## 2017-09-14 MED FILL — Albumin, Human Inj 5%: INTRAVENOUS | Qty: 250 | Status: AC

## 2017-09-14 MED FILL — Heparin Sodium (Porcine) Inj 1000 Unit/ML: INTRAMUSCULAR | Qty: 20 | Status: AC

## 2017-09-14 MED FILL — Potassium Chloride Inj 2 mEq/ML: INTRAVENOUS | Qty: 40 | Status: AC

## 2017-09-14 MED FILL — Sodium Chloride IV Soln 0.9%: INTRAVENOUS | Qty: 2000 | Status: AC

## 2017-09-14 MED FILL — Lidocaine HCl IV Inj 20 MG/ML: INTRAVENOUS | Qty: 40 | Status: AC

## 2017-09-14 MED FILL — Sodium Bicarbonate IV Soln 8.4%: INTRAVENOUS | Qty: 50 | Status: AC

## 2017-09-14 MED FILL — Mannitol IV Soln 20%: INTRAVENOUS | Qty: 1500 | Status: AC

## 2017-09-14 MED FILL — Calcium Chloride Inj 10%: INTRAVENOUS | Qty: 10 | Status: AC

## 2017-09-14 MED FILL — Magnesium Sulfate Inj 50%: INTRAMUSCULAR | Qty: 10 | Status: AC

## 2017-09-14 NOTE — Progress Notes (Signed)
[address]       [place] [address]             [phone]        CARDIOTHORACIC SURGERY PROGRESS NOTE   R1 Day Post-Op Procedure(s) (LRB): MINIMALLY INVASIVE MITRAL VALVE REPLACEMENT (MVR) with Medtronic Mosaic Porcine Heart Valve size 39mm (Right) TRANSESOPHAGEAL ECHOCARDIOGRAM (TEE) (N/A)  Subjective: Looks good.  Mild soreness.  Otherwise denies pain, SOB, nausea  Objective: Vital signs: BP Readings from Last 1 Encounters:  09/14/17 (!) 110/48   Pulse Readings from Last 1 Encounters:  09/14/17 89   Resp Readings from Last 1 Encounters:  09/14/17 13   Temp Readings from Last 1 Encounters:  09/14/17 99.5 F (37.5 C)    Hemodynamics: PAP: (24-57)/(9-27) 52/17 CO:  [1.7 L/min-5.6 L/min] 4.5 L/min CI:  [1 L/min/m2-3.4 L/min/m2] 2.7 L/min/m2  Physical Exam:  Rhythm:   Sinus brady - DDD pacing  Breath sounds: clear  Heart sounds:  RRR w/out murmur  Incisions:  Dressings dry, intact  Abdomen:  Soft, non-distended, non-tender  Extremities:  Warm, well-perfused  Chest tubes:  low volume thin serosanguinous output, no air leak    Intake/Output from previous day: 10/03 0701 - 10/04 0700 In: 6643.5 [I.V.:4563.5; Blood:530; IV Piggyback:1550] Out: 3149 [FWYOV:7858; Blood:1800; Chest Tube:490] Intake/Output this shift: No intake/output data recorded.  Lab Results:  CBC: Recent Labs  09/13/17 2325 09/14/17 0410  WBC 19.3* 18.0*  HGB 11.2* 10.4*  HCT 31.4* 29.8*  PLT 151 152    BMET:  Recent Labs  09/11/17 1607  09/13/17 2237 09/13/17 2325 09/14/17 0410  NA 134*  < > 141  --  140  K 3.7  < > 4.0  --  3.8  CL 98*  < > 105  --  107  CO2 25  --   --   --  25  GLUCOSE 119*  < > 114*  --  134*  BUN 16  < > 15  --  13  CREATININE 0.99  < > 0.80 0.97 0.99  CALCIUM 9.4  --   --   --  7.8*  < > = values in this interval not displayed.   PT/INR:   Recent Labs  09/13/17 1647  LABPROT 18.3*  INR 1.53    CBG (last 3)    Recent Labs  09/14/17 0243 09/14/17 0412 09/14/17 0517  GLUCAP 124* 139* 121*    ABG    Component Value Date/Time   PHART 7.353 09/14/2017 0414   PCO2ART 42.5 09/14/2017 0414   PO2ART 106.0 09/14/2017 0414   HCO3 23.6 09/14/2017 0414   TCO2 25 09/14/2017 0414   ACIDBASEDEF 2.0 09/14/2017 0414   O2SAT 98.0 09/14/2017 0414    CXR: PORTABLE CHEST 1 VIEW  COMPARISON:  Chest x-ray of September 13, 2017  FINDINGS: There has been interval extubation of the trachea and of the esophagus. The lungs are slightly less well inflated today. There is persistent increased density at the left lung base. There is no large pleural effusion and no pneumothorax. The heart is top-normal in size. The pulmonary vascularity is mildly prominent centrally. The Swan-Ganz catheter tip projects in the main pulmonary artery. The left internal jugular venous catheter tip projects over the junction of the proximal and midportions of the SVC. The right chest tube tip projects in the pulmonary apex. The left chest tube tip projects approximately 2 cm above the left hemidiaphragm.  IMPRESSION: Further interval improvement in the  appearance of the chest with interval extubation of the trachea and esophagus. Left retrocardiac density is consistent with atelectasis. No significant pleural effusion and no pneumothorax.   Electronically Signed   By: David  Martinique M.D.   On: 09/14/2017 07:38   Assessment/Plan: S/P Procedure(s) (LRB): MINIMALLY INVASIVE MITRAL VALVE REPLACEMENT (MVR) with Medtronic Mosaic Porcine Heart Valve size 3mm (Right) TRANSESOPHAGEAL ECHOCARDIOGRAM (TEE) (N/A)  Doing well POD1 Maintaining DDD paced rhythm w/ stable hemodynamics on milrinone 0.5 and dopamine 1 mcg/kg/min Breathing comfortably w/ O2 sats 99% on 4 L/min Expected post op acute blood loss anemia, Hgb 10.4 this morning Expected post op atelectasis, mild Chronic diastolic CHF with expected post-op volume  excess, weight reportedly 7 lbs > preop   Mobilize  Wean milrinone slowly  Wean dopamine off  Leave chest tubes in for now  Continue DDD pacing for now and hold beta blockers  Start Coumadin slowly   Rexene Alberts, MD 09/14/2017 8:30 AM

## 2017-09-14 NOTE — Progress Notes (Signed)
Anesthesiology Follow-up:  Awake and alert, neuro intact, mild sore throat and mild incisional pain.   AV sequential pacing with CHB underneath, otherwise hemodynamically stable on milrinone 0.3 mcg/kg/hr and dopamine at 2 mcg/kg/ kmin.  VS: T- 37.3 BP- 120/45 HR- 80 RR- 13 O2 Sat 99% on 4L O2 PA 50/17 CO/CI 4.5/2.7  K-3.8 Na- 140 BUN/Cr- 13/0.99 H/H- 10.4/29.8 platelets- 152,000  Extubated at 03:00 this morning.  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 Still pacer dependent, O/W doing well.  Patricia [name]

## 2017-09-14 NOTE — Progress Notes (Signed)
Patient ID: Patricia [name], female   DOB: [birth year], 66 y.o.   MRN: [id number] EVENING ROUNDS NOTE :     [address]       [place] [address]             [phone]                 1 Day Post-Op Procedure(s) (LRB): MINIMALLY INVASIVE MITRAL VALVE REPLACEMENT (MVR) with Medtronic Mosaic Porcine Heart Valve size 32mm (Right) TRANSESOPHAGEAL ECHOCARDIOGRAM (TEE) (N/A)  Total Length of Stay:  LOS: 1 day  BP (!) 117/48   Pulse 80   Temp 98.7 F (37.1 C) (Oral)   Resp (!) 28   Ht 5\' 6"  (1.676 m)   Wt 138 lb 10.7 oz (62.9 kg)   SpO2 93%   BMI 22.38 kg/m   .Intake/Output      10/03 0701 - 10/04 0700 10/04 0701 - 10/05 0700   P.O.  240   I.V. (mL/kg) 4563.5 (72.6) 142 (2.3)   Blood 530    IV Piggyback 1550    Total Intake(mL/kg) 6643.5 (105.6) 382 (6.1)   Urine (mL/kg/hr) 3625 (2.4) 325 (0.4)   Blood 1800    Chest Tube 490 260   Total Output 5915 585   Net +728.5 -203          . sodium chloride    . DOPamine Stopped (09/14/17 0800)  . lactated ringers 20 mL/hr at 09/13/17 1900  . lactated ringers    . milrinone 0.3 mcg/kg/min (09/14/17 0826)  . potassium chloride Stopped (09/14/17 1929)  . vancomycin       Lab Results  Component Value Date   WBC 23.7 (H) 09/14/2017   HGB 10.4 (L) 09/14/2017   HCT 30.9 (L) 09/14/2017   PLT 147 (L) 09/14/2017   GLUCOSE 174 (H) 09/14/2017   CHOL 183 07/07/2016   TRIG 153 (H) 07/07/2016   HDL 53 07/07/2016   LDLCALC 99 07/07/2016   ALT 22 09/11/2017   AST 29 09/11/2017   NA 137 09/14/2017   K 3.7 09/14/2017   CL 99 (L) 09/14/2017   CREATININE 1.23 (H) 09/14/2017   BUN 19 09/14/2017   CO2 25 09/14/2017   TSH 2.790 06/23/2017   INR 1.53 09/13/2017   HGBA1C 6.1 (H) 09/11/2017   Stable today, alert , has not walked yet  Grace Isaac MD  Beeper [phone] Office [phone] 09/14/2017 6:54 PM

## 2017-09-14 NOTE — Procedures (Signed)
Extubation Procedure Note  Patient Details:   Name: Patricia [name] DOB: 1951/04/16 MRN: [id number]   Airway Documentation:  Airway (Active)  Secured at (cm) 21 cm 09/14/2017  2:24 AM  Measured From Lips 09/14/2017  2:24 AM  Secured Location Right 09/14/2017  2:24 AM  Secured By Pink Tape 09/14/2017  2:24 AM  Site Condition Dry 09/14/2017  2:24 AM    Evaluation  O2 sats: stable throughout Complications: No apparent complications Patient did tolerate procedure well. Bilateral Breath Sounds: Rhonchi, Diminished   Yes  Charyl Dancer 09/14/2017, 3:03 AM     Pt was extubated per rapid wean protocol. Pt performed NIF -40 and VC 900 ml. Pt had positive cuff leak and was able to performed IS of 750. Pt placed on 4 L at this time.

## 2017-09-14 NOTE — Progress Notes (Addendum)
Made a third attempt at weaning, but patient too sleepy. Once on CPAP/PS ventilator continuously alarming low respirations/low volumes. She has to be constantly stimulated by RN to remain awake. Will have RT place back on full support, and reassess readiness to wean once more awake. Richarda Blade RN

## 2017-09-14 NOTE — Anesthesia Postprocedure Evaluation (Signed)
Anesthesia Post Note  Patient: LARUE DRAWDY  Procedure(s) Performed: MINIMALLY INVASIVE MITRAL VALVE REPLACEMENT (MVR) with Medtronic Mosaic Porcine Heart Valve size 27mm (Right Chest) TRANSESOPHAGEAL ECHOCARDIOGRAM (TEE) (N/A )     Patient location during evaluation: SICU Anesthesia Type: General Level of consciousness: awake, awake and alert and oriented Pain management: pain level controlled Vital Signs Assessment: post-procedure vital signs reviewed and stable Respiratory status: spontaneous breathing, nonlabored ventilation and respiratory function stable Cardiovascular status: blood pressure returned to baseline Postop Assessment: no headache Anesthetic complications: no    Last Vitals:  Vitals:   09/14/17 1700 09/14/17 1800  BP: (!) 119/45 (!) 117/48  Pulse: 80 80  Resp: 17 (!) 28  Temp:    SpO2: 93% 93%    Last Pain:  Vitals:   09/14/17 1656  TempSrc: Oral  PainSc:                  Ashritha Desrosiers COKER

## 2017-09-15 ENCOUNTER — Inpatient Hospital Stay (HOSPITAL_COMMUNITY): Payer: Medicare Other

## 2017-09-15 LAB — BASIC METABOLIC PANEL
Anion gap: 8 (ref 5–15)
BUN: 19 mg/dL (ref 6–20)
CO2: 25 mmol/L (ref 22–32)
Calcium: 7.7 mg/dL — ABNORMAL LOW (ref 8.9–10.3)
Chloride: 100 mmol/L — ABNORMAL LOW (ref 101–111)
Creatinine, Ser: 1.24 mg/dL — ABNORMAL HIGH (ref 0.44–1.00)
GFR calc Af Amer: 51 mL/min — ABNORMAL LOW (ref >60)
GFR calc non Af Amer: 44 mL/min — ABNORMAL LOW (ref >60)
Glucose, Bld: 122 mg/dL — ABNORMAL HIGH (ref 65–99)
Potassium: 3.8 mmol/L (ref 3.5–5.1)
Sodium: 133 mmol/L — ABNORMAL LOW (ref 135–145)

## 2017-09-15 LAB — BPAM RBC
Blood Product Expiration Date: 201810252359
Blood Product Expiration Date: 201810252359
Blood Product Expiration Date: 201810252359
Blood Product Expiration Date: 201810252359
Blood Product Expiration Date: 201810302359
Blood Product Expiration Date: 201810302359
ISSUE DATE / TIME: 201810031144
ISSUE DATE / TIME: 201810031144
ISSUE DATE / TIME: 201810040942
ISSUE DATE / TIME: 201810040942
Unit Type and Rh: 5100
Unit Type and Rh: 5100
Unit Type and Rh: 5100
Unit Type and Rh: 5100
Unit Type and Rh: 5100
Unit Type and Rh: 5100

## 2017-09-15 LAB — TYPE AND SCREEN
ABO/RH(D): O POS
Antibody Screen: NEGATIVE
Unit division: 0
Unit division: 0
Unit division: 0
Unit division: 0
Unit division: 0
Unit division: 0

## 2017-09-15 LAB — GLUCOSE, CAPILLARY
Glucose-Capillary: 111 mg/dL — ABNORMAL HIGH (ref 65–99)
Glucose-Capillary: 133 mg/dL — ABNORMAL HIGH (ref 65–99)
Glucose-Capillary: 155 mg/dL — ABNORMAL HIGH (ref 65–99)
Glucose-Capillary: 141 mg/dL — ABNORMAL HIGH (ref 65–99)
Glucose-Capillary: 149 mg/dL — ABNORMAL HIGH (ref 65–99)
Glucose-Capillary: 135 mg/dL — ABNORMAL HIGH (ref 65–99)

## 2017-09-15 LAB — CBC
HCT: 29.1 % — ABNORMAL LOW (ref 36.0–46.0)
Hemoglobin: 10.1 g/dL — ABNORMAL LOW (ref 12.0–15.0)
MCH: 30.3 pg (ref 26.0–34.0)
MCHC: 34.7 g/dL (ref 30.0–36.0)
MCV: 87.4 fL (ref 78.0–100.0)
Platelets: 141 10*3/uL — ABNORMAL LOW (ref 150–400)
RBC: 3.33 MIL/uL — ABNORMAL LOW (ref 3.87–5.11)
RDW: 15.6 % — ABNORMAL HIGH (ref 11.5–15.5)
WBC: 23 10*3/uL — ABNORMAL HIGH (ref 4.0–10.5)

## 2017-09-15 LAB — COOXEMETRY PANEL
Carboxyhemoglobin: 1.3 % (ref 0.5–1.5)
Methemoglobin: 1.1 % (ref 0.0–1.5)
O2 Saturation: 63.6 %
Total hemoglobin: 10 g/dL — ABNORMAL LOW (ref 12.0–16.0)

## 2017-09-15 LAB — PROTIME-INR
INR: 1.38
Prothrombin Time: 16.9 seconds — ABNORMAL HIGH (ref 11.4–15.2)

## 2017-09-15 MED ORDER — FUROSEMIDE 40 MG PO TABS
40.0000 mg | ORAL_TABLET | Freq: Two times a day (BID) | ORAL | Status: DC
  Administered 2017-09-16 – 2017-09-20 (×8): 40 mg via ORAL
  Filled 2017-09-15 (×6): qty 1
  Filled 2017-09-15: qty 2
  Filled 2017-09-15: qty 1

## 2017-09-15 MED ORDER — SODIUM CHLORIDE 0.9% FLUSH
3.0000 mL | Freq: Two times a day (BID) | INTRAVENOUS | Status: DC
  Administered 2017-09-16 – 2017-09-18 (×3): 3 mL via INTRAVENOUS

## 2017-09-15 MED ORDER — FUROSEMIDE 40 MG PO TABS: 40.0000 mg | ORAL_TABLET | Freq: Every day | ORAL | Status: DC

## 2017-09-15 MED ORDER — INFLUENZA VAC SPLIT HIGH-DOSE 0.5 ML IM SUSY
0.5000 mL | PREFILLED_SYRINGE | INTRAMUSCULAR | Status: AC
  Administered 2017-09-16: 0.5 mL via INTRAMUSCULAR
  Filled 2017-09-15: qty 0.5

## 2017-09-15 MED ORDER — FUROSEMIDE 10 MG/ML IJ SOLN
20.0000 mg | Freq: Two times a day (BID) | INTRAMUSCULAR | Status: AC
  Administered 2017-09-15 (×2): 20 mg via INTRAVENOUS
  Filled 2017-09-15 (×2): qty 2

## 2017-09-15 MED ORDER — CHLORHEXIDINE GLUCONATE CLOTH 2 % EX PADS
6.0000 | MEDICATED_PAD | Freq: Every day | CUTANEOUS | Status: DC
  Administered 2017-09-16 – 2017-09-17 (×2): 6 via TOPICAL

## 2017-09-15 MED ORDER — POTASSIUM CHLORIDE CRYS ER 20 MEQ PO TBCR
20.0000 meq | EXTENDED_RELEASE_TABLET | Freq: Once | ORAL | Status: AC
  Administered 2017-09-15: 20 meq via ORAL
  Filled 2017-09-15: qty 1

## 2017-09-15 MED ORDER — SODIUM CHLORIDE 0.9% FLUSH: 10.0000 mL | INTRAVENOUS | Status: DC | PRN

## 2017-09-15 MED ORDER — SODIUM CHLORIDE 0.9% FLUSH
10.0000 mL | Freq: Two times a day (BID) | INTRAVENOUS | Status: DC
  Administered 2017-09-16 – 2017-09-17 (×2): 10 mL

## 2017-09-15 MED ORDER — POTASSIUM CHLORIDE CRYS ER 20 MEQ PO TBCR
20.0000 meq | EXTENDED_RELEASE_TABLET | Freq: Two times a day (BID) | ORAL | Status: DC
  Administered 2017-09-16 – 2017-09-17 (×3): 20 meq via ORAL
  Filled 2017-09-15 (×3): qty 1

## 2017-09-15 MED ORDER — INSULIN ASPART 100 UNIT/ML ~~LOC~~ SOLN
0.0000 [IU] | Freq: Three times a day (TID) | SUBCUTANEOUS | Status: DC
  Administered 2017-09-15 – 2017-09-17 (×9): 2 [IU] via SUBCUTANEOUS

## 2017-09-15 MED ORDER — ASPIRIN EC 81 MG PO TBEC
81.0000 mg | DELAYED_RELEASE_TABLET | Freq: Every day | ORAL | Status: DC
  Administered 2017-09-16 – 2017-09-21 (×5): 81 mg via ORAL
  Filled 2017-09-15 (×6): qty 1

## 2017-09-15 MED ORDER — MOVING RIGHT ALONG BOOK
Freq: Once | Status: AC
  Administered 2017-09-15: 11:00:00
  Filled 2017-09-15: qty 1

## 2017-09-15 MED ORDER — SODIUM CHLORIDE 0.9 % IV SOLN: 250.0000 mL | INTRAVENOUS | Status: DC | PRN

## 2017-09-15 MED ORDER — SODIUM CHLORIDE 0.9% FLUSH: 3.0000 mL | INTRAVENOUS | Status: DC | PRN

## 2017-09-15 MED ORDER — ASPIRIN EC 81 MG PO TBEC
81.0000 mg | DELAYED_RELEASE_TABLET | Freq: Every day | ORAL | Status: DC
  Administered 2017-09-15: 81 mg via ORAL
  Filled 2017-09-15: qty 1

## 2017-09-15 NOTE — Progress Notes (Signed)
Patient back to bed and left sleeve/cordis DC'd per MD order and protocol, patient has a double lumen in her left IJ, dressing changed using sterile technique, patient on bedrest for 30 minutes, call bell in reach, VS stable, will continue to monitor.  Rowe Pavy, RN

## 2017-09-15 NOTE — Progress Notes (Signed)
Patient ambulated in hallway 50 ft, patient tolerated walk ok, patient felt lightheaded and stopped about 3 times, will walk patient again later, patient back up to chair, call bell in reach VS stable, will continue to monitor.

## 2017-09-15 NOTE — Discharge Summary (Signed)
Physician Discharge Summary       [address]       [place] [address]             [phone]    Patient ID: Patricia [name] MRN: [id number] DOB/AGE: 67-31-1952 67 y.o.  Admit date: 09/13/2017 Discharge date: 09/21/2017  Admission Diagnoses: 1. Severe mitral regurgitation 2. Mild mitral stenosis  Active Diagnoses:  1. HTN (hypertension) 2. Aortic valve regurgitation 3. Pulmonary HTN ([facility] [place] [facility]) 4. Atrial flutter ([place]) 5. Bleeding gastric ulcer 6. Breast cancer ([place]) 7. COPD (chronic obstructive pulmonary disease) ([place]) 8. GERD (gastroesophageal reflux disease) 9. Hodgkin's lymphoma ([place]) 10. Osteoporosis 11. Pancreatitis 12. Prediabetes 13. CHB  Procedure (s):   Minimally-Invasive Mitral Valve Replacement             Medtronic Mosaic porcine stented bioprosthetic tissue valve (size 25 mm, model # 310, serial # [id number]) by Dr. Roxy Manns on 09/13/2017.  Permanent pacemaker and left upper extremity venography by Dr. 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 The patient has been followed for the last several years by Dr. Percival Spanish with known history of mitral stenosis, mitral regurgitation, and aortic insufficiency. Over the past 3 years or so the patient reports progressive symptoms of exertional fatigue and shortness  of breath. Symptoms became considerably worse in January of this year at which time she presented with shortness of breath with minimal activity and occasionally at rest with severe orthopnea and episodes of PND. Echocardiogram performed at that time revealed normal left ventricular systolic function with moderate aortic insufficiency and what was felt to be severe mitral stenosis and moderate to severe mitral regurgitation. Medications were adjusted with some degree of symptomatic improvement. She underwent left and right heart catheterization on 06/27/2017 revealing no significant coronary artery disease. There was moderate (3+) mitral regurgitation, severe mitral stenosis with mean transvalvular gradient estimated 11 mmHg, and moderate pulmonary hypertension. The patient underwent transesophageal echocardiogram demonstrating only mild to moderate central aortic insufficiency with mild mitral stenosis and moderate to severe mitral regurgitation. The patient was referred for surgical consultation and has been evaluated previously by Dr. Cyndia Bent on 07/07/2017. The patient has been referred for second surgical opinion to consider the possibility of minimally invasive mitral valve repair or replacement.  The patient is married and lives locally in [place] with her husband who suffers from pancreatic cancer and is currently hospitalized on hospice care with very short life expectancy. The patient states that over the last several months she has been quite stable clinically since her medications were adjusted by Dr. Percival Spanish earlier in the year. She still gets short of breath with moderate low-level activity. She sleeps on 3 pillows at night and cannot lay flat in bed. She denies any resting shortness of breath. She has not had significant chest pain or lower extremity edema and she has had occasional dizzy spells and several syncopal episodes although none recently.  Patient has stage D severe symptomatic  primary mitral  regurgitation with mild to moderate aortic insufficiency. She presents with long-standing history of worsening symptoms of exertional shortness of breath, orthopnea, and PND consistent with chronic diastolic congestive heart failure, [place] Heart Association function class III. I have personally reviewed the patient's recent transesophageal echocardiogram and diagnostic cardiac catheterization. TEE reveals what appears to be primarily moderate restrictive disease involving the mitral valve (functional type IIIa) which I suspect is related to the patient's history of radiation therapy to the chest in the distant past. There is a broad central jet of mitral regurgitation which fills the left atrium. There may be some functional component related to annular dilatation which could wax and wane with degrees of fluid overload. There is only mild mitral stenosis. 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 The aortic insufficiency is mild (1+) and perhaps moderate (2+)at most. Dr. Roxy Manns agreed that mitral valve repair or replacement is indicated. Potential risks, benefits, and complications of the surgery were discussed with the patient and she agreed to proceed with surgery. She underwent a mitral valve replacement on 09/13/2017.  Brief Hospital Course:  The patient was extubated the morning of post operative day one surgery without difficulty. She remained afebrile and hemodynamically stable. She was initially paced. She had underlying, persistent complete heart block. She did require a permanent pacemaker and this was done on 09/19/2017. Patient was weaned off of Dopamine drip on 10/03. She remained on Milrinone drip for a few days post op and co ox's were monitored. Gordy Councilman, a line, and foley were removed early in the post operative course. Chest tubes remained in for a few days post op and after output decreased, were removed on 09/16/2017.  She was  started on Coumadin and her PT and INR were monitored closely. She is on 2.5 mg of Coumadin. Her latest PT and INR are 23 and 2.05. As a result and as discussed with Dr. Roxy Manns, will decrease Coumadin dose to 2 mg. She will have a PT and INR checked on Monday 09/25/2017.  She was volume over loaded and diuresed. She had ABL anemia. She did not require a post op transfusion. Last H and H was 10.3 and 31.9. She was weaned off the insulin drip.  Once she was tolerating a diet, home diabetic medicines were restarted.  The patient's glucose remained well controlled.The patient's HGA1C pre op was 6.1. The patient was felt surgically stable for transfer from the ICU to [facility]U for further convalescence on 09/20/2017. She continues to progress with cardiac rehab. She was ambulating on room air. She has been tolerating a diet and has had a bowel movement. Epicardial pacing wires were removed on 09/20/2017. She will require further diuresis at discharge. As discussed with Dr. Roxy Manns, will give Lasix 40 mg bid for one week. Patient will be instructed to weigh herself daily and if her weight continues to increase or she has worsening LE edema, she needs to resume Chlorthalidone 25 mg daily. Chest tube sutures will be removed the day of discharge. The patient is felt surgically stable for discharge today.   Latest Vital Signs: Blood pressure (!) 107/54, pulse (!) 109, temperature 98.6 F (37 C), temperature source Oral, resp. rate 17, height 5\' 6"  (1.676 m), weight 139 lb 14.4 oz (63.5 kg), SpO2 94 %.  Physical Exam: Cardiovascular: V paced, tachy Pulmonary: Clear to auscultation bilaterally; no rales, wheezes, or rhonchi. Abdomen: Soft, non tender, bowel sounds present. Extremities: Blateral lower extremity edema. Wounds: Clean and  dry.  No erythema or signs of infection.  Discharge Condition: Stable and discharged to home.  Recent laboratory studies:  Lab Results  Component Value Date   WBC 17.9 (H) 09/20/2017    HGB 10.3 (L) 09/20/2017   HCT 31.9 (L) 09/20/2017   MCV 90.4 09/20/2017   PLT 364 09/20/2017   Lab Results  Component Value Date   NA 134 (L) 09/20/2017   K 4.3 09/20/2017   CL 91 (L) 09/20/2017   CO2 32 09/20/2017   CREATININE 0.91 09/20/2017   GLUCOSE 117 (H) 09/20/2017    Diagnostic Studies:  Dg Chest Port 1 View  Result Date: 09/15/2017 CLINICAL DATA:  Status post mitral valve repair EXAM: PORTABLE CHEST 1 VIEW COMPARISON:  09/14/2017 FINDINGS: Cardiac shadow is stable. Changes consistent with the known history of mitral valve repair are noted. Swan-Ganz catheter has been removed. Left jugular sheath as well as left jugular central line remain in place. Right-sided chest tubes are noted in satisfactory position. No pneumothorax is noted. Minimal effusions are seen bilaterally. Mild bibasilar atelectatic changes are noted. IMPRESSION: Stable small effusions and bibasilar atelectatic changes. Tubes and lines as described. Electronically Signed   By: Inez Catalina M.D.   On: 09/15/2017 07:17    Discharge Medications: Allergies as of 09/21/2017      Reactions   Azithromycin Other (See Comments)   pancreatitis   Penicillins Anaphylaxis, Hives, Swelling, Other (See Comments)   PATIENT HAS HAD A PCN REACTION WITH IMMEDIATE RASH, FACIAL/TONGUE/THROAT SWELLING, SOB, OR LIGHTHEADEDNESS WITH HYPOTENSION:  #  #  #  YES  #  #  #   HAS PT DEVELOPED SEVERE RASH INVOLVING MUCUS MEMBRANES or SKIN NECROSIS: #  #  #  YES  #  #  #  Has patient had a PCN reaction that required hospitalization: No Has patient had a PCN reaction occurring within the last 10 years: No   Sulfa Antibiotics Hives, Rash   Cheese Nausea And Vomiting      Medication List    STOP taking these medications   amLODipine 2.5 MG tablet Commonly known as:  NORVASC   chlorthalidone 25 MG tablet Commonly known as:  HYGROTON     TAKE these medications   acetaminophen 325 MG tablet Commonly known as:  TYLENOL Take 2 tablets  (650 mg total) by mouth 2 (two) times daily as needed for moderate pain or headache. What changed:  medication strength  how much to take   amiodarone 200 MG tablet Commonly known as:  PACERONE Take 1 tablet (200 mg total) by mouth daily. What changed:  when to take this   aspirin EC 81 MG tablet Take 81 mg by mouth 3 (three) times a week.   CALCIUM PLUS VITAMIN D PO Take 2 tablets by mouth daily.   cetirizine 10 MG tablet Commonly known as:  ZYRTEC Take 10 mg by mouth daily as needed for allergies.   esomeprazole 40 MG capsule Commonly known as:  NEXIUM Take 40 mg by mouth daily before breakfast.   furosemide 40 MG tablet Commonly known as:  LASIX Take 1 tablet (40 mg total) by mouth 2 (two) times daily. For week then stop.   latanoprost 0.005 % ophthalmic solution Commonly known as:  XALATAN Place 1 drop into both eyes at bedtime.   lipase/protease/amylase 12000 units Cpep capsule Commonly known as:  CREON Take 1 capsule (12,000 Units total) by mouth 3 (three) times daily with meals. What changed:  when to take  this   metoprolol tartrate 50 MG tablet Commonly known as:  LOPRESSOR TAKE 1 TABLET (50 MG TOTAL) BY MOUTH 2 (TWO) TIMES DAILY.   multivitamin with minerals Tabs tablet Take 1 tablet by mouth daily. Centrum Silver.   ONETOUCH DELICA LANCETS 16X Misc TEST SUGAR ONCE A WEEK   ONETOUCH VERIO test strip Generic drug:  glucose blood CHECK SUGAR ONCE A WEEK   potassium chloride SA 20 MEQ tablet Commonly known as:  K-DUR,KLOR-CON Take 1 tablet (20 mEq total) by mouth daily. For one week.   traMADol 50 MG tablet Commonly known as:  ULTRAM Take 50 mg by mouth every 4-6 hours PRN severe pain.   warfarin 2 MG tablet Commonly known as:  COUMADIN Take 1 tablet (2 mg total) by mouth daily at 6 PM. Or as directed.      The patient has been discharged on:   1.Beta Blocker:  Yes [   ]                              No   [   ]                               If No, reason:  2.Ace Inhibitor/ARB: Yes [   ]                                     No  [    ]                                     If No, reason:  3.Statin:   Yes [   ]                  No  [   ]                  If No, reason:  4.[place]a:  Yes  [ x  ]                  No   [   ]                  If No, reason:  Follow Up Appointments: Follow-up Information    Rexene Alberts, MD. Go on 10/02/2017.   Specialty:  Cardiothoracic Surgery Why:  PA/LAT CXR to be taken be taken (at [facility] which is in the same building as Dr. Guy Sandifer office, on the ground floor) on 10/02/2017 at 2:00 pm;Appointment time is at 2:30 pm Contact information: [place] [address] [phone]        Susy Frizzle, MD. Call.   Specialty:  Family Medicine Why:  For an appointment regarding further surveillance of HGA1C 6.1 (pre diabetes). Contact information: [address] [place] [address] [phone]        Barrett, Evelene Croon, PA-C. Go on 10/05/2017.   Specialties:  Cardiology, Radiology Why:  Appointment time is at 8:30 am Contact information: [place] [address] [phone]        Thompson Grayer, MD. Go on 10/02/2017.   Specialty:  Cardiology Why:  Appointment time  is at 3:30 pm and is for pacemeaker wound check only. Also, you will be called to schedule a follow up visit with Dr. Rayann Heman for january Contact information: [place] [address] [phone]        Sherran Needs, NP. Go on 09/27/2017.   Specialties:  Nurse Practitioner, Cardiology Why:  Appointment time is at 1:30 pm Contact information: [place] [address] [phone]        [facility]. Go on 09/25/2017.   Why:  This is for an appointment to have PT and INR (on [facility] for mitral valve replacement, INR 2-2.5). Appointment time is at 3:30 pm Contact information: [address] [place], [place], [place] [address]           Signed: Lars Pinks MPA-C 09/21/2017, 8:19 AM

## 2017-09-15 NOTE — Care Management Note (Addendum)
Case Management Note  Patient Details  Name: Patricia [name] MRN: [id number] Date of Birth: 1951/08/07  Subjective/Objective:   From home, POD 2 MVR, conts on milrinone, co ox of 63.6, chest tubes, cont to diurese.  10/8 0920 - POD 5 MVR, she states her friend Santiago Glad will be staying with her 24/7, patient states she has a rolling walker at home also.  She states her spouse had [facility] in the past , her husband recently passed.  She remains in [facility], EPS to see for PPM placement, cont to diuresis, plan to transfer to SDU.                 Action/Plan: NCM will follow for dc needs.   Expected Discharge Date:                  Expected Discharge Plan:  Home/Self Care  In-House Referral:     Discharge planning Services  CM Consult  Post Acute Care Choice:    Choice offered to:     DME Arranged:    DME Agency:     HH Arranged:    HH Agency:     Status of Service:  In process, will continue to follow  If discussed at Long Length of Stay Meetings, dates discussed:    Additional Comments:  Zenon Mayo, RN 09/15/2017, 11:12 AM

## 2017-09-15 NOTE — Progress Notes (Addendum)
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 She is without specific complaints this am.  Objective: Vital signs in last 24 hours: Temp:  [98.3 F (36.8 C)-99.5 F (37.5 C)] 98.7 F (37.1 C) (10/05 0735) Pulse Rate:  [62-80] 62 (10/05 0800) Cardiac Rhythm: Normal sinus rhythm;Sinus bradycardia (10/05 0800) Resp:  [12-28] 15 (10/05 0800) BP: (100-125)/(43-52) 102/46 (10/05 0800) SpO2:  [92 %-100 %] 93 % (10/05 0800) Arterial Line BP: (85-128)/(45-57) 128/45 (10/04 0900) Weight:  [143 lb 8.3 oz (65.1 kg)] 143 lb 8.3 oz (65.1 kg) (10/05 0550)  Filed Weights   09/13/17 0634 09/14/17 0515 09/15/17 0550  Weight: 131 lb (59.4 kg) 138 lb 10.7 oz (62.9 kg) 143 lb 8.3 oz (65.1 kg)    Weight change: 4 lb 13.6 oz (2.2 kg)   Hemodynamic parameters for last 24 hours: PAP: (53)/(17) 53/17 CO:  [3.8 L/min] 3.8 L/min CI:  [2.3 L/min/m2] 2.3 L/min/m2  Intake/Output from previous day: 10/04 0701 - 10/05 0700 In: 1000.9 [P.O.:240; I.V.:460.9; IV Piggyback:300] Out: 1565 [Urine:1065; Chest Tube:500]  Intake/Output this shift: Total I/O In: 545.3 [I.V.:545.3] Out: -   Current Meds: Scheduled Meds: . acetaminophen  1,000 mg Oral Q6H  . aspirin EC  325 mg Oral Daily  . bisacodyl  10 mg Oral Daily   Or  . bisacodyl  10 mg Rectal Daily  . Chlorhexidine Gluconate Cloth  6 each Topical Daily  . docusate sodium  200 mg Oral Daily  . insulin aspart  0-24 Units Subcutaneous Q4H  . latanoprost  1 drop Both Eyes QHS  . metoCLOPramide (REGLAN) injection  10 mg Intravenous Q6H  . pantoprazole  40 mg Oral Daily  . sodium chloride flush  10-40  mL Intracatheter Q12H  . sodium chloride flush  3 mL Intravenous Q12H  . warfarin  2.5 mg Oral q1800  . Warfarin - Physician Dosing Inpatient   Does not apply q1800   Continuous Infusions: . sodium chloride    . DOPamine Stopped (09/14/17 0800)  . lactated ringers 20 mL/hr at 09/15/17 0800  . lactated ringers    . milrinone 0.3 mcg/kg/min (09/15/17 0800)   PRN Meds:.metoprolol tartrate, morphine injection, morphine injection, ondansetron (ZOFRAN) IV, oxyCODONE, sodium chloride flush, traMADol  CV-RRR, no murmur Pulmonary-Diminished at bases Abdomen-Soft, non tender, bowel sounds present Extremities-Mild LE edema. Hands with edema Lupita Dawn is clean and dry.  Lab Results: CBC: Recent Labs  09/14/17 1742 09/15/17 0404  WBC 23.7* 23.0*  HGB 10.4* 10.1*  HCT 30.9* 29.1*  PLT 147* 141*   BMET:  Recent Labs  09/14/17 0410 09/14/17 1723 09/14/17 1742 09/15/17 0404  NA 140 137  --  133*  K 3.8 3.7  --  3.8  CL 107 99*  --  100*  CO2 25  --   --  25  GLUCOSE 134* 174*  --  122*  BUN 13 19  --  19  CREATININE 0.99 1.10* 1.23* 1.24*  CALCIUM 7.8*  --   --  7.7*  CMET: Lab Results  Component Value Date   WBC 23.0 (H) 09/15/2017   HGB 10.1 (L) 09/15/2017   HCT 29.1 (L) 09/15/2017   PLT 141 (L) 09/15/2017   GLUCOSE 122 (H) 09/15/2017   CHOL 183 07/07/2016   TRIG 153 (H) 07/07/2016   HDL 53 07/07/2016   LDLCALC 99 07/07/2016   ALT 22 09/11/2017   AST 29 09/11/2017   NA 133 (L) 09/15/2017   K 3.8 09/15/2017   CL 100 (L) 09/15/2017   CREATININE 1.24 (H) 09/15/2017   BUN 19 09/15/2017   CO2 25 09/15/2017   TSH 2.790 06/23/2017   INR 1.38 09/15/2017   HGBA1C 6.1 (H) 09/11/2017      PT/INR:  Recent Labs  09/15/17 0404  LABPROT 16.9*  INR 1.38   Radiology: Dg Chest Port 1 View  Result Date: 09/15/2017 CLINICAL DATA:  Status post mitral valve repair EXAM: PORTABLE CHEST 1 VIEW COMPARISON:  09/14/2017 FINDINGS: Cardiac shadow is stable. Changes  consistent with the known history of mitral valve repair are noted. Swan-Ganz catheter has been removed. Left jugular sheath as well as left jugular central line remain in place. Right-sided chest tubes are noted in satisfactory position. No pneumothorax is noted. Minimal effusions are seen bilaterally. Mild bibasilar atelectatic changes are noted. IMPRESSION: Stable small effusions and bibasilar atelectatic changes. Tubes and lines as described. Electronically Signed   By: Inez Catalina M.D.   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 CV-On Milrinone drip and co ox this am is 63.6. Slowly weaning Milrinone drip. Check co ox in am. Off Doapmaine drip.On Coumadin. INR this am 1.38. Conduction system appears recovered and will defer whether to start a BB to Dr. Roxy Manns. Decrease ecasa to 81 mg as is on Coumadin. 2. Pulmonary-Chest tubes with 240 cc last 12 hours and 500 last 24 hours. Chest tubes to remain for now. On 2 liters of oxygen via [place]. Will wean to room air over next few days. CXR this am shows no pneumothorax, small bilateral pleural effusions and bibasilar atelectasis. Encourage incentive spirometer and flutter valve. 3. Volume overload-has been given 3 doses of Lasix 20 mg IV. Creatinine remains 1.24. Will give 2 more doses of 20 mg IV today. 4. ABL anemia-H and H stable 10.1 and 29.1 this am. 5. Supplement potassium 6. CBGs 129/111/133. Pre diabetes as pre op HGA1C 6.1. Will stop accu checks and SS on transfer to floor. She will need to follow up with her medical doctor after discharge.    ZIMMERMAN,DONIELLE M PA-C 09/15/2017 8:21 AM    I have seen and examined the patient and agree with the assessment and plan as outlined.  Overall doing well POD2.  High junctional rhythm w/ HR 60's under pacer - looks like  possible 3rd degree AV block although P waves remain inconsistent.  Continue to hold beta blockers and watch rhythm w/ pacer off.  Wean milrinone.  Mobilize.  Diuresis.  Possible transfer step-down tomorrow if rhythm stable.  Leave chest tubes in at least 1 more day until output decreases further.  Rexene Alberts, MD 09/15/2017 10:20 AM

## 2017-09-15 NOTE — Progress Notes (Signed)
Patient ambulated in hallway 160 ft, patient tolerated walk well, patient up to chair, call bell in reach, will continue to monitor.  Rowe Pavy, RN

## 2017-09-15 NOTE — Progress Notes (Signed)
Patient ambulated 253 ft. She made 3 stops because she felt weak. She denies feeling dizzy, lightheaded, or short of breath.

## 2017-09-16 ENCOUNTER — Inpatient Hospital Stay (HOSPITAL_COMMUNITY): Payer: Medicare Other

## 2017-09-16 LAB — CBC
HCT: 28.9 % — ABNORMAL LOW (ref 36.0–46.0)
Hemoglobin: 9.7 g/dL — ABNORMAL LOW (ref 12.0–15.0)
MCH: 29.5 pg (ref 26.0–34.0)
MCHC: 33.6 g/dL (ref 30.0–36.0)
MCV: 87.8 fL (ref 78.0–100.0)
Platelets: 168 10*3/uL (ref 150–400)
RBC: 3.29 MIL/uL — ABNORMAL LOW (ref 3.87–5.11)
RDW: 15.7 % — ABNORMAL HIGH (ref 11.5–15.5)
WBC: 21.9 10*3/uL — ABNORMAL HIGH (ref 4.0–10.5)

## 2017-09-16 LAB — GLUCOSE, CAPILLARY
Glucose-Capillary: 150 mg/dL — ABNORMAL HIGH (ref 65–99)
Glucose-Capillary: 131 mg/dL — ABNORMAL HIGH (ref 65–99)
Glucose-Capillary: 137 mg/dL — ABNORMAL HIGH (ref 65–99)
Glucose-Capillary: 139 mg/dL — ABNORMAL HIGH (ref 65–99)

## 2017-09-16 LAB — BASIC METABOLIC PANEL
Anion gap: 9 (ref 5–15)
BUN: 21 mg/dL — ABNORMAL HIGH (ref 6–20)
CO2: 27 mmol/L (ref 22–32)
Calcium: 8.2 mg/dL — ABNORMAL LOW (ref 8.9–10.3)
Chloride: 96 mmol/L — ABNORMAL LOW (ref 101–111)
Creatinine, Ser: 1.14 mg/dL — ABNORMAL HIGH (ref 0.44–1.00)
GFR calc Af Amer: 57 mL/min — ABNORMAL LOW (ref >60)
GFR calc non Af Amer: 49 mL/min — ABNORMAL LOW (ref >60)
Glucose, Bld: 116 mg/dL — ABNORMAL HIGH (ref 65–99)
Potassium: 3.7 mmol/L (ref 3.5–5.1)
Sodium: 132 mmol/L — ABNORMAL LOW (ref 135–145)

## 2017-09-16 LAB — COOXEMETRY PANEL
Carboxyhemoglobin: 1.1 % (ref 0.5–1.5)
Methemoglobin: 1.4 % (ref 0.0–1.5)
O2 Saturation: 75.1 %
Total hemoglobin: 10 g/dL — ABNORMAL LOW (ref 12.0–16.0)

## 2017-09-16 LAB — PROTIME-INR
INR: 1.63
Prothrombin Time: 19.2 seconds — ABNORMAL HIGH (ref 11.4–15.2)

## 2017-09-16 NOTE — Progress Notes (Signed)
Patient declined getting out of bed until her morning EKG is completed.

## 2017-09-16 NOTE — Progress Notes (Signed)
3 Days Post-Op Procedure(s) (LRB): MINIMALLY INVASIVE MITRAL VALVE REPLACEMENT (MVR) with Medtronic Mosaic Porcine Heart Valve size 60mm (Right) TRANSESOPHAGEAL ECHOCARDIOGRAM (TEE) (N/A) Subjective: Dissociated rhythm HR 60 A-v paced 80 for diuresis Walking Chest tubes with min output  Objective: Vital signs in last 24 hours: Temp:  [97.7 F (36.5 C)-98.7 F (37.1 C)] 97.7 F (36.5 C) (10/06 0336) Pulse Rate:  [63-81] 80 (10/06 0700) Cardiac Rhythm: A-V Sequential paced (10/06 0800) Resp:  [11-19] 12 (10/06 0700) BP: (107-130)/(41-55) 121/47 (10/06 0700) SpO2:  [89 %-98 %] 98 % (10/06 0700) Weight:  [145 lb 11.6 oz (66.1 kg)] 145 lb 11.6 oz (66.1 kg) (10/06 0700)  Hemodynamic parameters for last 24 hours:  stable  Intake/Output from previous day: 10/05 0701 - 10/06 0700 In: 1249.4 [P.O.:600; I.V.:649.4] Out: 965 [Urine:780; Chest Tube:185] Intake/Output this shift: No intake/output data recorded.       Exam    General- alert and comfortable   Lungs- clear without rales, wheezes   Cor- regular rate and rhythm, no murmur , gallop   Abdomen- soft, non-tender   Extremities - warm, non-tender, minimal edema   Neuro- oriented, appropriate, no focal weakness   Lab Results:  Recent Labs  09/15/17 0404 09/16/17 0436  WBC 23.0* 21.9*  HGB 10.1* 9.7*  HCT 29.1* 28.9*  PLT 141* 168   BMET:  Recent Labs  09/15/17 0404 09/16/17 0436  NA 133* 132*  K 3.8 3.7  CL 100* 96*  CO2 25 27  GLUCOSE 122* 116*  BUN 19 21*  CREATININE 1.24* 1.14*  CALCIUM 7.7* 8.2*    PT/INR:  Recent Labs  09/16/17 0436  LABPROT 19.2*  INR 1.63   ABG    Component Value Date/Time   PHART 7.353 09/14/2017 0414   HCO3 23.6 09/14/2017 0414   TCO2 24 09/14/2017 1723   ACIDBASEDEF 2.0 09/14/2017 0414   O2SAT 75.1 09/16/2017 0453   CBG (last 3)   Recent Labs  09/15/17 1618 09/15/17 1932 09/15/17 2117  GLUCAP 141* 149* 135*    Assessment/Plan: S/P Procedure(s)  (LRB): 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

## 2017-09-16 NOTE — Progress Notes (Signed)
CT surgery p.m. Rounds  Patient had excellent day without nausea Ambulating well A-V pacing with underlying dissociated nodal rhythm Plan transfer to stepdown tomorrow

## 2017-09-17 ENCOUNTER — Inpatient Hospital Stay (HOSPITAL_COMMUNITY): Payer: Medicare Other

## 2017-09-17 LAB — CBC
HCT: 30.2 % — ABNORMAL LOW (ref 36.0–46.0)
Hemoglobin: 10 g/dL — ABNORMAL LOW (ref 12.0–15.0)
MCH: 29.2 pg (ref 26.0–34.0)
MCHC: 33.1 g/dL (ref 30.0–36.0)
MCV: 88.3 fL (ref 78.0–100.0)
Platelets: 230 10*3/uL (ref 150–400)
RBC: 3.42 MIL/uL — ABNORMAL LOW (ref 3.87–5.11)
RDW: 15.6 % — ABNORMAL HIGH (ref 11.5–15.5)
WBC: 18.3 10*3/uL — ABNORMAL HIGH (ref 4.0–10.5)

## 2017-09-17 LAB — BASIC METABOLIC PANEL
Anion gap: 10 (ref 5–15)
BUN: 25 mg/dL — ABNORMAL HIGH (ref 6–20)
CO2: 27 mmol/L (ref 22–32)
Calcium: 8.7 mg/dL — ABNORMAL LOW (ref 8.9–10.3)
Chloride: 96 mmol/L — ABNORMAL LOW (ref 101–111)
Creatinine, Ser: 1.08 mg/dL — ABNORMAL HIGH (ref 0.44–1.00)
GFR calc Af Amer: >60 mL/min (ref >60)
GFR calc non Af Amer: 52 mL/min — ABNORMAL LOW (ref >60)
Glucose, Bld: 109 mg/dL — ABNORMAL HIGH (ref 65–99)
Potassium: 3.4 mmol/L — ABNORMAL LOW (ref 3.5–5.1)
Sodium: 133 mmol/L — ABNORMAL LOW (ref 135–145)

## 2017-09-17 LAB — GLUCOSE, CAPILLARY
Glucose-Capillary: 119 mg/dL — ABNORMAL HIGH (ref 65–99)
Glucose-Capillary: 149 mg/dL — ABNORMAL HIGH (ref 65–99)
Glucose-Capillary: 132 mg/dL — ABNORMAL HIGH (ref 65–99)
Glucose-Capillary: 138 mg/dL — ABNORMAL HIGH (ref 65–99)

## 2017-09-17 LAB — PROTIME-INR
INR: 1.78
Prothrombin Time: 20.6 seconds — ABNORMAL HIGH (ref 11.4–15.2)

## 2017-09-17 MED ORDER — POTASSIUM CHLORIDE CRYS ER 20 MEQ PO TBCR
40.0000 meq | EXTENDED_RELEASE_TABLET | Freq: Two times a day (BID) | ORAL | Status: DC
  Administered 2017-09-17 – 2017-09-18 (×3): 40 meq via ORAL
  Filled 2017-09-17 (×3): qty 2

## 2017-09-17 NOTE — Progress Notes (Signed)
4 Days Post-Op Procedure(s) (LRB): MINIMALLY INVASIVE MITRAL VALVE REPLACEMENT (MVR) with Medtronic Mosaic Porcine Heart Valve size 68mm (Right) TRANSESOPHAGEAL ECHOCARDIOGRAM (TEE) (N/A) Subjective: Feels good Rhythm now junctional 64- doesnot appear dissociated Will place at V-backup and tx to stepdown Cont diuresis  Objective: Vital signs in last 24 hours: Temp:  [98 F (36.7 C)-98.5 F (36.9 C)] 98.1 F (36.7 C) (10/07 0800) Pulse Rate:  [80-83] 83 (10/07 0800) Cardiac Rhythm: A-V Sequential paced (10/07 0800) Resp:  [11-25] 16 (10/07 0800) BP: (117-140)/(46-73) 117/55 (10/07 0800) SpO2:  [90 %-96 %] 93 % (10/07 0800) Weight:  [143 lb 15.4 oz (65.3 kg)] 143 lb 15.4 oz (65.3 kg) (10/07 0302)  Hemodynamic parameters for last 24 hours:    Intake/Output from previous day: 10/06 0701 - 10/07 0700 In: 650 [P.O.:650] Out: 1760 [Urine:1450; Chest Tube:310] Intake/Output this shift: Total I/O In: 160 [P.O.:150; I.V.:10] Out: 350 [Urine:350]       Exam    General- alert and comfortable   Lungs- clear without rales, wheezes   Cor- regular rate and rhythm, no murmur , gallop   Abdomen- soft, non-tender   Extremities - warm, non-tender, minimal edema   Neuro- oriented, appropriate, no focal weakness   Lab Results:  Recent Labs  09/16/17 0436 09/17/17 0242  WBC 21.9* 18.3*  HGB 9.7* 10.0*  HCT 28.9* 30.2*  PLT 168 230   BMET:  Recent Labs  09/16/17 0436 09/17/17 0242  NA 132* 133*  K 3.7 3.4*  CL 96* 96*  CO2 27 27  GLUCOSE 116* 109*  BUN 21* 25*  CREATININE 1.14* 1.08*  CALCIUM 8.2* 8.7*    PT/INR:  Recent Labs  09/17/17 0242  LABPROT 20.6*  INR 1.78   ABG    Component Value Date/Time   PHART 7.353 09/14/2017 0414   HCO3 23.6 09/14/2017 0414   TCO2 24 09/14/2017 1723   ACIDBASEDEF 2.0 09/14/2017 0414   O2SAT 75.1 09/16/2017 0453   CBG (last 3)   Recent Labs  09/16/17 1543 09/16/17 2111 09/17/17 0825  GLUCAP 137* 139* 119*     Assessment/Plan: S/P Procedure(s) (LRB): MINIMALLY INVASIVE MITRAL VALVE REPLACEMENT (MVR) with Medtronic Mosaic Porcine Heart Valve size 43mm (Right) TRANSESOPHAGEAL ECHOCARDIOGRAM (TEE) (N/A) Mobilize Diuresis Plan for transfer to step-down: see transfer orders v-backup pacer   LOS: 4 days    Tharon Aquas Trigt III 09/17/2017

## 2017-09-17 NOTE — Progress Notes (Signed)
CT surgery p.m. Rounds  Patient doing well on V- back up Heart rate maintaining 60-65 percent minute Ambulating without difficulty 1 episode of nausea probably related to oral potassium dosing

## 2017-09-18 DIAGNOSIS — Z953 Presence of xenogenic heart valve: Secondary | ICD-10-CM

## 2017-09-18 LAB — CBC
HCT: 30.1 % — ABNORMAL LOW (ref 36.0–46.0)
Hemoglobin: 10.1 g/dL — ABNORMAL LOW (ref 12.0–15.0)
MCH: 29.5 pg (ref 26.0–34.0)
MCHC: 33.6 g/dL (ref 30.0–36.0)
MCV: 88 fL (ref 78.0–100.0)
Platelets: 269 10*3/uL (ref 150–400)
RBC: 3.42 MIL/uL — ABNORMAL LOW (ref 3.87–5.11)
RDW: 15.3 % (ref 11.5–15.5)
WBC: 15.6 10*3/uL — ABNORMAL HIGH (ref 4.0–10.5)

## 2017-09-18 LAB — BASIC METABOLIC PANEL
Anion gap: 12 (ref 5–15)
BUN: 32 mg/dL — ABNORMAL HIGH (ref 6–20)
CO2: 27 mmol/L (ref 22–32)
Calcium: 8.8 mg/dL — ABNORMAL LOW (ref 8.9–10.3)
Chloride: 94 mmol/L — ABNORMAL LOW (ref 101–111)
Creatinine, Ser: 1.13 mg/dL — ABNORMAL HIGH (ref 0.44–1.00)
GFR calc Af Amer: 57 mL/min — ABNORMAL LOW (ref >60)
GFR calc non Af Amer: 50 mL/min — ABNORMAL LOW (ref >60)
Glucose, Bld: 132 mg/dL — ABNORMAL HIGH (ref 65–99)
Potassium: 3.9 mmol/L (ref 3.5–5.1)
Sodium: 133 mmol/L — ABNORMAL LOW (ref 135–145)

## 2017-09-18 LAB — SURGICAL PCR SCREEN
MRSA, PCR: NEGATIVE
Staphylococcus aureus: NEGATIVE

## 2017-09-18 LAB — PROTIME-INR
INR: 1.52
Prothrombin Time: 18.1 seconds — ABNORMAL HIGH (ref 11.4–15.2)

## 2017-09-18 MED ORDER — SODIUM CHLORIDE 0.9 % IV SOLN: INTRAVENOUS | Status: DC

## 2017-09-18 MED ORDER — CHLORHEXIDINE GLUCONATE 4 % EX LIQD
60.0000 mL | Freq: Once | CUTANEOUS | Status: AC
  Administered 2017-09-19: 4 via TOPICAL

## 2017-09-18 MED ORDER — GENTAMICIN SULFATE 40 MG/ML IJ SOLN
80.0000 mg | INTRAMUSCULAR | Status: AC
  Administered 2017-09-19: 80 mg

## 2017-09-18 MED ORDER — CHLORHEXIDINE GLUCONATE 4 % EX LIQD
60.0000 mL | Freq: Once | CUTANEOUS | Status: AC
  Administered 2017-09-19: 4 via TOPICAL
  Filled 2017-09-18: qty 15

## 2017-09-18 MED ORDER — VANCOMYCIN HCL IN DEXTROSE 1-5 GM/200ML-% IV SOLN
1000.0000 mg | INTRAVENOUS | Status: AC
  Administered 2017-09-19: 1000 mg via INTRAVENOUS

## 2017-09-18 NOTE — Progress Notes (Signed)
      [address]       [place], [address]             [phone]      Up in chair visiting with family  For pacemaker tomorrow  Remo Lipps C. Roxan Hockey, MD [facility] [phone]

## 2017-09-18 NOTE — Consult Note (Signed)
Cardiology Consultation:   Patient ID: Patricia [name]; [id number]; 04/29/1951   Admit date: 09/13/2017 Date of Consult: 09/18/2017  Primary Care Provider: Susy Frizzle, MD Primary Cardiologist: Dr. Percival [name]   Patient Profile:   Patricia [name] is a 66 y.o. 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 Caryl [name]), and most recently MS that progressed to severe and now s/p MVR (porcine) on 09/13/17 who is being seen today for the evaluation of CHB at the request of Patricia [name].  History of Present Illness:   [name] is s/p minimally invasive MVR (porcine) on 09/13/17, remains in CHB pacing at 40 this AM.  She denies any CP, or SOB, feels lightheaded when OOB (even when pacing at 6), this AM pacing at 40 she felt weak, lightheaded at rest and rate increased to 56 feeling better.  BP looks good, no nodal blocking agents, no pressors  Past Medical History:  Diagnosis Date  . Atrial flutter (HCC)    Ablated 1998 Dr. Caryl [name]  . Bleeding gastric ulcer    back in 2000  . Breast cancer ([place])   . COPD (chronic obstructive pulmonary disease) (HCC)    from radiation  . GERD (gastroesophageal reflux disease)   . Hodgkin's lymphoma ([place])    Treated with radiation and chemo  . Mitral regurgitation    pvc  . Osteoporosis    lumbar verterbral fracture, left ankle fracture, dexa 2015  . Pancreatitis   . Pneumonia   . Prediabetes   . 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 Ulcer     Past Surgical History:  Procedure Laterality Date  . BREAST SURGERY     breast cancer since 2005   left mastectomy  . CARDIAC ELECTROPHYSIOLOGY [place] AND ABLATION  1999  .  CATARACT EXTRACTION, BILATERAL    . COLONOSCOPY    . IR RADIOLOGY PERIPHERAL GUIDED IV START  08/03/2017  . IR US GUIDE VASC ACCESS RIGHT  08/03/2017  . LAPAROTOMY    . MASTECTOMY  2005   Left-axillary  . MITRAL VALVE REPAIR Right 09/13/2017   Procedure: MINIMALLY INVASIVE MITRAL VALVE REPLACEMENT (MVR) with Medtronic Mosaic Porcine Heart Valve size 75mm;  Surgeon: Patricia Alberts, MD;  Location: [facility];  Service: Open Heart Surgery;  Laterality: Right;  . ORIF ANKLE FRACTURE Left 03/21/2014   Procedure: LEFT ANKLE FRACTURE OPEN TREATMENT BILMALLEOLAR INCLUDES INTERNAL FIXATION ;  Surgeon: Renette Butters, MD;  Location: [place];  Service: Orthopedics;  Laterality: Left;  . RIGHT/LEFT HEART CATH AND CORONARY ANGIOGRAPHY N/A 06/27/2017   Procedure: Right/Left Heart Cath and Coronary Angiography;  Surgeon: Larey [name], MD;  Location: [place] CV LAB;  Service: Cardiovascular;  Laterality: N/A;  . 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 TEE WITHOUT CARDIOVERSION  06/12/2012   Procedure: TRANSESOPHAGEAL ECHOCARDIOGRAM (TEE);  Surgeon: Larey [name], MD;  Location: [place];  Service: Cardiovascular;  Laterality: N/A;  . TEE WITHOUT CARDIOVERSION N/A 06/29/2017   Procedure: TRANSESOPHAGEAL ECHOCARDIOGRAM (TEE);  Surgeon: Jerline Pain, MD;  Location: [facility] ENDOSCOPY;  Service: Cardiovascular;  Laterality: N/A;  . TEE WITHOUT CARDIOVERSION N/A 09/13/2017   Procedure: TRANSESOPHAGEAL ECHOCARDIOGRAM (TEE);  Surgeon: Patricia Alberts, MD;  Location: [place];  Service: Open Heart Surgery;  Laterality: N/A;  . TONSILLECTOMY AND ADENOIDECTOMY         Inpatient Medications: Scheduled Meds: . acetaminophen  1,000 mg Oral Q6H  . aspirin EC  81 mg Oral Daily  . bisacodyl  10 mg Oral Daily   Or  . bisacodyl  10 mg Rectal Daily  . docusate sodium  200 mg Oral Daily  . furosemide  40 mg Oral BID  . latanoprost  1 drop Both Eyes QHS  . pantoprazole  40 mg Oral Daily  . potassium  chloride  40 mEq Oral BID  . sodium chloride flush  3 mL Intravenous Q12H  . sodium chloride flush  3 mL Intravenous Q12H  . warfarin  2.5 mg Oral q1800  . Warfarin - Physician Dosing Inpatient   Does not apply q1800   Continuous Infusions: . sodium chloride     PRN Meds: sodium chloride, ondansetron (ZOFRAN) IV, oxyCODONE, sodium chloride flush, traMADol  Allergies:    Allergies  Allergen Reactions  . Azithromycin Other (See Comments)    pancreatitis  . Penicillins Anaphylaxis, Hives, Swelling and Other (See Comments)    PATIENT HAS HAD A PCN REACTION WITH IMMEDIATE RASH, FACIAL/TONGUE/THROAT SWELLING, SOB, OR LIGHTHEADEDNESS WITH HYPOTENSION:  #  #  #  YES  #  #  #   HAS PT DEVELOPED SEVERE RASH INVOLVING MUCUS MEMBRANES or SKIN NECROSIS: #  #  #  YES  #  #  #  Has patient had a PCN reaction that required hospitalization: No Has patient had a PCN reaction occurring within the last 10 years: No   . Sulfa Antibiotics Hives and Rash  . Cheese Nausea And Vomiting    Social History:   Social History   Social History  . Marital status: Widowed    Spouse name: N/A  . Number of children: N/A  . Years of education: N/A   Occupational History  . Teacher (retired)    Social History Main Topics  . Smoking status: Never Smoker  . Smokeless tobacco: Never Used  . Alcohol use No  . Drug use: No  . Sexual activity: Yes   Other Topics Concern  . Not on file   Social History Narrative   Lives at home with husband.    Family History:   Family History  Problem Relation Age of Onset  . Rheumatic fever Father        Died suddenly age 1  . Diabetes Father   . Cancer Father        colon  . Heart disease Father        rheumatic fever x 3  . Arthritis Maternal Grandmother   . Hypertension Maternal Grandmother   . Diabetes Paternal Grandmother   . Vision loss Paternal Grandmother        glaucoma     ROS:  Please see the history of present illness.  ROS  All other ROS  reviewed and negative.     Physical Exam/Data:   Vitals:   09/18/17 0500 09/18/17 0643 09/18/17 0749 09/18/17 0800  BP:  (!) 149/60  (!) 142/49  Pulse:  (!) 56  (!) 56  Resp:  17  16  Temp:   98.2 F (36.8 C)   TempSrc:   Oral   SpO2:  99%  100%  Weight: 141 lb 15.6 oz (64.4 kg)     Height:  Intake/Output Summary (Last 24 hours) at 09/18/17 0922 Last data filed at 09/18/17 0600  Gross per 24 hour  Intake              890 ml  Output              350 ml  Net              540 ml   Filed Weights   09/16/17 0700 09/17/17 0302 09/18/17 0500  Weight: 145 lb 11.6 oz (66.1 kg) 143 lb 15.4 oz (65.3 kg) 141 lb 15.6 oz (64.4 kg)   Body mass index is 22.92 kg/m.  General:  Well nourished, well developed, in no acute distress HEENT: normal Lymph: no adenopathy Neck: no JVD Endocrine:  No thryomegaly Vascular: No carotid bruits; FA pulses 2+ bilaterally without bruits  Cardiac:   RRR; no murmurs are appreciated, no gallops or rubs Lungs:  CTA b/l, no wheezing, rhonchi or rales  Abd: soft, nontender  Ext: no edema Musculoskeletal:  No deformities, BUE and BLE strength normal and equal Skin: warm and dry R chest/infra-axillary dressing is dry Neuro:  No gross focal abnormalities noted Psych:  Normal affect   EKG:  The EKG was personally reviewed and demonstrates:   09/11/17 SR 85bpm, PR 187ms, QRS 23ms 09/14/17 CHB, junctional escape rhythm, QRS 125ms, V paced beats 09/16/17 AV paced Telemetry:  Telemetry was personally reviewed and demonstrates:   CHB w/V pacing currently at 50, earlier this AM at 40 as well.  Relevant CV Studies:  09/13/17: inter-op TEE Result status: Final result   Left ventricle: Normal cavity size and wall thickness. The LV cavity measured 4.4 cm at end-diastole and 2.9 cm at end-systole in the transgastric short axis view at the mid-papillary level. There was normal wall thickness. There was normal LV systolic function with the ejection fraction  estimated at 60-65%. In the post-bypass exam, there was ventricular dyssynchrony consistent with ventricular pacing. The ejection fraction was estimated at 50-55%.  Septum: No Patent Foramen Ovale present.  Left atrium: Patent foramen ovale not present.  Left atrium: Cavity is mildly dilated. No spontaneous echo contrast.  Aortic valve: The valve is trileaflet. Moderate valve thickening present. Moderate valve calcification present. Mild to moderate regurgitation.  Pulmonic valve: Trace regurgitation.  Right ventricle: In the pre-bypass exam, the right ventricular cavity was of normal size with mildly reduced systolic function especially in the infundibulum. There was no interventricular septal flattening. In the post-bypass period, there was modearte RV systolic dysfunction. There was decreased contractility of the RV free wall, but no interventricular septal flattening.  Tricuspid valve: The tricuspid valve appeared appeared structurally normal with mild tricuspid insufficiency.  Mitral valve: There was marked thickening of the mitral leaflets with severe calcification at the base of the anterior leaflet. There was no mitral stenosis. The mean transmitral gradient was 4 mm hg. The mitral valve area by pressure halftime was 3.28 cm2. There was moderate to severe mitral regurgitation with a central and posteriorly directed jet which wrapped along the posterior left atrial wall. The vena contracta measured 0.65 cm2. There was blunted systolic flow in the left and right up      06/27/17: R/LHC  There is moderate (3+) mitral regurgitation. 1. No significant coronary disease.  2. Normal LV systolic function.  3. 3+ mitral regurgitation.  4. Severe mitral stenosis.  5. No more than mild aortic stenosis.  6. Mildly elevated PCWP with near-normal LVEDP.  7. Moderate pulmonary hypertension,  appears to be mixed pulmonary venous hypertension and pulmonary arterial hypertension (may be component of  reactive pulmonary arterial changes in the setting long-standing mitral valve disease).  8. Root shot not done to assess aortic insufficiency, will have TEE on Thursday.   Laboratory Data:  Chemistry Recent Labs Lab 09/16/17 0436 09/17/17 0242 09/18/17 0239  NA 132* 133* 133*  K 3.7 3.4* 3.9  CL 96* 96* 94*  CO2 27 27 27   GLUCOSE 116* 109* 132*  BUN 21* 25* 32*  CREATININE 1.14* 1.08* 1.13*  CALCIUM 8.2* 8.7* 8.8*  GFRNONAA 49* 52* 50*  GFRAA 57* >60 57*  ANIONGAP 9 10 12      Recent Labs Lab 09/11/17 1607  PROT 7.1  ALBUMIN 4.4  AST 29  ALT 22  ALKPHOS 62  BILITOT 0.6   Hematology Recent Labs Lab 09/16/17 0436 09/17/17 0242 09/18/17 0239  WBC 21.9* 18.3* 15.6*  RBC 3.29* 3.42* 3.42*  HGB 9.7* 10.0* 10.1*  HCT 28.9* 30.2* 30.1*  MCV 87.8 88.3 88.0  MCH 29.5 29.2 29.5  MCHC 33.6 33.1 33.6  RDW 15.7* 15.6* 15.3  PLT 168 230 269   Cardiac EnzymesNo results for input(s): TROPONINI in the last 168 hours. No results for input(s): TROPIPOC in the last 168 hours.  BNPNo results for input(s): BNP, PROBNP in the last 168 hours.  DDimer No results for input(s): DDIMER in the last 168 hours.  Radiology/Studies:  Dg Chest Port 1 View Result Date: 09/17/2017 CLINICAL DATA:  Followup mitral valve replacement. Chest tube removal. EXAM: PORTABLE CHEST 1 VIEW COMPARISON:  09/16/2017 FINDINGS: Right chest tube and left internal jugular central line have been removed. No pneumothorax on the right. Persistent bibasilar atelectasis. Small effusions. No worsening or new finding. IMPRESSION: No pneumothorax following right chest tube removal. Persistent mild basilar atelectasis and small effusions. Electronically Signed   By: Nelson Chimes M.D.   On: 09/17/2017 07:00    Assessment and Plan:   1. CHB persists s/p minimally invasive MVR (porcine) POD #5     Pacing this AM at 40     In d/w Dr .Patricia [name], MV annulus was heavily calcified and doubts likelihood of any recovery of AV  conduction     No nodal blocking agents on board     INR today 1.52     WBC down to 15.6, afebrile  Patient will need pacing, LVEF 50-55%, doubt availability on today's schedule, will look for next available Patient is s/p left mastectomy likely plan for right sided implant  Dr. Lovena Le to see       For questions or updates, please contact [facility] Please consult [email or web address] for contact info under Cardiology/STEMI.   Signed, Baldwin Jamaica, PA-C  09/18/2017 9:22 AM;  EP Attending  Patient seen and examined. Agree with the findings as noted above. The patient has developed CHB after MVR and will need PPM. She has some residual swelling after surgery which may have been worse with her heart block. She has a h/o atrial arrhythmias and is s/p remote ablation by Dr. Renaldo Reel 20 years ago. We will plan to proceed with DDD PM insertion tomorrow. Dr. Greggory Brandy in hospital. I have discussed the indications/risks/benefits/goals/expectations of the procedure and she wishes to proceed. With her h/o breast CA on the left and radiation, will plan to place the new device on the right side.  Mikle Bosworth.D.

## 2017-09-18 NOTE — Progress Notes (Signed)
      [address]       [place] [address]             [phone]        CARDIOTHORACIC SURGERY PROGRESS NOTE   R5 Days Post-Op Procedure(s) (LRB): MINIMALLY INVASIVE MITRAL VALVE REPLACEMENT (MVR) with Medtronic Mosaic Porcine Heart Valve size 18mm (Right) TRANSESOPHAGEAL ECHOCARDIOGRAM (TEE) (N/A)  Subjective: Looks and feels well  Objective: Vital signs: BP Readings from Last 1 Encounters:  09/18/17 (!) 142/49   Pulse Readings from Last 1 Encounters:  09/18/17 (!) 56   Resp Readings from Last 1 Encounters:  09/18/17 16   Temp Readings from Last 1 Encounters:  09/18/17 98.2 F (36.8 C) (Oral)    Hemodynamics:    Physical Exam:  Rhythm:   Sinus w/ 3rd degree AV block - HR 35-45  Breath sounds: clear  Heart sounds:  RRR w/out murmur  Incisions:  Dressings dry, intact  Abdomen:  Soft, non-distended, non-tender  Extremities:  Warm, well-perfused    Intake/Output from previous day: 10/07 0701 - 10/08 0700 In: 1050 [P.O.:1040; I.V.:10] Out: 700 [Urine:700] Intake/Output this shift: No intake/output data recorded.  Lab Results:  CBC: Recent Labs  09/17/17 0242 09/18/17 0239  WBC 18.3* 15.6*  HGB 10.0* 10.1*  HCT 30.2* 30.1*  PLT 230 269    BMET:  Recent Labs  09/17/17 0242 09/18/17 0239  NA 133* 133*  K 3.4* 3.9  CL 96* 94*  CO2 27 27  GLUCOSE 109* 132*  BUN 25* 32*  CREATININE 1.08* 1.13*  CALCIUM 8.7* 8.8*     PT/INR:   Recent Labs  09/18/17 0239  LABPROT 18.1*  INR 1.52    CBG (last 3)   Recent Labs  09/17/17 1208 09/17/17 1550 09/17/17 2108  GLUCAP 149* 132* 138*    ABG    Component Value Date/Time   PHART 7.353 09/14/2017 0414   PCO2ART 42.5 09/14/2017 0414   PO2ART 106.0 09/14/2017 0414   HCO3 23.6 09/14/2017 0414   TCO2 24 09/14/2017 1723   ACIDBASEDEF 2.0 09/14/2017 0414   O2SAT 75.1 09/16/2017 0453    CXR: n/a  Assessment/Plan: S/P Procedure(s) (LRB): MINIMALLY INVASIVE MITRAL  VALVE REPLACEMENT (MVR) with Medtronic Mosaic Porcine Heart Valve size 75mm (Right) TRANSESOPHAGEAL ECHOCARDIOGRAM (TEE) (N/A)  Overall doing well POD5 but remains in CHB with high escape, stable BP Expected post op acute blood loss anemia, mild, stable Chronic diastolic CHF with expected post-op volume excess, I/O's balanced yesterday, weight down 2 lbs but still 10 lbs > preop Expected post op atelectasis, mild Leukocytosis w/out fever, trending down, likely related to generalized inflammation from surgery   Continue VVI backup for now  Will ask EPS to see for PPM placement  Mobilize  Diuresis  Awaiting bed on 4E for transfer   Rexene Alberts, MD 09/18/2017 8:12 AM

## 2017-09-19 ENCOUNTER — Inpatient Hospital Stay (HOSPITAL_COMMUNITY): Payer: Medicare Other

## 2017-09-19 ENCOUNTER — Encounter (HOSPITAL_COMMUNITY)
Admission: RE | Disposition: A | Discharge status: Home / Self Care | Payer: Self-pay | Source: Ambulatory Visit | Attending: Thoracic Surgery (Cardiothoracic Vascular Surgery)

## 2017-09-19 ENCOUNTER — Encounter (HOSPITAL_COMMUNITY): Payer: Self-pay | Admitting: Internal Medicine

## 2017-09-19 DIAGNOSIS — I442 Atrioventricular block, complete: Secondary | ICD-10-CM

## 2017-09-19 DIAGNOSIS — Z950 Presence of cardiac pacemaker: Secondary | ICD-10-CM

## 2017-09-19 HISTORY — DX: Presence of cardiac pacemaker: Z95.0

## 2017-09-19 HISTORY — PX: PACEMAKER IMPLANT: EP1218

## 2017-09-19 LAB — CBC
HCT: 30.8 % — ABNORMAL LOW (ref 36.0–46.0)
Hemoglobin: 10.2 g/dL — ABNORMAL LOW (ref 12.0–15.0)
MCH: 29.4 pg (ref 26.0–34.0)
MCHC: 33.1 g/dL (ref 30.0–36.0)
MCV: 88.8 fL (ref 78.0–100.0)
Platelets: 332 10*3/uL (ref 150–400)
RBC: 3.47 MIL/uL — ABNORMAL LOW (ref 3.87–5.11)
RDW: 15.5 % (ref 11.5–15.5)
WBC: 15.6 10*3/uL — ABNORMAL HIGH (ref 4.0–10.5)

## 2017-09-19 LAB — BASIC METABOLIC PANEL
Anion gap: 12 (ref 5–15)
BUN: 31 mg/dL — ABNORMAL HIGH (ref 6–20)
CO2: 28 mmol/L (ref 22–32)
Calcium: 9 mg/dL (ref 8.9–10.3)
Chloride: 97 mmol/L — ABNORMAL LOW (ref 101–111)
Creatinine, Ser: 1.23 mg/dL — ABNORMAL HIGH (ref 0.44–1.00)
GFR calc Af Amer: 52 mL/min — ABNORMAL LOW (ref >60)
GFR calc non Af Amer: 45 mL/min — ABNORMAL LOW (ref >60)
Glucose, Bld: 127 mg/dL — ABNORMAL HIGH (ref 65–99)
Potassium: 5.2 mmol/L — ABNORMAL HIGH (ref 3.5–5.1)
Sodium: 137 mmol/L (ref 135–145)

## 2017-09-19 LAB — PROTIME-INR
INR: 1.58
Prothrombin Time: 18.7 seconds — ABNORMAL HIGH (ref 11.4–15.2)

## 2017-09-19 SURGERY — PACEMAKER IMPLANT

## 2017-09-19 MED ORDER — AMIODARONE HCL 200 MG PO TABS
200.0000 mg | ORAL_TABLET | Freq: Every day | ORAL | Status: DC
  Administered 2017-09-19 – 2017-09-21 (×3): 200 mg via ORAL
  Filled 2017-09-19 (×3): qty 1

## 2017-09-19 MED ORDER — VANCOMYCIN HCL IN DEXTROSE 1-5 GM/200ML-% IV SOLN
INTRAVENOUS | Status: AC
  Filled 2017-09-19: qty 200

## 2017-09-19 MED ORDER — GENTAMICIN SULFATE 40 MG/ML IJ SOLN
INTRAMUSCULAR | Status: AC
  Filled 2017-09-19: qty 2

## 2017-09-19 MED ORDER — HEPARIN (PORCINE) IN NACL 2-0.9 UNIT/ML-% IJ SOLN
INTRAMUSCULAR | Status: AC | PRN
  Administered 2017-09-19: 500 mL

## 2017-09-19 MED ORDER — FENTANYL CITRATE (PF) 100 MCG/2ML IJ SOLN
INTRAMUSCULAR | Status: AC
  Filled 2017-09-19: qty 2

## 2017-09-19 MED ORDER — CHLORHEXIDINE GLUCONATE 4 % EX LIQD
CUTANEOUS | Status: AC
  Administered 2017-09-19: 4 via TOPICAL
  Filled 2017-09-19: qty 15

## 2017-09-19 MED ORDER — METOPROLOL TARTRATE 25 MG PO TABS
25.0000 mg | ORAL_TABLET | Freq: Two times a day (BID) | ORAL | Status: DC
  Administered 2017-09-19: 25 mg via ORAL
  Filled 2017-09-19: qty 1

## 2017-09-19 MED ORDER — VANCOMYCIN HCL IN DEXTROSE 1-5 GM/200ML-% IV SOLN
1000.0000 mg | Freq: Two times a day (BID) | INTRAVENOUS | Status: AC
  Administered 2017-09-19: 1000 mg via INTRAVENOUS
  Filled 2017-09-19: qty 200

## 2017-09-19 MED ORDER — FENTANYL CITRATE (PF) 100 MCG/2ML IJ SOLN
INTRAMUSCULAR | Status: DC | PRN
  Administered 2017-09-19 (×3): 12.5 ug via INTRAVENOUS

## 2017-09-19 MED ORDER — SODIUM CHLORIDE 0.9 % IV SOLN: 250.0000 mL | INTRAVENOUS | Status: DC | PRN

## 2017-09-19 MED ORDER — SODIUM CHLORIDE 0.9 % IV SOLN: Freq: Once | INTRAVENOUS | Status: DC

## 2017-09-19 MED ORDER — MILRINONE LACTATE IN DEXTROSE 20-5 MG/100ML-% IV SOLN: 0.2000 ug/kg/min | INTRAVENOUS | Status: DC

## 2017-09-19 MED ORDER — MIDAZOLAM HCL 5 MG/5ML IJ SOLN
INTRAMUSCULAR | Status: DC | PRN
  Administered 2017-09-19 (×3): 1 mg via INTRAVENOUS

## 2017-09-19 MED ORDER — MIDAZOLAM HCL 5 MG/5ML IJ SOLN
INTRAMUSCULAR | Status: AC
  Filled 2017-09-19: qty 5

## 2017-09-19 MED ORDER — SODIUM CHLORIDE 0.9% FLUSH: 3.0000 mL | INTRAVENOUS | Status: DC | PRN

## 2017-09-19 MED ORDER — IOPAMIDOL (ISOVUE-370) INJECTION 76%
INTRAVENOUS | Status: AC
  Filled 2017-09-19: qty 50

## 2017-09-19 MED ORDER — IOPAMIDOL (ISOVUE-370) INJECTION 76%
INTRAVENOUS | Status: DC | PRN
  Administered 2017-09-19: 15 mL via INTRAVENOUS

## 2017-09-19 MED ORDER — SODIUM CHLORIDE 0.9% FLUSH
3.0000 mL | Freq: Two times a day (BID) | INTRAVENOUS | Status: DC
  Administered 2017-09-19 – 2017-09-21 (×4): 3 mL via INTRAVENOUS

## 2017-09-19 MED ORDER — ONDANSETRON HCL 4 MG/2ML IJ SOLN: 4.0000 mg | Freq: Four times a day (QID) | INTRAMUSCULAR | Status: DC | PRN

## 2017-09-19 MED ORDER — BUPIVACAINE HCL (PF) 0.25 % IJ SOLN
INTRAMUSCULAR | Status: DC | PRN
  Administered 2017-09-19: 40 mL

## 2017-09-19 MED ORDER — BUPIVACAINE HCL (PF) 0.25 % IJ SOLN
INTRAMUSCULAR | Status: AC
  Filled 2017-09-19: qty 60

## 2017-09-19 MED ORDER — ACETAMINOPHEN 325 MG PO TABS: 325.0000 mg | ORAL_TABLET | ORAL | Status: DC | PRN

## 2017-09-19 MED FILL — Heparin Sodium (Porcine) Inj 1000 Unit/ML: INTRAMUSCULAR | Qty: 30 | Status: AC

## 2017-09-19 MED FILL — Potassium Chloride Inj 2 mEq/ML: INTRAVENOUS | Qty: 40 | Status: AC

## 2017-09-19 MED FILL — Magnesium Sulfate Inj 50%: INTRAMUSCULAR | Qty: 10 | Status: AC

## 2017-09-19 SURGICAL SUPPLY — 8 items
CABLE SURGICAL S-101-97-12 (CABLE) ×1 IMPLANT
IPG PACE AZUR XT DR MRI W1DR01 (Pacemaker) IMPLANT
LEAD CAPSURE NOVUS 45CM (Lead) ×1 IMPLANT
LEAD CAPSURE NOVUS 5076-58CM (Lead) ×1 IMPLANT
PACE AZURE XT DR MRI W1DR01 (Pacemaker) ×2 IMPLANT
PAD DEFIB LIFELINK (PAD) ×1 IMPLANT
SHEATH CLASSIC 7F (SHEATH) ×2 IMPLANT
TRAY PACEMAKER INSERTION (PACKS) ×1 IMPLANT

## 2017-09-19 NOTE — Progress Notes (Signed)
Orthopedic Tech Progress Note Patient Details:  Patricia [name] Apr 27, 1951 [id number]  Ortho Devices Type of Ortho Device: Arm sling Ortho Device/Splint Location: Pt has arm sling on left arm at this time.    Kristopher Oppenheim 09/19/2017, 11:45 AM

## 2017-09-19 NOTE — H&P (View-Only) (Signed)
Electrophysiology Rounding Note  Patient Name: Patricia [name] Date of Encounter: 09/19/2017    Subjective   The patient is doing well today.  At this time, the patient denies chest pain, shortness of breath, or any new concerns.  Inpatient Medications    Scheduled Meds: . aspirin EC  81 mg Oral Daily  . bisacodyl  10 mg Oral Daily   Or  . bisacodyl  10 mg Rectal Daily  . docusate sodium  200 mg Oral Daily  . furosemide  40 mg Oral BID  . gentamicin irrigation  80 mg Irrigation On Call  . latanoprost  1 drop Both Eyes QHS  . pantoprazole  40 mg Oral Daily  . potassium chloride  40 mEq Oral BID  . sodium chloride flush  3 mL Intravenous Q12H  . sodium chloride flush  3 mL Intravenous Q12H  . warfarin  2.5 mg Oral q1800  . Warfarin - Physician Dosing Inpatient   Does not apply q1800   Continuous Infusions: . sodium chloride    . sodium chloride    . vancomycin     PRN Meds: sodium chloride, ondansetron (ZOFRAN) IV, oxyCODONE, sodium chloride flush, traMADol   Vital Signs    Vitals:   09/19/17 0351 09/19/17 0400 09/19/17 0500 09/19/17 0520  BP:  (!) 138/44  (!) 160/50  Pulse:  (!) 56  (!) 56  Resp:  19  20  Temp: 98.3 F (36.8 C)     TempSrc: Oral     SpO2:  95%  95%  Weight:   141 lb 1.5 oz (64 kg)   Height:        Intake/Output Summary (Last 24 hours) at 09/19/17 0731 Last data filed at 09/18/17 1800  Gross per 24 hour  Intake              240 ml  Output                0 ml  Net              240 ml   Filed Weights   09/17/17 0302 09/18/17 0500 09/19/17 0500  Weight: 143 lb 15.4 oz (65.3 kg) 141 lb 15.6 oz (64.4 kg) 141 lb 1.5 oz (64 kg)    Physical Exam    GEN- The patient is well appearing, alert and oriented x 3 today.   Head- normocephalic, atraumatic Eyes-  Sclera clear, conjunctiva pink Ears- hearing intact Oropharynx- clear Neck- supple Lungs- Clear to ausculation bilaterally, normal work of breathing Heart- Regular rate and rhythm  (paced) GI- soft, NT, ND, + BS Extremities- no clubbing, cyanosis, or edema Skin- no rash or lesion Psych- euthymic mood, full affect Neuro- strength and sensation are intact  Labs    CBC  Recent Labs  09/18/17 0239 09/19/17 0347  WBC 15.6* 15.6*  HGB 10.1* 10.2*  HCT 30.1* 30.8*  MCV 88.0 88.8  PLT 269 245   Basic Metabolic Panel  Recent Labs  09/18/17 0239 09/19/17 0347  NA 133* 137  K 3.9 5.2*  CL 94* 97*  CO2 27 28  GLUCOSE 132* 127*  BUN 32* 31*  CREATININE 1.13* 1.23*  CALCIUM 8.8* 9.0   Liver Function Tests No results for input(s): AST, ALT, ALKPHOS, BILITOT, PROT, ALBUMIN in the last 72 hours. No results for input(s): LIPASE, AMYLASE in the last 72 hours. Cardiac Enzymes No results for input(s): CKTOTAL, CKMB, CKMBINDEX, TROPONINI in the last 72 hours. BNP Invalid input(s):  POCBNP D-Dimer No results for input(s): DDIMER in the last 72 hours. Hemoglobin A1C No results for input(s): HGBA1C in the last 72 hours. Fasting Lipid Panel No results for input(s): CHOL, HDL, LDLCALC, TRIG, CHOLHDL, LDLDIRECT in the last 72 hours. Thyroid Function Tests No results for input(s): TSH, T4TOTAL, T3FREE, THYROIDAB in the last 72 hours.  Invalid input(s): FREET3  Telemetry    Sinus rhythm with AV block and V pacing (personally reviewed)    Assessment & Plan    1. Persistent complete heart block s/p MVR Discussed with Dr Roxy Manns. The patient has persistent AV block.  I would therefore recommend pacemaker implantation at this time.  Risks, benefits, alternatives to pacemaker implantation were discussed in detail with the patient today. The patient understands that the risks include but are not limited to bleeding, infection, pneumothorax, perforation, tamponade, vascular damage, renal failure, MI, stroke, death,  and lead dislodgement and wishes to proceed at this time.  She is interested in [employer].  Signed, Thompson Grayer, MD  09/19/2017, 7:31 AM

## 2017-09-19 NOTE — Interval H&P Note (Signed)
History and Physical Interval Note:  09/19/2017 7:37 AM  [name]  has presented today for surgery, with the diagnosis of hb  The various methods of treatment have been discussed with the patient and family. After consideration of risks, benefits and other options for treatment, the patient has consented to  Procedure(s): PACEMAKER IMPLANT (N/A) as a surgical intervention .  The patient's history has been reviewed, patient examined, no change in status, stable for surgery.  I have reviewed the patient's chart and labs.  Questions were answered to the patient's satisfaction.     Patricia [name]

## 2017-09-19 NOTE — Progress Notes (Signed)
Patient ID: Patricia [name], female   DOB: 1951/04/30, 67 y.o.   MRN: [id number] [facility] Evening Rounds  She is hemodynamically stable after PPM implant today for CHB. Sensing atrium and pacing ventricle at 110 sitting in chair. Metoprolol and amio resumed.   No other problems today. She feels better.

## 2017-09-19 NOTE — Progress Notes (Addendum)
On assessment RN noted some subcutaneous air medial to pacemaker incision site. Patient is in no respiratory distress at this time, RR 17, 95% on room air, and does not complain of shortness of breath or any distress. Stat portable CXR ordered for assessment. Findings confirmed with charge nurse, Laury Axon., RN. Will continue to monitor closely.   Paged Dr. Aundra Dubin to make him aware of assessment findings. MD acknowledged, no new orders at this time, just asked RN to make EP aware in the AM.

## 2017-09-19 NOTE — Progress Notes (Signed)
Patient had left mastectomy in 2005, Dr Rayann Heman said ok to but IV in left arm as ordered

## 2017-09-19 NOTE — Progress Notes (Signed)
Electrophysiology Rounding Note  Patient Name: Patricia [name] Date of Encounter: 09/19/2017    Subjective   The patient is doing well today.  At this time, the patient denies chest pain, shortness of breath, or any new concerns.  Inpatient Medications    Scheduled Meds: . aspirin EC  81 mg Oral Daily  . bisacodyl  10 mg Oral Daily   Or  . bisacodyl  10 mg Rectal Daily  . docusate sodium  200 mg Oral Daily  . furosemide  40 mg Oral BID  . gentamicin irrigation  80 mg Irrigation On Call  . latanoprost  1 drop Both Eyes QHS  . pantoprazole  40 mg Oral Daily  . potassium chloride  40 mEq Oral BID  . sodium chloride flush  3 mL Intravenous Q12H  . sodium chloride flush  3 mL Intravenous Q12H  . warfarin  2.5 mg Oral q1800  . Warfarin - Physician Dosing Inpatient   Does not apply q1800   Continuous Infusions: . sodium chloride    . sodium chloride    . vancomycin     PRN Meds: sodium chloride, ondansetron (ZOFRAN) IV, oxyCODONE, sodium chloride flush, traMADol   Vital Signs    Vitals:   09/19/17 0351 09/19/17 0400 09/19/17 0500 09/19/17 0520  BP:  (!) 138/44  (!) 160/50  Pulse:  (!) 56  (!) 56  Resp:  19  20  Temp: 98.3 F (36.8 C)     TempSrc: Oral     SpO2:  95%  95%  Weight:   141 lb 1.5 oz (64 kg)   Height:        Intake/Output Summary (Last 24 hours) at 09/19/17 0731 Last data filed at 09/18/17 1800  Gross per 24 hour  Intake              240 ml  Output                0 ml  Net              240 ml   Filed Weights   09/17/17 0302 09/18/17 0500 09/19/17 0500  Weight: 143 lb 15.4 oz (65.3 kg) 141 lb 15.6 oz (64.4 kg) 141 lb 1.5 oz (64 kg)    Physical Exam    GEN- The patient is well appearing, alert and oriented x 3 today.   Head- normocephalic, atraumatic Eyes-  Sclera clear, conjunctiva pink Ears- hearing intact Oropharynx- clear Neck- supple Lungs- Clear to ausculation bilaterally, normal work of breathing Heart- Regular rate and rhythm  (paced) GI- soft, NT, ND, + BS Extremities- no clubbing, cyanosis, or edema Skin- no rash or lesion Psych- euthymic mood, full affect Neuro- strength and sensation are intact  Labs    CBC  Recent Labs  09/18/17 0239 09/19/17 0347  WBC 15.6* 15.6*  HGB 10.1* 10.2*  HCT 30.1* 30.8*  MCV 88.0 88.8  PLT 269 458   Basic Metabolic Panel  Recent Labs  09/18/17 0239 09/19/17 0347  NA 133* 137  K 3.9 5.2*  CL 94* 97*  CO2 27 28  GLUCOSE 132* 127*  BUN 32* 31*  CREATININE 1.13* 1.23*  CALCIUM 8.8* 9.0   Liver Function Tests No results for input(s): AST, ALT, ALKPHOS, BILITOT, PROT, ALBUMIN in the last 72 hours. No results for input(s): LIPASE, AMYLASE in the last 72 hours. Cardiac Enzymes No results for input(s): CKTOTAL, CKMB, CKMBINDEX, TROPONINI in the last 72 hours. BNP Invalid input(s):  POCBNP D-Dimer No results for input(s): DDIMER in the last 72 hours. Hemoglobin A1C No results for input(s): HGBA1C in the last 72 hours. Fasting Lipid Panel No results for input(s): CHOL, HDL, LDLCALC, TRIG, CHOLHDL, LDLDIRECT in the last 72 hours. Thyroid Function Tests No results for input(s): TSH, T4TOTAL, T3FREE, THYROIDAB in the last 72 hours.  Invalid input(s): FREET3  Telemetry    Sinus rhythm with AV block and V pacing (personally reviewed)    Assessment & Plan    1. Persistent complete heart block s/p MVR Discussed with Dr Roxy Manns. The patient has persistent AV block.  I would therefore recommend pacemaker implantation at this time.  Risks, benefits, alternatives to pacemaker implantation were discussed in detail with the patient today. The patient understands that the risks include but are not limited to bleeding, infection, pneumothorax, perforation, tamponade, vascular damage, renal failure, MI, stroke, death,  and lead dislodgement and wishes to proceed at this time.  She is interested in [facility].  Signed, Thompson Grayer, MD  09/19/2017, 7:31 AM

## 2017-09-19 NOTE — Progress Notes (Signed)
      [address]       [place] [address]             [phone]     CARDIOTHORACIC SURGERY PROGRESS NOTE  Day of Surgery  S/P Procedure(s) (LRB): PACEMAKER IMPLANT (N/A)  Subjective: Doing well after pacer implant  Objective: Vital signs in last 24 hours: Temp:  [98 F (36.7 C)-98.5 F (36.9 C)] 98 F (36.7 C) (10/09 1539) Pulse Rate:  [0-242] 111 (10/09 1600) Cardiac Rhythm: Ventricular paced (10/09 1600) Resp:  [0-28] 22 (10/09 1600) BP: (109-166)/(43-89) 166/77 (10/09 1600) SpO2:  [0 %-100 %] 96 % (10/09 1600) Weight:  [141 lb 1.5 oz (64 kg)] 141 lb 1.5 oz (64 kg) (10/09 0500)  Physical Exam:  Rhythm:   Vpaced - HR 110  Breath sounds: clear  Heart sounds:  tachy  Incisions:  Clean and dry  Abdomen:  soft  Extremities:  warm   Intake/Output from previous day: 10/08 0701 - 10/09 0700 In: 240 [P.O.:240] Out: -  Intake/Output this shift: Total I/O In: 240 [P.O.:240] Out: 550 [Urine:550]  Lab Results:  Recent Labs  09/18/17 0239 09/19/17 0347  WBC 15.6* 15.6*  HGB 10.1* 10.2*  HCT 30.1* 30.8*  PLT 269 332   BMET:  Recent Labs  09/18/17 0239 09/19/17 0347  NA 133* 137  K 3.9 5.2*  CL 94* 97*  CO2 27 28  GLUCOSE 132* 127*  BUN 32* 31*  CREATININE 1.13* 1.23*  CALCIUM 8.8* 9.0    CBG (last 3)   Recent Labs  09/17/17 1208 09/17/17 1550 09/17/17 2108  GLUCAP 149* 132* 138*   PT/INR:   Recent Labs  09/19/17 0347  LABPROT 18.7*  INR 1.58    CXR:  N/A  Assessment/Plan: S/P Procedure(s) (LRB): PACEMAKER IMPLANT (N/A)  Overall doing well now s/p PPM placement for post-op complete heart block Now tachycardic w/ HR 110 Will resume amiodarone and metoprolol at reduced dosage Possible d/c home 1-2 days depending on rhythm  Rexene Alberts, MD 09/19/2017 4:48 PM

## 2017-09-20 ENCOUNTER — Inpatient Hospital Stay (HOSPITAL_COMMUNITY): Payer: Medicare Other

## 2017-09-20 LAB — BASIC METABOLIC PANEL
Anion gap: 11 (ref 5–15)
BUN: 20 mg/dL (ref 6–20)
CO2: 32 mmol/L (ref 22–32)
Calcium: 8.6 mg/dL — ABNORMAL LOW (ref 8.9–10.3)
Chloride: 91 mmol/L — ABNORMAL LOW (ref 101–111)
Creatinine, Ser: 0.91 mg/dL (ref 0.44–1.00)
GFR calc Af Amer: >60 mL/min (ref >60)
GFR calc non Af Amer: >60 mL/min (ref >60)
Glucose, Bld: 117 mg/dL — ABNORMAL HIGH (ref 65–99)
Potassium: 4.3 mmol/L (ref 3.5–5.1)
Sodium: 134 mmol/L — ABNORMAL LOW (ref 135–145)

## 2017-09-20 LAB — PROTIME-INR
INR: 1.72
Prothrombin Time: 20.1 seconds — ABNORMAL HIGH (ref 11.4–15.2)

## 2017-09-20 LAB — CBC
HCT: 31.9 % — ABNORMAL LOW (ref 36.0–46.0)
Hemoglobin: 10.3 g/dL — ABNORMAL LOW (ref 12.0–15.0)
MCH: 29.2 pg (ref 26.0–34.0)
MCHC: 32.3 g/dL (ref 30.0–36.0)
MCV: 90.4 fL (ref 78.0–100.0)
Platelets: 364 10*3/uL (ref 150–400)
RBC: 3.53 MIL/uL — ABNORMAL LOW (ref 3.87–5.11)
RDW: 15.6 % — ABNORMAL HIGH (ref 11.5–15.5)
WBC: 17.9 10*3/uL — ABNORMAL HIGH (ref 4.0–10.5)

## 2017-09-20 MED ORDER — FUROSEMIDE 40 MG PO TABS
40.0000 mg | ORAL_TABLET | Freq: Two times a day (BID) | ORAL | Status: DC
  Administered 2017-09-21: 40 mg via ORAL
  Filled 2017-09-20: qty 1

## 2017-09-20 MED ORDER — METOPROLOL TARTRATE 50 MG PO TABS
50.0000 mg | ORAL_TABLET | Freq: Two times a day (BID) | ORAL | Status: DC
  Administered 2017-09-20 – 2017-09-21 (×3): 50 mg via ORAL
  Filled 2017-09-20 (×3): qty 1

## 2017-09-20 MED ORDER — POTASSIUM CHLORIDE CRYS ER 20 MEQ PO TBCR
20.0000 meq | EXTENDED_RELEASE_TABLET | Freq: Every day | ORAL | Status: DC
  Administered 2017-09-20 – 2017-09-21 (×2): 20 meq via ORAL
  Filled 2017-09-20 (×2): qty 1

## 2017-09-20 MED ORDER — FUROSEMIDE 10 MG/ML IJ SOLN
40.0000 mg | Freq: Once | INTRAMUSCULAR | Status: AC
  Administered 2017-09-20: 40 mg via INTRAVENOUS
  Filled 2017-09-20: qty 4

## 2017-09-20 MED FILL — Gentamicin Sulfate Inj 40 MG/ML: INTRAMUSCULAR | Qty: 2 | Status: AC

## 2017-09-20 MED FILL — Sodium Chloride Irrigation Soln 0.9%: Qty: 500 | Status: AC

## 2017-09-20 NOTE — Progress Notes (Signed)
      [address]       [place] [address]             [phone]        CARDIOTHORACIC SURGERY PROGRESS NOTE   R1 Day Post-Op Procedure(s) (LRB): PACEMAKER IMPLANT (N/A)  Subjective: Looks and feels well.  Objective: Vital signs: BP Readings from Last 1 Encounters:  09/20/17 104/61   Pulse Readings from Last 1 Encounters:  09/20/17 (!) 113   Resp Readings from Last 1 Encounters:  09/20/17 (!) 21   Temp Readings from Last 1 Encounters:  09/20/17 (!) 97.5 F (36.4 C) (Oral)    Hemodynamics:    Physical Exam:  Rhythm:   Sinus tach, Vpacing  Breath sounds: Diminished at bases  Heart sounds:  RRR w/out murmur  Incisions:  Clean and dry  Abdomen:  Soft, non-distended, non-tender  Extremities:  Warm, well-perfused    Intake/Output from previous day: 10/09 0701 - 10/10 0700 In: 680 [P.O.:480; IV Piggyback:200] Out: 1050 [Urine:1050] Intake/Output this shift: No intake/output data recorded.  Lab Results:  CBC: Recent Labs  09/19/17 0347 09/20/17 0228  WBC 15.6* 17.9*  HGB 10.2* 10.3*  HCT 30.8* 31.9*  PLT 332 364    BMET:  Recent Labs  09/19/17 0347 09/20/17 0228  NA 137 134*  K 5.2* 4.3  CL 97* 91*  CO2 28 32  GLUCOSE 127* 117*  BUN 31* 20  CREATININE 1.23* 0.91  CALCIUM 9.0 8.6*     PT/INR:   Recent Labs  09/20/17 0228  LABPROT 20.1*  INR 1.72    CBG (last 3)   Recent Labs  09/17/17 1208 09/17/17 1550 09/17/17 2108  GLUCAP 149* 132* 138*    ABG    Component Value Date/Time   PHART 7.353 09/14/2017 0414   PCO2ART 42.5 09/14/2017 0414   PO2ART 106.0 09/14/2017 0414   HCO3 23.6 09/14/2017 0414   TCO2 24 09/14/2017 1723   ACIDBASEDEF 2.0 09/14/2017 0414   O2SAT 75.1 09/16/2017 0453    CXR: CHEST  2 VIEW  COMPARISON:  09/19/2017 and earlier.  FINDINGS: PA and lateral views. Stable left chest dual lead transvenous cardiac pacemaker. No pneumothorax. Moderate size right and small to moderate  left pleural effusions with associated bibasilar opacity. Pulmonary vascularity is within normal limits. Stable cardiac size and mediastinal contours. Visualized tracheal air column is within normal limits. Osteopenia. No acute osseous abnormality identified. Stable surgical clips in the visible abdomen. Negative visible bowel gas pattern.  IMPRESSION: 1. Moderate right and small to moderate left pleural effusions. 2. No other acute cardiopulmonary abnormality.   Electronically Signed   By: Genevie Ann M.D.   On: 09/20/2017 07:56   Assessment/Plan: S/P Procedure(s) (LRB): PACEMAKER IMPLANT (N/A)  Overall doing well Still tachycardic after resuming low dose amiodarone and metoprolol Chronic diastolic CHF with expected post-op volume excess, weight slowly trending down but still needs diuresis INR slowly trending up   Increase metoprolol back to preop dose  Continue amiodarone  Continue Coumadin at current dose  D/C pacing wires  Tentatively plan D/C home tomorrow  Still waiting for bed on 4E  Rexene Alberts, MD 09/20/2017 9:05 AM

## 2017-09-20 NOTE — Progress Notes (Signed)
Epicardial pacemaker wires removed.  Wires intact with no tissue present on end of wires.  Patient's vital signs stable throughout procedure.  Patient educated on procedure and post procedure bedrest instructions, patient verbalized understanding in her own words.  Patient on bedrest for 1 hour with vital signs every 15 minutes.

## 2017-09-20 NOTE — Discharge Instructions (Signed)
Supplemental Discharge Instructions for  Pacemaker/Defibrillator Patients  Activity No heavy lifting or vigorous activity with your left/right arm for 6 to 8 weeks.  Do not raise your left/right arm above your head for one week.  Gradually raise your affected arm as drawn below.             09/23/17                   09/24/17                  09/25/17                 09/26/17 __  NO DRIVING as per surgeon's recommendations.  WOUND CARE - Keep the wound area clean and dry.  Do not get this area wet, no showers until cleared to at your wound check visit - The tape/steri-strips on your wound will fall off; do not pull them off.  No bandage is needed on the site.  DO  NOT apply any creams, oils, or ointments to the wound area. - If you notice any drainage or discharge from the wound, any swelling or bruising at the site, or you develop a fever > 101? F after you are discharged home, call the office at once.  Special Instructions - You are still able to use cellular telephones; use the ear opposite the side where you have your pacemaker/defibrillator.  Avoid carrying your cellular phone near your device. - When traveling through airports, show security personnel your identification card to avoid being screened in the metal detectors.  Ask the security personnel to use the hand wand. - Avoid arc welding equipment, MRI testing (magnetic resonance imaging), TENS units (transcutaneous nerve stimulators).  Call the office for questions about other devices. - Avoid electrical appliances that are in poor condition or are not properly grounded. - Microwave ovens are safe to be near or to operate.  Additional information for defibrillator patients should your device go off: - If your device goes off ONCE and you feel fine afterward, notify the device clinic nurses. - If your device goes off ONCE and you do not feel well afterward, call 911. - If your device goes off TWICE, call 911. - If your  device goes off THREE times in one day, call 911.  DO NOT DRIVE YOURSELF OR A FAMILY MEMBER WITH A DEFIBRILLATOR TO THE HOSPITAL--CALL 911.   Prediabetes Eating Plan Prediabetes--also called impaired glucose tolerance or impaired fasting glucose--is a condition that causes blood sugar (blood glucose) levels to be higher than normal. Following a healthy diet can help to keep prediabetes under control. It can also help to lower the risk of type 2 diabetes and heart disease, which are increased in people who have prediabetes. Along with regular exercise, a healthy diet:  Promotes weight loss.  Helps to control blood sugar levels.  Helps to improve the way that the body uses insulin.  What do I need to know about this eating plan?  Use the glycemic index (GI) to plan your meals. The index tells you how quickly a food will raise your blood sugar. Choose low-GI foods. These foods take a longer time to raise blood sugar.  Pay close attention to the amount of carbohydrates in the food that you eat. Carbohydrates increase blood sugar levels.  Keep track of how many calories you take in. Eating the right amount of calories will help you to achieve a healthy weight. Losing about  7 percent of your starting weight can help to prevent type 2 diabetes.  You may want to follow a Mediterranean diet. This diet includes a lot of vegetables, lean meats or fish, whole grains, fruits, and healthy oils and fats. What foods can I eat? Grains Whole grains, such as whole-wheat or whole-grain breads, crackers, cereals, and pasta. Unsweetened oatmeal. Bulgur. Barley. Quinoa. Brown rice. Corn or whole-wheat flour tortillas or taco shells. Vegetables Lettuce. Spinach. Peas. Beets. Cauliflower. Cabbage. Broccoli. Carrots. Tomatoes. Squash. Eggplant. Herbs. Peppers. Onions. Cucumbers. Brussels sprouts. Fruits Berries. Bananas. Apples. Oranges. Grapes. Papaya. Mango. Pomegranate. Kiwi. Grapefruit. Cherries. Meats and  Other Protein Sources Seafood. Lean meats, such as chicken and [place] or lean cuts of pork and beef. Tofu. Eggs. Nuts. Beans. Dairy Low-fat or fat-free dairy products, such as yogurt, cottage cheese, and cheese. Beverages Water. Tea. Coffee. Sugar-free or diet soda. Seltzer water. Milk. Milk alternatives, such as soy or almond milk. Condiments Mustard. Relish. Low-fat, low-sugar ketchup. Low-fat, low-sugar barbecue sauce. Low-fat or fat-free mayonnaise. Sweets and Desserts Sugar-free or low-fat pudding. Sugar-free or low-fat ice cream and other frozen treats. Fats and Oils Avocado. Walnuts. Olive oil. The items listed above may not be a complete list of recommended foods or beverages. Contact your dietitian for more options. What foods are not recommended? Grains Refined white flour and flour products, such as bread, pasta, snack foods, and cereals. Beverages Sweetened drinks, such as sweet iced tea and soda. Sweets and Desserts Baked goods, such as cake, cupcakes, pastries, cookies, and cheesecake. The items listed above may not be a complete list of foods and beverages to avoid. Contact your dietitian for more information. This information is not intended to replace advice given to you by your health care provider. Make sure you discuss any questions you have with your health care provider. Document Released: 04/14/2015 Document Revised: 05/05/2016 Document Reviewed: 12/24/2014 Elsevier Interactive Patient Education  2017 [place] Need to Know About Warfarin Warfarin is a blood thinner (anticoagulant). Anticoagulants help to prevent the formation of blood clots. They also help to stop the growth of blood clots. Who should use warfarin? Warfarin is prescribed for people who are at risk for developing harmful blood clots, such as people who have:  Surgically implanted mechanical heart valves.  Irregular heart rhythms (atrial fibrillation).  Certain clotting  disorders.  A history of harmful blood clotting in the past. This includes people who have had: ? A stroke. ? Blood clot in the lungs (pulmonary embolism, or PE). ? Blood clot in the legs (deep vein thrombosis, or DVT).  An existing blood clot.  How is warfarin taken?  Warfarin is a medicine that you take by mouth (orally). Warfarin tablets come in different strengths. Each tablet strength is a different color, with the amount of warfarin printed on the tablet. If you get a new prescription filled and the color of your tablet is different than usual, tell your pharmacist or health care provider immediately. What blood tests do I need while taking warfarin? The goal of warfarin therapy is to lessen the clotting tendency of blood, but not to prevent clotting completely. Your health care provider will monitor the anticoagulation effect of warfarin closely and will adjust your dose as needed. Warfarin is a medicine that needs to be closely monitored, so it is very important to keep all lab visits and follow-up visits with your health care provider. While taking warfarin, you will need to have blood tests (prothrombin tests, or  PT tests) regularly to measure your blood clotting time. This type of test can be done with a finger stick or a blood draw. What does the INR test result mean? The PT test results will be reported as the International Normalized Ratio (INR). The INR tells your health care provider whether your dosage of warfarin needs to be changed. The longer it takes your blood to clot, the higher the INR. Your health care provider will tell you your target INR range. If your INR is not in your target range, your health care provider may adjust your dosage.  If your INR is above your target range, there is a risk of bleeding. Your dosage of warfarin may need to be decreased.  If your INR is below your target range, there is a risk of clotting. Your dosage of warfarin may need to be  increased.  How often is the INR test needed?  When you first start warfarin, you will usually have your INR checked every few days.  You may need to have INR tests done more than once a week until you are taking the correct dosage of warfarin.  After you have reached your target INR, your INR will be tested less often. However, you will need to have your INR checked at least once every 4-6 weeks for the entire time you are taking warfarin. What are the side effects of warfarin? Too much warfarin can cause bleeding (hemorrhage) in any part of the body, such as:  Bleeding from the gums.  Unexplained bruises.  Bruises that get larger.  Blood in the urine.  Bloody or dark stools.  Bleeding in the brain (hemorrhagic stroke).  A nosebleed that is not easily stopped.  Coughing up blood.  Vomiting blood.  Warfarin use may also cause:  Skin rash or irritations  Nausea that does not go away.  Severe pain in the back or joints.  Painful toes that turn blue or purple (purple toe syndrome).  Painful ulcers that do not go away (skin necrosis).  What are the signs and symptoms of a blood clot? Too little warfarin can increase the risk of blood clots in your legs, lungs, or arms. Signs and symptoms of a DVT in your leg or arm may include:  Pain or swelling in your leg or arm.  Skin that is red or warm to the touch on your arm or leg.  Signs and symptoms of a pulmonary embolism may include:  Shortness of breath or difficulty breathing.  Chest pain.  Unexplained fever.  What are the signs and symptoms of a stroke? If you are taking too much or too little warfarin, you can have a stroke. Signs and symptoms of a stroke may include:  Weakness or numbness of your face, arm, or leg, especially on one side of your body.  Confusion or trouble thinking clearly.  Difficulty seeing with one or both eyes.  Difficulty walking or moving your arms or legs.  Dizziness.  Loss of  balance or coordination.  Trouble speaking, trouble understanding speech, or both (aphasia).  Sudden, severe headache with no known cause.  Partial or total loss of consciousness.  What precautions do I need to take while using warfarin?   Take warfarin exactly as told by your health care provider. Doing this helps you avoid bleeding or blood clots that could result in serious injury, pain, or disability.  Take your medicine at the same time every day. If you forget to take your dose of  warfarin, take it as soon as you remember that day. If you do not remember on that day, do not take an extra dose the next day.  Contact your health care provider if you miss or take an extra dose. Do not change your dosage on your own to make up for missed or extra doses.  Wear or carry identification that says that you are taking warfarin.  Make sure that all health care providers, including your dentist, know you are taking warfarin.  If you need surgery, talk with your health care provider about whether you should stop taking warfarin before your surgery.  Avoid situations that cause bleeding. You may bleed more easily while taking warfarin. To limit bleeding, take the following actions: ? Use a softer toothbrush. ? Floss with waxed floss, not unwaxed floss. ? Shave with an electric razor, not with a blade. ? Limit your use of sharp objects. ? Avoid potentially harmful activities, such as contact sports. What do I need to know about warfarin and pregnancy or breastfeeding?  Warfarin is not recommended during the first trimester of pregnancy due to an increased risk of birth defects. In certain situations, a woman may take warfarin after her first trimester of pregnancy.  If you are taking warfarin and you become pregnant or plan to become pregnant, contact your health care provider right away.  If you plan to breastfeed while taking warfarin, talk with your health care provider first. What do I  need to know about warfarin and alcohol or drug use?  Avoid drinking alcohol, or limit alcohol intake to no more than 1 drink a day for nonpregnant women and 2 drinks a day for men. One drink equals 12 oz of beer, 5 oz of wine, or 1 oz of hard liquor. ? If you change the amount of alcohol that you drink, tell your health care provider. Your warfarin dosage may need to be changed.    Avoid street drugs while taking warfarin. The effects of street drugs on warfarin are not known. What do I need to know about warfarin and other medicines or supplements?  Many prescription and over-the-counter medicines can interfere with warfarin. Talk with your health care provider or your pharmacist before starting or stopping any new medicines. This includes over-the-counter vitamins, dietary supplements, herbal medicines, and pain medicines. Your warfarin dosage may need to be adjusted.  Some common over-the-counter medicines that may increase the risk of bleeding while taking warfarin include: ? Acetaminophen. ?  ? NSAIDs, such as ibuprofen or naproxen. ? Vitamin E. What do I need to know about warfarin and my diet?  It is important to maintain a normal, balanced diet while taking warfarin. Avoid major changes in your diet. If you are going to change your diet, talk with your health care provider before making changes.  Your health care provider may recommend that you work with a diet and nutrition specialist (dietitian).  Vitamin K decreases the effect of warfarin, and it is found in many foods. Eat a consistent amount of foods that contain vitamin K. For example, you may decide to eat 2 vitamin K-containing foods each day. Most foods that are high in vitamin K are green and leafy. Common foods that contain high amounts of vitamin K include:  Kale, raw or cooked.  Spinach, raw or cooked.  Collards, raw or cooked.  Swiss chard, raw or cooked.  Mustard greens, raw or cooked.  Turnip greens, raw  or cooked.  Parsley, raw.  Broccoli,  cooked.  Noodles, eggs, and spinach, enriched.  Brussels sprouts, raw or cooked.  Beet greens, raw or cooked.  Endive, raw.  Cabbage, cooked.  Asparagus, cooked.  Foods that contain moderate amounts of vitamin K include:  Broccoli, raw.  Cabbage, raw.  Bok choy, cooked.  Green leaf lettuce, raw  Prunes, stewed.  Angie Fava.  Kiwi.  Edamame, cooked.  Romaine lettuce, raw.  Avocado.  Tuna, canned in oil.  Okra, cooked.  Black-eyed peas, cooked.  Green beans, cooked or raw.  Blueberries, raw.  Blackberries, raw.  Peas, cooked or raw.  Contact a health care provider if:  You miss a dose.  You take an extra dose.  You plan to have any kind of surgery or procedure.  You are unable to take your medicine due to nausea, vomiting, or diarrhea.  You have any major changes in your diet or you plan to make any major changes in your diet.  You start or stop any over-the-counter medicine, prescription medicine, or dietary supplement.  You become pregnant, plan to become pregnant, or think you may be pregnant.  You have menstrual periods that are heavier than usual.  You have unusual bruising. Get help right away if:  You develop symptoms of an allergic reaction, such as: ? Swelling of the lips, face, tongue, mouth, or throat. ? Rash. ? Itching. ? Itchy, red, swollen areas of skin (hives). ? Trouble breathing. ? Chest tightness.  You have: ? Signs or symptoms of a stroke. ? Signs or symptoms of a blood clot. ? A fall or have an accident, especially if you hit your head. ? Blood in your urine. Your urine may look reddish, pinkish, or tea-colored. ? Blood in your stool. Your stool may be black or bright red. ? Bleeding that does not stop after applying pressure to the area for 30 minutes. ? Severe pain in your joints or back. ? Purple or blue toes. ? Skin ulcers that do not go away.  You vomit blood or cough  up blood. The blood may be bright red, or it may look like coffee grounds. These symptoms may represent a serious problem that is an emergency. Do not wait to see if the symptoms will go away. Get medical help right away. Call your local emergency services (911 in the [place] Do not drive yourself to the hospital. Summary  Warfarin needs to be closely monitored with blood tests. It is very important to keep all lab visits and follow-up visits with your health care provider.  Make sure that you know your target INR range and your warfarin dosage.  Wear or carry identification that says that you are taking warfarin.  Take warfarin at the same time every day. Call your health care provider if you miss a dose or if you take an extra dose. Do not change the dosage of warfarin on your own.  Know the signs and symptoms of blood clots, bleeding, and a stroke. Know when to get emergency medical help.  Tell all health care providers who care for you that you are taking warfarin.  Talk with your health care provider or your pharmacist before starting or stopping any new medicines.  Monitor how much vitamin K you eat every day. Try to eat the same amount every day. This information is not intended to replace advice given to you by your health care provider. Make sure you discuss any questions you have with your health care provider. Document Released: 11/28/2005 Document Revised: 08/09/2016 Document  Reviewed: 02/24/2016 Elsevier Interactive Patient Education  2017 [employer]  Mitral Valve Replacement, Care After This sheet gives you information about how to care for yourself after your procedure. Your health care provider may also give you more specific instructions. If you have problems or questions, contact your health care provider. What can I expect after the procedure? After the procedure, it is common to have:  Pain at the incision area that may last for several weeks.  Follow these  instructions at home: Incision care  Follow instructions from your health care provider about how to take care of your incision. Make sure you: ? Wash your hands with soap and water before you change your bandage (dressing). If soap and water are not available, use hand sanitizer. ? Change your dressing as told by your health care provider. ? Leave stitches (sutures), skin glue, or adhesive strips in place. These skin closures may need to stay in place for 2 weeks or longer. If adhesive strip edges start to loosen and curl up, you may trim the loose edges. Do not remove adhesive strips completely unless your health care provider tells you to do that.  Check your incision area every day for signs of infection. Check for: ? More redness, swelling, or pain. ? More fluid or blood. ? Warmth. ? Pus or a bad smell.  Do not apply powder or lotion to the area. Driving  Do not drive until your health care provider approves.  Do not drive or use heavy machinery while taking prescription pain medicines. Bathing  Do not take baths, swim, or use a hot tub for 2-4 weeks after surgery, or until your health care provider approves. Ask your health care provider if you may take showers.  To wash the incision site, gently wash with soap and water and pat the area dry with a clean towel. Do not rub the incision area. That may cause bleeding. Activity  Rest as told by your health care provider. Ask your health care provider when you can resume normal activities, including sexual activity.  Avoid the following activities for 6-8 weeks, or as long as directed: ? Lifting anything that is heavier than 10 lb (4.5 kg), or the limit that your health care provider tells you. ? Pushing or pulling things with your arms.  Avoid climbing stairs and using the handrail to pull yourself up for the first 2-3 weeks after surgery.  Avoid airplane travel for 4-6 weeks, or as long as directed.  Avoid sitting for long  periods of time and crossing your legs. Get up and move around at least once every 1-2 hours.  If you are taking blood thinners (anticoagulants), avoid activities that have a high risk of injury. Ask your health care provider what activities are safe for you. Lifestyle  Limit alcohol intake to no more than 1 drink a day for nonpregnant women and 2 drinks a day for men. One drink equals 12 oz of beer, 5 oz of wine, or 1 oz of hard liquor.  Do not use any products that contain nicotine or tobacco, such as cigarettes and e-cigarettes. If you need help quitting, ask your health care provider. General instructions  Take your temperature every day and weigh yourself every morning for the first 7 days after surgery. Write your temperatures and weight down and take this record with you to any follow-up visits.  Take over-the-counter and prescription medicines only as told by your health care provider.  To prevent or treat  constipation while you are taking prescription pain medicine, your health care provider may recommend that you: ? Drink enough fluid to keep your urine clear or pale yellow. ? Take over-the-counter or prescription medicines. ? Eat foods that are high in fiber, such as fresh fruits and vegetables, whole grains, and beans. ? Limit foods that are high in fat and processed sugars, such as fried and sweet foods.  Follow instructions from your health care provider about eating or drinking restrictions.  Wear compression stockings for at least 2 weeks, or as long as told by your health care provider. These stockings help to prevent blood clots and reduce swelling in your legs. If your ankles are swollen after 2 weeks, continue to wear the stockings.  Keep all follow-up visits as told by your health care provider. This is important. Contact a health care provider if:  You develop a skin rash.  Your weight is increasing each day over 2-3 days.  You gain 2 lb (1 kg) or more in a  single day.  You have a fever. Get help right away if:  You develop chest pain that feels different from the pain caused by your incision.  You develop shortness of breath or difficulty breathing.  You have more redness, swelling, or pain around your incision.  You have more fluid or blood coming from your incision.  Your incision feels warm to the touch.  You have pus or a bad smell coming from your incision.  You feel light-headed. This information is not intended to replace advice given to you by your health care provider. Make sure you discuss any questions you have with your health care provider. Document Released: 06/17/2005 Document Revised: 09/09/2016 Document Reviewed: 09/09/2016 [employer] Interactive Patient Education  [employer].

## 2017-09-20 NOTE — Progress Notes (Addendum)
Progress Note  Patient Name: Patricia [name] Date of Encounter: 09/20/2017  Primary Cardiologist: Dr. Percival Spanish  Subjective   No complaints today, denies CP or SOB, denies discomfort at pacer site  Inpatient Medications    Scheduled Meds: . amiodarone  200 mg Oral Daily  . aspirin EC  81 mg Oral Daily  . bisacodyl  10 mg Oral Daily   Or  . bisacodyl  10 mg Rectal Daily  . docusate sodium  200 mg Oral Daily  . furosemide  40 mg Oral BID  . latanoprost  1 drop Both Eyes QHS  . metoprolol tartrate  25 mg Oral BID  . pantoprazole  40 mg Oral Daily  . sodium chloride flush  3 mL Intravenous Q12H  . warfarin  2.5 mg Oral q1800  . Warfarin - Physician Dosing Inpatient   Does not apply q1800   Continuous Infusions: . sodium chloride    . sodium chloride     PRN Meds: sodium chloride, acetaminophen, ondansetron (ZOFRAN) IV, oxyCODONE, sodium chloride flush, traMADol   Vital Signs    Vitals:   09/20/17 0400 09/20/17 0500 09/20/17 0600 09/20/17 0700  BP: (!) 115/56 105/61 (!) 116/54 104/61  Pulse: (!) 104 (!) 104 (!) 109 (!) 113  Resp: 16 12 14  (!) 21  Temp: 97.7 F (36.5 C)   (!) 97.5 F (36.4 C)  TempSrc: Oral   Oral  SpO2: 97% 97% 96% 92%  Weight:   141 lb 12.1 oz (64.3 kg)   Height:        Intake/Output Summary (Last 24 hours) at 09/20/17 0826 Last data filed at 09/19/17 1901  Gross per 24 hour  Intake              680 ml  Output             1050 ml  Net             -370 ml   Filed Weights   09/18/17 0500 09/19/17 0500 09/20/17 0600  Weight: 141 lb 15.6 oz (64.4 kg) 141 lb 1.5 oz (64 kg) 141 lb 12.1 oz (64.3 kg)    Telemetry    ST 100-115 w/Vpaced rhythm - Personally Reviewed  ECG    ST 114bpm, V paced - Personally Reviewed  Physical Exam   GEN: No acute distress, OOB to chair   Neck: No JVD Cardiac: RRR, no murmurs, rubs, or gallops.  Respiratory: CTA b/l. GI: Soft, nontender, non-distended  MS: trace-1+ edema; No deformity. Neuro:   Nonfocal  Psych: Normal affect  L chest/PPM site: dressing is dry, no significant swelling, no SQ air is appreciated, no hematoma  Labs    Chemistry Recent Labs Lab 09/18/17 0239 09/19/17 0347 09/20/17 0228  NA 133* 137 134*  K 3.9 5.2* 4.3  CL 94* 97* 91*  CO2 27 28 32  GLUCOSE 132* 127* 117*  BUN 32* 31* 20  CREATININE 1.13* 1.23* 0.91  CALCIUM 8.8* 9.0 8.6*  GFRNONAA 50* 45* >60  GFRAA 57* 52* >60  ANIONGAP 12 12 11      Hematology Recent Labs Lab 09/18/17 0239 09/19/17 0347 09/20/17 0228  WBC 15.6* 15.6* 17.9*  RBC 3.42* 3.47* 3.53*  HGB 10.1* 10.2* 10.3*  HCT 30.1* 30.8* 31.9*  MCV 88.0 88.8 90.4  MCH 29.5 29.4 29.2  MCHC 33.6 33.1 32.3  RDW 15.3 15.5 15.6*  PLT 269 332 364    Cardiac EnzymesNo results for input(s): TROPONINI in the last 168  hours. No results for input(s): TROPIPOC in the last 168 hours.   BNPNo results for input(s): BNP, PROBNP in the last 168 hours.   DDimer No results for input(s): DDIMER in the last 168 hours.   Radiology    Dg Chest 2 View Result Date: 09/20/2017 CLINICAL DATA:  66 year old female status post mitral valve repair and pacemaker placement this month. EXAM: CHEST  2 VIEW COMPARISON:  09/19/2017 and earlier. FINDINGS: PA and lateral views. Stable left chest dual lead transvenous cardiac pacemaker. No pneumothorax. Moderate size right and small to moderate left pleural effusions with associated bibasilar opacity. Pulmonary vascularity is within normal limits. Stable cardiac size and mediastinal contours. Visualized tracheal air column is within normal limits. Osteopenia. No acute osseous abnormality identified. Stable surgical clips in the visible abdomen. Negative visible bowel gas pattern. IMPRESSION: 1. Moderate right and small to moderate left pleural effusions. 2. No other acute cardiopulmonary abnormality. Electronically Signed   By: Genevie Ann M.D.   On: 09/20/2017 07:56     Cardiac Studies   09/13/17: inter-op  TEE Result status: Final result   Left ventricle: Normal cavity size and wall thickness. The LV cavity measured 4.4 cm at end-diastole and 2.9 cm at end-systole in the transgastric short axis view at the mid-papillary level. There was normal wall thickness. There was normal LV systolic function with the ejection fraction estimated at 60-65%. In the post-bypass exam, there was ventricular dyssynchrony consistent with ventricular pacing. The ejection fraction was estimated at 50-55%.  Septum: No Patent Foramen Ovale present.  Left atrium: Patent foramen ovale not present.  Left atrium: Cavity is mildly dilated. No spontaneous echo contrast.  Aortic valve: The valve is trileaflet. Moderate valve thickening present. Moderate valve calcification present. Mild to moderate regurgitation.  Pulmonic valve: Trace regurgitation.  Right ventricle: In the pre-bypass exam, the right ventricular cavity was of normal size with mildly reduced systolic function especially in the infundibulum. There was no interventricular septal flattening. In the post-bypass period, there was modearte RV systolic dysfunction. There was decreased contractility of the RV free wall, but no interventricular septal flattening.  Tricuspid valve: The tricuspid valve appeared appeared structurally normal with mild tricuspid insufficiency.  Mitral valve: There was marked thickening of the mitral leaflets with severe calcification at the base of the anterior leaflet. There was no mitral stenosis. The mean transmitral gradient was 4 mm hg. The mitral valve area by pressure halftime was 3.28 cm2. There was moderate to severe mitral regurgitation with a central and posteriorly directed jet which wrapped along the posterior left atrial wall. The vena contracta measured 0.65 cm2. There was blunted systolic flow in the left and right up     06/27/17: R/LHC  There is moderate (3+) mitral regurgitation. 1. No significant coronary disease.   2. Normal LV systolic function.  3. 3+ mitral regurgitation.  4. Severe mitral stenosis.  5. No more than mild aortic stenosis.  6. Mildly elevated PCWP with near-normal LVEDP.  7. 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  8. Root shot not done to assess aortic insufficiency, will have TEE on Thursday.    Patient Profile     66 y.o. 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 Caryl Comes), and most recently MS that progressed to severe and now s/p MVR (porcine) on 09/13/17 with persistent CHB underwent PPM implant yesterday with Dr. Rayann Heman  Assessment & Plan    1. CHB s/p PPM yesterday     CXR this AM without ptx     Pacemaker interrogation this AM with intact function     Wound care and activity instructions were reviewed with the patient     Site dressing is dry, no hematoma, I do not appreciate any SQ air or any significant swelling     Post pacer follow up has been arranged  2. Severe MS s/p Minimally invasive MVR (porcine) POD #7 3. ST     Metoprolol and amiodarone have been added to her tx   Continue care with CTS service/primary team EP service remains available, please recall if needed   For questions or updates, please contact [place] Please consult [email or web address] for contact info under Cardiology/STEMI.      Signed, Baldwin Jamaica, PA-C  09/20/2017, 8:26 AM     I have seen, examined the patient, and reviewed the above assessment and plan.  Changes to above are made where necessary.  On exam, tachycardic regular rhythm.  Decreased BS at R base.  Device  interrogation is reviewed and normal.  CXR reveals stable leads, no ptx.  She does have a moderate R pleural effusion.  Sinus tachycardia is noted.  She reports that her heart rates have chronically been this high.  I am hopeful that as her medical condition improves, her heart rates will slowly lower.  Metoprolol has been resumed.  Would increase to 50mg  BID if tachycardia persists.  Electrophysiology team to see as needed while here. Please call with questions. Routine wound care and follow-up will be arranged.  Co Sign: Thompson Grayer, MD 09/20/2017 8:50 AM

## 2017-09-20 NOTE — Care Management Note (Addendum)
Case Management Note  Patient Details  Name: Patricia [name] MRN: [id number] Date of Birth: 05-23-1951  Subjective/Objective:  From home, POD 2 MVR, conts on milrinone, co ox of 63.6, chest tubes, cont to diurese.  10/8 0920 - POD 5 MVR, she states her friend Santiago Glad will be staying with her 24/7, patient states she has a rolling walker at home also.  She states her spouse had AHC in the past , her husband recently passed.  She remains in CHB, EPS to see for PPM placement, cont to diuresis, plan to transfer to SDU.     10/10 1257 [name] RN, BSN - POD 1 PPM implant. Plan for dc tomorrow.                               Action/Plan: NCM will follow for dc needs.  Expected Discharge Date:                  Expected Discharge Plan:  Home/Self Care  In-House Referral:     Discharge planning Services  CM Consult  Post Acute Care Choice:    Choice offered to:     DME Arranged:    DME Agency:     HH Arranged:    [place] Agency:     Status of Service:  Completed, signed off  If discussed at [employer] of Stay Meetings, dates discussed:    Additional Comments:  Zenon Mayo, RN 09/20/2017, 11:22 AM

## 2017-09-21 LAB — PROTIME-INR
INR: 2.05
Prothrombin Time: 23 seconds — ABNORMAL HIGH (ref 11.4–15.2)

## 2017-09-21 MED ORDER — WARFARIN SODIUM 2 MG PO TABS: 2.0000 mg | ORAL_TABLET | Freq: Every day | ORAL | Status: DC

## 2017-09-21 MED ORDER — COUMADIN BOOK
Status: DC
  Filled 2017-09-21: qty 1

## 2017-09-21 MED ORDER — FUROSEMIDE 40 MG PO TABS
40.0000 mg | ORAL_TABLET | Freq: Two times a day (BID) | ORAL | 0 refills | Status: DC
Start: 2017-09-21 — End: 2017-09-27

## 2017-09-21 MED ORDER — WARFARIN SODIUM 2 MG PO TABS: 2.0000 mg | ORAL_TABLET | Freq: Every day | ORAL | 1 refills | Status: DC

## 2017-09-21 MED ORDER — AMIODARONE HCL 200 MG PO TABS: 200.0000 mg | ORAL_TABLET | Freq: Every day | ORAL | 1 refills | Status: DC

## 2017-09-21 MED ORDER — ACETAMINOPHEN 325 MG PO TABS: 650.0000 mg | ORAL_TABLET | Freq: Two times a day (BID) | ORAL | Status: AC | PRN

## 2017-09-21 MED ORDER — WARFARIN VIDEO
Freq: Once | Status: AC
  Administered 2017-09-21: 11:00:00

## 2017-09-21 MED ORDER — TRAMADOL HCL 50 MG PO TABS: ORAL_TABLET | ORAL | 0 refills | Status: DC

## 2017-09-21 MED ORDER — POTASSIUM CHLORIDE CRYS ER 20 MEQ PO TBCR: 20.0000 meq | EXTENDED_RELEASE_TABLET | Freq: Every day | ORAL | 0 refills | Status: DC

## 2017-09-21 NOTE — Care Management Important Message (Signed)
Important Message  Patient Details  Name: Patricia [name] MRN: [id number] Date of Birth: March 07, 1951   Medicare Important Message Given:  Yes    Nathen May 09/21/2017, 9:39 AM

## 2017-09-21 NOTE — Progress Notes (Addendum)
      [address]       [place] [address]             [phone]        2 Days Post-Op Procedure(s) (LRB): PACEMAKER IMPLANT (N/A)  Subjective: Patient without complaints. She wants to go home.  Objective: Vital signs in last 24 hours: Temp:  [97.9 F (36.6 C)-98.8 F (37.1 C)] 98.6 F (37 C) (10/11 0340) Pulse Rate:  [100-126] 116 (10/10 2114) Cardiac Rhythm: Ventricular paced (10/10 1950) Resp:  [15-27] 21 (10/11 0340) BP: (106-135)/(52-79) 106/53 (10/11 0340) SpO2:  [91 %-96 %] 94 % (10/11 0340) Weight:  [139 lb 14.4 oz (63.5 kg)] 139 lb 14.4 oz (63.5 kg) (10/11 0340)  Pre op weight 59.4 kg Current Weight  09/21/17 139 lb 14.4 oz (63.5 kg)      Intake/Output from previous day: 10/10 0701 - 10/11 0700 In: 480 [P.O.:480] Out: -    Physical Exam:  Cardiovascular: V paced, tachy Pulmonary: Clear to auscultation bilaterally; no rales, wheezes, or rhonchi. Abdomen: Soft, non tender, bowel sounds present. Extremities: Blateral lower extremity edema. Wounds: Clean and dry.  No erythema or signs of infection.  Lab Results: CBC: Recent Labs  09/19/17 0347 09/20/17 0228  WBC 15.6* 17.9*  HGB 10.2* 10.3*  HCT 30.8* 31.9*  PLT 332 364   BMET:  Recent Labs  09/19/17 0347 09/20/17 0228  NA 137 134*  K 5.2* 4.3  CL 97* 91*  CO2 28 32  GLUCOSE 127* 117*  BUN 31* 20  CREATININE 1.23* 0.91  CALCIUM 9.0 8.6*    PT/INR:  Lab Results  Component Value Date   INR 2.05 09/21/2017   INR 1.72 09/20/2017   INR 1.58 09/19/2017   ABG:  INR: Will add last result for INR, ABG once components are confirmed Will add last 4 CBG results once components are confirmed  Assessment/Plan:  1. CV - CHB and s/p PPM 10/09. Ventricular paced, tachycardic. On Amiodarone 200 mg daily, Lopressor 50 mg bid, and Coumadin. INR increased from 1.72 to 2.05. Will decrease Coumadin dose to 2 mg. 2.  Pulmonary - On room air. Encourage incentive spirometer. 3.  Volume Overload - On Lasix 40 mg bid and will continue for one week. 4.  Acute blood loss anemia - H and H stable yesterday at 10.3 and 31.9 5. As discussed with Dr. Roxy Manns, will discharge home.  ZIMMERMAN,DONIELLE MPA-C 09/21/2017,7:19 AM  I have seen and examined the patient and agree with the assessment and plan as outlined.  Follow up early next week in Afib clinic to reassess rhythm and HR.  Continue lasix for another week.  Decrease Coumadin.  Instructions given  Rexene Alberts, MD 09/21/2017 10:07 AM

## 2017-09-21 NOTE — Progress Notes (Signed)
CARDIAC REHAB PHASE I   PRE:  Rate/Rhythm: 118 paced  BP:  Supine:   Sitting: 101/58  Standing:    SaO2: 95%RA  MODE:  Ambulation: 460 ft   POST:  Rate/Rhythm: 124 paced  BP:  Supine:   Sitting: 144/62  Standing:    SaO2: 96%RA 0930-1007 Pt walked 460 ft on RA with rolling walker with steady gait. Has walker available at home to use. Education completed with pt who voiced understanding. Encouraged IS and flutter valve, ex ed and heart healthy diet including watching carbs. Pt is on Coumadin and would like to see pharmacist. Discussed CRP 2 and will refer to [facility] program.   Graylon Good, RN BSN  09/21/2017 10:02 AM

## 2017-09-22 ENCOUNTER — Telehealth (HOSPITAL_COMMUNITY): Payer: Self-pay

## 2017-09-22 NOTE — Telephone Encounter (Signed)
Verified [employer]. No co-pay, no deductible, no out of pocket amount, no co-insurance, and no pre-authorization is required. Passport/reference # [id number]

## 2017-09-22 NOTE — Care Management Note (Signed)
Case Management Note Original Note Created Zenon Mayo, RN 09/20/2017, 11:22 AM  Patient Details  Name: Patricia [name] MRN: [id number] Date of Birth: 1950-12-19  Subjective/Objective:  From home, POD 2 MVR, conts on milrinone, co ox of 63.6, chest tubes, cont to diurese.  10/8 0920 - POD 5 MVR, she states her friend Santiago Glad will be staying with her 24/7, patient states she has a rolling walker at home also.  She states her spouse had AHC in the past , her husband recently passed.  She remains in CHB, EPS to see for PPM placement, cont to diuresis, plan to transfer to SDU.     10/10 1257 [name] RN, BSN - POD 1 PPM implant. Plan for dc tomorrow.                               Action/Plan: NCM will follow for dc needs.  Expected Discharge Date:  09/21/17               Expected Discharge Plan:  Home/Self Care  In-House Referral:  NA  Discharge planning Services  CM Consult  Post Acute Care Choice:  NA Choice offered to:  NA  DME Arranged:    DME Agency:     HH Arranged:    HH Agency:     Status of Service:  Completed, signed off  If discussed at [facility] of Stay Meetings, dates discussed:    Discharge Disposition: home/self care  Additional Comments:  09/21/17- 1100- [name] RN, CM-- pt for d/c home today- no CM needs noted for discharge.   [name], RN 09/22/2017, 12:02 PM [phone]

## 2017-09-22 NOTE — Telephone Encounter (Signed)
Verified insurance. $20 co-payment, no deductible, out of pocket amount is $4,000/$806.85 has been met, no co-insurance, and no pre-authorization is required. Passport/reference # [id number]

## 2017-09-25 ENCOUNTER — Ambulatory Visit (INDEPENDENT_AMBULATORY_CARE_PROVIDER_SITE_OTHER): Payer: Medicare Other | Admitting: Pharmacist

## 2017-09-25 DIAGNOSIS — Z953 Presence of xenogenic heart valve: Secondary | ICD-10-CM

## 2017-09-25 DIAGNOSIS — Z7901 Long term (current) use of anticoagulants: Secondary | ICD-10-CM | POA: Diagnosis not present

## 2017-09-25 LAB — POCT INR: INR: 1.1

## 2017-09-25 NOTE — Patient Instructions (Signed)
Prothrombin Time, International Normalized Ratio Test Why am I having this test? A prothrombin time (Pro-Time, PT) test measures how many seconds it takes your blood to clot. The international normalized ratio (INR) is a calculation of blood clotting time based on your PT result. Most labs report both PT and INR values when reporting blood clotting times. Your health care provider may want you to have this test done if:  You have certain medical conditions that cause abnormal bleeding or blood clotting. These can include: ? Liver disease. ? Systemic infection (sepsis). ? Inherited (genetic) bleeding disorders.  You are taking a medicine to prevent excessive blood clotting (anticoagulant), such as warfarin. ? If you are taking warfarin, you will likely be asked to have this test done at regular intervals. The results of this test will help your health care provider determine what dose of warfarin you need based on how quickly or slowly your blood clots. It is very important to have this test done as often as your health care provider recommends.  What kind of sample is taken? A blood sample is required for this test. It is usually collected by inserting a needle into a vein or by sticking a finger with a small needle. How do I prepare for this test? There is no preparation required for this test. What are the reference intervals? Reference intervalsare considered healthy intervalsestablished after testing a large group of healthy people. Reference intervals may vary among different people, labs, and hospitals. It is your responsibility to obtain your test results. Ask the lab or department performing the test when and how you will get your results. Reference intervals for this test are as follows:  Without anticoagulant treatment (control value): 11.0-12.5 seconds; 85-100%.  With full anticoagulant treatment: greater than 1.5-2 times the control value; 20-30%.  INR: 0.8-1.1.  What do the  results mean? There are several factors that can alter your PT and INR test results. It is important for you to know that:  PT and INR results can be affected by certain foods you eat, especially foods that contain moderate or high amounts of vitamin K. It is important to eat a consistent amount of vitamin K-rich food. Let your health care provider know if you have recently changed your diet.  PT and INR results can be affected by some medicines. Do not stop, add, or change any medicines without letting your health care provider know.  Talk with your health care provider to discuss your results, treatment options, and if necessary, the need for more tests. Talk with your health care provider if you have any questions about your results. Talk with your health care provider to discuss your results, treatment options, and if necessary, the need for more tests. Talk with your health care provider if you have any questions about your results. This information is not intended to replace advice given to you by your health care provider. Make sure you discuss any questions you have with your health care provider. Document Released: 12/31/2004 Document Revised: 08/03/2016 Document Reviewed: 04/23/2014 Elsevier Interactive Patient Education  2017 Elsevier Inc.     Vitamin K Foods and Warfarin Warfarin is a blood thinner (anticoagulant). Anticoagulant medicines help prevent the formation of blood clots. These medicines work by decreasing the activity of vitamin K, which promotes normal blood clotting. When you take warfarin, problems can occur from suddenly increasing or decreasing the amount of vitamin K that you eat from one day to the next. Problems may include:    Blood clots.  Bleeding.  What general guidelines do I need to follow? To avoid problems when taking warfarin:  Eat a balanced diet that includes: ? Fresh fruits and vegetables. ? Whole grains. ? Low-fat dairy products. ? Lean  proteins, such as fish, eggs, and lean cuts of meat.  Keep your intake of vitamin K consistent from day to day. To do this: ? Avoid eating large amounts of vitamin K one day and low amounts of vitamin K the next day. ? If you take a multivitamin that contains vitamin K, be sure to take it every day. ? Know which foods contain vitamin K. Use the lists below to understand serving sizes and the amount of vitamin K in one serving.  Avoid major changes in your diet. If you are going to change your diet, talk with your health care provider before making changes.  Work with a nutrition specialist (dietitian) to develop a meal plan that works best for you.  High vitamin K foods Foods that are high in vitamin K contain more than 100 mcg (micrograms) per serving. These include:  Broccoli (cooked) -  cup has 110 mcg.  Brussels sprouts (cooked) -  cup has 109 mcg.  Greens, beet (cooked) -  cup has 350 mcg.  Greens, collard (cooked) -  cup has 418 mcg.  Greens, turnip (cooked) -  cup has 265 mcg.  Green onions or scallions -  cup has 105 mcg.  Kale (fresh or frozen) -  cup has 531 mcg.  Parsley (raw) - 10 sprigs has 164 mcg.  Spinach (cooked) -  cup has 444 mcg.  Swiss chard (cooked) -  cup has 287 mcg.  Moderate vitamin K foods Foods that have a moderate amount of vitamin K contain 25-100 mcg per serving. These include:  Asparagus (cooked) - 5 spears have 38 mcg.  Black-eyed peas (dried) -  cup has 32 mcg.  Cabbage (cooked) -  cup has 37 mcg.  Kiwi fruit - 1 medium has 31 mcg.  Lettuce - 1 cup has 57-63 mcg.  Okra (frozen) -  cup has 44 mcg.  Prunes (dried) - 5 prunes have 25 mcg.  Watercress (raw) - 1 cup has 85 mcg.  Low vitamin K foods Foods low in vitamin K contain less than 25 mcg per serving. These include:  Artichoke - 1 medium has 18 mcg.  Avocado - 1 oz. has 6 mcg.  Blueberries -  cup has 14 mcg.  Cabbage (raw) -  cup has 21 mcg.  Carrots  (cooked) -  cup has 11 mcg.  Cauliflower (raw) -  cup has 11 mcg.  Cucumber with peel (raw) -  cup has 9 mcg.  Grapes -  cup has 12 mcg.  Mango - 1 medium has 9 mcg.  Nuts - 1 oz. has 15 mcg.  Pear - 1 medium has 8 mcg.  Peas (cooked) -  cup has 19 mcg.  Pickles - 1 spear has 14 mcg.  Pumpkin seeds - 1 oz. has 13 mcg.  Sauerkraut (canned) -  cup has 16 mcg.  Soybeans (cooked) -  cup has 16 mcg.  Tomato (raw) - 1 medium has 10 mcg.  Tomato sauce -  cup has 17 mcg.  Vitamin K-free foods If a food contain less than 5 mcg per serving, it is considered to have no vitamin K. These foods include:  Bread and cereal products.  Cheese.  Eggs.  Fish and shellfish.  Meat and poultry.    Milk and dairy products.  Sunflower seeds.  Actual amounts of vitamin K in foods may be different depending on processing. Talk with your dietitian about what foods you can eat and what foods you should avoid. This information is not intended to replace advice given to you by your health care provider. Make sure you discuss any questions you have with your health care provider. Document Released: 09/25/2009 Document Revised: 06/19/2016 Document Reviewed: 03/02/2016 Elsevier Interactive Patient Education  2017 Elsevier Inc.   

## 2017-09-27 ENCOUNTER — Ambulatory Visit (HOSPITAL_COMMUNITY)
Admit: 2017-09-27 | Discharge: 2017-09-27 | Disposition: A | Discharge status: Home / Self Care | Payer: Medicare Other | Attending: Nurse Practitioner | Admitting: Nurse Practitioner

## 2017-09-27 ENCOUNTER — Encounter (HOSPITAL_COMMUNITY): Payer: Self-pay | Admitting: Nurse Practitioner

## 2017-09-27 VITALS — BP 108/56 | HR 104 | Ht 66.0 in | Wt 140.0 lb

## 2017-09-27 DIAGNOSIS — K859 Acute pancreatitis without necrosis or infection, unspecified: Secondary | ICD-10-CM | POA: Diagnosis not present

## 2017-09-27 DIAGNOSIS — Z8249 Family history of ischemic heart disease and other diseases of the circulatory system: Secondary | ICD-10-CM | POA: Insufficient documentation

## 2017-09-27 DIAGNOSIS — I442 Atrioventricular block, complete: Secondary | ICD-10-CM | POA: Diagnosis present

## 2017-09-27 DIAGNOSIS — Z952 Presence of prosthetic heart valve: Secondary | ICD-10-CM | POA: Insufficient documentation

## 2017-09-27 DIAGNOSIS — Z9221 Personal history of antineoplastic chemotherapy: Secondary | ICD-10-CM | POA: Insufficient documentation

## 2017-09-27 DIAGNOSIS — Z882 Allergy status to sulfonamides status: Secondary | ICD-10-CM | POA: Insufficient documentation

## 2017-09-27 DIAGNOSIS — Z91018 Allergy to other foods: Secondary | ICD-10-CM | POA: Insufficient documentation

## 2017-09-27 DIAGNOSIS — E8770 Fluid overload, unspecified: Secondary | ICD-10-CM | POA: Insufficient documentation

## 2017-09-27 DIAGNOSIS — Z9081 Acquired absence of spleen: Secondary | ICD-10-CM | POA: Diagnosis not present

## 2017-09-27 DIAGNOSIS — Z9012 Acquired absence of left breast and nipple: Secondary | ICD-10-CM | POA: Diagnosis not present

## 2017-09-27 DIAGNOSIS — Z79891 Long term (current) use of opiate analgesic: Secondary | ICD-10-CM | POA: Diagnosis not present

## 2017-09-27 DIAGNOSIS — I4892 Unspecified atrial flutter: Secondary | ICD-10-CM | POA: Diagnosis not present

## 2017-09-27 DIAGNOSIS — Z79899 Other long term (current) drug therapy: Secondary | ICD-10-CM | POA: Diagnosis not present

## 2017-09-27 DIAGNOSIS — R7303 Prediabetes: Secondary | ICD-10-CM | POA: Diagnosis not present

## 2017-09-27 DIAGNOSIS — Z8571 Personal history of Hodgkin lymphoma: Secondary | ICD-10-CM | POA: Diagnosis not present

## 2017-09-27 DIAGNOSIS — R000 Tachycardia, unspecified: Secondary | ICD-10-CM | POA: Diagnosis not present

## 2017-09-27 DIAGNOSIS — I340 Nonrheumatic mitral (valve) insufficiency: Secondary | ICD-10-CM | POA: Insufficient documentation

## 2017-09-27 DIAGNOSIS — Z923 Personal history of irradiation: Secondary | ICD-10-CM | POA: Diagnosis not present

## 2017-09-27 DIAGNOSIS — Z880 Allergy status to penicillin: Secondary | ICD-10-CM | POA: Insufficient documentation

## 2017-09-27 DIAGNOSIS — Z833 Family history of diabetes mellitus: Secondary | ICD-10-CM | POA: Insufficient documentation

## 2017-09-27 DIAGNOSIS — Z9889 Other specified postprocedural states: Secondary | ICD-10-CM | POA: Diagnosis not present

## 2017-09-27 DIAGNOSIS — Z7901 Long term (current) use of anticoagulants: Secondary | ICD-10-CM | POA: Diagnosis not present

## 2017-09-27 DIAGNOSIS — Z953 Presence of xenogenic heart valve: Secondary | ICD-10-CM

## 2017-09-27 DIAGNOSIS — Z881 Allergy status to other antibiotic agents status: Secondary | ICD-10-CM | POA: Diagnosis not present

## 2017-09-27 DIAGNOSIS — Z7982 Long term (current) use of aspirin: Secondary | ICD-10-CM | POA: Insufficient documentation

## 2017-09-27 DIAGNOSIS — Z800 Family history of malignant neoplasm of digestive organs: Secondary | ICD-10-CM | POA: Insufficient documentation

## 2017-09-27 DIAGNOSIS — K219 Gastro-esophageal reflux disease without esophagitis: Secondary | ICD-10-CM | POA: Diagnosis not present

## 2017-09-27 DIAGNOSIS — Z9842 Cataract extraction status, left eye: Secondary | ICD-10-CM | POA: Diagnosis not present

## 2017-09-27 DIAGNOSIS — J449 Chronic obstructive pulmonary disease, unspecified: Secondary | ICD-10-CM | POA: Insufficient documentation

## 2017-09-27 LAB — BASIC METABOLIC PANEL
Anion gap: 9 (ref 5–15)
BUN: 17 mg/dL (ref 6–20)
CO2: 30 mmol/L (ref 22–32)
Calcium: 9.1 mg/dL (ref 8.9–10.3)
Chloride: 95 mmol/L — ABNORMAL LOW (ref 101–111)
Creatinine, Ser: 1.21 mg/dL — ABNORMAL HIGH (ref 0.44–1.00)
GFR calc Af Amer: 53 mL/min — ABNORMAL LOW (ref >60)
GFR calc non Af Amer: 46 mL/min — ABNORMAL LOW (ref >60)
Glucose, Bld: 126 mg/dL — ABNORMAL HIGH (ref 65–99)
Potassium: 4.5 mmol/L (ref 3.5–5.1)
Sodium: 134 mmol/L — ABNORMAL LOW (ref 135–145)

## 2017-09-27 LAB — CBC
HCT: 29.8 % — ABNORMAL LOW (ref 36.0–46.0)
Hemoglobin: 9.5 g/dL — ABNORMAL LOW (ref 12.0–15.0)
MCH: 29.4 pg (ref 26.0–34.0)
MCHC: 31.9 g/dL (ref 30.0–36.0)
MCV: 92.3 fL (ref 78.0–100.0)
Platelets: 590 10*3/uL — ABNORMAL HIGH (ref 150–400)
RBC: 3.23 MIL/uL — ABNORMAL LOW (ref 3.87–5.11)
RDW: 16.4 % — ABNORMAL HIGH (ref 11.5–15.5)
WBC: 15.7 10*3/uL — ABNORMAL HIGH (ref 4.0–10.5)

## 2017-09-27 LAB — T4, FREE: Free T4: 1.71 ng/dL — ABNORMAL HIGH (ref 0.61–1.12)

## 2017-09-27 LAB — TSH: TSH: 4.53 u[IU]/mL — ABNORMAL HIGH (ref 0.350–4.500)

## 2017-09-27 MED ORDER — FUROSEMIDE 40 MG PO TABS
60.0000 mg | ORAL_TABLET | Freq: Two times a day (BID) | ORAL | 0 refills | Status: DC
Start: 2017-09-27 — End: 2017-10-09

## 2017-09-27 MED ORDER — POTASSIUM CHLORIDE CRYS ER 20 MEQ PO TBCR: 20.0000 meq | EXTENDED_RELEASE_TABLET | Freq: Every day | ORAL | 0 refills | Status: DC

## 2017-09-27 NOTE — Patient Instructions (Addendum)
Increase lasix (furosemide) to 60mg  twice a day (1 and 1/2 tablets of the 40mg  tab twice a day)  Continue Potassium 64meq once a day  Continue until you see Dr. Roxy Manns PA for follow up next week

## 2017-09-28 LAB — T3, FREE: T3, Free: 2.3 pg/mL (ref 2.0–4.4)

## 2017-09-28 NOTE — Progress Notes (Signed)
Primary Care Physician: Susy Frizzle, MD Referring Physician: Dr. Roxy Manns EP: Dr. Lawrence Marseilles Patricia [name] is a 66 y.o. female with a h/o mitral regurgitation, aortic insufficiency, atrial flutter status post ablation in 1998, Hodgkin's lymphoma status post laparotomy for splenectomy and liver biopsy followed by chemotherapy and chest radiation 1974, 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   Pt proceeded to surgery 10/5 for minimally invasive  Mitral valve repair complicated by CHB and PPM was implanted by Dr. Rayann Heman 10/9. Her HR stayed elevated in the hospital. Rasing some concern for presence of afib, but interrogation did not show presence of same. She was 10 lbs fluid overloaded at time of discharge, 10/11, compared to pre hospital and she was d/c on 1 week of diuretics.  F/u in afib clinic 10/17. Device interrogated and there are no arrhythmias noted.. She is A sensed /V paced at 104 bpm, HR has slowed from 114  day of d/c. Her biggest complaint today is that she remains 10 lbs fluid overloaded and she can only fit her feet into one pair of shoes. She feels bloated and her left arm is swollen compared to the right.( previous mastectomy with lymph node removal on the L side) PPM site with intact steri strips, site without signs of infection. Denies  Cough, fever.  Today, she denies symptoms of palpitations, chest pain,  mild shortness of breath, orthopnea, PND, , dizziness, presyncope, syncope, or neurologic sequela. + for LLE. The patient is tolerating medications without difficulties and is otherwise without complaint today.   Past Medical  History:  Diagnosis Date  . Atrial flutter (HCC)    Ablated 1998 Dr. Caryl Comes  . Bleeding gastric ulcer    back in 2000  . Breast cancer ([place])   . COPD (chronic obstructive pulmonary disease) (HCC)    from radiation  . GERD (gastroesophageal reflux disease)   . Hodgkin's lymphoma ([place])    Treated with radiation and chemo  . Mitral regurgitation    pvc  . Osteoporosis    lumbar verterbral fracture, left ankle fracture, dexa 2015  . Pancreatitis   . Pneumonia   . Prediabetes   . 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 Ulcer    Past Surgical History:  Procedure Laterality Date  . BREAST SURGERY     breast cancer since 2005   left mastectomy  . CARDIAC ELECTROPHYSIOLOGY [place] AND ABLATION  1999  . CATARACT EXTRACTION, BILATERAL    . COLONOSCOPY    . IR RADIOLOGY PERIPHERAL GUIDED IV START  08/03/2017  . IR US GUIDE VASC ACCESS RIGHT  08/03/2017  . LAPAROTOMY    . MASTECTOMY  2005   Left-axillary  . MITRAL VALVE REPAIR Right 09/13/2017   Procedure: MINIMALLY INVASIVE MITRAL VALVE REPLACEMENT (MVR) with Medtronic Mosaic Porcine Heart Valve size 61mm;  Surgeon: Rexene Alberts, MD;  Location: [facility];  Service: Open Heart Surgery;  Laterality: Right;  . ORIF ANKLE FRACTURE Left 03/21/2014   Procedure: LEFT ANKLE FRACTURE OPEN TREATMENT BILMALLEOLAR INCLUDES INTERNAL FIXATION ;  Surgeon: Renette Butters, MD;  Location: [facility];  Service:  Orthopedics;  Laterality: Left;  . PACEMAKER IMPLANT N/A 09/19/2017   Procedure: PACEMAKER IMPLANT;  Surgeon: Thompson Grayer, MD;  Location: [facility];  Service: Cardiovascular;  Laterality: N/A;  . RIGHT/LEFT HEART CATH AND CORONARY ANGIOGRAPHY N/A 06/27/2017   Procedure: Right/Left Heart Cath and Coronary Angiography;  Surgeon: Larey [name], MD;  Location: [facility];  Service: Cardiovascular;   Laterality: N/A;  . 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 TEE WITHOUT CARDIOVERSION  06/12/2012   Procedure: TRANSESOPHAGEAL ECHOCARDIOGRAM (TEE);  Surgeon: Larey [name], MD;  Location: [facility];  Service: Cardiovascular;  Laterality: N/A;  . TEE WITHOUT CARDIOVERSION N/A 06/29/2017   Procedure: TRANSESOPHAGEAL ECHOCARDIOGRAM (TEE);  Surgeon: Jerline Pain, MD;  Location: [facility];  Service: Cardiovascular;  Laterality: N/A;  . TEE WITHOUT CARDIOVERSION N/A 09/13/2017   Procedure: TRANSESOPHAGEAL ECHOCARDIOGRAM (TEE);  Surgeon: Rexene Alberts, MD;  Location: [facility];  Service: Open Heart Surgery;  Laterality: N/A;  . TONSILLECTOMY AND ADENOIDECTOMY      Current Outpatient Prescriptions  Medication Sig Dispense Refill  . acetaminophen (TYLENOL) 325 MG tablet Take 2 tablets (650 mg total) by mouth 2 (two) times daily as needed for moderate pain or headache.    Marland Kitchen amiodarone (PACERONE) 200 MG tablet Take 1 tablet (200 mg total) by mouth daily. 30 tablet 1  . aspirin EC 81 MG tablet Take 81 mg by mouth 3 (three) times a week.    . Calcium Carbonate-Vitamin D (CALCIUM PLUS VITAMIN D PO) Take 2 tablets by mouth daily.    . cetirizine (ZYRTEC) 10 MG tablet Take 10 mg by mouth daily as needed for allergies.    Marland Kitchen esomeprazole (NEXIUM) 40 MG capsule Take 40 mg by mouth daily before breakfast.   12  . furosemide (LASIX) 40 MG tablet Take 1.5 tablets (60 mg total) by mouth 2 (two) times daily. For week then stop. 21 tablet 0  . latanoprost (XALATAN) 0.005 % ophthalmic solution Place 1 drop into both eyes at bedtime.     . lipase/protease/amylase (CREON) 12000 UNITS CPEP capsule Take 1 capsule (12,000 Units total) by mouth 3 (three) times daily with meals. (Patient taking differently: Take 12,000 Units by mouth 3 (three) times daily before meals. ) 270 capsule 1  . metoprolol (LOPRESSOR) 50 MG tablet TAKE 1 TABLET (50 MG TOTAL) BY MOUTH 2 (TWO) TIMES DAILY. 180 tablet 3  . Multiple Vitamin  (MULTIVITAMIN WITH MINERALS) TABS tablet Take 1 tablet by mouth daily. Centrum Silver.    Glory Rosebush DELICA LANCETS 69G MISC TEST SUGAR ONCE A WEEK  4  . ONETOUCH VERIO test strip CHECK SUGAR ONCE A WEEK  12  . potassium chloride SA (K-DUR,KLOR-CON) 20 MEQ tablet Take 1 tablet (20 mEq total) by mouth daily. For one week. 7 tablet 0  . traMADol (ULTRAM) 50 MG tablet Take 50 mg by mouth every 4-6 hours PRN severe pain. 30 tablet 0  . warfarin (COUMADIN) 2 MG tablet Take 1 tablet (2 mg total) by mouth daily at 6 PM. Or as directed. 30 tablet 1   No current facility-administered medications for this encounter.     Allergies  Allergen Reactions  . Azithromycin Other (See Comments)    pancreatitis  . Penicillins Anaphylaxis, Hives, Swelling and Other (See Comments)    PATIENT HAS HAD A PCN REACTION WITH IMMEDIATE RASH, FACIAL/TONGUE/THROAT SWELLING, SOB, OR LIGHTHEADEDNESS WITH HYPOTENSION:  #  #  #  YES  #  #  #  HAS PT DEVELOPED SEVERE RASH INVOLVING MUCUS MEMBRANES or SKIN NECROSIS: #  #  #  YES  #  #  #  Has patient had a PCN reaction that required hospitalization: No Has patient had a PCN reaction occurring within the last 10 years: No   . Sulfa Antibiotics Hives and Rash  . Cheese Nausea And Vomiting    Social History   Social History  . Marital status: Widowed    Spouse name: N/A  . Number of children: N/A  . Years of education: N/A   Occupational History  . Teacher (retired)    Social History Main Topics  . Smoking status: Never Smoker  . Smokeless tobacco: Never Used  . Alcohol use No  . Drug use: No  . Sexual activity: Yes   Other Topics Concern  . Not on file   Social History Narrative   Lives at home with husband.    Family History  Problem Relation Age of Onset  . Rheumatic fever Father        Died suddenly age 17  . Diabetes Father   . Cancer Father        colon  . Heart disease Father        rheumatic fever x 3  . Arthritis Maternal Grandmother   .  Hypertension Maternal Grandmother   . Diabetes Paternal Grandmother   . Vision loss Paternal Grandmother        glaucoma    ROS- All systems are reviewed and negative except as per the HPI above  Physical Exam: Vitals:   09/27/17 1333  BP: (!) 108/56  Pulse: (!) 104  Weight: 140 lb (63.5 kg)  Height: 5\' 6"  (1.676 m)   Wt Readings from Last 3 Encounters:  09/27/17 140 lb (63.5 kg)  09/21/17 139 lb 14.4 oz (63.5 kg)  09/11/17 131 lb 9.6 oz (59.7 kg)    Labs: Lab Results  Component Value Date   NA 134 (L) 09/27/2017   K 4.5 09/27/2017   CL 95 (L) 09/27/2017   CO2 30 09/27/2017   GLUCOSE 126 (H) 09/27/2017   BUN 17 09/27/2017   CREATININE 1.21 (H) 09/27/2017   CALCIUM 9.1 09/27/2017   PHOS 4.4 12/25/2014   MG 2.4 09/14/2017   Lab Results  Component Value Date   INR 1.1 09/25/2017   Lab Results  Component Value Date   CHOL 183 07/07/2016   HDL 53 07/07/2016   LDLCALC 99 07/07/2016   TRIG 153 (H) 07/07/2016     GEN- The patient is well appearing, alert and oriented x 3 today.   Head- normocephalic, atraumatic Eyes-  Sclera clear, conjunctiva pink Ears- hearing intact Oropharynx- clear Neck- supple, no JVP Lymph- no cervical lymphadenopathy Lungs- Decreased rt base, normal work of breathing Heart- Regular rate and rhythm, no murmurs, rubs or gallops, PMI not laterally displaced, Pacer site intact without signs of infection, steri strips in place, no bruising GI- soft, NT, ND, + BS Extremities- no clubbing, cyanosis, mildly swollen without pitting edema, Left arm swollen compared to rt MS- no significant deformity or atrophy Skin- no rash or lesion Psych- euthymic mood, full affect Neuro- strength and sensation are intact  EKG-Atrial  sensed, v paced at 1-4 bpm, pr int 143 ms, qrs int 144 ms, qtc 528 ms PPM interrogated by industry and shows normal functioning PPM, without any atrial arrhthymias Epic records reviewed. CXR-10/10-FINDINGS: PA and lateral  views. Stable left chest dual lead transvenous cardiac pacemaker. No  pneumothorax. Moderate size right and small to moderate left pleural effusions with associated bibasilar opacity. Pulmonary vascularity is within normal limits. Stable cardiac size and mediastinal contours. Visualized tracheal air column is within normal limits. Osteopenia. No acute osseous abnormality identified. Stable surgical clips in the visible abdomen. Negative visible bowel gas pattern.     Assessment and Plan: 1. 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 Guy Sandifer office on Monday She is drinking 6 12 oz glasses of water a day and may want to decrease by one glass a day  Cbc, bmet, thyroid profile today  F/u with Dr. Ricard Dillon office on Monday Wound check [facility] 10/22

## 2017-09-29 ENCOUNTER — Other Ambulatory Visit: Payer: Self-pay | Admitting: Thoracic Surgery (Cardiothoracic Vascular Surgery)

## 2017-09-29 ENCOUNTER — Ambulatory Visit (INDEPENDENT_AMBULATORY_CARE_PROVIDER_SITE_OTHER): Payer: Medicare Other | Admitting: Pharmacist Clinician (PhC)/ Clinical Pharmacy Specialist

## 2017-09-29 DIAGNOSIS — Z7901 Long term (current) use of anticoagulants: Secondary | ICD-10-CM

## 2017-09-29 DIAGNOSIS — Z953 Presence of xenogenic heart valve: Secondary | ICD-10-CM

## 2017-09-29 DIAGNOSIS — Q8789 Other specified congenital malformation syndromes, not elsewhere classified: Secondary | ICD-10-CM

## 2017-09-29 LAB — POCT INR: INR: 1.1

## 2017-10-02 ENCOUNTER — Ambulatory Visit
Admission: RE | Admit: 2017-10-02 | Discharge: 2017-10-02 | Disposition: A | Discharge status: Home / Self Care | Payer: Medicare Other | Source: Ambulatory Visit | Attending: Thoracic Surgery (Cardiothoracic Vascular Surgery) | Admitting: Thoracic Surgery (Cardiothoracic Vascular Surgery)

## 2017-10-02 ENCOUNTER — Ambulatory Visit (INDEPENDENT_AMBULATORY_CARE_PROVIDER_SITE_OTHER): Payer: Medicare Other | Admitting: *Deleted

## 2017-10-02 ENCOUNTER — Ambulatory Visit (INDEPENDENT_AMBULATORY_CARE_PROVIDER_SITE_OTHER): Payer: Self-pay | Admitting: Physician Assistant

## 2017-10-02 ENCOUNTER — Other Ambulatory Visit: Payer: Self-pay | Admitting: *Deleted

## 2017-10-02 VITALS — BP 134/74 | HR 114 | Ht 66.0 in | Wt 136.0 lb

## 2017-10-02 DIAGNOSIS — I442 Atrioventricular block, complete: Secondary | ICD-10-CM

## 2017-10-02 DIAGNOSIS — Z952 Presence of prosthetic heart valve: Secondary | ICD-10-CM

## 2017-10-02 DIAGNOSIS — Q8789 Other specified congenital malformation syndromes, not elsewhere classified: Secondary | ICD-10-CM

## 2017-10-02 DIAGNOSIS — J90 Pleural effusion, not elsewhere classified: Secondary | ICD-10-CM

## 2017-10-02 NOTE — Patient Instructions (Signed)
You may return to driving an automobile as long as you are no longer requiring oral narcotic pain relievers during the daytime.  It would be wise to start driving only short distances during the daylight and gradually increase from there as you feel comfortable.   Endocarditis is a potentially serious infection of heart valves or inside lining of the heart.  It occurs more commonly in patients with diseased heart valves (such as patient's with aortic or mitral valve disease) and in patients who have undergone heart valve repair or replacement.  Certain surgical and dental procedures may put you at risk, such as dental cleaning, other dental procedures, or any surgery involving the respiratory, urinary, gastrointestinal tract, gallbladder or prostate gland.   To minimize your chances for develooping endocarditis, maintain good oral health and seek prompt medical attention for any infections involving the mouth, teeth, gums, skin or urinary tract.    Always notify your doctor or dentist about your underlying heart valve condition before having any invasive procedures. You will need to take antibiotics before certain procedures, including all routine dental cleanings or other dental procedures.  Your cardiologist or dentist should prescribe these antibiotics for you to be taken ahead of time.   You may continue to gradually increase your physical activity as tolerated.  Refrain from any heavy lifting or strenuous use of your arms and shoulders until at least 8 weeks from the time of your surgery, and avoid activities that cause increased pain in your chest on the side of your surgical incision.  Otherwise you may continue to increase activities without any particular limitations.  Increase the intensity and duration of physical activity gradually.  

## 2017-10-02 NOTE — Progress Notes (Signed)
HPI:  Patient returns for routine postoperative follow-up having undergone Mini Mitral Valve Replacement on 09/13/2017. The patient's early postoperative recovery while in the hospital was notable for development of CHB requiring placement of permanent pacemaker. Since hospital discharge the patient reports she is doing okay.  She states she continues to be fatigued and weak at times.  She denies pain, but admits to shortness of breath that she notices is worse sometimes.  She is taking diuretics.  She is ambulating independently as she can tolerate.  She states her incisions are healing well.   She states she needs to hurry as she has a follow up appointment with Dr. Rayann Heman following this one.  Current Outpatient Prescriptions  Medication Sig Dispense Refill  . acetaminophen (TYLENOL) 325 MG tablet Take 2 tablets (650 mg total) by mouth 2 (two) times daily as needed for moderate pain or headache.    Marland Kitchen amiodarone (PACERONE) 200 MG tablet Take 1 tablet (200 mg total) by mouth daily. 30 tablet 1  . aspirin EC 81 MG tablet Take 81 mg by mouth 3 (three) times a week.    . Calcium Carbonate-Vitamin D (CALCIUM PLUS VITAMIN D PO) Take 2 tablets by mouth daily.    . cetirizine (ZYRTEC) 10 MG tablet Take 10 mg by mouth daily as needed for allergies.    Marland Kitchen esomeprazole (NEXIUM) 40 MG capsule Take 40 mg by mouth daily before breakfast.   12  . furosemide (LASIX) 40 MG tablet Take 1.5 tablets (60 mg total) by mouth 2 (two) times daily. For week then stop. 21 tablet 0  . latanoprost (XALATAN) 0.005 % ophthalmic solution Place 1 drop into both eyes at bedtime.     . lipase/protease/amylase (CREON) 12000 UNITS CPEP capsule Take 1 capsule (12,000 Units total) by mouth 3 (three) times daily with meals. (Patient taking differently: Take 12,000 Units by mouth 3 (three) times daily before meals. ) 270 capsule 1  . metoprolol (LOPRESSOR) 50 MG tablet TAKE 1 TABLET (50 MG TOTAL) BY MOUTH 2 (TWO) TIMES DAILY. 180 tablet 3   . Multiple Vitamin (MULTIVITAMIN WITH MINERALS) TABS tablet Take 1 tablet by mouth daily. Centrum Silver.    Glory Rosebush DELICA LANCETS 32T MISC TEST SUGAR ONCE A WEEK  4  . ONETOUCH VERIO test strip CHECK SUGAR ONCE A WEEK  12  . potassium chloride SA (K-DUR,KLOR-CON) 20 MEQ tablet Take 1 tablet (20 mEq total) by mouth daily. For one week. 7 tablet 0  . traMADol (ULTRAM) 50 MG tablet Take 50 mg by mouth every 4-6 hours PRN severe pain. 30 tablet 0  . warfarin (COUMADIN) 2 MG tablet Take 1 tablet (2 mg total) by mouth daily at 6 PM. Or as directed. 30 tablet 1   No current facility-administered medications for this visit.     Physical Exam:  BP 134/74   Pulse (!) 114   Ht 5\' 6"  (1.676 m)   Wt 136 lb (61.7 kg)   BMI 21.95 kg/m   Gen: no apparent distress Heart: RRR, paced Lungs: diminished on right, diminished left base  FTD:DUKGURK edema Incision: well healed  Diagnostic Tests:  CXR: moderate right sided pleural effusion, small but increasing left pleural effusion   A/P:  1. S/P MV Replacement- Sinus Tachycardia today, PPM in place.. Due to have wound check today at Dr. Bonita Quin office 2. INR 1.1 on 10/19, not much of response however being monitored by Coumadin clinic 3. Moderate Right Pleural effusion- this is likely source of  patients symptoms of shortness of breath, fatigue, and weakness.. Will hold Coumadin for 3 days prior and refer for thoracentesis 4. Left pleural effusion- no intervention currently, but if continues to increase may need to be drained 5. RTC in 2 weeks with repeat CXR   Ellwood Handler, PA-C [facility] [phone]

## 2017-10-03 LAB — CUP PACEART INCLINIC DEVICE CHECK
Battery Remaining Longevity: 128 mo
Battery Voltage: 3.2 V
Brady Statistic AP VP Percent: 0.01 %
Brady Statistic AP VS Percent: 0 %
Brady Statistic AS VP Percent: 99.94 %
Brady Statistic AS VS Percent: 0.05 %
Brady Statistic RA Percent Paced: 0.01 %
Brady Statistic RV Percent Paced: 99.95 %
Date Time Interrogation Session: 20181022193631
Implantable Lead Implant Date: 20181009
Implantable Lead Implant Date: 20181009
Implantable Lead Location: 753859
Implantable Lead Location: 753860
Implantable Lead Model: 5076
Implantable Lead Model: 5076
Implantable Pulse Generator Implant Date: 20181009
Lead Channel Impedance Value: 228 Ohm
Lead Channel Impedance Value: 342 Ohm
Lead Channel Impedance Value: 380 Ohm
Lead Channel Impedance Value: 494 Ohm
Lead Channel Pacing Threshold Amplitude: 0.75 V
Lead Channel Pacing Threshold Amplitude: 0.875 V
Lead Channel Pacing Threshold Pulse Width: 0.4 ms
Lead Channel Pacing Threshold Pulse Width: 0.4 ms
Lead Channel Sensing Intrinsic Amplitude: 2.25 mV
Lead Channel Sensing Intrinsic Amplitude: 2.625 mV
Lead Channel Sensing Intrinsic Amplitude: 8.25 mV
Lead Channel Setting Pacing Amplitude: 3.5 V
Lead Channel Setting Pacing Amplitude: 3.5 V
Lead Channel Setting Pacing Pulse Width: 0.4 ms
Lead Channel Setting Sensing Sensitivity: 2 mV

## 2017-10-03 NOTE — Progress Notes (Signed)
Wound check appointment. Steri-strips removed. Wound without redness or edema. Incision edges approximated, wound well healed. Normal device function. Thresholds, sensing, and impedances consistent with implant measurements. Device programmed at 3.5V for extra safety margin until 3 month visit. Histogram distribution appropriate for patient and level of activity. No mode switches or high ventricular rates noted. Patient educated about wound care, arm mobility, lifting restrictions. ROV 1/16 with JA

## 2017-10-05 ENCOUNTER — Ambulatory Visit (INDEPENDENT_AMBULATORY_CARE_PROVIDER_SITE_OTHER): Payer: Medicare Other | Admitting: Physician Assistant

## 2017-10-05 ENCOUNTER — Ambulatory Visit: Payer: Medicare Other | Admitting: Physician Assistant

## 2017-10-05 ENCOUNTER — Other Ambulatory Visit: Payer: Self-pay

## 2017-10-05 ENCOUNTER — Ambulatory Visit (INDEPENDENT_AMBULATORY_CARE_PROVIDER_SITE_OTHER): Payer: Medicare Other | Admitting: Pharmacist Clinician (PhC)/ Clinical Pharmacy Specialist

## 2017-10-05 VITALS — BP 108/68 | HR 106 | Ht 66.0 in | Wt 136.0 lb

## 2017-10-05 DIAGNOSIS — I442 Atrioventricular block, complete: Secondary | ICD-10-CM

## 2017-10-05 DIAGNOSIS — I471 Supraventricular tachycardia: Secondary | ICD-10-CM | POA: Diagnosis not present

## 2017-10-05 DIAGNOSIS — Z7901 Long term (current) use of anticoagulants: Secondary | ICD-10-CM

## 2017-10-05 DIAGNOSIS — I4892 Unspecified atrial flutter: Secondary | ICD-10-CM | POA: Diagnosis not present

## 2017-10-05 DIAGNOSIS — Z953 Presence of xenogenic heart valve: Secondary | ICD-10-CM

## 2017-10-05 DIAGNOSIS — I10 Essential (primary) hypertension: Secondary | ICD-10-CM

## 2017-10-05 DIAGNOSIS — I5032 Chronic diastolic (congestive) heart failure: Secondary | ICD-10-CM

## 2017-10-05 DIAGNOSIS — Z950 Presence of cardiac pacemaker: Secondary | ICD-10-CM

## 2017-10-05 DIAGNOSIS — I4719 Other supraventricular tachycardia: Secondary | ICD-10-CM

## 2017-10-05 DIAGNOSIS — J90 Pleural effusion, not elsewhere classified: Secondary | ICD-10-CM | POA: Diagnosis not present

## 2017-10-05 LAB — POCT INR: INR: 1.2

## 2017-10-05 NOTE — Patient Instructions (Signed)
Medication Instructions: Your physician recommends that you continue on your current medications as directed. Please refer to the Current Medication list given to you today.  Labwork: Your physician recommends that you return for lab work in: 2-3 weeks--BMET to check kidney function.   Follow-Up: We request that you follow-up in: 1 month with Almyra Deforest, PA and in 3 months with Dr. Percival Spanish.  If you need a refill on your cardiac medications before your next appointment, please call your pharmacy.   *Restart your Coumadin (Warfarin) after your procedure*

## 2017-10-05 NOTE — Progress Notes (Signed)
Cardiology Office Note    Date:  10/07/2017   ID:  Patricia [name], DOB [birth year], MRN [id number]  PCP:  Susy Frizzle, MD  Cardiologist:  Dr. Percival Spanish  Chief Complaint  Patient presents with  . Follow-up    seen for Dr. Percival Spanish    History of Present Illness:  Patricia [name] is a 66 y.o. female with PMH of severe MR s/p minimally invasive bioprosthetic MVR, atrial flutter s/p ablation 1998 Dr. Caryl Comes, COPD, Hodgkin's lymphoma s/p lap splenectomy (chemo and rad in 1974 w/ recurrent lymphoma 2 years later treated with chemo and whole body rad), breast CA s/p mastectomy 2005 and chemo and prediabetes. Echocardiogram obtained on 12/23/2016 showed EF 55-60%, grade 2 DD, moderate to severe MS and moderate severe MR and moderate TR, PA peak pressure 78 mmHg. Patient underwent a left and right heart cath on 06/27/2017 which showed no significant coronary disease but 3+ MR and severe MS. He underwent TEE on 06/29/2017 which showed EF 55-60%, moderate AI, mild-to-moderate MS with mean gradient 5-7 mmHg, moderate MR directed eccentrically. She eventually underwent minimally invasive mitral valve replacement (with Medtronic Mosaic porcine stented bioprosthetic tissue valve) by Dr. Roxy Manns on 09/13/2017. Postop course was complicated by persistent complete heart block, he was seen by Dr. Lovena Le and underwent permanent pacemaker on 09/19/2017. She does have some sinus tachycardia, device interrogation was negative for atrial fibrillation, this was treated with metoprolol and amiodarone. She was discharged on Lasix 40 mg twice a day for one week.   Since discharge, due to volume overload, her Lasix has been increased to 60 mg twice a day. She is also taking potassium supplement as well. Basic metabolic panel obtained on 09/27/2017 showed stable renal function and electrolyte. She continued to have shortness of breath with exertion, recent chest x-ray showed a moderate sized right pleural effusion and a small  left pleural effusion. She is scheduled to undergo thoracentesis tomorrow. She has been holding her Coumadin for the past 3 days, INR today was 1.2. Once she undergo thoracentesis, she will restart the Coumadin and have Coumadin clinic visit next Friday. She is still tachycardic on physical exam, however no significant heart murmur. I will continue her on the current medication, unable to up titrate metoprolol due to borderline BP. Hopefully after thoracentesis, her heart rate will improve. However if still tachycardic on the next follow-up, I plan to either up titrate beta blocker or consider addition of Corlanor    Past Medical History:  Diagnosis Date  . Atrial flutter (HCC)    Ablated 1998 Dr. Caryl Comes  . Bleeding gastric ulcer    back in 2000  . Breast cancer ([place])   . COPD (chronic obstructive pulmonary disease) (HCC)    from radiation  . GERD (gastroesophageal reflux disease)   . Hodgkin's lymphoma ([place])    Treated with radiation and chemo  . Mitral regurgitation    pvc  . Osteoporosis    lumbar verterbral fracture, left ankle fracture, dexa 2015  . Pancreatitis   . Pneumonia   . Prediabetes   . 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 Ulcer     Past Surgical History:  Procedure Laterality Date  . BREAST SURGERY     breast cancer since 2005   left mastectomy  . CARDIAC ELECTROPHYSIOLOGY [place] AND ABLATION  1999  . CATARACT EXTRACTION, BILATERAL    . COLONOSCOPY    .  IR RADIOLOGY PERIPHERAL GUIDED IV START  08/03/2017  . IR US GUIDE VASC ACCESS RIGHT  08/03/2017  . LAPAROTOMY    . MASTECTOMY  2005   Left-axillary  . MITRAL VALVE REPAIR Right 09/13/2017   Procedure: MINIMALLY INVASIVE MITRAL VALVE REPLACEMENT (MVR) with Medtronic Mosaic Porcine Heart Valve size 67mm;  Surgeon: Rexene Alberts, MD;  Location: [facility];  Service: Open Heart Surgery;  Laterality:  Right;  . ORIF ANKLE FRACTURE Left 03/21/2014   Procedure: LEFT ANKLE FRACTURE OPEN TREATMENT BILMALLEOLAR INCLUDES INTERNAL FIXATION ;  Surgeon: Renette Butters, MD;  Location: [facility];  Service: Orthopedics;  Laterality: Left;  . PACEMAKER IMPLANT N/A 09/19/2017   Procedure: PACEMAKER IMPLANT;  Surgeon: Thompson Grayer, MD;  Location: [facility];  Service: Cardiovascular;  Laterality: N/A;  . RIGHT/LEFT HEART CATH AND CORONARY ANGIOGRAPHY N/A 06/27/2017   Procedure: Right/Left Heart Cath and Coronary Angiography;  Surgeon: Larey [name], MD;  Location: [facility];  Service: Cardiovascular;  Laterality: N/A;  . 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 TEE WITHOUT CARDIOVERSION  06/12/2012   Procedure: TRANSESOPHAGEAL ECHOCARDIOGRAM (TEE);  Surgeon: Larey [name], MD;  Location: [facility];  Service: Cardiovascular;  Laterality: N/A;  . TEE WITHOUT CARDIOVERSION N/A 06/29/2017   Procedure: TRANSESOPHAGEAL ECHOCARDIOGRAM (TEE);  Surgeon: Jerline Pain, MD;  Location: [facility];  Service: Cardiovascular;  Laterality: N/A;  . TEE WITHOUT CARDIOVERSION N/A 09/13/2017   Procedure: TRANSESOPHAGEAL ECHOCARDIOGRAM (TEE);  Surgeon: Rexene Alberts, MD;  Location: [facility];  Service: Open Heart Surgery;  Laterality: N/A;  . TONSILLECTOMY AND ADENOIDECTOMY      Current Medications: Outpatient Medications Prior to Visit  Medication Sig Dispense Refill  . potassium chloride SA (K-DUR,KLOR-CON) 20 MEQ tablet Take 1 tablet (20 mEq total) by mouth daily. For one week. 7 tablet 0  . acetaminophen (TYLENOL) 325 MG tablet Take 2 tablets (650 mg total) by mouth 2 (two) times daily as needed for moderate pain or headache.    Marland Kitchen amiodarone (PACERONE) 200 MG tablet Take 1 tablet (200 mg total) by mouth daily. 30 tablet 1  . aspirin EC 81 MG tablet Take 81 mg by mouth 3 (three) times a week.    . Calcium Carbonate-Vitamin D (CALCIUM PLUS VITAMIN D PO) Take 2 tablets by mouth daily.     . cetirizine (ZYRTEC) 10 MG tablet Take 10 mg by mouth daily as needed for allergies.    Marland Kitchen esomeprazole (NEXIUM) 40 MG capsule Take 40 mg by mouth daily before breakfast.   12  . furosemide (LASIX) 40 MG tablet Take 1.5 tablets (60 mg total) by mouth 2 (two) times daily. For week then stop. 21 tablet 0  . latanoprost (XALATAN) 0.005 % ophthalmic solution Place 1 drop into both eyes at bedtime.     . lipase/protease/amylase (CREON) 12000 UNITS CPEP capsule Take 1 capsule (12,000 Units total) by mouth 3 (three) times daily with meals. (Patient taking differently: Take 12,000 Units by mouth 3 (three) times daily before meals. ) 270 capsule 1  . metoprolol (LOPRESSOR) 50 MG tablet TAKE 1 TABLET (50 MG TOTAL) BY MOUTH 2 (TWO) TIMES DAILY. 180 tablet 3  . Multiple Vitamin (MULTIVITAMIN WITH MINERALS) TABS tablet Take 1 tablet by mouth daily. Centrum Silver.    Glory Rosebush DELICA LANCETS 85Y MISC TEST SUGAR ONCE A WEEK  4  . ONETOUCH VERIO test strip CHECK SUGAR ONCE A WEEK  12  . traMADol (ULTRAM)  50 MG tablet Take 50 mg by mouth every 4-6 hours PRN severe pain. 30 tablet 0  . warfarin (COUMADIN) 2 MG tablet Take 1 tablet (2 mg total) by mouth daily at 6 PM. Or as directed. 30 tablet 1   No facility-administered medications prior to visit.      Allergies:   Azithromycin; Penicillins; Sulfa antibiotics; and Cheese   Social History   Social History  . Marital status: Widowed    Spouse name: N/A  . Number of children: N/A  . Years of education: N/A   Occupational History  . Teacher (retired)    Social History Main Topics  . Smoking status: Never Smoker  . Smokeless tobacco: Never Used  . Alcohol use No  . Drug use: No  . Sexual activity: Yes   Other Topics Concern  . None   Social History Narrative   Lives at home with husband.     Family History:  The patient's family history includes Arthritis in her maternal grandmother; Cancer in her father; Diabetes in her father and paternal  grandmother; Heart disease in her father; Hypertension in her maternal grandmother; Rheumatic fever in her father; Vision loss in her paternal grandmother.   ROS:   Please see the history of present illness.    ROS All other systems reviewed and are negative.   PHYSICAL EXAM:   VS:  BP 108/68   Pulse (!) 106   Ht 5\' 6"  (1.676 m)   Wt 136 lb (61.7 kg)   BMI 21.95 kg/m    GEN: Well nourished, well developed, in no acute distress  HEENT: normal  Neck: no JVD, carotid bruits, or masses Cardiac: Tachycardic; no murmurs, rubs, or gallops,no edema  Respiratory:  Markedly diminished breath sounds in the right base, mildly diminished breath sounds in the left base GI: soft, nontender, nondistended, + BS MS: no deformity or atrophy  Skin: warm and dry, no rash Neuro:  Alert and Oriented x 3, Strength and sensation are intact Psych: euthymic mood, full affect  Wt Readings from Last 3 Encounters:  10/05/17 136 lb (61.7 kg)  10/02/17 136 lb (61.7 kg)  09/27/17 140 lb (63.5 kg)      Studies/Labs Reviewed:   EKG:  EKG is ordered today.  The ekg ordered today demonstrates Sinus tachycardia, heart rate 106  Recent Labs: 09/11/2017: ALT 22 09/14/2017: Magnesium 2.4 09/27/2017: BUN 17; Creatinine, Ser 1.21; Hemoglobin 9.5; Platelets 590; Potassium 4.5; Sodium 134; TSH 4.530   Lipid Panel    Component Value Date/Time   CHOL 183 07/07/2016 0847   TRIG 153 (H) 07/07/2016 0847   HDL 53 07/07/2016 0847   CHOLHDL 3.5 07/07/2016 0847   VLDL 31 (H) 07/07/2016 0847   LDLCALC 99 07/07/2016 0847    Additional studies/ records that were reviewed today include:   Echo 12/23/2016 LV EF: 55% -   60%  Study Conclusions  - Left ventricle: The cavity size was normal. There was mild focal   basal hypertrophy of the septum. Systolic function was normal.   The estimated ejection fraction was in the range of 55% to 60%.   Wall motion was normal; there were no regional wall motion   abnormalities.  Features are consistent with a pseudonormal left   ventricular filling pattern, with concomitant abnormal relaxation   and increased filling pressure (grade 2 diastolic dysfunction).   Doppler parameters are consistent with elevated ventricular   end-diastolic filling pressure. - Aortic valve: There was moderate regurgitation. -  Mitral valve: Calcified annulus. Mildly thickened leaflets . The   findings are consistent with severe stenosis. There was moderate   to severe regurgitation directed posteriorly. - Left atrium: The atrium was mildly dilated. - Right atrium: The atrium was normal in size. - Tricuspid valve: There was moderate regurgitation. - Pulmonary arteries: Systolic pressure was severely increased. PA   peak pressure: 78 mm Hg (S). - Inferior vena cava: The vessel was dilated. The respirophasic   diameter changes were blunted (< 50%), consistent with elevated   central venous pressure. - Pericardium, extracardiac: A trivial pericardial effusion was   identified posterior to the heart.  Impressions:  - LVEF is preserved.   Grade 2 diastolic dysfunction with severely elevated filling   pressures.   Severe mitral stenosis.   MR jet posteriorly directed with systolic blunting of the   pulmonary vein forward flow consistent with moderate to severe   mitral regurgitation.   Severe pulmonary hypertension with RVSP 78 mmHg, previously 51   mmHg.   Aortic regurgitation jet not significant on this study but   moderate based on PHT.     Further evaluation of the aortic na dmitral valve by TEE is   recommended.   Cath 06/27/2017 Conclusion     There is moderate (3+) mitral regurgitation.   1. No significant coronary disease.  2. Normal LV systolic function.  3. 3+ mitral regurgitation.  4. Severe mitral stenosis.  5. No more than mild aortic stenosis.  6. Mildly elevated PCWP with near-normal LVEDP.  7. Moderate pulmonary hypertension, appears to be mixed  pulmonary venous hypertension and pulmonary arterial hypertension (may be component of reactive pulmonary arterial changes in the setting long-standing mitral valve disease).  8. Root shot not done to assess aortic insufficiency, will have TEE on Thursday.      TEE 06/29/2017 LV EF: 55% -   60%  Study Conclusions  - Left ventricle: The cavity size was normal. Wall thickness was   normal. Systolic function was normal. The estimated ejection   fraction was in the range of 55% to 60%. Wall motion was normal;   there were no regional wall motion abnormalities. - Aortic valve: Mildly thickened, mildly calcified leaflets. No   evidence of vegetation. There was mild to moderate regurgitation.   Vena contracta 0.27cm2. - Mitral valve: No evidence of vegetation. The findings are   consistent with mild to moderate stenosis. MV mean gradient   5-64mmHg, pressure half time 78- valve area 2.8cm2, Planimetry -   2.3cm2   There was moderate regurgitation directed eccentrically. PISA   0.5cm, ERO 0.1cm2, MR volume 70ml, no pulmonary vein reversal. - Left atrium: No evidence of thrombus in the atrial cavity or   appendage. No evidence of thrombus in the appendage. - Atrial septum: Very few bubbles /delayed appearance of agitated   saline contrast in the LA suggestive of possible transpulmonary   passage or shunt. No significant PFO or ASD. - Tricuspid valve: No evidence of vegetation. - Pulmonic valve: No evidence of vegetation. - Superior vena cava: The study excluded a thrombus.     TEE 09/13/2017 Result status: Final result   Left ventricle: Normal cavity size and wall thickness. The LV cavity measured 4.4 cm at end-diastole and 2.9 cm at end-systole in the transgastric short axis view at the mid-papillary level. There was normal wall thickness. There was normal LV systolic function with the ejection fraction estimated at 60-65%. In the post-bypass exam, there was ventricular dyssynchrony  consistent with ventricular pacing. The ejection fraction was estimated at 50-55%.  Septum: No Patent Foramen Ovale present.  Left atrium: Patent foramen ovale not present.  Left atrium: Cavity is mildly dilated. No spontaneous echo contrast.  Aortic valve: The valve is trileaflet. Moderate valve thickening present. Moderate valve calcification present. Mild to moderate regurgitation.  Pulmonic valve: Trace regurgitation.  Right ventricle: In the pre-bypass exam, the right ventricular cavity was of normal size with mildly reduced systolic function especially in the infundibulum. There was no interventricular septal flattening. In the post-bypass period, there was modearte RV systolic dysfunction. There was decreased contractility of the RV free wall, but no interventricular septal flattening.  Tricuspid valve: The tricuspid valve appeared appeared structurally normal with mild tricuspid insufficiency.  Mitral valve: There was marked thickening of the mitral leaflets with severe calcification at the base of the anterior leaflet. There was no mitral stenosis. The mean transmitral gradient was 4 mm hg. The mitral valve area by pressure halftime was 3.28 cm2. There was moderate to severe mitral regurgitation with a central and posteriorly directed jet which wrapped along the posterior left atrial wall. The vena contracta measured 0.65 cm2. There was blunted systolic flow in the left and right up      ASSESSMENT:    1. Atrial tachycardia ([place])   2. Essential hypertension   3. Pleural effusion   4. S/P mitral valve replacement with bioprosthetic valve   5. Atrial flutter, unspecified type ([place])   6. CHB (complete heart block) (HCC)   7. Pacemaker   8. Chronic diastolic heart failure (HCC)      PLAN:  In order of problems listed above:  1. Atrial tachycardia: Likely due to moderate pleural effusion and baseline shortness of breath. Likely will improve after thoracentesis, however if  continue to have tachycardia, will consider beta blocker or Corlanor  2. Chronic diastolic heart failure: Will continue on the current dose of diuretic. Repeat basic metabolic panel in 2-3 weeks.  3. Bilateral pleural effusion: Right > Left. Plan for thoracentesis tomorrow  4. Mitral stenosis s/p mitral valve replacement with bioprosthetic valve: No significant heart murmur on physical exam  5. CHB s/p PPM: Follow-up with Dr. Rayann Heman  6. Atrial flutter s/p ablation: On Coumadin    Medication Adjustments/Labs and Tests Ordered: Current medicines are reviewed at length with the patient today.  Concerns regarding medicines are outlined above.  Medication changes, Labs and Tests ordered today are listed in the Patient Instructions below. Patient Instructions  Medication Instructions: Your physician recommends that you continue on your current medications as directed. Please refer to the Current Medication list given to you today.  Labwork: Your physician recommends that you return for lab work in: 2-3 weeks--BMET to check kidney function.   Follow-Up: We request that you follow-up in: 1 month with Almyra Deforest, PA and in 3 months with Dr. Percival Spanish.  If you need a refill on your cardiac medications before your next appointment, please call your pharmacy.   *Restart your Coumadin (Warfarin) after your procedure*    Weston Brass Almyra Deforest, PA  10/07/2017 12:14 PM    [facility] [address], [place], [place]  [address] Phone: [phone]; Fax: [phone]

## 2017-10-06 ENCOUNTER — Ambulatory Visit (HOSPITAL_COMMUNITY)
Admission: RE | Admit: 2017-10-06 | Discharge: 2017-10-06 | Disposition: A | Discharge status: Home / Self Care | Payer: Medicare Other | Source: Ambulatory Visit | Attending: Radiology | Admitting: Radiology

## 2017-10-06 ENCOUNTER — Ambulatory Visit (HOSPITAL_COMMUNITY)
Admission: RE | Admit: 2017-10-06 | Discharge: 2017-10-06 | Disposition: A | Discharge status: Home / Self Care | Payer: Medicare Other | Source: Ambulatory Visit | Attending: Physician Assistant | Admitting: Physician Assistant

## 2017-10-06 DIAGNOSIS — Z952 Presence of prosthetic heart valve: Secondary | ICD-10-CM | POA: Diagnosis not present

## 2017-10-06 DIAGNOSIS — J90 Pleural effusion, not elsewhere classified: Secondary | ICD-10-CM | POA: Diagnosis present

## 2017-10-06 MED ORDER — LIDOCAINE HCL 2 % IJ SOLN
INTRAMUSCULAR | Status: AC
  Filled 2017-10-06: qty 10

## 2017-10-06 NOTE — Procedures (Signed)
PROCEDURE SUMMARY:  Successful US guided right thoracentesis. Yielded 800 mL of clear, amber colored fluid. Pt tolerated procedure well. No immediate complications.  Specimen was not sent for labs. CXR ordered.  Ascencion Dike PA-C 10/06/2017 10:45 AM

## 2017-10-07 ENCOUNTER — Encounter: Payer: Self-pay | Admitting: Physician Assistant

## 2017-10-09 ENCOUNTER — Other Ambulatory Visit: Payer: Self-pay | Admitting: Pharmacist

## 2017-10-09 MED ORDER — FUROSEMIDE 40 MG PO TABS: 60.0000 mg | ORAL_TABLET | Freq: Two times a day (BID) | ORAL | 0 refills | Status: DC

## 2017-10-09 MED ORDER — POTASSIUM CHLORIDE CRYS ER 20 MEQ PO TBCR: 20.0000 meq | EXTENDED_RELEASE_TABLET | Freq: Every day | ORAL | 0 refills | Status: DC

## 2017-10-09 NOTE — Telephone Encounter (Signed)
F/U appointment on 10/25. Per note patient was to continue current lasix dose and potassium dose but no Rx sent to pharmacy.   Will send Rx for 4 week ; no refill  Patient is to repeat BMET in 2 weeks and f/u with APP in 4 weeks.

## 2017-10-10 ENCOUNTER — Ambulatory Visit (INDEPENDENT_AMBULATORY_CARE_PROVIDER_SITE_OTHER): Payer: Self-pay | Admitting: *Deleted

## 2017-10-10 DIAGNOSIS — Z950 Presence of cardiac pacemaker: Secondary | ICD-10-CM

## 2017-10-12 NOTE — Progress Notes (Signed)
Remote pacemaker transmission.   

## 2017-10-13 ENCOUNTER — Ambulatory Visit (INDEPENDENT_AMBULATORY_CARE_PROVIDER_SITE_OTHER): Payer: Medicare Other | Admitting: Pharmacist Clinician (PhC)/ Clinical Pharmacy Specialist

## 2017-10-13 ENCOUNTER — Other Ambulatory Visit: Payer: Self-pay | Admitting: Thoracic Surgery (Cardiothoracic Vascular Surgery)

## 2017-10-13 ENCOUNTER — Other Ambulatory Visit: Payer: Self-pay | Admitting: Pharmacist Clinician (PhC)/ Clinical Pharmacy Specialist

## 2017-10-13 DIAGNOSIS — Z7901 Long term (current) use of anticoagulants: Secondary | ICD-10-CM | POA: Diagnosis not present

## 2017-10-13 DIAGNOSIS — Z953 Presence of xenogenic heart valve: Secondary | ICD-10-CM

## 2017-10-13 DIAGNOSIS — Q8789 Other specified congenital malformation syndromes, not elsewhere classified: Secondary | ICD-10-CM

## 2017-10-13 LAB — CUP PACEART REMOTE DEVICE CHECK
Battery Remaining Longevity: 127 mo
Battery Voltage: 3.19 V
Brady Statistic AP VP Percent: 0 %
Brady Statistic AP VS Percent: 0 %
Brady Statistic AS VP Percent: 99.98 %
Brady Statistic AS VS Percent: 0.02 %
Brady Statistic RA Percent Paced: 0 %
Brady Statistic RV Percent Paced: 99.98 %
Date Time Interrogation Session: 20181030185013
Implantable Lead Implant Date: 20181009
Implantable Lead Implant Date: 20181009
Implantable Lead Location: 753859
Implantable Lead Location: 753860
Implantable Lead Model: 5076
Implantable Lead Model: 5076
Implantable Pulse Generator Implant Date: 20181009
Lead Channel Impedance Value: 228 Ohm
Lead Channel Impedance Value: 342 Ohm
Lead Channel Impedance Value: 399 Ohm
Lead Channel Impedance Value: 494 Ohm
Lead Channel Pacing Threshold Amplitude: 0.75 V
Lead Channel Pacing Threshold Amplitude: 0.875 V
Lead Channel Pacing Threshold Pulse Width: 0.4 ms
Lead Channel Pacing Threshold Pulse Width: 0.4 ms
Lead Channel Sensing Intrinsic Amplitude: 2.625 mV
Lead Channel Sensing Intrinsic Amplitude: 2.625 mV
Lead Channel Sensing Intrinsic Amplitude: 8.25 mV
Lead Channel Setting Pacing Amplitude: 3.5 V
Lead Channel Setting Pacing Amplitude: 3.5 V
Lead Channel Setting Pacing Pulse Width: 0.4 ms
Lead Channel Setting Sensing Sensitivity: 2 mV

## 2017-10-13 LAB — POCT INR: INR: 1.2

## 2017-10-13 MED ORDER — WARFARIN SODIUM 5 MG PO TABS: ORAL_TABLET | ORAL | 5 refills | Status: DC

## 2017-10-16 ENCOUNTER — Ambulatory Visit (INDEPENDENT_AMBULATORY_CARE_PROVIDER_SITE_OTHER): Payer: Self-pay | Admitting: Thoracic Surgery (Cardiothoracic Vascular Surgery)

## 2017-10-16 ENCOUNTER — Encounter: Payer: Self-pay | Admitting: Thoracic Surgery (Cardiothoracic Vascular Surgery)

## 2017-10-16 ENCOUNTER — Ambulatory Visit
Admission: RE | Admit: 2017-10-16 | Discharge: 2017-10-16 | Disposition: A | Discharge status: Home / Self Care | Payer: Medicare Other | Source: Ambulatory Visit | Attending: Thoracic Surgery (Cardiothoracic Vascular Surgery) | Admitting: Thoracic Surgery (Cardiothoracic Vascular Surgery)

## 2017-10-16 VITALS — BP 133/73 | HR 108 | Ht 66.0 in | Wt 125.0 lb

## 2017-10-16 DIAGNOSIS — J9601 Acute respiratory failure with hypoxia: Secondary | ICD-10-CM

## 2017-10-16 DIAGNOSIS — Q8789 Other specified congenital malformation syndromes, not elsewhere classified: Secondary | ICD-10-CM

## 2017-10-16 DIAGNOSIS — Z953 Presence of xenogenic heart valve: Secondary | ICD-10-CM

## 2017-10-16 MED ORDER — FUROSEMIDE 40 MG PO TABS: 60.0000 mg | ORAL_TABLET | Freq: Every day | ORAL | 0 refills | Status: DC

## 2017-10-16 NOTE — Progress Notes (Signed)
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  She has otherwise recovered uneventfully and she was last seen here in our office on October 02, 2017.  Her Lasix dose was increased due to volume overload and she was noted to have a moderate size right pleural effusion.  She subsequently underwent needle thoracentesis yielding approximately 1 L of serous fluid.  Since then she has felt much better.  She returns to our office today and reports that she is doing very well.  Her weight is now down to her preoperative baseline and her lower extremity edema has resolved.  She does not have any significant pain or soreness in her chest.  Her breathing is quite good.  She states that she feels a bit weak and shaky, and she wonders if she may be getting a bit dehydrated.  She remains on both amiodarone and Coumadin.  She has not had any tachypalpitations.  Her prothrombin time remains subtherapeutic.  Her Coumadin dose is being adjusted through the Coumadin  clinic.   Current Outpatient Medications  Medication Sig Dispense Refill  . acetaminophen (TYLENOL) 325 MG tablet Take 2 tablets (650 mg total) by mouth 2 (two) times daily as needed for moderate pain or headache.    Marland Kitchen amiodarone (PACERONE) 200 MG tablet Take 1 tablet (200 mg total) by mouth daily. 30 tablet 1  . Calcium Carbonate-Vitamin D (CALCIUM PLUS VITAMIN D PO) Take 2 tablets by mouth daily.    . cetirizine (ZYRTEC) 10 MG tablet Take 10 mg by mouth daily as needed for allergies.    Marland Kitchen esomeprazole (NEXIUM) 40 MG capsule Take 40 mg by mouth daily before breakfast.   12  . furosemide (LASIX) 40 MG tablet Take 1.5 tablets (60 mg total) by mouth 2 (two) times daily. 90 tablet 0  . latanoprost (XALATAN) 0.005 % ophthalmic solution Place 1 drop into both eyes at bedtime.     . lipase/protease/amylase (CREON) 12000 UNITS CPEP capsule Take 1 capsule (12,000 Units total) by mouth 3 (three) times daily with meals. (Patient taking differently: Take 12,000 Units by mouth 3 (three) times daily before meals. ) 270 capsule 1  . metoprolol (LOPRESSOR) 50 MG tablet TAKE 1 TABLET (50 MG TOTAL) BY MOUTH 2 (TWO) TIMES DAILY. 180 tablet 3  . Multiple Vitamin (MULTIVITAMIN WITH MINERALS) TABS tablet Take 1 tablet by mouth daily. Centrum Silver.    Glory Rosebush DELICA LANCETS 67H MISC TEST SUGAR ONCE A WEEK  4  . ONETOUCH VERIO test strip CHECK SUGAR ONCE A WEEK  12  . potassium chloride SA (K-DUR,KLOR-CON)  20 MEQ tablet Take 1 tablet (20 mEq total) by mouth daily. 30 tablet 0  . traMADol (ULTRAM) 50 MG tablet Take 50 mg by mouth every 4-6 hours PRN severe pain. 30 tablet 0  . warfarin (COUMADIN) 5 MG tablet Take 1 tablet by mouth daily or as directed by coumadin clinic 30 tablet 5  . aspirin EC 81 MG tablet Take 81 mg by mouth 3 (three) times a week.     No current facility-administered medications for this visit.       Physical Exam:   BP 133/73   Pulse (!) 108   Ht 5\' 6"  (1.676 m)   Wt 125 lb (56.7  kg)   SpO2 98%   BMI 20.18 kg/m   General:  Well-appearing  Chest:   Clear to auscultation  CV:   Regular rate and rhythm although somewhat tachycardic  Incisions:  Healing nicely, skin sutures are removed  Abdomen:  Soft nontender  Extremities:  Warm and well perfused, no lower extremity edema  Diagnostic Tests:  CHEST  2 VIEW  COMPARISON:  October 06, 2017  FINDINGS: There has been interval clearing of left pleural effusion. There remains right pleural effusion with fluid tracking into the region of the right minor fissure. There is patchy atelectasis throughout the right mid lower lung zones, stable. There is mild scarring in each upper lobe. Left lung is otherwise clear.  Heart size and pulmonary vascularity are normal. Patient is status post mitral valve replacement as well as pacemaker placement. Pacemaker leads are attached to right atrium and right ventricle. No adenopathy. There is aortic atherosclerosis. There is postoperative change in the upper abdomen. No bone lesions are evident.  IMPRESSION: Areas of pleural effusion on the right remain stable. Note that there is fluid tracking into the right minor fissure. There has been interval resolution of left pleural effusion.  Areas of patchy atelectasis noted on the right. Mild scarring noted in each upper lobe near the respective apex.  There is aortic atherosclerosis. Heart size is normal. Pacemaker leads attached to right atrium and right ventricle. Patient is status post mitral valve replacement.  Aortic Atherosclerosis (ICD10-I70.0).   Electronically Signed   By: Lowella Grip III M.D.   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  Her surgery was complicated by the development of complete heart block requiring permanent pacemaker placement.  Since hospital  discharge she underwent right thoracentesis for moderate sized pleural effusion.  She seems to be doing quite well at this point in time.  Her chest x-ray looks good.  Her congestive heart failure has improved considerably and her weight is now at or below baseline.  Plan:  I have instructed the patient to decrease her dose of Lasix to 60 mg once a day.  She will continue to monitor her weight on a daily basis.  At some point it may be reasonable to stop amiodarone at some point in the near future, although I would defer this decision to Dr. Rayann Heman and/or Dr. Percival Spanish so that they can interrogate the patient's pacemaker and see whether or not she is having any atrial dysrhythmias.  She remains somewhat tachycardic at this time.  I am currently reluctant to increase her dose of metoprolol any further until she can be seen in the pacemaker clinic and her pacemaker interrogated.  I have encouraged the patient to continue  to gradually increase her physical activity as tolerated.  I think she would benefit from participation in the outpatient cardiac rehab program.  She may resume driving an automobile.  The patient will return to our office in approximately 2 months to make sure that she continues to recover uneventfully.  She will call and return sooner should specific problems or questions arise.    Valentina Gu. Roxy Manns, MD 10/16/2017 1:57 PM

## 2017-10-16 NOTE — Patient Instructions (Addendum)
Decrease lasix to once/daily  Continue all other previous medications without any changes at this time  You may continue to gradually increase your physical activity as tolerated.  Refrain from any heavy lifting or strenuous use of your arms and shoulders until at least 8 weeks from the time of your surgery, and avoid activities that cause increased pain in your chest on the side of your surgical incision.  Otherwise you may continue to increase activities without any particular limitations.  Increase the intensity and duration of physical activity gradually.  You may return to driving an automobile as long as you are no longer requiring oral narcotic pain relievers during the daytime.  It would be wise to start driving only short distances during the daylight and gradually increase from there as you feel comfortable.  You are encouraged to enroll and participate in the outpatient cardiac rehab program beginning as soon as practical.

## 2017-10-17 NOTE — Telephone Encounter (Signed)
Called patient in regards to Cardiac Rehab - Patient is interested in the program. Scheduled orientation on 10/31/2017 at 8:30am. Patient will attend the 9:45am exc class.

## 2017-10-18 ENCOUNTER — Telehealth (HOSPITAL_COMMUNITY): Payer: Self-pay | Admitting: *Deleted

## 2017-10-18 ENCOUNTER — Encounter: Payer: Self-pay | Admitting: Cardiology

## 2017-10-18 NOTE — Telephone Encounter (Signed)
-----   Message from Thompson Grayer, MD sent at 10/17/2017 10:15 PM EST ----- Regarding: RE: Madaline Brilliant to participate in cardiac rehab [place] to participate without restriction from my standpoint. Thanks!  ----- Message ----- From: Rowe Pavy, RN Sent: 10/17/2017   2:46 PM To: Thompson Grayer, MD Subject: Ok to participate in cardiac rehab             Dr. Rayann Heman,  The above patient is referred to cardiac rehab s/p Mini MVR on 09/13/17.  Pt had PPM placed on 10/9 for CHB.  Pt seen in the device clinic on 10/22 for wound check.  Pt is tentatively scheduled for 11/20 orientation and would begin full exercise on 11/26. May pt participate in group exercise at cardiac rehab?   At this point in her recovery, any restrictions of exercise/activity? Weight limitations?  Thank you for the advisement Maurice Small RN, BSN Cardiac and Pulmonary Rehab Nurse Navigator

## 2017-10-20 ENCOUNTER — Ambulatory Visit (INDEPENDENT_AMBULATORY_CARE_PROVIDER_SITE_OTHER): Payer: Medicare Other | Admitting: Pharmacist

## 2017-10-20 DIAGNOSIS — Z953 Presence of xenogenic heart valve: Secondary | ICD-10-CM | POA: Diagnosis not present

## 2017-10-20 DIAGNOSIS — Z7901 Long term (current) use of anticoagulants: Secondary | ICD-10-CM | POA: Diagnosis not present

## 2017-10-20 LAB — POCT INR: INR: 1.3

## 2017-10-21 LAB — BASIC METABOLIC PANEL
BUN/Creatinine Ratio: 19 (ref 12–28)
BUN: 28 mg/dL — ABNORMAL HIGH (ref 8–27)
CO2: 29 mmol/L (ref 20–29)
Calcium: 9.7 mg/dL (ref 8.7–10.3)
Chloride: 95 mmol/L — ABNORMAL LOW (ref 96–106)
Creatinine, Ser: 1.47 mg/dL — ABNORMAL HIGH (ref 0.57–1.00)
GFR calc Af Amer: 43 mL/min/{1.73_m2} — ABNORMAL LOW (ref >59)
GFR calc non Af Amer: 37 mL/min/{1.73_m2} — ABNORMAL LOW (ref >59)
Glucose: 112 mg/dL — ABNORMAL HIGH (ref 65–99)
Potassium: 4 mmol/L (ref 3.5–5.2)
Sodium: 141 mmol/L (ref 134–144)

## 2017-10-23 ENCOUNTER — Telehealth: Payer: Self-pay

## 2017-10-23 DIAGNOSIS — I471 Supraventricular tachycardia: Secondary | ICD-10-CM

## 2017-10-23 MED ORDER — FUROSEMIDE 40 MG PO TABS: 40.0000 mg | ORAL_TABLET | Freq: Every day | ORAL | 0 refills | Status: DC

## 2017-10-23 NOTE — Telephone Encounter (Signed)
-----   Message from [place], [place] sent at 10/23/2017  2:53 PM EST ----- Kidney function continue to worsen despite the fact Dr. Roxy Manns recently cut her lasix back to 60mg  daily. Recommend cut lasix back to 40mg  daily. Continue on current dose of 41meq KCl and recheck BMET in 1 week

## 2017-10-23 NOTE — Telephone Encounter (Signed)
Spoke with patient informed her that Isaac Laud wanted her to decrease her Lasix to 40mg  qd, continue her Potassium and have a repeat lab(BMET) in one week; patient voiced understanding.

## 2017-10-24 ENCOUNTER — Telehealth: Payer: Self-pay | Admitting: Cardiology

## 2017-10-24 ENCOUNTER — Ambulatory Visit (INDEPENDENT_AMBULATORY_CARE_PROVIDER_SITE_OTHER): Payer: Medicare Other | Admitting: *Deleted

## 2017-10-24 DIAGNOSIS — I442 Atrioventricular block, complete: Secondary | ICD-10-CM | POA: Diagnosis not present

## 2017-10-24 NOTE — Telephone Encounter (Signed)
Spoke with pt and reminded pt of remote transmission that is due today. Pt verbalized understanding.   

## 2017-10-24 NOTE — Progress Notes (Signed)
Remote pacemaker transmission.   

## 2017-10-25 LAB — CUP PACEART REMOTE DEVICE CHECK
Battery Remaining Longevity: 126 mo
Battery Voltage: 3.18 V
Brady Statistic AP VP Percent: 0 %
Brady Statistic AP VS Percent: 0 %
Brady Statistic AS VP Percent: 99.99 %
Brady Statistic AS VS Percent: 0.01 %
Brady Statistic RA Percent Paced: 0 %
Brady Statistic RV Percent Paced: 99.99 %
Date Time Interrogation Session: 20181113161124
Implantable Lead Implant Date: 20181009
Implantable Lead Implant Date: 20181009
Implantable Lead Location: 753859
Implantable Lead Location: 753860
Implantable Lead Model: 5076
Implantable Lead Model: 5076
Implantable Pulse Generator Implant Date: 20181009
Lead Channel Impedance Value: 228 Ohm
Lead Channel Impedance Value: 361 Ohm
Lead Channel Impedance Value: 361 Ohm
Lead Channel Impedance Value: 494 Ohm
Lead Channel Pacing Threshold Amplitude: 0.75 V
Lead Channel Pacing Threshold Amplitude: 0.875 V
Lead Channel Pacing Threshold Pulse Width: 0.4 ms
Lead Channel Pacing Threshold Pulse Width: 0.4 ms
Lead Channel Sensing Intrinsic Amplitude: 2.625 mV
Lead Channel Sensing Intrinsic Amplitude: 2.625 mV
Lead Channel Sensing Intrinsic Amplitude: 8.25 mV
Lead Channel Setting Pacing Amplitude: 3.5 V
Lead Channel Setting Pacing Amplitude: 3.5 V
Lead Channel Setting Pacing Pulse Width: 0.4 ms
Lead Channel Setting Sensing Sensitivity: 2 mV

## 2017-10-27 ENCOUNTER — Ambulatory Visit (INDEPENDENT_AMBULATORY_CARE_PROVIDER_SITE_OTHER): Payer: Medicare Other | Admitting: Pharmacist

## 2017-10-27 ENCOUNTER — Telehealth (HOSPITAL_COMMUNITY): Payer: Self-pay

## 2017-10-27 ENCOUNTER — Encounter: Payer: Self-pay | Admitting: Cardiology

## 2017-10-27 DIAGNOSIS — Z953 Presence of xenogenic heart valve: Secondary | ICD-10-CM | POA: Diagnosis not present

## 2017-10-27 DIAGNOSIS — Z7901 Long term (current) use of anticoagulants: Secondary | ICD-10-CM

## 2017-10-27 LAB — POCT INR: INR: 1.3

## 2017-10-27 NOTE — Telephone Encounter (Signed)
Cardiac Rehab Medication Review by a Pharmacist  Does the patient  feel that his/her medications are working for him/her?  no  Has the patient been experiencing any side effects to the medications prescribed?  no  Does the patient measure his/her own blood pressure or blood glucose at home?  yes   Does the patient have any problems obtaining medications due to transportation or finances?   no  Understanding of regimen: good Understanding of indications: good Potential of compliance: good   Pharmacist comments: Patient endorses adherence to all medications and denies any adverse side effects. She checks her blood sugar once a week, ranging 115-118. She also checks her blood pressure periodically, but does not recall her measurements.   Leroy Libman, PharmD Pharmacy Resident Pager: [phone]

## 2017-10-28 LAB — BASIC METABOLIC PANEL
BUN/Creatinine Ratio: 24 (ref 12–28)
BUN: 28 mg/dL — ABNORMAL HIGH (ref 8–27)
CO2: 25 mmol/L (ref 20–29)
Calcium: 9.5 mg/dL (ref 8.7–10.3)
Chloride: 95 mmol/L — ABNORMAL LOW (ref 96–106)
Creatinine, Ser: 1.18 mg/dL — ABNORMAL HIGH (ref 0.57–1.00)
GFR calc Af Amer: 56 mL/min/{1.73_m2} — ABNORMAL LOW (ref >59)
GFR calc non Af Amer: 48 mL/min/{1.73_m2} — ABNORMAL LOW (ref >59)
Glucose: 93 mg/dL (ref 65–99)
Potassium: 4.5 mmol/L (ref 3.5–5.2)
Sodium: 138 mmol/L (ref 134–144)

## 2017-10-30 NOTE — Telephone Encounter (Signed)
Kidney function and electrolyte back to baseline

## 2017-10-31 ENCOUNTER — Encounter (HOSPITAL_COMMUNITY)
Admission: RE | Admit: 2017-10-31 | Discharge: 2017-10-31 | Disposition: A | Discharge status: Home / Self Care | Payer: Medicare Other | Source: Ambulatory Visit | Attending: Cardiology | Admitting: Cardiology

## 2017-10-31 ENCOUNTER — Encounter (HOSPITAL_COMMUNITY): Payer: Self-pay

## 2017-10-31 VITALS — BP 122/82 | HR 86 | Ht 65.75 in | Wt 129.4 lb

## 2017-10-31 DIAGNOSIS — Z952 Presence of prosthetic heart valve: Secondary | ICD-10-CM | POA: Insufficient documentation

## 2017-10-31 NOTE — Progress Notes (Signed)
Cardiac Individual Treatment Plan  Patient Details  Name: Patricia [name] MRN: [id number] Date of Birth: 09-14-1951 Referring Provider:     [facility] from 10/31/2017 in [facility]  Referring Provider  Minus Breeding MD      Initial Encounter Date:    CARDIAC REHAB PHASE II ORIENTATION from 10/31/2017 in [facility]  Date  10/31/17  Referring Provider  Minus Breeding MD      Visit Diagnosis: S/P MVR (mitral valve replacement) 09/13/17  Patient's Home Medications on Admission:  Current Outpatient Medications:  .  acetaminophen (TYLENOL) 325 MG tablet, Take 2 tablets (650 mg total) by mouth 2 (two) times daily as needed for moderate pain or headache., Disp: , Rfl:  .  amiodarone (PACERONE) 200 MG tablet, Take 1 tablet (200 mg total) by mouth daily., Disp: 30 tablet, Rfl: 1 .  aspirin EC 81 MG tablet, Take 81 mg by mouth 3 (three) times a week., Disp: , Rfl:  .  Calcium Carbonate-Vitamin D (CALCIUM PLUS VITAMIN D PO), Take 2 tablets by mouth daily., Disp: , Rfl:  .  cetirizine (ZYRTEC) 10 MG tablet, Take 10 mg by mouth daily as needed for allergies., Disp: , Rfl:  .  esomeprazole (NEXIUM) 40 MG capsule, Take 40 mg by mouth daily before breakfast. , Disp: , Rfl: 12 .  furosemide (LASIX) 40 MG tablet, Take 1 tablet (40 mg total) daily by mouth., Disp: 90 tablet, Rfl: 0 .  latanoprost (XALATAN) 0.005 % ophthalmic solution, Place 1 drop into both eyes at bedtime. , Disp: , Rfl:  .  lipase/protease/amylase (CREON) 12000 UNITS CPEP capsule, Take 1 capsule (12,000 Units total) by mouth 3 (three) times daily with meals. (Patient taking differently: Take 12,000 Units by mouth 3 (three) times daily before meals. ), Disp: 270 capsule, Rfl: 1 .  metoprolol (LOPRESSOR) 50 MG tablet, TAKE 1 TABLET (50 MG TOTAL) BY MOUTH 2 (TWO) TIMES DAILY., Disp: 180 tablet, Rfl: 3 .  Multiple Vitamin (MULTIVITAMIN WITH  MINERALS) TABS tablet, Take 1 tablet by mouth daily. Centrum Silver., Disp: , Rfl:  .  ONETOUCH DELICA LANCETS 12W MISC, TEST SUGAR ONCE A WEEK, Disp: , Rfl: 4 .  ONETOUCH VERIO test strip, CHECK SUGAR ONCE A WEEK, Disp: , Rfl: 12 .  potassium chloride SA (K-DUR,KLOR-CON) 20 MEQ tablet, Take 1 tablet (20 mEq total) by mouth daily., Disp: 30 tablet, Rfl: 0 .  traMADol (ULTRAM) 50 MG tablet, Take 50 mg by mouth every 4-6 hours PRN severe pain., Disp: 30 tablet, Rfl: 0 .  warfarin (COUMADIN) 5 MG tablet, Take 1 tablet by mouth daily or as directed by coumadin clinic, Disp: 30 tablet, Rfl: 5  Past Medical History: Past Medical History:  Diagnosis Date  . Atrial flutter (HCC)    Ablated 1998 Dr. Caryl Comes  . Bleeding gastric ulcer    back in 2000  . Breast cancer ([place])   . COPD (chronic obstructive pulmonary disease) (HCC)    from radiation  . GERD (gastroesophageal reflux disease)   . Hodgkin's lymphoma ([place])    Treated with radiation and chemo  . Mitral regurgitation    pvc  . Osteoporosis    lumbar verterbral fracture, left ankle fracture, dexa 2015  . Pancreatitis   . Pneumonia   . Prediabetes   . 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  Ulcer     Tobacco Use: Social History   Tobacco Use  Smoking Status Never Smoker  Smokeless Tobacco Never Used    Labs: Recent Review Flowsheet Data    Labs for ITP Cardiac and Pulmonary Rehab Latest Ref Rng & Units 09/14/2017 09/14/2017 09/14/2017 09/15/2017 09/16/2017   Cholestrol 125 - 200 mg/dL - - - - -   LDLCALC <130 mg/dL - - - - -   HDL >=46 mg/dL - - - - -   Trlycerides <150 mg/dL - - - - -   Hemoglobin A1c 4.8 - 5.6 % - - - - -   PHART 7.350 - 7.450 7.332(L) 7.353 - - -   PCO2ART 32.0 - 48.0 mmHg 40.4 42.5 - - -   HCO3 20.0 - 28.0 mmol/L 21.4 23.6 - - -   TCO2 22 - 32 mmol/L 23 25 24  - -   ACIDBASEDEF 0.0 - 2.0 mmol/L  4.0(H) 2.0 - - -   O2SAT % 99.0 98.0 - 63.6 75.1      Capillary Blood Glucose: Lab Results  Component Value Date   GLUCAP 138 (H) 09/17/2017   GLUCAP 132 (H) 09/17/2017   GLUCAP 149 (H) 09/17/2017   GLUCAP 119 (H) 09/17/2017   GLUCAP 139 (H) 09/16/2017     Exercise Target Goals: Date: 10/31/17  Exercise Program Goal: Individual exercise prescription set with THRR, safety & activity barriers. Participant demonstrates ability to understand and report RPE using BORG scale, to self-measure pulse accurately, and to acknowledge the importance of the exercise prescription.  Exercise Prescription Goal: Starting with aerobic activity 30 plus minutes a day, 3 days per week for initial exercise prescription. Provide home exercise prescription and guidelines that participant acknowledges understanding prior to discharge.  Activity Barriers & Risk Stratification: Activity Barriers & Cardiac Risk Stratification - 10/31/17 0904      Activity Barriers & Cardiac Risk Stratification   Activity Barriers  None    Cardiac Risk Stratification  High       6 Minute Walk: 6 Minute Walk    Row Name 10/31/17 1157         6 Minute Walk   Phase  Initial     Distance  1354 feet     Walk Time  6 minutes     # of Rest Breaks  0     MPH  2.56     METS  3.2     RPE  13     VO2 Peak  11.34     Symptoms  Yes (comment)     Comments  lightheadedness; orthostatics      Resting HR  86 bpm     Resting BP  122/82     Resting Oxygen Saturation   97 %     Exercise Oxygen Saturation  during 6 min walk  100 %     Max Ex. HR  105 bpm     Max Ex. BP  92/60 148/72; 134/70     2 Minute Post BP  104/70        Oxygen Initial Assessment:   Oxygen Re-Evaluation:   Oxygen Discharge (Final Oxygen Re-Evaluation):   Initial Exercise Prescription: Initial Exercise Prescription - 10/31/17 1200      Date of Initial Exercise RX and Referring Provider   Date  10/31/17    Referring Provider  Minus Breeding MD      Treadmill   MPH  2.5    Grade  1  Minutes  10    METs  3.26      Bike   Level  0.7    Minutes  10    METs  3.2      NuStep   Level  3    SPM  80    Minutes  10    METs  2.5      Prescription Details   Frequency (times per week)  3    Duration  Progress to 30 minutes of continuous aerobic without signs/symptoms of physical distress      Intensity   THRR 40-80% of Max Heartrate  67-122    Ratings of Perceived Exertion  11-13    Perceived Dyspnea  0-4      Progression   Progression  Continue to progress workloads to maintain intensity without signs/symptoms of physical distress.      Resistance Training   Training Prescription  Yes    Weight  3lbs    Reps  10-15       Perform Capillary Blood Glucose checks as needed.  Exercise Prescription Changes:   Exercise Comments:   Exercise Goals and Review: Exercise Goals    Row Name 10/31/17 0904             Exercise Goals   Increase Physical Activity  Yes       Intervention  Provide advice, education, support and counseling about physical activity/exercise needs.;Develop an individualized exercise prescription for aerobic and resistive training based on initial evaluation findings, risk stratification, comorbidities and participant's personal goals.       Expected Outcomes  Achievement of increased cardiorespiratory fitness and enhanced flexibility, muscular endurance and strength shown through measurements of functional capacity and personal statement of participant.       Increase Strength and Stamina  Yes       Intervention  Provide advice, education, support and counseling about physical activity/exercise needs.;Develop an individualized exercise prescription for aerobic and resistive training based on initial evaluation findings, risk stratification, comorbidities and participant's personal goals.       Expected Outcomes  Achievement of increased cardiorespiratory fitness and enhanced flexibility,  muscular endurance and strength shown through measurements of functional capacity and personal statement of participant.       Able to understand and use rate of perceived exertion (RPE) scale  Yes       Intervention  Provide education and explanation on how to use RPE scale       Expected Outcomes  Short Term: Able to use RPE daily in rehab to express subjective intensity level;Long Term:  Able to use RPE to guide intensity level when exercising independently       Knowledge and understanding of Target Heart Rate Range (THRR)  Yes       Intervention  Provide education and explanation of THRR including how the numbers were predicted and where they are located for reference       Expected Outcomes  Short Term: Able to state/look up THRR;Long Term: Able to use THRR to govern intensity when exercising independently;Short Term: Able to use daily as guideline for intensity in rehab       Able to check pulse independently  Yes       Intervention  Provide education and demonstration on how to check pulse in carotid and radial arteries.;Review the importance of being able to check your own pulse for safety during independent exercise       Expected Outcomes  Short Term: Able  to explain why pulse checking is important during independent exercise;Long Term: Able to check pulse independently and accurately       Understanding of Exercise Prescription  Yes       Intervention  Provide education, explanation, and written materials on patient's individual exercise prescription       Expected Outcomes  Short Term: Able to explain program exercise prescription;Long Term: Able to explain home exercise prescription to exercise independently          Exercise Goals Re-Evaluation :    Discharge Exercise Prescription (Final Exercise Prescription Changes):   Nutrition:  Target Goals: Understanding of nutrition guidelines, daily intake of sodium 1500mg , cholesterol 200mg , calories 30% from fat and 7% or less from  saturated fats, daily to have 5 or more servings of fruits and vegetables.  Biometrics: Pre Biometrics - 10/31/17 1213      Pre Biometrics   Waist Circumference  29 inches    Hip Circumference  36.25 inches    Waist to Hip Ratio  0.8 %    Triceps Skinfold  23 mm    % Body Fat  32.2 %    Grip Strength  25 kg    Flexibility  13 in    Single Leg Stand  4.21 seconds        Nutrition Therapy Plan and Nutrition Goals:   Nutrition Discharge: Nutrition Scores:   Nutrition Goals Re-Evaluation:   Nutrition Goals Re-Evaluation:   Nutrition Goals Discharge (Final Nutrition Goals Re-Evaluation):   Psychosocial: Target Goals: Acknowledge presence or absence of significant depression and/or stress, maximize coping skills, provide positive support system. Participant is able to verbalize types and ability to use techniques and skills needed for reducing stress and depression.  Initial Review & Psychosocial Screening: Initial Psych Review & Screening - 10/31/17 0951      Initial Review   Current issues with  Current Stress Concerns    Source of Stress Concerns  Chronic Illness;Family      Family Dynamics   Good Support System?  Yes friends and neighbors     Concerns  Recent loss of significant other    Comments  lost husband 08/20/2017 from pancreatic cancer       Barriers   Psychosocial barriers to participate in program  There are no identifiable barriers or psychosocial needs.      Screening Interventions   Interventions  Encouraged to exercise;Provide feedback about the scores to participant       Quality of Life Scores: Quality of Life - 10/31/17 1034      Quality of Life Scores   Health/Function Pre  28.4 %    Socioeconomic Pre  30 %    Psych/Spiritual Pre  30 %    Family Pre  30 %    GLOBAL Pre  29.31 %       PHQ-9: Recent Review Flowsheet Data    There is no flowsheet data to display.     Interpretation of Total Score  Total Score Depression Severity:   1-4 = Minimal depression, 5-9 = Mild depression, 10-14 = Moderate depression, 15-19 = Moderately severe depression, 20-27 = Severe depression   Psychosocial Evaluation and Intervention:   Psychosocial Re-Evaluation:   Psychosocial Discharge (Final Psychosocial Re-Evaluation):   Vocational Rehabilitation: Provide vocational rehab assistance to qualifying candidates.   Vocational Rehab Evaluation & Intervention: Vocational Rehab - 10/31/17 0950      Initial Vocational Rehab Evaluation & Intervention   Assessment shows need for Vocational Rehabilitation  --  retired Environmental consultant principal.        Education: Education Goals: Education classes will be provided on a weekly basis, covering required topics. Participant will state understanding/return demonstration of topics presented.  Learning Barriers/Preferences: Learning Barriers/Preferences - 10/31/17 6761      Learning Barriers/Preferences   Learning Barriers  Sight    Learning Preferences  Written Material;Pictoral;Skilled Demonstration;Video;Verbal Instruction       Education Topics: Count Your Pulse:  -Group instruction provided by verbal instruction, demonstration, patient participation and written materials to support subject.  Instructors address importance of being able to find your pulse and how to count your pulse when at home without a heart monitor.  Patients get hands on experience counting their pulse with staff help and individually.   Heart Attack, Angina, and Risk Factor Modification:  -Group instruction provided by verbal instruction, video, and written materials to support subject.  Instructors address signs and symptoms of angina and heart attacks.    Also discuss risk factors for heart disease and how to make changes to improve heart health risk factors.   Functional Fitness:  -Group instruction provided by verbal instruction, demonstration, patient participation, and written materials to support subject.   Instructors address safety measures for doing things around the house.  Discuss how to get up and down off the floor, how to pick things up properly, how to safely get out of a chair without assistance, and balance training.   Meditation and Mindfulness:  -Group instruction provided by verbal instruction, patient participation, and written materials to support subject.  Instructor addresses importance of mindfulness and meditation practice to help reduce stress and improve awareness.  Instructor also leads participants through a meditation exercise.    Stretching for Flexibility and Mobility:  -Group instruction provided by verbal instruction, patient participation, and written materials to support subject.  Instructors lead participants through series of stretches that are designed to increase flexibility thus improving mobility.  These stretches are additional exercise for major muscle groups that are typically performed during regular warm up and cool down.   Hands Only CPR:  -Group verbal, video, and participation provides a basic overview of AHA guidelines for community CPR. Role-play of emergencies allow participants the opportunity to practice calling for help and chest compression technique with discussion of AED use.   Hypertension: -Group verbal and written instruction that provides a basic overview of hypertension including the most recent diagnostic guidelines, risk factor reduction with self-care instructions and medication management.    Nutrition I class: Heart Healthy Eating:  -Group instruction provided by PowerPoint slides, verbal discussion, and written materials to support subject matter. The instructor gives an explanation and review of the Therapeutic Lifestyle Changes diet recommendations, which includes a discussion on lipid goals, dietary fat, sodium, fiber, plant stanol/sterol esters, sugar, and the components of a well-balanced, healthy diet.   Nutrition II class:  Lifestyle Skills:  -Group instruction provided by PowerPoint slides, verbal discussion, and written materials to support subject matter. The instructor gives an explanation and review of label reading, grocery shopping for heart health, heart healthy recipe modifications, and ways to make healthier choices when eating out.   Diabetes Question & Answer:  -Group instruction provided by PowerPoint slides, verbal discussion, and written materials to support subject matter. The instructor gives an explanation and review of diabetes co-morbidities, pre- and post-prandial blood glucose goals, pre-exercise blood glucose goals, signs, symptoms, and treatment of hypoglycemia and hyperglycemia, and foot care basics.   Diabetes Blitz:  -Group instruction provided  by PowerPoint slides, verbal discussion, and written materials to support subject matter. The instructor gives an explanation and review of the physiology behind type 1 and type 2 diabetes, diabetes medications and rational behind using different medications, pre- and post-prandial blood glucose recommendations and Hemoglobin A1c goals, diabetes diet, and exercise including blood glucose guidelines for exercising safely.    Portion Distortion:  -Group instruction provided by PowerPoint slides, verbal discussion, written materials, and food models to support subject matter. The instructor gives an explanation of serving size versus portion size, changes in portions sizes over the last 20 years, and what consists of a serving from each food group.   Stress Management:  -Group instruction provided by verbal instruction, video, and written materials to support subject matter.  Instructors review role of stress in heart disease and how to cope with stress positively.     Exercising on Your Own:  -Group instruction provided by verbal instruction, power point, and written materials to support subject.  Instructors discuss benefits of exercise, components  of exercise, frequency and intensity of exercise, and end points for exercise.  Also discuss use of nitroglycerin and activating EMS.  Review options of places to exercise outside of rehab.  Review guidelines for sex with heart disease.   Cardiac Drugs I:  -Group instruction provided by verbal instruction and written materials to support subject.  Instructor reviews cardiac drug classes: antiplatelets, anticoagulants, beta blockers, and statins.  Instructor discusses reasons, side effects, and lifestyle considerations for each drug class.   Cardiac Drugs II:  -Group instruction provided by verbal instruction and written materials to support subject.  Instructor reviews cardiac drug classes: angiotensin converting enzyme inhibitors (ACE-I), angiotensin II receptor blockers (ARBs), nitrates, and calcium channel blockers.  Instructor discusses reasons, side effects, and lifestyle considerations for each drug class.   Anatomy and Physiology of the Circulatory System:  Group verbal and written instruction and models provide basic cardiac anatomy and physiology, with the coronary electrical and arterial systems. Review of: AMI, Angina, Valve disease, Heart Failure, Peripheral Artery Disease, Cardiac Arrhythmia, Pacemakers, and the ICD.   Other Education:  -Group or individual verbal, written, or video instructions that support the educational goals of the cardiac rehab program.   Knowledge Questionnaire Score: Knowledge Questionnaire Score - 10/31/17 0949      Knowledge Questionnaire Score   Pre Score  23/24       Core Components/Risk Factors/Patient Goals at Admission: Personal Goals and Risk Factors at Admission - 10/31/17 1236      Core Components/Risk Factors/Patient Goals on Admission   Stress  Yes Patient husband passed away in 2018-10-08    Intervention  Offer individual and/or small group education and counseling on adjustment to heart disease, stress management and  health-related lifestyle change. Teach and support self-help strategies.;Refer participants experiencing significant psychosocial distress to appropriate mental health specialists for further evaluation and treatment. When possible, include family members and significant others in education/counseling sessions.    Expected Outcomes  Short Term: Participant demonstrates changes in health-related behavior, relaxation and other stress management skills, ability to obtain effective social support, and compliance with psychotropic medications if prescribed.;Long Term: Emotional wellbeing is indicated by absence of clinically significant psychosocial distress or social isolation.       Core Components/Risk Factors/Patient Goals Review:    Core Components/Risk Factors/Patient Goals at Discharge (Final Review):    ITP Comments: ITP Comments    Row Name 10/31/17 0900  ITP Comments  Medical Director, Dr. Fransico Him          Comments: Nessa attended orientation from [facility] to [phone] to review rules and guidelines for program. Completed 6 minute walk test, Intitial ITP, and exercise prescription. Jordan complained of feeling lightheaded post walk test.  Symptoms resolved after water given Please see previous note in epic for documentation  Telemetry-Vpaced. Barnet Pall, RN,BSN 10/31/2017 12:49 PM

## 2017-10-31 NOTE — Progress Notes (Signed)
Patricia [name] 66 y.o. female DOB: January 04, 1951 MRN: [id number]      Nutrition Note  1. S/P MVR (mitral valve replacement) 09/13/17    Past Medical History:  Diagnosis Date  . Atrial flutter (HCC)    Ablated 1998 Dr. Caryl Comes  . Bleeding gastric ulcer    back in 2000  . Breast cancer ([place])   . COPD (chronic obstructive pulmonary disease) (HCC)    from radiation  . GERD (gastroesophageal reflux disease)   . Hodgkin's lymphoma ([place])    Treated with radiation and chemo  . Mitral regurgitation    pvc  . Osteoporosis    lumbar verterbral fracture, left ankle fracture, dexa 2015  . Pancreatitis   . Pneumonia   . Prediabetes   . 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 Ulcer    Meds reviewed. Coumadin noted  HT: Ht Readings from Last 1 Encounters:  10/31/17 5' 5.75" (1.67 m)    WT: Wt Readings from Last 3 Encounters:  10/31/17 129 lb 6.6 oz (58.7 kg)  10/16/17 125 lb (56.7 kg)  10/05/17 136 lb (61.7 kg)     BMI 21.0   Current tobacco use? No  Labs:  Lipid Panel     Component Value Date/Time   CHOL 183 07/07/2016 0847   TRIG 153 (H) 07/07/2016 0847   HDL 53 07/07/2016 0847   CHOLHDL 3.5 07/07/2016 0847   VLDL 31 (H) 07/07/2016 0847   LDLCALC 99 07/07/2016 0847    Lab Results  Component Value Date   HGBA1C 6.1 (H) 09/11/2017   CBG (last 3)  No results for input(s): GLUCAP in the last 72 hours.  Nutrition Note Spoke with pt. Nutrition plan and goals reviewed with pt. Pt is following a Heart Healthy diet. 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 Pt is pre-diabetic according to her last A1c.Pt expressed understanding of the information reviewed. Pt aware of nutrition education classes offered.  Nutrition Diagnosis ? Food-and nutrition-related knowledge deficit related to lack of exposure to information as related to diagnosis of: ? CVD ?  Pre-DM  Nutrition Intervention ? Pt's individual nutrition plan and goals reviewed with pt.  Nutrition Goal(s):  ? Pt able to name foods that affect blood glucose   Plan:  Pt to attend nutrition classes ? Nutrition I ? Nutrition II ? Portion Distortion  Will provide client-centered nutrition education as part of interdisciplinary care.   Monitor and evaluate progress toward nutrition goal with team.  Derek Mound, M.Ed, RD, LDN, CDE 10/31/2017 1:59 PM

## 2017-10-31 NOTE — Progress Notes (Signed)
Patient completed her walk test and reported feeling lightheaded. Initial blood pressure 122/82. Heart rate 97. Blood pressure at time of complaint 92/60. Telemetry rhythm V paced. Max heart rate 105. Patricia [name] rested for a few minutes and given water. Repeat blood pressure 148/72 sitting. Standing blood 134/70. Symptoms resolved after resting. Oxygen saturation 100% on room air. Patricia [name] reports that she feels lightheaded any time she walks for an extended amount of time. Patricia [name] said she took her medications this morning as prescribed. Patricia [name] PAC called and notified. Patricia [name] did not make any medication changes. Patricia asked that Patricia [name] makes sure she drinks at least 16 ounces of water before coming to exercise at cardiac rehab. Patient states understanding. Patricia [name] says that if  Patricia [name] has any further problems she will need to have an appointment to be seen in the office.Barnet Pall, RN,BSN 10/31/2017 10:32 AM

## 2017-11-01 ENCOUNTER — Ambulatory Visit (INDEPENDENT_AMBULATORY_CARE_PROVIDER_SITE_OTHER): Payer: Medicare Other | Admitting: Pharmacist

## 2017-11-01 DIAGNOSIS — Z7901 Long term (current) use of anticoagulants: Secondary | ICD-10-CM | POA: Diagnosis not present

## 2017-11-01 DIAGNOSIS — Z953 Presence of xenogenic heart valve: Secondary | ICD-10-CM | POA: Diagnosis not present

## 2017-11-01 LAB — POCT INR: INR: 1.7

## 2017-11-05 ENCOUNTER — Other Ambulatory Visit: Payer: Self-pay | Admitting: Cardiology

## 2017-11-06 ENCOUNTER — Encounter (HOSPITAL_COMMUNITY)
Admission: RE | Admit: 2017-11-06 | Discharge: 2017-11-06 | Disposition: A | Discharge status: Home / Self Care | Payer: Medicare Other | Source: Ambulatory Visit | Attending: Cardiology | Admitting: Cardiology

## 2017-11-06 ENCOUNTER — Telehealth: Payer: Self-pay | Admitting: Cardiology

## 2017-11-06 DIAGNOSIS — Z952 Presence of prosthetic heart valve: Secondary | ICD-10-CM | POA: Diagnosis not present

## 2017-11-06 LAB — GLUCOSE, CAPILLARY: Glucose-Capillary: 132 mg/dL — ABNORMAL HIGH (ref 65–99)

## 2017-11-06 NOTE — Telephone Encounter (Signed)
Rx has been sent to the pharmacy electronically. ° °

## 2017-11-06 NOTE — Progress Notes (Signed)
Daily Session Note  Patient Details  Name: Patricia [name] MRN: [id number] Date of Birth: August 14, 1951 Referring Provider:     CARDIAC REHAB PHASE II ORIENTATION from 10/31/2017 in [place]  Referring Provider  Minus Breeding MD      Encounter Date: 11/06/2017  Check In: Session Check In - 11/06/17 1031      Check-In   Location  [facility]    Staff Present  Su Hilt, MS, ACSM RCEP, Exercise Physiologist;Olinty Dickey, MS, ACSM CEP, Exercise Physiologist;Khiara Shuping, RN, BSN;Amber Fair, MS, ACSM RCEP, Exercise Physiologist    Supervising physician immediately available to respond to emergencies  [facility] immediately available    Physician(s)  Dr. Maylene Roes    Medication changes reported      No    Fall or balance concerns reported     No    Tobacco Cessation  No Change    Warm-up and Cool-down  Performed as group-led instruction    Resistance Training Performed  Yes    VAD Patient?  No      Pain Assessment   Currently in Pain?  No/denies    Multiple Pain Sites  No       Capillary Blood Glucose: Results for orders placed or performed during the hospital encounter of 11/06/17 (from the past 24 hour(s))  Glucose, capillary     Status: Abnormal   Collection Time: 11/06/17 10:12 AM  Result Value Ref Range   Glucose-Capillary 132 (H) 65 - 99 mg/dL      Social History   Tobacco Use  Smoking Status Never Smoker  Smokeless Tobacco Never Used    Goals Met:  Patient tolerated exercise well but did compain of feeling dizzy on walking track  Goals Unmet:  Not Applicable  Comments: Caterine started cardiac rehab today. Patient reported feeling fatigued 4 minutes into walking on the treadmill. Blood pressure 102/60. Patient was switched to the walking track. Blood pressure 105/65. Patient reported feeling a little lightheaded. Cecilie Kicks NP called and notified. Mickel Baas instructed Ms Silverstein to decrease her  furosemide to every other day via telephone. telemetry-V paced,   Medication list reconciled. Pt denies barriers to medicaiton compliance.  PSYCHOSOCIAL ASSESSMENT:  PHQ-0. Pt exhibits positive coping skills, hopeful outlook with supportive family. No psychosocial needs identified at this time, no psychosocial interventions necessary.    Pt enjoys teaching card making classes,outdoor activities, reading and yardwork .   Pt oriented to exercise equipment and routine.    Understanding verbalized.Barnet Pall, RN,BSN 11/06/2017 5:29 PM   Dr. Fransico Him is Medical Director for Cardiac Rehab at [facility].

## 2017-11-06 NOTE — Telephone Encounter (Signed)
Pt was dizzy today BP low at 031 systolic in cardiac rehab.  Wt is stable.  She has been decreased to 40 mg lasix daily will further decrease to 40 mg every other day and K+ only every other day.  She is to see Rosaria Ferries PA tomorrow.

## 2017-11-07 ENCOUNTER — Encounter: Payer: Self-pay | Admitting: Physician Assistant

## 2017-11-07 ENCOUNTER — Ambulatory Visit (INDEPENDENT_AMBULATORY_CARE_PROVIDER_SITE_OTHER): Payer: Medicare Other | Admitting: Pharmacist Clinician (PhC)/ Clinical Pharmacy Specialist

## 2017-11-07 ENCOUNTER — Ambulatory Visit (INDEPENDENT_AMBULATORY_CARE_PROVIDER_SITE_OTHER): Payer: Medicare Other | Admitting: Physician Assistant

## 2017-11-07 ENCOUNTER — Ambulatory Visit: Payer: Medicare Other | Admitting: Physician Assistant

## 2017-11-07 VITALS — BP 126/71 | HR 104 | Ht 66.0 in | Wt 130.0 lb

## 2017-11-07 DIAGNOSIS — R000 Tachycardia, unspecified: Secondary | ICD-10-CM

## 2017-11-07 DIAGNOSIS — Z7901 Long term (current) use of anticoagulants: Secondary | ICD-10-CM | POA: Diagnosis not present

## 2017-11-07 DIAGNOSIS — Z79899 Other long term (current) drug therapy: Secondary | ICD-10-CM

## 2017-11-07 DIAGNOSIS — I5032 Chronic diastolic (congestive) heart failure: Secondary | ICD-10-CM

## 2017-11-07 DIAGNOSIS — J90 Pleural effusion, not elsewhere classified: Secondary | ICD-10-CM | POA: Diagnosis not present

## 2017-11-07 DIAGNOSIS — Z953 Presence of xenogenic heart valve: Secondary | ICD-10-CM

## 2017-11-07 LAB — POCT INR: INR: 2.9

## 2017-11-07 MED ORDER — FUROSEMIDE 40 MG PO TABS: 40.0000 mg | ORAL_TABLET | ORAL | 3 refills | Status: DC

## 2017-11-07 MED ORDER — POTASSIUM CHLORIDE CRYS ER 20 MEQ PO TBCR: 20.0000 meq | EXTENDED_RELEASE_TABLET | ORAL | 3 refills | Status: DC

## 2017-11-07 MED ORDER — METOPROLOL TARTRATE 50 MG PO TABS: 75.0000 mg | ORAL_TABLET | Freq: Two times a day (BID) | ORAL | 3 refills | Status: DC

## 2017-11-07 NOTE — Patient Instructions (Signed)
Your physician has recommended you make the following change in your medication:  Increase metoprolol to 75 mg twice daily   Your physician recommends that you return for lab work in:  [facility] IN [place] has requested that you have an echocardiogram. Echocardiography is a painless test that uses sound waves to create images of your heart. It provides your doctor with information about the size and shape of your heart and how well your heart's chambers and valves are working. This procedure takes approximately one hour. There are no restrictions for this procedure. IN 1 MONTH  Your physician recommends that you schedule a follow-up appointment in:  [place]  ECHO IS DONE  DR [facility]

## 2017-11-07 NOTE — Progress Notes (Deleted)
Cardiology Office Note   Date:  11/07/2017   ID:  Patricia [name], DOB 10-15-1951, MRN [id number]  PCP:  Susy Frizzle, MD  Cardiologist:  Rosaria Ferries, PA-C   No chief complaint on file.   History of Present Illness: Patricia [name] is a 66 y.o. female with a history of ***  Patricia [name] presents for ***   Past Medical History:  Diagnosis Date  . Atrial flutter (HCC)    Ablated 1998 Dr. Caryl Comes  . Bleeding gastric ulcer    back in 2000  . Breast cancer ([place])   . COPD (chronic obstructive pulmonary disease) (HCC)    from radiation  . GERD (gastroesophageal reflux disease)   . Hodgkin's lymphoma ([place])    Treated with radiation and chemo  . Mitral regurgitation    pvc  . Osteoporosis    lumbar verterbral fracture, left ankle fracture, dexa 2015  . Pancreatitis   . Pneumonia   . Prediabetes   . 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 Ulcer     Past Surgical History:  Procedure Laterality Date  . BREAST SURGERY     breast cancer since 2005   left mastectomy  . CARDIAC ELECTROPHYSIOLOGY [place] AND ABLATION  1999  . CATARACT EXTRACTION, BILATERAL    . COLONOSCOPY    . IR RADIOLOGY PERIPHERAL GUIDED IV START  08/03/2017  . IR US GUIDE VASC ACCESS RIGHT  08/03/2017  . LAPAROTOMY    . MASTECTOMY  2005   Left-axillary  . MITRAL VALVE REPAIR Right 09/13/2017   Procedure: MINIMALLY INVASIVE MITRAL VALVE REPLACEMENT (MVR) with Medtronic Mosaic Porcine Heart Valve size 48mm;  Surgeon: Rexene Alberts, MD;  Location: [place];  Service: Open Heart Surgery;  Laterality: Right;  . ORIF ANKLE FRACTURE Left 03/21/2014   Procedure: LEFT ANKLE FRACTURE OPEN TREATMENT BILMALLEOLAR INCLUDES INTERNAL FIXATION ;  Surgeon: Renette Butters, MD;  Location: [place];  Service: Orthopedics;  Laterality: Left;  . PACEMAKER IMPLANT N/A  09/19/2017   Procedure: PACEMAKER IMPLANT;  Surgeon: Thompson Grayer, MD;  Location: [place] [facility];  Service: Cardiovascular;  Laterality: N/A;  . RIGHT/LEFT HEART CATH AND CORONARY ANGIOGRAPHY N/A 06/27/2017   Procedure: Right/Left Heart Cath and Coronary Angiography;  Surgeon: Larey [name], MD;  Location: [place] [facility];  Service: Cardiovascular;  Laterality: N/A;  . 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 TEE WITHOUT CARDIOVERSION  06/12/2012   Procedure: TRANSESOPHAGEAL ECHOCARDIOGRAM (TEE);  Surgeon: Larey [name], MD;  Location: [place];  Service: Cardiovascular;  Laterality: N/A;  . TEE WITHOUT CARDIOVERSION N/A 06/29/2017   Procedure: TRANSESOPHAGEAL ECHOCARDIOGRAM (TEE);  Surgeon: Jerline Pain, MD;  Location: [facility];  Service: Cardiovascular;  Laterality: N/A;  . TEE WITHOUT CARDIOVERSION N/A 09/13/2017   Procedure: TRANSESOPHAGEAL ECHOCARDIOGRAM (TEE);  Surgeon: Rexene Alberts, MD;  Location: [place];  Service: Open Heart Surgery;  Laterality: N/A;  . TONSILLECTOMY AND ADENOIDECTOMY      Current Outpatient Medications  Medication Sig Dispense Refill  . acetaminophen (TYLENOL) 325 MG tablet Take 2 tablets (650 mg total) by mouth 2 (two) times daily as needed for moderate pain or headache.    Marland Kitchen amiodarone (PACERONE) 200 MG tablet Take 1 tablet (200 mg total) by mouth daily. 30 tablet 1  . aspirin EC 81 MG tablet Take 81 mg by  mouth 3 (three) times a week.    . Calcium Carbonate-Vitamin D (CALCIUM PLUS VITAMIN D PO) Take 2 tablets by mouth daily.    . cetirizine (ZYRTEC) 10 MG tablet Take 10 mg by mouth daily as needed for allergies.    Marland Kitchen esomeprazole (NEXIUM) 40 MG capsule Take 40 mg by mouth daily before breakfast.   12  . furosemide (LASIX) 40 MG tablet Take 1 tablet (40 mg total) daily by mouth. 90 tablet 0  . furosemide (LASIX) 40 MG tablet TAKE 1 AND 1/2 TABLET BY MOUTH TWICE A DAY 90 tablet 2  . KLOR-CON M20 20 MEQ tablet TAKE 1 TABLET BY MOUTH EVERY DAY 90  tablet 2  . latanoprost (XALATAN) 0.005 % ophthalmic solution Place 1 drop into both eyes at bedtime.     . lipase/protease/amylase (CREON) 12000 UNITS CPEP capsule Take 1 capsule (12,000 Units total) by mouth 3 (three) times daily with meals. (Patient taking differently: Take 12,000 Units by mouth 3 (three) times daily before meals. ) 270 capsule 1  . metoprolol (LOPRESSOR) 50 MG tablet TAKE 1 TABLET (50 MG TOTAL) BY MOUTH 2 (TWO) TIMES DAILY. 180 tablet 3  . Multiple Vitamin (MULTIVITAMIN WITH MINERALS) TABS tablet Take 1 tablet by mouth daily. Centrum Silver.    Glory Rosebush DELICA LANCETS 21H MISC TEST SUGAR ONCE A WEEK  4  . ONETOUCH VERIO test strip CHECK SUGAR ONCE A WEEK  12  . traMADol (ULTRAM) 50 MG tablet Take 50 mg by mouth every 4-6 hours PRN severe pain. 30 tablet 0  . warfarin (COUMADIN) 5 MG tablet Take 1 tablet by mouth daily or as directed by coumadin clinic 30 tablet 5   No current facility-administered medications for this visit.     Allergies:   Azithromycin; Penicillins; Sulfa antibiotics; and Cheese    Social History:  The patient  reports that  has never smoked. she has never used smokeless tobacco. She reports that she does not drink alcohol or use drugs.   Family History:  The patient's family history includes Arthritis in her maternal grandmother; Cancer in her father; Diabetes in her father and paternal grandmother; Heart disease in her father; Hypertension in her maternal grandmother; Rheumatic fever in her father; Vision loss in her paternal grandmother.    ROS:  Please see the history of present illness. All other systems are reviewed and negative.    PHYSICAL EXAM: VS:  There were no vitals taken for this visit. , BMI There is no height or weight on file to calculate BMI. GEN: Well nourished, well developed, female in no acute distress HEENT: normal for age  Neck: no JVD, no carotid bruit, no masses Cardiac: RRR; no murmur, no rubs, or gallops Respiratory:   clear to auscultation bilaterally, normal work of breathing GI: soft, nontender, nondistended, + BS MS: no deformity or atrophy; no edema; distal pulses are 2+ in all 4 extremities  Skin: warm and dry, no rash Neuro:  Strength and sensation are intact Psych: euthymic mood, full affect   EKG:  EKG {ACTION; IS/IS [id number]} ordered today. The ekg ordered today demonstrates ***   Recent Labs: 09/11/2017: ALT 22 09/14/2017: Magnesium 2.4 09/27/2017: Hemoglobin 9.5; Platelets 590; TSH 4.530 10/27/2017: BUN 28; Creatinine, Ser 1.18; Potassium 4.5; Sodium 138    Lipid Panel    Component Value Date/Time   CHOL 183 07/07/2016 0847   TRIG 153 (H) 07/07/2016 0847   HDL 53 07/07/2016 0847   CHOLHDL 3.5 07/07/2016 0847  VLDL 31 (H) 07/07/2016 0847   LDLCALC 99 07/07/2016 0847     Wt Readings from Last 3 Encounters:  10/31/17 129 lb 6.6 oz (58.7 kg)  10/16/17 125 lb (56.7 kg)  10/05/17 136 lb (61.7 kg)     Other studies Reviewed: Additional studies/ records that were reviewed today include: ***.  ASSESSMENT AND PLAN:  1.  ***   Current medicines are reviewed at length with the patient today.  The patient {ACTIONS; HAS/DOES NOT HAVE:19233} concerns regarding medicines.  The following changes have been made:  {PLAN; NO CHANGE:13088:s}  Labs/ tests ordered today include: *** No orders of the defined types were placed in this encounter.    Disposition:   FU with ***  Signed, Rosaria Ferries, PA-C  11/07/2017 9:18 AM    [place] Phone: [phone]; Fax: [phone]  This note was written with the assistance of speech recognition software. Please excuse any transcriptional errors.

## 2017-11-07 NOTE — Progress Notes (Signed)
Cardiology Office Note    Date:  11/07/2017   ID:  Patricia [name], DOB 03-31-1951, MRN [id number]  PCP:  Susy Frizzle, MD  Cardiologist:  Dr. Percival Spanish  EP: Dr Rayann Heman   Chief Complaint  Patient presents with  . Follow-up    History of Present Illness:  Patricia [name] is a 66 y.o. female with PMH of severe MR s/p minimally invasive bioprosthetic MVR, atrial flutter s/p ablation 1998 Dr. Caryl Comes, COPD, Hodgkin's lymphoma s/p lap splenectomy (chemo and rad in 1974 w/ recurrent lymphoma 2 years later treated with chemo and whole body rad), breast CA s/p mastectomy 2005 w/ chemo and prediabetes.   Echocardiogram obtained on 12/23/2016 showed EF 55-60%, grade 2 DD, moderate to severe MS and moderate severe MR and moderate TR, PA peak pressure 78 mmHg. Patient underwent a left and right heart cath on 06/27/2017 which showed no significant coronary disease but 3+ MR and severe MS. She underwent TEE on 06/29/2017 which showed EF 55-60%, moderate AI, mild-to-moderate MS with mean gradient 5-7 mmHg, moderate MR directed eccentrically.   She eventually underwent minimally invasive mitral valve replacement (with Medtronic Mosaic porcine stented bioprosthetic tissue valve) by Dr. Roxy Manns on 09/13/2017. Postop course was complicated by persistent complete heart block, he was seen by Dr. Lovena Le and underwent permanent pacemaker on 09/19/2017. She does have some sinus tachycardia, device interrogation was negative for atrial fibrillation, this was treated with metoprolol and amiodarone. She was discharged on Lasix 40 mg twice a day for one week.   I saw the patient on 10/05/2017, her Lasix was increased to 60 mg twice a day at the time. She continued to have shortness of breath, recent chest x-ray showed a moderate size right pleural effusion. She eventually underwent a thoracentesis post removal of 800 mL of fluid. She was still mildly tachycardic on physical exam. I was unable to titrate her metoprolol due  to borderline blood pressure. She was seen by Dr. Roxy Manns on 10/16/2017, her Lasix was reduced to 60 mg daily.   More recently due to borderline blood pressure and presyncope at cardiac rehab, her Lasix and KCl have been reduced to 40 mg/20 meq ever other day.  No EKG today, her HR runs about 100 all the time. Last ECG was SR, V paced at 104.   She is breathing much better after the thoracentesis.. She still has some DOE but is improving at cardiac rehab. She is up to 6 times around their track at a time. She was told the distance from the door to the rehab facility is 1/4 mile.  She is able to walk this without stopping.  No LE edema.  She denies orthopnea or PND.  She has not had chest pain and feels that her incisions are all healing well.  The pacemaker site looks good.  She does not have a problem with a low-sodium diet.  She does not generally cook with sodium.  Most of her own vegetables and does this with very little to no salt.  She does not eat prepared or processed foods.  She is careful about what she eats.   Past Medical History:  Diagnosis Date  . Atrial flutter (HCC)    Ablated 1998 Dr. Caryl Comes  . Bleeding gastric ulcer    back in 2000  . Breast cancer ([place])   . COPD (chronic obstructive pulmonary disease) (HCC)    from radiation  . GERD (gastroesophageal reflux disease)   . Hodgkin's lymphoma ([place])  Treated with radiation and chemo  . Mitral regurgitation    pvc  . Osteoporosis    lumbar verterbral fracture, left ankle fracture, dexa 2015  . Pancreatitis   . Pneumonia   . Prediabetes   . 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 Ulcer     Past Surgical History:  Procedure Laterality Date  . BREAST SURGERY     breast cancer since 2005   left mastectomy  . CARDIAC ELECTROPHYSIOLOGY [place] AND ABLATION  1999  . CATARACT EXTRACTION, BILATERAL    .  COLONOSCOPY    . IR RADIOLOGY PERIPHERAL GUIDED IV START  08/03/2017  . IR US GUIDE VASC ACCESS RIGHT  08/03/2017  . LAPAROTOMY    . MASTECTOMY  2005   Left-axillary  . MITRAL VALVE REPAIR Right 09/13/2017   Procedure: MINIMALLY INVASIVE MITRAL VALVE REPLACEMENT (MVR) with Medtronic Mosaic Porcine Heart Valve size 33mm;  Surgeon: Rexene Alberts, MD;  Location: [facility];  Service: Open Heart Surgery;  Laterality: Right;  . ORIF ANKLE FRACTURE Left 03/21/2014   Procedure: LEFT ANKLE FRACTURE OPEN TREATMENT BILMALLEOLAR INCLUDES INTERNAL FIXATION ;  Surgeon: Renette Butters, MD;  Location: [facility];  Service: Orthopedics;  Laterality: Left;  . PACEMAKER IMPLANT N/A 09/19/2017   Procedure: PACEMAKER IMPLANT;  Surgeon: Thompson Grayer, MD;  Location: [facility];  Service: Cardiovascular;  Laterality: N/A;  . RIGHT/LEFT HEART CATH AND CORONARY ANGIOGRAPHY N/A 06/27/2017   Procedure: Right/Left Heart Cath and Coronary Angiography;  Surgeon: Larey [name], MD;  Location: [facility];  Service: Cardiovascular;  Laterality: N/A;  . 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 TEE WITHOUT CARDIOVERSION  06/12/2012   Procedure: TRANSESOPHAGEAL ECHOCARDIOGRAM (TEE);  Surgeon: Larey [name], MD;  Location: [facility];  Service: Cardiovascular;  Laterality: N/A;  . TEE WITHOUT CARDIOVERSION N/A 06/29/2017   Procedure: TRANSESOPHAGEAL ECHOCARDIOGRAM (TEE);  Surgeon: Jerline Pain, MD;  Location: [facility] END[facility];  Service: Cardiovascular;  Laterality: N/A;  . TEE WITHOUT CARDIOVERSION N/A 09/13/2017   Procedure: TRANSESOPHAGEAL ECHOCARDIOGRAM (TEE);  Surgeon: Rexene Alberts, MD;  Location: [facility];  Service: Open Heart Surgery;  Laterality: N/A;  . TONSILLECTOMY AND ADENOIDECTOMY      Current Medications: Outpatient Medications Prior to Visit  Medication Sig Dispense Refill  . acetaminophen (TYLENOL) 325 MG tablet Take 2 tablets (650 mg total) by mouth 2 (two) times daily as needed for  moderate pain or headache.    Marland Kitchen amiodarone (PACERONE) 200 MG tablet Take 1 tablet (200 mg total) by mouth daily. 30 tablet 1  . aspirin EC 81 MG tablet Take 81 mg by mouth 3 (three) times a week.    . Calcium Carbonate-Vitamin D (CALCIUM PLUS VITAMIN D PO) Take 2 tablets by mouth daily.    . cetirizine (ZYRTEC) 10 MG tablet Take 10 mg by mouth daily as needed for allergies.    Marland Kitchen esomeprazole (NEXIUM) 40 MG capsule Take 40 mg by mouth daily before breakfast.   12  . KLOR-CON M20 20 MEQ tablet TAKE 1 TABLET BY MOUTH EVERY DAY 90 tablet 2  . latanoprost (XALATAN) 0.005 % ophthalmic solution Place 1 drop into both eyes at bedtime.     . lipase/protease/amylase (CREON) 12000 UNITS CPEP capsule Take 1 capsule (12,000 Units total) by mouth 3 (three) times daily with meals. (Patient taking differently: Take 12,000 Units by mouth  3 (three) times daily before meals. ) 270 capsule 1  . metoprolol (LOPRESSOR) 50 MG tablet TAKE 1 TABLET (50 MG TOTAL) BY MOUTH 2 (TWO) TIMES DAILY. 180 tablet 3  . Multiple Vitamin (MULTIVITAMIN WITH MINERALS) TABS tablet Take 1 tablet by mouth daily. Centrum Silver.    Glory Rosebush DELICA LANCETS 29J MISC TEST SUGAR ONCE A WEEK  4  . ONETOUCH VERIO test strip CHECK SUGAR ONCE A WEEK  12  . traMADol (ULTRAM) 50 MG tablet Take 50 mg by mouth every 4-6 hours PRN severe pain. 30 tablet 0  . warfarin (COUMADIN) 5 MG tablet Take 1 tablet by mouth daily or as directed by coumadin clinic 30 tablet 5  . furosemide (LASIX) 40 MG tablet Take 1 tablet (40 mg total) daily by mouth. (Patient not taking: Reported on 11/07/2017) 90 tablet 0  . furosemide (LASIX) 40 MG tablet TAKE 1 AND 1/2 TABLET BY MOUTH TWICE A DAY (Patient not taking: Reported on 11/07/2017) 90 tablet 2   No facility-administered medications prior to visit.      Allergies:   Azithromycin; Penicillins; Sulfa antibiotics; and Cheese   Social History   Socioeconomic History  . Marital status: Widowed    Spouse name: None    . Number of children: None  . Years of education: None  . Highest education level: Master's degree (e.g., MA, MS, Chantal Worthey, MEd, MSW, MBA)  Social Needs  . Financial resource strain: None  . Food insecurity - worry: None  . Food insecurity - inability: None  . Transportation needs - medical: None  . Transportation needs - non-medical: None  Occupational History  . Occupation: Pharmacist, hospital (retired)  Tobacco Use  . Smoking status: Never Smoker  . Smokeless tobacco: Never Used  Substance and Sexual Activity  . Alcohol use: No  . Drug use: No  . Sexual activity: Yes  Other Topics Concern  . None  Social History Narrative   Husband passed away from Cancer in Aug 30, 2017.     Family History:  The patient's family history includes Arthritis in her maternal grandmother; Cancer in her father; Diabetes in her father and paternal grandmother; Heart disease in her father; Hypertension in her maternal grandmother; Rheumatic fever in her father; Vision loss in her paternal grandmother.   ROS:   Please see the history of present illness.     All other systems reviewed and are negative.   PHYSICAL EXAM:   VS:  BP 126/71   Pulse (!) 104   Ht 5\' 6"  (1.676 m)   Wt 130 lb (59 kg)   BMI 20.98 kg/m    GEN: Well nourished, well developed, in no acute distress  HEENT: normal  Neck: moderate JVD, carotid bruits, or masses Cardiac: RRR; 2/6 murmur, no rubs, or gallops,no edema  Respiratory: Breath sounds bases bilaterally, normal work of breathing GI: soft, nontender, nondistended, + BS MS: no deformity or atrophy  Skin: warm and dry, no rash; all incisions are well-healed Neuro:  Alert and Oriented x 3, Strength and sensation are intact Psych: euthymic mood, full affect  Wt Readings from Last 3 Encounters:  11/07/17 130 lb (59 kg)  10/31/17 129 lb 6.6 oz (58.7 kg)  10/16/17 125 lb (56.7 kg)      Studies/Labs Reviewed:   EKG:  EKG is not ordered today.    Recent Labs: 09/11/2017: ALT  22 09/14/2017: Magnesium 2.4 09/27/2017: Hemoglobin 9.5; Platelets 590; TSH 4.530 10/27/2017: BUN 28; Creatinine, Ser 1.18; Potassium 4.5; Sodium 138  Lipid Panel    Component Value Date/Time   CHOL 183 07/07/2016 0847   TRIG 153 (H) 07/07/2016 0847   HDL 53 07/07/2016 0847   CHOLHDL 3.5 07/07/2016 0847   VLDL 31 (H) 07/07/2016 0847   LDLCALC 99 07/07/2016 0847    Additional studies/ records that were reviewed today include:   Thoracentesis, 10/06/2017 PROCEDURE SUMMARY: Successful US guided right thoracentesis. Yielded 800 mL of clear, amber colored fluid. Pt tolerated procedure well. No immediate complications. Specimen was not sent for labs. CXR ordered.  Dg Chest 2 View  Result Date: 10/16/2017 CLINICAL DATA:  Right pleural effusion.  Cough.  Cardiac arrhythmia. EXAM: CHEST  2 VIEW COMPARISON:  October 06, 2017 FINDINGS: There has been interval clearing of left pleural effusion. There remains right pleural effusion with fluid tracking into the region of the right minor fissure. There is patchy atelectasis throughout the right mid lower lung zones, stable. There is mild scarring in each upper lobe. Left lung is otherwise clear. Heart size and pulmonary vascularity are normal. Patient is status post mitral valve replacement as well as pacemaker placement. Pacemaker leads are attached to right atrium and right ventricle. No adenopathy. There is aortic atherosclerosis. There is postoperative change in the upper abdomen. No bone lesions are evident. IMPRESSION: Areas of pleural effusion on the right remain stable. Note that there is fluid tracking into the right minor fissure. There has been interval resolution of left pleural effusion. Areas of patchy atelectasis noted on the right. Mild scarring noted in each upper lobe near the respective apex. There is aortic atherosclerosis. Heart size is normal. Pacemaker leads attached to right atrium and right ventricle. Patient is status post  mitral valve replacement. Aortic Atherosclerosis (ICD10-I70.0). Electronically Signed   By: Lowella Grip III M.D.   On: 10/16/2017 13:38    ASSESSMENT and PLAN:    1.  Chronic diastolic CHF: Her symptoms have improved after the thoracentesis.  She is currently taking Lasix 40 mg every other day and takes potassium with it.  We will renew this.  She is instructed to weigh daily and take an extra potassium/Lasix tablet as needed for weight gains 3 pounds in a day or 5 pounds in a week.  2.  Pleural effusions: She is breathing much better after the thoracentesis.  Her last chest x-ray showed stable pleural effusion on the right.   3. S/p minimally invasive MVR: Recheck an echocardiogram prior to follow-up with Dr. Percival Spanish.  4.  Tachycardia: She is compliant with pacemaker checks.  At her last one, she was V pacing 99.99% of the time.  Her atrial rate is generally greater than 100 however upon review of her last interrogation, there is no mention of atrial fibrillation or flutter.  Now that her Lasix has been cut back, her blood pressure is a little higher and I will increase the metoprolol.  She is to let us know if she does not tolerate this.   Medication Adjustments/Labs and Tests Ordered: Current medicines are reviewed at length with the patient today.  Concerns regarding medicines are outlined above.  Medication changes, Labs and Tests ordered today are listed in the Patient Instructions below. There are no Patient Instructions on file for this visit.   Jonetta Speak, PA-C  11/07/2017 2:50 PM    [facility] [address], [place], [place]  [address] Phone: [phone]; Fax: [phone]

## 2017-11-08 ENCOUNTER — Encounter (HOSPITAL_COMMUNITY)
Admission: RE | Admit: 2017-11-08 | Discharge: 2017-11-08 | Disposition: A | Discharge status: Home / Self Care | Payer: Medicare Other | Source: Ambulatory Visit | Attending: Cardiology | Admitting: Cardiology

## 2017-11-08 DIAGNOSIS — Z952 Presence of prosthetic heart valve: Secondary | ICD-10-CM

## 2017-11-10 ENCOUNTER — Other Ambulatory Visit: Payer: Self-pay

## 2017-11-10 ENCOUNTER — Encounter (HOSPITAL_COMMUNITY)
Admission: RE | Admit: 2017-11-10 | Discharge: 2017-11-10 | Disposition: A | Discharge status: Home / Self Care | Payer: Medicare Other | Source: Ambulatory Visit | Attending: Cardiology | Admitting: Cardiology

## 2017-11-10 ENCOUNTER — Ambulatory Visit (HOSPITAL_COMMUNITY): Payer: Medicare Other | Attending: Cardiology

## 2017-11-10 DIAGNOSIS — Z953 Presence of xenogenic heart valve: Secondary | ICD-10-CM | POA: Diagnosis not present

## 2017-11-10 DIAGNOSIS — Z952 Presence of prosthetic heart valve: Secondary | ICD-10-CM

## 2017-11-10 DIAGNOSIS — I272 Pulmonary hypertension, unspecified: Secondary | ICD-10-CM | POA: Insufficient documentation

## 2017-11-10 DIAGNOSIS — I420 Dilated cardiomyopathy: Secondary | ICD-10-CM | POA: Diagnosis not present

## 2017-11-10 DIAGNOSIS — I061 Rheumatic aortic insufficiency: Secondary | ICD-10-CM | POA: Insufficient documentation

## 2017-11-10 NOTE — Progress Notes (Signed)
Cardiac Individual Treatment Plan  Patient Details  Name: Patricia [name] MRN: [id number] Date of Birth: Oct 16, 1951 Referring Provider:     [facility] from 10/31/2017 in [facility]  Referring Provider  Minus Breeding MD      Initial Encounter Date:    CARDIAC REHAB PHASE II ORIENTATION from 10/31/2017 in [facility]  Date  10/31/17  Referring Provider  Minus Breeding MD      Visit Diagnosis: S/P MVR (mitral valve replacement) 09/13/17  Patient's Home Medications on Admission:  Current Outpatient Medications:  .  acetaminophen (TYLENOL) 325 MG tablet, Take 2 tablets (650 mg total) by mouth 2 (two) times daily as needed for moderate pain or headache., Disp: , Rfl:  .  amiodarone (PACERONE) 200 MG tablet, Take 1 tablet (200 mg total) by mouth daily., Disp: 30 tablet, Rfl: 1 .  aspirin EC 81 MG tablet, Take 81 mg by mouth 3 (three) times a week., Disp: , Rfl:  .  Calcium Carbonate-Vitamin D (CALCIUM PLUS VITAMIN D PO), Take 2 tablets by mouth daily., Disp: , Rfl:  .  cetirizine (ZYRTEC) 10 MG tablet, Take 10 mg by mouth daily as needed for allergies., Disp: , Rfl:  .  esomeprazole (NEXIUM) 40 MG capsule, Take 40 mg by mouth daily before breakfast. , Disp: , Rfl: 12 .  furosemide (LASIX) 40 MG tablet, Take 1 tablet (40 mg total) by mouth every other day. Ok to take an extra Lasix/furosemide tablet prn for wt gain 3 lbs/day or 5 lbs/week, Disp: 30 tablet, Rfl: 3 .  latanoprost (XALATAN) 0.005 % ophthalmic solution, Place 1 drop into both eyes at bedtime. , Disp: , Rfl:  .  lipase/protease/amylase (CREON) 12000 UNITS CPEP capsule, Take 1 capsule (12,000 Units total) by mouth 3 (three) times daily with meals. (Patient taking differently: Take 12,000 Units by mouth 3 (three) times daily before meals. ), Disp: 270 capsule, Rfl: 1 .  metoprolol tartrate (LOPRESSOR) 50 MG tablet, Take 1.5 tablets (75 mg  total) by mouth 2 (two) times daily., Disp: 270 tablet, Rfl: 3 .  Multiple Vitamin (MULTIVITAMIN WITH MINERALS) TABS tablet, Take 1 tablet by mouth daily. Centrum Silver., Disp: , Rfl:  .  ONETOUCH DELICA LANCETS 00P MISC, TEST SUGAR ONCE A WEEK, Disp: , Rfl: 4 .  ONETOUCH VERIO test strip, CHECK SUGAR ONCE A WEEK, Disp: , Rfl: 12 .  potassium chloride SA (KLOR-CON M20) 20 MEQ tablet, Take 1 tablet (20 mEq total) by mouth every other day. Take additional tablet every time you take and extra Lasix/furosemide tablet., Disp: 135 tablet, Rfl: 3 .  traMADol (ULTRAM) 50 MG tablet, Take 50 mg by mouth every 4-6 hours PRN severe pain., Disp: 30 tablet, Rfl: 0 .  warfarin (COUMADIN) 5 MG tablet, Take 1 tablet by mouth daily or as directed by coumadin clinic, Disp: 30 tablet, Rfl: 5  Past Medical History: Past Medical History:  Diagnosis Date  . Atrial flutter (HCC)    Ablated 1998 Dr. Caryl Comes  . Bleeding gastric ulcer    back in 2000  . Breast cancer ([place])   . COPD (chronic obstructive pulmonary disease) (HCC)    from radiation  . GERD (gastroesophageal reflux disease)   . Hodgkin's lymphoma ([place])    Treated with radiation and chemo  . Mitral regurgitation    pvc  . Osteoporosis    lumbar verterbral fracture, left ankle fracture, dexa 2015  . Pancreatitis   .  Pneumonia   . Prediabetes   . 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 Ulcer     Tobacco Use: Social History   Tobacco Use  Smoking Status Never Smoker  Smokeless Tobacco Never Used    Labs: Recent Review Flowsheet Data    Labs for ITP Cardiac and Pulmonary Rehab Latest Ref Rng & Units 09/14/2017 09/14/2017 09/14/2017 09/15/2017 09/16/2017   Cholestrol 125 - 200 mg/dL - - - - -   LDLCALC <130 mg/dL - - - - -   HDL >=46 mg/dL - - - - -   Trlycerides <150 mg/dL - - - - -   Hemoglobin A1c 4.8 - 5.6 % - - - - -   PHART  7.350 - 7.450 7.332(L) 7.353 - - -   PCO2ART 32.0 - 48.0 mmHg 40.4 42.5 - - -   HCO3 20.0 - 28.0 mmol/L 21.4 23.6 - - -   TCO2 22 - 32 mmol/L 23 25 24  - -   ACIDBASEDEF 0.0 - 2.0 mmol/L 4.0(H) 2.0 - - -   O2SAT % 99.0 98.0 - 63.6 75.1      Capillary Blood Glucose: Lab Results  Component Value Date   GLUCAP 132 (H) 11/06/2017   GLUCAP 138 (H) 09/17/2017   GLUCAP 132 (H) 09/17/2017   GLUCAP 149 (H) 09/17/2017   GLUCAP 119 (H) 09/17/2017     Exercise Target Goals:    Exercise Program Goal: Individual exercise prescription set with THRR, safety & activity barriers. Participant demonstrates ability to understand and report RPE using BORG scale, to self-measure pulse accurately, and to acknowledge the importance of the exercise prescription.  Exercise Prescription Goal: Starting with aerobic activity 30 plus minutes a day, 3 days per week for initial exercise prescription. Provide home exercise prescription and guidelines that participant acknowledges understanding prior to discharge.  Activity Barriers & Risk Stratification: Activity Barriers & Cardiac Risk Stratification - 10/31/17 0904      Activity Barriers & Cardiac Risk Stratification   Activity Barriers  None    Cardiac Risk Stratification  High       6 Minute Walk: 6 Minute Walk    Row Name 10/31/17 1157         6 Minute Walk   Phase  Initial     Distance  1354 feet     Walk Time  6 minutes     # of Rest Breaks  0     MPH  2.56     METS  3.2     RPE  13     VO2 Peak  11.34     Symptoms  Yes (comment)     Comments  lightheadedness; orthostatics      Resting HR  86 bpm     Resting BP  122/82     Resting Oxygen Saturation   97 %     Exercise Oxygen Saturation  during 6 min walk  100 %     Max Ex. HR  105 bpm     Max Ex. BP  92/60 148/72; 134/70     2 Minute Post BP  104/70        Oxygen Initial Assessment:   Oxygen Re-Evaluation:   Oxygen Discharge (Final Oxygen Re-Evaluation):   Initial  Exercise Prescription: Initial Exercise Prescription - 10/31/17 1200      Date of Initial Exercise RX and Referring Provider  Date  10/31/17    Referring Provider  Minus Breeding MD      Treadmill   MPH  2.5    Grade  1    Minutes  10    METs  3.26      Bike   Level  0.7    Minutes  10    METs  3.2      NuStep   Level  3    SPM  80    Minutes  10    METs  2.5      Prescription Details   Frequency (times per week)  3    Duration  Progress to 30 minutes of continuous aerobic without signs/symptoms of physical distress      Intensity   THRR 40-80% of Max Heartrate  67-122    Ratings of Perceived Exertion  11-13    Perceived Dyspnea  0-4      Progression   Progression  Continue to progress workloads to maintain intensity without signs/symptoms of physical distress.      Resistance Training   Training Prescription  Yes    Weight  3lbs    Reps  10-15       Perform Capillary Blood Glucose checks as needed.  Exercise Prescription Changes: Exercise Prescription Changes    Row Name 11/08/17 [phone]             Response to Exercise   Blood Pressure (Admit)  120/70       Blood Pressure (Exercise)  134/80       Blood Pressure (Exit)  120/70       Heart Rate (Admit)  94 bpm       Heart Rate (Exercise)  104 bpm       Heart Rate (Exit)  92 bpm       Rating of Perceived Exertion (Exercise)  13       Symptoms  none       Duration  Progress to 30 minutes of  aerobic without signs/symptoms of physical distress       Intensity  THRR unchanged         Progression   Progression  Continue to progress workloads to maintain intensity without signs/symptoms of physical distress.       Average METs  2.9         Resistance Training   Training Prescription  No Relaxation today         Interval Training   Interval Training  No         Treadmill   MPH  -       Grade  -       Minutes  -       METs  -         Bike   Level  0.7       Minutes  10       METs  3.22          NuStep   Level  3       SPM  80       Minutes  10       METs  2.6         Track   Laps  10       Minutes  10       METs  2.74          Exercise Comments: Exercise Comments    Row Name 11/08/17 1000  Exercise Comments  Reviewed METs with patient          Exercise Goals and Review: Exercise Goals    Row Name 10/31/17 0904             Exercise Goals   Increase Physical Activity  Yes       Intervention  Provide advice, education, support and counseling about physical activity/exercise needs.;Develop an individualized exercise prescription for aerobic and resistive training based on initial evaluation findings, risk stratification, comorbidities and participant's personal goals.       Expected Outcomes  Achievement of increased cardiorespiratory fitness and enhanced flexibility, muscular endurance and strength shown through measurements of functional capacity and personal statement of participant.       Increase Strength and Stamina  Yes       Intervention  Provide advice, education, support and counseling about physical activity/exercise needs.;Develop an individualized exercise prescription for aerobic and resistive training based on initial evaluation findings, risk stratification, comorbidities and participant's personal goals.       Expected Outcomes  Achievement of increased cardiorespiratory fitness and enhanced flexibility, muscular endurance and strength shown through measurements of functional capacity and personal statement of participant.       Able to understand and use rate of perceived exertion (RPE) scale  Yes       Intervention  Provide education and explanation on how to use RPE scale       Expected Outcomes  Short Term: Able to use RPE daily in rehab to express subjective intensity level;Long Term:  Able to use RPE to guide intensity level when exercising independently       Knowledge and understanding of Target Heart Rate Range (THRR)  Yes        Intervention  Provide education and explanation of THRR including how the numbers were predicted and where they are located for reference       Expected Outcomes  Short Term: Able to state/look up THRR;Long Term: Able to use THRR to govern intensity when exercising independently;Short Term: Able to use daily as guideline for intensity in rehab       Able to check pulse independently  Yes       Intervention  Provide education and demonstration on how to check pulse in carotid and radial arteries.;Review the importance of being able to check your own pulse for safety during independent exercise       Expected Outcomes  Short Term: Able to explain why pulse checking is important during independent exercise;Long Term: Able to check pulse independently and accurately       Understanding of Exercise Prescription  Yes       Intervention  Provide education, explanation, and written materials on patient's individual exercise prescription       Expected Outcomes  Short Term: Able to explain program exercise prescription;Long Term: Able to explain home exercise prescription to exercise independently          Exercise Goals Re-Evaluation : Exercise Goals Re-Evaluation    Row Name 11/08/17 1000             Exercise Goal Re-Evaluation   Exercise Goals Review  Increase Physical Activity       Comments  Off to a good start with exercise. Switched from treadmill to track due to symptomatic hypotension.       Expected Outcomes  Increase workloads as tolerated.           Discharge Exercise Prescription (Final Exercise Prescription Changes): Exercise  Prescription Changes - 11/08/17 0948      Response to Exercise   Blood Pressure (Admit)  120/70    Blood Pressure (Exercise)  134/80    Blood Pressure (Exit)  120/70    Heart Rate (Admit)  94 bpm    Heart Rate (Exercise)  104 bpm    Heart Rate (Exit)  92 bpm    Rating of Perceived Exertion (Exercise)  13    Symptoms  none    Duration  Progress to 30  minutes of  aerobic without signs/symptoms of physical distress    Intensity  THRR unchanged      Progression   Progression  Continue to progress workloads to maintain intensity without signs/symptoms of physical distress.    Average METs  2.9      Resistance Training   Training Prescription  No Relaxation today      Interval Training   Interval Training  No      Treadmill   MPH  --    Grade  --    Minutes  --    METs  --      Bike   Level  0.7    Minutes  10    METs  3.22      NuStep   Level  3    SPM  80    Minutes  10    METs  2.6      Track   Laps  10    Minutes  10    METs  2.74       Nutrition:  Target Goals: Understanding of nutrition guidelines, daily intake of sodium 1500mg , cholesterol 200mg , calories 30% from fat and 7% or less from saturated fats, daily to have 5 or more servings of fruits and vegetables.  Biometrics: Pre Biometrics - 10/31/17 1213      Pre Biometrics   Waist Circumference  29 inches    Hip Circumference  36.25 inches    Waist to Hip Ratio  0.8 %    Triceps Skinfold  23 mm    % Body Fat  32.2 %    Grip Strength  25 kg    Flexibility  13 in    Single Leg Stand  4.21 seconds        Nutrition Therapy Plan and Nutrition Goals: Nutrition Therapy & Goals - 10/31/17 1404      Nutrition Therapy   Diet  Heart Healthy      Personal Nutrition Goals   Nutrition Goal  Pt able to name foods that affect blood glucose       Intervention Plan   Intervention  Prescribe, educate and counsel regarding individualized specific dietary modifications aiming towards targeted core components such as weight, hypertension, lipid management, diabetes, heart failure and other comorbidities.    Expected Outcomes  Short Term Goal: Understand basic principles of dietary content, such as calories, fat, sodium, cholesterol and nutrients.;Long Term Goal: Adherence to prescribed nutrition plan.       Nutrition Discharge: Nutrition Scores: Nutrition  Assessments - 10/31/17 1403      MEDFICTS Scores   Pre Score  6       Nutrition Goals Re-Evaluation:   Nutrition Goals Re-Evaluation:   Nutrition Goals Discharge (Final Nutrition Goals Re-Evaluation):   Psychosocial: Target Goals: Acknowledge presence or absence of significant depression and/or stress, maximize coping skills, provide positive support system. Participant is able to verbalize types and ability to use techniques and skills needed for reducing  stress and depression.  Initial Review & Psychosocial Screening: Initial Psych Review & Screening - 10/31/17 0951      Initial Review   Current issues with  Current Stress Concerns    Source of Stress Concerns  Chronic Illness;Family      Family Dynamics   Good Support System?  Yes friends and neighbors     Concerns  Recent loss of significant other    Comments  lost husband 08/20/2017 from pancreatic cancer       Barriers   Psychosocial barriers to participate in program  There are no identifiable barriers or psychosocial needs.      Screening Interventions   Interventions  Encouraged to exercise;Provide feedback about the scores to participant       Quality of Life Scores: Quality of Life - 10/31/17 1034      Quality of Life Scores   Health/Function Pre  28.4 %    Socioeconomic Pre  30 %    Psych/Spiritual Pre  30 %    Family Pre  30 %    GLOBAL Pre  29.31 %       PHQ-9: Recent Review Flowsheet Data    Depression screen [facility] 2/9 11/06/2017   Decreased Interest 0   Down, Depressed, Hopeless 0   PHQ - 2 Score 0     Interpretation of Total Score  Total Score Depression Severity:  1-4 = Minimal depression, 5-9 = Mild depression, 10-14 = Moderate depression, 15-19 = Moderately severe depression, 20-27 = Severe depression   Psychosocial Evaluation and Intervention:   Psychosocial Re-Evaluation:   Psychosocial Discharge (Final Psychosocial Re-Evaluation):   Vocational Rehabilitation: Provide  vocational rehab assistance to qualifying candidates.   Vocational Rehab Evaluation & Intervention: Vocational Rehab - 10/31/17 0950      Initial Vocational Rehab Evaluation & Intervention   Assessment shows need for Vocational Rehabilitation  -- retired Environmental consultant principal.        Education: Education Goals: Education classes will be provided on a weekly basis, covering required topics. Participant will state understanding/return demonstration of topics presented.  Learning Barriers/Preferences: Learning Barriers/Preferences - 10/31/17 6789      Learning Barriers/Preferences   Learning Barriers  Sight    Learning Preferences  Written Material;Pictoral;Skilled Demonstration;Video;Verbal Instruction       Education Topics: Count Your Pulse:  -Group instruction provided by verbal instruction, demonstration, patient participation and written materials to support subject.  Instructors address importance of being able to find your pulse and how to count your pulse when at home without a heart monitor.  Patients get hands on experience counting their pulse with staff help and individually.   Heart Attack, Angina, and Risk Factor Modification:  -Group instruction provided by verbal instruction, video, and written materials to support subject.  Instructors address signs and symptoms of angina and heart attacks.    Also discuss risk factors for heart disease and how to make changes to improve heart health risk factors.   Functional Fitness:  -Group instruction provided by verbal instruction, demonstration, patient participation, and written materials to support subject.  Instructors address safety measures for doing things around the house.  Discuss how to get up and down off the floor, how to pick things up properly, how to safely get out of a chair without assistance, and balance training.   Meditation and Mindfulness:  -Group instruction provided by verbal instruction, patient  participation, and written materials to support subject.  Instructor addresses importance of mindfulness and meditation practice to  help reduce stress and improve awareness.  Instructor also leads participants through a meditation exercise.    Stretching for Flexibility and Mobility:  -Group instruction provided by verbal instruction, patient participation, and written materials to support subject.  Instructors lead participants through series of stretches that are designed to increase flexibility thus improving mobility.  These stretches are additional exercise for major muscle groups that are typically performed during regular warm up and cool down.   Hands Only CPR:  -Group verbal, video, and participation provides a basic overview of AHA guidelines for community CPR. Role-play of emergencies allow participants the opportunity to practice calling for help and chest compression technique with discussion of AED use.   Hypertension: -Group verbal and written instruction that provides a basic overview of hypertension including the most recent diagnostic guidelines, risk factor reduction with self-care instructions and medication management.    Nutrition I class: Heart Healthy Eating:  -Group instruction provided by PowerPoint slides, verbal discussion, and written materials to support subject matter. The instructor gives an explanation and review of the Therapeutic Lifestyle Changes diet recommendations, which includes a discussion on lipid goals, dietary fat, sodium, fiber, plant stanol/sterol esters, sugar, and the components of a well-balanced, healthy diet.   Nutrition II class: Lifestyle Skills:  -Group instruction provided by PowerPoint slides, verbal discussion, and written materials to support subject matter. The instructor gives an explanation and review of label reading, grocery shopping for heart health, heart healthy recipe modifications, and ways to make healthier choices when eating  out.   Diabetes Question & Answer:  -Group instruction provided by PowerPoint slides, verbal discussion, and written materials to support subject matter. The instructor gives an explanation and review of diabetes co-morbidities, pre- and post-prandial blood glucose goals, pre-exercise blood glucose goals, signs, symptoms, and treatment of hypoglycemia and hyperglycemia, and foot care basics.   Diabetes Blitz:  -Group instruction provided by PowerPoint slides, verbal discussion, and written materials to support subject matter. The instructor gives an explanation and review of the physiology behind type 1 and type 2 diabetes, diabetes medications and rational behind using different medications, pre- and post-prandial blood glucose recommendations and Hemoglobin A1c goals, diabetes diet, and exercise including blood glucose guidelines for exercising safely.    Portion Distortion:  -Group instruction provided by PowerPoint slides, verbal discussion, written materials, and food models to support subject matter. The instructor gives an explanation of serving size versus portion size, changes in portions sizes over the last 20 years, and what consists of a serving from each food group.   Stress Management:  -Group instruction provided by verbal instruction, video, and written materials to support subject matter.  Instructors review role of stress in heart disease and how to cope with stress positively.     Exercising on Your Own:  -Group instruction provided by verbal instruction, power point, and written materials to support subject.  Instructors discuss benefits of exercise, components of exercise, frequency and intensity of exercise, and end points for exercise.  Also discuss use of nitroglycerin and activating EMS.  Review options of places to exercise outside of rehab.  Review guidelines for sex with heart disease.   Cardiac Drugs I:  -Group instruction provided by verbal instruction and  written materials to support subject.  Instructor reviews cardiac drug classes: antiplatelets, anticoagulants, beta blockers, and statins.  Instructor discusses reasons, side effects, and lifestyle considerations for each drug class.   Cardiac Drugs II:  -Group instruction provided by verbal instruction and written materials to support  subject.  Instructor reviews cardiac drug classes: angiotensin converting enzyme inhibitors (ACE-I), angiotensin II receptor blockers (ARBs), nitrates, and calcium channel blockers.  Instructor discusses reasons, side effects, and lifestyle considerations for each drug class.   Anatomy and Physiology of the Circulatory System:  Group verbal and written instruction and models provide basic cardiac anatomy and physiology, with the coronary electrical and arterial systems. Review of: AMI, Angina, Valve disease, Heart Failure, Peripheral Artery Disease, Cardiac Arrhythmia, Pacemakers, and the ICD.   Other Education:  -Group or individual verbal, written, or video instructions that support the educational goals of the cardiac rehab program.   Knowledge Questionnaire Score: Knowledge Questionnaire Score - 10/31/17 0949      Knowledge Questionnaire Score   Pre Score  23/24       Core Components/Risk Factors/Patient Goals at Admission: Personal Goals and Risk Factors at Admission - 10/31/17 1236      Core Components/Risk Factors/Patient Goals on Admission   Stress  Yes Patient husband passed away in 10/02/2018    Intervention  Offer individual and/or small group education and counseling on adjustment to heart disease, stress management and health-related lifestyle change. Teach and support self-help strategies.;Refer participants experiencing significant psychosocial distress to appropriate mental health specialists for further evaluation and treatment. When possible, include family members and significant others in education/counseling sessions.    Expected  Outcomes  Short Term: Participant demonstrates changes in health-related behavior, relaxation and other stress management skills, ability to obtain effective social support, and compliance with psychotropic medications if prescribed.;Long Term: Emotional wellbeing is indicated by absence of clinically significant psychosocial distress or social isolation.       Core Components/Risk Factors/Patient Goals Review:    Core Components/Risk Factors/Patient Goals at Discharge (Final Review):    ITP Comments: ITP Comments    Row Name 10/31/17 0900 11/10/17 1644         ITP Comments  Medical Director, Dr. Fransico Him  30 day ITP review. Patient with good participation and attendance at cardiac rehab.         Comments: [place] started exercise this week and is off to a good start.Barnet Pall, RN,BSN 11/10/2017 4:46 PM

## 2017-11-13 ENCOUNTER — Encounter (HOSPITAL_COMMUNITY)
Admission: RE | Admit: 2017-11-13 | Discharge: 2017-11-13 | Disposition: A | Discharge status: Home / Self Care | Payer: Medicare Other | Source: Ambulatory Visit | Attending: Cardiology | Admitting: Cardiology

## 2017-11-13 DIAGNOSIS — Z952 Presence of prosthetic heart valve: Secondary | ICD-10-CM | POA: Diagnosis present

## 2017-11-15 ENCOUNTER — Ambulatory Visit (INDEPENDENT_AMBULATORY_CARE_PROVIDER_SITE_OTHER): Payer: Medicare Other | Admitting: Pharmacist

## 2017-11-15 ENCOUNTER — Encounter (HOSPITAL_COMMUNITY)
Admission: RE | Admit: 2017-11-15 | Discharge: 2017-11-15 | Disposition: A | Discharge status: Home / Self Care | Payer: Medicare Other | Source: Ambulatory Visit | Attending: Cardiology | Admitting: Cardiology

## 2017-11-15 DIAGNOSIS — Z7901 Long term (current) use of anticoagulants: Secondary | ICD-10-CM

## 2017-11-15 DIAGNOSIS — Z952 Presence of prosthetic heart valve: Secondary | ICD-10-CM | POA: Diagnosis not present

## 2017-11-15 DIAGNOSIS — Z953 Presence of xenogenic heart valve: Secondary | ICD-10-CM

## 2017-11-15 LAB — POCT INR: INR: 2.5

## 2017-11-15 NOTE — Progress Notes (Signed)
Reviewed home exercise guidelines with patient including endpoints, temperature precautions, target heart rate and rate of perceived exertion. Pt is walking 30 minutes, 3 days/week as her mode of home exercise. Pt voices understanding of instructions given. Sol Passer, MS, ACSM CEP

## 2017-11-17 ENCOUNTER — Encounter (HOSPITAL_COMMUNITY)
Admission: RE | Admit: 2017-11-17 | Discharge: 2017-11-17 | Disposition: A | Discharge status: Home / Self Care | Payer: Medicare Other | Source: Ambulatory Visit | Attending: Cardiology | Admitting: Cardiology

## 2017-11-17 DIAGNOSIS — Z952 Presence of prosthetic heart valve: Secondary | ICD-10-CM | POA: Diagnosis not present

## 2017-11-20 ENCOUNTER — Encounter (HOSPITAL_COMMUNITY): Payer: Medicare Other

## 2017-11-22 ENCOUNTER — Telehealth (HOSPITAL_COMMUNITY): Payer: Self-pay | Admitting: *Deleted

## 2017-11-22 ENCOUNTER — Encounter (HOSPITAL_COMMUNITY): Payer: Medicare Other

## 2017-11-24 ENCOUNTER — Encounter (HOSPITAL_COMMUNITY)
Admission: RE | Admit: 2017-11-24 | Discharge: 2017-11-24 | Disposition: A | Discharge status: Home / Self Care | Payer: Medicare Other | Source: Ambulatory Visit | Attending: Cardiology | Admitting: Cardiology

## 2017-11-24 DIAGNOSIS — Z952 Presence of prosthetic heart valve: Secondary | ICD-10-CM | POA: Diagnosis not present

## 2017-11-27 ENCOUNTER — Encounter (HOSPITAL_COMMUNITY)
Admission: RE | Admit: 2017-11-27 | Discharge: 2017-11-27 | Disposition: A | Discharge status: Home / Self Care | Payer: Medicare Other | Source: Ambulatory Visit | Attending: Cardiology | Admitting: Cardiology

## 2017-11-27 DIAGNOSIS — Z952 Presence of prosthetic heart valve: Secondary | ICD-10-CM | POA: Diagnosis not present

## 2017-11-29 ENCOUNTER — Encounter (HOSPITAL_COMMUNITY)
Admission: RE | Admit: 2017-11-29 | Discharge: 2017-11-29 | Disposition: A | Discharge status: Home / Self Care | Payer: Medicare Other | Source: Ambulatory Visit | Attending: Cardiology | Admitting: Cardiology

## 2017-11-29 ENCOUNTER — Ambulatory Visit (INDEPENDENT_AMBULATORY_CARE_PROVIDER_SITE_OTHER): Payer: Medicare Other | Admitting: Pharmacist

## 2017-11-29 ENCOUNTER — Other Ambulatory Visit: Payer: Self-pay | Admitting: Physician Assistant

## 2017-11-29 DIAGNOSIS — Z952 Presence of prosthetic heart valve: Secondary | ICD-10-CM

## 2017-11-29 DIAGNOSIS — Z7901 Long term (current) use of anticoagulants: Secondary | ICD-10-CM | POA: Diagnosis not present

## 2017-11-29 DIAGNOSIS — Z953 Presence of xenogenic heart valve: Secondary | ICD-10-CM

## 2017-11-29 LAB — POCT INR: INR: 4.1

## 2017-12-01 ENCOUNTER — Encounter (HOSPITAL_COMMUNITY)
Admission: RE | Admit: 2017-12-01 | Discharge: 2017-12-01 | Disposition: A | Discharge status: Home / Self Care | Payer: Medicare Other | Source: Ambulatory Visit | Attending: Cardiology | Admitting: Cardiology

## 2017-12-01 DIAGNOSIS — Z952 Presence of prosthetic heart valve: Secondary | ICD-10-CM | POA: Diagnosis not present

## 2017-12-06 ENCOUNTER — Encounter (HOSPITAL_COMMUNITY)
Admission: RE | Admit: 2017-12-06 | Discharge: 2017-12-06 | Disposition: A | Discharge status: Home / Self Care | Payer: Medicare Other | Source: Ambulatory Visit | Attending: Cardiology | Admitting: Cardiology

## 2017-12-06 DIAGNOSIS — C8190 Hodgkin lymphoma, unspecified, unspecified site: Secondary | ICD-10-CM | POA: Insufficient documentation

## 2017-12-06 DIAGNOSIS — I4892 Unspecified atrial flutter: Secondary | ICD-10-CM | POA: Insufficient documentation

## 2017-12-06 DIAGNOSIS — J189 Pneumonia, unspecified organism: Secondary | ICD-10-CM | POA: Insufficient documentation

## 2017-12-06 DIAGNOSIS — J449 Chronic obstructive pulmonary disease, unspecified: Secondary | ICD-10-CM | POA: Insufficient documentation

## 2017-12-06 DIAGNOSIS — Z952 Presence of prosthetic heart valve: Secondary | ICD-10-CM | POA: Diagnosis not present

## 2017-12-06 DIAGNOSIS — K219 Gastro-esophageal reflux disease without esophagitis: Secondary | ICD-10-CM | POA: Insufficient documentation

## 2017-12-06 DIAGNOSIS — C50919 Malignant neoplasm of unspecified site of unspecified female breast: Secondary | ICD-10-CM | POA: Insufficient documentation

## 2017-12-06 DIAGNOSIS — K254 Chronic or unspecified gastric ulcer with hemorrhage: Secondary | ICD-10-CM | POA: Insufficient documentation

## 2017-12-07 NOTE — Progress Notes (Signed)
Cardiac Individual Treatment Plan  Patient Details  Name: Patricia [name] MRN: [id number] Date of Birth: Mar 28, 1951 Referring Provider:     [facility] from 10/31/2017 in [place]  Referring Provider  Minus Breeding MD      Initial Encounter Date:    CARDIAC REHAB PHASE II ORIENTATION from 10/31/2017 in [place]  Date  10/31/17  Referring Provider  Minus Breeding MD      Visit Diagnosis: S/P MVR (mitral valve replacement) 09/13/17  Patient's Home Medications on Admission:  Current Outpatient Medications:  .  acetaminophen (TYLENOL) 325 MG tablet, Take 2 tablets (650 mg total) by mouth 2 (two) times daily as needed for moderate pain or headache., Disp: , Rfl:  .  amiodarone (PACERONE) 200 MG tablet, Take 1 tablet (200 mg total) by mouth daily., Disp: 30 tablet, Rfl: 1 .  aspirin EC 81 MG tablet, Take 81 mg by mouth 3 (three) times a week., Disp: , Rfl:  .  Calcium Carbonate-Vitamin D (CALCIUM PLUS VITAMIN D PO), Take 2 tablets by mouth daily., Disp: , Rfl:  .  cetirizine (ZYRTEC) 10 MG tablet, Take 10 mg by mouth daily as needed for allergies., Disp: , Rfl:  .  esomeprazole (NEXIUM) 40 MG capsule, Take 40 mg by mouth daily before breakfast. , Disp: , Rfl: 12 .  furosemide (LASIX) 40 MG tablet, Take 1 tablet (40 mg total) by mouth every other day. Ok to take an extra Lasix/furosemide tablet prn for wt gain 3 lbs/day or 5 lbs/week, Disp: 30 tablet, Rfl: 3 .  latanoprost (XALATAN) 0.005 % ophthalmic solution, Place 1 drop into both eyes at bedtime. , Disp: , Rfl:  .  lipase/protease/amylase (CREON) 12000 UNITS CPEP capsule, Take 1 capsule (12,000 Units total) by mouth 3 (three) times daily with meals. (Patient taking differently: Take 12,000 Units by mouth 3 (three) times daily before meals. ), Disp: 270 capsule, Rfl: 1 .  metoprolol tartrate (LOPRESSOR) 50 MG tablet, Take 1.5 tablets (75 mg  total) by mouth 2 (two) times daily., Disp: 270 tablet, Rfl: 3 .  Multiple Vitamin (MULTIVITAMIN WITH MINERALS) TABS tablet, Take 1 tablet by mouth daily. Centrum Silver., Disp: , Rfl:  .  ONETOUCH DELICA LANCETS 24M MISC, TEST SUGAR ONCE A WEEK, Disp: , Rfl: 4 .  ONETOUCH VERIO test strip, CHECK SUGAR ONCE A WEEK, Disp: , Rfl: 12 .  potassium chloride SA (KLOR-CON M20) 20 MEQ tablet, Take 1 tablet (20 mEq total) by mouth every other day. Take additional tablet every time you take and extra Lasix/furosemide tablet., Disp: 135 tablet, Rfl: 3 .  traMADol (ULTRAM) 50 MG tablet, Take 50 mg by mouth every 4-6 hours PRN severe pain., Disp: 30 tablet, Rfl: 0 .  warfarin (COUMADIN) 5 MG tablet, Take 1 tablet by mouth daily or as directed by coumadin clinic, Disp: 30 tablet, Rfl: 5  Past Medical History: Past Medical History:  Diagnosis Date  . Atrial flutter (HCC)    Ablated 1998 Dr. Caryl Comes  . Bleeding gastric ulcer    back in 2000  . Breast cancer ([place])   . COPD (chronic obstructive pulmonary disease) (HCC)    from radiation  . GERD (gastroesophageal reflux disease)   . Hodgkin's lymphoma ([place])    Treated with radiation and chemo  . Mitral regurgitation    pvc  . Osteoporosis    lumbar verterbral fracture, left ankle fracture, dexa 2015  . Pancreatitis   .  Pneumonia   . Prediabetes   . 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 Ulcer     Tobacco Use: Social History   Tobacco Use  Smoking Status Never Smoker  Smokeless Tobacco Never Used    Labs: Recent Review Flowsheet Data    Labs for ITP Cardiac and Pulmonary Rehab Latest Ref Rng & Units 09/14/2017 09/14/2017 09/14/2017 09/15/2017 09/16/2017   Cholestrol 125 - 200 mg/dL - - - - -   LDLCALC <130 mg/dL - - - - -   HDL >=46 mg/dL - - - - -   Trlycerides <150 mg/dL - - - - -   Hemoglobin A1c 4.8 - 5.6 % - - - - -   PHART  7.350 - 7.450 7.332(L) 7.353 - - -   PCO2ART 32.0 - 48.0 mmHg 40.4 42.5 - - -   HCO3 20.0 - 28.0 mmol/L 21.4 23.6 - - -   TCO2 22 - 32 mmol/L 23 25 24  - -   ACIDBASEDEF 0.0 - 2.0 mmol/L 4.0(H) 2.0 - - -   O2SAT % 99.0 98.0 - 63.6 75.1      Capillary Blood Glucose: Lab Results  Component Value Date   GLUCAP 132 (H) 11/06/2017   GLUCAP 138 (H) 09/17/2017   GLUCAP 132 (H) 09/17/2017   GLUCAP 149 (H) 09/17/2017   GLUCAP 119 (H) 09/17/2017     Exercise Target Goals:    Exercise Program Goal: Individual exercise prescription set with THRR, safety & activity barriers. Participant demonstrates ability to understand and report RPE using BORG scale, to self-measure pulse accurately, and to acknowledge the importance of the exercise prescription.  Exercise Prescription Goal: Starting with aerobic activity 30 plus minutes a day, 3 days per week for initial exercise prescription. Provide home exercise prescription and guidelines that participant acknowledges understanding prior to discharge.  Activity Barriers & Risk Stratification: Activity Barriers & Cardiac Risk Stratification - 10/31/17 0904      Activity Barriers & Cardiac Risk Stratification   Activity Barriers  None    Cardiac Risk Stratification  High       6 Minute Walk: 6 Minute Walk    Row Name 10/31/17 1157         6 Minute Walk   Phase  Initial     Distance  1354 feet     Walk Time  6 minutes     # of Rest Breaks  0     MPH  2.56     METS  3.2     RPE  13     VO2 Peak  11.34     Symptoms  Yes (comment)     Comments  lightheadedness; orthostatics      Resting HR  86 bpm     Resting BP  122/82     Resting Oxygen Saturation   97 %     Exercise Oxygen Saturation  during 6 min walk  100 %     Max Ex. HR  105 bpm     Max Ex. BP  92/60 148/72; 134/70     2 Minute Post BP  104/70        Oxygen Initial Assessment:   Oxygen Re-Evaluation:   Oxygen Discharge (Final Oxygen Re-Evaluation):   Initial  Exercise Prescription: Initial Exercise Prescription - 10/31/17 1200      Date of Initial Exercise RX and Referring Provider  Date  10/31/17    Referring Provider  Minus Breeding MD      Treadmill   MPH  2.5    Grade  1    Minutes  10    METs  3.26      Bike   Level  0.7    Minutes  10    METs  3.2      NuStep   Level  3    SPM  80    Minutes  10    METs  2.5      Prescription Details   Frequency (times per week)  3    Duration  Progress to 30 minutes of continuous aerobic without signs/symptoms of physical distress      Intensity   THRR 40-80% of Max Heartrate  67-122    Ratings of Perceived Exertion  11-13    Perceived Dyspnea  0-4      Progression   Progression  Continue to progress workloads to maintain intensity without signs/symptoms of physical distress.      Resistance Training   Training Prescription  Yes    Weight  3lbs    Reps  10-15       Perform Capillary Blood Glucose checks as needed.  Exercise Prescription Changes:  Exercise Prescription Changes    Row Name 11/08/17 0948 11/24/17 1100 12/06/17 1100         Response to Exercise   Blood Pressure (Admit)  120/70  102/72  112/76     Blood Pressure (Exercise)  134/80  108/62  104/70     Blood Pressure (Exit)  120/70  122/68  120/70     Heart Rate (Admit)  94 bpm  89 bpm  89 bpm     Heart Rate (Exercise)  104 bpm  97 bpm  102 bpm     Heart Rate (Exit)  92 bpm  89 bpm  89 bpm     Rating of Perceived Exertion (Exercise)  13  12  12      Symptoms  none  none  none     Duration  Progress to 30 minutes of  aerobic without signs/symptoms of physical distress  Continue with 30 min of aerobic exercise without signs/symptoms of physical distress.  Continue with 30 min of aerobic exercise without signs/symptoms of physical distress.     Intensity  THRR unchanged  -  -       Progression   Progression  Continue to progress workloads to maintain intensity without signs/symptoms of physical distress.  -   -     Average METs  2.9  2.5  2.8       Resistance Training   Training Prescription  No Relaxation today  Yes  Yes     Weight  -  3lbs  3lbs     Reps  -  10-15  10-15     Time  -  10 Minutes  10 Minutes       Interval Training   Interval Training  No  No  No       Treadmill   MPH  -  -  -     Grade  -  -  -     Minutes  -  -  -     METs  -  -  -       Bike   Level  0.7  -  -     Minutes  10  -  -  METs  3.22  -  -       Recumbant Bike   Level  -  1  2     Minutes  -  10  10     METs  -  1.8  2.4       NuStep   Level  3  3  3      SPM  80  80  80     Minutes  10  10  10      METs  2.6  2.6  2.9       Track   Laps  10  12  12      Minutes  10  10  10      METs  2.74  3.09  3.09       Home Exercise Plan   Plans to continue exercise at  -  Home (comment)  Home (comment)     Frequency  -  Add 3 additional days to program exercise sessions.  Add 3 additional days to program exercise sessions.     Initial Home Exercises Provided  -  11/15/17  11/15/17        Exercise Comments:  Exercise Comments    Row Name 11/08/17 1000 11/15/17 1009 11/24/17 0950 12/06/17 1025     Exercise Comments  Reviewed METs with patient  Reviewed home exercise with patient.  Reviewed METs and goals with patient.  Reviewed METs with patient.       Exercise Goals and Review:  Exercise Goals    Row Name 10/31/17 0904             Exercise Goals   Increase Physical Activity  Yes       Intervention  Provide advice, education, support and counseling about physical activity/exercise needs.;Develop an individualized exercise prescription for aerobic and resistive training based on initial evaluation findings, risk stratification, comorbidities and participant's personal goals.       Expected Outcomes  Achievement of increased cardiorespiratory fitness and enhanced flexibility, muscular endurance and strength shown through measurements of functional capacity and personal statement of  participant.       Increase Strength and Stamina  Yes       Intervention  Provide advice, education, support and counseling about physical activity/exercise needs.;Develop an individualized exercise prescription for aerobic and resistive training based on initial evaluation findings, risk stratification, comorbidities and participant's personal goals.       Expected Outcomes  Achievement of increased cardiorespiratory fitness and enhanced flexibility, muscular endurance and strength shown through measurements of functional capacity and personal statement of participant.       Able to understand and use rate of perceived exertion (RPE) scale  Yes       Intervention  Provide education and explanation on how to use RPE scale       Expected Outcomes  Short Term: Able to use RPE daily in rehab to express subjective intensity level;Long Term:  Able to use RPE to guide intensity level when exercising independently       Knowledge and understanding of Target Heart Rate Range (THRR)  Yes       Intervention  Provide education and explanation of THRR including how the numbers were predicted and where they are located for reference       Expected Outcomes  Short Term: Able to state/look up THRR;Long Term: Able to use THRR to govern intensity when exercising independently;Short Term: Able to use daily as guideline for intensity in  rehab       Able to check pulse independently  Yes       Intervention  Provide education and demonstration on how to check pulse in carotid and radial arteries.;Review the importance of being able to check your own pulse for safety during independent exercise       Expected Outcomes  Short Term: Able to explain why pulse checking is important during independent exercise;Long Term: Able to check pulse independently and accurately       Understanding of Exercise Prescription  Yes       Intervention  Provide education, explanation, and written materials on patient's individual exercise  prescription       Expected Outcomes  Short Term: Able to explain program exercise prescription;Long Term: Able to explain home exercise prescription to exercise independently          Exercise Goals Re-Evaluation : Exercise Goals Re-Evaluation    Row Name 11/08/17 1000 11/15/17 1009 11/24/17 0950         Exercise Goal Re-Evaluation   Exercise Goals Review  Increase Physical Activity  Increase Physical Activity;Knowledge and understanding of Target Heart Rate Range (THRR);Able to understand and use rate of perceived exertion (RPE) scale;Understanding of Exercise Prescription  Increase Physical Activity;Increase Strength and Stamina     Comments  Off to a good start with exercise. Switched from treadmill to track due to symptomatic hypotension.  Reviewed home exercise guidelines with patient including THRR, RPE scale and endpoints for exercise. Patient is currently walking 30 minutes, 3 days/week on the days she doesn't attend cardiac rehab.  Patient is doing well with exercise. She is walking and riding her stationary bike at home, 30 mins 2-3 days/week. Patient was feeling SOB using the upright bike, so moved to recumbent bike and tolerated well.     Expected Outcomes  Increase workloads as tolerated.  Continue exercise 30 minutes, 6 days/week to achieve health and fitness goals.  Progress work loads as tolerated to achieve goals of increasing strength and stamina.         Discharge Exercise Prescription (Final Exercise Prescription Changes): Exercise Prescription Changes - 12/06/17 1100      Response to Exercise   Blood Pressure (Admit)  112/76    Blood Pressure (Exercise)  104/70    Blood Pressure (Exit)  120/70    Heart Rate (Admit)  89 bpm    Heart Rate (Exercise)  102 bpm    Heart Rate (Exit)  89 bpm    Rating of Perceived Exertion (Exercise)  12    Symptoms  none    Duration  Continue with 30 min of aerobic exercise without signs/symptoms of physical distress.      Progression    Average METs  2.8      Resistance Training   Training Prescription  Yes    Weight  3lbs    Reps  10-15    Time  10 Minutes      Interval Training   Interval Training  No      Recumbant Bike   Level  2    Minutes  10    METs  2.4      NuStep   Level  3    SPM  80    Minutes  10    METs  2.9      Track   Laps  12    Minutes  10    METs  3.09      Home Exercise  Plan   Plans to continue exercise at  Home (comment)    Frequency  Add 3 additional days to program exercise sessions.    Initial Home Exercises Provided  11/15/17       Nutrition:  Target Goals: Understanding of nutrition guidelines, daily intake of sodium 1500mg , cholesterol 200mg , calories 30% from fat and 7% or less from saturated fats, daily to have 5 or more servings of fruits and vegetables.  Biometrics: Pre Biometrics - 10/31/17 1213      Pre Biometrics   Waist Circumference  29 inches    Hip Circumference  36.25 inches    Waist to Hip Ratio  0.8 %    Triceps Skinfold  23 mm    % Body Fat  32.2 %    Grip Strength  25 kg    Flexibility  13 in    Single Leg Stand  4.21 seconds        Nutrition Therapy Plan and Nutrition Goals: Nutrition Therapy & Goals - 10/31/17 1404      Nutrition Therapy   Diet  Heart Healthy      Personal Nutrition Goals   Nutrition Goal  Pt able to name foods that affect blood glucose       Intervention Plan   Intervention  Prescribe, educate and counsel regarding individualized specific dietary modifications aiming towards targeted core components such as weight, hypertension, lipid management, diabetes, heart failure and other comorbidities.    Expected Outcomes  Short Term Goal: Understand basic principles of dietary content, such as calories, fat, sodium, cholesterol and nutrients.;Long Term Goal: Adherence to prescribed nutrition plan.       Nutrition Discharge: Nutrition Scores: Nutrition Assessments - 10/31/17 1403      MEDFICTS Scores   Pre Score  6        Nutrition Goals Re-Evaluation:   Nutrition Goals Re-Evaluation:   Nutrition Goals Discharge (Final Nutrition Goals Re-Evaluation):   Psychosocial: Target Goals: Acknowledge presence or absence of significant depression and/or stress, maximize coping skills, provide positive support system. Participant is able to verbalize types and ability to use techniques and skills needed for reducing stress and depression.  Initial Review & Psychosocial Screening: Initial Psych Review & Screening - 10/31/17 0951      Initial Review   Current issues with  Current Stress Concerns    Source of Stress Concerns  Chronic Illness;Family      Family Dynamics   Good Support System?  Yes friends and neighbors     Concerns  Recent loss of significant other    Comments  lost husband 08/20/2017 from pancreatic cancer       Barriers   Psychosocial barriers to participate in program  There are no identifiable barriers or psychosocial needs.      Screening Interventions   Interventions  Encouraged to exercise;Provide feedback about the scores to participant       Quality of Life Scores: Quality of Life - 10/31/17 1034      Quality of Life Scores   Health/Function Pre  28.4 %    Socioeconomic Pre  30 %    Psych/Spiritual Pre  30 %    Family Pre  30 %    GLOBAL Pre  29.31 %       PHQ-9: Recent Review Flowsheet Data    Depression screen [facility] 2/9 11/06/2017   Decreased Interest 0   Down, Depressed, Hopeless 0   PHQ - 2 Score 0     Interpretation  of Total Score  Total Score Depression Severity:  1-4 = Minimal depression, 5-9 = Mild depression, 10-14 = Moderate depression, 15-19 = Moderately severe depression, 20-27 = Severe depression   Psychosocial Evaluation and Intervention:   Psychosocial Re-Evaluation: Psychosocial Re-Evaluation    Row Name 12/07/17 [phone]             Psychosocial Re-Evaluation   Current issues with  Current Stress Concerns       Comments  Patricia [name] lost her  husband in September of this year       Expected Outcomes  Will continue to offer support as needed       Interventions  Stress management education;Encouraged to attend Cardiac Rehabilitation for the exercise       Continue Psychosocial Services   No Follow up required         Initial Review   Source of Stress Concerns  Family          Psychosocial Discharge (Final Psychosocial Re-Evaluation): Psychosocial Re-Evaluation - 12/07/17 0951      Psychosocial Re-Evaluation   Current issues with  Current Stress Concerns    Comments  Patricia [name] lost her husband in September of this year    Expected Outcomes  Will continue to offer support as needed    Interventions  Stress management education;Encouraged to attend Cardiac Rehabilitation for the exercise    Continue Psychosocial Services   No Follow up required      Initial Review   Source of Stress Concerns  Family       Vocational Rehabilitation: Provide vocational rehab assistance to qualifying candidates.   Vocational Rehab Evaluation & Intervention: Vocational Rehab - 10/31/17 0950      Initial Vocational Rehab Evaluation & Intervention   Assessment shows need for Vocational Rehabilitation  -- retired Environmental consultant principal.        Education: Education Goals: Education classes will be provided on a weekly basis, covering required topics. Participant will state understanding/return demonstration of topics presented.  Learning Barriers/Preferences: Learning Barriers/Preferences - 10/31/17 9381      Learning Barriers/Preferences   Learning Barriers  Sight    Learning Preferences  Written Material;Pictoral;Skilled Demonstration;Video;Verbal Instruction       Education Topics: Count Your Pulse:  -Group instruction provided by verbal instruction, demonstration, patient participation and written materials to support subject.  Instructors address importance of being able to find your pulse and how to count your pulse when at home  without a heart monitor.  Patients get hands on experience counting their pulse with staff help and individually.   Heart Attack, Angina, and Risk Factor Modification:  -Group instruction provided by verbal instruction, video, and written materials to support subject.  Instructors address signs and symptoms of angina and heart attacks.    Also discuss risk factors for heart disease and how to make changes to improve heart health risk factors.   Functional Fitness:  -Group instruction provided by verbal instruction, demonstration, patient participation, and written materials to support subject.  Instructors address safety measures for doing things around the house.  Discuss how to get up and down off the floor, how to pick things up properly, how to safely get out of a chair without assistance, and balance training.   Meditation and Mindfulness:  -Group instruction provided by verbal instruction, patient participation, and written materials to support subject.  Instructor addresses importance of mindfulness and meditation practice to help reduce stress and improve awareness.  Instructor also leads participants through a meditation exercise.  Stretching for Flexibility and Mobility:  -Group instruction provided by verbal instruction, patient participation, and written materials to support subject.  Instructors lead participants through series of stretches that are designed to increase flexibility thus improving mobility.  These stretches are additional exercise for major muscle groups that are typically performed during regular warm up and cool down.   Hands Only CPR:  -Group verbal, video, and participation provides a basic overview of AHA guidelines for community CPR. Role-play of emergencies allow participants the opportunity to practice calling for help and chest compression technique with discussion of AED use.   Hypertension: -Group verbal and written instruction that provides a basic  overview of hypertension including the most recent diagnostic guidelines, risk factor reduction with self-care instructions and medication management.    Nutrition I class: Heart Healthy Eating:  -Group instruction provided by PowerPoint slides, verbal discussion, and written materials to support subject matter. The instructor gives an explanation and review of the Therapeutic Lifestyle Changes diet recommendations, which includes a discussion on lipid goals, dietary fat, sodium, fiber, plant stanol/sterol esters, sugar, and the components of a well-balanced, healthy diet.   Nutrition II class: Lifestyle Skills:  -Group instruction provided by PowerPoint slides, verbal discussion, and written materials to support subject matter. The instructor gives an explanation and review of label reading, grocery shopping for heart health, heart healthy recipe modifications, and ways to make healthier choices when eating out.   Diabetes Question & Answer:  -Group instruction provided by PowerPoint slides, verbal discussion, and written materials to support subject matter. The instructor gives an explanation and review of diabetes co-morbidities, pre- and post-prandial blood glucose goals, pre-exercise blood glucose goals, signs, symptoms, and treatment of hypoglycemia and hyperglycemia, and foot care basics.   Diabetes Blitz:  -Group instruction provided by PowerPoint slides, verbal discussion, and written materials to support subject matter. The instructor gives an explanation and review of the physiology behind type 1 and type 2 diabetes, diabetes medications and rational behind using different medications, pre- and post-prandial blood glucose recommendations and Hemoglobin A1c goals, diabetes diet, and exercise including blood glucose guidelines for exercising safely.    Portion Distortion:  -Group instruction provided by PowerPoint slides, verbal discussion, written materials, and food models to support  subject matter. The instructor gives an explanation of serving size versus portion size, changes in portions sizes over the last 20 years, and what consists of a serving from each food group.   Stress Management:  -Group instruction provided by verbal instruction, video, and written materials to support subject matter.  Instructors review role of stress in heart disease and how to cope with stress positively.     Exercising on Your Own:  -Group instruction provided by verbal instruction, power point, and written materials to support subject.  Instructors discuss benefits of exercise, components of exercise, frequency and intensity of exercise, and end points for exercise.  Also discuss use of nitroglycerin and activating EMS.  Review options of places to exercise outside of rehab.  Review guidelines for sex with heart disease.   Cardiac Drugs I:  -Group instruction provided by verbal instruction and written materials to support subject.  Instructor reviews cardiac drug classes: antiplatelets, anticoagulants, beta blockers, and statins.  Instructor discusses reasons, side effects, and lifestyle considerations for each drug class.   Cardiac Drugs II:  -Group instruction provided by verbal instruction and written materials to support subject.  Instructor reviews cardiac drug classes: angiotensin converting enzyme inhibitors (ACE-I), angiotensin II receptor blockers (ARBs), nitrates,  and calcium channel blockers.  Instructor discusses reasons, side effects, and lifestyle considerations for each drug class.   Anatomy and Physiology of the Circulatory System:  Group verbal and written instruction and models provide basic cardiac anatomy and physiology, with the coronary electrical and arterial systems. Review of: AMI, Angina, Valve disease, Heart Failure, Peripheral Artery Disease, Cardiac Arrhythmia, Pacemakers, and the ICD.   Other Education:  -Group or individual verbal, written, or video  instructions that support the educational goals of the cardiac rehab program.   Knowledge Questionnaire Score: Knowledge Questionnaire Score - 10/31/17 0949      Knowledge Questionnaire Score   Pre Score  23/24       Core Components/Risk Factors/Patient Goals at Admission: Personal Goals and Risk Factors at Admission - 10/31/17 1236      Core Components/Risk Factors/Patient Goals on Admission   Stress  Yes Patient husband passed away in September 12, 2018    Intervention  Offer individual and/or small group education and counseling on adjustment to heart disease, stress management and health-related lifestyle change. Teach and support self-help strategies.;Refer participants experiencing significant psychosocial distress to appropriate mental health specialists for further evaluation and treatment. When possible, include family members and significant others in education/counseling sessions.    Expected Outcomes  Short Term: Participant demonstrates changes in health-related behavior, relaxation and other stress management skills, ability to obtain effective social support, and compliance with psychotropic medications if prescribed.;Long Term: Emotional wellbeing is indicated by absence of clinically significant psychosocial distress or social isolation.       Core Components/Risk Factors/Patient Goals Review:  Goals and Risk Factor Review    Row Name 12/07/17 0915 12/07/17 0944           Core Components/Risk Factors/Patient Goals Review   Personal Goals Review  Stress  (Pended)   Stress      Review  -  Patricia [name] has not voiced any further stress concerns      Expected Outcomes  -  Patricia [name] will continue to attend stress managment classes at cardiac rehab         Core Components/Risk Factors/Patient Goals at Discharge (Final Review):  Goals and Risk Factor Review - 12/07/17 0944      Core Components/Risk Factors/Patient Goals Review   Personal Goals Review  Stress    Review  Patricia [name] has  not voiced any further stress concerns    Expected Outcomes  Patricia [name] will continue to attend stress managment classes at cardiac rehab       ITP Comments: ITP Comments    Row Name 10/31/17 0900 11/10/17 1644 12/07/17 0915       ITP Comments  Medical Director, Dr. Fransico Him  30 day ITP review. Patient with good participation and attendance at cardiac rehab.  30 day ITP review. Patient with good participation and attendance at cardiac rehab.        Comments: See ITP comments.Barnet Pall, RN,BSN 12/08/2017 9:01 AM

## 2017-12-08 ENCOUNTER — Encounter (HOSPITAL_COMMUNITY)
Admission: RE | Admit: 2017-12-08 | Discharge: 2017-12-08 | Disposition: A | Discharge status: Home / Self Care | Payer: Medicare Other | Source: Ambulatory Visit | Attending: Cardiology | Admitting: Cardiology

## 2017-12-08 DIAGNOSIS — Z952 Presence of prosthetic heart valve: Secondary | ICD-10-CM | POA: Diagnosis not present

## 2017-12-08 NOTE — Progress Notes (Signed)
RAYVON BRANDVOLD 66 y.o. female DOB: 1951-07-14 MRN: [id number]      Nutrition Note  1. S/P MVR (mitral valve replacement) 09/13/17    Nutrition Note Spoke with pt. Nutrition plan and survey reviewed with pt. Pt is following a Heart Healthy diet. Pt expressed understanding of the information reviewed. Pt aware of nutrition education classes offered.  Nutrition Diagnosis ? Food-and nutrition-related knowledge deficit related to lack of exposure to information as related to diagnosis of: ? CVD ? Pre-DM  Nutrition Intervention ? Pt's individual nutrition plan reviewed with pt. ? Benefits of adopting Heart Healthy diet discussed when [employer] reviewed.   ? Pt given handouts for: ? Nutrition I class ? Nutrition II class   Nutrition Goal(s):  ? Pt able to name foods that affect blood glucose   Plan:  Pt to attend nutrition classes ? Nutrition I ? Nutrition II ? Portion Distortion  Will provide client-centered nutrition education as part of interdisciplinary care.   Monitor and evaluate progress toward nutrition goal with team.  Derek Mound, M.Ed, RD, LDN, CDE 12/08/2017 10:45 AM

## 2017-12-11 ENCOUNTER — Encounter (HOSPITAL_COMMUNITY)
Admission: RE | Admit: 2017-12-11 | Discharge: 2017-12-11 | Disposition: A | Discharge status: Home / Self Care | Payer: Medicare Other | Source: Ambulatory Visit | Attending: Cardiology | Admitting: Cardiology

## 2017-12-11 DIAGNOSIS — Z952 Presence of prosthetic heart valve: Secondary | ICD-10-CM

## 2017-12-11 NOTE — Progress Notes (Signed)
Patricia [name] 66 y.o. female DOB: 1950/12/18 MRN: [id number]      Nutrition Note  1. S/P MVR (mitral valve replacement) 09/13/17    Nutrition Note Spoke with pt. Nutrition plan and survey previously reviewed with pt. Pt is following a Heart Healthy diet. Pt is taking Coumadin and is aware of the need to follow a diet with consistent vitamin K intake. Pt is aware of pre-diabetes dx. Pt checks fasting CBG's "about 2 times/week." Fasting CBG's reportedly ~ 112 mg/dL. Pt expressed understanding of the information reviewed. Pt aware of nutrition education classes offered.  Nutrition Diagnosis ? Food-and nutrition-related knowledge deficit related to lack of exposure to information as related to diagnosis of: ? CVD ? Pre-DM  Nutrition Intervention ? Pt's individual nutrition plan reviewed with pt.  Nutrition Goal(s):  ? Pt able to name foods that affect blood glucose   Plan:  Pt to attend nutrition classes ? Portion Distortion  Will provide client-centered nutrition education as part of interdisciplinary care.   Monitor and evaluate progress toward nutrition goal with team.  Derek Mound, M.Ed, RD, LDN, CDE 12/11/2017 10:49 AM

## 2017-12-13 ENCOUNTER — Encounter (HOSPITAL_COMMUNITY)
Admission: RE | Admit: 2017-12-13 | Discharge: 2017-12-13 | Disposition: A | Discharge status: Home / Self Care | Payer: Medicare Other | Source: Ambulatory Visit | Attending: Cardiology | Admitting: Cardiology

## 2017-12-13 ENCOUNTER — Ambulatory Visit (INDEPENDENT_AMBULATORY_CARE_PROVIDER_SITE_OTHER): Payer: Medicare Other | Admitting: Pharmacist Clinician (PhC)/ Clinical Pharmacy Specialist

## 2017-12-13 ENCOUNTER — Other Ambulatory Visit: Payer: Self-pay | Admitting: Physician Assistant

## 2017-12-13 DIAGNOSIS — Z953 Presence of xenogenic heart valve: Secondary | ICD-10-CM

## 2017-12-13 DIAGNOSIS — I4892 Unspecified atrial flutter: Secondary | ICD-10-CM

## 2017-12-13 DIAGNOSIS — Z952 Presence of prosthetic heart valve: Secondary | ICD-10-CM | POA: Insufficient documentation

## 2017-12-13 DIAGNOSIS — Z7901 Long term (current) use of anticoagulants: Secondary | ICD-10-CM

## 2017-12-13 LAB — POCT INR: INR: 3.5

## 2017-12-13 NOTE — Patient Instructions (Signed)
Description   Take only 1 tablet today Wednesday Jan 2, then decrease dose to 1.5 tablets daily.  Repeat INR in 2 weeks

## 2017-12-14 ENCOUNTER — Other Ambulatory Visit: Payer: Self-pay | Admitting: Cardiology

## 2017-12-15 ENCOUNTER — Encounter (HOSPITAL_COMMUNITY)
Admission: RE | Admit: 2017-12-15 | Discharge: 2017-12-15 | Disposition: A | Discharge status: Home / Self Care | Payer: Medicare Other | Source: Ambulatory Visit | Attending: Cardiology | Admitting: Cardiology

## 2017-12-15 DIAGNOSIS — Z952 Presence of prosthetic heart valve: Secondary | ICD-10-CM

## 2017-12-18 ENCOUNTER — Encounter (HOSPITAL_COMMUNITY)
Admission: RE | Admit: 2017-12-18 | Discharge: 2017-12-18 | Disposition: A | Discharge status: Home / Self Care | Payer: Medicare Other | Source: Ambulatory Visit | Attending: Cardiology | Admitting: Cardiology

## 2017-12-18 ENCOUNTER — Other Ambulatory Visit: Payer: Self-pay

## 2017-12-18 ENCOUNTER — Ambulatory Visit (INDEPENDENT_AMBULATORY_CARE_PROVIDER_SITE_OTHER): Payer: Medicare Other | Admitting: Thoracic Surgery (Cardiothoracic Vascular Surgery)

## 2017-12-18 ENCOUNTER — Encounter: Payer: Self-pay | Admitting: Thoracic Surgery (Cardiothoracic Vascular Surgery)

## 2017-12-18 ENCOUNTER — Other Ambulatory Visit: Payer: Self-pay | Admitting: Physician Assistant

## 2017-12-18 VITALS — BP 115/63 | HR 91 | Resp 18 | Ht 66.0 in | Wt 129.6 lb

## 2017-12-18 DIAGNOSIS — Z953 Presence of xenogenic heart valve: Secondary | ICD-10-CM | POA: Diagnosis not present

## 2017-12-18 DIAGNOSIS — Z952 Presence of prosthetic heart valve: Secondary | ICD-10-CM | POA: Diagnosis not present

## 2017-12-18 NOTE — Patient Instructions (Addendum)
Discuss with Dr Rayann Heman whether or not you may stop taking amiodarone.  If interrogation of your pacemaker does not reveal any signs of atrial fibrillation you may stop taking amiodarone.  Let the pharmacist at the Coumadin clinic know if you stop amiodarone.  Continue all other other previous medications without any changes at this time.  You may resume unrestricted physical activity without any particular limitations at this time.  Endocarditis is a potentially serious infection of heart valves or inside lining of the heart.  It occurs more commonly in patients with diseased heart valves (such as patient's with aortic or mitral valve disease) and in patients who have undergone heart valve repair or replacement.  Certain surgical and dental procedures may put you at risk, such as dental cleaning, other dental procedures, or any surgery involving the respiratory, urinary, gastrointestinal tract, gallbladder or prostate gland.   To minimize your chances for develooping endocarditis, maintain good oral health and seek prompt medical attention for any infections involving the mouth, teeth, gums, skin or urinary tract.    Always notify your doctor or dentist about your underlying heart valve condition before having any invasive procedures. You will need to take antibiotics before certain procedures, including all routine dental cleanings or other dental procedures.  Your cardiologist or dentist should prescribe these antibiotics for you to be taken ahead of time.

## 2017-12-18 NOTE — Progress Notes (Signed)
[address]       [name] [address]             [phone]     CARDIOTHORACIC SURGERY OFFICE NOTE  Referring Provider is Patricia Breeding, MD PCP is Patricia Frizzle, MD   HPI:  Patient is a 67 year old female with history of severe mitral regurgitation, mild aortic insufficiency, atrial flutter status post ablation, Hodgkin's lymphoma treated with chemotherapy and radiation therapy followed by additional whole-body radiation therapy secondary to recurrence who returns the office today for routine follow-up status post minimally invasive mitral valve replacement using a bioprosthetic tissue valve on September 13, 2017.  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  She has otherwise recovered uneventfully and she was last seen here in our office on October 16, 2017.  Routine follow-up echocardiogram performed November 10, 2017 by report revealed normal left ventricular systolic function with ejection fraction estimated 50-55% a normal functioning bioprosthetic tissue valve in the mitral position was functioning normally although mean transvalvular gradient was estimated 7 mmHg, mild aortic insufficiency, mild tricuspid regurgitation, and moderate-severe pulmonary hypertension.  She returns to our office for routine follow-up today.  She reports that overall she is doing quite well.  She has been participating in the outpatient cardiac rehab program and making steady progress.  She still gets short of breath with activity, but her exercise tolerance is improving and she states that she is now better than she was prior to surgery.  She can lay flat in bed without feeling  short of breath.  Her weight has been stable.  She no longer has any significant soreness in her chest.  She has not had any palpitations or other symptoms to suggest a recurrence of atrial fibrillation.  She remains anticoagulated using warfarin.  She is scheduled for follow-up with Dr. Rayann [name] to check her pacemaker next week.     Current Outpatient Medications  Medication Sig Dispense Refill  . acetaminophen (TYLENOL) 325 MG tablet Take 2 tablets (650 mg total) by mouth 2 (two) times daily as needed for moderate pain or headache.    Marland Kitchen amiodarone (PACERONE) 200 MG tablet Take 1 tablet (200 mg total) by mouth daily. 30 tablet 1  . aspirin EC 81 MG tablet Take 81 mg by mouth 3 (three) times a week.    . Calcium Carbonate-Vitamin D (CALCIUM PLUS VITAMIN D PO) Take 2 tablets by mouth daily.    . cetirizine (ZYRTEC) 10 MG tablet Take 10 mg by mouth daily as needed for allergies.    Marland Kitchen esomeprazole (NEXIUM) 40 MG capsule Take 40 mg by mouth daily before breakfast.   12  . furosemide (LASIX) 40 MG tablet Take 1 tablet (40 mg total) by mouth every other day. Ok to take an extra Lasix/furosemide tablet prn for wt gain 3 lbs/day or 5 lbs/week 30 tablet 3  . latanoprost (XALATAN) 0.005 % ophthalmic solution Place 1 drop into both eyes at bedtime.     . lipase/protease/amylase (CREON) 12000 UNITS CPEP capsule Take 1 capsule (12,000 Units total) by mouth 3 (three) times daily with meals. (Patient taking differently: Take 12,000 Units by mouth 3 (three) times daily before meals. ) 270 capsule 1  . metoprolol tartrate (  LOPRESSOR) 50 MG tablet TAKE 1 TABLET (50 MG TOTAL) BY MOUTH 2 (TWO) TIMES DAILY. 180 tablet 3  . Multiple Vitamin (MULTIVITAMIN WITH MINERALS) TABS tablet Take 1 tablet by mouth daily. Centrum Silver.    Glory Rosebush DELICA LANCETS 88B MISC TEST SUGAR ONCE A WEEK  4  . ONETOUCH VERIO test strip CHECK SUGAR ONCE A WEEK  12  . potassium chloride SA (KLOR-CON M20) 20 MEQ tablet Take 1 tablet (20 mEq  total) by mouth every other day. Take additional tablet every time you take and extra Lasix/furosemide tablet. 135 tablet 3  . warfarin (COUMADIN) 5 MG tablet Take 1 tablet by mouth daily or as directed by coumadin clinic 30 tablet 5   No current facility-administered medications for this visit.       Physical Exam:   BP 115/63 (BP Location: Right Arm, Patient Position: Sitting, Cuff Size: Normal)   Pulse 91   Resp 18   Ht 5\' 6"  (1.676 m)   Wt 129 lb 9.6 oz (58.8 kg)   SpO2 98% Comment: ON RA  BMI 20.92 kg/m   General:  Well-appearing  Chest:   Clear to auscultation with symmetrical breath sounds  CV:   Regular rate and rhythm without murmur  Incisions:  Completely healed  Abdomen:  Soft nontender  Extremities:  Warm and well perfused, no edema  Diagnostic Tests:  Transthoracic Echocardiography  Patient:    Patricia, [name] MR #:       [id number] Study Date: 11/10/2017 Gender:     F Age:        57 Height:     167.6 cm Weight:     59 kg BSA:        1.66 m^2 Pt. Status: Room:   SONOGRAPHER  Patricia [name]  ATTENDING    [name], Patricia Croon  ORDERING     [name], Patricia Croon  REFERRING    [name], Patricia Croon  PERFORMING   Patricia [name], Outpatient  cc:  ------------------------------------------------------------------- LV EF: 50% -   55%  ------------------------------------------------------------------- Indications:      Z95.3 Mitral valve replacement.  ------------------------------------------------------------------- History:   PMH:  Hodgkin&'s lymphoma. Breast cancer. Status post mastectomy. Prediabetes.  Atrial flutter.  Chronic obstructive pulmonary disease.  ------------------------------------------------------------------- Study Conclusions  - Left ventricle: The cavity size was normal. Wall thickness was   normal. Systolic function was normal. The estimated ejection   fraction was in the range of 50% to 55%. There is hypokinesis of   the septal  myocardium. - Aortic valve: There was mild regurgitation. - Mitral valve: A bioprosthesis was present. Valve area by   continuity equation (using LVOT flow): 0.69 cm^2. - Left atrium: The atrium was mildly dilated. - Pulmonary arteries: Systolic pressure was moderately to severely   increased. PA peak pressure: 63 mm Hg (S).  Impressions:  - Septal hypokinesis with overall preserved LV systolic function;   sclerotic aortic valve with mild AI; s/p MVR with mean gradient   of 7 mmHg and no MR; mild LAE; mild TR with moderate to severe   pulmonary hypertension.  ------------------------------------------------------------------- Study data:  Comparison was made to the study of 09/13/2017.  Study status:  Routine.  Procedure:  The patient reported no pain pre or post test. Transthoracic echocardiography. Image quality was adequate.  Study completion:  There were no complications. Transthoracic echocardiography.  M-mode, complete 2D, spectral Doppler, and color Doppler.  Birthdate:  Patient birthdate: [birth year].  Age:  Patient is 67 yr old.  Sex:  Gender: female. BMI: 21 kg/m^2.  Blood pressure:     126/71  Patient status: Outpatient.  Study date:  Study date: 11/10/2017. Study time: 01:06 PM.  Location:  [facility]  -------------------------------------------------------------------  ------------------------------------------------------------------- Left ventricle:  The cavity size was normal. Wall thickness was normal. Systolic function was normal. The estimated ejection fraction was in the range of 50% to 55%.  Regional wall motion abnormalities:  There is hypokinesis of the septal myocardium.  ------------------------------------------------------------------- Aortic valve:   Trileaflet; mildly thickened leaflets. Cusp separation was normal. Mobility was not restricted.  Doppler: Transvalvular velocity was within the normal range. There was no stenosis. There was  mild regurgitation.  ------------------------------------------------------------------- Aorta:  Aortic root: The aortic root was normal in size.  ------------------------------------------------------------------- Mitral valve:  A bioprosthesis was present. Mobility was not restricted.  Doppler:  Transvalvular velocity was within the normal range. There was no evidence for stenosis. There was no regurgitation.    Valve area by pressure half-time: 4.49 cm^2. Indexed valve area by pressure half-time: 2.71 cm^2/m^2. Valve area by continuity equation (using LVOT flow): 0.69 cm^2. Indexed valve area by continuity equation (using LVOT flow): 0.42 cm^2/m^2. Mean gradient (D): 7 mm Hg.  ------------------------------------------------------------------- Left atrium:  The atrium was mildly dilated.  ------------------------------------------------------------------- Right ventricle:  The cavity size was normal. Pacer wire or catheter noted in right ventricle. Systolic function was normal.   ------------------------------------------------------------------- Pulmonic valve:    Doppler:  Transvalvular velocity was within the normal range. There was no evidence for stenosis. There was mild regurgitation.  ------------------------------------------------------------------- Tricuspid valve:   Structurally normal valve.    Doppler: Transvalvular velocity was within the normal range. There was mild regurgitation.  ------------------------------------------------------------------- Pulmonary artery:   Systolic pressure was moderately to severely increased.  ------------------------------------------------------------------- Right atrium:  The atrium was normal in size. Pacer wire or catheter noted in right atrium.  ------------------------------------------------------------------- Pericardium:  There was no pericardial  effusion.  ------------------------------------------------------------------- Systemic veins: Inferior vena cava: The vessel was dilated.  ------------------------------------------------------------------- Measurements   Left ventricle                           Value          Reference  LV ID, ED, PLAX chordal          (L)     31.2  mm       43 - 52  LV ID, ES, PLAX chordal                  24.6  mm       23 - 38  LV fx shortening, PLAX chordal   (L)     21    %        >=29  LV PW thickness, ED                      11.5  mm       ----------  IVS/LV PW ratio, ED                      0.99           <=1.3  Stroke volume, 2D                        30    ml       ----------  Stroke volume/bsa, 2D  18    ml/m^2   ----------    Ventricular septum                       Value          Reference  IVS thickness, ED                        11.4  mm       ----------    LVOT                                     Value          Reference  LVOT ID, S                               16    mm       ----------  LVOT area                                2.01  cm^2     ----------  LVOT peak velocity, S                    76    cm/s     ----------  LVOT mean velocity, S                    46    cm/s     ----------  LVOT VTI, S                              15    cm       ----------    Aortic valve                             Value          Reference  Aortic regurg peak velocity              315   cm/s     ----------  Aortic regurg pressure half-time         230   ms       ----------  Aortic regurg peak gradient              40    mm Hg    ----------    Aorta                                    Value          Reference  Aortic root ID, ED                       25    mm       ----------    Left atrium                              Value          Reference  LA ID, A-P, ES  30    mm       ----------  LA ID/bsa, A-P                           1.81  cm/m^2   <=2.2     Mitral valve                             Value          Reference  Mitral mean velocity, D                  114   cm/s     ----------  Mitral pressure half-time                49    ms       ----------  Mitral mean gradient, D                  7     mm Hg    ----------  Mitral valve area, PHT, DP               4.49  cm^2     ----------  Mitral valve area/bsa, PHT, DP           2.71  cm^2/m^2 ----------  Mitral valve area, LVOT                  0.69  cm^2     ----------  continuity  Mitral valve area/bsa, LVOT              0.42  cm^2/m^2 ----------  continuity  Mitral annulus VTI, D                    43.4  cm       ----------    Pulmonary arteries                       Value          Reference  PA pressure, S, DP               (H)     63    mm Hg    <=30    Tricuspid valve                          Value          Reference  Tricuspid regurg peak velocity           346   cm/s     ----------  Tricuspid peak RV-RA gradient            48    mm Hg    ----------    Right atrium                             Value          Reference  RA ID, S-I, ES, A4C                      48.7  mm       34 - 49  RA area, ES, A4C  16.6  cm^2     8.3 - 19.5  RA volume, ES, A/L                       46.2  ml       ----------  RA volume/bsa, ES, A/L                   27.9  ml/m^2   ----------    Right ventricle                          Value          Reference  RV pressure, S, DP               (H)     63    mm Hg    <=30  RV s&', lateral, S                        5.44  cm/s     ----------    Pulmonic valve                           Value          Reference  Pulmonic regurg velocity, ED             145   cm/s     ----------  Pulmonic regurg gradient, ED             8     mm Hg    ----------  Legend: (L)  and  (H)  mark values outside specified reference range.  ------------------------------------------------------------------- Prepared and Electronically Authenticated by  Kirk Ruths 2018-11-30T14:07:10   Impression:  Patient is doing well approximately 3 months status post minimally invasive mitral valve replacement using a bioprosthetic tissue valve for severe symptomatic mitral regurgitation secondary to radiation therapy.  She currently describes symptoms of exertional shortness of breath consistent with chronic diastolic congestive heart failure, [place] Heart Association functional class II.  I have personally reviewed her follow-up echocardiogram performed November 10, 2017.  Left ventricular systolic function appears close to normal with ejection fraction estimated 50-55%.  The bioprosthetic tissue valve in the mitral position is functioning normally and there is no sign of paravalvular leak.  She still has aortic insufficiency which has been reported as being mild.  I think it is probably moderate in severity but not significantly different than it was prior to mitral valve replacement.  Mean transvalvular gradient across the mitral valve was estimated 7 mmHg which I think may be elevated due to the presence of significant aortic insufficiency.  All 3 leaflets of the mitral prosthesis appear to be moving normally.   Plan:  We have not recommended any change the patient's current medications.  I think it would be reasonable to stop amiodarone if the patient does not have any evidence for atrial fibrillation when her pacemaker is interrogated.  She probably should remain on warfarin for at least another month or 2 after amiodarone has been stopped.  The patient may resume unrestricted physical activity.  The patient has been reminded regarding the importance of dental hygiene and the lifelong need for antibiotic prophylaxis for all dental cleanings and other related invasive procedures.  She will return to our office for routine follow-up next October, approximately 1 year following her surgery.  She will  call and return sooner should specific problems or questions  arise.  I spent in excess of 15 minutes during the conduct of this office consultation and >50% of this time involved direct face-to-face encounter with the patient for counseling and/or coordination of their care.  Level 2                 10 minutes Level 3                 15 minutes Level 4                 25 minutes Level 5                 40 minutes  Alyiah Ulloa H. Roxy Manns, MD 12/18/2017 1:09 PM

## 2017-12-19 ENCOUNTER — Telehealth: Payer: Self-pay | Admitting: *Deleted

## 2017-12-19 MED ORDER — AMIODARONE HCL 200 MG PO TABS: 200.0000 mg | ORAL_TABLET | Freq: Every day | ORAL | 0 refills | Status: DC

## 2017-12-19 NOTE — Telephone Encounter (Signed)
Called placed to the patient. The patient needs a refill on amiodarone. Per the last office visit with Cardiothoracic surgery, amiodarone may be discontinued if the patient is no longer having atrial fibrillation. The patient has a follow up for a pacer check on 1/16. At that time it will be evaluated if the patient has had any episodes of atrial fibrillation.   The patient stated that she has 2 pills remaining. A refill of 10 pills has been sent electronically to the pharmacy, enough to get her through until her appointment on 1/16. The patient has verbalized her understanding of the instructions and realizes that amiodarone may be discontinued at that point. Further discussion will take place at her follow up.

## 2017-12-20 ENCOUNTER — Encounter (HOSPITAL_COMMUNITY)
Admission: RE | Admit: 2017-12-20 | Discharge: 2017-12-20 | Disposition: A | Discharge status: Home / Self Care | Payer: Medicare Other | Source: Ambulatory Visit | Attending: Cardiology | Admitting: Cardiology

## 2017-12-20 DIAGNOSIS — Z952 Presence of prosthetic heart valve: Secondary | ICD-10-CM

## 2017-12-22 ENCOUNTER — Encounter (HOSPITAL_COMMUNITY)
Admission: RE | Admit: 2017-12-22 | Discharge: 2017-12-22 | Disposition: A | Discharge status: Home / Self Care | Payer: Medicare Other | Source: Ambulatory Visit | Attending: Cardiology | Admitting: Cardiology

## 2017-12-22 DIAGNOSIS — Z952 Presence of prosthetic heart valve: Secondary | ICD-10-CM | POA: Diagnosis not present

## 2017-12-25 ENCOUNTER — Encounter (HOSPITAL_COMMUNITY)
Admission: RE | Admit: 2017-12-25 | Discharge: 2017-12-25 | Disposition: A | Discharge status: Home / Self Care | Payer: Medicare Other | Source: Ambulatory Visit | Attending: Cardiology | Admitting: Cardiology

## 2017-12-25 DIAGNOSIS — Z952 Presence of prosthetic heart valve: Secondary | ICD-10-CM

## 2017-12-26 ENCOUNTER — Ambulatory Visit (INDEPENDENT_AMBULATORY_CARE_PROVIDER_SITE_OTHER): Payer: Self-pay | Admitting: *Deleted

## 2017-12-26 DIAGNOSIS — I442 Atrioventricular block, complete: Secondary | ICD-10-CM

## 2017-12-27 ENCOUNTER — Ambulatory Visit (INDEPENDENT_AMBULATORY_CARE_PROVIDER_SITE_OTHER): Payer: Medicare Other | Admitting: Internal Medicine

## 2017-12-27 ENCOUNTER — Encounter: Payer: Self-pay | Admitting: Internal Medicine

## 2017-12-27 ENCOUNTER — Encounter (HOSPITAL_COMMUNITY)
Admission: RE | Admit: 2017-12-27 | Discharge: 2017-12-27 | Disposition: A | Discharge status: Home / Self Care | Payer: Medicare Other | Source: Ambulatory Visit | Attending: Cardiology | Admitting: Cardiology

## 2017-12-27 VITALS — BP 126/68 | HR 88 | Ht 66.0 in | Wt 128.0 lb

## 2017-12-27 DIAGNOSIS — I4892 Unspecified atrial flutter: Secondary | ICD-10-CM | POA: Diagnosis not present

## 2017-12-27 DIAGNOSIS — Z952 Presence of prosthetic heart valve: Secondary | ICD-10-CM

## 2017-12-27 DIAGNOSIS — Z953 Presence of xenogenic heart valve: Secondary | ICD-10-CM | POA: Diagnosis not present

## 2017-12-27 DIAGNOSIS — I442 Atrioventricular block, complete: Secondary | ICD-10-CM

## 2017-12-27 LAB — CUP PACEART INCLINIC DEVICE CHECK
Battery Remaining Longevity: 123 mo
Battery Voltage: 3.14 V
Brady Statistic AP VP Percent: 0 %
Brady Statistic AP VS Percent: 0 %
Brady Statistic AS VP Percent: 99.99 %
Brady Statistic AS VS Percent: 0 %
Brady Statistic RA Percent Paced: 0 %
Brady Statistic RV Percent Paced: 100 %
Date Time Interrogation Session: 20190116121645
Implantable Lead Implant Date: 20181009
Implantable Lead Implant Date: 20181009
Implantable Lead Location: 753859
Implantable Lead Location: 753860
Implantable Lead Model: 5076
Implantable Lead Model: 5076
Implantable Pulse Generator Implant Date: 20181009
Lead Channel Impedance Value: 247 Ohm
Lead Channel Impedance Value: 323 Ohm
Lead Channel Impedance Value: 323 Ohm
Lead Channel Impedance Value: 437 Ohm
Lead Channel Pacing Threshold Amplitude: 0.875 V
Lead Channel Pacing Threshold Amplitude: 0.875 V
Lead Channel Pacing Threshold Pulse Width: 0.4 ms
Lead Channel Pacing Threshold Pulse Width: 0.4 ms
Lead Channel Sensing Intrinsic Amplitude: 2.5 mV
Lead Channel Sensing Intrinsic Amplitude: 2.5 mV
Lead Channel Sensing Intrinsic Amplitude: 8.25 mV
Lead Channel Setting Pacing Amplitude: 3.5 V
Lead Channel Setting Pacing Amplitude: 3.5 V
Lead Channel Setting Pacing Pulse Width: 0.4 ms
Lead Channel Setting Sensing Sensitivity: 2 mV

## 2017-12-27 NOTE — Patient Instructions (Addendum)
Medication Instructions:  Your physician has recommended you make the following change in your medication: 1) Stop Amiodarone   Labwork: None ordered   Testing/Procedures: None ordered   Follow-Up: Your physician recommends that you schedule a follow-up appointment in: 2 months with Dr Rayann Heman   Any Other Special Instructions Will Be Listed Below (If Applicable).     If you need a refill on your cardiac medications before your next appointment, please call your pharmacy.

## 2017-12-27 NOTE — Progress Notes (Signed)
PCP: Susy Frizzle, MD Primary Cardiologist:  Dr Percival Spanish Primary EP:  Dr Rayann Heman  Patricia [name] is a 67 y.o. female who presents today for routine electrophysiology followup.  Since her recent MVR and subsequent PPM, the patient reports doing reasonably well.  She is making good recovery.  No symptoms of afib.  Today, she denies symptoms of palpitations, chest pain, shortness of breath,  lower extremity edema, dizziness, presyncope, or syncope.  The patient is otherwise without complaint today.   Past Medical History:  Diagnosis Date  . Atrial flutter (HCC)    Ablated 1998 Dr. Caryl Comes  . Bleeding gastric ulcer    back in 2000  . Breast cancer ([place])   . COPD (chronic obstructive pulmonary disease) (HCC)    from radiation  . GERD (gastroesophageal reflux disease)   . Hodgkin's lymphoma ([place])    Treated with radiation and chemo  . Mitral regurgitation    pvc  . Osteoporosis    lumbar verterbral fracture, left ankle fracture, dexa 2015  . Pancreatitis   . Pneumonia   . Prediabetes   . 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 Ulcer    Past Surgical History:  Procedure Laterality Date  . BREAST SURGERY     breast cancer since 2005   left mastectomy  . CARDIAC ELECTROPHYSIOLOGY [place] AND ABLATION  1999  . CATARACT EXTRACTION, BILATERAL    . COLONOSCOPY    . IR RADIOLOGY PERIPHERAL GUIDED IV START  08/03/2017  . IR US GUIDE VASC ACCESS RIGHT  08/03/2017  . LAPAROTOMY    . MASTECTOMY  2005   Left-axillary  . MITRAL VALVE REPAIR Right 09/13/2017   Procedure: MINIMALLY INVASIVE MITRAL VALVE REPLACEMENT (MVR) with Medtronic Mosaic Porcine Heart Valve size 25mm;  Surgeon: Rexene Alberts, MD;  Location: [facility];  Service: Open Heart Surgery;  Laterality: Right;  . ORIF ANKLE FRACTURE Left 03/21/2014   Procedure: LEFT ANKLE FRACTURE OPEN TREATMENT BILMALLEOLAR  INCLUDES INTERNAL FIXATION ;  Surgeon: Renette Butters, MD;  Location: [facility];  Service: Orthopedics;  Laterality: Left;  . PACEMAKER IMPLANT N/A 09/19/2017   Procedure: PACEMAKER IMPLANT;  Surgeon: Thompson Grayer, MD;  Location: [facility];  Service: Cardiovascular;  Laterality: N/A;  . RIGHT/LEFT HEART CATH AND CORONARY ANGIOGRAPHY N/A 06/27/2017   Procedure: Right/Left Heart Cath and Coronary Angiography;  Surgeon: Larey [name], MD;  Location: [place] [facility];  Service: Cardiovascular;  Laterality: N/A;  . 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 TEE WITHOUT CARDIOVERSION  06/12/2012   Procedure: TRANSESOPHAGEAL ECHOCARDIOGRAM (TEE);  Surgeon: Larey [name], MD;  Location: [facility];  Service: Cardiovascular;  Laterality: N/A;  . TEE WITHOUT CARDIOVERSION N/A 06/29/2017   Procedure: TRANSESOPHAGEAL ECHOCARDIOGRAM (TEE);  Surgeon: Jerline Pain, MD;  [facility] END[facility];  Service: Cardiovascular;  Laterality: N/A;  . TEE WITHOUT CARDIOVERSION N/A 09/13/2017   Procedure: TRANSESOPHAGEAL ECHOCARDIOGRAM (TEE);  Surgeon: Rexene Alberts, MD;  Location: [facility];  Service: Open Heart Surgery;  Laterality: N/A;  . TONSILLECTOMY AND ADENOIDECTOMY      ROS- all systems are reviewed and negative except as per HPI above  Current Outpatient Medications  Medication Sig Dispense Refill  . acetaminophen (TYLENOL) 325 MG tablet Take 2 tablets (650 mg total) by mouth 2 (two) times daily as needed for moderate pain or headache.    Marland Kitchen  amiodarone (PACERONE) 200 MG tablet Take 1 tablet (200 mg total) by mouth daily. 10 tablet 0  . aspirin EC 81 MG tablet Take 81 mg by mouth 3 (three) times a week.    . Calcium Carbonate-Vitamin D (CALCIUM PLUS VITAMIN D PO) Take 2 tablets by mouth daily.    . cetirizine (ZYRTEC) 10 MG tablet Take 10 mg by mouth daily as needed for allergies.    Marland Kitchen esomeprazole (NEXIUM) 40 MG capsule Take 40 mg by mouth daily before breakfast.   12  .  furosemide (LASIX) 40 MG tablet Take 1 tablet (40 mg total) by mouth every other day. Ok to take an extra Lasix/furosemide tablet prn for wt gain 3 lbs/day or 5 lbs/week 30 tablet 3  . latanoprost (XALATAN) 0.005 % ophthalmic solution Place 1 drop into both eyes at bedtime.     . lipase/protease/amylase (CREON) 12000 UNITS CPEP capsule Take 1 capsule (12,000 Units total) by mouth 3 (three) times daily with meals. (Patient taking differently: Take 12,000 Units by mouth 3 (three) times daily before meals. ) 270 capsule 1  . metoprolol tartrate (LOPRESSOR) 50 MG tablet TAKE 1 TABLET (50 MG TOTAL) BY MOUTH 2 (TWO) TIMES DAILY. (Patient taking differently: TAKE 1.5 TABLET (50 MG TOTAL) BY MOUTH 2 (TWO) TIMES DAILY.) 180 tablet 3  . Multiple Vitamin (MULTIVITAMIN WITH MINERALS) TABS tablet Take 1 tablet by mouth daily. Centrum Silver.    Glory Rosebush DELICA LANCETS 27X MISC TEST SUGAR ONCE A WEEK  4  . ONETOUCH VERIO test strip CHECK SUGAR ONCE A WEEK  12  . potassium chloride SA (KLOR-CON M20) 20 MEQ tablet Take 1 tablet (20 mEq total) by mouth every other day. Take additional tablet every time you take and extra Lasix/furosemide tablet. 135 tablet 3  . warfarin (COUMADIN) 5 MG tablet Take 1 tablet by mouth daily or as directed by coumadin clinic (Patient taking differently: 7.5 mg. Take 1 tablet by mouth daily or as directed by coumadin clinic) 30 tablet 5   No current facility-administered medications for this visit.     Physical Exam: Vitals:   12/27/17 1047  BP: 126/68  Pulse: 88  Weight: 128 lb (58.1 kg)  Height: 5\' 6"  (1.676 m)    GEN- The patient is well appearing, alert and oriented x 3 today.   Head- normocephalic, atraumatic Eyes-  Sclera clear, conjunctiva pink Ears- hearing intact Oropharynx- clear Lungs- Clear to ausculation bilaterally, normal work of breathing Chest- pacemaker pocket is well healed Heart- Regular rate and rhythm, no murmurs, rubs or gallops, PMI not laterally  displaced GI- soft, NT, ND, + BS Extremities- no clubbing, cyanosis, or edema  Pacemaker interrogation- reviewed in detail today,  See PACEART report  ekg tracing ordered today is personally reviewed and shows sinus rhythm with V pacing  Assessment and Plan:  1. Symptomatic complete heart block Normal pacemaker function See Pace Art report Device dependant today No changes today  2. Post op afib None since hospital discharge by [facility] interrogation Stop amiodarone today Per Dr Guy Sandifer 12/18/17 note (reviewed today), will have her return to see me in 2 months.  If no afib, will stop coumadin then.  3. S/p MVR Doing well Has follow-up with Dr Percival Spanish in February  Thompson Grayer MD, [facility] 12/27/2017 10:53 AM

## 2017-12-27 NOTE — Progress Notes (Signed)
Remote pacemaker transmission.   

## 2017-12-28 LAB — CUP PACEART REMOTE DEVICE CHECK
Battery Remaining Longevity: 126 mo
Battery Voltage: 3.14 V
Brady Statistic AP VP Percent: 0.01 %
Brady Statistic AP VS Percent: 0 %
Brady Statistic AS VP Percent: 99.99 %
Brady Statistic AS VS Percent: 0 %
Brady Statistic RA Percent Paced: 0 %
Brady Statistic RV Percent Paced: 100 %
Date Time Interrogation Session: 20190115032226
Implantable Lead Implant Date: 20181009
Implantable Lead Implant Date: 20181009
Implantable Lead Location: 753859
Implantable Lead Location: 753860
Implantable Lead Model: 5076
Implantable Lead Model: 5076
Implantable Pulse Generator Implant Date: 20181009
Lead Channel Impedance Value: 247 Ohm
Lead Channel Impedance Value: 304 Ohm
Lead Channel Impedance Value: 342 Ohm
Lead Channel Impedance Value: 475 Ohm
Lead Channel Pacing Threshold Amplitude: 0.875 V
Lead Channel Pacing Threshold Amplitude: 0.875 V
Lead Channel Pacing Threshold Pulse Width: 0.4 ms
Lead Channel Pacing Threshold Pulse Width: 0.4 ms
Lead Channel Sensing Intrinsic Amplitude: 1.875 mV
Lead Channel Sensing Intrinsic Amplitude: 1.875 mV
Lead Channel Sensing Intrinsic Amplitude: 8.25 mV
Lead Channel Setting Pacing Amplitude: 3.5 V
Lead Channel Setting Pacing Amplitude: 3.5 V
Lead Channel Setting Pacing Pulse Width: 0.4 ms
Lead Channel Setting Sensing Sensitivity: 2 mV

## 2017-12-29 ENCOUNTER — Encounter (HOSPITAL_COMMUNITY)
Admission: RE | Admit: 2017-12-29 | Discharge: 2017-12-29 | Disposition: A | Discharge status: Home / Self Care | Payer: Medicare Other | Source: Ambulatory Visit | Attending: Cardiology | Admitting: Cardiology

## 2017-12-29 ENCOUNTER — Encounter: Payer: Self-pay | Admitting: Cardiology

## 2017-12-29 ENCOUNTER — Ambulatory Visit (INDEPENDENT_AMBULATORY_CARE_PROVIDER_SITE_OTHER): Payer: Medicare Other | Admitting: Pharmacist Clinician (PhC)/ Clinical Pharmacy Specialist

## 2017-12-29 DIAGNOSIS — Z7901 Long term (current) use of anticoagulants: Secondary | ICD-10-CM

## 2017-12-29 DIAGNOSIS — Z953 Presence of xenogenic heart valve: Secondary | ICD-10-CM

## 2017-12-29 DIAGNOSIS — Z952 Presence of prosthetic heart valve: Secondary | ICD-10-CM

## 2017-12-29 DIAGNOSIS — I4892 Unspecified atrial flutter: Secondary | ICD-10-CM | POA: Diagnosis not present

## 2017-12-29 LAB — POCT INR: INR: 2

## 2017-12-29 NOTE — Patient Instructions (Addendum)
  Description   Continue with 1.5 tablets daily.  Repeat INR in 2 weeks

## 2018-01-01 ENCOUNTER — Encounter (HOSPITAL_COMMUNITY)
Admission: RE | Admit: 2018-01-01 | Discharge: 2018-01-01 | Disposition: A | Discharge status: Home / Self Care | Payer: Medicare Other | Source: Ambulatory Visit | Attending: Cardiology | Admitting: Cardiology

## 2018-01-01 DIAGNOSIS — Z952 Presence of prosthetic heart valve: Secondary | ICD-10-CM

## 2018-01-02 NOTE — Progress Notes (Signed)
Cardiac Individual Treatment Plan  Patient Details  Name: Patricia [name] MRN: [id number] Date of Birth: 10/06/1951 Referring Provider:     [facility] from 10/31/2017 in [place]  Referring Provider  Minus Breeding MD      Initial Encounter Date:    CARDIAC REHAB PHASE II ORIENTATION from 10/31/2017 in [place]  Date  10/31/17  Referring Provider  Minus Breeding MD      Visit Diagnosis: S/P MVR (mitral valve replacement) 09/13/17  Patient's Home Medications on Admission:  Current Outpatient Medications:  .  acetaminophen (TYLENOL) 325 MG tablet, Take 2 tablets (650 mg total) by mouth 2 (two) times daily as needed for moderate pain or headache., Disp: , Rfl:  .  aspirin EC 81 MG tablet, Take 81 mg by mouth 3 (three) times a week., Disp: , Rfl:  .  Calcium Carbonate-Vitamin D (CALCIUM PLUS VITAMIN D PO), Take 2 tablets by mouth daily., Disp: , Rfl:  .  cetirizine (ZYRTEC) 10 MG tablet, Take 10 mg by mouth daily as needed for allergies., Disp: , Rfl:  .  esomeprazole (NEXIUM) 40 MG capsule, Take 40 mg by mouth daily before breakfast. , Disp: , Rfl: 12 .  furosemide (LASIX) 40 MG tablet, Take 1 tablet (40 mg total) by mouth every other day. Ok to take an extra Lasix/furosemide tablet prn for wt gain 3 lbs/day or 5 lbs/week, Disp: 30 tablet, Rfl: 3 .  latanoprost (XALATAN) 0.005 % ophthalmic solution, Place 1 drop into both eyes at bedtime. , Disp: , Rfl:  .  lipase/protease/amylase (CREON) 12000 UNITS CPEP capsule, Take 1 capsule (12,000 Units total) by mouth 3 (three) times daily with meals. (Patient taking differently: Take 12,000 Units by mouth 3 (three) times daily before meals. ), Disp: 270 capsule, Rfl: 1 .  metoprolol tartrate (LOPRESSOR) 50 MG tablet, TAKE 1 TABLET (50 MG TOTAL) BY MOUTH 2 (TWO) TIMES DAILY. (Patient taking differently: TAKE 1.5 TABLET (50 MG TOTAL) BY MOUTH 2 (TWO) TIMES  DAILY.), Disp: 180 tablet, Rfl: 3 .  Multiple Vitamin (MULTIVITAMIN WITH MINERALS) TABS tablet, Take 1 tablet by mouth daily. Centrum Silver., Disp: , Rfl:  .  ONETOUCH DELICA LANCETS 26R MISC, TEST SUGAR ONCE A WEEK, Disp: , Rfl: 4 .  ONETOUCH VERIO test strip, CHECK SUGAR ONCE A WEEK, Disp: , Rfl: 12 .  potassium chloride SA (KLOR-CON M20) 20 MEQ tablet, Take 1 tablet (20 mEq total) by mouth every other day. Take additional tablet every time you take and extra Lasix/furosemide tablet., Disp: 135 tablet, Rfl: 3 .  warfarin (COUMADIN) 5 MG tablet, Take 1 tablet by mouth daily or as directed by coumadin clinic (Patient taking differently: 7.5 mg. Take 1 tablet by mouth daily or as directed by coumadin clinic), Disp: 30 tablet, Rfl: 5  Past Medical History: Past Medical History:  Diagnosis Date  . Atrial flutter (HCC)    Ablated 1998 Dr. Caryl Comes  . Bleeding gastric ulcer    back in 2000  . Breast cancer ([place])   . COPD (chronic obstructive pulmonary disease) (HCC)    from radiation  . GERD (gastroesophageal reflux disease)   . Hodgkin's lymphoma ([place])    Treated with radiation and chemo  . Mitral regurgitation    pvc  . Osteoporosis    lumbar verterbral fracture, left ankle fracture, dexa 2015  . Pancreatitis   . Pneumonia   . Prediabetes   . 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 Ulcer     Tobacco Use: Social History   Tobacco Use  Smoking Status Never Smoker  Smokeless Tobacco Never Used    Labs: Recent Review Flowsheet Data    Labs for ITP Cardiac and Pulmonary Rehab Latest Ref Rng & Units 09/14/2017 09/14/2017 09/14/2017 09/15/2017 09/16/2017   Cholestrol 125 - 200 mg/dL - - - - -   LDLCALC <130 mg/dL - - - - -   HDL >=46 mg/dL - - - - -   Trlycerides <150 mg/dL - - - - -   Hemoglobin A1c 4.8 - 5.6 % - - - - -   PHART 7.350 - 7.450 7.332(L) 7.353 - - -    PCO2ART 32.0 - 48.0 mmHg 40.4 42.5 - - -   HCO3 20.0 - 28.0 mmol/L 21.4 23.6 - - -   TCO2 22 - 32 mmol/L 23 25 24  - -   ACIDBASEDEF 0.0 - 2.0 mmol/L 4.0(H) 2.0 - - -   O2SAT % 99.0 98.0 - 63.6 75.1      Capillary Blood Glucose: Lab Results  Component Value Date   GLUCAP 132 (H) 11/06/2017   GLUCAP 138 (H) 09/17/2017   GLUCAP 132 (H) 09/17/2017   GLUCAP 149 (H) 09/17/2017   GLUCAP 119 (H) 09/17/2017     Exercise Target Goals:    Exercise Program Goal: Individual exercise prescription set using results from initial 6 min walk test and THRR while considering  patient's activity barriers and safety.   Exercise Prescription Goal: Initial exercise prescription builds to 30-45 minutes a day of aerobic activity, 2-3 days per week.  Home exercise guidelines will be given to patient during program as part of exercise prescription that the participant will acknowledge.  Activity Barriers & Risk Stratification: Activity Barriers & Cardiac Risk Stratification - 10/31/17 0904      Activity Barriers & Cardiac Risk Stratification   Activity Barriers  None    Cardiac Risk Stratification  High       6 Minute Walk: 6 Minute Walk    Row Name 10/31/17 1157         6 Minute Walk   Phase  Initial     Distance  1354 feet     Walk Time  6 minutes     # of Rest Breaks  0     MPH  2.56     METS  3.2     RPE  13     VO2 Peak  11.34     Symptoms  Yes (comment)     Comments  lightheadedness; orthostatics      Resting HR  86 bpm     Resting BP  122/82     Resting Oxygen Saturation   97 %     Exercise Oxygen Saturation  during 6 min walk  100 %     Max Ex. HR  105 bpm     Max Ex. BP  92/60 148/72; 134/70     2 Minute Post BP  104/70        Oxygen Initial Assessment:   Oxygen Re-Evaluation:   Oxygen Discharge (Final Oxygen Re-Evaluation):   Initial Exercise Prescription: Initial Exercise Prescription - 10/31/17 1200      Date of Initial Exercise RX and Referring Provider    Date  10/31/17    Referring Provider  Minus Breeding MD  Treadmill   MPH  2.5    Grade  1    Minutes  10    METs  3.26      Bike   Level  0.7    Minutes  10    METs  3.2      NuStep   Level  3    SPM  80    Minutes  10    METs  2.5      Prescription Details   Frequency (times per week)  3    Duration  Progress to 30 minutes of continuous aerobic without signs/symptoms of physical distress      Intensity   THRR 40-80% of Max Heartrate  67-122    Ratings of Perceived Exertion  11-13    Perceived Dyspnea  0-4      Progression   Progression  Continue to progress workloads to maintain intensity without signs/symptoms of physical distress.      Resistance Training   Training Prescription  Yes    Weight  3lbs    Reps  10-15       Perform Capillary Blood Glucose checks as needed.  Exercise Prescription Changes:  Exercise Prescription Changes    Row Name 11/08/17 0948 11/24/17 1100 12/06/17 1100 12/25/17 0948       Response to Exercise   Blood Pressure (Admit)  120/70  102/72  112/76  138/78    Blood Pressure (Exercise)  134/80  108/62  104/70  128/70    Blood Pressure (Exit)  120/70  122/68  120/70  123/70    Heart Rate (Admit)  94 bpm  89 bpm  89 bpm  87 bpm    Heart Rate (Exercise)  104 bpm  97 bpm  102 bpm  102 bpm    Heart Rate (Exit)  92 bpm  89 bpm  89 bpm  89 bpm    Rating of Perceived Exertion (Exercise)  13  12  12  12     Symptoms  none  none  none  none    Duration  Progress to 30 minutes of  aerobic without signs/symptoms of physical distress  Continue with 30 min of aerobic exercise without signs/symptoms of physical distress.  Continue with 30 min of aerobic exercise without signs/symptoms of physical distress.  Continue with 30 min of aerobic exercise without signs/symptoms of physical distress.    Intensity  THRR unchanged  -  -  THRR unchanged      Progression   Progression  Continue to progress workloads to maintain intensity without  signs/symptoms of physical distress.  -  -  Continue to progress workloads to maintain intensity without signs/symptoms of physical distress.    Average METs  2.9  2.5  2.8  2.8      Resistance Training   Training Prescription  No Relaxation today  Yes  Yes  Yes    Weight  -  3lbs  3lbs  4lbs    Reps  -  10-15  10-15  10-15    Time  -  10 Minutes  10 Minutes  10 Minutes      Interval Training   Interval Training  No  No  No  No      Treadmill   MPH  -  -  -  -    Grade  -  -  -  -    Minutes  -  -  -  -    METs  -  -  -  -  Bike   Level  0.7  -  -  -    Minutes  10  -  -  -    METs  3.22  -  -  -      Recumbant Bike   Level  -  1  2  2     Minutes  -  10  10  10     METs  -  1.8  2.4  2.3      NuStep   Level  3  3  3  4     SPM  80  80  80  80    Minutes  10  10  10  10     METs  2.6  2.6  2.9  2.9      Track   Laps  10  12  12  13     Minutes  10  10  10  10     METs  2.74  3.09  3.09  3.26      Home Exercise Plan   Plans to continue exercise at  -  Home (comment)  Home (comment)  Home (comment)    Frequency  -  Add 3 additional days to program exercise sessions.  Add 3 additional days to program exercise sessions.  Add 3 additional days to program exercise sessions.    Initial Home Exercises Provided  -  11/15/17  11/15/17  11/15/17       Exercise Comments:  Exercise Comments    Row Name 11/08/17 1000 11/15/17 1009 11/24/17 0950 12/06/17 1025 12/25/17 0949   Exercise Comments  Reviewed METs with patient  Reviewed home exercise with patient.  Reviewed METs and goals with patient.  Reviewed METs with patient.  Reviewed METs and goals with patient.      Exercise Goals and Review:  Exercise Goals    Row Name 10/31/17 0904             Exercise Goals   Increase Physical Activity  Yes       Intervention  Provide advice, education, support and counseling about physical activity/exercise needs.;Develop an individualized exercise prescription for aerobic and  resistive training based on initial evaluation findings, risk stratification, comorbidities and participant's personal goals.       Expected Outcomes  Achievement of increased cardiorespiratory fitness and enhanced flexibility, muscular endurance and strength shown through measurements of functional capacity and personal statement of participant.       Increase Strength and Stamina  Yes       Intervention  Provide advice, education, support and counseling about physical activity/exercise needs.;Develop an individualized exercise prescription for aerobic and resistive training based on initial evaluation findings, risk stratification, comorbidities and participant's personal goals.       Expected Outcomes  Achievement of increased cardiorespiratory fitness and enhanced flexibility, muscular endurance and strength shown through measurements of functional capacity and personal statement of participant.       Able to understand and use rate of perceived exertion (RPE) scale  Yes       Intervention  Provide education and explanation on how to use RPE scale       Expected Outcomes  Short Term: Able to use RPE daily in rehab to express subjective intensity level;Long Term:  Able to use RPE to guide intensity level when exercising independently       Knowledge and understanding of Target Heart Rate Range (THRR)  Yes       Intervention  Provide  education and explanation of THRR including how the numbers were predicted and where they are located for reference       Expected Outcomes  Short Term: Able to state/look up THRR;Long Term: Able to use THRR to govern intensity when exercising independently;Short Term: Able to use daily as guideline for intensity in rehab       Able to check pulse independently  Yes       Intervention  Provide education and demonstration on how to check pulse in carotid and radial arteries.;Review the importance of being able to check your own pulse for safety during independent exercise        Expected Outcomes  Short Term: Able to explain why pulse checking is important during independent exercise;Long Term: Able to check pulse independently and accurately       Understanding of Exercise Prescription  Yes       Intervention  Provide education, explanation, and written materials on patient's individual exercise prescription       Expected Outcomes  Short Term: Able to explain program exercise prescription;Long Term: Able to explain home exercise prescription to exercise independently          Exercise Goals Re-Evaluation : Exercise Goals Re-Evaluation    Row Name 11/08/17 1000 11/15/17 1009 11/24/17 0950 12/25/17 0949       Exercise Goal Re-Evaluation   Exercise Goals Review  Increase Physical Activity  Increase Physical Activity;Knowledge and understanding of Target Heart Rate Range (THRR);Able to understand and use rate of perceived exertion (RPE) scale;Understanding of Exercise Prescription  Increase Physical Activity;Increase Strength and Stamina  Increase Strength and Stamina    Comments  Off to a good start with exercise. Switched from treadmill to track due to symptomatic hypotension.  Reviewed home exercise guidelines with patient including THRR, RPE scale and endpoints for exercise. Patient is currently walking 30 minutes, 3 days/week on the days she doesn't attend cardiac rehab.  Patient is doing well with exercise. She is walking and riding her stationary bike at home, 30 mins 2-3 days/week. Patient was feeling SOB using the upright bike, so moved to recumbent bike and tolerated well.  Patient still needs to stop and rest going up hills and inclines and still has some SOB and lightheadedness with exertion but not as bad as before cardiac rehab.    Expected Outcomes  Increase workloads as tolerated.  Continue exercise 30 minutes, 6 days/week to achieve health and fitness goals.  Progress work loads as tolerated to achieve goals of increasing strength and stamina.  Increase  workloads to increase strength and stamina and exercise tolerance.        Discharge Exercise Prescription (Final Exercise Prescription Changes): Exercise Prescription Changes - 12/25/17 0948      Response to Exercise   Blood Pressure (Admit)  138/78    Blood Pressure (Exercise)  128/70    Blood Pressure (Exit)  123/70    Heart Rate (Admit)  87 bpm    Heart Rate (Exercise)  102 bpm    Heart Rate (Exit)  89 bpm    Rating of Perceived Exertion (Exercise)  12    Symptoms  none    Duration  Continue with 30 min of aerobic exercise without signs/symptoms of physical distress.    Intensity  THRR unchanged      Progression   Progression  Continue to progress workloads to maintain intensity without signs/symptoms of physical distress.    Average METs  2.8      Resistance  Training   Training Prescription  Yes    Weight  4lbs    Reps  10-15    Time  10 Minutes      Interval Training   Interval Training  No      Recumbant Bike   Level  2    Minutes  10    METs  2.3      NuStep   Level  4    SPM  80    Minutes  10    METs  2.9      Track   Laps  13    Minutes  10    METs  3.26      Home Exercise Plan   Plans to continue exercise at  Home (comment)    Frequency  Add 3 additional days to program exercise sessions.    Initial Home Exercises Provided  11/15/17       Nutrition:  Target Goals: Understanding of nutrition guidelines, daily intake of sodium 1500mg , cholesterol 200mg , calories 30% from fat and 7% or less from saturated fats, daily to have 5 or more servings of fruits and vegetables.  Biometrics: Pre Biometrics - 10/31/17 1213      Pre Biometrics   Waist Circumference  29 inches    Hip Circumference  36.25 inches    Waist to Hip Ratio  0.8 %    Triceps Skinfold  23 mm    % Body Fat  32.2 %    Grip Strength  25 kg    Flexibility  13 in    Single Leg Stand  4.21 seconds        Nutrition Therapy Plan and Nutrition Goals: Nutrition Therapy & Goals -  10/31/17 1404      Nutrition Therapy   Diet  Heart Healthy      Personal Nutrition Goals   Nutrition Goal  Pt able to name foods that affect blood glucose       Intervention Plan   Intervention  Prescribe, educate and counsel regarding individualized specific dietary modifications aiming towards targeted core components such as weight, hypertension, lipid management, diabetes, heart failure and other comorbidities.    Expected Outcomes  Short Term Goal: Understand basic principles of dietary content, such as calories, fat, sodium, cholesterol and nutrients.;Long Term Goal: Adherence to prescribed nutrition plan.       Nutrition Assessments: Nutrition Assessments - 10/31/17 1403      MEDFICTS Scores   Pre Score  6       Nutrition Goals Re-Evaluation:   Nutrition Goals Re-Evaluation:   Nutrition Goals Discharge (Final Nutrition Goals Re-Evaluation):   Psychosocial: Target Goals: Acknowledge presence or absence of significant depression and/or stress, maximize coping skills, provide positive support system. Participant is able to verbalize types and ability to use techniques and skills needed for reducing stress and depression.  Initial Review & Psychosocial Screening: Initial Psych Review & Screening - 10/31/17 0951      Initial Review   Current issues with  Current Stress Concerns    Source of Stress Concerns  Chronic Illness;Family      Family Dynamics   Good Support System?  Yes friends and neighbors     Concerns  Recent loss of significant other    Comments  lost husband 08/20/2017 from pancreatic cancer       Barriers   Psychosocial barriers to participate in program  There are no identifiable barriers or psychosocial needs.      Screening  Interventions   Interventions  Encouraged to exercise;Provide feedback about the scores to participant       Quality of Life Scores: Quality of Life - 10/31/17 1034      Quality of Life Scores   Health/Function Pre  28.4  %    Socioeconomic Pre  30 %    Psych/Spiritual Pre  30 %    Family Pre  30 %    GLOBAL Pre  29.31 %      Scores of 19 and below usually indicate a poorer quality of life in these areas.  A difference of  2-3 points is a clinically meaningful difference.  A difference of 2-3 points in the total score of the Quality of Life Index has been associated with significant improvement in overall quality of life, self-image, physical symptoms, and general health in studies assessing change in quality of life.  PHQ-9: Recent Review Flowsheet Data    Depression screen [facility] 2/9 11/06/2017   Decreased Interest 0   Down, Depressed, Hopeless 0   PHQ - 2 Score 0     Interpretation of Total Score  Total Score Depression Severity:  1-4 = Minimal depression, 5-9 = Mild depression, 10-14 = Moderate depression, 15-19 = Moderately severe depression, 20-27 = Severe depression   Psychosocial Evaluation and Intervention:   Psychosocial Re-Evaluation: Psychosocial Re-Evaluation    Row Name 12/07/17 0951 01/02/18 1347           Psychosocial Re-Evaluation   Current issues with  Current Stress Concerns  Current Stress Concerns      Comments  Anabia lost her husband in September of this year  Leiah lost her husband in September of this past year      Expected Outcomes  Will continue to offer support as needed  Will continue to offer support as needed      Interventions  Stress management education;Encouraged to attend Cardiac Rehabilitation for the exercise  Stress management education;Encouraged to attend Cardiac Rehabilitation for the exercise      Continue Psychosocial Services   No Follow up required  No Follow up required        Initial Review   Source of Stress Concerns  Family  Family         Psychosocial Discharge (Final Psychosocial Re-Evaluation): Psychosocial Re-Evaluation - 01/02/18 1347      Psychosocial Re-Evaluation   Current issues with  Current Stress Concerns    Comments  Lateka lost  her husband in September of this past year    Expected Outcomes  Will continue to offer support as needed    Interventions  Stress management education;Encouraged to attend Cardiac Rehabilitation for the exercise    Continue Psychosocial Services   No Follow up required      Initial Review   Source of Stress Concerns  Family       Vocational Rehabilitation: Provide vocational rehab assistance to qualifying candidates.   Vocational Rehab Evaluation & Intervention: Vocational Rehab - 10/31/17 0950      Initial Vocational Rehab Evaluation & Intervention   Assessment shows need for Vocational Rehabilitation  -- retired Environmental consultant principal.        Education: Education Goals: Education classes will be provided on a weekly basis, covering required topics. Participant will state understanding/return demonstration of topics presented.  Learning Barriers/Preferences: Learning Barriers/Preferences - 10/31/17 0960      Learning Barriers/Preferences   Learning Barriers  Sight    Learning Preferences  Written Material;Pictoral;Skilled Demonstration;Video;Verbal Instruction  Education Topics: Count Your Pulse:  -Group instruction provided by verbal instruction, demonstration, patient participation and written materials to support subject.  Instructors address importance of being able to find your pulse and how to count your pulse when at home without a heart monitor.  Patients get hands on experience counting their pulse with staff help and individually.   Heart Attack, Angina, and Risk Factor Modification:  -Group instruction provided by verbal instruction, video, and written materials to support subject.  Instructors address signs and symptoms of angina and heart attacks.    Also discuss risk factors for heart disease and how to make changes to improve heart health risk factors.   Functional Fitness:  -Group instruction provided by verbal instruction, demonstration, patient  participation, and written materials to support subject.  Instructors address safety measures for doing things around the house.  Discuss how to get up and down off the floor, how to pick things up properly, how to safely get out of a chair without assistance, and balance training.   Meditation and Mindfulness:  -Group instruction provided by verbal instruction, patient participation, and written materials to support subject.  Instructor addresses importance of mindfulness and meditation practice to help reduce stress and improve awareness.  Instructor also leads participants through a meditation exercise.    Stretching for Flexibility and Mobility:  -Group instruction provided by verbal instruction, patient participation, and written materials to support subject.  Instructors lead participants through series of stretches that are designed to increase flexibility thus improving mobility.  These stretches are additional exercise for major muscle groups that are typically performed during regular warm up and cool down.   Hands Only CPR:  -Group verbal, video, and participation provides a basic overview of AHA guidelines for community CPR. Role-play of emergencies allow participants the opportunity to practice calling for help and chest compression technique with discussion of AED use.   Hypertension: -Group verbal and written instruction that provides a basic overview of hypertension including the most recent diagnostic guidelines, risk factor reduction with self-care instructions and medication management.    Nutrition I class: Heart Healthy Eating:  -Group instruction provided by PowerPoint slides, verbal discussion, and written materials to support subject matter. The instructor gives an explanation and review of the Therapeutic Lifestyle Changes diet recommendations, which includes a discussion on lipid goals, dietary fat, sodium, fiber, plant stanol/sterol esters, sugar, and the components of  a well-balanced, healthy diet.   CARDIAC REHAB PHASE II EXERCISE from 12/20/2017 in [place]  Date  12/08/17  Educator  RD  Instruction Review Code  Not applicable      Nutrition II class: Lifestyle Skills:  -Group instruction provided by PowerPoint slides, verbal discussion, and written materials to support subject matter. The instructor gives an explanation and review of label reading, grocery shopping for heart health, heart healthy recipe modifications, and ways to make healthier choices when eating out.   CARDIAC REHAB PHASE II EXERCISE from 12/20/2017 in [place]  Date  12/08/17  Educator  RD  Instruction Review Code  Not applicable      Diabetes Question & Answer:  -Group instruction provided by PowerPoint slides, verbal discussion, and written materials to support subject matter. The instructor gives an explanation and review of diabetes co-morbidities, pre- and post-prandial blood glucose goals, pre-exercise blood glucose goals, signs, symptoms, and treatment of hypoglycemia and hyperglycemia, and foot care basics.   Diabetes Blitz:  -Group instruction provided by PowerPoint  slides, verbal discussion, and written materials to support subject matter. The instructor gives an explanation and review of the physiology behind type 1 and type 2 diabetes, diabetes medications and rational behind using different medications, pre- and post-prandial blood glucose recommendations and Hemoglobin A1c goals, diabetes diet, and exercise including blood glucose guidelines for exercising safely.    Portion Distortion:  -Group instruction provided by PowerPoint slides, verbal discussion, written materials, and food models to support subject matter. The instructor gives an explanation of serving size versus portion size, changes in portions sizes over the last 20 years, and what consists of a serving from each food group.   Stress  Management:  -Group instruction provided by verbal instruction, video, and written materials to support subject matter.  Instructors review role of stress in heart disease and how to cope with stress positively.     Exercising on Your Own:  -Group instruction provided by verbal instruction, power point, and written materials to support subject.  Instructors discuss benefits of exercise, components of exercise, frequency and intensity of exercise, and end points for exercise.  Also discuss use of nitroglycerin and activating EMS.  Review options of places to exercise outside of rehab.  Review guidelines for sex with heart disease.   Cardiac Drugs I:  -Group instruction provided by verbal instruction and written materials to support subject.  Instructor reviews cardiac drug classes: antiplatelets, anticoagulants, beta blockers, and statins.  Instructor discusses reasons, side effects, and lifestyle considerations for each drug class.   CARDIAC REHAB PHASE II EXERCISE from 12/20/2017 in [place]  Date  12/20/17  Instruction Review Code  2- meets goals/outcomes      Cardiac Drugs II:  -Group instruction provided by verbal instruction and written materials to support subject.  Instructor reviews cardiac drug classes: angiotensin converting enzyme inhibitors (ACE-I), angiotensin II receptor blockers (ARBs), nitrates, and calcium channel blockers.  Instructor discusses reasons, side effects, and lifestyle considerations for each drug class.   Anatomy and Physiology of the Circulatory System:  Group verbal and written instruction and models provide basic cardiac anatomy and physiology, with the coronary electrical and arterial systems. Review of: AMI, Angina, Valve disease, Heart Failure, Peripheral Artery Disease, Cardiac Arrhythmia, Pacemakers, and the ICD.   Other Education:  -Group or individual verbal, written, or video instructions that support the educational  goals of the cardiac rehab program.   Knowledge Questionnaire Score: Knowledge Questionnaire Score - 10/31/17 0949      Knowledge Questionnaire Score   Pre Score  23/24       Core Components/Risk Factors/Patient Goals at Admission: Personal Goals and Risk Factors at Admission - 10/31/17 1236      Core Components/Risk Factors/Patient Goals on Admission   Stress  Yes Patient husband passed away in 10-05-18    Intervention  Offer individual and/or small group education and counseling on adjustment to heart disease, stress management and health-related lifestyle change. Teach and support self-help strategies.;Refer participants experiencing significant psychosocial distress to appropriate mental health specialists for further evaluation and treatment. When possible, include family members and significant others in education/counseling sessions.    Expected Outcomes  Short Term: Participant demonstrates changes in health-related behavior, relaxation and other stress management skills, ability to obtain effective social support, and compliance with psychotropic medications if prescribed.;Long Term: Emotional wellbeing is indicated by absence of clinically significant psychosocial distress or social isolation.       Core Components/Risk Factors/Patient Goals Review:  Goals and Risk Factor  Review    Row Name 12/07/17 0915 12/07/17 0944 01/02/18 1347         Core Components/Risk Factors/Patient Goals Review   Personal Goals Review  Stress  (Pended)   Stress  Stress     Review  -  Graham has not voiced any further stress concerns  [place] has not voiced any further stress concerns     Expected Outcomes  -  Michaelene will continue to attend stress managment classes at cardiac rehab  Naryah will continue to attend stress managment classes at cardiac rehab        Core Components/Risk Factors/Patient Goals at Discharge (Final Review):  Goals and Risk Factor Review - 01/02/18 1347      Core  Components/Risk Factors/Patient Goals Review   Personal Goals Review  Stress    Review  Anjolaoluwa has not voiced any further stress concerns    Expected Outcomes  Shantea will continue to attend stress managment classes at cardiac rehab       ITP Comments: ITP Comments    Row Name 10/31/17 0900 11/10/17 1644 12/07/17 0915 01/02/18 1347     ITP Comments  Medical Director, Dr. Fransico Him  30 day ITP review. Patient with good participation and attendance at cardiac rehab.  30 day ITP review. Patient with good participation and attendance at cardiac rehab.  30 day ITP review. Patient with good participation and attendance at cardiac rehab.       Comments: See ITP comments.Barnet Pall, RN,BSN 01/02/2018 1:51 PM

## 2018-01-03 ENCOUNTER — Encounter: Payer: Medicare Other | Admitting: Internal Medicine

## 2018-01-03 ENCOUNTER — Encounter (HOSPITAL_COMMUNITY)
Admission: RE | Admit: 2018-01-03 | Discharge: 2018-01-03 | Disposition: A | Discharge status: Home / Self Care | Payer: Medicare Other | Source: Ambulatory Visit | Attending: Cardiology | Admitting: Cardiology

## 2018-01-03 DIAGNOSIS — Z952 Presence of prosthetic heart valve: Secondary | ICD-10-CM

## 2018-01-05 ENCOUNTER — Encounter (HOSPITAL_COMMUNITY): Payer: Medicare Other

## 2018-01-08 ENCOUNTER — Encounter (HOSPITAL_COMMUNITY): Payer: Medicare Other

## 2018-01-10 ENCOUNTER — Encounter (HOSPITAL_COMMUNITY): Payer: Medicare Other

## 2018-01-11 ENCOUNTER — Ambulatory Visit: Payer: Medicare Other | Admitting: Cardiology

## 2018-01-12 ENCOUNTER — Encounter (HOSPITAL_COMMUNITY): Payer: Medicare Other

## 2018-01-13 NOTE — Progress Notes (Signed)
HPI The patient presents for evaluation of mitral regurgitation. Transthoracic echo in the past demonstrated moderate to severe mitral regurgitation with mitral valve prolapse and moderate stenosis . However, followup TEE suggested the MR to be moderate. Echo in Jan suggested severe stenosis with moderate  MR and she also had moderate AI.  She is now status post minimally invasive MVR with a bioprosthesis.  In Nov 2018.  She had CHB and required pacemaker placement.  She returns for followup.    She has been doing cardiac rehab for about 2 months.  She still short of breath with things like grocery shopping and carrying the bag.  Her breathing is slowly getting better a cardiac rehab.  She does occasionally have to take a diuretic where she has some trouble lying flat but when she is not retaining fluid she does not describe PND or orthopnea.  She is not describing chest pressure, neck or arm discomfort.  She is not having any cough fevers or chills.  She denies palpitations, presyncope or syncope.  Allergies  Allergen Reactions  . Azithromycin Other (See Comments)    pancreatitis  . Penicillins Anaphylaxis, Hives, Swelling and Other (See Comments)    PATIENT HAS HAD A PCN REACTION WITH IMMEDIATE RASH, FACIAL/TONGUE/THROAT SWELLING, SOB, OR LIGHTHEADEDNESS WITH HYPOTENSION:  #  #  #  YES  #  #  #   HAS PT DEVELOPED SEVERE RASH INVOLVING MUCUS MEMBRANES or SKIN NECROSIS: #  #  #  YES  #  #  #  Has patient had a PCN reaction that required hospitalization: No Has patient had a PCN reaction occurring within the last 10 years: No   . Sulfa Antibiotics Hives and Rash  . Cheese Nausea And Vomiting    Current Outpatient Medications  Medication Sig Dispense Refill  . acetaminophen (TYLENOL) 325 MG tablet Take 2 tablets (650 mg total) by mouth 2 (two) times daily as needed for moderate pain or headache.    Marland Kitchen aspirin EC 81 MG tablet Take 81 mg by mouth 3 (three) times a week.    . Calcium  Carbonate-Vitamin D (CALCIUM PLUS VITAMIN D PO) Take 2 tablets by mouth daily.    . cetirizine (ZYRTEC) 10 MG tablet Take 10 mg by mouth daily as needed for allergies.    Marland Kitchen esomeprazole (NEXIUM) 40 MG capsule Take 40 mg by mouth daily before breakfast.   12  . furosemide (LASIX) 40 MG tablet Take 40 mg by mouth daily.    Marland Kitchen latanoprost (XALATAN) 0.005 % ophthalmic solution Place 1 drop into both eyes at bedtime.     . lipase/protease/amylase (CREON) 12000 UNITS CPEP capsule Take 1 capsule (12,000 Units total) by mouth 3 (three) times daily with meals. (Patient taking differently: Take 12,000 Units by mouth 3 (three) times daily before meals. ) 270 capsule 1  . metoprolol tartrate (LOPRESSOR) 50 MG tablet Take 75 mg by mouth 2 (two) times daily.    . Multiple Vitamin (MULTIVITAMIN WITH MINERALS) TABS tablet Take 1 tablet by mouth daily. Centrum Silver.    Glory Rosebush DELICA LANCETS 03K MISC TEST SUGAR ONCE A WEEK  4  . ONETOUCH VERIO test strip CHECK SUGAR ONCE A WEEK  12  . potassium chloride SA (K-DUR,KLOR-CON) 20 MEQ tablet Take 20 mEq by mouth daily.    Marland Kitchen warfarin (COUMADIN) 5 MG tablet Take 1 tablet by mouth daily or as directed by coumadin clinic (Patient taking differently: 7.5 mg. Take 1 tablet by mouth  daily or as directed by coumadin clinic) 30 tablet 5   No current facility-administered medications for this visit.     Past Medical History:  Diagnosis Date  . Atrial flutter (HCC)    Ablated 1998 Dr. Caryl Comes  . Bleeding gastric ulcer    back in 2000  . Breast cancer ([place])   . COPD (chronic obstructive pulmonary disease) (HCC)    from radiation  . GERD (gastroesophageal reflux disease)   . Hodgkin's lymphoma ([place])    Treated with radiation and chemo  . Mitral regurgitation    pvc  . Osteoporosis    lumbar verterbral fracture, left ankle fracture, dexa 2015  . Pancreatitis   . Pneumonia   . Prediabetes   . 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  09/13/2017   25 mm Medtronic Mosaic porcine stented bioprosthetic tissue valve  . Ulcer     Past Surgical History:  Procedure Laterality Date  . BREAST SURGERY     breast cancer since 2005   left mastectomy  . CARDIAC ELECTROPHYSIOLOGY [place] AND ABLATION  1999  . CATARACT EXTRACTION, BILATERAL    . COLONOSCOPY    . IR RADIOLOGY PERIPHERAL GUIDED IV START  08/03/2017  . IR US GUIDE VASC ACCESS RIGHT  08/03/2017  . LAPAROTOMY    . MASTECTOMY  2005   Left-axillary  . MITRAL VALVE REPAIR Right 09/13/2017   Procedure: MINIMALLY INVASIVE MITRAL VALVE REPLACEMENT (MVR) with Medtronic Mosaic Porcine Heart Valve size 49mm;  Surgeon: Rexene Alberts, MD;  Location: [place];  Service: Open Heart Surgery;  Laterality: Right;  . ORIF ANKLE FRACTURE Left 03/21/2014   Procedure: LEFT ANKLE FRACTURE OPEN TREATMENT BILMALLEOLAR INCLUDES INTERNAL FIXATION ;  Surgeon: Renette Butters, MD;  Location: [place];  Service: Orthopedics;  Laterality: Left;  . PACEMAKER IMPLANT N/A 09/19/2017   Medtronic Azure XT MRI conditional dual-chamber pacemaker for symptomatic complete heart block  by Dr Rayann Heman  . RIGHT/LEFT HEART CATH AND CORONARY ANGIOGRAPHY N/A 06/27/2017   Procedure: Right/Left Heart Cath and Coronary Angiography;  Surgeon: Larey [name], MD;  Location: [place] [facility];  Service: Cardiovascular;  Laterality: N/A;  . 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 TEE WITHOUT CARDIOVERSION  06/12/2012   Procedure: TRANSESOPHAGEAL ECHOCARDIOGRAM (TEE);  Surgeon: Larey [name], MD;  Location: [place];  Service: Cardiovascular;  Laterality: N/A;  . TEE WITHOUT CARDIOVERSION N/A 06/29/2017   Procedure: TRANSESOPHAGEAL ECHOCARDIOGRAM (TEE);  Surgeon: Jerline Pain, MD;  Location: [facility] ENDOSCOPY;  Service: Cardiovascular;  Laterality: N/A;  . TEE WITHOUT CARDIOVERSION N/A 09/13/2017   Procedure: TRANSESOPHAGEAL ECHOCARDIOGRAM (TEE);  Surgeon: Rexene Alberts, MD;  Location: [place];  Service:  Open Heart Surgery;  Laterality: N/A;  . TONSILLECTOMY AND ADENOIDECTOMY       ROS:   As stated in the HPI and negative for all other systems.   PHYSICAL EXAM BP 130/64   Pulse 88   Ht 5\' 6"  (1.676 m)   Wt 131 lb 3.2 oz (59.5 kg)   BMI 21.18 kg/m   GENERAL:  Well appearing NECK:  No jugular venous distention, waveform within normal limits, carotid upstroke brisk and symmetric, no bruits, no thyromegaly LUNGS: Decreased breath sounds at the right base with some crackles CHEST:  Well healed surgical scar.   HEART:  PMI not displaced or sustained,S1 and S2 within normal limits, no S3, no S4, no clicks, no rubs, slight systolic murmur, no diastolic murmurs (timing  of the murmur is somewhat difficult because of tachycardia) ABD:  Flat, positive bowel sounds normal in frequency in pitch, no bruits, no rebound, no guarding, no midline pulsatile mass, no hepatomegaly, no splenomegaly EXT:  2 plus pulses throughout, no edema, no cyanosis no clubbing     GENERAL:  Well appearing NECK:  Positive jugular venous distention, waveform within normal limits, carotid upstroke brisk and symmetric, no bruits, no thyromegaly LUNGS:  Clear to auscultation bilaterally BACK:  No CVA tenderness CHEST:  Unremarkable HEART:  PMI not displaced or sustained,S1 and S2 within normal limits, no S3, no S4, no clicks, no rubs, 3/6 holosystolic murmur at the axilla, left axially diastolic murmur.  Diastolic murmur at the left third intercostal space.   ABD:  Flat, positive bowel sounds normal in frequency in pitch, no bruits, no rebound, no guarding, no midline pulsatile mass, no hepatomegaly, no splenomegaly EXT:  2 plus pulses throughout, no edema, no cyanosis no clubbing   EKG:  NA  ASSESSMENT AND PLAN  MVR: She will continue with the meds as listed.  Of note she did have pleural effusion and still has some shortness of breath and so I am going to repeat a PA and lateral chest x-ray to assess this.  She also  has more tachycardia than I would expect and so I am going to check a TSH as this was off previously.  I would like to increase her metoprolol to 100 mg twice daily.  AI:    I will follow this clinically.    PULMONARY HTN:   Pending the results of the above will decide on the timing of an echocardiogram.  She will need follow-up if this sometime this year certainly prior to seeing Dr. Patrice Paradise.  FLUTTER:  She had fib post op.  She is off of amiodarone.  Stop warfarin in March if there is no evidence of recurrent fibrillation.  She is going to see Dr. Rayann Heman in March and he can stop anticoagulation after this visit  HTN:    This is managed with med changes as above.   CHB:  With PPM.  She is up to date with follow up.

## 2018-01-15 ENCOUNTER — Ambulatory Visit
Admission: RE | Admit: 2018-01-15 | Discharge: 2018-01-15 | Disposition: A | Discharge status: Home / Self Care | Payer: Medicare Other | Source: Ambulatory Visit | Attending: Cardiology | Admitting: Cardiology

## 2018-01-15 ENCOUNTER — Ambulatory Visit (INDEPENDENT_AMBULATORY_CARE_PROVIDER_SITE_OTHER): Payer: Medicare Other | Admitting: Pharmacist Clinician (PhC)/ Clinical Pharmacy Specialist

## 2018-01-15 ENCOUNTER — Ambulatory Visit: Payer: Medicare Other | Admitting: Cardiology

## 2018-01-15 ENCOUNTER — Encounter (HOSPITAL_COMMUNITY): Payer: Medicare Other

## 2018-01-15 ENCOUNTER — Encounter (HOSPITAL_COMMUNITY)
Admission: RE | Admit: 2018-01-15 | Discharge: 2018-01-15 | Disposition: A | Discharge status: Home / Self Care | Payer: Medicare Other | Source: Ambulatory Visit | Attending: Cardiology | Admitting: Cardiology

## 2018-01-15 ENCOUNTER — Encounter: Payer: Self-pay | Admitting: Cardiology

## 2018-01-15 VITALS — BP 130/64 | HR 88 | Ht 66.0 in | Wt 131.2 lb

## 2018-01-15 DIAGNOSIS — I10 Essential (primary) hypertension: Secondary | ICD-10-CM | POA: Diagnosis not present

## 2018-01-15 DIAGNOSIS — Z7901 Long term (current) use of anticoagulants: Secondary | ICD-10-CM | POA: Diagnosis not present

## 2018-01-15 DIAGNOSIS — I4892 Unspecified atrial flutter: Secondary | ICD-10-CM | POA: Diagnosis not present

## 2018-01-15 DIAGNOSIS — J90 Pleural effusion, not elsewhere classified: Secondary | ICD-10-CM

## 2018-01-15 DIAGNOSIS — Z952 Presence of prosthetic heart valve: Secondary | ICD-10-CM | POA: Diagnosis not present

## 2018-01-15 DIAGNOSIS — Z953 Presence of xenogenic heart valve: Secondary | ICD-10-CM

## 2018-01-15 DIAGNOSIS — R5383 Other fatigue: Secondary | ICD-10-CM | POA: Diagnosis not present

## 2018-01-15 LAB — POCT INR
INR: 1.3
INR: 1.3

## 2018-01-15 MED ORDER — METOPROLOL TARTRATE 100 MG PO TABS: 100.0000 mg | ORAL_TABLET | Freq: Two times a day (BID) | ORAL | 3 refills | Status: DC

## 2018-01-15 NOTE — Patient Instructions (Signed)
Medication Instructions:  INCREASE- Metoprolol 100 mg twice a day  If you need a refill on your cardiac medications before your next appointment, please call your pharmacy.  Labwork: TSH Today HERE IN OUR OFFICE AT [facility]  Take the provided lab slips for you to take with you to the lab for you blood draw.   You will NOT need to fast   You may go to any [facility] lab that is convenient for you however, we do have a lab in our office that is able to assist you. You do NOT need an appointment for our lab. Once in our office lobby there is a podium to the right of the check-in desk where you are to sign-in and ring a doorbell to alert [place] you are here. Lab is open Monday-Friday from 8:00am to 4:00pm; and is closed for lunch from 12:45p-1:45pm   Testing/Procedures: A chest x-ray takes a picture of the organs and structures inside the chest, including the heart, lungs, and blood vessels. This test can show several things, including, whether the heart is enlarges; whether fluid is building up in the lungs; and whether pacemaker / defibrillator leads are still in place.   Follow-Up: Your physician wants you to follow-up in: 6 Months. You should receive a reminder letter in the mail two months in advance. If you do not receive a letter, please call our office [phone].   Thank you for choosing [facility]!!

## 2018-01-16 LAB — TSH: TSH: 12.93 u[IU]/mL — ABNORMAL HIGH (ref 0.450–4.500)

## 2018-01-17 ENCOUNTER — Encounter (HOSPITAL_COMMUNITY)
Admission: RE | Admit: 2018-01-17 | Discharge: 2018-01-17 | Disposition: A | Discharge status: Home / Self Care | Payer: Medicare Other | Source: Ambulatory Visit | Attending: Cardiology | Admitting: Cardiology

## 2018-01-17 DIAGNOSIS — Z952 Presence of prosthetic heart valve: Secondary | ICD-10-CM

## 2018-01-19 ENCOUNTER — Encounter (HOSPITAL_COMMUNITY)
Admission: RE | Admit: 2018-01-19 | Discharge: 2018-01-19 | Disposition: A | Discharge status: Home / Self Care | Payer: Medicare Other | Source: Ambulatory Visit | Attending: Cardiology | Admitting: Cardiology

## 2018-01-19 DIAGNOSIS — Z952 Presence of prosthetic heart valve: Secondary | ICD-10-CM

## 2018-01-22 ENCOUNTER — Encounter (HOSPITAL_COMMUNITY)
Admission: RE | Admit: 2018-01-22 | Discharge: 2018-01-22 | Disposition: A | Discharge status: Home / Self Care | Payer: Medicare Other | Source: Ambulatory Visit | Attending: Cardiology | Admitting: Cardiology

## 2018-01-22 DIAGNOSIS — Z952 Presence of prosthetic heart valve: Secondary | ICD-10-CM

## 2018-01-23 ENCOUNTER — Ambulatory Visit: Payer: Medicare Other | Admitting: Family Medicine

## 2018-01-23 VITALS — BP 142/76 | HR 88 | Temp 97.6°F | Resp 16 | Ht 66.0 in | Wt 130.0 lb

## 2018-01-23 DIAGNOSIS — Z23 Encounter for immunization: Secondary | ICD-10-CM | POA: Diagnosis not present

## 2018-01-23 DIAGNOSIS — R7989 Other specified abnormal findings of blood chemistry: Secondary | ICD-10-CM

## 2018-01-23 NOTE — Addendum Note (Signed)
Addended by: Shary Decamp B on: 01/23/2018 10:41 AM   Modules accepted: Orders

## 2018-01-23 NOTE — Progress Notes (Signed)
Subjective:    Patient ID: Patricia [name], female    DOB: 11-May-1951, 67 y.o.   MRN: [id number]  HPI Patient recently underwent minimally invasive mitral valve repair.  Was placed on amiodarone postoperatively and has been using amiodarone for the last 4 months.  Amiodarone was discontinued 2 weeks ago.  At her recent outpatient follow-up with her cardiologist, TSH was checked and found to be greater than 12.  Patient is here today for follow-up.  She denies any recent weight change.  She denies any tachycardia.  She does report cold intolerance.  She does report fatigue.  She does report thinning hair.  She denies constipation.  She denies tremor or tachycardia.  She denies anxiety.  She denies any depression although she is still grieving the death of her husband from pancreatic cancer. Past Medical History:  Diagnosis Date  . Atrial flutter (HCC)    Ablated 1998 Dr. Caryl Comes  . Bleeding gastric ulcer    back in 2000  . Breast cancer ([place])   . COPD (chronic obstructive pulmonary disease) (HCC)    from radiation  . GERD (gastroesophageal reflux disease)   . Hodgkin's lymphoma ([place])    Treated with radiation and chemo  . Mitral regurgitation    pvc  . Osteoporosis    lumbar verterbral fracture, left ankle fracture, dexa 2015  . Pancreatitis   . Pneumonia   . Prediabetes   . 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 Ulcer    Past Surgical History:  Procedure Laterality Date  . BREAST SURGERY     breast cancer since 2005   left mastectomy  . CARDIAC ELECTROPHYSIOLOGY [place] AND ABLATION  1999  . CATARACT EXTRACTION, BILATERAL    . COLONOSCOPY    . IR RADIOLOGY PERIPHERAL GUIDED IV START  08/03/2017  . IR US GUIDE VASC ACCESS RIGHT  08/03/2017  . LAPAROTOMY    . MASTECTOMY  2005   Left-axillary  . MITRAL VALVE REPAIR Right 09/13/2017   Procedure: MINIMALLY INVASIVE  MITRAL VALVE REPLACEMENT (MVR) with Medtronic Mosaic Porcine Heart Valve size 60mm;  Surgeon: Rexene Alberts, MD;  Location: [place];  Service: Open Heart Surgery;  Laterality: Right;  . ORIF ANKLE FRACTURE Left 03/21/2014   Procedure: LEFT ANKLE FRACTURE OPEN TREATMENT BILMALLEOLAR INCLUDES INTERNAL FIXATION ;  Surgeon: Renette Butters, MD;  Location: [place];  Service: Orthopedics;  Laterality: Left;  . PACEMAKER IMPLANT N/A 09/19/2017   Medtronic Azure XT MRI conditional dual-chamber pacemaker for symptomatic complete heart block  by Dr Rayann Heman  . RIGHT/LEFT HEART CATH AND CORONARY ANGIOGRAPHY N/A 06/27/2017   Procedure: Right/Left Heart Cath and Coronary Angiography;  Surgeon: Larey [name], MD;  Location: [place] [facility];  Service: Cardiovascular;  Laterality: N/A;  . 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 TEE WITHOUT CARDIOVERSION  06/12/2012   Procedure: TRANSESOPHAGEAL ECHOCARDIOGRAM (TEE);  Surgeon: Larey [name], MD;  Location: [place];  Service: Cardiovascular;  Laterality: N/A;  . TEE WITHOUT CARDIOVERSION N/A 06/29/2017   Procedure: TRANSESOPHAGEAL ECHOCARDIOGRAM (TEE);  Surgeon: Jerline Pain, MD;  Location: [facility] ENDOSCOPY;  Service: Cardiovascular;  Laterality: N/A;  . TEE WITHOUT CARDIOVERSION N/A 09/13/2017   Procedure: TRANSESOPHAGEAL ECHOCARDIOGRAM (TEE);  Surgeon: Rexene Alberts, MD;  Location: [place];  Service: Open Heart Surgery;  Laterality: N/A;  . TONSILLECTOMY AND ADENOIDECTOMY     Current  Outpatient Medications on File Prior to Visit  Medication Sig Dispense Refill  . acetaminophen (TYLENOL) 325 MG tablet Take 2 tablets (650 mg total) by mouth 2 (two) times daily as needed for moderate pain or headache.    Marland Kitchen aspirin EC 81 MG tablet Take 81 mg by mouth 3 (three) times a week.    . Calcium Carbonate-Vitamin D (CALCIUM PLUS VITAMIN D PO) Take 2 tablets by mouth daily.    . cetirizine (ZYRTEC) 10 MG tablet Take 10 mg by mouth daily as needed for  allergies.    Marland Kitchen esomeprazole (NEXIUM) 40 MG capsule Take 40 mg by mouth daily before breakfast.   12  . furosemide (LASIX) 40 MG tablet Take 40 mg by mouth daily.    Marland Kitchen latanoprost (XALATAN) 0.005 % ophthalmic solution Place 1 drop into both eyes at bedtime.     . lipase/protease/amylase (CREON) 12000 UNITS CPEP capsule Take 1 capsule (12,000 Units total) by mouth 3 (three) times daily with meals. (Patient taking differently: Take 12,000 Units by mouth 3 (three) times daily before meals. ) 270 capsule 1  . metoprolol tartrate (LOPRESSOR) 100 MG tablet Take 1 tablet (100 mg total) by mouth 2 (two) times daily. 180 tablet 3  . Multiple Vitamin (MULTIVITAMIN WITH MINERALS) TABS tablet Take 1 tablet by mouth daily. Centrum Silver.    Glory Rosebush DELICA LANCETS 30Z MISC TEST SUGAR ONCE A WEEK  4  . ONETOUCH VERIO test strip CHECK SUGAR ONCE A WEEK  12  . potassium chloride SA (K-DUR,KLOR-CON) 20 MEQ tablet Take 20 mEq by mouth daily.    Marland Kitchen warfarin (COUMADIN) 5 MG tablet Take 1 tablet by mouth daily or as directed by coumadin clinic (Patient taking differently: 7.5 mg. Take 1 tablet by mouth daily or as directed by coumadin clinic) 30 tablet 5   No current facility-administered medications on file prior to visit.    Allergies  Allergen Reactions  . Azithromycin Other (See Comments)    pancreatitis  . Penicillins Anaphylaxis, Hives, Swelling and Other (See Comments)    PATIENT HAS HAD A PCN REACTION WITH IMMEDIATE RASH, FACIAL/TONGUE/THROAT SWELLING, SOB, OR LIGHTHEADEDNESS WITH HYPOTENSION:  #  #  #  YES  #  #  #   HAS PT DEVELOPED SEVERE RASH INVOLVING MUCUS MEMBRANES or SKIN NECROSIS: #  #  #  YES  #  #  #  Has patient had a PCN reaction that required hospitalization: No Has patient had a PCN reaction occurring within the last 10 years: No   . Sulfa Antibiotics Hives and Rash  . Cheese Nausea And Vomiting   Social History   Socioeconomic History  . Marital status: Widowed    Spouse name: Not  on file  . Number of children: Not on file  . Years of education: Not on file  . Highest education level: Master's degree (e.g., MA, MS, MEng, MEd, MSW, MBA)  Social Needs  . Financial resource strain: Not on file  . Food insecurity - worry: Not on file  . Food insecurity - inability: Not on file  . Transportation needs - medical: Not on file  . Transportation needs - non-medical: Not on file  Occupational History  . Occupation: Pharmacist, hospital (retired)  Tobacco Use  . Smoking status: Never Smoker  . Smokeless tobacco: Never Used  Substance and Sexual Activity  . Alcohol use: No  . Drug use: No  . Sexual activity: Yes  Other Topics Concern  . Not on file  Social History Narrative   Husband passed away from Cancer in 2017-09-09.     Review of Systems  All other systems reviewed and are negative.      Objective:   Physical Exam  Constitutional: She appears well-developed and well-nourished.  Neck: Neck supple. No thyromegaly present.  Cardiovascular: Normal rate, regular rhythm and normal heart sounds.  Pulmonary/Chest: Effort normal and breath sounds normal. No respiratory distress. She has no wheezes. She has no rales.  Vitals reviewed.         Assessment & Plan:  Elevated TSH - Plan: T3, Free, T4, free  Patient has an elevated TSH.  Perhaps this is due to recent use of amiodarone.  I will check free T3 and free T4 levels.  If T3 and T4 levels are low, the patient will benefit from thyroid replacement.  If T3 and T4 however are normal, the patient has subclinical hypothyroidism and I would recommend rechecking a TSH with hormone levels again in 3 months prior to initiating medication.  Patient is comfortable with this plan

## 2018-01-24 ENCOUNTER — Encounter (HOSPITAL_COMMUNITY)
Admission: RE | Admit: 2018-01-24 | Discharge: 2018-01-24 | Disposition: A | Discharge status: Home / Self Care | Payer: Medicare Other | Source: Ambulatory Visit | Attending: Cardiology | Admitting: Cardiology

## 2018-01-24 ENCOUNTER — Ambulatory Visit (INDEPENDENT_AMBULATORY_CARE_PROVIDER_SITE_OTHER): Payer: Medicare Other | Admitting: Pharmacist Clinician (PhC)/ Clinical Pharmacy Specialist

## 2018-01-24 DIAGNOSIS — Z953 Presence of xenogenic heart valve: Secondary | ICD-10-CM

## 2018-01-24 DIAGNOSIS — I4892 Unspecified atrial flutter: Secondary | ICD-10-CM

## 2018-01-24 DIAGNOSIS — Z7901 Long term (current) use of anticoagulants: Secondary | ICD-10-CM | POA: Diagnosis not present

## 2018-01-24 DIAGNOSIS — Z952 Presence of prosthetic heart valve: Secondary | ICD-10-CM | POA: Diagnosis not present

## 2018-01-24 LAB — T4, FREE: Free T4: 1.1 ng/dL (ref 0.8–1.8)

## 2018-01-24 LAB — POCT INR: INR: 1.6

## 2018-01-24 LAB — T3, FREE: T3, Free: 2.8 pg/mL (ref 2.3–4.2)

## 2018-01-25 ENCOUNTER — Encounter: Payer: Self-pay | Admitting: Family Medicine

## 2018-01-26 ENCOUNTER — Encounter (HOSPITAL_COMMUNITY)
Admission: RE | Admit: 2018-01-26 | Discharge: 2018-01-26 | Disposition: A | Discharge status: Home / Self Care | Payer: Medicare Other | Source: Ambulatory Visit | Attending: Cardiology | Admitting: Cardiology

## 2018-01-26 DIAGNOSIS — Z952 Presence of prosthetic heart valve: Secondary | ICD-10-CM | POA: Diagnosis not present

## 2018-01-29 ENCOUNTER — Encounter (HOSPITAL_COMMUNITY)
Admission: RE | Admit: 2018-01-29 | Discharge: 2018-01-29 | Disposition: A | Discharge status: Home / Self Care | Payer: Medicare Other | Source: Ambulatory Visit | Attending: Cardiology | Admitting: Cardiology

## 2018-01-29 DIAGNOSIS — Z952 Presence of prosthetic heart valve: Secondary | ICD-10-CM

## 2018-01-30 NOTE — Progress Notes (Signed)
Cardiac Individual Treatment Plan  Patient Details  Name: Patricia [name] MRN: [id number] Date of Birth: January 01, 1951 Referring Provider:     [facility] from 10/31/2017 in [place]  Referring Provider  Minus Breeding MD      Initial Encounter Date:    CARDIAC REHAB PHASE II ORIENTATION from 10/31/2017 in [place]  Date  10/31/17  Referring Provider  Minus Breeding MD      Visit Diagnosis: S/P MVR (mitral valve replacement) 09/13/17  Patient's Home Medications on Admission:  Current Outpatient Medications:  .  acetaminophen (TYLENOL) 325 MG tablet, Take 2 tablets (650 mg total) by mouth 2 (two) times daily as needed for moderate pain or headache., Disp: , Rfl:  .  aspirin EC 81 MG tablet, Take 81 mg by mouth 3 (three) times a week., Disp: , Rfl:  .  Calcium Carbonate-Vitamin D (CALCIUM PLUS VITAMIN D PO), Take 2 tablets by mouth daily., Disp: , Rfl:  .  cetirizine (ZYRTEC) 10 MG tablet, Take 10 mg by mouth daily as needed for allergies., Disp: , Rfl:  .  esomeprazole (NEXIUM) 40 MG capsule, Take 40 mg by mouth daily before breakfast. , Disp: , Rfl: 12 .  furosemide (LASIX) 40 MG tablet, Take 40 mg by mouth daily., Disp: , Rfl:  .  latanoprost (XALATAN) 0.005 % ophthalmic solution, Place 1 drop into both eyes at bedtime. , Disp: , Rfl:  .  lipase/protease/amylase (CREON) 12000 UNITS CPEP capsule, Take 1 capsule (12,000 Units total) by mouth 3 (three) times daily with meals. (Patient taking differently: Take 12,000 Units by mouth 3 (three) times daily before meals. ), Disp: 270 capsule, Rfl: 1 .  metoprolol tartrate (LOPRESSOR) 100 MG tablet, Take 1 tablet (100 mg total) by mouth 2 (two) times daily., Disp: 180 tablet, Rfl: 3 .  Multiple Vitamin (MULTIVITAMIN WITH MINERALS) TABS tablet, Take 1 tablet by mouth daily. Centrum Silver., Disp: , Rfl:  .  ONETOUCH DELICA LANCETS 50K MISC, TEST SUGAR  ONCE A WEEK, Disp: , Rfl: 4 .  ONETOUCH VERIO test strip, CHECK SUGAR ONCE A WEEK, Disp: , Rfl: 12 .  potassium chloride SA (K-DUR,KLOR-CON) 20 MEQ tablet, Take 20 mEq by mouth daily., Disp: , Rfl:  .  warfarin (COUMADIN) 5 MG tablet, Take 1 tablet by mouth daily or as directed by coumadin clinic (Patient taking differently: 7.5 mg. Take 1 tablet by mouth daily or as directed by coumadin clinic), Disp: 30 tablet, Rfl: 5  Past Medical History: Past Medical History:  Diagnosis Date  . Atrial flutter (HCC)    Ablated 1998 Dr. Caryl Comes  . Bleeding gastric ulcer    back in 2000  . Breast cancer ([place])   . COPD (chronic obstructive pulmonary disease) (HCC)    from radiation  . GERD (gastroesophageal reflux disease)   . Hodgkin's lymphoma ([place])    Treated with radiation and chemo  . Mitral regurgitation    pvc  . Osteoporosis    lumbar verterbral fracture, left ankle fracture, dexa 2015  . Pancreatitis   . Pneumonia   . Prediabetes   . 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 Ulcer     Tobacco Use: Social History   Tobacco Use  Smoking Status Never Smoker  Smokeless Tobacco Never Used    Labs: Recent Review Scientist, physiological  Labs for ITP Cardiac and Pulmonary Rehab Latest Ref Rng & Units 09/14/2017 09/14/2017 09/14/2017 09/15/2017 09/16/2017   Cholestrol 125 - 200 mg/dL - - - - -   LDLCALC <130 mg/dL - - - - -   HDL >=46 mg/dL - - - - -   Trlycerides <150 mg/dL - - - - -   Hemoglobin A1c 4.8 - 5.6 % - - - - -   PHART 7.350 - 7.450 7.332(L) 7.353 - - -   PCO2ART 32.0 - 48.0 mmHg 40.4 42.5 - - -   HCO3 20.0 - 28.0 mmol/L 21.4 23.6 - - -   TCO2 22 - 32 mmol/L 23 25 24  - -   ACIDBASEDEF 0.0 - 2.0 mmol/L 4.0(H) 2.0 - - -   O2SAT % 99.0 98.0 - 63.6 75.1      Capillary Blood Glucose: Lab Results  Component Value Date   GLUCAP 132 (H) 11/06/2017   GLUCAP 138 (H)  09/17/2017   GLUCAP 132 (H) 09/17/2017   GLUCAP 149 (H) 09/17/2017   GLUCAP 119 (H) 09/17/2017     Exercise Target Goals:    Exercise Program Goal: Individual exercise prescription set using results from initial 6 min walk test and THRR while considering  patient's activity barriers and safety.   Exercise Prescription Goal: Initial exercise prescription builds to 30-45 minutes a day of aerobic activity, 2-3 days per week.  Home exercise guidelines will be given to patient during program as part of exercise prescription that the participant will acknowledge.  Activity Barriers & Risk Stratification: Activity Barriers & Cardiac Risk Stratification - 10/31/17 0904      Activity Barriers & Cardiac Risk Stratification   Activity Barriers  None    Cardiac Risk Stratification  High       6 Minute Walk: 6 Minute Walk    Row Name 10/31/17 1157 01/29/18 1025       6 Minute Walk   Phase  Initial  Discharge    Distance  1354 feet  1400 feet    Distance % Change  -  3.4 %    Walk Time  6 minutes  6 minutes    # of Rest Breaks  0  0    MPH  2.56  2.65    METS  3.2  3.33    RPE  13  12    VO2 Peak  11.34  11.66    Symptoms  Yes (comment)  No    Comments  lightheadedness; orthostatics   -    Resting HR  86 bpm  83 bpm    Resting BP  122/82  118/78    Resting Oxygen Saturation   97 %  -    Exercise Oxygen Saturation  during 6 min walk  100 %  -    Max Ex. HR  105 bpm  96 bpm    Max Ex. BP  92/60 148/72; 134/70  102/70 148/72; 134/70    2 Minute Post BP  104/70  118/72       Oxygen Initial Assessment:   Oxygen Re-Evaluation:   Oxygen Discharge (Final Oxygen Re-Evaluation):   Initial Exercise Prescription: Initial Exercise Prescription - 10/31/17 1200      Date of Initial Exercise RX and Referring Provider   Date  10/31/17    Referring Provider  Minus Breeding MD      Treadmill   MPH  2.5    Grade  1    Minutes  10  METs  3.26      Bike   Level  0.7     Minutes  10    METs  3.2      NuStep   Level  3    SPM  80    Minutes  10    METs  2.5      Prescription Details   Frequency (times per week)  3    Duration  Progress to 30 minutes of continuous aerobic without signs/symptoms of physical distress      Intensity   THRR 40-80% of Max Heartrate  67-122    Ratings of Perceived Exertion  11-13    Perceived Dyspnea  0-4      Progression   Progression  Continue to progress workloads to maintain intensity without signs/symptoms of physical distress.      Resistance Training   Training Prescription  Yes    Weight  3lbs    Reps  10-15       Perform Capillary Blood Glucose checks as needed.  Exercise Prescription Changes:  Exercise Prescription Changes    Row Name 11/08/17 0948 11/24/17 1100 12/06/17 1100 12/25/17 0948 01/15/18 1130     Response to Exercise   Blood Pressure (Admit)  120/70  102/72  112/76  138/78  116/64   Blood Pressure (Exercise)  134/80  108/62  104/70  128/70  108/60   Blood Pressure (Exit)  120/70  122/68  120/70  123/70  120/70   Heart Rate (Admit)  94 bpm  89 bpm  89 bpm  87 bpm  92 bpm   Heart Rate (Exercise)  104 bpm  97 bpm  102 bpm  102 bpm  100 bpm   Heart Rate (Exit)  92 bpm  89 bpm  89 bpm  89 bpm  90 bpm   Rating of Perceived Exertion (Exercise)  13  12  12  12  13    Symptoms  none  none  none  none  none   Duration  Progress to 30 minutes of  aerobic without signs/symptoms of physical distress  Continue with 30 min of aerobic exercise without signs/symptoms of physical distress.  Continue with 30 min of aerobic exercise without signs/symptoms of physical distress.  Continue with 30 min of aerobic exercise without signs/symptoms of physical distress.  Continue with 30 min of aerobic exercise without signs/symptoms of physical distress.   Intensity  THRR unchanged  -  -  THRR unchanged  THRR unchanged     Progression   Progression  Continue to progress workloads to maintain intensity without  signs/symptoms of physical distress.  -  -  Continue to progress workloads to maintain intensity without signs/symptoms of physical distress.  Continue to progress workloads to maintain intensity without signs/symptoms of physical distress.   Average METs  2.9  2.5  2.8  2.8  2.8     Resistance Training   Training Prescription  No Relaxation today  Yes  Yes  Yes  Yes   Weight  -  3lbs  3lbs  4lbs  4lbs   Reps  -  10-15  10-15  10-15  10-15   Time  -  10 Minutes  10 Minutes  10 Minutes  10 Minutes     Interval Training   Interval Training  No  No  No  No  No     Treadmill   MPH  -  -  -  -  -   Grade  -  -  -  -  -  Minutes  -  -  -  -  -   METs  -  -  -  -  -     Bike   Level  0.7  -  -  -  -   Minutes  10  -  -  -  -   METs  3.22  -  -  -  -     Recumbant Bike   Level  -  1  2  2  2    Minutes  -  10  10  10  10    METs  -  1.8  2.4  2.3  2.4     NuStep   Level  3  3  3  4  4    SPM  80  80  80  80  80   Minutes  10  10  10  10  10    METs  2.6  2.6  2.9  2.9  2.9     Track   Laps  10  12  12  13  12    Minutes  10  10  10  10  10    METs  2.74  3.09  3.09  3.26  3.09     Home Exercise Plan   Plans to continue exercise at  -  Home (comment)  Home (comment)  Home (comment)  Home (comment)   Frequency  -  Add 3 additional days to program exercise sessions.  Add 3 additional days to program exercise sessions.  Add 3 additional days to program exercise sessions.  Add 3 additional days to program exercise sessions.   Initial Home Exercises Provided  -  11/15/17  11/15/17  11/15/17  11/15/17   Row Name 01/22/18 0948             Response to Exercise   Blood Pressure (Admit)  124/70       Blood Pressure (Exercise)  128/68       Blood Pressure (Exit)  112/78       Heart Rate (Admit)  84 bpm       Heart Rate (Exercise)  99 bpm       Heart Rate (Exit)  90 bpm       Rating of Perceived Exertion (Exercise)  12       Symptoms  none       Duration  Continue with 30 min of  aerobic exercise without signs/symptoms of physical distress.       Intensity  THRR unchanged         Progression   Progression  Continue to progress workloads to maintain intensity without signs/symptoms of physical distress.       Average METs  2.9         Resistance Training   Training Prescription  Yes       Weight  4lbs       Reps  10-15       Time  10 Minutes         Interval Training   Interval Training  No         Recumbant Bike   Level  2.5       Minutes  10       METs  2.5         NuStep   Level  4       SPM  80       Minutes  10  METs  3.1         Track   Laps  12       Minutes  10       METs  3.09         Home Exercise Plan   Plans to continue exercise at  Home (comment)       Frequency  Add 3 additional days to program exercise sessions.       Initial Home Exercises Provided  11/15/17          Exercise Comments:  Exercise Comments    Row Name 11/08/17 1000 11/15/17 1009 11/24/17 0950 12/06/17 1025 12/25/17 0949   Exercise Comments  Reviewed METs with patient  Reviewed home exercise with patient.  Reviewed METs and goals with patient.  Reviewed METs with patient.  Reviewed METs and goals with patient.   [place] Name 01/15/18 1130 01/22/18 0948 01/29/18 1025       Exercise Comments  Reviewed METs with patient.  Reviewed METs and goals with patient.  Patient is currently walking 15 minutes and riding her elliptical bike 15 minutes daily in addition to exercise at [facility]. Patient is interested in participating in the cardiac maintenance program for a month upon completion of the phase 2 program. Referral sent.        Exercise Goals and Review:  Exercise Goals    Row Name 10/31/17 0904             Exercise Goals   Increase Physical Activity  Yes       Intervention  Provide advice, education, support and counseling about physical activity/exercise needs.;Develop an individualized exercise prescription for aerobic and resistive training based on  initial evaluation findings, risk stratification, comorbidities and participant's personal goals.       Expected Outcomes  Achievement of increased cardiorespiratory fitness and enhanced flexibility, muscular endurance and strength shown through measurements of functional capacity and personal statement of participant.       Increase Strength and Stamina  Yes       Intervention  Provide advice, education, support and counseling about physical activity/exercise needs.;Develop an individualized exercise prescription for aerobic and resistive training based on initial evaluation findings, risk stratification, comorbidities and participant's personal goals.       Expected Outcomes  Achievement of increased cardiorespiratory fitness and enhanced flexibility, muscular endurance and strength shown through measurements of functional capacity and personal statement of participant.       Able to understand and use rate of perceived exertion (RPE) scale  Yes       Intervention  Provide education and explanation on how to use RPE scale       Expected Outcomes  Short Term: Able to use RPE daily in rehab to express subjective intensity level;Long Term:  Able to use RPE to guide intensity level when exercising independently       Knowledge and understanding of Target Heart Rate Range (THRR)  Yes       Intervention  Provide education and explanation of THRR including how the numbers were predicted and where they are located for reference       Expected Outcomes  Short Term: Able to state/look up THRR;Long Term: Able to use THRR to govern intensity when exercising independently;Short Term: Able to use daily as guideline for intensity in rehab       Able to check pulse independently  Yes       Intervention  Provide education and demonstration on how to  check pulse in carotid and radial arteries.;Review the importance of being able to check your own pulse for safety during independent exercise       Expected Outcomes   Short Term: Able to explain why pulse checking is important during independent exercise;Long Term: Able to check pulse independently and accurately       Understanding of Exercise Prescription  Yes       Intervention  Provide education, explanation, and written materials on patient's individual exercise prescription       Expected Outcomes  Short Term: Able to explain program exercise prescription;Long Term: Able to explain home exercise prescription to exercise independently          Exercise Goals Re-Evaluation : Exercise Goals Re-Evaluation    Row Name 11/08/17 1000 11/15/17 1009 11/24/17 0950 12/25/17 0949 01/22/18 0948     Exercise Goal Re-Evaluation   Exercise Goals Review  Increase Physical Activity  Increase Physical Activity;Knowledge and understanding of Target Heart Rate Range (THRR);Able to understand and use rate of perceived exertion (RPE) scale;Understanding of Exercise Prescription  Increase Physical Activity;Increase Strength and Stamina  Increase Strength and Stamina  Understanding of Exercise Prescription   Comments  Off to a good start with exercise. Switched from treadmill to track due to symptomatic hypotension.  Reviewed home exercise guidelines with patient including THRR, RPE scale and endpoints for exercise. Patient is currently walking 30 minutes, 3 days/week on the days she doesn't attend cardiac rehab.  Patient is doing well with exercise. She is walking and riding her stationary bike at home, 30 mins 2-3 days/week. Patient was feeling SOB using the upright bike, so moved to recumbent bike and tolerated well.  Patient still needs to stop and rest going up hills and inclines and still has some SOB and lightheadedness with exertion but not as bad as before cardiac rehab.  Patient is walking and/or using her elliptical machine at home 30-40 minutes daily for her home exercise.   Expected Outcomes  Increase workloads as tolerated.  Continue exercise 30 minutes, 6 days/week to  achieve health and fitness goals.  Progress work loads as tolerated to achieve goals of increasing strength and stamina.  Increase workloads to increase strength and stamina and exercise tolerance.  Continue daily exercise routine to maintain health and fitness benefits.       Discharge Exercise Prescription (Final Exercise Prescription Changes): Exercise Prescription Changes - 01/22/18 0948      Response to Exercise   Blood Pressure (Admit)  124/70    Blood Pressure (Exercise)  128/68    Blood Pressure (Exit)  112/78    Heart Rate (Admit)  84 bpm    Heart Rate (Exercise)  99 bpm    Heart Rate (Exit)  90 bpm    Rating of Perceived Exertion (Exercise)  12    Symptoms  none    Duration  Continue with 30 min of aerobic exercise without signs/symptoms of physical distress.    Intensity  THRR unchanged      Progression   Progression  Continue to progress workloads to maintain intensity without signs/symptoms of physical distress.    Average METs  2.9      Resistance Training   Training Prescription  Yes    Weight  4lbs    Reps  10-15    Time  10 Minutes      Interval Training   Interval Training  No      Recumbant Bike   Level  2.5  Minutes  10    METs  2.5      NuStep   Level  4    SPM  80    Minutes  10    METs  3.1      Track   Laps  12    Minutes  10    METs  3.09      Home Exercise Plan   Plans to continue exercise at  Home (comment)    Frequency  Add 3 additional days to program exercise sessions.    Initial Home Exercises Provided  11/15/17       Nutrition:  Target Goals: Understanding of nutrition guidelines, daily intake of sodium 1500mg , cholesterol 200mg , calories 30% from fat and 7% or less from saturated fats, daily to have 5 or more servings of fruits and vegetables.  Biometrics: Pre Biometrics - 10/31/17 1213      Pre Biometrics   Waist Circumference  29 inches    Hip Circumference  36.25 inches    Waist to Hip Ratio  0.8 %    Triceps  Skinfold  23 mm    % Body Fat  32.2 %    Grip Strength  25 kg    Flexibility  13 in    Single Leg Stand  4.21 seconds      Post Biometrics - 01/29/18 1025       Post  Biometrics   Waist Circumference  28 inches    Hip Circumference  36 inches    Waist to Hip Ratio  0.78 %    Triceps Skinfold  27 mm    Grip Strength  25.5 kg    Flexibility  15 in    Single Leg Stand  5.55 seconds       Nutrition Therapy Plan and Nutrition Goals: Nutrition Therapy & Goals - 10/31/17 1404      Nutrition Therapy   Diet  Heart Healthy      Personal Nutrition Goals   Nutrition Goal  Pt able to name foods that affect blood glucose       Intervention Plan   Intervention  Prescribe, educate and counsel regarding individualized specific dietary modifications aiming towards targeted core components such as weight, hypertension, lipid management, diabetes, heart failure and other comorbidities.    Expected Outcomes  Short Term Goal: Understand basic principles of dietary content, such as calories, fat, sodium, cholesterol and nutrients.;Long Term Goal: Adherence to prescribed nutrition plan.       Nutrition Assessments: Nutrition Assessments - 10/31/17 1403      MEDFICTS Scores   Pre Score  6       Nutrition Goals Re-Evaluation:   Nutrition Goals Re-Evaluation:   Nutrition Goals Discharge (Final Nutrition Goals Re-Evaluation):   Psychosocial: Target Goals: Acknowledge presence or absence of significant depression and/or stress, maximize coping skills, provide positive support system. Participant is able to verbalize types and ability to use techniques and skills needed for reducing stress and depression.  Initial Review & Psychosocial Screening: Initial Psych Review & Screening - 10/31/17 0951      Initial Review   Current issues with  Current Stress Concerns    Source of Stress Concerns  Chronic Illness;Family      Family Dynamics   Good Support System?  Yes friends and neighbors      Concerns  Recent loss of significant other    Comments  lost husband 08/20/2017 from pancreatic cancer       Barriers  Psychosocial barriers to participate in program  There are no identifiable barriers or psychosocial needs.      Screening Interventions   Interventions  Encouraged to exercise;Provide feedback about the scores to participant       Quality of Life Scores: Quality of Life - 10/31/17 1034      Quality of Life Scores   Health/Function Pre  28.4 %    Socioeconomic Pre  30 %    Psych/Spiritual Pre  30 %    Family Pre  30 %    GLOBAL Pre  29.31 %      Scores of 19 and below usually indicate a poorer quality of life in these areas.  A difference of  2-3 points is a clinically meaningful difference.  A difference of 2-3 points in the total score of the Quality of Life Index has been associated with significant improvement in overall quality of life, self-image, physical symptoms, and general health in studies assessing change in quality of life.  PHQ-9: Recent Review Flowsheet Data    Depression screen [facility] 2/9 01/23/2018 11/06/2017   Decreased Interest 1 0   Down, Depressed, Hopeless 1 0   PHQ - 2 Score 2 0   Altered sleeping 2 -   Tired, decreased energy 1 -   Change in appetite 0 -   Feeling bad or failure about yourself  0 -   Trouble concentrating 0 -   Moving slowly or fidgety/restless 0 -   Suicidal thoughts 0 -   PHQ-9 Score 5 -   Difficult doing work/chores Not difficult at all -     Interpretation of Total Score  Total Score Depression Severity:  1-4 = Minimal depression, 5-9 = Mild depression, 10-14 = Moderate depression, 15-19 = Moderately severe depression, 20-27 = Severe depression   Psychosocial Evaluation and Intervention:   Psychosocial Re-Evaluation: Psychosocial Re-Evaluation    Row Name 12/07/17 0951 01/02/18 1347 01/30/18 1350         Psychosocial Re-Evaluation   Current issues with  Current Stress Concerns  Current Stress Concerns   Current Stress Concerns     Comments  Chelby lost her husband in September of this year  Quinne lost her husband in September of this past year  Raetta lost her husband in September of this past year     Expected Outcomes  Will continue to offer support as needed  Will continue to offer support as needed  Will continue to offer support as needed     Interventions  Stress management education;Encouraged to attend Cardiac Rehabilitation for the exercise  Stress management education;Encouraged to attend Cardiac Rehabilitation for the exercise  Stress management education;Encouraged to attend Cardiac Rehabilitation for the exercise     Continue Psychosocial Services   No Follow up required  No Follow up required  No Follow up required       Initial Review   Source of Stress Concerns  Family  Family  Family        Psychosocial Discharge (Final Psychosocial Re-Evaluation): Psychosocial Re-Evaluation - 01/30/18 1350      Psychosocial Re-Evaluation   Current issues with  Current Stress Concerns    Comments  Rilyn lost her husband in September of this past year    Expected Outcomes  Will continue to offer support as needed    Interventions  Stress management education;Encouraged to attend Cardiac Rehabilitation for the exercise    Continue Psychosocial Services   No Follow up required  Initial Review   Source of Stress Concerns  Family       Vocational Rehabilitation: Provide vocational rehab assistance to qualifying candidates.   Vocational Rehab Evaluation & Intervention: Vocational Rehab - 10/31/17 0950      Initial Vocational Rehab Evaluation & Intervention   Assessment shows need for Vocational Rehabilitation  -- retired Environmental consultant principal.        Education: Education Goals: Education classes will be provided on a weekly basis, covering required topics. Participant will state understanding/return demonstration of topics presented.  Learning Barriers/Preferences: Learning  Barriers/Preferences - 10/31/17 6387      Learning Barriers/Preferences   Learning Barriers  Sight    Learning Preferences  Written Material;Pictoral;Skilled Demonstration;Video;Verbal Instruction       Education Topics: Count Your Pulse:  -Group instruction provided by verbal instruction, demonstration, patient participation and written materials to support subject.  Instructors address importance of being able to find your pulse and how to count your pulse when at home without a heart monitor.  Patients get hands on experience counting their pulse with staff help and individually.   Heart Attack, Angina, and Risk Factor Modification:  -Group instruction provided by verbal instruction, video, and written materials to support subject.  Instructors address signs and symptoms of angina and heart attacks.    Also discuss risk factors for heart disease and how to make changes to improve heart health risk factors.   Functional Fitness:  -Group instruction provided by verbal instruction, demonstration, patient participation, and written materials to support subject.  Instructors address safety measures for doing things around the house.  Discuss how to get up and down off the floor, how to pick things up properly, how to safely get out of a chair without assistance, and balance training.   Meditation and Mindfulness:  -Group instruction provided by verbal instruction, patient participation, and written materials to support subject.  Instructor addresses importance of mindfulness and meditation practice to help reduce stress and improve awareness.  Instructor also leads participants through a meditation exercise.    Stretching for Flexibility and Mobility:  -Group instruction provided by verbal instruction, patient participation, and written materials to support subject.  Instructors lead participants through series of stretches that are designed to increase flexibility thus improving mobility.   These stretches are additional exercise for major muscle groups that are typically performed during regular warm up and cool down.   Hands Only CPR:  -Group verbal, video, and participation provides a basic overview of AHA guidelines for community CPR. Role-play of emergencies allow participants the opportunity to practice calling for help and chest compression technique with discussion of AED use.   Hypertension: -Group verbal and written instruction that provides a basic overview of hypertension including the most recent diagnostic guidelines, risk factor reduction with self-care instructions and medication management.    Nutrition I class: Heart Healthy Eating:  -Group instruction provided by PowerPoint slides, verbal discussion, and written materials to support subject matter. The instructor gives an explanation and review of the Therapeutic Lifestyle Changes diet recommendations, which includes a discussion on lipid goals, dietary fat, sodium, fiber, plant stanol/sterol esters, sugar, and the components of a well-balanced, healthy diet.   CARDIAC REHAB PHASE II EXERCISE from 01/26/2018 in [place]  Date  12/08/17  Educator  RD      Nutrition II class: Lifestyle Skills:  -Group instruction provided by PowerPoint slides, verbal discussion, and written materials to support subject matter. The instructor gives an explanation  and review of label reading, grocery shopping for heart health, heart healthy recipe modifications, and ways to make healthier choices when eating out.   CARDIAC REHAB PHASE II EXERCISE from 01/26/2018 in [place]  Date  12/08/17  Educator  RD      Diabetes Question & Answer:  -Group instruction provided by PowerPoint slides, verbal discussion, and written materials to support subject matter. The instructor gives an explanation and review of diabetes co-morbidities, pre- and post-prandial blood  glucose goals, pre-exercise blood glucose goals, signs, symptoms, and treatment of hypoglycemia and hyperglycemia, and foot care basics.   Diabetes Blitz:  -Group instruction provided by PowerPoint slides, verbal discussion, and written materials to support subject matter. The instructor gives an explanation and review of the physiology behind type 1 and type 2 diabetes, diabetes medications and rational behind using different medications, pre- and post-prandial blood glucose recommendations and Hemoglobin A1c goals, diabetes diet, and exercise including blood glucose guidelines for exercising safely.    Portion Distortion:  -Group instruction provided by PowerPoint slides, verbal discussion, written materials, and food models to support subject matter. The instructor gives an explanation of serving size versus portion size, changes in portions sizes over the last 20 years, and what consists of a serving from each food group.   Stress Management:  -Group instruction provided by verbal instruction, video, and written materials to support subject matter.  Instructors review role of stress in heart disease and how to cope with stress positively.     CARDIAC REHAB PHASE II EXERCISE from 01/26/2018 in [place]  Date  01/17/18      Exercising on Your Own:  -Group instruction provided by verbal instruction, power point, and written materials to support subject.  Instructors discuss benefits of exercise, components of exercise, frequency and intensity of exercise, and end points for exercise.  Also discuss use of nitroglycerin and activating EMS.  Review options of places to exercise outside of rehab.  Review guidelines for sex with heart disease.   Cardiac Drugs I:  -Group instruction provided by verbal instruction and written materials to support subject.  Instructor reviews cardiac drug classes: antiplatelets, anticoagulants, beta blockers, and statins.  Instructor  discusses reasons, side effects, and lifestyle considerations for each drug class.   CARDIAC REHAB PHASE II EXERCISE from 01/26/2018 in [place]  Date  12/20/17  Instruction Review Code  1- Verbalizes Understanding      Cardiac Drugs II:  -Group instruction provided by verbal instruction and written materials to support subject.  Instructor reviews cardiac drug classes: angiotensin converting enzyme inhibitors (ACE-I), angiotensin II receptor blockers (ARBs), nitrates, and calcium channel blockers.  Instructor discusses reasons, side effects, and lifestyle considerations for each drug class.   CARDIAC REHAB PHASE II EXERCISE from 01/26/2018 in [place]  Date  01/24/18  Educator  pharmacy  Instruction Review Code  2- Demonstrated Understanding      Anatomy and Physiology of the Circulatory System:  Group verbal and written instruction and models provide basic cardiac anatomy and physiology, with the coronary electrical and arterial systems. Review of: AMI, Angina, Valve disease, Heart Failure, Peripheral Artery Disease, Cardiac Arrhythmia, Pacemakers, and the ICD.   Other Education:  -Group or individual verbal, written, or video instructions that support the educational goals of the cardiac rehab program.   Holiday Eating Survival Tips:  -Group instruction provided by PowerPoint slides, verbal discussion, and written  materials to support subject matter. The instructor gives patients tips, tricks, and techniques to help them not only survive but enjoy the holidays despite the onslaught of food that accompanies the holidays.   Knowledge Questionnaire Score: Knowledge Questionnaire Score - 10/31/17 0949      Knowledge Questionnaire Score   Pre Score  23/24       Core Components/Risk Factors/Patient Goals at Admission: Personal Goals and Risk Factors at Admission - 10/31/17 1236      Core Components/Risk  Factors/Patient Goals on Admission   Stress  Yes Patient husband passed away in 09/19/18    Intervention  Offer individual and/or small group education and counseling on adjustment to heart disease, stress management and health-related lifestyle change. Teach and support self-help strategies.;Refer participants experiencing significant psychosocial distress to appropriate mental health specialists for further evaluation and treatment. When possible, include family members and significant others in education/counseling sessions.    Expected Outcomes  Short Term: Participant demonstrates changes in health-related behavior, relaxation and other stress management skills, ability to obtain effective social support, and compliance with psychotropic medications if prescribed.;Long Term: Emotional wellbeing is indicated by absence of clinically significant psychosocial distress or social isolation.       Core Components/Risk Factors/Patient Goals Review:  Goals and Risk Factor Review    Row Name 12/07/17 0915 12/07/17 0944 01/02/18 1347 01/30/18 1349       Core Components/Risk Factors/Patient Goals Review   Personal Goals Review  Stress  (Pended)   Stress  Stress  Stress    Review  -  Braelynn has not voiced any further stress concerns  Azaliah has not voiced any further stress concerns  Alisse has not voiced any further stress concerns    Expected Outcomes  -  Kailin will continue to attend stress managment classes at cardiac rehab  Kamarah will continue to attend stress managment classes at cardiac rehab  Anetria will continue to attend stress managment classes at cardiac rehab and partipate in relaxation sessions       Core Components/Risk Factors/Patient Goals at Discharge (Final Review):  Goals and Risk Factor Review - 01/30/18 1349      Core Components/Risk Factors/Patient Goals Review   Personal Goals Review  Stress    Review  Carylon has not voiced any further stress concerns    Expected Outcomes   Taje will continue to attend stress managment classes at cardiac rehab and partipate in relaxation sessions       ITP Comments: ITP Comments    Row Name 10/31/17 0900 11/10/17 1644 12/07/17 0915 01/02/18 1347 01/30/18 1349   ITP Comments  Medical Director, Dr. Fransico Him  30 day ITP review. Patient with good participation and attendance at cardiac rehab.  30 day ITP review. Patient with good participation and attendance at cardiac rehab.  30 day ITP review. Patient with good participation and attendance at cardiac rehab.  30 day ITP review. Patient with good participation and attendance at cardiac rehab.      Comments: See ITP comments.Barnet Pall, RN,BSN 01/30/2018 1:59 PM

## 2018-01-31 ENCOUNTER — Encounter (HOSPITAL_COMMUNITY)
Admission: RE | Admit: 2018-01-31 | Discharge: 2018-01-31 | Disposition: A | Discharge status: Home / Self Care | Payer: Medicare Other | Source: Ambulatory Visit | Attending: Cardiology | Admitting: Cardiology

## 2018-01-31 DIAGNOSIS — Z952 Presence of prosthetic heart valve: Secondary | ICD-10-CM

## 2018-02-02 ENCOUNTER — Encounter (HOSPITAL_COMMUNITY)
Admission: RE | Admit: 2018-02-02 | Discharge: 2018-02-02 | Disposition: A | Discharge status: Home / Self Care | Payer: Medicare Other | Source: Ambulatory Visit | Attending: Cardiology | Admitting: Cardiology

## 2018-02-02 DIAGNOSIS — Z952 Presence of prosthetic heart valve: Secondary | ICD-10-CM

## 2018-02-05 ENCOUNTER — Other Ambulatory Visit: Payer: Self-pay | Admitting: *Deleted

## 2018-02-05 ENCOUNTER — Encounter (HOSPITAL_COMMUNITY)
Admission: RE | Admit: 2018-02-05 | Discharge: 2018-02-05 | Disposition: A | Discharge status: Home / Self Care | Payer: Medicare Other | Source: Ambulatory Visit | Attending: Cardiology | Admitting: Cardiology

## 2018-02-05 DIAGNOSIS — Z952 Presence of prosthetic heart valve: Secondary | ICD-10-CM

## 2018-02-05 MED ORDER — ONETOUCH VERIO VI STRP: ORAL_STRIP | 12 refills | Status: DC

## 2018-02-05 MED ORDER — ONETOUCH DELICA LANCETS 33G MISC: 4 refills | Status: DC

## 2018-02-07 ENCOUNTER — Ambulatory Visit (INDEPENDENT_AMBULATORY_CARE_PROVIDER_SITE_OTHER): Payer: Medicare Other | Admitting: Pharmacist Clinician (PhC)/ Clinical Pharmacy Specialist

## 2018-02-07 ENCOUNTER — Encounter (HOSPITAL_COMMUNITY)
Admission: RE | Admit: 2018-02-07 | Discharge: 2018-02-07 | Disposition: A | Discharge status: Home / Self Care | Payer: Medicare Other | Source: Ambulatory Visit | Attending: Cardiology | Admitting: Cardiology

## 2018-02-07 DIAGNOSIS — Z953 Presence of xenogenic heart valve: Secondary | ICD-10-CM

## 2018-02-07 DIAGNOSIS — Z7901 Long term (current) use of anticoagulants: Secondary | ICD-10-CM | POA: Diagnosis not present

## 2018-02-07 DIAGNOSIS — Z952 Presence of prosthetic heart valve: Secondary | ICD-10-CM

## 2018-02-07 DIAGNOSIS — I4892 Unspecified atrial flutter: Secondary | ICD-10-CM

## 2018-02-07 LAB — POCT INR: INR: 1.6

## 2018-02-07 MED ORDER — WARFARIN SODIUM 5 MG PO TABS: ORAL_TABLET | ORAL | 5 refills | Status: DC

## 2018-02-07 NOTE — Patient Instructions (Signed)
Description   Take 2.5 tablets today Wednesday Feb 27, then increase dose to 2 tablets daily except 1.5 tablets each Sunday, Tuesday and Thursday.  Repeat INR in 2 weeks

## 2018-02-09 ENCOUNTER — Encounter (HOSPITAL_COMMUNITY)
Admission: RE | Admit: 2018-02-09 | Discharge: 2018-02-09 | Disposition: A | Discharge status: Home / Self Care | Payer: Medicare Other | Source: Ambulatory Visit | Attending: Cardiology | Admitting: Cardiology

## 2018-02-09 VITALS — BP 104/62 | HR 88 | Ht 65.75 in | Wt 128.5 lb

## 2018-02-09 DIAGNOSIS — Z952 Presence of prosthetic heart valve: Secondary | ICD-10-CM | POA: Insufficient documentation

## 2018-02-09 NOTE — Progress Notes (Signed)
Discharge Progress Report  Patient Details  Name: Patricia [name] MRN: [id number] Date of Birth: [birth year] Referring Provider:     Leslie from 10/31/2017 in [place]  Referring Provider  Minus Breeding MD       Number of Visits: 36  Reason for Discharge:  Patient independent in their exercise.  Smoking History:  Social History   Tobacco Use  Smoking Status Never Smoker  Smokeless Tobacco Never Used    Diagnosis:  S/P MVR (mitral valve replacement) 09/13/17  ADL [facility]SD:   Initial Exercise Prescription: Initial Exercise Prescription - 10/31/17 1200      Date of Initial Exercise RX and Referring Provider   Date  10/31/17    Referring Provider  Minus Breeding MD      Treadmill   MPH  2.5    Grade  1    Minutes  10    METs  3.26      Bike   Level  0.7    Minutes  10    METs  3.2      NuStep   Level  3    SPM  80    Minutes  10    METs  2.5      Prescription Details   Frequency (times per week)  3    Duration  Progress to 30 minutes of continuous aerobic without signs/symptoms of physical distress      Intensity   THRR 40-80% of Max Heartrate  67-122    Ratings of Perceived Exertion  11-13    Perceived Dyspnea  0-4      Progression   Progression  Continue to progress workloads to maintain intensity without signs/symptoms of physical distress.      Resistance Training   Training Prescription  Yes    Weight  3lbs    Reps  10-15       Discharge Exercise Prescription (Final Exercise Prescription Changes): Exercise Prescription Changes - 02/09/18 0948      Response to Exercise   Blood Pressure (Admit)  104/62    Blood Pressure (Exercise)  110/60    Blood Pressure (Exit)  110/70    Heart Rate (Admit)  88 bpm    Heart Rate (Exercise)  101 bpm    Heart Rate (Exit)  93 bpm    Rating of Perceived Exertion (Exercise)  12    Symptoms  none    Duration  Continue with 30 min of aerobic  exercise without signs/symptoms of physical distress.    Intensity  THRR unchanged      Progression   Progression  Continue to progress workloads to maintain intensity without signs/symptoms of physical distress.    Average METs  3      Resistance Training   Training Prescription  Yes    Weight  4lbs    Reps  10-15    Time  10 Minutes      Interval Training   Interval Training  No      Recumbant Bike   Level  3    Minutes  10    METs  2.5      NuStep   Level  4    SPM  80    Minutes  10    METs  3      Track   Laps  14    Minutes  10    METs  3.43  Home Exercise Plan   Plans to continue exercise at  Home (comment)    Frequency  Add 3 additional days to program exercise sessions.    Initial Home Exercises Provided  11/15/17       Functional Capacity: 6 Minute Walk    Row Name 10/31/17 1157 01/29/18 1025       6 Minute Walk   Phase  Initial  Discharge    Distance  1354 feet  1400 feet    Distance % Change  -  3.4 %    Walk Time  6 minutes  6 minutes    # of Rest Breaks  0  0    MPH  2.56  2.65    METS  3.2  3.33    RPE  13  12    VO2 Peak  11.34  11.66    Symptoms  Yes (comment)  No    Comments  lightheadedness; orthostatics   -    Resting HR  86 bpm  83 bpm    Resting BP  122/82  118/78    Resting Oxygen Saturation   97 %  -    Exercise Oxygen Saturation  during 6 min walk  100 %  -    Max Ex. HR  105 bpm  96 bpm    Max Ex. BP  92/60 148/72; 134/70  102/70 148/72; 134/70    2 Minute Post BP  104/70  118/72       Psychological, QOL, Others - Outcomes: PHQ 2/9: Depression screen [facility] 2/9 02/09/2018 01/23/2018 11/06/2017  Decreased Interest 0 1 0  Down, Depressed, Hopeless 0 1 0  PHQ - 2 Score 0 2 0  Altered sleeping - 2 -  Tired, decreased energy - 1 -  Change in appetite - 0 -  Feeling bad or failure about yourself  - 0 -  Trouble concentrating - 0 -  Moving slowly or fidgety/restless - 0 -  Suicidal thoughts - 0 -  PHQ-9 Score - 5 -   Difficult doing work/chores - Not difficult at all -    Quality of Life: Quality of Life - 02/09/18 1100      Quality of Life Scores   Health/Function Pre  28.4 %    Health/Function Post  28.4 %    Health/Function % Change  0 %    Socioeconomic Pre  30 %    Socioeconomic Post  30 %    Socioeconomic % Change   0 %    Psych/Spiritual Pre  30 %    Psych/Spiritual Post  30 %    Psych/Spiritual % Change  0 %    Family Pre  30 %    Family Post  30 %    Family % Change  0 %    GLOBAL Pre  29.31 %    GLOBAL Post  29.25 %    GLOBAL % Change  -0.2 %       Personal Goals: Goals established at orientation with interventions provided to work toward goal. Personal Goals and Risk Factors at Admission - 10/31/17 1236      Core Components/Risk Factors/Patient Goals on Admission   Stress  Yes Patient husband passed away in September 07, 2018    Intervention  Offer individual and/or small group education and counseling on adjustment to heart disease, stress management and health-related lifestyle change. Teach and support self-help strategies.;Refer participants experiencing significant psychosocial distress to appropriate mental health specialists for further evaluation  and treatment. When possible, include family members and significant others in education/counseling sessions.    Expected Outcomes  Short Term: Participant demonstrates changes in health-related behavior, relaxation and other stress management skills, ability to obtain effective social support, and compliance with psychotropic medications if prescribed.;Long Term: Emotional wellbeing is indicated by absence of clinically significant psychosocial distress or social isolation.        Personal Goals Discharge: Goals and Risk Factor Review    Row Name 12/07/17 0915 12/07/17 0944 01/02/18 1347 01/30/18 1349 02/09/18 1023     Core Components/Risk Factors/Patient Goals Review   Personal Goals Review  Stress  (Pended)   Stress  Stress   Stress  Stress   Review  -  Patricia [name] has not voiced any further stress concerns  Patricia [name] has not voiced any further stress concerns  Patricia [name] has not voiced any further stress concerns  Patricia [name] has not voiced any further stress concerns   Expected Outcomes  -  Patricia [name] will continue to attend stress managment classes at cardiac rehab  Patricia [name] will continue to attend stress managment classes at cardiac rehab  Patricia [name] will continue to attend stress managment classes at cardiac rehab and partipate in relaxation sessions  Patricia [name] will continue to take her medications as presribed and exercise in the maintenance program upon discharge then golds gym with Silver Sneakers      Exercise Goals and Review: Exercise Goals    Row Name 10/31/17 0904             Exercise Goals   Increase Physical Activity  Yes       Intervention  Provide advice, education, support and counseling about physical activity/exercise needs.;Develop an individualized exercise prescription for aerobic and resistive training based on initial evaluation findings, risk stratification, comorbidities and participant's personal goals.       Expected Outcomes  Achievement of increased cardiorespiratory fitness and enhanced flexibility, muscular endurance and strength shown through measurements of functional capacity and personal statement of participant.       Increase Strength and Stamina  Yes       Intervention  Provide advice, education, support and counseling about physical activity/exercise needs.;Develop an individualized exercise prescription for aerobic and resistive training based on initial evaluation findings, risk stratification, comorbidities and participant's personal goals.       Expected Outcomes  Achievement of increased cardiorespiratory fitness and enhanced flexibility, muscular endurance and strength shown through measurements of functional capacity and personal statement of participant.       Able to understand and use rate of perceived  exertion (RPE) scale  Yes       Intervention  Provide education and explanation on how to use RPE scale       Expected Outcomes  Short Term: Able to use RPE daily in rehab to express subjective intensity level;Long Term:  Able to use RPE to guide intensity level when exercising independently       Knowledge and understanding of Target Heart Rate Range (THRR)  Yes       Intervention  Provide education and explanation of THRR including how the numbers were predicted and where they are located for reference       Expected Outcomes  Short Term: Able to state/look up THRR;Long Term: Able to use THRR to govern intensity when exercising independently;Short Term: Able to use daily as guideline for intensity in rehab       Able to check pulse independently  Yes       Intervention  Provide education and demonstration on how to check pulse in carotid and radial arteries.;Review the importance of being able to check your own pulse for safety during independent exercise       Expected Outcomes  Short Term: Able to explain why pulse checking is important during independent exercise;Long Term: Able to check pulse independently and accurately       Understanding of Exercise Prescription  Yes       Intervention  Provide education, explanation, and written materials on patient's individual exercise prescription       Expected Outcomes  Short Term: Able to explain program exercise prescription;Long Term: Able to explain home exercise prescription to exercise independently          Nutrition & Weight - Outcomes: Pre Biometrics - 10/31/17 1213      Pre Biometrics   Waist Circumference  29 inches    Hip Circumference  36.25 inches    Waist to Hip Ratio  0.8 %    Triceps Skinfold  23 mm    % Body Fat  32.2 %    Grip Strength  25 kg    Flexibility  13 in    Single Leg Stand  4.21 seconds      Post Biometrics - 02/09/18 1100       Post  Biometrics   Height  5' 5.75" (1.67 m)    Weight  128 lb 8.5 oz (58.3  kg)    Waist Circumference  28 inches    Hip Circumference  36 inches    Waist to Hip Ratio  0.78 %    BMI (Calculated)  20.9    Triceps Skinfold  27 mm    % Body Fat  32.6 %    Grip Strength  25.5 kg    Flexibility  15 in    Single Leg Stand  5.55 seconds       Nutrition: Nutrition Therapy & Goals - 10/31/17 1404      Nutrition Therapy   Diet  Heart Healthy      Personal Nutrition Goals   Nutrition Goal  Pt able to name foods that affect blood glucose       Intervention Plan   Intervention  Prescribe, educate and counsel regarding individualized specific dietary modifications aiming towards targeted core components such as weight, hypertension, lipid management, diabetes, heart failure and other comorbidities.    Expected Outcomes  Short Term Goal: Understand basic principles of dietary content, such as calories, fat, sodium, cholesterol and nutrients.;Long Term Goal: Adherence to prescribed nutrition plan.       Nutrition Discharge: Nutrition Assessments - 03/05/18 1032      MEDFICTS Scores   Pre Score  6    Post Score  12    Score Difference  6       Education Questionnaire Score: Knowledge Questionnaire Score - 02/09/18 1100      Knowledge Questionnaire Score   Pre Score  23/24    Post Score  23/24       Goals reviewed with patient; copy given to patient. Patricia [name] graduated from cardiac rehab program today with completion of 36 exercise sessions in Phase II. Pt maintained good attendance and progressed nicely during his participation in rehab as evidenced by increased MET level.   Medication list reconciled. Repeat  PHQ score- 0 .  Pt has made significant lifestyle changes and should be commended for her success. Pt feels she has achieved her goals during cardiac rehab.  Pt plans to continue exercise in cardiac maintenance program for a month then plans to continue exercise at [facility] through  [employer].Barnet Pall, RN,BSN 03/05/2018 11:12 AM

## 2018-02-12 ENCOUNTER — Encounter (HOSPITAL_COMMUNITY)
Admission: RE | Admit: 2018-02-12 | Discharge: 2018-02-12 | Disposition: A | Discharge status: Home / Self Care | Payer: Medicare Other | Source: Ambulatory Visit | Attending: Cardiology | Admitting: Cardiology

## 2018-02-12 DIAGNOSIS — Z953 Presence of xenogenic heart valve: Secondary | ICD-10-CM | POA: Diagnosis present

## 2018-02-14 ENCOUNTER — Encounter (HOSPITAL_COMMUNITY)
Admission: RE | Admit: 2018-02-14 | Discharge: 2018-02-14 | Disposition: A | Discharge status: Home / Self Care | Payer: Medicare Other | Source: Ambulatory Visit | Attending: Cardiology | Admitting: Cardiology

## 2018-02-16 ENCOUNTER — Encounter (HOSPITAL_COMMUNITY)
Admission: RE | Admit: 2018-02-16 | Discharge: 2018-02-16 | Disposition: A | Discharge status: Home / Self Care | Payer: Medicare Other | Source: Ambulatory Visit | Attending: Cardiology | Admitting: Cardiology

## 2018-02-19 ENCOUNTER — Encounter (HOSPITAL_COMMUNITY)
Admission: RE | Admit: 2018-02-19 | Discharge: 2018-02-19 | Disposition: A | Discharge status: Home / Self Care | Payer: Medicare Other | Source: Ambulatory Visit | Attending: Cardiology | Admitting: Cardiology

## 2018-02-21 ENCOUNTER — Encounter (HOSPITAL_COMMUNITY)
Admission: RE | Admit: 2018-02-21 | Discharge: 2018-02-21 | Disposition: A | Discharge status: Home / Self Care | Payer: Medicare Other | Source: Ambulatory Visit | Attending: Cardiology | Admitting: Cardiology

## 2018-02-21 ENCOUNTER — Ambulatory Visit (INDEPENDENT_AMBULATORY_CARE_PROVIDER_SITE_OTHER): Payer: Medicare Other | Admitting: Pharmacist Clinician (PhC)/ Clinical Pharmacy Specialist

## 2018-02-21 DIAGNOSIS — Z7901 Long term (current) use of anticoagulants: Secondary | ICD-10-CM | POA: Diagnosis not present

## 2018-02-21 DIAGNOSIS — Z953 Presence of xenogenic heart valve: Secondary | ICD-10-CM

## 2018-02-21 LAB — POCT INR: INR: 1.5

## 2018-02-21 NOTE — Patient Instructions (Signed)
Description   Take 2.5 tablets today Wednesday 02/21/18, then increase dose to 2 tablets daily except 1.5 tablets each Sunday and Thursday.  Repeat INR in 2 weeks.

## 2018-02-23 ENCOUNTER — Encounter (HOSPITAL_COMMUNITY)
Admission: RE | Admit: 2018-02-23 | Discharge: 2018-02-23 | Disposition: A | Discharge status: Home / Self Care | Payer: Medicare Other | Source: Ambulatory Visit | Attending: Cardiology | Admitting: Cardiology

## 2018-02-24 ENCOUNTER — Other Ambulatory Visit: Payer: Self-pay | Admitting: Physician Assistant

## 2018-02-26 ENCOUNTER — Encounter (HOSPITAL_COMMUNITY)
Admission: RE | Admit: 2018-02-26 | Discharge: 2018-02-26 | Disposition: A | Discharge status: Home / Self Care | Payer: Medicare Other | Source: Ambulatory Visit | Attending: Cardiology | Admitting: Cardiology

## 2018-02-28 ENCOUNTER — Encounter (HOSPITAL_COMMUNITY)
Admission: RE | Admit: 2018-02-28 | Discharge: 2018-02-28 | Disposition: A | Discharge status: Home / Self Care | Payer: Medicare Other | Source: Ambulatory Visit | Attending: Cardiology | Admitting: Cardiology

## 2018-03-02 ENCOUNTER — Encounter (HOSPITAL_COMMUNITY)
Admission: RE | Admit: 2018-03-02 | Discharge: 2018-03-02 | Disposition: A | Discharge status: Home / Self Care | Payer: Medicare Other | Source: Ambulatory Visit | Attending: Cardiology | Admitting: Cardiology

## 2018-03-05 ENCOUNTER — Ambulatory Visit (INDEPENDENT_AMBULATORY_CARE_PROVIDER_SITE_OTHER): Payer: Medicare Other | Admitting: *Deleted

## 2018-03-05 ENCOUNTER — Ambulatory Visit: Payer: Medicare Other | Admitting: Internal Medicine

## 2018-03-05 ENCOUNTER — Encounter (HOSPITAL_COMMUNITY)
Admission: RE | Admit: 2018-03-05 | Discharge: 2018-03-05 | Disposition: A | Discharge status: Home / Self Care | Payer: Medicare Other | Source: Ambulatory Visit | Attending: Cardiology | Admitting: Cardiology

## 2018-03-05 ENCOUNTER — Encounter: Payer: Self-pay | Admitting: Internal Medicine

## 2018-03-05 VITALS — BP 124/64 | Ht 66.0 in | Wt 125.0 lb

## 2018-03-05 DIAGNOSIS — Z7901 Long term (current) use of anticoagulants: Secondary | ICD-10-CM

## 2018-03-05 DIAGNOSIS — Z953 Presence of xenogenic heart valve: Secondary | ICD-10-CM | POA: Diagnosis not present

## 2018-03-05 DIAGNOSIS — I442 Atrioventricular block, complete: Secondary | ICD-10-CM

## 2018-03-05 LAB — POCT INR: INR: 1.8

## 2018-03-05 NOTE — Progress Notes (Signed)
Electrophysiology Office Note Date: 03/05/2018  ID:  Patricia [name], DOB 01-31-1951, MRN [id number]  PCP: Susy Frizzle, MD Primary Cardiologist: Hochrein Electrophysiologist: Rilea Arutyunyan  CC: Pacemaker/AF follow-up  Patricia [name] is a 67 y.o. female seen today for Dr Rayann Heman.  She presents today for routine electrophysiology followup.  Since last being seen in our clinic, the patient reports doing reasonably well.  She has persistent shortness of breath with exertion, but improving with participation in rehab.   She denies chest pain, palpitations, dyspnea, PND, orthopnea, nausea, vomiting, dizziness, syncope, edema, weight gain, or early satiety.  Device History: MDT dual chamber PPM implanted 2018 for complete heart block    Past Medical History:  Diagnosis Date  . Atrial flutter (HCC)    Ablated 1998 Dr. Caryl Comes  . Bleeding gastric ulcer    back in 2000  . Breast cancer ([place])   . COPD (chronic obstructive pulmonary disease) (HCC)    from radiation  . GERD (gastroesophageal reflux disease)   . Hodgkin's lymphoma ([place])    Treated with radiation and chemo  . Mitral regurgitation    pvc  . Osteoporosis    lumbar verterbral fracture, left ankle fracture, dexa 2015  . Pancreatitis   . Pneumonia   . Prediabetes   . 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 Ulcer    Past Surgical History:  Procedure Laterality Date  . BREAST SURGERY     breast cancer since 2005   left mastectomy  . CARDIAC ELECTROPHYSIOLOGY [place] AND ABLATION  1999  . CATARACT EXTRACTION, BILATERAL    . COLONOSCOPY    . IR RADIOLOGY PERIPHERAL GUIDED IV START  08/03/2017  . IR US GUIDE VASC ACCESS RIGHT  08/03/2017  . LAPAROTOMY    . MASTECTOMY  2005   Left-axillary  . MITRAL VALVE REPAIR Right 09/13/2017   Procedure: MINIMALLY INVASIVE MITRAL VALVE REPLACEMENT (MVR) with Medtronic  Mosaic Porcine Heart Valve size 59mm;  Surgeon: Rexene Alberts, MD;  Location: [facility];  Service: Open Heart Surgery;  Laterality: Right;  . ORIF ANKLE FRACTURE Left 03/21/2014   Procedure: LEFT ANKLE FRACTURE OPEN TREATMENT BILMALLEOLAR INCLUDES INTERNAL FIXATION ;  Surgeon: Renette Butters, MD;  Location: [facility];  Service: Orthopedics;  Laterality: Left;  . PACEMAKER IMPLANT N/A 09/19/2017   Medtronic Azure XT MRI conditional dual-chamber pacemaker for symptomatic complete heart block  by Dr Rayann Heman  . RIGHT/LEFT HEART CATH AND CORONARY ANGIOGRAPHY N/A 06/27/2017   Procedure: Right/Left Heart Cath and Coronary Angiography;  Surgeon: Larey [name], MD;  Location: [facility];  Service: Cardiovascular;  Laterality: N/A;  . 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 TEE WITHOUT CARDIOVERSION  06/12/2012   Procedure: TRANSESOPHAGEAL ECHOCARDIOGRAM (TEE);  Surgeon: Larey [name], MD;  Location: [facility];  Service: Cardiovascular;  Laterality: N/A;  . TEE WITHOUT CARDIOVERSION N/A 06/29/2017   Procedure: TRANSESOPHAGEAL ECHOCARDIOGRAM (TEE);  Surgeon: Jerline Pain, MD;  Location: [facility];  Service: Cardiovascular;  Laterality: N/A;  . TEE WITHOUT CARDIOVERSION N/A 09/13/2017   Procedure: TRANSESOPHAGEAL ECHOCARDIOGRAM (TEE);  Surgeon: Rexene Alberts, MD;  Location: [facility];  Service: Open Heart Surgery;  Laterality: N/A;  . TONSILLECTOMY AND ADENOIDECTOMY      Current Outpatient Medications  Medication Sig Dispense Refill  . acetaminophen (TYLENOL) 325 MG tablet Take 2 tablets (650 mg total) by mouth  2 (two) times daily as needed for moderate pain or headache.    Marland Kitchen aspirin EC 81 MG tablet Take 81 mg by mouth 3 (three) times a week.    . Calcium Carbonate-Vitamin D (CALCIUM PLUS VITAMIN D PO) Take 2 tablets by mouth daily.    . cetirizine (ZYRTEC) 10 MG tablet Take 10 mg by mouth daily as needed for allergies.    Marland Kitchen esomeprazole (NEXIUM) 40 MG capsule Take 40 mg by  mouth daily before breakfast.   12  . furosemide (LASIX) 40 MG tablet Take 40 mg by mouth daily.    Marland Kitchen latanoprost (XALATAN) 0.005 % ophthalmic solution Place 1 drop into both eyes at bedtime.     . lipase/protease/amylase (CREON) 12000 UNITS CPEP capsule Take 1 capsule (12,000 Units total) by mouth 3 (three) times daily with meals. (Patient taking differently: Take 12,000 Units by mouth 3 (three) times daily before meals. ) 270 capsule 1  . metoprolol tartrate (LOPRESSOR) 100 MG tablet Take 1 tablet (100 mg total) by mouth 2 (two) times daily. 180 tablet 3  . Multiple Vitamin (MULTIVITAMIN WITH MINERALS) TABS tablet Take 1 tablet by mouth daily. Centrum Silver.    Glory Rosebush DELICA LANCETS 67E MISC TEST SUGAR ONCE A WEEK 30 each 4  . ONETOUCH VERIO test strip CHECK SUGAR ONCE A WEEK 100 each 12  . potassium chloride SA (K-DUR,KLOR-CON) 20 MEQ tablet Take 20 mEq by mouth daily.    Marland Kitchen warfarin (COUMADIN) 5 MG tablet Take 1.5 to 2 tablets by mouth daily or as directed by coumadin clinic 60 tablet 5   No current facility-administered medications for this visit.     Allergies:   Azithromycin; Penicillins; Sulfa antibiotics; and Cheese   Social History: Social History   Socioeconomic History  . Marital status: Widowed    Spouse name: Not on file  . Number of children: Not on file  . Years of education: Not on file  . Highest education level: Master's degree (e.g., MA, MS, MEng, MEd, MSW, MBA)  Occupational History  . Occupation: Pharmacist, hospital (retired)  Social Needs  . Financial resource strain: Not on file  . Food insecurity:    Worry: Not on file    Inability: Not on file  . Transportation needs:    Medical: Not on file    Non-medical: Not on file  Tobacco Use  . Smoking status: Never Smoker  . Smokeless tobacco: Never Used  Substance and Sexual Activity  . Alcohol use: No  . Drug use: No  . Sexual activity: Yes  Lifestyle  . Physical activity:    Days per week: 3 days    Minutes  per session: 30 min  . Stress: Not at all  Relationships  . Social connections:    Talks on phone: Not on file    Gets together: Not on file    Attends religious service: Not on file    Active member of club or organization: Not on file    Attends meetings of clubs or organizations: Not on file    Relationship status: Not on file  . Intimate partner violence:    Fear of current or ex partner: Not on file    Emotionally abused: Not on file    Physically abused: Not on file    Forced sexual activity: Not on file  Other Topics Concern  . Not on file  Social History Narrative   Husband passed away from Cancer in 09/08/17.    Family  History: Family History  Problem Relation Age of Onset  . Rheumatic fever Father        Died suddenly age 27  . Diabetes Father   . Cancer Father        colon  . Heart disease Father        rheumatic fever x 3  . Arthritis Maternal Grandmother   . Hypertension Maternal Grandmother   . Diabetes Paternal Grandmother   . Vision loss Paternal Grandmother        glaucoma     Review of Systems: All other systems reviewed and are otherwise negative except as noted above.   Physical Exam: VS:  BP 124/64   Ht 5\' 6"  (1.676 m)   Wt 125 lb (56.7 kg)   BMI 20.18 kg/m  , BMI Body mass index is 20.18 kg/m.  GEN- The patient is well appearing, alert and oriented x 3 today.   HEENT: normocephalic, atraumatic; sclera clear, conjunctiva pink; hearing intact; oropharynx clear; neck supple  Lungs- Clear to ausculation bilaterally, normal work of breathing.  No wheezes, rales, rhonchi Heart- Regular rate and rhythm (paced) GI- soft, non-tender, non-distended, bowel sounds present  Extremities- no clubbing, cyanosis, or edema  MS- no significant deformity or atrophy Skin- warm and dry, no rash or lesion; PPM pocket well healed Psych- euthymic mood, full affect Neuro- strength and sensation are intact  PPM Interrogation- reviewed in detail today,  See  PACEART report  EKG:  EKG is not ordered today.  Recent Labs: 09/11/2017: ALT 22 09/14/2017: Magnesium 2.4 09/27/2017: Hemoglobin 9.5; Platelets 590 10/27/2017: BUN 28; Creatinine, Ser 1.18; Potassium 4.5; Sodium 138 01/15/2018: TSH 12.930   Wt Readings from Last 3 Encounters:  03/05/18 125 lb (56.7 kg)  02/09/18 128 lb 8.5 oz (58.3 kg)  01/23/18 130 lb (59 kg)     Other studies Reviewed: Additional studies/ records that were reviewed today include:  Dr Hochrein's office notes  Assessment and Plan:  1.  Complete heart block  Normal PPM function See Pace Art report No changes today  2.  Paroxysmal atrial fibrillation No recurrence since last office visit Stop Warfarin today  3.  S/p MVR Followed by Dr Percival Spanish   Current medicines are reviewed at length with the patient today.   The patient does not have concerns regarding her medicines.  The following changes were made today:  Stop Warfarin  Labs/ tests ordered today include: none No orders of the defined types were placed in this encounter.    Disposition:   Follow up with [facility], Dr Rayann Heman 1 year     Signed, Thompson Grayer, MD 03/05/2018 2:48 PM  [place] [address] [place]  [place] [address] [phone] (office) [phone] (fax)

## 2018-03-05 NOTE — Patient Instructions (Signed)
Description   Take 2.5 tablets today, then increase dose to 2 tablets daily except 1.5 tablets each Sundays.   Repeat INR in 2 weeks.

## 2018-03-05 NOTE — Addendum Note (Signed)
Encounter addended by: Sol Passer on: 03/05/2018 8:02 AM  Actions taken: Flowsheet data copied forward, Flowsheet accepted

## 2018-03-07 ENCOUNTER — Encounter (HOSPITAL_COMMUNITY): Payer: Medicare Other

## 2018-03-08 ENCOUNTER — Encounter: Payer: Medicare Other | Admitting: Internal Medicine

## 2018-03-09 ENCOUNTER — Encounter (HOSPITAL_COMMUNITY)
Admission: RE | Admit: 2018-03-09 | Discharge: 2018-03-09 | Disposition: A | Discharge status: Home / Self Care | Payer: Medicare Other | Source: Ambulatory Visit | Attending: Cardiology | Admitting: Cardiology

## 2018-03-19 LAB — CUP PACEART INCLINIC DEVICE CHECK
Battery Remaining Longevity: 142 mo
Battery Voltage: 3.11 V
Brady Statistic AP VP Percent: 0 %
Brady Statistic AP VS Percent: 0 %
Brady Statistic AS VP Percent: 99.99 %
Brady Statistic AS VS Percent: 0 %
Brady Statistic RA Percent Paced: 0 %
Brady Statistic RV Percent Paced: 100 %
Date Time Interrogation Session: 20190325182656
Implantable Lead Implant Date: 20181009
Implantable Lead Implant Date: 20181009
Implantable Lead Location: 753859
Implantable Lead Location: 753860
Implantable Lead Model: 5076
Implantable Lead Model: 5076
Implantable Pulse Generator Implant Date: 20181009
Lead Channel Impedance Value: 228 Ohm
Lead Channel Impedance Value: 285 Ohm
Lead Channel Impedance Value: 342 Ohm
Lead Channel Impedance Value: 475 Ohm
Lead Channel Pacing Threshold Amplitude: 0.75 V
Lead Channel Pacing Threshold Amplitude: 1 V
Lead Channel Pacing Threshold Pulse Width: 0.4 ms
Lead Channel Pacing Threshold Pulse Width: 0.4 ms
Lead Channel Sensing Intrinsic Amplitude: 1.875 mV
Lead Channel Sensing Intrinsic Amplitude: 2.5 mV
Lead Channel Sensing Intrinsic Amplitude: 3.625 mV
Lead Channel Setting Pacing Amplitude: 1.5 V
Lead Channel Setting Pacing Amplitude: 2 V
Lead Channel Setting Pacing Pulse Width: 0.4 ms
Lead Channel Setting Sensing Sensitivity: 2 mV

## 2018-03-20 ENCOUNTER — Encounter: Payer: Self-pay | Admitting: Family Medicine

## 2018-03-20 ENCOUNTER — Ambulatory Visit: Payer: Medicare Other | Admitting: Family Medicine

## 2018-03-20 VITALS — BP 132/68 | HR 98 | Temp 97.6°F | Resp 12 | Ht 66.0 in | Wt 128.0 lb

## 2018-03-20 DIAGNOSIS — E038 Other specified hypothyroidism: Secondary | ICD-10-CM

## 2018-03-20 DIAGNOSIS — Z1159 Encounter for screening for other viral diseases: Secondary | ICD-10-CM | POA: Diagnosis not present

## 2018-03-20 DIAGNOSIS — E039 Hypothyroidism, unspecified: Secondary | ICD-10-CM

## 2018-03-20 NOTE — Progress Notes (Signed)
Subjective:    Patient ID: Patricia [name], female    DOB: Sep 19, 1951, 67 y.o.   MRN: [id number]  HPI  01/23/18 Patient recently underwent minimally invasive mitral valve repair.  Was placed on amiodarone postoperatively and has been using amiodarone for the last 4 months.  Amiodarone was discontinued 2 weeks ago.  At her recent outpatient follow-up with her cardiologist, TSH was checked and found to be greater than 12.  Patient is here today for follow-up.  She denies any recent weight change.  She denies any tachycardia.  She does report cold intolerance.  She does report fatigue.  She does report thinning hair.  She denies constipation.  She denies tremor or tachycardia.  She denies anxiety.  She denies any depression although she is still grieving the death of her husband from pancreatic cancer.  At that time, my plan was: Patient has an elevated TSH.  Perhaps this is due to recent use of amiodarone.  I will check free T3 and free T4 levels.  If T3 and T4 levels are low, the patient will benefit from thyroid replacement.  If T3 and T4 however are normal, the patient has subclinical hypothyroidism and I would recommend rechecking a TSH with hormone levels again in 3 months prior to initiating medication.  Patient is comfortable with this plan  03/20/18 Patient is here today to recheck her thyroid.  She is due for hepatitis C screening based on her birthday.  She would like to proceed with this given her past medical history and frequent hospitalizations particularly in the 23s.  She denies any fatigue.  Her exam is significant for resting heart rate in the upper 90s.  This is completely asymptomatic.  She denies any chest pain shortness of breath or dyspnea on exertion.  She is on Lopressor 100 mg twice daily.  There is no evidence of DVT or PE.  She denies any recent bleeding.  She denies any cause for dehydration. Past Medical History:  Diagnosis Date  . Atrial flutter (HCC)    Ablated 1998 Dr.  Caryl Comes  . Bleeding gastric ulcer    back in 2000  . Breast cancer ([place])   . COPD (chronic obstructive pulmonary disease) (HCC)    from radiation  . GERD (gastroesophageal reflux disease)   . Hodgkin's lymphoma ([place])    Treated with radiation and chemo  . Mitral regurgitation    pvc  . Osteoporosis    lumbar verterbral fracture, left ankle fracture, dexa 2015  . Pancreatitis   . Pneumonia   . Prediabetes   . 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 Ulcer    Past Surgical History:  Procedure Laterality Date  . BREAST SURGERY     breast cancer since 2005   left mastectomy  . CARDIAC ELECTROPHYSIOLOGY [place] AND ABLATION  1999  . CATARACT EXTRACTION, BILATERAL    . COLONOSCOPY    . IR RADIOLOGY PERIPHERAL GUIDED IV START  08/03/2017  . IR US GUIDE VASC ACCESS RIGHT  08/03/2017  . LAPAROTOMY    . MASTECTOMY  2005   Left-axillary  . MITRAL VALVE REPAIR Right 09/13/2017   Procedure: MINIMALLY INVASIVE MITRAL VALVE REPLACEMENT (MVR) with Medtronic Mosaic Porcine Heart Valve size 30mm;  Surgeon: Rexene Alberts, MD;  Location: [facility];  Service: Open Heart Surgery;  Laterality: Right;  . ORIF ANKLE FRACTURE Left 03/21/2014   Procedure: LEFT  ANKLE FRACTURE OPEN TREATMENT BILMALLEOLAR INCLUDES INTERNAL FIXATION ;  Surgeon: Renette Butters, MD;  Location: [facility];  Service: Orthopedics;  Laterality: Left;  . PACEMAKER IMPLANT N/A 09/19/2017   Medtronic Azure XT MRI conditional dual-chamber pacemaker for symptomatic complete heart block  by Dr Rayann Heman  . RIGHT/LEFT HEART CATH AND CORONARY ANGIOGRAPHY N/A 06/27/2017   Procedure: Right/Left Heart Cath and Coronary Angiography;  Surgeon: Larey [name], MD;  Location: [facility];  Service: Cardiovascular;  Laterality: N/A;  . 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 TEE WITHOUT CARDIOVERSION  06/12/2012    Procedure: TRANSESOPHAGEAL ECHOCARDIOGRAM (TEE);  Surgeon: Larey [name], MD;  Location: [facility];  Service: Cardiovascular;  Laterality: N/A;  . TEE WITHOUT CARDIOVERSION N/A 06/29/2017   Procedure: TRANSESOPHAGEAL ECHOCARDIOGRAM (TEE);  Surgeon: Jerline Pain, MD;  Location: [facility];  Service: Cardiovascular;  Laterality: N/A;  . TEE WITHOUT CARDIOVERSION N/A 09/13/2017   Procedure: TRANSESOPHAGEAL ECHOCARDIOGRAM (TEE);  Surgeon: Rexene Alberts, MD;  Location: [facility];  Service: Open Heart Surgery;  Laterality: N/A;  . TONSILLECTOMY AND ADENOIDECTOMY     Current Outpatient Medications on File Prior to Visit  Medication Sig Dispense Refill  . acetaminophen (TYLENOL) 325 MG tablet Take 2 tablets (650 mg total) by mouth 2 (two) times daily as needed for moderate pain or headache.    Marland Kitchen aspirin EC 81 MG tablet Take 81 mg by mouth 3 (three) times a week.    . Calcium Carbonate-Vitamin D (CALCIUM PLUS VITAMIN D PO) Take 2 tablets by mouth daily.    . cetirizine (ZYRTEC) 10 MG tablet Take 10 mg by mouth daily as needed for allergies.    Marland Kitchen esomeprazole (NEXIUM) 40 MG capsule Take 40 mg by mouth daily before breakfast.   12  . furosemide (LASIX) 40 MG tablet Take 40 mg by mouth daily.    Marland Kitchen latanoprost (XALATAN) 0.005 % ophthalmic solution Place 1 drop into both eyes at bedtime.     . lipase/protease/amylase (CREON) 12000 units CPEP capsule Take 12,000 Units by mouth 3 (three) times daily before meals.    . metoprolol tartrate (LOPRESSOR) 100 MG tablet Take 1 tablet (100 mg total) by mouth 2 (two) times daily. 180 tablet 3  . Multiple Vitamin (MULTIVITAMIN WITH MINERALS) TABS tablet Take 1 tablet by mouth daily. Centrum Silver.    Glory Rosebush DELICA LANCETS 40J MISC TEST SUGAR ONCE A WEEK 30 each 4  . ONETOUCH VERIO test strip CHECK SUGAR ONCE A WEEK 100 each 12  . potassium chloride SA (K-DUR,KLOR-CON) 20 MEQ tablet Take 20 mEq by mouth daily.     No current facility-administered medications  on file prior to visit.    Allergies  Allergen Reactions  . Azithromycin Other (See Comments)    pancreatitis  . Penicillins Anaphylaxis, Hives, Swelling and Other (See Comments)    PATIENT HAS HAD A PCN REACTION WITH IMMEDIATE RASH, FACIAL/TONGUE/THROAT SWELLING, SOB, OR LIGHTHEADEDNESS WITH HYPOTENSION:  #  #  #  YES  #  #  #   HAS PT DEVELOPED SEVERE RASH INVOLVING MUCUS MEMBRANES or SKIN NECROSIS: #  #  #  YES  #  #  #  Has patient had a PCN reaction that required hospitalization: No Has patient had a PCN reaction occurring within the last 10 years: No   . Sulfa Antibiotics Hives and Rash  . Cheese Nausea And Vomiting   Social History   Socioeconomic History  .  Marital status: Widowed    Spouse name: Not on file  . Number of children: Not on file  . Years of education: Not on file  . Highest education level: Master's degree (e.g., MA, MS, MEng, MEd, MSW, MBA)  Occupational History  . Occupation: Pharmacist, hospital (retired)  Social Needs  . Financial resource strain: Not on file  . Food insecurity:    Worry: Not on file    Inability: Not on file  . Transportation needs:    Medical: Not on file    Non-medical: Not on file  Tobacco Use  . Smoking status: Never Smoker  . Smokeless tobacco: Never Used  Substance and Sexual Activity  . Alcohol use: No  . Drug use: No  . Sexual activity: Yes  Lifestyle  . Physical activity:    Days per week: 3 days    Minutes per session: 30 min  . Stress: Not at all  Relationships  . Social connections:    Talks on phone: Not on file    Gets together: Not on file    Attends religious service: Not on file    Active member of club or organization: Not on file    Attends meetings of clubs or organizations: Not on file    Relationship status: Not on file  . Intimate partner violence:    Fear of current or ex partner: Not on file    Emotionally abused: Not on file    Physically abused: Not on file    Forced sexual activity: Not on file  Other  Topics Concern  . Not on file  Social History Narrative   Husband passed away from Cancer in 09/14/2017.     Review of Systems  All other systems reviewed and are negative.      Objective:   Physical Exam  Constitutional: She appears well-developed and well-nourished.  Neck: Neck supple. No thyromegaly present.  Cardiovascular: Normal rate, regular rhythm and normal heart sounds.  Pulmonary/Chest: Effort normal and breath sounds normal. No respiratory distress. She has no wheezes. She has no rales.  Vitals reviewed.         Assessment & Plan:  Encounter for hepatitis C screening test for low risk patient - Plan: Hepatitis C Antibody  Subclinical hypothyroidism - Plan: CBC with Differential/Platelet, COMPLETE METABOLIC PANEL WITH GFR, TSH, T4, free  I will screen the patient for hepatitis C as we discussed.  She politely declines her tetanus shot today.  She can receive that anytime she would like.  I will check a TSH and a free T4.  Given her tachycardia, I would also check a CBC and a CMP to evaluate for anemia or dehydration.  I have asked the patient to monitor her heart rate.  If consistently greater than 100, I would consider adding diltiazem in addition to the metoprolol.

## 2018-03-21 LAB — CBC WITH DIFFERENTIAL/PLATELET
Basophils Absolute: 57 cells/uL (ref 0–200)
Basophils Relative: 0.8 %
Eosinophils Absolute: 178 cells/uL (ref 15–500)
Eosinophils Relative: 2.5 %
HCT: 39.1 % (ref 35.0–45.0)
Hemoglobin: 12.9 g/dL (ref 11.7–15.5)
Lymphs Abs: 1385 cells/uL (ref 850–3900)
MCH: 28.3 pg (ref 27.0–33.0)
MCHC: 33 g/dL (ref 32.0–36.0)
MCV: 85.7 fL (ref 80.0–100.0)
MPV: 8.9 fL (ref 7.5–12.5)
Monocytes Relative: 13.6 %
Neutro Abs: 4516 cells/uL (ref 1500–7800)
Neutrophils Relative %: 63.6 %
Platelets: 467 10*3/uL — ABNORMAL HIGH (ref 140–400)
RBC: 4.56 10*6/uL (ref 3.80–5.10)
RDW: 16.5 % — ABNORMAL HIGH (ref 11.0–15.0)
Total Lymphocyte: 19.5 %
WBC mixed population: 966 cells/uL — ABNORMAL HIGH (ref 200–950)
WBC: 7.1 10*3/uL (ref 3.8–10.8)

## 2018-03-21 LAB — COMPLETE METABOLIC PANEL WITH GFR
AG Ratio: 2 (calc) (ref 1.0–2.5)
ALT: 20 U/L (ref 6–29)
AST: 25 U/L (ref 10–35)
Albumin: 4.5 g/dL (ref 3.6–5.1)
Alkaline phosphatase (APISO): 92 U/L (ref 33–130)
BUN/Creatinine Ratio: 18 (calc) (ref 6–22)
BUN: 32 mg/dL — ABNORMAL HIGH (ref 7–25)
CO2: 29 mmol/L (ref 20–32)
Calcium: 10.3 mg/dL (ref 8.6–10.4)
Chloride: 98 mmol/L (ref 98–110)
Creat: 1.77 mg/dL — ABNORMAL HIGH (ref 0.50–0.99)
GFR, Est African American: 34 mL/min/{1.73_m2} — ABNORMAL LOW (ref >=60)
GFR, Est Non African American: 29 mL/min/{1.73_m2} — ABNORMAL LOW (ref >=60)
Globulin: 2.2 g/dL (calc) (ref 1.9–3.7)
Glucose, Bld: 117 mg/dL — ABNORMAL HIGH (ref 65–99)
Potassium: 4.6 mmol/L (ref 3.5–5.3)
Sodium: 139 mmol/L (ref 135–146)
Total Bilirubin: 0.6 mg/dL (ref 0.2–1.2)
Total Protein: 6.7 g/dL (ref 6.1–8.1)

## 2018-03-21 LAB — T4, FREE: Free T4: 1.1 ng/dL (ref 0.8–1.8)

## 2018-03-21 LAB — HEPATITIS C ANTIBODY
Hepatitis C Ab: NONREACTIVE
SIGNAL TO CUT-OFF: 0.02 (ref <1.00)

## 2018-03-21 LAB — TSH: TSH: 15.1 m[IU]/L — ABNORMAL HIGH (ref 0.40–4.50)

## 2018-03-22 ENCOUNTER — Other Ambulatory Visit: Payer: Self-pay | Admitting: Cardiology

## 2018-03-22 MED ORDER — FUROSEMIDE 40 MG PO TABS: 40.0000 mg | ORAL_TABLET | Freq: Every day | ORAL | 2 refills | Status: DC

## 2018-03-23 ENCOUNTER — Other Ambulatory Visit: Payer: Self-pay | Admitting: Family Medicine

## 2018-03-23 ENCOUNTER — Ambulatory Visit: Payer: Medicare Other | Admitting: Family Medicine

## 2018-03-23 ENCOUNTER — Encounter (INDEPENDENT_AMBULATORY_CARE_PROVIDER_SITE_OTHER): Payer: Self-pay

## 2018-03-23 MED ORDER — LEVOTHYROXINE SODIUM 25 MCG PO TABS: 25.0000 ug | ORAL_TABLET | Freq: Every day | ORAL | 1 refills | Status: DC

## 2018-03-27 ENCOUNTER — Ambulatory Visit (INDEPENDENT_AMBULATORY_CARE_PROVIDER_SITE_OTHER): Payer: Medicare Other | Admitting: *Deleted

## 2018-03-27 DIAGNOSIS — I442 Atrioventricular block, complete: Secondary | ICD-10-CM

## 2018-03-27 NOTE — Progress Notes (Signed)
Remote pacemaker transmission.   

## 2018-03-28 LAB — CUP PACEART REMOTE DEVICE CHECK
Battery Remaining Longevity: 143 mo
Battery Voltage: 3.1 V
Brady Statistic AP VP Percent: 0 %
Brady Statistic AP VS Percent: 0 %
Brady Statistic AS VP Percent: 99.99 %
Brady Statistic AS VS Percent: 0 %
Brady Statistic RA Percent Paced: 0 %
Brady Statistic RV Percent Paced: 100 %
Date Time Interrogation Session: 20190416004843
Implantable Lead Implant Date: 20181009
Implantable Lead Implant Date: 20181009
Implantable Lead Location: 753859
Implantable Lead Location: 753860
Implantable Lead Model: 5076
Implantable Lead Model: 5076
Implantable Pulse Generator Implant Date: 20181009
Lead Channel Impedance Value: 247 Ohm
Lead Channel Impedance Value: 304 Ohm
Lead Channel Impedance Value: 380 Ohm
Lead Channel Impedance Value: 494 Ohm
Lead Channel Pacing Threshold Amplitude: 0.875 V
Lead Channel Pacing Threshold Amplitude: 1 V
Lead Channel Pacing Threshold Pulse Width: 0.4 ms
Lead Channel Pacing Threshold Pulse Width: 0.4 ms
Lead Channel Sensing Intrinsic Amplitude: 2.5 mV
Lead Channel Sensing Intrinsic Amplitude: 2.5 mV
Lead Channel Sensing Intrinsic Amplitude: 3.625 mV
Lead Channel Setting Pacing Amplitude: 1.75 V
Lead Channel Setting Pacing Amplitude: 2 V
Lead Channel Setting Pacing Pulse Width: 0.4 ms
Lead Channel Setting Sensing Sensitivity: 2 mV

## 2018-03-29 ENCOUNTER — Encounter: Payer: Self-pay | Admitting: Cardiology

## 2018-05-02 LAB — HM MAMMOGRAPHY

## 2018-05-04 ENCOUNTER — Encounter: Payer: Self-pay | Admitting: *Deleted

## 2018-05-21 ENCOUNTER — Encounter: Payer: Self-pay | Admitting: Family Medicine

## 2018-05-21 ENCOUNTER — Ambulatory Visit (INDEPENDENT_AMBULATORY_CARE_PROVIDER_SITE_OTHER): Payer: Medicare Other | Admitting: Family Medicine

## 2018-05-21 VITALS — BP 144/68 | HR 88 | Temp 98.0°F | Resp 12 | Ht 66.0 in | Wt 127.0 lb

## 2018-05-21 DIAGNOSIS — N183 Chronic kidney disease, stage 3 unspecified: Secondary | ICD-10-CM

## 2018-05-21 DIAGNOSIS — E039 Hypothyroidism, unspecified: Secondary | ICD-10-CM | POA: Diagnosis not present

## 2018-05-21 DIAGNOSIS — R7303 Prediabetes: Secondary | ICD-10-CM

## 2018-05-21 LAB — BASIC METABOLIC PANEL WITH GFR
BUN/Creatinine Ratio: 23 (calc) — ABNORMAL HIGH (ref 6–22)
BUN: 31 mg/dL — ABNORMAL HIGH (ref 7–25)
CO2: 31 mmol/L (ref 20–32)
Calcium: 10.3 mg/dL (ref 8.6–10.4)
Chloride: 94 mmol/L — ABNORMAL LOW (ref 98–110)
Creat: 1.34 mg/dL — ABNORMAL HIGH (ref 0.50–0.99)
GFR, Est African American: 47 mL/min/{1.73_m2} — ABNORMAL LOW (ref >=60)
GFR, Est Non African American: 41 mL/min/{1.73_m2} — ABNORMAL LOW (ref >=60)
Glucose, Bld: 110 mg/dL — ABNORMAL HIGH (ref 65–99)
Potassium: 4.5 mmol/L (ref 3.5–5.3)
Sodium: 136 mmol/L (ref 135–146)

## 2018-05-21 LAB — TSH: TSH: 16.22 mIU/L — ABNORMAL HIGH (ref 0.40–4.50)

## 2018-05-21 NOTE — Progress Notes (Signed)
Subjective:    Patient ID: Patricia [name], female    DOB: 1951/08/08, 67 y.o.   MRN: [id number]  HPI  01/23/18 Patient recently underwent minimally invasive mitral valve repair.  Was placed on amiodarone postoperatively and has been using amiodarone for the last 4 months.  Amiodarone was discontinued 2 weeks ago.  At her recent outpatient follow-up with her cardiologist, TSH was checked and found to be greater than 12.  Patient is here today for follow-up.  She denies any recent weight change.  She denies any tachycardia.  She does report cold intolerance.  She does report fatigue.  She does report thinning hair.  She denies constipation.  She denies tremor or tachycardia.  She denies anxiety.  She denies any depression although she is still grieving the death of her husband from pancreatic cancer.  At that time, my plan was: Patient has an elevated TSH.  Perhaps this is due to recent use of amiodarone.  I will check free T3 and free T4 levels.  If T3 and T4 levels are low, the patient will benefit from thyroid replacement.  If T3 and T4 however are normal, the patient has subclinical hypothyroidism and I would recommend rechecking a TSH with hormone levels again in 3 months prior to initiating medication.  Patient is comfortable with this plan  03/20/18 Patient is here today to recheck her thyroid.  She is due for hepatitis C screening based on her birthday.  She would like to proceed with this given her past medical history and frequent hospitalizations particularly in the 57s.  She denies any fatigue.  Her exam is significant for resting heart rate in the upper 90s.  This is completely asymptomatic.  She denies any chest pain shortness of breath or dyspnea on exertion.  She is on Lopressor 100 mg twice daily.  There is no evidence of DVT or PE.  She denies any recent bleeding.  She denies any cause for dehydration.  At that time, my plan was: I will screen the patient for hepatitis C as we discussed.   She politely declines her tetanus shot today.  She can receive that anytime she would like.  I will check a TSH and a free T4.  Given her tachycardia, I would also check a CBC and a CMP to evaluate for anemia or dehydration.  I have asked the patient to monitor her heart rate.  If consistently greater than 100, I would consider adding diltiazem in addition to the metoprolol.  05/21/18 At that visit, TSH was 15 and T4 was borderline low at 1.1.  Given her cardiac history, decision was made to start Levothyroxine at 25 mcg poqday and patient is here to recheck TSH.  Also at that visit, patient's creatinine had risen from 1.18-1.77.  She has a history of stage III chronic kidney disease however this was the worse her renal function had been.  She is here today to monitor that as well.  She is taking Lasix.  She states that she does not take the medication, she developed shortness of breath and orthopnea.  On the medication she feels fine.  She denies any leg swelling.  She denies any chest pain.  She denies any hair loss, constipation, cold intolerance.  She denies any palpitations.  Patient has a history of prediabetes and her last fasting blood sugar was 117.  She is checking it regularly and her sugars seem to stay below 120 fasting in the morning.  Past Medical  History:  Diagnosis Date  . Atrial flutter (HCC)    Ablated 1998 Dr. Caryl Comes  . Bleeding gastric ulcer    back in 2000  . Breast cancer ([place])   . COPD (chronic obstructive pulmonary disease) (HCC)    from radiation  . GERD (gastroesophageal reflux disease)   . Hodgkin's lymphoma ([place])    Treated with radiation and chemo  . Mitral regurgitation    pvc  . Osteoporosis    lumbar verterbral fracture, left ankle fracture, dexa 2015  . Pancreatitis   . Pneumonia   . Prediabetes   . 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 Ulcer     Past Surgical History:  Procedure Laterality Date  . BREAST SURGERY     breast cancer since 2005   left mastectomy  . CARDIAC ELECTROPHYSIOLOGY [place] AND ABLATION  1999  . CATARACT EXTRACTION, BILATERAL    . COLONOSCOPY    . IR RADIOLOGY PERIPHERAL GUIDED IV START  08/03/2017  . IR US GUIDE VASC ACCESS RIGHT  08/03/2017  . LAPAROTOMY    . MASTECTOMY  2005   Left-axillary  . MITRAL VALVE REPAIR Right 09/13/2017   Procedure: MINIMALLY INVASIVE MITRAL VALVE REPLACEMENT (MVR) with Medtronic Mosaic Porcine Heart Valve size 26mm;  Surgeon: Rexene Alberts, MD;  Location: [place];  Service: Open Heart Surgery;  Laterality: Right;  . ORIF ANKLE FRACTURE Left 03/21/2014   Procedure: LEFT ANKLE FRACTURE OPEN TREATMENT BILMALLEOLAR INCLUDES INTERNAL FIXATION ;  Surgeon: Renette Butters, MD;  Location: [place];  Service: Orthopedics;  Laterality: Left;  . PACEMAKER IMPLANT N/A 09/19/2017   Medtronic Azure XT MRI conditional dual-chamber pacemaker for symptomatic complete heart block  by Dr Rayann Heman  . RIGHT/LEFT HEART CATH AND CORONARY ANGIOGRAPHY N/A 06/27/2017   Procedure: Right/Left Heart Cath and Coronary Angiography;  Surgeon: Larey [name], MD;  Location: [place] [facility];  Service: Cardiovascular;  Laterality: N/A;  . 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 TEE WITHOUT CARDIOVERSION  06/12/2012   Procedure: TRANSESOPHAGEAL ECHOCARDIOGRAM (TEE);  Surgeon: Larey [name], MD;  Location: [place];  Service: Cardiovascular;  Laterality: N/A;  . TEE WITHOUT CARDIOVERSION N/A 06/29/2017   Procedure: TRANSESOPHAGEAL ECHOCARDIOGRAM (TEE);  Surgeon: Jerline Pain, MD;  [facility] END[facility];  Service: Cardiovascular;  Laterality: N/A;  . TEE WITHOUT CARDIOVERSION N/A 09/13/2017   Procedure: TRANSESOPHAGEAL ECHOCARDIOGRAM (TEE);  Surgeon: Rexene Alberts, MD;  Location: [place];  Service: Open Heart Surgery;  Laterality: N/A;  . TONSILLECTOMY AND ADENOIDECTOMY     Current  Outpatient Medications on File Prior to Visit  Medication Sig Dispense Refill  . acetaminophen (TYLENOL) 325 MG tablet Take 2 tablets (650 mg total) by mouth 2 (two) times daily as needed for moderate pain or headache.    Marland Kitchen aspirin EC 81 MG tablet Take 81 mg by mouth 3 (three) times a week.    . Calcium Carbonate-Vitamin D (CALCIUM PLUS VITAMIN D PO) Take 2 tablets by mouth daily.    . cetirizine (ZYRTEC) 10 MG tablet Take 10 mg by mouth daily as needed for allergies.    Marland Kitchen esomeprazole (NEXIUM) 40 MG capsule Take 40 mg by mouth daily before breakfast.   12  . furosemide (LASIX) 40 MG tablet Take 1 tablet (40 mg total) by mouth daily. 30 tablet 2  . latanoprost (XALATAN) 0.005 % ophthalmic solution Place 1  drop into both eyes at bedtime.     Marland Kitchen levothyroxine (SYNTHROID, LEVOTHROID) 25 MCG tablet Take 1 tablet (25 mcg total) by mouth daily before breakfast. 90 tablet 1  . lipase/protease/amylase (CREON) 12000 units CPEP capsule Take 12,000 Units by mouth 3 (three) times daily before meals.    . metoprolol tartrate (LOPRESSOR) 100 MG tablet Take 1 tablet (100 mg total) by mouth 2 (two) times daily. 180 tablet 3  . Multiple Vitamin (MULTIVITAMIN WITH MINERALS) TABS tablet Take 1 tablet by mouth daily. Centrum Silver.    Glory Rosebush DELICA LANCETS 51V MISC TEST SUGAR ONCE A WEEK 30 each 4  . ONETOUCH VERIO test strip CHECK SUGAR ONCE A WEEK 100 each 12  . potassium chloride SA (K-DUR,KLOR-CON) 20 MEQ tablet Take 20 mEq by mouth daily.     No current facility-administered medications on file prior to visit.    Allergies  Allergen Reactions  . Azithromycin Other (See Comments)    pancreatitis  . Penicillins Anaphylaxis, Hives, Swelling and Other (See Comments)    PATIENT HAS HAD A PCN REACTION WITH IMMEDIATE RASH, FACIAL/TONGUE/THROAT SWELLING, SOB, OR LIGHTHEADEDNESS WITH HYPOTENSION:  #  #  #  YES  #  #  #   HAS PT DEVELOPED SEVERE RASH INVOLVING MUCUS MEMBRANES or SKIN NECROSIS: #  #  #  YES  #  #   #  Has patient had a PCN reaction that required hospitalization: No Has patient had a PCN reaction occurring within the last 10 years: No   . Sulfa Antibiotics Hives and Rash  . Cheese Nausea And Vomiting   Social History   Socioeconomic History  . Marital status: Widowed    Spouse name: Not on file  . Number of children: Not on file  . Years of education: Not on file  . Highest education level: Master's degree (e.g., MA, MS, MEng, MEd, MSW, MBA)  Occupational History  . Occupation: Pharmacist, hospital (retired)  Social Needs  . Financial resource strain: Not on file  . Food insecurity:    Worry: Not on file    Inability: Not on file  . Transportation needs:    Medical: Not on file    Non-medical: Not on file  Tobacco Use  . Smoking status: Never Smoker  . Smokeless tobacco: Never Used  Substance and Sexual Activity  . Alcohol use: No  . Drug use: No  . Sexual activity: Yes  Lifestyle  . Physical activity:    Days per week: 3 days    Minutes per session: 30 min  . Stress: Not at all  Relationships  . Social connections:    Talks on phone: Not on file    Gets together: Not on file    Attends religious service: Not on file    Active member of club or organization: Not on file    Attends meetings of clubs or organizations: Not on file    Relationship status: Not on file  . Intimate partner violence:    Fear of current or ex partner: Not on file    Emotionally abused: Not on file    Physically abused: Not on file    Forced sexual activity: Not on file  Other Topics Concern  . Not on file  Social History Narrative   Husband passed away from Cancer in 09/18/17.     Review of Systems  All other systems reviewed and are negative.      Objective:   Physical Exam  Constitutional:  She appears well-developed and well-nourished.  Neck: Neck supple. No thyromegaly present.  Cardiovascular: Normal rate, regular rhythm and normal heart sounds.  Pulmonary/Chest: Effort normal and  breath sounds normal. No respiratory distress. She has no wheezes. She has no rales.  Vitals reviewed.         Assessment & Plan:  Hypothyroidism, unspecified type - Plan: TSH, BASIC METABOLIC PANEL WITH GFR  Patient denies any perceptible change since starting levothyroxine.  She denies any palpitations or tachycardia.  I will check her TSH today to ensure adequate dosage.  Recheck a BMP as well to monitor renal function as well as fasting blood sugar.  If there is continued decline in her renal function, the patient may be a candidate for the nephro protective effects of an angiotensin receptor blocker particular given her history of prediabetes.  Therefore I will tentatively plan to consider starting losartan 25 mg a day if her renal function continues to remain impaired

## 2018-05-23 ENCOUNTER — Other Ambulatory Visit: Payer: Self-pay | Admitting: Family Medicine

## 2018-05-23 ENCOUNTER — Encounter (INDEPENDENT_AMBULATORY_CARE_PROVIDER_SITE_OTHER): Payer: Self-pay

## 2018-05-23 MED ORDER — LEVOTHYROXINE SODIUM 50 MCG PO TABS: 50.0000 ug | ORAL_TABLET | Freq: Every day | ORAL | 3 refills | Status: DC

## 2018-06-26 ENCOUNTER — Ambulatory Visit (INDEPENDENT_AMBULATORY_CARE_PROVIDER_SITE_OTHER): Payer: Medicare Other | Admitting: *Deleted

## 2018-06-26 DIAGNOSIS — I442 Atrioventricular block, complete: Secondary | ICD-10-CM

## 2018-06-26 LAB — CUP PACEART REMOTE DEVICE CHECK
Battery Remaining Longevity: 131 mo
Battery Voltage: 3.06 V
Brady Statistic AP VP Percent: 0.02 %
Brady Statistic AP VS Percent: 0 %
Brady Statistic AS VP Percent: 99.78 %
Brady Statistic AS VS Percent: 0.2 %
Brady Statistic RA Percent Paced: 0.02 %
Brady Statistic RV Percent Paced: 99.8 %
Date Time Interrogation Session: 20190716112729
Implantable Lead Implant Date: 20181009
Implantable Lead Implant Date: 20181009
Implantable Lead Location: 753859
Implantable Lead Location: 753860
Implantable Lead Model: 5076
Implantable Lead Model: 5076
Implantable Pulse Generator Implant Date: 20181009
Lead Channel Impedance Value: 266 Ohm
Lead Channel Impedance Value: 342 Ohm
Lead Channel Impedance Value: 494 Ohm
Lead Channel Impedance Value: 532 Ohm
Lead Channel Pacing Threshold Amplitude: 1 V
Lead Channel Pacing Threshold Amplitude: 1.125 V
Lead Channel Pacing Threshold Pulse Width: 0.4 ms
Lead Channel Pacing Threshold Pulse Width: 0.4 ms
Lead Channel Sensing Intrinsic Amplitude: 2.625 mV
Lead Channel Sensing Intrinsic Amplitude: 2.625 mV
Lead Channel Sensing Intrinsic Amplitude: 3.625 mV
Lead Channel Setting Pacing Amplitude: 2 V
Lead Channel Setting Pacing Amplitude: 2.25 V
Lead Channel Setting Pacing Pulse Width: 0.4 ms
Lead Channel Setting Sensing Sensitivity: 2 mV

## 2018-06-26 NOTE — Progress Notes (Signed)
Remote pacemaker transmission.   

## 2018-06-27 ENCOUNTER — Encounter: Payer: Self-pay | Admitting: Cardiology

## 2018-07-01 ENCOUNTER — Other Ambulatory Visit: Payer: Self-pay | Admitting: Cardiology

## 2018-07-18 ENCOUNTER — Other Ambulatory Visit: Payer: Medicare Other

## 2018-07-18 DIAGNOSIS — E039 Hypothyroidism, unspecified: Secondary | ICD-10-CM

## 2018-07-18 LAB — TSH: TSH: 4.21 mIU/L (ref 0.40–4.50)

## 2018-07-18 NOTE — Progress Notes (Signed)
HPI The patient presents for evaluation of mitral regurgitation. Transthoracic echo in the past demonstrated moderate to severe mitral regurgitation with mitral valve prolapse and moderate stenosis . However, followup TEE suggested the MR to be moderate. Echo in Jan suggested severe stenosis with moderate  MR and she also had moderate AI.  She is now status post minimally invasive MVR with a bioprosthesis.  In Nov 2018.  She had CHB and required pacemaker placement.  She returns for followup.  Since I last saw her she saw Dr. Rayann Heman and had normal LV function.  When I last saw her her HR was increased so I increased her beta blocker.  She had residual pleural effusions that was smaller on follow up CXR in Feb.    She feels better than when I last saw her.  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  This is heavy work. The patient denies any new symptoms such as chest discomfort, neck or arm discomfort. There has been no new shortness of breath, PND or orthopnea. There have been no reported palpitations, presyncope or syncope.  Her only issue has been fatigue.  This has been for about 2 to 3 weeks.  I do notice that her TSH was normal and she has had a recent adjustment of her Synthroid by her primary provider.  Allergies  Allergen Reactions  . Azithromycin Other (See Comments)    pancreatitis  . Penicillins Anaphylaxis, Hives, Swelling and Other (See Comments)    PATIENT HAS HAD A PCN REACTION WITH IMMEDIATE RASH, FACIAL/TONGUE/THROAT SWELLING, SOB, OR LIGHTHEADEDNESS WITH HYPOTENSION:  #  #  #  YES  #  #  #   HAS PT DEVELOPED SEVERE RASH INVOLVING MUCUS MEMBRANES or SKIN NECROSIS: #  #  #  YES  #  #  #  Has patient had a PCN reaction that required hospitalization: No Has patient had a PCN reaction occurring within the last 10 years: No   . Sulfa Antibiotics Hives and  Rash  . Cheese Nausea And Vomiting    Current Outpatient Medications  Medication Sig Dispense Refill  . acetaminophen (TYLENOL) 325 MG tablet Take 2 tablets (650 mg total) by mouth 2 (two) times daily as needed for moderate pain or headache.    Marland Kitchen aspirin EC 81 MG tablet Take 81 mg by mouth 3 (three) times a week.    . Calcium Carbonate-Vitamin D (CALCIUM PLUS VITAMIN D PO) Take 2 tablets by mouth daily.    . cetirizine (ZYRTEC) 10 MG tablet Take 10 mg by mouth daily as needed for allergies.    . chlorthalidone (HYGROTON) 25 MG tablet TAKE 1 TABLET BY MOUTH EVERY DAY 90 tablet 0  . esomeprazole (NEXIUM) 40 MG capsule Take 40 mg by mouth daily before breakfast.   12  . furosemide (LASIX) 40 MG tablet Take 1 tablet (40 mg total) by mouth daily. 30 tablet 2  . latanoprost (XALATAN) 0.005 % ophthalmic solution Place 1 drop into both eyes at bedtime.     Marland Kitchen levothyroxine (SYNTHROID, LEVOTHROID) 50 MCG tablet Take 1 tablet (50 mcg total) by mouth daily. 90 tablet 3  . lipase/protease/amylase (CREON) 12000 units CPEP capsule Take 12,000 Units by mouth 3 (three) times daily before meals.    . Multiple Vitamin (MULTIVITAMIN WITH MINERALS) TABS tablet Take 1 tablet by mouth daily. Centrum Silver.    Marland Kitchen  ONETOUCH DELICA LANCETS 35T MISC TEST SUGAR ONCE A WEEK 30 each 4  . ONETOUCH VERIO test strip CHECK SUGAR ONCE A WEEK 100 each 12  . potassium chloride SA (K-DUR,KLOR-CON) 20 MEQ tablet Take 20 mEq by mouth daily.    . metoprolol tartrate (LOPRESSOR) 100 MG tablet Take 1 tablet (100 mg total) by mouth 2 (two) times daily. 180 tablet 3   No current facility-administered medications for this visit.     Past Medical History:  Diagnosis Date  . Atrial flutter (HCC)    Ablated 1998 Dr. Caryl Comes  . Bleeding gastric ulcer    back in 2000  . Breast cancer ([place])   . COPD (chronic obstructive pulmonary disease) (HCC)    from radiation  . GERD (gastroesophageal reflux disease)   . Hodgkin's lymphoma ([place])     Treated with radiation and chemo  . Mitral regurgitation    pvc  . Osteoporosis    lumbar verterbral fracture, left ankle fracture, dexa 2015  . Pancreatitis   . Pneumonia   . Prediabetes   . 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 Ulcer     Past Surgical History:  Procedure Laterality Date  . BREAST SURGERY     breast cancer since 2005   left mastectomy  . CARDIAC ELECTROPHYSIOLOGY [place] AND ABLATION  1999  . CATARACT EXTRACTION, BILATERAL    . COLONOSCOPY    . IR RADIOLOGY PERIPHERAL GUIDED IV START  08/03/2017  . IR US GUIDE VASC ACCESS RIGHT  08/03/2017  . LAPAROTOMY    . MASTECTOMY  2005   Left-axillary  . MITRAL VALVE REPAIR Right 09/13/2017   Procedure: MINIMALLY INVASIVE MITRAL VALVE REPLACEMENT (MVR) with Medtronic Mosaic Porcine Heart Valve size 27mm;  Surgeon: Rexene Alberts, MD;  Location: [place];  Service: Open Heart Surgery;  Laterality: Right;  . ORIF ANKLE FRACTURE Left 03/21/2014   Procedure: LEFT ANKLE FRACTURE OPEN TREATMENT BILMALLEOLAR INCLUDES INTERNAL FIXATION ;  Surgeon: Renette Butters, MD;  Location: [place];  Service: Orthopedics;  Laterality: Left;  . PACEMAKER IMPLANT N/A 09/19/2017   Medtronic Azure XT MRI conditional dual-chamber pacemaker for symptomatic complete heart block  by Dr Rayann Heman  . RIGHT/LEFT HEART CATH AND CORONARY ANGIOGRAPHY N/A 06/27/2017   Procedure: Right/Left Heart Cath and Coronary Angiography;  Surgeon: Larey [name], MD;  Location: [place] [facility];  Service: Cardiovascular;  Laterality: N/A;  . 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 TEE WITHOUT CARDIOVERSION  06/12/2012   Procedure: TRANSESOPHAGEAL ECHOCARDIOGRAM (TEE);  Surgeon: Larey [name], MD;  Location: [place];  Service: Cardiovascular;  Laterality: N/A;  . TEE WITHOUT CARDIOVERSION N/A 06/29/2017   Procedure: TRANSESOPHAGEAL  ECHOCARDIOGRAM (TEE);  Surgeon: Jerline Pain, MD;  [facility] ENDOSCOPY;  Service: Cardiovascular;  Laterality: N/A;  . TEE WITHOUT CARDIOVERSION N/A 09/13/2017   Procedure: TRANSESOPHAGEAL ECHOCARDIOGRAM (TEE);  Surgeon: Rexene Alberts, MD;  Location: [place];  Service: Open Heart Surgery;  Laterality: N/A;  . TONSILLECTOMY AND ADENOIDECTOMY      ROS:   As stated in the HPI and negative for all other systems.   PHYSICAL EXAM BP 124/62   Pulse 85   Ht 5\' 6"  (1.676 m)   Wt 126 lb (57.2 kg)   BMI 20.34 kg/m   GENERAL:  Well appearing NECK:  No jugular venous distention, waveform within normal limits, carotid  upstroke brisk and symmetric, no bruits, no thyromegaly LUNGS:  Clear to auscultation bilaterally CHEST:   Well healed scar.  HEART:  PMI not displaced or sustained,S1 and S2 within normal limits, no S3, no S4, no clicks, no rubs, 2 out of 6 apical systolic murmur heard best at the apex and radiating out the aortic outflow tract, no diastolic murmurs ABD:  Flat, positive bowel sounds normal in frequency in pitch, no bruits, no rebound, no guarding, no midline pulsatile mass, no hepatomegaly, no splenomegaly EXT:  2 plus pulses throughout, no edema, no cyanosis no clubbing   EKG:  NA  ASSESSMENT AND PLAN  MVR:    She had normal valve function in Nov  2018.  I will repeat an echo in Nov.  AI:    This was mild.  I will follow this clinically.    PULMONARY HTN:   This will be followed with the echo.   FLUTTER:  She had fib post op.  She has had no symptomatic recurrence of this.  No change in therapy.  I did review the recent device interrogation and there was a 9 beat run of NSVT but no evidence of recurrent atrial fibrillation/flutter.  HTN:    The blood pressure is at target. No change in medications is indicated. We will continue with therapeutic lifestyle changes (TLC).  CHB:  With PPM.   She is up to date with follow up.    FATIGUE:  I will check a CBC.  This could be  related to beta blocker but I would be reluctant to reduce this med as it controls BP and HR.

## 2018-07-19 ENCOUNTER — Encounter: Payer: Self-pay | Admitting: Cardiology

## 2018-07-19 ENCOUNTER — Ambulatory Visit: Payer: Medicare Other | Admitting: Cardiology

## 2018-07-19 VITALS — BP 124/62 | HR 85 | Ht 66.0 in | Wt 126.0 lb

## 2018-07-19 DIAGNOSIS — I050 Rheumatic mitral stenosis: Secondary | ICD-10-CM | POA: Diagnosis not present

## 2018-07-19 DIAGNOSIS — I483 Typical atrial flutter: Secondary | ICD-10-CM

## 2018-07-19 DIAGNOSIS — Z953 Presence of xenogenic heart valve: Secondary | ICD-10-CM | POA: Diagnosis not present

## 2018-07-19 DIAGNOSIS — R5383 Other fatigue: Secondary | ICD-10-CM

## 2018-07-19 DIAGNOSIS — I10 Essential (primary) hypertension: Secondary | ICD-10-CM

## 2018-07-19 DIAGNOSIS — I272 Pulmonary hypertension, unspecified: Secondary | ICD-10-CM | POA: Diagnosis not present

## 2018-07-19 LAB — CBC
Hematocrit: 38.3 % (ref 34.0–46.6)
Hemoglobin: 13.4 g/dL (ref 11.1–15.9)
MCH: 29.9 pg (ref 26.6–33.0)
MCHC: 35 g/dL (ref 31.5–35.7)
MCV: 86 fL (ref 79–97)
Platelets: 468 10*3/uL — ABNORMAL HIGH (ref 150–450)
RBC: 4.48 x10E6/uL (ref 3.77–5.28)
RDW: 14.6 % (ref 12.3–15.4)
WBC: 7.5 10*3/uL (ref 3.4–10.8)

## 2018-07-19 NOTE — Patient Instructions (Signed)
NO MEDICATION CHANGES  LABS CBC TODAY.  SCHEDULE AT [place] 2019 Your physician has requested that you have an echocardiogram. Echocardiography is a painless test that uses sound waves to create images of your heart. It provides your doctor with information about the size and shape of your heart and how well your heart's chambers and valves are working. This procedure takes approximately one hour. There are no restrictions for this procedure.     Your physician wants you to follow-up in [place].You will receive a reminder letter in the mail two months in advance. If you don't receive a letter, please call our office to schedule the follow-up appointment.    If you need a refill on your cardiac medications before your next appointment, please call your pharmacy.

## 2018-09-17 ENCOUNTER — Other Ambulatory Visit: Payer: Self-pay

## 2018-09-17 ENCOUNTER — Encounter: Payer: Self-pay | Admitting: Thoracic Surgery (Cardiothoracic Vascular Surgery)

## 2018-09-17 ENCOUNTER — Ambulatory Visit: Payer: Medicare Other | Admitting: Thoracic Surgery (Cardiothoracic Vascular Surgery)

## 2018-09-17 VITALS — BP 117/62 | HR 80 | Resp 16 | Ht 66.0 in | Wt 126.0 lb

## 2018-09-17 DIAGNOSIS — Z953 Presence of xenogenic heart valve: Secondary | ICD-10-CM | POA: Diagnosis not present

## 2018-09-17 NOTE — Patient Instructions (Addendum)

## 2018-09-17 NOTE — Progress Notes (Signed)
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 She has otherwise recovered uneventfully and she was last seen here in our office on  December 18, 2017.  Since then she has been followed carefully by Dr. Percival Spanish and she returns to our office today for routine follow-up.  She reports that she is doing very well.  Overall she states that she feels "much improved" in comparison with how she felt prior to surgery.  She is back to normal physical activity without any significant limitations.  She states that she gets short of breath only with relatively strenuous physical activity and she recovers quickly when she stops to rest.  This is dramatically better than it was prior to surgery.  She has no chest pain or chest tightness.  Overall she is quite pleased with her progress.   Current Outpatient Medications  Medication Sig Dispense Refill  . acetaminophen (TYLENOL) 325 MG tablet Take 2 tablets (650 mg total) by mouth 2 (two) times daily as needed for  moderate pain or headache.    Marland Kitchen aspirin EC 81 MG tablet Take 81 mg by mouth 3 (three) times a week.    . Calcium Carbonate-Vitamin D (CALCIUM PLUS VITAMIN D PO) Take 2 tablets by mouth daily.    . cetirizine (ZYRTEC) 10 MG tablet Take 10 mg by mouth daily as needed for allergies.    . chlorthalidone (HYGROTON) 25 MG tablet TAKE 1 TABLET BY MOUTH EVERY DAY 90 tablet 0  . esomeprazole (NEXIUM) 40 MG capsule Take 40 mg by mouth daily before breakfast.   12  . furosemide (LASIX) 40 MG tablet Take 1 tablet (40 mg total) by mouth daily. 30 tablet 2  . latanoprost (XALATAN) 0.005 % ophthalmic solution Place 1 drop into both eyes at bedtime.     Marland Kitchen levothyroxine (SYNTHROID, LEVOTHROID) 50 MCG tablet Take 1 tablet (50 mcg total) by mouth daily. 90 tablet 3  . lipase/protease/amylase (CREON) 12000 units CPEP capsule Take 12,000 Units by mouth 3 (three) times daily before meals.    . metoprolol tartrate (LOPRESSOR) 100 MG tablet Take 1 tablet (100 mg total) by mouth 2 (two) times daily. 180 tablet 3  . Multiple Vitamin (MULTIVITAMIN WITH MINERALS) TABS tablet Take 1 tablet by mouth daily. Centrum Silver.    Glory Rosebush DELICA LANCETS 91M MISC TEST SUGAR ONCE A WEEK 30 each 4  . ONETOUCH VERIO test strip CHECK SUGAR ONCE A WEEK 100 each 12  . potassium chloride SA (K-DUR,KLOR-CON) 20 MEQ tablet Take 20 mEq by mouth daily.     No current facility-administered medications for this  visit.       Physical Exam:   BP 117/62 (BP Location: Right Arm, Patient Position: Sitting, Cuff Size: Normal)   Pulse 80   Resp 16   Ht 5\' 6"  (1.676 m)   Wt 126 lb (57.2 kg)   SpO2 98% Comment: ON RA  BMI 20.34 kg/m   General:  Well-appearing  Chest:   Clear to auscultation  CV:   Regular rate and rhythm without murmur  Incisions:  Completely healed  Abdomen:  Soft nontender  Extremities:  Warm and well-perfused, no edema  Diagnostic Tests:  Transthoracic Echocardiography  Patient:    Patricia, [name] MR #:        [id number] Study Date: 11/10/2017 Gender:     F Age:        46 Height:     167.6 cm Weight:     59 kg BSA:        1.66 m^2 Pt. Status: Room:   SONOGRAPHER  Diamond Nickel  ATTENDING    Barrett, Evelene Croon  ORDERING     Barrett, Evelene Croon  REFERRING    Barrett, Evelene Croon  PERFORMING   [facility], Outpatient  cc:  ------------------------------------------------------------------- LV EF: 50% -   55%  ------------------------------------------------------------------- Indications:      Z95.3 Mitral valve replacement.  ------------------------------------------------------------------- History:   PMH:  Hodgkin&'s lymphoma. Breast cancer. Status post mastectomy. Prediabetes.  Atrial flutter.  Chronic obstructive pulmonary disease.  ------------------------------------------------------------------- Study Conclusions  - Left ventricle: The cavity size was normal. Wall thickness was   normal. Systolic function was normal. The estimated ejection   fraction was in the range of 50% to 55%. There is hypokinesis of   the septal myocardium. - Aortic valve: There was mild regurgitation. - Mitral valve: A bioprosthesis was present. Valve area by   continuity equation (using LVOT flow): 0.69 cm^2. - Left atrium: The atrium was mildly dilated. - Pulmonary arteries: Systolic pressure was moderately to severely   increased. PA peak pressure: 63 mm Hg (S).  Impressions:  - Septal hypokinesis with overall preserved LV systolic function;   sclerotic aortic valve with mild AI; s/p MVR with mean gradient   of 7 mmHg and no MR; mild LAE; mild TR with moderate to severe   pulmonary hypertension.  ------------------------------------------------------------------- Study data:  Comparison was made to the study of 09/13/2017.  Study status:  Routine.  Procedure:  The patient reported no pain pre or post test. Transthoracic echocardiography. Image quality was adequate.  Study completion:   There were no complications. Transthoracic echocardiography.  M-mode, complete 2D, spectral Doppler, and color Doppler.  Birthdate:  Patient birthdate: 1951-05-06.  Age:  Patient is 67 yr old.  Sex:  Gender: female. BMI: 21 kg/m^2.  Blood pressure:     126/71  Patient status: Outpatient.  Study date:  Study date: 11/10/2017. Study time: 01:06 PM.  Location:  [facility]  -------------------------------------------------------------------  ------------------------------------------------------------------- Left ventricle:  The cavity size was normal. Wall thickness was normal. Systolic function was normal. The estimated ejection fraction was in the range of 50% to 55%.  Regional wall motion abnormalities:  There is hypokinesis of the septal myocardium.  ------------------------------------------------------------------- Aortic valve:   Trileaflet; mildly thickened leaflets. Cusp separation was normal. Mobility was not restricted.  Doppler: Transvalvular velocity was within the normal range. There was no stenosis. There was mild regurgitation.  ------------------------------------------------------------------- Aorta:  Aortic root: The aortic root was normal in size.  ------------------------------------------------------------------- Mitral valve:  A bioprosthesis  was present. Mobility was not restricted.  Doppler:  Transvalvular velocity was within the normal range. There was no evidence for stenosis. There was no regurgitation.    Valve area by pressure half-time: 4.49 cm^2. Indexed valve area by pressure half-time: 2.71 cm^2/m^2. Valve area by continuity equation (using LVOT flow): 0.69 cm^2. Indexed valve area by continuity equation (using LVOT flow): 0.42 cm^2/m^2. Mean gradient (D): 7 mm Hg.  ------------------------------------------------------------------- Left atrium:  The atrium was mildly  dilated.  ------------------------------------------------------------------- Right ventricle:  The cavity size was normal. Pacer wire or catheter noted in right ventricle. Systolic function was normal.   ------------------------------------------------------------------- Pulmonic valve:    Doppler:  Transvalvular velocity was within the normal range. There was no evidence for stenosis. There was mild regurgitation.  ------------------------------------------------------------------- Tricuspid valve:   Structurally normal valve.    Doppler: Transvalvular velocity was within the normal range. There was mild regurgitation.  ------------------------------------------------------------------- Pulmonary artery:   Systolic pressure was moderately to severely increased.  ------------------------------------------------------------------- Right atrium:  The atrium was normal in size. Pacer wire or catheter noted in right atrium.  ------------------------------------------------------------------- Pericardium:  There was no pericardial effusion.  ------------------------------------------------------------------- Systemic veins: Inferior vena cava: The vessel was dilated.  ------------------------------------------------------------------- Measurements   Left ventricle                           Value          Reference  LV ID, ED, PLAX chordal          (L)     31.2  mm       43 - 52  LV ID, ES, PLAX chordal                  24.6  mm       23 - 38  LV fx shortening, PLAX chordal   (L)     21    %        >=29  LV PW thickness, ED                      11.5  mm       ----------  IVS/LV PW ratio, ED                      0.99           <=1.3  Stroke volume, 2D                        30    ml       ----------  Stroke volume/bsa, 2D                    18    ml/m^2   ----------    Ventricular septum                       Value          Reference  IVS thickness, ED                         11.4  mm       ----------    LVOT  Value          Reference  LVOT ID, S                               16    mm       ----------  LVOT area                                2.01  cm^2     ----------  LVOT peak velocity, S                    76    cm/s     ----------  LVOT mean velocity, S                    46    cm/s     ----------  LVOT VTI, S                              15    cm       ----------    Aortic valve                             Value          Reference  Aortic regurg peak velocity              315   cm/s     ----------  Aortic regurg pressure half-time         230   ms       ----------  Aortic regurg peak gradient              40    mm Hg    ----------    Aorta                                    Value          Reference  Aortic root ID, ED                       25    mm       ----------    Left atrium                              Value          Reference  LA ID, A-P, ES                           30    mm       ----------  LA ID/bsa, A-P                           1.81  cm/m^2   <=2.2    Mitral valve                             Value          Reference  Mitral mean velocity, D  114   cm/s     ----------  Mitral pressure half-time                49    ms       ----------  Mitral mean gradient, D                  7     mm Hg    ----------  Mitral valve area, PHT, DP               4.49  cm^2     ----------  Mitral valve area/bsa, PHT, DP           2.71  cm^2/m^2 ----------  Mitral valve area, LVOT                  0.69  cm^2     ----------  continuity  Mitral valve area/bsa, LVOT              0.42  cm^2/m^2 ----------  continuity  Mitral annulus VTI, D                    43.4  cm       ----------    Pulmonary arteries                       Value          Reference  PA pressure, S, DP               (H)     63    mm Hg    <=30    Tricuspid valve                          Value          Reference  Tricuspid regurg peak  velocity           346   cm/s     ----------  Tricuspid peak RV-RA gradient            48    mm Hg    ----------    Right atrium                             Value          Reference  RA ID, S-I, ES, A4C                      48.7  mm       34 - 49  RA area, ES, A4C                         16.6  cm^2     8.3 - 19.5  RA volume, ES, A/L                       46.2  ml       ----------  RA volume/bsa, ES, A/L                   27.9  ml/m^2   ----------    Right ventricle                          Value  Reference  RV pressure, S, DP               (H)     63    mm Hg    <=30  RV s&', lateral, S                        5.44  cm/s     ----------    Pulmonic valve                           Value          Reference  Pulmonic regurg velocity, ED             145   cm/s     ----------  Pulmonic regurg gradient, ED             8     mm Hg    ----------  Legend: (L)  and  (H)  mark values outside specified reference range.  ------------------------------------------------------------------- Prepared and Electronically Authenticated by  Kirk Ruths 2018-11-30T14:07:10   Impression:  Patient is doing well approximately 1 year status post minimally invasive mitral valve replacement using a bioprosthetic tissue valve for radiation-induced valvular disease with mitral stenosis and mitral regurgitation.  Plan:  We have not recommended any change the patient's current medications.  I have encouraged the patient to continue to increase her activity without any particular limitations.  The patient has been reminded regarding the importance of dental hygiene and the lifelong need for antibiotic prophylaxis for all dental cleanings and other related invasive procedures.   I spent in excess of 15 minutes during the conduct of this office consultation and >50% of this time involved direct face-to-face encounter with the patient for counseling and/or coordination of their care.    Valentina Gu.  Roxy Manns, MD 09/17/2018 10:42 AM

## 2018-09-18 ENCOUNTER — Ambulatory Visit: Payer: Self-pay | Admitting: Pharmacist Clinician (PhC)/ Clinical Pharmacy Specialist

## 2018-09-18 DIAGNOSIS — Z953 Presence of xenogenic heart valve: Secondary | ICD-10-CM

## 2018-09-18 DIAGNOSIS — Z7901 Long term (current) use of anticoagulants: Secondary | ICD-10-CM

## 2018-09-20 ENCOUNTER — Ambulatory Visit: Payer: Medicare Other | Admitting: Physician Assistant

## 2018-09-20 ENCOUNTER — Encounter: Payer: Self-pay | Admitting: Physician Assistant

## 2018-09-20 VITALS — BP 124/76 | HR 85 | Temp 98.7°F | Resp 16 | Ht 66.0 in | Wt 134.4 lb

## 2018-09-20 DIAGNOSIS — J988 Other specified respiratory disorders: Secondary | ICD-10-CM

## 2018-09-20 DIAGNOSIS — B9689 Other specified bacterial agents as the cause of diseases classified elsewhere: Secondary | ICD-10-CM | POA: Diagnosis not present

## 2018-09-20 MED ORDER — DOXYCYCLINE HYCLATE 100 MG PO TABS: 100.0000 mg | ORAL_TABLET | Freq: Two times a day (BID) | ORAL | 0 refills | Status: DC

## 2018-09-20 NOTE — Progress Notes (Signed)
Patient ID: Patricia [name] MRN: [id number], DOB: [birth year], 67 y.o. Date of Encounter: 09/20/2018, 10:41 AM    Chief Complaint:  Chief Complaint  Patient presents with  . Sinusitis    Onset Monday. Has pressure in ears     HPI: 67 y.o. year old female presents with above.   She is having nasal and sinus congestion.  Blowing her nose.  Some pressure in her ears.  Draiange down her throat but not down into her chest.  Feels like so far has not had any chest congestion.  Has had no fevers or chills.     Home Meds:   Outpatient Medications Prior to Visit  Medication Sig Dispense Refill  . acetaminophen (TYLENOL) 325 MG tablet Take 2 tablets (650 mg total) by mouth 2 (two) times daily as needed for moderate pain or headache.    Marland Kitchen aspirin EC 81 MG tablet Take 81 mg by mouth 3 (three) times a week.    . Calcium Carbonate-Vitamin D (CALCIUM PLUS VITAMIN D PO) Take 2 tablets by mouth daily.    . cetirizine (ZYRTEC) 10 MG tablet Take 10 mg by mouth daily as needed for allergies.    . chlorthalidone (HYGROTON) 25 MG tablet TAKE 1 TABLET BY MOUTH EVERY DAY 90 tablet 0  . esomeprazole (NEXIUM) 40 MG capsule Take 40 mg by mouth daily before breakfast.   12  . furosemide (LASIX) 40 MG tablet Take 1 tablet (40 mg total) by mouth daily. 30 tablet 2  . latanoprost (XALATAN) 0.005 % ophthalmic solution Place 1 drop into both eyes at bedtime.     Marland Kitchen levothyroxine (SYNTHROID, LEVOTHROID) 50 MCG tablet Take 1 tablet (50 mcg total) by mouth daily. 90 tablet 3  . lipase/protease/amylase (CREON) 12000 units CPEP capsule Take 12,000 Units by mouth 3 (three) times daily before meals.    . Multiple Vitamin (MULTIVITAMIN WITH MINERALS) TABS tablet Take 1 tablet by mouth daily. Centrum Silver.    Glory Rosebush DELICA LANCETS 95K MISC TEST SUGAR ONCE A WEEK 30 each 4  . ONETOUCH VERIO test strip CHECK SUGAR ONCE A WEEK 100 each 12  . potassium chloride SA (K-DUR,KLOR-CON) 20 MEQ tablet Take 20 mEq by mouth  daily.    . metoprolol tartrate (LOPRESSOR) 100 MG tablet Take 1 tablet (100 mg total) by mouth 2 (two) times daily. 180 tablet 3   No facility-administered medications prior to visit.     Allergies:  Allergies  Allergen Reactions  . Azithromycin Other (See Comments)    pancreatitis  . Penicillins Anaphylaxis, Hives, Swelling and Other (See Comments)    PATIENT HAS HAD A PCN REACTION WITH IMMEDIATE RASH, FACIAL/TONGUE/THROAT SWELLING, SOB, OR LIGHTHEADEDNESS WITH HYPOTENSION:  #  #  #  YES  #  #  #   HAS PT DEVELOPED SEVERE RASH INVOLVING MUCUS MEMBRANES or SKIN NECROSIS: #  #  #  YES  #  #  #  Has patient had a PCN reaction that required hospitalization: No Has patient had a PCN reaction occurring within the last 10 years: No   . Sulfa Antibiotics Hives and Rash  . Cheese Nausea And Vomiting      Review of Systems: See HPI for pertinent ROS. All other ROS negative.    Physical Exam: Blood pressure 124/76, pulse 85, temperature 98.7 F (37.1 C), temperature source Oral, resp. rate 16, height 5\' 6"  (1.676 m), weight 61 kg, SpO2 97 %., Body mass index is 21.69 kg/m. General:  WNWD WF. Appears in no acute distress. HEENT: Normocephalic, atraumatic, eyes without discharge, sclera non-icteric, nares are without discharge. Bilateral auditory canals clear, TM's are without perforation, pearly grey and translucent with reflective cone of light bilaterally. Oral cavity moist, posterior pharynx without exudate, erythema.  No tenderness with percussion to frontal or maxillary sinuses.  Neck: Supple. No thyromegaly. No lymphadenopathy. Lungs: Clear bilaterally to auscultation without wheezes, rales, or rhonchi. Breathing is unlabored. Heart: Regular rhythm.  Msk:  Strength and tone normal for age. Extremities/Skin: Warm and dry.  Neuro: Alert and oriented X 3. Moves all extremities spontaneously. Gait is normal. CNII-XII grossly in tact. Psych:  Responds to questions appropriately with a  normal affect.     ASSESSMENT AND PLAN:  67 y.o. year old female with  1. Bacterial respiratory infection She has allergy to penicillins, azithromycin, sulfa. She is to take the doxycycline as directed.  Follow-up if symptoms worsen significantly or do not resolve upon completion of antibiotic. - doxycycline (VIBRA-TABS) 100 MG tablet; Take 1 tablet (100 mg total) by mouth 2 (two) times daily.  Dispense: 20 tablet; Refill: 0   Signed, [address] [place], [place], [facility] 09/20/2018 10:41 AM

## 2018-09-25 ENCOUNTER — Ambulatory Visit (INDEPENDENT_AMBULATORY_CARE_PROVIDER_SITE_OTHER): Payer: Medicare Other | Admitting: *Deleted

## 2018-09-25 DIAGNOSIS — I442 Atrioventricular block, complete: Secondary | ICD-10-CM

## 2018-09-26 NOTE — Progress Notes (Signed)
Remote pacemaker transmission.   

## 2018-10-05 ENCOUNTER — Other Ambulatory Visit: Payer: Self-pay | Admitting: Cardiology

## 2018-10-09 ENCOUNTER — Telehealth: Payer: Self-pay

## 2018-10-09 NOTE — Research (Signed)
[place] Sync Informed Consent   Subject Name: Patricia [name]  Subject met inclusion and exclusion criteria.  The informed consent form, study requirements and expectations were reviewed with the subject and questions and concerns were addressed prior to the signing of the consent form.  The subject verbalized understanding of the trail requirements.  The subject agreed to participate in the [facility] trial and signed the informed consent.  The informed consent was obtained prior to performance of any protocol-specific procedures for the subject.  A copy of the signed informed consent was given to the subject and a copy was placed in the subject's medical record.  Patricia [name] S [name] 10/09/2018, 12:00 PM

## 2018-10-09 NOTE — Telephone Encounter (Signed)
Called patient to complete annual [employer] survey.  

## 2018-10-19 ENCOUNTER — Other Ambulatory Visit: Payer: Self-pay

## 2018-10-19 ENCOUNTER — Ambulatory Visit (HOSPITAL_COMMUNITY): Payer: Medicare Other | Attending: Internal Medicine

## 2018-10-19 DIAGNOSIS — I050 Rheumatic mitral stenosis: Secondary | ICD-10-CM | POA: Diagnosis present

## 2018-10-19 DIAGNOSIS — Z953 Presence of xenogenic heart valve: Secondary | ICD-10-CM

## 2018-10-19 LAB — CUP PACEART REMOTE DEVICE CHECK
Battery Remaining Longevity: 138 mo
Battery Voltage: 3.04 V
Brady Statistic AP VP Percent: 0.02 %
Brady Statistic AP VS Percent: 0 %
Brady Statistic AS VP Percent: 99.97 %
Brady Statistic AS VS Percent: 0.01 %
Brady Statistic RA Percent Paced: 0.02 %
Brady Statistic RV Percent Paced: 99.99 %
Date Time Interrogation Session: 20191015112911
Implantable Lead Implant Date: 20181009
Implantable Lead Implant Date: 20181009
Implantable Lead Location: 753859
Implantable Lead Location: 753860
Implantable Lead Model: 5076
Implantable Lead Model: 5076
Implantable Pulse Generator Implant Date: 20181009
Lead Channel Impedance Value: 266 Ohm
Lead Channel Impedance Value: 342 Ohm
Lead Channel Impedance Value: 475 Ohm
Lead Channel Impedance Value: 513 Ohm
Lead Channel Pacing Threshold Amplitude: 0.875 V
Lead Channel Pacing Threshold Amplitude: 1 V
Lead Channel Pacing Threshold Pulse Width: 0.4 ms
Lead Channel Pacing Threshold Pulse Width: 0.4 ms
Lead Channel Sensing Intrinsic Amplitude: 2.5 mV
Lead Channel Sensing Intrinsic Amplitude: 2.5 mV
Lead Channel Sensing Intrinsic Amplitude: 3.625 mV
Lead Channel Setting Pacing Amplitude: 2 V
Lead Channel Setting Pacing Amplitude: 2 V
Lead Channel Setting Pacing Pulse Width: 0.4 ms
Lead Channel Setting Sensing Sensitivity: 2 mV

## 2018-12-13 ENCOUNTER — Other Ambulatory Visit: Payer: Self-pay | Admitting: Cardiology

## 2018-12-27 ENCOUNTER — Telehealth: Payer: Self-pay

## 2018-12-27 ENCOUNTER — Ambulatory Visit (INDEPENDENT_AMBULATORY_CARE_PROVIDER_SITE_OTHER): Payer: Medicare Other

## 2018-12-27 DIAGNOSIS — I5032 Chronic diastolic (congestive) heart failure: Secondary | ICD-10-CM

## 2018-12-27 DIAGNOSIS — I442 Atrioventricular block, complete: Secondary | ICD-10-CM

## 2018-12-27 NOTE — Telephone Encounter (Signed)
Spoke with patient to remind of missed remote transmission 

## 2018-12-28 NOTE — Progress Notes (Signed)
Remote pacemaker transmission.   

## 2018-12-29 LAB — CUP PACEART REMOTE DEVICE CHECK
Battery Remaining Longevity: 135 mo
Battery Voltage: 3.02 V
Brady Statistic AP VP Percent: 0.01 %
Brady Statistic AP VS Percent: 0 %
Brady Statistic AS VP Percent: 99.99 %
Brady Statistic AS VS Percent: 0.01 %
Brady Statistic RA Percent Paced: 0.01 %
Brady Statistic RV Percent Paced: 99.99 %
Date Time Interrogation Session: 20200116172240
Implantable Lead Implant Date: 20181009
Implantable Lead Implant Date: 20181009
Implantable Lead Location: 753859
Implantable Lead Location: 753860
Implantable Lead Model: 5076
Implantable Lead Model: 5076
Implantable Pulse Generator Implant Date: 20181009
Lead Channel Impedance Value: 266 Ohm
Lead Channel Impedance Value: 361 Ohm
Lead Channel Impedance Value: 456 Ohm
Lead Channel Impedance Value: 513 Ohm
Lead Channel Pacing Threshold Amplitude: 0.875 V
Lead Channel Pacing Threshold Amplitude: 0.875 V
Lead Channel Pacing Threshold Pulse Width: 0.4 ms
Lead Channel Pacing Threshold Pulse Width: 0.4 ms
Lead Channel Sensing Intrinsic Amplitude: 2.5 mV
Lead Channel Sensing Intrinsic Amplitude: 2.5 mV
Lead Channel Sensing Intrinsic Amplitude: 3.625 mV
Lead Channel Setting Pacing Amplitude: 1.75 V
Lead Channel Setting Pacing Amplitude: 2 V
Lead Channel Setting Pacing Pulse Width: 0.4 ms
Lead Channel Setting Sensing Sensitivity: 2 mV

## 2019-01-25 ENCOUNTER — Other Ambulatory Visit: Payer: Self-pay | Admitting: Physician Assistant

## 2019-01-25 NOTE — Telephone Encounter (Signed)
This is Dr. Hochrein's pt. °

## 2019-02-18 ENCOUNTER — Ambulatory Visit (INDEPENDENT_AMBULATORY_CARE_PROVIDER_SITE_OTHER): Payer: Medicare Other | Admitting: Internal Medicine

## 2019-02-18 ENCOUNTER — Encounter: Payer: Self-pay | Admitting: Internal Medicine

## 2019-02-18 VITALS — BP 128/76 | HR 91 | Ht 66.0 in | Wt 125.4 lb

## 2019-02-18 DIAGNOSIS — I5032 Chronic diastolic (congestive) heart failure: Secondary | ICD-10-CM

## 2019-02-18 DIAGNOSIS — Z950 Presence of cardiac pacemaker: Secondary | ICD-10-CM

## 2019-02-18 DIAGNOSIS — I442 Atrioventricular block, complete: Secondary | ICD-10-CM | POA: Diagnosis not present

## 2019-02-18 NOTE — Progress Notes (Signed)
PCP: Susy Frizzle, MD Primary Cardiologist: Dr Percival Spanish Primary EP:  Dr Rayann Heman  Patricia [name] is a 68 y.o. female who presents today for routine electrophysiology followup.  Since last being seen in our clinic, the patient reports doing very well.  Today, she denies symptoms of palpitations, chest pain, shortness of breath,  lower extremity edema, dizziness, presyncope, or syncope.  The patient is otherwise without complaint today.   Past Medical History:  Diagnosis Date  . Atrial flutter (HCC)    Ablated 1998 Dr. Caryl Comes  . Bleeding gastric ulcer    back in 2000  . Breast cancer ([place])   . COPD (chronic obstructive pulmonary disease) (HCC)    from radiation  . GERD (gastroesophageal reflux disease)   . Hodgkin's lymphoma ([place])    Treated with radiation and chemo  . Mitral regurgitation    pvc  . Osteoporosis    lumbar verterbral fracture, left ankle fracture, dexa 2015  . Pancreatitis   . Pneumonia   . Prediabetes   . 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 Ulcer    Past Surgical History:  Procedure Laterality Date  . BREAST SURGERY     breast cancer since 2005   left mastectomy  . CARDIAC ELECTROPHYSIOLOGY [place] AND ABLATION  1999  . CATARACT EXTRACTION, BILATERAL    . COLONOSCOPY    . IR RADIOLOGY PERIPHERAL GUIDED IV START  08/03/2017  . IR US GUIDE VASC ACCESS RIGHT  08/03/2017  . LAPAROTOMY    . MASTECTOMY  2005   Left-axillary  . MITRAL VALVE REPAIR Right 09/13/2017   Procedure: MINIMALLY INVASIVE MITRAL VALVE REPLACEMENT (MVR) with Medtronic Mosaic Porcine Heart Valve size 39mm;  Surgeon: Rexene Alberts, MD;  Location: [facility];  Service: Open Heart Surgery;  Laterality: Right;  . ORIF ANKLE FRACTURE Left 03/21/2014   Procedure: LEFT ANKLE FRACTURE OPEN TREATMENT BILMALLEOLAR INCLUDES INTERNAL FIXATION ;  Surgeon: Renette Butters, MD;   Location: [facility];  Service: Orthopedics;  Laterality: Left;  . PACEMAKER IMPLANT N/A 09/19/2017   Medtronic Azure XT MRI conditional dual-chamber pacemaker for symptomatic complete heart block  by Dr Rayann Heman  . RIGHT/LEFT HEART CATH AND CORONARY ANGIOGRAPHY N/A 06/27/2017   Procedure: Right/Left Heart Cath and Coronary Angiography;  Surgeon: Larey [name], MD;  Location: [facility];  Service: Cardiovascular;  Laterality: N/A;  . 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 TEE WITHOUT CARDIOVERSION  06/12/2012   Procedure: TRANSESOPHAGEAL ECHOCARDIOGRAM (TEE);  Surgeon: Larey [name], MD;  Location: [facility];  Service: Cardiovascular;  Laterality: N/A;  . TEE WITHOUT CARDIOVERSION N/A 06/29/2017   Procedure: TRANSESOPHAGEAL ECHOCARDIOGRAM (TEE);  Surgeon: Jerline Pain, MD;  Location: [facility];  Service: Cardiovascular;  Laterality: N/A;  . TEE WITHOUT CARDIOVERSION N/A 09/13/2017   Procedure: TRANSESOPHAGEAL ECHOCARDIOGRAM (TEE);  Surgeon: Rexene Alberts, MD;  Location: [facility];  Service: Open Heart Surgery;  Laterality: N/A;  . TONSILLECTOMY AND ADENOIDECTOMY      ROS- all systems are reviewed and negative except as per HPI above  Current Outpatient Medications  Medication Sig Dispense Refill  . acetaminophen (TYLENOL) 325 MG tablet Take 2 tablets (650 mg total) by mouth 2 (two) times daily as needed for moderate pain or headache.    Marland Kitchen aspirin EC 81 MG tablet Take 81 mg by mouth 3 (three) times a week.    Marland Kitchen  Calcium Carbonate-Vitamin D (CALCIUM PLUS VITAMIN D PO) Take 2 tablets by mouth daily.    . cetirizine (ZYRTEC) 10 MG tablet Take 10 mg by mouth daily as needed for allergies.    . chlorthalidone (HYGROTON) 25 MG tablet TAKE 1 TABLET BY MOUTH EVERY DAY 90 tablet 2  . doxycycline (VIBRA-TABS) 100 MG tablet Take 1 tablet (100 mg total) by mouth 2 (two) times daily. 20 tablet 0  . esomeprazole (NEXIUM) 40 MG capsule Take 40 mg by mouth daily before breakfast.    12  . furosemide (LASIX) 40 MG tablet TAKE 1 TABLET BY MOUTH EVERY DAY 30 tablet 7  . KLOR-CON M20 20 MEQ tablet TAKE 1 TABLET EVERY OTHER DAY. TAKE ADDITIONAL TABLET EVERY TIME YOU TAKE AN EXTRA FUROSEMIDE TABLET 90 tablet 0  . latanoprost (XALATAN) 0.005 % ophthalmic solution Place 1 drop into both eyes at bedtime.     Marland Kitchen levothyroxine (SYNTHROID, LEVOTHROID) 50 MCG tablet Take 1 tablet (50 mcg total) by mouth daily. 90 tablet 3  . lipase/protease/amylase (CREON) 12000 units CPEP capsule Take 12,000 Units by mouth 3 (three) times daily before meals.    . Multiple Vitamin (MULTIVITAMIN WITH MINERALS) TABS tablet Take 1 tablet by mouth daily. Centrum Silver.    Glory Rosebush DELICA LANCETS 99J MISC TEST SUGAR ONCE A WEEK 30 each 4  . ONETOUCH VERIO test strip CHECK SUGAR ONCE A WEEK 100 each 12  . metoprolol tartrate (LOPRESSOR) 100 MG tablet Take 1 tablet (100 mg total) by mouth 2 (two) times daily. 180 tablet 3   No current facility-administered medications for this visit.     Physical Exam: Vitals:   02/18/19 1413  BP: 128/76  Pulse: 91  SpO2: 99%  Weight: 125 lb 6.4 oz (56.9 kg)  Height: 5\' 6"  (1.676 m)    GEN- The patient is well appearing, alert and oriented x 3 today.   Head- normocephalic, atraumatic Eyes-  Sclera clear, conjunctiva pink Ears- hearing intact Oropharynx- clear Lungs- Clear to ausculation bilaterally, normal work of breathing Chest- pacemaker pocket is well healed Heart- Regular rate and rhythm, no murmurs, rubs or gallops, PMI not laterally displaced GI- soft, NT, ND, + BS Extremities- no clubbing, cyanosis, or edema  Pacemaker interrogation- reviewed in detail today,  See PACEART report  ekg tracing ordered today is personally reviewed and shows sinus with V pacing, PR 210 msec  Assessment and Plan:  1. Symptomatic complete heart block Normal pacemaker function See Pace Art report No changes today she is device dependant today  2. Paroxysmal atrial  fibrillatoin No afib in at least 2 years  3. S/p MVR Followed by Dr Allyn Kenner Return to see EP NP in a year  Thompson Grayer MD, [facility] 02/18/2019 2:36 PM

## 2019-02-19 LAB — CUP PACEART INCLINIC DEVICE CHECK
Battery Remaining Longevity: 136 mo
Battery Voltage: 3.02 V
Brady Statistic AP VP Percent: 0.01 %
Brady Statistic AP VS Percent: 0 %
Brady Statistic AS VP Percent: 99.93 %
Brady Statistic AS VS Percent: 0.06 %
Brady Statistic RA Percent Paced: 0.01 %
Brady Statistic RV Percent Paced: 99.94 %
Date Time Interrogation Session: 20200309181518
Implantable Lead Implant Date: 20181009
Implantable Lead Implant Date: 20181009
Implantable Lead Location: 753859
Implantable Lead Location: 753860
Implantable Lead Model: 5076
Implantable Lead Model: 5076
Implantable Pulse Generator Implant Date: 20181009
Lead Channel Impedance Value: 285 Ohm
Lead Channel Impedance Value: 380 Ohm
Lead Channel Impedance Value: 475 Ohm
Lead Channel Impedance Value: 570 Ohm
Lead Channel Pacing Threshold Amplitude: 0.75 V
Lead Channel Pacing Threshold Amplitude: 1 V
Lead Channel Pacing Threshold Pulse Width: 0.4 ms
Lead Channel Pacing Threshold Pulse Width: 0.4 ms
Lead Channel Sensing Intrinsic Amplitude: 2 mV
Lead Channel Sensing Intrinsic Amplitude: 2.375 mV
Lead Channel Sensing Intrinsic Amplitude: 3.625 mV
Lead Channel Setting Pacing Amplitude: 1.5 V
Lead Channel Setting Pacing Amplitude: 2 V
Lead Channel Setting Pacing Pulse Width: 0.4 ms
Lead Channel Setting Sensing Sensitivity: 2 mV

## 2019-02-22 ENCOUNTER — Encounter: Payer: Medicare Other | Admitting: Internal Medicine

## 2019-03-04 ENCOUNTER — Other Ambulatory Visit: Payer: Self-pay

## 2019-03-04 MED ORDER — METOPROLOL TARTRATE 100 MG PO TABS: 100.0000 mg | ORAL_TABLET | Freq: Two times a day (BID) | ORAL | 1 refills | Status: DC

## 2019-03-28 ENCOUNTER — Ambulatory Visit (INDEPENDENT_AMBULATORY_CARE_PROVIDER_SITE_OTHER): Payer: Medicare Other | Admitting: *Deleted

## 2019-03-28 ENCOUNTER — Other Ambulatory Visit: Payer: Self-pay

## 2019-03-28 DIAGNOSIS — I442 Atrioventricular block, complete: Secondary | ICD-10-CM

## 2019-03-28 LAB — CUP PACEART REMOTE DEVICE CHECK
Battery Remaining Longevity: 134 mo
Battery Voltage: 3.02 V
Brady Statistic AP VP Percent: 0.01 %
Brady Statistic AP VS Percent: 0 %
Brady Statistic AS VP Percent: 99.99 %
Brady Statistic AS VS Percent: 0.01 %
Brady Statistic RA Percent Paced: 0.01 %
Brady Statistic RV Percent Paced: 99.99 %
Date Time Interrogation Session: 20200416115644
Implantable Lead Implant Date: 20181009
Implantable Lead Implant Date: 20181009
Implantable Lead Location: 753859
Implantable Lead Location: 753860
Implantable Lead Model: 5076
Implantable Lead Model: 5076
Implantable Pulse Generator Implant Date: 20181009
Lead Channel Impedance Value: 247 Ohm
Lead Channel Impedance Value: 361 Ohm
Lead Channel Impedance Value: 437 Ohm
Lead Channel Impedance Value: 551 Ohm
Lead Channel Pacing Threshold Amplitude: 0.875 V
Lead Channel Pacing Threshold Amplitude: 1 V
Lead Channel Pacing Threshold Pulse Width: 0.4 ms
Lead Channel Pacing Threshold Pulse Width: 0.4 ms
Lead Channel Sensing Intrinsic Amplitude: 2.875 mV
Lead Channel Sensing Intrinsic Amplitude: 3.625 mV
Lead Channel Setting Pacing Amplitude: 2 V
Lead Channel Setting Pacing Amplitude: 2 V
Lead Channel Setting Pacing Pulse Width: 0.4 ms
Lead Channel Setting Sensing Sensitivity: 2 mV

## 2019-04-01 ENCOUNTER — Other Ambulatory Visit: Payer: Self-pay | Admitting: Cardiology

## 2019-04-01 MED ORDER — POTASSIUM CHLORIDE CRYS ER 20 MEQ PO TBCR: 20.0000 meq | EXTENDED_RELEASE_TABLET | Freq: Every day | ORAL | 0 refills | Status: DC

## 2019-04-01 NOTE — Telephone Encounter (Signed)
klor-con 20 Meq refilled.

## 2019-04-01 NOTE — Telephone Encounter (Signed)
New message    *STAT* If patient is at the pharmacy, call can be transferred to refill team.   1. Which medications need to be refilled? (please list name of each medication and dose if known)KLOR-CON M20 20 MEQ tablet  2. Which pharmacy/location (including street and city if local pharmacy) is medication to be sent to?[id number] [place], [place] - [address]  3. Do they need a 30 day or 90 day supply? 90, Patient states that this prescription needs to be changed to taking every day.

## 2019-04-03 ENCOUNTER — Encounter: Payer: Self-pay | Admitting: Cardiology

## 2019-04-03 NOTE — Progress Notes (Signed)
Remote pacemaker transmission.   

## 2019-05-24 ENCOUNTER — Other Ambulatory Visit: Payer: Self-pay | Admitting: Family Medicine

## 2019-05-24 ENCOUNTER — Telehealth: Payer: Self-pay | Admitting: Family Medicine

## 2019-05-24 MED ORDER — PREDNISONE 20 MG PO TABS: ORAL_TABLET | ORAL | 0 refills | Status: DC

## 2019-05-24 NOTE — Telephone Encounter (Signed)
Pt called and states that she has poison oak on her face moving to her eye and would like to know what she can use on it or if we can call her in something.  Per Dr. Dennard Schaumann  - ok to use prednisone taper pack.  Med sent to pharm and pt aware

## 2019-06-05 DIAGNOSIS — C4491 Basal cell carcinoma of skin, unspecified: Secondary | ICD-10-CM

## 2019-06-05 HISTORY — DX: Basal cell carcinoma of skin, unspecified: C44.91

## 2019-06-07 LAB — HM MAMMOGRAPHY

## 2019-06-13 ENCOUNTER — Encounter: Payer: Self-pay | Admitting: Family Medicine

## 2019-06-24 ENCOUNTER — Other Ambulatory Visit: Payer: Self-pay | Admitting: Cardiology

## 2019-06-27 ENCOUNTER — Ambulatory Visit (INDEPENDENT_AMBULATORY_CARE_PROVIDER_SITE_OTHER): Payer: Medicare Other | Admitting: *Deleted

## 2019-06-27 DIAGNOSIS — I442 Atrioventricular block, complete: Secondary | ICD-10-CM

## 2019-06-27 LAB — CUP PACEART REMOTE DEVICE CHECK
Battery Remaining Longevity: 128 mo
Battery Voltage: 3.01 V
Brady Statistic AP VP Percent: 0.01 %
Brady Statistic AP VS Percent: 0 %
Brady Statistic AS VP Percent: 99.99 %
Brady Statistic AS VS Percent: 0.01 %
Brady Statistic RA Percent Paced: 0.01 %
Brady Statistic RV Percent Paced: 99.99 %
Date Time Interrogation Session: 20200716105606
Implantable Lead Implant Date: 20181009
Implantable Lead Implant Date: 20181009
Implantable Lead Location: 753859
Implantable Lead Location: 753860
Implantable Lead Model: 5076
Implantable Lead Model: 5076
Implantable Pulse Generator Implant Date: 20181009
Lead Channel Impedance Value: 285 Ohm
Lead Channel Impedance Value: 361 Ohm
Lead Channel Impedance Value: 456 Ohm
Lead Channel Impedance Value: 494 Ohm
Lead Channel Pacing Threshold Amplitude: 0.875 V
Lead Channel Pacing Threshold Amplitude: 0.875 V
Lead Channel Pacing Threshold Pulse Width: 0.4 ms
Lead Channel Pacing Threshold Pulse Width: 0.4 ms
Lead Channel Sensing Intrinsic Amplitude: 2.625 mV
Lead Channel Setting Pacing Amplitude: 1.75 V
Lead Channel Setting Pacing Amplitude: 2 V
Lead Channel Setting Pacing Pulse Width: 0.4 ms
Lead Channel Setting Sensing Sensitivity: 2 mV

## 2019-07-04 ENCOUNTER — Encounter: Payer: Self-pay | Admitting: Cardiology

## 2019-07-04 ENCOUNTER — Other Ambulatory Visit: Payer: Self-pay | Admitting: Cardiology

## 2019-07-04 NOTE — Progress Notes (Signed)
Remote pacemaker transmission.   

## 2019-08-08 ENCOUNTER — Other Ambulatory Visit: Payer: Self-pay | Admitting: Cardiology

## 2019-08-18 ENCOUNTER — Other Ambulatory Visit: Payer: Self-pay | Admitting: Cardiology

## 2019-09-11 ENCOUNTER — Other Ambulatory Visit: Payer: Self-pay | Admitting: Cardiology

## 2019-09-19 ENCOUNTER — Telehealth: Payer: Self-pay | Admitting: Cardiology

## 2019-09-19 ENCOUNTER — Other Ambulatory Visit: Payer: Self-pay | Admitting: *Deleted

## 2019-09-19 ENCOUNTER — Other Ambulatory Visit: Payer: Self-pay | Admitting: Cardiology

## 2019-09-19 MED ORDER — METOPROLOL TARTRATE 100 MG PO TABS: 100.0000 mg | ORAL_TABLET | Freq: Two times a day (BID) | ORAL | 0 refills | Status: DC

## 2019-09-19 NOTE — Telephone Encounter (Signed)
New Message   *STAT* If patient is at the pharmacy, call can be transferred to refill team.   1. Which medications need to be refilled? (please list name of each medication and dose if known) metoprolol tartrate (LOPRESSOR) 100 MG tablet  2. Which pharmacy/location (including street and city if local pharmacy) is medication to be sent to? [id number] [place], [place] - [address]  3. Do they need a 30 day or 90 day supply? 30 day

## 2019-09-19 NOTE — Telephone Encounter (Signed)
Pt overdue for 6 month f/u.  Please contact pt for future appointment. 

## 2019-09-20 ENCOUNTER — Other Ambulatory Visit: Payer: Self-pay | Admitting: Family Medicine

## 2019-09-26 ENCOUNTER — Ambulatory Visit (INDEPENDENT_AMBULATORY_CARE_PROVIDER_SITE_OTHER): Payer: Medicare Other | Admitting: *Deleted

## 2019-09-26 DIAGNOSIS — I483 Typical atrial flutter: Secondary | ICD-10-CM | POA: Diagnosis not present

## 2019-09-26 DIAGNOSIS — R55 Syncope and collapse: Secondary | ICD-10-CM

## 2019-09-26 LAB — CUP PACEART REMOTE DEVICE CHECK
Battery Remaining Longevity: 123 mo
Battery Voltage: 3.01 V
Brady Statistic AP VP Percent: 0.01 %
Brady Statistic AP VS Percent: 0 %
Brady Statistic AS VP Percent: 99.98 %
Brady Statistic AS VS Percent: 0.01 %
Brady Statistic RA Percent Paced: 0.01 %
Brady Statistic RV Percent Paced: 99.99 %
Date Time Interrogation Session: 20201015125908
Implantable Lead Implant Date: 20181009
Implantable Lead Implant Date: 20181009
Implantable Lead Location: 753859
Implantable Lead Location: 753860
Implantable Lead Model: 5076
Implantable Lead Model: 5076
Implantable Pulse Generator Implant Date: 20181009
Lead Channel Impedance Value: 247 Ohm
Lead Channel Impedance Value: 323 Ohm
Lead Channel Impedance Value: 418 Ohm
Lead Channel Impedance Value: 456 Ohm
Lead Channel Pacing Threshold Amplitude: 0.875 V
Lead Channel Pacing Threshold Amplitude: 1 V
Lead Channel Pacing Threshold Pulse Width: 0.4 ms
Lead Channel Pacing Threshold Pulse Width: 0.4 ms
Lead Channel Sensing Intrinsic Amplitude: 2.875 mV
Lead Channel Sensing Intrinsic Amplitude: 2.875 mV
Lead Channel Sensing Intrinsic Amplitude: 3.625 mV
Lead Channel Setting Pacing Amplitude: 2 V
Lead Channel Setting Pacing Amplitude: 2 V
Lead Channel Setting Pacing Pulse Width: 0.4 ms
Lead Channel Setting Sensing Sensitivity: 2 mV

## 2019-10-02 DIAGNOSIS — Z9889 Other specified postprocedural states: Secondary | ICD-10-CM | POA: Insufficient documentation

## 2019-10-02 NOTE — Progress Notes (Signed)
HPI The patient presents for evaluation of mitral regurgitation. Transthoracic echo in the past demonstrated moderate to severe mitral regurgitation with mitral valve prolapse and moderate stenosis . However, followup TEE suggested the MR to be moderate. Echo in Jan suggested severe stenosis with moderate  MR and she also had moderate AI.  She is now status post minimally invasive MVR with a bioprosthesis.  In Nov 2018.  She had CHB and required pacemaker placement.  She returns for followup.    She is done well since I last saw her.  She lives by herself since her husband died 2 years ago.  She has been doing a lot of canning and other activities.  She is physical with her household chores.  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  She denies any cardiovascular symptoms. The patient denies any new symptoms such as chest discomfort, neck or arm discomfort. There has been no new shortness of breath, PND or orthopnea. There have been no reported palpitations, presyncope or syncope   Allergies  Allergen Reactions  . Azithromycin Other (See Comments)    pancreatitis  . Penicillins Anaphylaxis, Hives, Swelling and Other (See Comments)    PATIENT HAS HAD A PCN REACTION WITH IMMEDIATE RASH, FACIAL/TONGUE/THROAT SWELLING, SOB, OR LIGHTHEADEDNESS WITH HYPOTENSION:  #  #  #  YES  #  #  #   HAS PT DEVELOPED SEVERE RASH INVOLVING MUCUS MEMBRANES or SKIN NECROSIS: #  #  #  YES  #  #  #  Has patient had a PCN reaction that required hospitalization: No Has patient had a PCN reaction occurring within the last 10 years: No   . Sulfa Antibiotics Hives and Rash  . Cheese Nausea And Vomiting    Current Outpatient Medications  Medication Sig Dispense Refill  . acetaminophen (TYLENOL) 325 MG tablet Take 2 tablets (650 mg total) by mouth 2 (two) times daily as needed for moderate pain or headache.    Marland Kitchen aspirin EC 81 MG tablet Take 81 mg by mouth 3 (three) times a week.    . Calcium  Carbonate-Vitamin D (CALCIUM PLUS VITAMIN D PO) Take 2 tablets by mouth daily.    . cetirizine (ZYRTEC) 10 MG tablet Take 10 mg by mouth daily as needed for allergies.    . chlorthalidone (HYGROTON) 25 MG tablet TAKE 1 TABLET BY MOUTH EVERY DAY 90 tablet 2  . esomeprazole (NEXIUM) 40 MG capsule Take 40 mg by mouth daily before breakfast.   12  . furosemide (LASIX) 40 MG tablet TAKE 1 TABLET BY MOUTH EVERY DAY 90 tablet 2  . latanoprost (XALATAN) 0.005 % ophthalmic solution Place 1 drop into both eyes at bedtime.     Marland Kitchen levothyroxine (SYNTHROID) 50 MCG tablet TAKE 1 TABLET BY MOUTH EVERY DAY 90 tablet 3  . lipase/protease/amylase (CREON) 12000 units CPEP capsule Take 12,000 Units by mouth 3 (three) times daily before meals.    . metoprolol tartrate (LOPRESSOR) 100 MG tablet Take 1 tablet (100 mg total) by mouth 2 (two) times daily. 60 tablet 0  . Multiple Vitamin (MULTIVITAMIN WITH MINERALS) TABS tablet Take 1 tablet by mouth daily. Centrum Silver.    Glory Rosebush DELICA LANCETS 99991111 MISC TEST SUGAR ONCE A WEEK 30 each 4  . ONETOUCH VERIO test strip USE AS DIRECTED TO CHECK BLOOD SUGAR ONCE A WEEK 100 strip 12  . potassium chloride SA (KLOR-CON M20) 20 MEQ tablet Take 1 tablet (20 mEq  total) by mouth daily. *NEEDS OFFICE VISIT FOR FURTHER REFILLS* 30 tablet 0   No current facility-administered medications for this visit.     Past Medical History:  Diagnosis Date  . Atrial flutter (HCC)    Ablated 1998 Dr. Caryl Comes  . Bleeding gastric ulcer    back in 2000  . Breast cancer ([place])   . COPD (chronic obstructive pulmonary disease) (HCC)    from radiation  . GERD (gastroesophageal reflux disease)   . Hodgkin's lymphoma ([place])    Treated with radiation and chemo  . Mitral regurgitation    pvc  . Osteoporosis    lumbar verterbral fracture, left ankle fracture, dexa 2015  . Pancreatitis   . Pneumonia   . Prediabetes   . 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  09/13/2017   25 mm Medtronic Mosaic porcine stented bioprosthetic tissue valve  . Ulcer     Past Surgical History:  Procedure Laterality Date  . BREAST SURGERY     breast cancer since 2005   left mastectomy  . CARDIAC ELECTROPHYSIOLOGY [place] AND ABLATION  1999  . CATARACT EXTRACTION, BILATERAL    . COLONOSCOPY    . IR RADIOLOGY PERIPHERAL GUIDED IV START  08/03/2017  . IR US GUIDE VASC ACCESS RIGHT  08/03/2017  . LAPAROTOMY    . MASTECTOMY  2005   Left-axillary  . MITRAL VALVE REPAIR Right 09/13/2017   Procedure: MINIMALLY INVASIVE MITRAL VALVE REPLACEMENT (MVR) with Medtronic Mosaic Porcine Heart Valve size 32mm;  Surgeon: Rexene Alberts, MD;  Location: [facility];  Service: Open Heart Surgery;  Laterality: Right;  . ORIF ANKLE FRACTURE Left 03/21/2014   Procedure: LEFT ANKLE FRACTURE OPEN TREATMENT BILMALLEOLAR INCLUDES INTERNAL FIXATION ;  Surgeon: Renette Butters, MD;  Location: [facility];  Service: Orthopedics;  Laterality: Left;  . PACEMAKER IMPLANT N/A 09/19/2017   Medtronic Azure XT MRI conditional dual-chamber pacemaker for symptomatic complete heart block  by Dr Rayann Heman  . RIGHT/LEFT HEART CATH AND CORONARY ANGIOGRAPHY N/A 06/27/2017   Procedure: Right/Left Heart Cath and Coronary Angiography;  Surgeon: Larey [name], MD;  Location: [facility];  Service: Cardiovascular;  Laterality: N/A;  . 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 TEE WITHOUT CARDIOVERSION  06/12/2012   Procedure: TRANSESOPHAGEAL ECHOCARDIOGRAM (TEE);  Surgeon: Larey [name], MD;  Location: [facility];  Service: Cardiovascular;  Laterality: N/A;  . TEE WITHOUT CARDIOVERSION N/A 06/29/2017   Procedure: TRANSESOPHAGEAL ECHOCARDIOGRAM (TEE);  Surgeon: Jerline Pain, MD;  Location: [facility]OSCOPY;  Service: Cardiovascular;  Laterality: N/A;  . TEE WITHOUT CARDIOVERSION N/A 09/13/2017   Procedure: TRANSESOPHAGEAL ECHOCARDIOGRAM (TEE);  Surgeon: Rexene Alberts, MD;  Location: [facility];  Service:  Open Heart Surgery;  Laterality: N/A;  . TONSILLECTOMY AND ADENOIDECTOMY       ROS:   As stated in the HPI and negative for all other systems.  PHYSICAL EXAM BP 123/69   Pulse 100   Temp (!) 97.2 F (36.2 C)   Ht 5\' 6"  (1.676 m)   Wt 130 lb 9.6 oz (59.2 kg)   SpO2 96%   BMI 21.08 kg/m   GENERAL:  Well appearing NECK:  No jugular venous distention, waveform within normal limits, carotid upstroke brisk and symmetric, no bruits, no thyromegaly LUNGS:  Clear to auscultation bilaterally CHEST:  Unremarkable HEART:  PMI not displaced or sustained,S1 and S2 within normal limits, no S3, no S4, no clicks, no rubs, 2/6 apical systolic  murmur radiating slightly at the aortic outflow tract, no diastolic murmurs murmurs ABD:  Flat, positive bowel sounds normal in frequency in pitch, no bruits, no rebound, no guarding, no midline pulsatile mass, no hepatomegaly, no splenomegaly EXT:  2 plus pulses throughout, no edema, no cyanosis no clubbing   EKG: 02/18/2019 sinus rhythm, rate 91, ventricular pacing 100% capture.  ASSESSMENT AND PLAN  MVR:  This was normal in Nov of last year.  I think her exam is unchanged.  Her heart rate is increased but it typically is.  No change in therapy.  She is getting get a fit bit so I can better understand her heart rate.  Is back at her device interrogation from last week and it looks normal.    AI:    This is a moderate.  By exam I would not suspect it to be unchanged.  I will follow this clinically and repeat an echo next year.   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  She has no evidence of RV dysfunction or right-sided failure or elevated pulmonary pressures that are clinically significant.  I will follow this clinically.  Check an echo next year.  FLUTTER:     She has had no recurrent flutter/fib.  She is off anticoagulation.  No change in therapy.  She has no evidence of recurrent flutter on the device interrogation recently.  HTN:   Blood pressures well  controlled.  No change in therapy.  CHB:  With PPM.  I did review this with his visit as above.

## 2019-10-03 ENCOUNTER — Other Ambulatory Visit: Payer: Self-pay

## 2019-10-03 ENCOUNTER — Encounter: Payer: Self-pay | Admitting: Cardiology

## 2019-10-03 ENCOUNTER — Ambulatory Visit: Payer: Medicare Other | Admitting: Cardiology

## 2019-10-03 VITALS — BP 123/69 | HR 100 | Temp 97.2°F | Ht 66.0 in | Wt 130.6 lb

## 2019-10-03 DIAGNOSIS — Z9889 Other specified postprocedural states: Secondary | ICD-10-CM

## 2019-10-03 DIAGNOSIS — I272 Pulmonary hypertension, unspecified: Secondary | ICD-10-CM | POA: Diagnosis not present

## 2019-10-03 DIAGNOSIS — I351 Nonrheumatic aortic (valve) insufficiency: Secondary | ICD-10-CM | POA: Diagnosis not present

## 2019-10-03 DIAGNOSIS — I10 Essential (primary) hypertension: Secondary | ICD-10-CM | POA: Diagnosis not present

## 2019-10-03 NOTE — Patient Instructions (Signed)
Medication Instructions:  Your physician recommends that you continue on your current medications as directed. Please refer to the Current Medication list given to you today.  If you need a refill on your cardiac medications before your next appointment, please call your pharmacy.   Lab work: NONE  Testing/Procedures: NONE  Follow-Up: At [facility], you and your health needs are our priority.  As part of our continuing mission to provide you with exceptional heart care, we have created designated Provider Care Teams.  These Care Teams include your primary Cardiologist (physician) and Advanced Practice Providers (APPs -  Physician Assistants and Nurse Practitioners) who all work together to provide you with the care you need, when you need it. You may see Dr. Percival Spanish or one of the following Advanced Practice Providers on your designated Care Team:    Rosaria Ferries, PA-C  Jory Sims, DNP, ANP  Cadence Kathlen Mody, NP  Your physician wants you to follow-up in: 1 year. You will receive a reminder letter in the mail two months in advance. If you don't receive a letter, please call our office to schedule the follow-up appointment.

## 2019-10-10 NOTE — Progress Notes (Signed)
Remote pacemaker transmission.   

## 2019-10-13 ENCOUNTER — Other Ambulatory Visit: Payer: Self-pay | Admitting: Cardiology

## 2019-11-11 ENCOUNTER — Other Ambulatory Visit: Payer: Self-pay

## 2019-11-11 MED ORDER — METOPROLOL TARTRATE 100 MG PO TABS: 100.0000 mg | ORAL_TABLET | Freq: Two times a day (BID) | ORAL | 3 refills | Status: DC

## 2019-11-18 ENCOUNTER — Telehealth: Payer: Self-pay | Admitting: Internal Medicine

## 2019-11-18 NOTE — Telephone Encounter (Signed)
Follow up    Pt is calling back, she says she is about to leave for the day and asks that she can get a call back in the morning to get her phone set up for her device   Please call

## 2019-11-18 NOTE — Telephone Encounter (Signed)
New Message  Patient states that she has gotten a new phone and needs assistance converting her device from her old phone to her new phone. Please assist.

## 2019-11-19 NOTE — Telephone Encounter (Signed)
The pt needed to know her username and password for the app. I told her we would not know her username and password. I gave her the number to [place] support for additional help.

## 2019-11-28 ENCOUNTER — Other Ambulatory Visit: Payer: Self-pay | Admitting: Cardiology

## 2019-12-16 ENCOUNTER — Other Ambulatory Visit: Payer: Self-pay

## 2019-12-16 ENCOUNTER — Other Ambulatory Visit: Payer: Medicare Other

## 2019-12-16 DIAGNOSIS — Z0000 Encounter for general adult medical examination without abnormal findings: Secondary | ICD-10-CM

## 2019-12-17 LAB — COMPREHENSIVE METABOLIC PANEL
AG Ratio: 1.9 (calc) (ref 1.0–2.5)
ALT: 22 U/L (ref 6–29)
AST: 25 U/L (ref 10–35)
Albumin: 4.8 g/dL (ref 3.6–5.1)
Alkaline phosphatase (APISO): 75 U/L (ref 37–153)
BUN/Creatinine Ratio: 28 (calc) — ABNORMAL HIGH (ref 6–22)
BUN: 35 mg/dL — ABNORMAL HIGH (ref 7–25)
CO2: 34 mmol/L — ABNORMAL HIGH (ref 20–32)
Calcium: 11.1 mg/dL — ABNORMAL HIGH (ref 8.6–10.4)
Chloride: 95 mmol/L — ABNORMAL LOW (ref 98–110)
Creat: 1.24 mg/dL — ABNORMAL HIGH (ref 0.50–0.99)
Globulin: 2.5 g/dL (calc) (ref 1.9–3.7)
Glucose, Bld: 120 mg/dL — ABNORMAL HIGH (ref 65–99)
Potassium: 4.7 mmol/L (ref 3.5–5.3)
Sodium: 140 mmol/L (ref 135–146)
Total Bilirubin: 0.6 mg/dL (ref 0.2–1.2)
Total Protein: 7.3 g/dL (ref 6.1–8.1)

## 2019-12-17 LAB — LIPID PANEL
Cholesterol: 198 mg/dL (ref <200)
HDL: 65 mg/dL (ref >=50)
LDL Cholesterol (Calc): 111 mg/dL (calc) — ABNORMAL HIGH
Non-HDL Cholesterol (Calc): 133 mg/dL (calc) — ABNORMAL HIGH (ref <130)
Total CHOL/HDL Ratio: 3 (calc) (ref <5.0)
Triglycerides: 111 mg/dL (ref <150)

## 2019-12-17 LAB — CBC WITH DIFFERENTIAL/PLATELET
Absolute Monocytes: 1050 cells/uL — ABNORMAL HIGH (ref 200–950)
Basophils Absolute: 82 cells/uL (ref 0–200)
Basophils Relative: 1 %
Eosinophils Absolute: 262 cells/uL (ref 15–500)
Eosinophils Relative: 3.2 %
HCT: 41.6 % (ref 35.0–45.0)
Hemoglobin: 14 g/dL (ref 11.7–15.5)
Lymphs Abs: 1779 cells/uL (ref 850–3900)
MCH: 29.5 pg (ref 27.0–33.0)
MCHC: 33.7 g/dL (ref 32.0–36.0)
MCV: 87.6 fL (ref 80.0–100.0)
MPV: 8.7 fL (ref 7.5–12.5)
Monocytes Relative: 12.8 %
Neutro Abs: 5027 cells/uL (ref 1500–7800)
Neutrophils Relative %: 61.3 %
Platelets: 468 10*3/uL — ABNORMAL HIGH (ref 140–400)
RBC: 4.75 10*6/uL (ref 3.80–5.10)
RDW: 13.4 % (ref 11.0–15.0)
Total Lymphocyte: 21.7 %
WBC: 8.2 10*3/uL (ref 3.8–10.8)

## 2019-12-17 LAB — TSH: TSH: 2.75 mIU/L (ref 0.40–4.50)

## 2019-12-19 ENCOUNTER — Encounter: Payer: Self-pay | Admitting: Family Medicine

## 2019-12-19 ENCOUNTER — Other Ambulatory Visit: Payer: Self-pay

## 2019-12-19 ENCOUNTER — Ambulatory Visit (INDEPENDENT_AMBULATORY_CARE_PROVIDER_SITE_OTHER): Payer: Medicare PPO | Admitting: Family Medicine

## 2019-12-19 VITALS — BP 130/60 | HR 90 | Temp 97.1°F | Resp 14 | Ht 66.0 in | Wt 126.0 lb

## 2019-12-19 DIAGNOSIS — Z780 Asymptomatic menopausal state: Secondary | ICD-10-CM | POA: Diagnosis not present

## 2019-12-19 DIAGNOSIS — E039 Hypothyroidism, unspecified: Secondary | ICD-10-CM

## 2019-12-19 DIAGNOSIS — Z9889 Other specified postprocedural states: Secondary | ICD-10-CM

## 2019-12-19 DIAGNOSIS — R7303 Prediabetes: Secondary | ICD-10-CM | POA: Diagnosis not present

## 2019-12-19 DIAGNOSIS — N1831 Chronic kidney disease, stage 3a: Secondary | ICD-10-CM

## 2019-12-19 DIAGNOSIS — Z0000 Encounter for general adult medical examination without abnormal findings: Secondary | ICD-10-CM | POA: Diagnosis not present

## 2019-12-19 NOTE — Progress Notes (Addendum)
Subjective:    Patient ID: Patricia [name], female    DOB: [birth year], 69 y.o.   MRN: [id number]   Patient is a 69 y/o WF whom I have known my entire life through church..  She is here today for complete physical exam..  She has a complicated past medical history. In the mid 70s she was diagnosed with Hodgkin's disease and underwent radiation therapy for treatment. She's been in remission since 1976. She also has a history of left-sided breast cancer status post mastectomy and chemotherapy in 2005. She also has a history of mitral valve regurgitation with atrial flutter status post ablation. She still gets occasional PVCs and for that reason she takes metoprolol along with a baby aspirin. She also has a history of osteoporosis. Last bone density was in 2015.  She is overdue for a bone density test.  Last colonoscopy was in 2017.  She is due again in 2022 due to a history of adenomatous polyps.  Patient's mammogram is up-to-date and was performed in June.  She had a mitral valve replacement in October 2018 with a bioprosthetic valve.  She takes Lasix for fluid retention after that.  Recently we discontinued chlorthalidone as well as her calcium supplement due to a mildly elevated serum calcium level.  Blood work was also significant for an elevated fasting blood sugar of 120.  She has a history of prediabetes but she states that recently her sugars have been as high as 147.  She checks them every morning.  She is extremely healthy and eats a very low carbohydrate diet.  She is certainly not overweight. Immunization History  Administered Date(s) Administered  . Influenza Split 09/15/2015  . Influenza, High Dose Seasonal PF 09/16/2017, 08/02/2019  . Influenza,inj,Quad PF,6+ Mos 09/15/2016  . Influenza-Unspecified 08/03/2018  . Pneumococcal Conjugate-13 07/07/2016  . Pneumococcal Polysaccharide-23 01/23/2018  . Zoster Recombinat (Shingrix) 08/03/2018, 12/17/2018     Past Medical History:   Diagnosis Date  . Atrial flutter (HCC)    Ablated 1998 Dr. Caryl Comes  . Bleeding gastric ulcer    back in 2000  . Breast cancer ([place])   . COPD (chronic obstructive pulmonary disease) (HCC)    from radiation  . GERD (gastroesophageal reflux disease)   . Hodgkin's lymphoma ([place])    Treated with radiation and chemo  . Mitral regurgitation    pvc  . Osteoporosis    lumbar verterbral fracture, left ankle fracture, dexa 2015  . Pancreatitis   . Pneumonia   . Prediabetes   . 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 Ulcer    Past Surgical History:  Procedure Laterality Date  . BREAST SURGERY     breast cancer since 2005   left mastectomy  . CARDIAC ELECTROPHYSIOLOGY [place] AND ABLATION  1999  . CATARACT EXTRACTION, BILATERAL    . COLONOSCOPY    . IR RADIOLOGY PERIPHERAL GUIDED IV START  08/03/2017  . IR US GUIDE VASC ACCESS RIGHT  08/03/2017  . LAPAROTOMY    . MASTECTOMY  2005   Left-axillary  . MITRAL VALVE REPAIR Right 09/13/2017   Procedure: MINIMALLY INVASIVE MITRAL VALVE REPLACEMENT (MVR) with Medtronic Mosaic Porcine Heart Valve size 76mm;  Surgeon: Rexene Alberts, MD;  Location: [facility];  Service: Open Heart Surgery;  Laterality: Right;  . ORIF ANKLE FRACTURE Left 03/21/2014   Procedure: LEFT ANKLE FRACTURE OPEN TREATMENT BILMALLEOLAR INCLUDES INTERNAL FIXATION ;  Surgeon: Renette Butters, MD;  Location: [facility];  Service: Orthopedics;  Laterality: Left;  . PACEMAKER IMPLANT N/A 09/19/2017   Medtronic Azure XT MRI conditional dual-chamber pacemaker for symptomatic complete heart block  by Dr Rayann Heman  . RIGHT/LEFT HEART CATH AND CORONARY ANGIOGRAPHY N/A 06/27/2017   Procedure: Right/Left Heart Cath and Coronary Angiography;  Surgeon: Larey [name], MD;  Location: [facility];  Service: Cardiovascular;  Laterality: N/A;  . SPLENECTOMY  1974    hodgkins disease  . TEE WITHOUT CARDIOVERSION  06/12/2012   Procedure: TRANSESOPHAGEAL ECHOCARDIOGRAM (TEE);  Surgeon: Larey [name], MD;  Location: [facility];  Service: Cardiovascular;  Laterality: N/A;  . TEE WITHOUT CARDIOVERSION N/A 06/29/2017   Procedure: TRANSESOPHAGEAL ECHOCARDIOGRAM (TEE);  Surgeon: Jerline Pain, MD;  [facility];  Service: Cardiovascular;  Laterality: N/A;  . TEE WITHOUT CARDIOVERSION N/A 09/13/2017   Procedure: TRANSESOPHAGEAL ECHOCARDIOGRAM (TEE);  Surgeon: Rexene Alberts, MD;  Location: [facility];  Service: Open Heart Surgery;  Laterality: N/A;  . TONSILLECTOMY AND ADENOIDECTOMY     Current Outpatient Medications on File Prior to Visit  Medication Sig Dispense Refill  . acetaminophen (TYLENOL) 325 MG tablet Take 2 tablets (650 mg total) by mouth 2 (two) times daily as needed for moderate pain or headache.    Marland Kitchen aspirin EC 81 MG tablet Take 81 mg by mouth 3 (three) times a week.    . cetirizine (ZYRTEC) 10 MG tablet Take 10 mg by mouth daily as needed for allergies.    Marland Kitchen esomeprazole (NEXIUM) 40 MG capsule Take 40 mg by mouth daily before breakfast.   12  . furosemide (LASIX) 40 MG tablet TAKE 1 TABLET BY MOUTH EVERY DAY 90 tablet 2  . latanoprost (XALATAN) 0.005 % ophthalmic solution Place 1 drop into both eyes at bedtime.     Marland Kitchen levothyroxine (SYNTHROID) 50 MCG tablet TAKE 1 TABLET BY MOUTH EVERY DAY 90 tablet 3  . lipase/protease/amylase (CREON) 12000 units CPEP capsule Take 12,000 Units by mouth 3 (three) times daily before meals.    . metoprolol tartrate (LOPRESSOR) 100 MG tablet TAKE 1 TABLET BY MOUTH TWICE A DAY 180 tablet 1  . Multiple Vitamin (MULTIVITAMIN WITH MINERALS) TABS tablet Take 1 tablet by mouth daily. Centrum Silver.    Glory Rosebush DELICA LANCETS [id number] MISC TEST SUGAR ONCE A WEEK 30 each 4  . ONETOUCH VERIO test strip USE AS DIRECTED TO CHECK BLOOD SUGAR ONCE A WEEK 100 strip 12  . potassium chloride SA (KLOR-CON M20) 20 MEQ tablet Take 1  tablet (20 mEq total) by mouth daily. 90 tablet 3  . Calcium Carbonate-Vitamin D (CALCIUM PLUS VITAMIN D PO) Take 2 tablets by mouth daily.    . chlorthalidone (HYGROTON) 25 MG tablet TAKE 1 TABLET BY MOUTH EVERY DAY (Patient not taking: Reported on 12/19/2019) 90 tablet 2   No current facility-administered medications on file prior to visit.   Allergies  Allergen Reactions  . Azithromycin Other (See Comments)    pancreatitis  . Penicillins Anaphylaxis, Hives, Swelling and Other (See Comments)    PATIENT HAS HAD A PCN REACTION WITH IMMEDIATE RASH, FACIAL/TONGUE/THROAT SWELLING, SOB, OR LIGHTHEADEDNESS WITH HYPOTENSION:  #  #  #  YES  #  #  #   HAS PT DEVELOPED SEVERE RASH INVOLVING MUCUS MEMBRANES or SKIN NECROSIS: #  #  #  YES  #  #  #  Has patient had a PCN reaction that required hospitalization: No  Has patient had a PCN reaction occurring within the last 10 years: No   . Sulfa Antibiotics Hives and Rash  . Cheese Nausea And Vomiting   Social History   Socioeconomic History  . Marital status: Widowed    Spouse name: Not on file  . Number of children: Not on file  . Years of education: Not on file  . Highest education level: Master's degree (e.g., MA, MS, MEng, MEd, MSW, MBA)  Occupational History  . Occupation: Pharmacist, hospital (retired)  Tobacco Use  . Smoking status: Never Smoker  . Smokeless tobacco: Never Used  Substance and Sexual Activity  . Alcohol use: No  . Drug use: No  . Sexual activity: Yes  Other Topics Concern  . Not on file  Social History Narrative   Husband passed away from Cancer in 09/04/17.   Social Determinants of Health   Financial Resource Strain:   . Difficulty of Paying Living Expenses: Not on file  Food Insecurity:   . Worried About Charity fundraiser in the Last Year: Not on file  . Ran Out of Food in the Last Year: Not on file  Transportation Needs:   . Lack of Transportation (Medical): Not on file  . Lack of Transportation (Non-Medical): Not on  file  Physical Activity:   . Days of Exercise per Week: Not on file  . Minutes of Exercise per Session: Not on file  Stress:   . Feeling of Stress : Not on file  Social Connections:   . Frequency of Communication with Friends and Family: Not on file  . Frequency of Social Gatherings with Friends and Family: Not on file  . Attends Religious Services: Not on file  . Active Member of Clubs or Organizations: Not on file  . Attends Archivist Meetings: Not on file  . Marital Status: Not on file  Intimate Partner Violence:   . Fear of Current or Ex-Partner: Not on file  . Emotionally Abused: Not on file  . Physically Abused: Not on file  . Sexually Abused: Not on file   Family History  Problem Relation Age of Onset  . Rheumatic fever Father        Died suddenly age 54  . Diabetes Father   . Cancer Father        colon  . Heart disease Father        rheumatic fever x 3  . Arthritis Maternal Grandmother   . Hypertension Maternal Grandmother   . Diabetes Paternal Grandmother   . Vision loss Paternal Grandmother        glaucoma      Review of Systems     Objective:   Physical Exam  Constitutional: She is oriented to person, place, and time. She appears well-developed and well-nourished. No distress.  HENT:  Head: Normocephalic and atraumatic.  Right Ear: External ear normal.  Left Ear: External ear normal.  Nose: Nose normal.  Mouth/Throat: Oropharynx is clear and moist. No oropharyngeal exudate.  Eyes: Pupils are equal, round, and reactive to light. Conjunctivae and EOM are normal. Right eye exhibits no discharge.  Neck: No JVD present. No thyromegaly present.  Cardiovascular: Normal rate, regular rhythm, normal heart sounds and intact distal pulses. Exam reveals no gallop and no friction rub.  No murmur heard. Pulmonary/Chest: Effort normal and breath sounds normal. No stridor. No respiratory distress. She has no wheezes. She has no rales. She exhibits no  tenderness.  Abdominal: Soft. Bowel sounds are normal.  She exhibits no distension and no mass. There is no abdominal tenderness. There is no rebound and no guarding.  Musculoskeletal:        General: No tenderness or edema. Normal range of motion.     Cervical back: Neck supple.  Lymphadenopathy:    She has no cervical adenopathy.  Neurological: She is alert and oriented to person, place, and time. She displays normal reflexes. No cranial nerve deficit. She exhibits normal muscle tone. Coordination normal.  Skin: Skin is warm. No rash noted. She is not diaphoretic. No erythema. No pallor.  Psychiatric: She has a normal mood and affect. Her behavior is normal. Judgment and thought content normal.  Vitals reviewed.         Assessment & Plan:   Postmenopausal estrogen deficiency - Plan: DG BONE DENSITY (DXA)  Prediabetes - Plan: Hemoglobin A1c  Hypothyroidism, unspecified type  Stage 3a chronic kidney disease  S/P MVR (mitral valve repair)  General medical exam  Patient's physical exam today is completely normal.  I have asked her to hold chlorthalidone and calcium and recheck a calcium level and the version of a BMP in 2 weeks.  If calcium levels are still elevated I will check a PTH.  Also check potassium as she is still taking a potassium supplement due to her furosemide.  Also add a hemoglobin A1c today and if greater than 6.5 likely institute therapy for diabetes.  Patient believes she may be taking a statin.  She will go home and check.  If not, due to her prediabetes, I would recommend a low-dose statin.  Immunizations are up-to-date.  Cancer screening is up-to-date.  Regular anticipatory guidance is provided.  I will schedule the patient for a bone density given her history of osteoporosis as she may benefit from Prolia injections.  Due to the patient's history of breast cancer and mastectomy, she medically requires mastectomy supplies.

## 2019-12-20 ENCOUNTER — Other Ambulatory Visit: Payer: Self-pay

## 2019-12-20 LAB — HEMOGLOBIN A1C
Hgb A1c MFr Bld: 6.1 % of total Hgb — ABNORMAL HIGH (ref <5.7)
Mean Plasma Glucose: 128 (calc)
eAG (mmol/L): 7.1 (calc)

## 2019-12-20 MED ORDER — ATORVASTATIN CALCIUM 10 MG PO TABS: 10.0000 mg | ORAL_TABLET | Freq: Every day | ORAL | 2 refills | Status: DC

## 2019-12-25 ENCOUNTER — Other Ambulatory Visit: Payer: Self-pay | Admitting: Family Medicine

## 2019-12-25 MED ORDER — ACCU-CHEK SOFT TOUCH LANCETS MISC: 3 refills | Status: DC

## 2019-12-25 MED ORDER — ACCU-CHEK SOFTCLIX LANCET DEV KIT: PACK | 0 refills | Status: AC

## 2019-12-25 MED ORDER — ACCU-CHEK GUIDE VI STRP: ORAL_STRIP | 3 refills | Status: DC

## 2019-12-25 MED ORDER — ACCU-CHEK GUIDE W/DEVICE KIT: 1.0000 [IU] | PACK | Freq: Every day | 0 refills | Status: AC

## 2019-12-26 ENCOUNTER — Ambulatory Visit (INDEPENDENT_AMBULATORY_CARE_PROVIDER_SITE_OTHER): Payer: Medicare PPO | Admitting: *Deleted

## 2019-12-26 DIAGNOSIS — Z950 Presence of cardiac pacemaker: Secondary | ICD-10-CM

## 2019-12-26 LAB — CUP PACEART REMOTE DEVICE CHECK
Battery Remaining Longevity: 121 mo
Battery Voltage: 3.01 V
Brady Statistic AP VP Percent: 0.01 %
Brady Statistic AP VS Percent: 0 %
Brady Statistic AS VP Percent: 99.97 %
Brady Statistic AS VS Percent: 0.02 %
Brady Statistic RA Percent Paced: 0.01 %
Brady Statistic RV Percent Paced: 99.98 %
Date Time Interrogation Session: 20210113233941
Implantable Lead Implant Date: 20181009
Implantable Lead Implant Date: 20181009
Implantable Lead Location: 753859
Implantable Lead Location: 753860
Implantable Lead Model: 5076
Implantable Lead Model: 5076
Implantable Pulse Generator Implant Date: 20181009
Lead Channel Impedance Value: 209 Ohm
Lead Channel Impedance Value: 285 Ohm
Lead Channel Impedance Value: 361 Ohm
Lead Channel Impedance Value: 475 Ohm
Lead Channel Pacing Threshold Amplitude: 0.875 V
Lead Channel Pacing Threshold Amplitude: 0.875 V
Lead Channel Pacing Threshold Pulse Width: 0.4 ms
Lead Channel Pacing Threshold Pulse Width: 0.4 ms
Lead Channel Sensing Intrinsic Amplitude: 2.875 mV
Lead Channel Sensing Intrinsic Amplitude: 2.875 mV
Lead Channel Sensing Intrinsic Amplitude: 3.625 mV
Lead Channel Setting Pacing Amplitude: 1.75 V
Lead Channel Setting Pacing Amplitude: 2 V
Lead Channel Setting Pacing Pulse Width: 0.4 ms
Lead Channel Setting Sensing Sensitivity: 2 mV

## 2019-12-27 NOTE — Progress Notes (Signed)
PPM remote 

## 2020-01-01 ENCOUNTER — Other Ambulatory Visit: Payer: Self-pay

## 2020-01-01 ENCOUNTER — Other Ambulatory Visit: Payer: Medicare PPO

## 2020-01-01 DIAGNOSIS — N1831 Chronic kidney disease, stage 3a: Secondary | ICD-10-CM

## 2020-01-02 ENCOUNTER — Ambulatory Visit: Payer: Medicare PPO | Attending: Internal Medicine

## 2020-01-02 DIAGNOSIS — Z23 Encounter for immunization: Secondary | ICD-10-CM

## 2020-01-02 LAB — COMPLETE METABOLIC PANEL WITH GFR
AG Ratio: 2.1 (calc) (ref 1.0–2.5)
ALT: 32 U/L — ABNORMAL HIGH (ref 6–29)
AST: 29 U/L (ref 10–35)
Albumin: 4.4 g/dL (ref 3.6–5.1)
Alkaline phosphatase (APISO): 87 U/L (ref 37–153)
BUN/Creatinine Ratio: 25 (calc) — ABNORMAL HIGH (ref 6–22)
BUN: 33 mg/dL — ABNORMAL HIGH (ref 7–25)
CO2: 29 mmol/L (ref 20–32)
Calcium: 9.5 mg/dL (ref 8.6–10.4)
Chloride: 96 mmol/L — ABNORMAL LOW (ref 98–110)
Creat: 1.31 mg/dL — ABNORMAL HIGH (ref 0.50–0.99)
GFR, Est African American: 48 mL/min/{1.73_m2} — ABNORMAL LOW (ref >=60)
GFR, Est Non African American: 41 mL/min/{1.73_m2} — ABNORMAL LOW (ref >=60)
Globulin: 2.1 g/dL (calc) (ref 1.9–3.7)
Glucose, Bld: 109 mg/dL — ABNORMAL HIGH (ref 65–99)
Potassium: 4.6 mmol/L (ref 3.5–5.3)
Sodium: 139 mmol/L (ref 135–146)
Total Bilirubin: 0.5 mg/dL (ref 0.2–1.2)
Total Protein: 6.5 g/dL (ref 6.1–8.1)

## 2020-01-02 NOTE — Progress Notes (Signed)
   Covid-19 Vaccination Clinic  Name:  Patricia [name]    MRN: [id number] DOB: [birth year]  01/02/2020  Patricia [name] was observed post Covid-19 immunization for 15 minutes without incidence. She was provided with Vaccine Information Sheet and instruction to access the V-Safe system.   Patricia [name] was instructed to call 911 with any severe reactions post vaccine: Marland Kitchen Difficulty breathing  . Swelling of your face and throat  . A fast heartbeat  . A bad rash all over your body  . Dizziness and weakness    Immunizations Administered    Name Date Dose VIS Date Route   Pfizer COVID-19 Vaccine 01/02/2020  2:58 PM 0.3 mL 11/22/2019 Intramuscular   Manufacturer: [place]   Lot: [id number]   [place]: [id number]

## 2020-01-13 ENCOUNTER — Other Ambulatory Visit: Payer: Self-pay | Admitting: Family Medicine

## 2020-01-24 ENCOUNTER — Ambulatory Visit: Payer: Medicare PPO | Attending: Internal Medicine

## 2020-01-24 DIAGNOSIS — Z23 Encounter for immunization: Secondary | ICD-10-CM

## 2020-01-24 NOTE — Progress Notes (Signed)
   Covid-19 Vaccination Clinic  Name:  Patricia [name]    MRN: [id number] DOB: 27-Mar-1951  01/24/2020  [name] was observed post Covid-19 immunization for 15 minutes without incidence. She was provided with Vaccine Information Sheet and instruction to access the V-Safe system.   [name] was instructed to call 911 with any severe reactions post vaccine: Marland Kitchen Difficulty breathing  . Swelling of your face and throat  . A fast heartbeat  . A bad rash all over your body  . Dizziness and weakness    Immunizations Administered    Name Date Dose VIS Date Route   Pfizer COVID-19 Vaccine 01/24/2020  8:36 AM 0.3 mL 11/22/2019 Intramuscular   Manufacturer: [place]   Lot: [id number]   [place]: [id number]

## 2020-03-02 ENCOUNTER — Other Ambulatory Visit: Payer: Self-pay

## 2020-03-02 ENCOUNTER — Encounter: Payer: Self-pay | Admitting: Internal Medicine

## 2020-03-02 ENCOUNTER — Ambulatory Visit: Payer: Medicare PPO | Admitting: Internal Medicine

## 2020-03-02 VITALS — BP 148/80 | HR 94 | Ht 66.0 in | Wt 132.0 lb

## 2020-03-02 DIAGNOSIS — Z950 Presence of cardiac pacemaker: Secondary | ICD-10-CM | POA: Diagnosis not present

## 2020-03-02 DIAGNOSIS — I480 Paroxysmal atrial fibrillation: Secondary | ICD-10-CM

## 2020-03-02 DIAGNOSIS — I442 Atrioventricular block, complete: Secondary | ICD-10-CM | POA: Diagnosis not present

## 2020-03-02 DIAGNOSIS — I119 Hypertensive heart disease without heart failure: Secondary | ICD-10-CM

## 2020-03-02 NOTE — Patient Instructions (Signed)
Medication Instructions:  Your physician recommends that you continue on your current medications as directed. Please refer to the Current Medication list given to you today.  *If you need a refill on your cardiac medications before your next appointment, please call your pharmacy*   Lab Work: None ordered If you have labs (blood work) drawn today and your tests are completely normal, you will receive your results only by: Marland Kitchen MyChart Message (if you have MyChart) OR . A paper copy in the mail If you have any lab test that is abnormal or we need to change your treatment, we will call you to review the results.   Testing/Procedures: None ordered   Follow-Up: Remote monitoring is used to monitor your Pacemaker of ICD from home. This monitoring reduces the number of office visits required to check your device to one time per year. It allows [place] to keep an eye on the functioning of your device to ensure it is working properly. You are scheduled for a device check from home on 03/26/2020. You may send your transmission at any time that day. If you have a wireless device, the transmission will be sent automatically. After your physician reviews your transmission, you will receive a postcard with your next transmission date.    At [facility], you and your health needs are our priority.  As part of our continuing mission to provide you with exceptional heart care, we have created designated Provider Care Teams.  These Care Teams include your primary Cardiologist (physician) and Advanced Practice Providers (APPs -  Physician Assistants and Nurse Practitioners) who all work together to provide you with the care you need, when you need it.  Your next appointment:   1 year(s)  The format for your next appointment:   In Person  Provider:   Chanetta Marshall, NP   Thank you for choosing [facility]!!    Other Instructions

## 2020-03-02 NOTE — Progress Notes (Signed)
PCP: Susy Frizzle, MD Primary Cardiologist: Dr Percival Spanish Primary EP:  Dr Rayann Heman  Patricia [name] is a 69 y.o. female who presents today for routine electrophysiology followup.  Since last being seen in our clinic, the patient reports doing very well.  Today, she denies symptoms of palpitations, chest pain, shortness of breath,  lower extremity edema, dizziness, presyncope, or syncope.  The patient is otherwise without complaint today.   Past Medical History:  Diagnosis Date  . Atrial flutter (HCC)    Ablated 1998 Dr. Caryl Comes  . Bleeding gastric ulcer    back in 2000  . Breast cancer ([place])   . COPD (chronic obstructive pulmonary disease) (HCC)    from radiation  . GERD (gastroesophageal reflux disease)   . Hodgkin's lymphoma ([place])    Treated with radiation and chemo  . Mitral regurgitation    pvc  . Osteoporosis    lumbar verterbral fracture, left ankle fracture, dexa 2015  . Pancreatitis   . Pneumonia   . Prediabetes   . 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 Ulcer    Past Surgical History:  Procedure Laterality Date  . BREAST SURGERY     breast cancer since 2005   left mastectomy  . CARDIAC ELECTROPHYSIOLOGY [place] AND ABLATION  1999  . CATARACT EXTRACTION, BILATERAL    . COLONOSCOPY    . IR RADIOLOGY PERIPHERAL GUIDED IV START  08/03/2017  . IR US GUIDE VASC ACCESS RIGHT  08/03/2017  . LAPAROTOMY    . MASTECTOMY  2005   Left-axillary  . MITRAL VALVE REPAIR Right 09/13/2017   Procedure: MINIMALLY INVASIVE MITRAL VALVE REPLACEMENT (MVR) with Medtronic Mosaic Porcine Heart Valve size 1m;  Surgeon: ORexene Alberts MD;  Location: [place]  Service: Open Heart Surgery;  Laterality: Right;  . ORIF ANKLE FRACTURE Left 03/21/2014   Procedure: LEFT ANKLE FRACTURE OPEN TREATMENT BILMALLEOLAR INCLUDES INTERNAL FIXATION ;  Surgeon: TRenette Butters MD;   [place]  Service: Orthopedics;  Laterality: Left;  . PACEMAKER IMPLANT N/A 09/19/2017   Medtronic Azure XT MRI conditional dual-chamber pacemaker for symptomatic complete heart block  by Dr ARayann Heman . RIGHT/LEFT HEART CATH AND CORONARY ANGIOGRAPHY N/A 06/27/2017   Procedure: Right/Left Heart Cath and Coronary Angiography;  Surgeon: MLarey [name] MD;  [facility];  Service: Cardiovascular;  Laterality: N/A;  . 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 TEE WITHOUT CARDIOVERSION  06/12/2012   Procedure: TRANSESOPHAGEAL ECHOCARDIOGRAM (TEE);  Surgeon: DLarey [name] MD;  [place]  Service: Cardiovascular;  Laterality: N/A;  . TEE WITHOUT CARDIOVERSION N/A 06/29/2017   Procedure: TRANSESOPHAGEAL ECHOCARDIOGRAM (TEE);  Surgeon: SJerline Pain MD;  [facility]  Service: Cardiovascular;  Laterality: N/A;  . TEE WITHOUT CARDIOVERSION N/A 09/13/2017   Procedure: TRANSESOPHAGEAL ECHOCARDIOGRAM (TEE);  Surgeon: ORexene Alberts MD;  [place] [facility]  Service: Open Heart Surgery;  Laterality: N/A;  . TONSILLECTOMY AND ADENOIDECTOMY      ROS- all systems are reviewed and negative except as per HPI above  Current Outpatient Medications  Medication Sig Dispense Refill  . acetaminophen (TYLENOL) 325 MG tablet Take 2 tablets (650 mg total) by mouth 2 (two) times daily as needed for moderate pain or headache.    .Marland Kitchenaspirin EC 81 MG tablet Take 81 mg by mouth 3 (three) times a week.    .Marland Kitchen  atorvastatin (LIPITOR) 10 MG tablet TAKE 1 TABLET BY MOUTH EVERY DAY 90 tablet 3  . Blood Glucose Monitoring Suppl (ACCU-CHEK GUIDE) w/Device KIT 1 Units by Does not apply route daily. 1 kit 0  . cetirizine (ZYRTEC) 10 MG tablet Take 10 mg by mouth daily as needed for allergies.    . chlorthalidone (HYGROTON) 25 MG tablet TAKE 1 TABLET BY MOUTH EVERY DAY 90 tablet 2  . esomeprazole (NEXIUM) 40 MG capsule Take 40 mg by mouth daily before breakfast.   12  .  furosemide (LASIX) 40 MG tablet TAKE 1 TABLET BY MOUTH EVERY DAY 90 tablet 2  . glucose blood (ACCU-CHEK GUIDE) test strip Check BS QAM 100 each 3  . Lancets (ACCU-CHEK SOFT TOUCH) lancets Check BS QAM 100 each 3  . Lancets Misc. (ACCU-CHEK SOFTCLIX LANCET DEV) KIT Check BS QAM 1 kit 0  . latanoprost (XALATAN) 0.005 % ophthalmic solution Place 1 drop into both eyes at bedtime.     Marland Kitchen levothyroxine (SYNTHROID) 50 MCG tablet TAKE 1 TABLET BY MOUTH EVERY DAY 90 tablet 3  . lipase/protease/amylase (CREON) 12000 units CPEP capsule Take 12,000 Units by mouth 3 (three) times daily before meals.    . metoprolol tartrate (LOPRESSOR) 100 MG tablet TAKE 1 TABLET BY MOUTH TWICE A DAY 180 tablet 1  . Multiple Vitamin (MULTIVITAMIN WITH MINERALS) TABS tablet Take 1 tablet by mouth daily. Centrum Silver.    . potassium chloride SA (KLOR-CON M20) 20 MEQ tablet Take 1 tablet (20 mEq total) by mouth daily. 90 tablet 3   No current facility-administered medications for this visit.    Physical Exam: Vitals:   03/02/20 1545  BP: (!) 148/80  Pulse: 94  SpO2: 98%  Weight: 132 lb (59.9 kg)  Height: '5\' 6"'$  (1.676 m)    GEN- The patient is well appearing, alert and oriented x 3 today.   Head- normocephalic, atraumatic Eyes-  Sclera clear, conjunctiva pink Ears- hearing intact Oropharynx- clear Lungs-   normal work of breathing Chest- pacemaker pocket is well healed Heart- Regular rate and rhythm  GI- soft  Extremities- no clubbing, cyanosis, or edema  Pacemaker interrogation- reviewed in detail today,  See PACEART report  ekg tracing ordered today is personally reviewed and shows sinus rhythm with first degree AV block  Assessment and Plan:  1. Symptomatic complete heart block Normal pacemaker function See Pace Art report We decreased atrial sensitivity from 0.3 to 0.45 due to far field R waves causing false mode switches she is device dependant today  2. Paroxysmal atrial fibrillation No afib in  3 years  Could consider [place] if afib burden increases chads2vasc score is 3.    3. S/p MVR Followed by Dr Percival Spanish  4. Hypertensive cardiovascular disease Stable No change required today  5. HL Continue atorvastatin  Return to see EP NP in a year  Thompson Grayer MD, [facility] 03/02/2020 3:54 PM

## 2020-03-10 LAB — CUP PACEART INCLINIC DEVICE CHECK
Battery Remaining Longevity: 99 mo
Battery Voltage: 3 V
Brady Statistic AP VP Percent: 0.01 %
Brady Statistic AP VS Percent: 0 %
Brady Statistic AS VP Percent: 99.98 %
Brady Statistic AS VS Percent: 0.01 %
Brady Statistic RA Percent Paced: 0.02 %
Brady Statistic RV Percent Paced: 99.99 %
Date Time Interrogation Session: 20210322155000
Implantable Lead Implant Date: 20181009
Implantable Lead Implant Date: 20181009
Implantable Lead Location: 753859
Implantable Lead Location: 753860
Implantable Lead Model: 5076
Implantable Lead Model: 5076
Implantable Pulse Generator Implant Date: 20181009
Lead Channel Impedance Value: 228 Ohm
Lead Channel Impedance Value: 285 Ohm
Lead Channel Impedance Value: 323 Ohm
Lead Channel Impedance Value: 437 Ohm
Lead Channel Pacing Threshold Amplitude: 0.75 V
Lead Channel Pacing Threshold Amplitude: 1 V
Lead Channel Pacing Threshold Pulse Width: 0.4 ms
Lead Channel Pacing Threshold Pulse Width: 0.4 ms
Lead Channel Sensing Intrinsic Amplitude: 2.25 mV
Lead Channel Setting Pacing Amplitude: 1.5 V
Lead Channel Setting Pacing Amplitude: 2.5 V
Lead Channel Setting Pacing Pulse Width: 0.4 ms
Lead Channel Setting Sensing Sensitivity: 2 mV

## 2020-03-16 ENCOUNTER — Ambulatory Visit
Admission: RE | Admit: 2020-03-16 | Discharge: 2020-03-16 | Disposition: A | Discharge status: Home / Self Care | Payer: Medicare PPO | Source: Ambulatory Visit | Attending: Family Medicine | Admitting: Family Medicine

## 2020-03-16 ENCOUNTER — Other Ambulatory Visit: Payer: Self-pay

## 2020-03-16 DIAGNOSIS — Z780 Asymptomatic menopausal state: Secondary | ICD-10-CM

## 2020-03-16 DIAGNOSIS — M810 Age-related osteoporosis without current pathological fracture: Secondary | ICD-10-CM | POA: Diagnosis not present

## 2020-03-16 DIAGNOSIS — M85832 Other specified disorders of bone density and structure, left forearm: Secondary | ICD-10-CM | POA: Diagnosis not present

## 2020-03-17 ENCOUNTER — Other Ambulatory Visit: Payer: Self-pay | Admitting: Family Medicine

## 2020-03-17 MED ORDER — ALENDRONATE SODIUM 70 MG PO TABS: 70.0000 mg | ORAL_TABLET | ORAL | 3 refills | Status: DC

## 2020-03-26 ENCOUNTER — Ambulatory Visit (INDEPENDENT_AMBULATORY_CARE_PROVIDER_SITE_OTHER): Payer: Medicare PPO | Admitting: *Deleted

## 2020-03-26 DIAGNOSIS — Z950 Presence of cardiac pacemaker: Secondary | ICD-10-CM

## 2020-03-26 LAB — CUP PACEART REMOTE DEVICE CHECK
Battery Remaining Longevity: 102 mo
Battery Voltage: 3 V
Brady Statistic AP VP Percent: 0.01 %
Brady Statistic AP VS Percent: 0 %
Brady Statistic AS VP Percent: 99.97 %
Brady Statistic AS VS Percent: 0.02 %
Brady Statistic RA Percent Paced: 0.01 %
Brady Statistic RV Percent Paced: 99.98 %
Date Time Interrogation Session: 20210415091802
Implantable Lead Implant Date: 20181009
Implantable Lead Implant Date: 20181009
Implantable Lead Location: 753859
Implantable Lead Location: 753860
Implantable Lead Model: 5076
Implantable Lead Model: 5076
Implantable Pulse Generator Implant Date: 20181009
Lead Channel Impedance Value: 285 Ohm
Lead Channel Impedance Value: 304 Ohm
Lead Channel Impedance Value: 399 Ohm
Lead Channel Impedance Value: 475 Ohm
Lead Channel Pacing Threshold Amplitude: 0.75 V
Lead Channel Pacing Threshold Amplitude: 0.875 V
Lead Channel Pacing Threshold Pulse Width: 0.4 ms
Lead Channel Pacing Threshold Pulse Width: 0.4 ms
Lead Channel Sensing Intrinsic Amplitude: 2.25 mV
Lead Channel Sensing Intrinsic Amplitude: 2.25 mV
Lead Channel Sensing Intrinsic Amplitude: 3.625 mV
Lead Channel Setting Pacing Amplitude: 1.5 V
Lead Channel Setting Pacing Amplitude: 2.5 V
Lead Channel Setting Pacing Pulse Width: 0.4 ms
Lead Channel Setting Sensing Sensitivity: 2 mV

## 2020-03-26 NOTE — Progress Notes (Signed)
PPM Remote  

## 2020-04-11 ENCOUNTER — Other Ambulatory Visit: Payer: Self-pay | Admitting: Cardiology

## 2020-04-13 NOTE — Telephone Encounter (Signed)
Rx has been sent to the pharmacy electronically. ° °

## 2020-05-04 ENCOUNTER — Other Ambulatory Visit: Payer: Self-pay | Admitting: Cardiology

## 2020-05-29 DIAGNOSIS — C50112 Malignant neoplasm of central portion of left female breast: Secondary | ICD-10-CM | POA: Diagnosis not present

## 2020-05-29 DIAGNOSIS — Z4432 Encounter for fitting and adjustment of external left breast prosthesis: Secondary | ICD-10-CM | POA: Diagnosis not present

## 2020-06-05 ENCOUNTER — Telehealth: Payer: Self-pay | Admitting: Family Medicine

## 2020-06-05 NOTE — Telephone Encounter (Signed)
Had [facility] fax form again to me.

## 2020-06-05 NOTE — Telephone Encounter (Signed)
CB# [place] hasn't received  fax for pt mastectomy order  Please fax order by today if not she will have pay out pocket . Fax  [phone] Lorra Hals

## 2020-06-12 ENCOUNTER — Encounter: Payer: Self-pay | Admitting: Family Medicine

## 2020-06-12 DIAGNOSIS — Z1231 Encounter for screening mammogram for malignant neoplasm of breast: Secondary | ICD-10-CM | POA: Diagnosis not present

## 2020-06-16 ENCOUNTER — Other Ambulatory Visit: Payer: Self-pay

## 2020-06-16 MED ORDER — LEVOTHYROXINE SODIUM 50 MCG PO TABS: 50.0000 ug | ORAL_TABLET | Freq: Every day | ORAL | 3 refills | Status: DC

## 2020-06-17 ENCOUNTER — Other Ambulatory Visit: Payer: Self-pay

## 2020-06-17 MED ORDER — METOPROLOL TARTRATE 100 MG PO TABS: 100.0000 mg | ORAL_TABLET | Freq: Two times a day (BID) | ORAL | 2 refills | Status: DC

## 2020-06-17 NOTE — Telephone Encounter (Signed)
Rx(s) sent to pharmacy electronically.  

## 2020-06-30 ENCOUNTER — Ambulatory Visit (INDEPENDENT_AMBULATORY_CARE_PROVIDER_SITE_OTHER): Payer: Medicare PPO | Admitting: *Deleted

## 2020-06-30 DIAGNOSIS — I442 Atrioventricular block, complete: Secondary | ICD-10-CM | POA: Diagnosis not present

## 2020-06-30 LAB — CUP PACEART REMOTE DEVICE CHECK
Battery Remaining Longevity: 100 mo
Battery Voltage: 2.99 V
Brady Statistic AP VP Percent: 0.01 %
Brady Statistic AP VS Percent: 0 %
Brady Statistic AS VP Percent: 99.98 %
Brady Statistic AS VS Percent: 0.02 %
Brady Statistic RA Percent Paced: 0.01 %
Brady Statistic RV Percent Paced: 99.98 %
Date Time Interrogation Session: 20210720071250
Implantable Lead Implant Date: 20181009
Implantable Lead Implant Date: 20181009
Implantable Lead Location: 753859
Implantable Lead Location: 753860
Implantable Lead Model: 5076
Implantable Lead Model: 5076
Implantable Pulse Generator Implant Date: 20181009
Lead Channel Impedance Value: 285 Ohm
Lead Channel Impedance Value: 342 Ohm
Lead Channel Impedance Value: 456 Ohm
Lead Channel Impedance Value: 494 Ohm
Lead Channel Pacing Threshold Amplitude: 0.875 V
Lead Channel Pacing Threshold Amplitude: 1 V
Lead Channel Pacing Threshold Pulse Width: 0.4 ms
Lead Channel Pacing Threshold Pulse Width: 0.4 ms
Lead Channel Sensing Intrinsic Amplitude: 3 mV
Lead Channel Sensing Intrinsic Amplitude: 3 mV
Lead Channel Sensing Intrinsic Amplitude: 3.625 mV
Lead Channel Setting Pacing Amplitude: 1.75 V
Lead Channel Setting Pacing Amplitude: 2.5 V
Lead Channel Setting Pacing Pulse Width: 0.4 ms
Lead Channel Setting Sensing Sensitivity: 2 mV

## 2020-07-02 NOTE — Progress Notes (Signed)
Remote pacemaker transmission.   

## 2020-07-08 ENCOUNTER — Other Ambulatory Visit: Payer: Self-pay

## 2020-07-08 MED ORDER — ACCU-CHEK SOFT TOUCH LANCETS MISC: 3 refills | Status: AC

## 2020-07-10 DIAGNOSIS — Z03818 Encounter for observation for suspected exposure to other biological agents ruled out: Secondary | ICD-10-CM | POA: Diagnosis not present

## 2020-07-10 DIAGNOSIS — Z1159 Encounter for screening for other viral diseases: Secondary | ICD-10-CM | POA: Diagnosis not present

## 2020-07-21 DIAGNOSIS — H5213 Myopia, bilateral: Secondary | ICD-10-CM | POA: Diagnosis not present

## 2020-07-21 DIAGNOSIS — H401131 Primary open-angle glaucoma, bilateral, mild stage: Secondary | ICD-10-CM | POA: Diagnosis not present

## 2020-08-02 ENCOUNTER — Other Ambulatory Visit: Payer: Self-pay | Admitting: Cardiology

## 2020-08-12 DIAGNOSIS — H401112 Primary open-angle glaucoma, right eye, moderate stage: Secondary | ICD-10-CM | POA: Diagnosis not present

## 2020-08-19 DIAGNOSIS — H401122 Primary open-angle glaucoma, left eye, moderate stage: Secondary | ICD-10-CM | POA: Diagnosis not present

## 2020-09-29 ENCOUNTER — Ambulatory Visit (INDEPENDENT_AMBULATORY_CARE_PROVIDER_SITE_OTHER): Payer: Medicare PPO

## 2020-09-29 DIAGNOSIS — I442 Atrioventricular block, complete: Secondary | ICD-10-CM

## 2020-09-29 LAB — CUP PACEART REMOTE DEVICE CHECK
Battery Remaining Longevity: 97 mo
Battery Voltage: 2.99 V
Brady Statistic AP VP Percent: 0.01 %
Brady Statistic AP VS Percent: 0 %
Brady Statistic AS VP Percent: 99.97 %
Brady Statistic AS VS Percent: 0.03 %
Brady Statistic RA Percent Paced: 0.01 %
Brady Statistic RV Percent Paced: 99.97 %
Date Time Interrogation Session: 20211018202100
Implantable Lead Implant Date: 20181009
Implantable Lead Implant Date: 20181009
Implantable Lead Location: 753859
Implantable Lead Location: 753860
Implantable Lead Model: 5076
Implantable Lead Model: 5076
Implantable Pulse Generator Implant Date: 20181009
Lead Channel Impedance Value: 285 Ohm
Lead Channel Impedance Value: 361 Ohm
Lead Channel Impedance Value: 418 Ohm
Lead Channel Impedance Value: 494 Ohm
Lead Channel Pacing Threshold Amplitude: 0.75 V
Lead Channel Pacing Threshold Amplitude: 0.875 V
Lead Channel Pacing Threshold Pulse Width: 0.4 ms
Lead Channel Pacing Threshold Pulse Width: 0.4 ms
Lead Channel Sensing Intrinsic Amplitude: 2.125 mV
Lead Channel Sensing Intrinsic Amplitude: 2.125 mV
Lead Channel Sensing Intrinsic Amplitude: 3.625 mV
Lead Channel Setting Pacing Amplitude: 1.75 V
Lead Channel Setting Pacing Amplitude: 2.5 V
Lead Channel Setting Pacing Pulse Width: 0.4 ms
Lead Channel Setting Sensing Sensitivity: 2 mV

## 2020-10-01 NOTE — Progress Notes (Signed)
Cardiology Office Note   Date:  10/02/2020   ID:  Patricia [name], DOB [birth year], MRN [id number]  PCP:  Susy Frizzle, MD  Cardiologist:   Minus Breeding, MD   Chief Complaint  Patient presents with  . Palpitations      History of Present Illness: Patricia [name] is a 69 y.o. 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  She has had atrial flutter ablated.   Since I last saw her she has done well.  She has had a large garden.  She cancer green beans and freezes were butter beans.  The patient denies any new symptoms such as chest discomfort, neck or arm discomfort. There has been no new shortness of breath, PND or orthopnea. There have been no reported presyncope or syncope.  She does have palpitations and feels like he rarely she has some fibrillation.  There are some bradycardia detections on her device but no sustained episodes of atrial fibrillation.    Past Medical History:  Diagnosis Date  . Atrial flutter (HCC)    Ablated 1998 Dr. Caryl Comes  . Bleeding gastric ulcer    back in 2000  . Breast cancer ([place])   . COPD (chronic obstructive pulmonary disease) (HCC)    from radiation  . GERD (gastroesophageal reflux disease)   . Hodgkin's lymphoma ([place])    Treated with radiation and chemo  . Mitral regurgitation    pvc  . Osteoporosis    lumbar verterbral fracture, left ankle fracture, dexa 2015  . Pancreatitis   . Pneumonia   . Prediabetes   . 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 Ulcer     Past Surgical History:  Procedure Laterality Date  . BREAST SURGERY     breast cancer since 2005   left mastectomy  . CARDIAC ELECTROPHYSIOLOGY [place] AND ABLATION  1999  . CATARACT EXTRACTION, BILATERAL    . COLONOSCOPY    . IR RADIOLOGY PERIPHERAL GUIDED IV START  08/03/2017  . IR US  GUIDE VASC ACCESS RIGHT  08/03/2017  . LAPAROTOMY    . MASTECTOMY  2005   Left-axillary  . MITRAL VALVE REPAIR Right 09/13/2017   Procedure: MINIMALLY INVASIVE MITRAL VALVE REPLACEMENT (MVR) with Medtronic Mosaic Porcine Heart Valve size 30m;  Surgeon: ORexene Alberts MD;  [facility]  Service: Open Heart Surgery;  Laterality: Right;  . ORIF ANKLE FRACTURE Left 03/21/2014   Procedure: LEFT ANKLE FRACTURE OPEN TREATMENT BILMALLEOLAR INCLUDES INTERNAL FIXATION ;  Surgeon: TRenette Butters MD;  [facility]  Service: Orthopedics;  Laterality: Left;  . PACEMAKER IMPLANT N/A 09/19/2017   Medtronic Azure XT MRI conditional dual-chamber pacemaker for symptomatic complete heart block  by Dr ARayann Heman . RIGHT/LEFT HEART CATH AND CORONARY ANGIOGRAPHY N/A 06/27/2017   Procedure: Right/Left Heart Cath and Coronary Angiography;  Surgeon: MLarey [name] MD;  [facility];  Service: Cardiovascular;  Laterality: N/A;  . 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 TEE WITHOUT CARDIOVERSION  06/12/2012   Procedure: TRANSESOPHAGEAL ECHOCARDIOGRAM (TEE);  Surgeon: DLarey [name] MD;  [facility]  Service: Cardiovascular;  Laterality: N/A;  . TEE WITHOUT CARDIOVERSION N/A 06/29/2017   Procedure: TRANSESOPHAGEAL ECHOCARDIOGRAM (TEE);  Surgeon: SJerline Pain MD;  [facility];  Service: Cardiovascular;  Laterality:  N/A;  . TEE WITHOUT CARDIOVERSION N/A 09/13/2017   Procedure: TRANSESOPHAGEAL ECHOCARDIOGRAM (TEE);  Surgeon: Rexene Alberts, MD;  Location: [facility];  Service: Open Heart Surgery;  Laterality: N/A;  . TONSILLECTOMY AND ADENOIDECTOMY       Current Outpatient Medications  Medication Sig Dispense Refill  . acetaminophen (TYLENOL) 325 MG tablet Take 2 tablets (650 mg total) by mouth 2 (two) times daily as needed for moderate pain or headache.    . alendronate (FOSAMAX) 70 MG tablet Take 1 tablet (70 mg total) by mouth every 7 (seven) days.  Take with a full glass of water on an empty stomach. 12 tablet 3  . atorvastatin (LIPITOR) 10 MG tablet TAKE 1 TABLET BY MOUTH EVERY DAY 90 tablet 3  . Blood Glucose Monitoring Suppl (ACCU-CHEK GUIDE) w/Device KIT 1 Units by Does not apply route daily. 1 kit 0  . cetirizine (ZYRTEC) 10 MG tablet Take 10 mg by mouth daily as needed for allergies.    . chlorthalidone (HYGROTON) 25 MG tablet TAKE 1 TABLET BY MOUTH EVERY DAY 90 tablet 2  . esomeprazole (NEXIUM) 40 MG capsule Take 40 mg by mouth daily before breakfast.   12  . furosemide (LASIX) 40 MG tablet TAKE 1 TABLET BY MOUTH EVERY DAY 90 tablet 2  . glucose blood (ACCU-CHEK GUIDE) test strip Check BS QAM 100 each 3  . KLOR-CON M20 20 MEQ tablet TAKE 1 TABLET BY MOUTH EVERY DAY 90 tablet 1  . Lancets (ACCU-CHEK SOFT TOUCH) lancets Check BS QAM 100 each 3  . Lancets Misc. (ACCU-CHEK SOFTCLIX LANCET DEV) KIT Check BS QAM 1 kit 0  . levothyroxine (SYNTHROID) 50 MCG tablet Take 1 tablet (50 mcg total) by mouth daily. 90 tablet 3  . lipase/protease/amylase (CREON) 12000 units CPEP capsule Take 12,000 Units by mouth 3 (three) times daily before meals.    . metoprolol tartrate (LOPRESSOR) 100 MG tablet Take 1 tablet (100 mg total) by mouth 2 (two) times daily. 180 tablet 2  . Multiple Vitamin (MULTIVITAMIN WITH MINERALS) TABS tablet Take 1 tablet by mouth daily. Centrum Silver.     No current facility-administered medications for this visit.    Allergies:   Azithromycin, Penicillins, Sulfa antibiotics, and Cheese    Social History:  The patient  reports that she has never smoked. She has never used smokeless tobacco. She reports that she does not drink alcohol and does not use drugs.   Family History:  The patient's family history includes Arthritis in her maternal grandmother; Cancer in her father; Diabetes in her father and paternal grandmother; Heart disease in her father; Hypertension in her maternal grandmother; Rheumatic fever in her father;  Vision loss in her paternal grandmother.    ROS:  Please see the history of present illness.   Otherwise, review of systems are positive for none.   All other systems are reviewed and negative.    PHYSICAL EXAM: VS:  BP (!) 146/68   Pulse 90   Ht _0  (1.651 m)   Wt 125 lb 9.6 oz (57 kg)   SpO2 97%   BMI 20.90 kg/m  , BMI Body mass index is 20.9 kg/m. GENERAL:  Well appearing NECK:  No jugular venous distention, waveform within normal limits, carotid upstroke brisk and symmetric, no bruits, no thyromegaly LUNGS:  Clear to auscultation bilaterally CHEST:  Unremarkable HEART:  PMI not displaced or sustained,S1 and S2 within normal limits, no S3, no S4, no clicks, no rubs, 2 out of  6 brief apical systolic murmur nonradiating, no diastolic murmurs ABD:  Flat, positive bowel sounds normal in frequency in pitch, no bruits, no rebound, no guarding, no midline pulsatile mass, no hepatomegaly, no splenomegaly EXT:  2 plus pulses throughout, no edema, no cyanosis no clubbing    EKG:  EKG is ordered today. The ekg ordered today demonstrates sinus rhythm with ventricular pacing rate 90   Recent Labs: 12/16/2019: Hemoglobin 14.0; Platelets 468; TSH 2.75 01/01/2020: ALT 32; BUN 33; Creat 1.31; Potassium 4.6; Sodium 139    Lipid Panel    Component Value Date/Time   CHOL 198 12/16/2019 0812   TRIG 111 12/16/2019 0812   HDL 65 12/16/2019 0812   CHOLHDL 3.0 12/16/2019 0812   VLDL 31 (H) 07/07/2016 0847   LDLCALC 111 (H) 12/16/2019 0812      Wt Readings from Last 3 Encounters:  10/02/20 125 lb 9.6 oz (57 kg)  03/02/20 132 lb (59.9 kg)  12/19/19 126 lb (57.2 kg)      Other studies Reviewed: Additional studies/ records that were reviewed today include: Labs. Review of the above records demonstrates:  Please see elsewhere in the note.     ASSESSMENT AND PLAN:  MVR:   She had a stable valve replacement in Nov 2019.  She has moderate AI and pulmonary HTN.   I am going to repeat an  echocardiogram this year.  She understands endocarditis prophylaxis.   AI:    This is a moderate.    This will be followed as above.  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   She has no physical findings consistent with significant pulmonary hypertension and no symptoms.  FLUTTER:     She has had no recurrent symptomatic sustained flutter/fib.    I did review the interrogation and Dr. Jackalyn Lombard recent note.  He reports there is no documented fibrillation in the last few years.  She might be having very brief episodes but according to current data I do not think without sustained documented increased burden of fibrillation that DOAC is indicated and she and I had this discussion.   HTN:   Blood pressures is mildly elevated but she has had this is very unusual.  In the 829H systolic at home.  No change in therapy.   CHB:  She has had normal pacemaker function.  I reviewed the 09/28/20 report.  As above   Current medicines are reviewed at length with the patient today.  The patient does not have concerns regarding medicines.  The following changes have been made:  no change  Labs/ tests ordered today include:   Orders Placed This Encounter  Procedures  . EKG 12-Lead  . ECHOCARDIOGRAM COMPLETE     Disposition:   FU with me in one year     Signed, Minus Breeding, MD  10/02/2020 9:07 AM    [place]

## 2020-10-02 ENCOUNTER — Other Ambulatory Visit: Payer: Self-pay

## 2020-10-02 ENCOUNTER — Encounter: Payer: Self-pay | Admitting: Cardiology

## 2020-10-02 ENCOUNTER — Ambulatory Visit: Payer: Medicare PPO | Admitting: Cardiology

## 2020-10-02 VITALS — BP 146/68 | HR 90 | Ht 65.0 in | Wt 125.6 lb

## 2020-10-02 DIAGNOSIS — I484 Atypical atrial flutter: Secondary | ICD-10-CM

## 2020-10-02 DIAGNOSIS — I340 Nonrheumatic mitral (valve) insufficiency: Secondary | ICD-10-CM

## 2020-10-02 DIAGNOSIS — I272 Pulmonary hypertension, unspecified: Secondary | ICD-10-CM

## 2020-10-02 DIAGNOSIS — I351 Nonrheumatic aortic (valve) insufficiency: Secondary | ICD-10-CM | POA: Diagnosis not present

## 2020-10-02 NOTE — Patient Instructions (Signed)
Medication Instructions:  No changes *If you need a refill on your cardiac medications before your next appointment, please call your pharmacy*   Lab Work: None ordered If you have labs (blood work) drawn today and your tests are completely normal, you will receive your results only by:  [place] (if you have MyChart) OR  A paper copy in the mail If you have any lab test that is abnormal or we need to change your treatment, we will call you to review the results.   Testing/Procedures: Your physician has requested that you have an echocardiogram. Echocardiography is a painless test that uses sound waves to create images of your heart. It provides your doctor with information about the size and shape of your heart and how well your hearts chambers and valves are working. You may receive an ultrasound enhancing agent through an IV if needed to better visualize your heart during the echo.This procedure takes approximately one hour. There are no restrictions for this procedure. This will take place at the [address], [address].     Follow-Up: At [facility], you and your health needs are our priority.  As part of our continuing mission to provide you with exceptional heart care, we have created designated Provider Care Teams.  These Care Teams include your primary Cardiologist (physician) and Advanced Practice Providers (APPs -  Physician Assistants and Nurse Practitioners) who all work together to provide you with the care you need, when you need it.  We recommend signing up for the patient portal called "MyChart".  Sign up information is provided on this After Visit Summary.  MyChart is used to connect with patients for Virtual Visits (Telemedicine).  Patients are able to view lab/test results, encounter notes, upcoming appointments, etc.  Non-urgent messages can be sent to your provider as well.   To learn more about what you can do with MyChart, go to [email or web address].     Your next appointment:   12 month(s)  The format for your next appointment:   In Person  Provider:   You may see Minus Breeding, MD or one of the following Advanced Practice Providers on your designated Care Team:    Rosaria Ferries, PA-C  Jory Sims, DNP, ANP

## 2020-10-05 NOTE — Progress Notes (Signed)
Remote pacemaker transmission.   

## 2020-10-09 ENCOUNTER — Other Ambulatory Visit: Payer: Self-pay

## 2020-10-09 ENCOUNTER — Ambulatory Visit (HOSPITAL_COMMUNITY): Payer: Medicare PPO | Attending: Cardiology

## 2020-10-09 DIAGNOSIS — I340 Nonrheumatic mitral (valve) insufficiency: Secondary | ICD-10-CM | POA: Diagnosis not present

## 2020-10-09 DIAGNOSIS — I351 Nonrheumatic aortic (valve) insufficiency: Secondary | ICD-10-CM | POA: Insufficient documentation

## 2020-10-09 DIAGNOSIS — I272 Pulmonary hypertension, unspecified: Secondary | ICD-10-CM | POA: Diagnosis not present

## 2020-10-09 LAB — ECHOCARDIOGRAM COMPLETE
Area-P 1/2: 4.12 cm2
P 1/2 time: 317 msec
S' Lateral: 2.6 cm

## 2020-11-03 DIAGNOSIS — K8681 Exocrine pancreatic insufficiency: Secondary | ICD-10-CM | POA: Diagnosis not present

## 2020-11-03 DIAGNOSIS — Z86010 Personal history of colonic polyps: Secondary | ICD-10-CM | POA: Diagnosis not present

## 2020-11-03 DIAGNOSIS — Z800 Family history of malignant neoplasm of digestive organs: Secondary | ICD-10-CM | POA: Diagnosis not present

## 2020-11-03 DIAGNOSIS — K219 Gastro-esophageal reflux disease without esophagitis: Secondary | ICD-10-CM | POA: Diagnosis not present

## 2020-11-03 DIAGNOSIS — K863 Pseudocyst of pancreas: Secondary | ICD-10-CM | POA: Diagnosis not present

## 2020-11-23 ENCOUNTER — Ambulatory Visit (INDEPENDENT_AMBULATORY_CARE_PROVIDER_SITE_OTHER): Payer: Medicare PPO | Admitting: Family Medicine

## 2020-11-23 ENCOUNTER — Ambulatory Visit
Admission: RE | Admit: 2020-11-23 | Discharge: 2020-11-23 | Disposition: A | Discharge status: Home / Self Care | Payer: Medicare PPO | Source: Ambulatory Visit | Attending: Family Medicine | Admitting: Family Medicine

## 2020-11-23 ENCOUNTER — Other Ambulatory Visit: Payer: Self-pay

## 2020-11-23 VITALS — BP 130/68 | HR 92 | Temp 98.2°F | Ht 65.0 in | Wt 127.0 lb

## 2020-11-23 DIAGNOSIS — M545 Low back pain, unspecified: Secondary | ICD-10-CM | POA: Diagnosis not present

## 2020-11-23 MED ORDER — CYCLOBENZAPRINE HCL 10 MG PO TABS: 10.0000 mg | ORAL_TABLET | Freq: Three times a day (TID) | ORAL | 0 refills | Status: DC | PRN

## 2020-11-23 MED ORDER — PREDNISONE 20 MG PO TABS: ORAL_TABLET | ORAL | 0 refills | Status: DC

## 2020-11-23 NOTE — Progress Notes (Signed)
Subjective:    Patient ID: Patricia [name], female    DOB: 12/12/1951, 69 y.o.   MRN: [id number]  Patient is a very pleasant 69 year old Caucasian female who presents today with low back pain.  The pain is located in the right side of her lower back.  Roughly around the level of L2 or L3.  Is located lateral to the spinous process in the right side.  She denies any specific injury.  She does not remember the exact moment the pain start.  However the pain is intense.  The patient has a very high threshold for pain and normally does not come to the doctor unless she is really hurting.  She states that she is not having any paresthesias or hyperesthesias radiating down her leg.  She denies any saddle anesthesia.  She denies any bowel or bladder incontinence.  However she does report leg weakness.  She states that she feels like her legs are weak on both sides from her hips all the way down to her toes.  She has having a difficult time climbing steps.  The biggest weakness seems to be her hip extensor muscle as this exacerbates her pain when she steps up per tickly onto the exam table.  She denies any dysuria or hematuria or urgency or frequency.  By flexion and extension of the hip I am able to exacerbate the pain.  Internal and external rotation of the hip also exacerbates the pain however there is no pain in the anterior hip joint area.  The pain is located in the back leading me to believe that on stretching a pulled muscle.  Her reflexes are diminished on both sides however I believe this is due to pain and anticipation rather than to neurologic deficit. Past Medical History:  Diagnosis Date  . Atrial flutter (HCC)    Ablated 1998 Dr. Caryl Comes  . Bleeding gastric ulcer    back in 2000  . Breast cancer ([place])   . COPD (chronic obstructive pulmonary disease) (HCC)    from radiation  . GERD (gastroesophageal reflux disease)   . Hodgkin's lymphoma ([place])    Treated with radiation and chemo  . Mitral  regurgitation    pvc  . Osteoporosis    lumbar verterbral fracture, left ankle fracture, dexa 2015  . Pancreatitis   . Pneumonia   . Prediabetes   . 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 Ulcer    Past Surgical History:  Procedure Laterality Date  . BREAST SURGERY     breast cancer since 2005   left mastectomy  . CARDIAC ELECTROPHYSIOLOGY [place] AND ABLATION  1999  . CATARACT EXTRACTION, BILATERAL    . COLONOSCOPY    . IR RADIOLOGY PERIPHERAL GUIDED IV START  08/03/2017  . IR US GUIDE VASC ACCESS RIGHT  08/03/2017  . LAPAROTOMY    . MASTECTOMY  2005   Left-axillary  . MITRAL VALVE REPAIR Right 09/13/2017   Procedure: MINIMALLY INVASIVE MITRAL VALVE REPLACEMENT (MVR) with Medtronic Mosaic Porcine Heart Valve size 24m;  Surgeon: ORexene Alberts MD;  [facility]  Service: Open Heart Surgery;  Laterality: Right;  . ORIF ANKLE FRACTURE Left 03/21/2014   Procedure: LEFT ANKLE FRACTURE OPEN TREATMENT BILMALLEOLAR INCLUDES INTERNAL FIXATION ;  Surgeon: TRenette Butters MD;  [facility]  Service: Orthopedics;  Laterality: Left;  . PACEMAKER IMPLANT N/A 09/19/2017  Medtronic Azure XT MRI conditional dual-chamber pacemaker for symptomatic complete heart block  by Dr Rayann Heman  . RIGHT/LEFT HEART CATH AND CORONARY ANGIOGRAPHY N/A 06/27/2017   Procedure: Right/Left Heart Cath and Coronary Angiography;  Surgeon: Larey [name], MD;  Location: [facility];  Service: Cardiovascular;  Laterality: N/A;  . 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 TEE WITHOUT CARDIOVERSION  06/12/2012   Procedure: TRANSESOPHAGEAL ECHOCARDIOGRAM (TEE);  Surgeon: Larey [name], MD;  Location: [facility];  Service: Cardiovascular;  Laterality: N/A;  . TEE WITHOUT CARDIOVERSION N/A 06/29/2017   Procedure: TRANSESOPHAGEAL ECHOCARDIOGRAM (TEE);  Surgeon: Jerline Pain, MD;   Location: [facility];  Service: Cardiovascular;  Laterality: N/A;  . TEE WITHOUT CARDIOVERSION N/A 09/13/2017   Procedure: TRANSESOPHAGEAL ECHOCARDIOGRAM (TEE);  Surgeon: Rexene Alberts, MD;  Location: [facility];  Service: Open Heart Surgery;  Laterality: N/A;  . TONSILLECTOMY AND ADENOIDECTOMY     Current Outpatient Medications on File Prior to Visit  Medication Sig Dispense Refill  . acetaminophen (TYLENOL) 325 MG tablet Take 2 tablets (650 mg total) by mouth 2 (two) times daily as needed for moderate pain or headache.    . alendronate (FOSAMAX) 70 MG tablet Take 1 tablet (70 mg total) by mouth every 7 (seven) days. Take with a full glass of water on an empty stomach. 12 tablet 3  . atorvastatin (LIPITOR) 10 MG tablet TAKE 1 TABLET BY MOUTH EVERY DAY 90 tablet 3  . Blood Glucose Monitoring Suppl (ACCU-CHEK GUIDE) w/Device KIT 1 Units by Does not apply route daily. 1 kit 0  . cetirizine (ZYRTEC) 10 MG tablet Take 10 mg by mouth daily as needed for allergies.    . chlorthalidone (HYGROTON) 25 MG tablet TAKE 1 TABLET BY MOUTH EVERY DAY 90 tablet 2  . esomeprazole (NEXIUM) 40 MG capsule Take 40 mg by mouth daily before breakfast.   12  . furosemide (LASIX) 40 MG tablet TAKE 1 TABLET BY MOUTH EVERY DAY 90 tablet 2  . glucose blood (ACCU-CHEK GUIDE) test strip Check BS QAM 100 each 3  . KLOR-CON M20 20 MEQ tablet TAKE 1 TABLET BY MOUTH EVERY DAY 90 tablet 1  . Lancets (ACCU-CHEK SOFT TOUCH) lancets Check BS QAM 100 each 3  . Lancets Misc. (ACCU-CHEK SOFTCLIX LANCET DEV) KIT Check BS QAM 1 kit 0  . levothyroxine (SYNTHROID) 50 MCG tablet Take 1 tablet (50 mcg total) by mouth daily. 90 tablet 3  . lipase/protease/amylase (CREON) 12000 units CPEP capsule Take 12,000 Units by mouth 3 (three) times daily before meals.    . metoprolol tartrate (LOPRESSOR) 100 MG tablet Take 1 tablet (100 mg total) by mouth 2 (two) times daily. 180 tablet 2  . Multiple Vitamin (MULTIVITAMIN WITH MINERALS) TABS tablet Take  1 tablet by mouth daily. Centrum Silver.     No current facility-administered medications on file prior to visit.   Allergies  Allergen Reactions  . Azithromycin Other (See Comments)    pancreatitis  . Penicillins Anaphylaxis, Hives, Swelling and Other (See Comments)    PATIENT HAS HAD A PCN REACTION WITH IMMEDIATE RASH, FACIAL/TONGUE/THROAT SWELLING, SOB, OR LIGHTHEADEDNESS WITH HYPOTENSION:  #  #  #  YES  #  #  #   HAS PT DEVELOPED SEVERE RASH INVOLVING MUCUS MEMBRANES or SKIN NECROSIS: #  #  #  YES  #  #  #  Has patient had a PCN reaction that required hospitalization: No Has patient had a PCN reaction occurring  within the last 10 years: No   . Sulfa Antibiotics Hives and Rash  . Cheese Nausea And Vomiting   Social History   Socioeconomic History  . Marital status: Widowed    Spouse name: Not on file  . Number of children: Not on file  . Years of education: Not on file  . Highest education level: Master's degree (e.g., MA, MS, MEng, MEd, MSW, MBA)  Occupational History  . Occupation: Pharmacist, hospital (retired)  Tobacco Use  . Smoking status: Never Smoker  . Smokeless tobacco: Never Used  Vaping Use  . Vaping Use: Never used  Substance and Sexual Activity  . Alcohol use: No  . Drug use: No  . Sexual activity: Yes  Other Topics Concern  . Not on file  Social History Narrative   Husband passed away from Cancer in Aug 30, 2017.   Social Determinants of Health   Financial Resource Strain: Not on file  Food Insecurity: Not on file  Transportation Needs: Not on file  Physical Activity: Not on file  Stress: Not on file  Social Connections: Not on file  Intimate Partner Violence: Not on file      Review of Systems  Constitutional: Positive for chills, diaphoresis and fever.  HENT: Negative.   Respiratory: Positive for cough and shortness of breath. Negative for chest tightness, wheezing and stridor.   Cardiovascular: Negative for chest pain, palpitations and leg swelling.   Gastrointestinal: Negative.   Genitourinary: Negative.   Musculoskeletal: Positive for back pain.       Objective:   Physical Exam Constitutional:      General: She is not in acute distress.    Appearance: She is well-developed. She is not diaphoretic.  HENT:     Head: Normocephalic and atraumatic.     Mouth/Throat:     Pharynx: No oropharyngeal exudate.  Eyes:     Conjunctiva/sclera: Conjunctivae normal.  Cardiovascular:     Rate and Rhythm: Regular rhythm. Tachycardia present.     Heart sounds: Normal heart sounds.  Pulmonary:     Effort: Pulmonary effort is normal. No respiratory distress.     Breath sounds: Examination of the right-lower field reveals decreased breath sounds. Decreased breath sounds present. No wheezing or rales.    Chest:     Chest wall: No tenderness.  Abdominal:     General: Bowel sounds are normal. There is no distension.     Palpations: Abdomen is soft.     Tenderness: There is no abdominal tenderness. There is no guarding or rebound.  Musculoskeletal:     Lumbar back: Spasms and tenderness present. No bony tenderness. Normal range of motion. Negative right straight leg raise test and negative left straight leg raise test.       Back:  Skin:    Findings: No erythema or rash.           Assessment & Plan:  Acute right-sided low back pain without sciatica - Plan: COMPLETE METABOLIC PANEL WITH GFR, CBC with Differential/Platelet, predniSONE (DELTASONE) 20 MG tablet, cyclobenzaprine (FLEXERIL) 10 MG tablet, DG Lumbar Spine Complete  Patient has not had lab work that I can see.  She is on 2 diuretics and she does have a history of some mild age-related chronic kidney disease.  Therefore I would like to obtain a CMP to determine what her creatinine is along with a CBC.  Given her history of chronic kidney disease and a previous history of a bleeding ulcer, I am hesitant to put her on  an NSAID.  Therefore I will use prednisone because I believe that  I can help improve her pain by calming the inflammation with the prednisone safely.  I also believe this is most likely a muscle spasms I will give her Flexeril.  I recommended that she use this medication cautiously.  She should try 1/2 to 1 tablet every 8 hours as needed for muscle spasms.  Given her previous history of breast cancer and lymphoma I would like to obtain an x-ray of the lumbar spine to rule out vertebral fractures as well as any skeletal lesions however I suspect that this is most likely muscular.  Await the results of the x-ray and her response to prednisone.

## 2020-11-24 LAB — COMPLETE METABOLIC PANEL WITH GFR
AG Ratio: 1.8 (calc) (ref 1.0–2.5)
ALT: 27 U/L (ref 6–29)
AST: 26 U/L (ref 10–35)
Albumin: 4.4 g/dL (ref 3.6–5.1)
Alkaline phosphatase (APISO): 72 U/L (ref 37–153)
BUN/Creatinine Ratio: 21 (calc) (ref 6–22)
BUN: 26 mg/dL — ABNORMAL HIGH (ref 7–25)
CO2: 32 mmol/L (ref 20–32)
Calcium: 10.5 mg/dL — ABNORMAL HIGH (ref 8.6–10.4)
Chloride: 98 mmol/L (ref 98–110)
Creat: 1.24 mg/dL — ABNORMAL HIGH (ref 0.60–0.93)
GFR, Est African American: 51 mL/min/{1.73_m2} — ABNORMAL LOW (ref >=60)
GFR, Est Non African American: 44 mL/min/{1.73_m2} — ABNORMAL LOW (ref >=60)
Globulin: 2.4 g/dL (calc) (ref 1.9–3.7)
Glucose, Bld: 112 mg/dL — ABNORMAL HIGH (ref 65–99)
Potassium: 4.7 mmol/L (ref 3.5–5.3)
Sodium: 140 mmol/L (ref 135–146)
Total Bilirubin: 0.7 mg/dL (ref 0.2–1.2)
Total Protein: 6.8 g/dL (ref 6.1–8.1)

## 2020-11-24 LAB — CBC WITH DIFFERENTIAL/PLATELET
Absolute Monocytes: 1176 cells/uL — ABNORMAL HIGH (ref 200–950)
Basophils Absolute: 69 cells/uL (ref 0–200)
Basophils Relative: 0.7 %
Eosinophils Absolute: 206 cells/uL (ref 15–500)
Eosinophils Relative: 2.1 %
HCT: 39.8 % (ref 35.0–45.0)
Hemoglobin: 13.8 g/dL (ref 11.7–15.5)
Lymphs Abs: 1470 cells/uL (ref 850–3900)
MCH: 31.5 pg (ref 27.0–33.0)
MCHC: 34.7 g/dL (ref 32.0–36.0)
MCV: 90.9 fL (ref 80.0–100.0)
MPV: 9 fL (ref 7.5–12.5)
Monocytes Relative: 12 %
Neutro Abs: 6880 cells/uL (ref 1500–7800)
Neutrophils Relative %: 70.2 %
Platelets: 465 10*3/uL — ABNORMAL HIGH (ref 140–400)
RBC: 4.38 10*6/uL (ref 3.80–5.10)
RDW: 12 % (ref 11.0–15.0)
Total Lymphocyte: 15 %
WBC: 9.8 10*3/uL (ref 3.8–10.8)

## 2020-12-14 ENCOUNTER — Other Ambulatory Visit: Payer: Self-pay | Admitting: Family Medicine

## 2020-12-29 ENCOUNTER — Ambulatory Visit (INDEPENDENT_AMBULATORY_CARE_PROVIDER_SITE_OTHER): Payer: Medicare PPO

## 2020-12-29 DIAGNOSIS — I442 Atrioventricular block, complete: Secondary | ICD-10-CM

## 2020-12-29 LAB — CUP PACEART REMOTE DEVICE CHECK
Battery Remaining Longevity: 94 mo
Battery Voltage: 2.99 V
Brady Statistic AP VP Percent: 0.01 %
Brady Statistic AP VS Percent: 0 %
Brady Statistic AS VP Percent: 99.97 %
Brady Statistic AS VS Percent: 0.02 %
Brady Statistic RA Percent Paced: 0.01 %
Brady Statistic RV Percent Paced: 99.98 %
Date Time Interrogation Session: 20220118005015
Implantable Lead Implant Date: 20181009
Implantable Lead Implant Date: 20181009
Implantable Lead Location: 753859
Implantable Lead Location: 753860
Implantable Lead Model: 5076
Implantable Lead Model: 5076
Implantable Pulse Generator Implant Date: 20181009
Lead Channel Impedance Value: 247 Ohm
Lead Channel Impedance Value: 323 Ohm
Lead Channel Impedance Value: 342 Ohm
Lead Channel Impedance Value: 494 Ohm
Lead Channel Pacing Threshold Amplitude: 0.625 V
Lead Channel Pacing Threshold Amplitude: 0.875 V
Lead Channel Pacing Threshold Pulse Width: 0.4 ms
Lead Channel Pacing Threshold Pulse Width: 0.4 ms
Lead Channel Sensing Intrinsic Amplitude: 2 mV
Lead Channel Sensing Intrinsic Amplitude: 2 mV
Lead Channel Sensing Intrinsic Amplitude: 3.625 mV
Lead Channel Setting Pacing Amplitude: 1.5 V
Lead Channel Setting Pacing Amplitude: 2.5 V
Lead Channel Setting Pacing Pulse Width: 0.4 ms
Lead Channel Setting Sensing Sensitivity: 2 mV

## 2021-01-12 NOTE — Progress Notes (Signed)
Remote pacemaker transmission.   

## 2021-01-22 ENCOUNTER — Other Ambulatory Visit: Payer: Self-pay | Admitting: Family Medicine

## 2021-01-28 ENCOUNTER — Other Ambulatory Visit: Payer: Self-pay | Admitting: Cardiology

## 2021-02-02 ENCOUNTER — Other Ambulatory Visit: Payer: Self-pay | Admitting: Family Medicine

## 2021-02-05 DIAGNOSIS — H401131 Primary open-angle glaucoma, bilateral, mild stage: Secondary | ICD-10-CM | POA: Diagnosis not present

## 2021-02-05 DIAGNOSIS — H52203 Unspecified astigmatism, bilateral: Secondary | ICD-10-CM | POA: Diagnosis not present

## 2021-02-05 DIAGNOSIS — Z961 Presence of intraocular lens: Secondary | ICD-10-CM | POA: Diagnosis not present

## 2021-02-09 ENCOUNTER — Other Ambulatory Visit: Payer: Self-pay | Admitting: Cardiology

## 2021-02-26 ENCOUNTER — Other Ambulatory Visit: Payer: Self-pay

## 2021-02-26 ENCOUNTER — Ambulatory Visit: Payer: Medicare PPO | Admitting: Internal Medicine

## 2021-02-26 ENCOUNTER — Encounter: Payer: Self-pay | Admitting: Internal Medicine

## 2021-02-26 VITALS — BP 152/74 | HR 91 | Ht 65.0 in | Wt 129.0 lb

## 2021-02-26 DIAGNOSIS — E785 Hyperlipidemia, unspecified: Secondary | ICD-10-CM | POA: Diagnosis not present

## 2021-02-26 DIAGNOSIS — Z9889 Other specified postprocedural states: Secondary | ICD-10-CM

## 2021-02-26 DIAGNOSIS — I442 Atrioventricular block, complete: Secondary | ICD-10-CM

## 2021-02-26 DIAGNOSIS — I10 Essential (primary) hypertension: Secondary | ICD-10-CM | POA: Diagnosis not present

## 2021-02-26 NOTE — Patient Instructions (Addendum)
Medication Instructions:  Your physician recommends that you continue on your current medications as directed. Please refer to the Current Medication list given to you today.  Labwork: None ordered.  Testing/Procedures: None ordered.  Follow-Up: Your physician wants you to follow-up in: one year with     Tommye Standard, PA-C     You will receive a reminder letter in the mail two months in advance. If you don't receive a letter, please call our office to schedule the follow-up appointment.  Remote monitoring is used to monitor your Pacemaker from home. This monitoring reduces the number of office visits required to check your device to one time per year. It allows [place] to keep an eye on the functioning of your device to ensure it is working properly. You are scheduled for a device check from home on 03/30/21. You may send your transmission at any time that day. If you have a wireless device, the transmission will be sent automatically. After your physician reviews your transmission, you will receive a postcard with your next transmission date.  Any Other Special Instructions Will Be Listed Below (If Applicable).  If you need a refill on your cardiac medications before your next appointment, please call your pharmacy.

## 2021-02-26 NOTE — Progress Notes (Signed)
PCP: Susy Frizzle, MD Primary Cardiologist: Dr Percival Spanish Primary EP:  Dr Rayann Heman  Patricia [name] is a 70 y.o. female who presents today for routine electrophysiology followup.  Since last being seen in our clinic, the patient reports doing very well.  Today, she denies symptoms of palpitations, chest pain, shortness of breath,  lower extremity edema, dizziness, presyncope, or syncope.  She felt unwell on 01/31/21.  On this day she had about 11 minutes of atrial tachycardia.  Interestingly, on 01/30/21, she had two episodes of nonsustained VT.  She was asymptomatic with this. She has had no other arrhythmic events.    The patient is otherwise without complaint today.   Past Medical History:  Diagnosis Date  . Atrial flutter (HCC)    Ablated 1998 Dr. Caryl Comes  . Bleeding gastric ulcer    back in 2000  . Breast cancer ([place])   . COPD (chronic obstructive pulmonary disease) (HCC)    from radiation  . GERD (gastroesophageal reflux disease)   . Hodgkin's lymphoma ([place])    Treated with radiation and chemo  . Mitral regurgitation    pvc  . Osteoporosis    lumbar verterbral fracture, left ankle fracture, dexa 2015  . Pancreatitis   . Pneumonia   . Prediabetes   . 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 Ulcer    Past Surgical History:  Procedure Laterality Date  . BREAST SURGERY     breast cancer since 2005   left mastectomy  . CARDIAC ELECTROPHYSIOLOGY [place] AND ABLATION  1999  . CATARACT EXTRACTION, BILATERAL    . COLONOSCOPY    . IR RADIOLOGY PERIPHERAL GUIDED IV START  08/03/2017  . IR US GUIDE VASC ACCESS RIGHT  08/03/2017  . LAPAROTOMY    . MASTECTOMY  2005   Left-axillary  . MITRAL VALVE REPAIR Right 09/13/2017   Procedure: MINIMALLY INVASIVE MITRAL VALVE REPLACEMENT (MVR) with Medtronic Mosaic Porcine Heart Valve size 30m;  Surgeon: ORexene Alberts MD;   [facility]  Service: Open Heart Surgery;  Laterality: Right;  . ORIF ANKLE FRACTURE Left 03/21/2014   Procedure: LEFT ANKLE FRACTURE OPEN TREATMENT BILMALLEOLAR INCLUDES INTERNAL FIXATION ;  Surgeon: TRenette Butters MD;  [place] [facility]  Service: Orthopedics;  Laterality: Left;  . PACEMAKER IMPLANT N/A 09/19/2017   Medtronic Azure XT MRI conditional dual-chamber pacemaker for symptomatic complete heart block  by Dr ARayann Heman . RIGHT/LEFT HEART CATH AND CORONARY ANGIOGRAPHY N/A 06/27/2017   Procedure: Right/Left Heart Cath and Coronary Angiography;  Surgeon: MLarey [name] MD;  [facility];  Service: Cardiovascular;  Laterality: N/A;  . 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 TEE WITHOUT CARDIOVERSION  06/12/2012   Procedure: TRANSESOPHAGEAL ECHOCARDIOGRAM (TEE);  Surgeon: DLarey [name] MD;  [place] [facility]  Service: Cardiovascular;  Laterality: N/A;  . TEE WITHOUT CARDIOVERSION N/A 06/29/2017   Procedure: TRANSESOPHAGEAL ECHOCARDIOGRAM (TEE);  Surgeon: SJerline Pain MD;  [facility];  Service: Cardiovascular;  Laterality: N/A;  . TEE WITHOUT CARDIOVERSION N/A 09/13/2017   Procedure: TRANSESOPHAGEAL ECHOCARDIOGRAM (TEE);  Surgeon: ORexene Alberts MD;  [place] [facility]  Service: Open Heart Surgery;  Laterality: N/A;  . TONSILLECTOMY AND ADENOIDECTOMY      ROS- all systems are reviewed and negative except as per HPI above  Current Outpatient Medications  Medication Sig Dispense Refill  .  acetaminophen (TYLENOL) 325 MG tablet Take 2 tablets (650 mg total) by mouth 2 (two) times daily as needed for moderate pain or headache.    . alendronate (FOSAMAX) 70 MG tablet TAKE 1 TABLET BY MOUTH EVERY 7 DAYS. TAKE WITH A FULL GLASS OF WATER ON AN EMPTY STOMACH. 12 tablet 3  . atorvastatin (LIPITOR) 10 MG tablet TAKE 1 TABLET BY MOUTH ONCE A DAY 90 tablet 3  . Blood Glucose Monitoring Suppl (ACCU-CHEK GUIDE) w/Device KIT 1 Units by Does not  apply route daily. 1 kit 0  . cetirizine (ZYRTEC) 10 MG tablet Take 10 mg by mouth daily as needed for allergies.    . chlorthalidone (HYGROTON) 25 MG tablet TAKE 1 TABLET BY MOUTH ONCE A DAY 90 tablet 2  . cyclobenzaprine (FLEXERIL) 10 MG tablet Take 1 tablet (10 mg total) by mouth 3 (three) times daily as needed for muscle spasms. 30 tablet 0  . esomeprazole (NEXIUM) 40 MG capsule Take 40 mg by mouth daily before breakfast.   12  . furosemide (LASIX) 40 MG tablet TAKE 1 TABLET BY MOUTH ONCE A DAY 90 tablet 2  . glucose blood (ACCU-CHEK GUIDE) test strip USE TO TEST BLOOD SUGARS EVERY MORNING 100 each 3  . KLOR-CON M20 20 MEQ tablet TAKE 1 TABLET BY MOUTH EVERY DAY 90 tablet 1  . Lancets (ACCU-CHEK SOFT TOUCH) lancets Check BS QAM 100 each 3  . Lancets Misc. (ACCU-CHEK SOFTCLIX LANCET DEV) KIT Check BS QAM 1 kit 0  . levothyroxine (SYNTHROID) 50 MCG tablet Take 1 tablet (50 mcg total) by mouth daily. 90 tablet 3  . lipase/protease/amylase (CREON) 12000 units CPEP capsule Take 12,000 Units by mouth 3 (three) times daily before meals.    . metoprolol tartrate (LOPRESSOR) 100 MG tablet Take 1 tablet (100 mg total) by mouth 2 (two) times daily. 180 tablet 2  . Multiple Vitamin (MULTIVITAMIN WITH MINERALS) TABS tablet Take 1 tablet by mouth daily. Centrum Silver.     No current facility-administered medications for this visit.    Physical Exam: Vitals:   02/26/21 1456  BP: (!) 152/74  Pulse: 91  SpO2: 96%  Weight: 129 lb (58.5 kg)  Height: _0  (1.651 m)    GEN- The patient is well appearing, alert and oriented x 3 today.   Head- normocephalic, atraumatic Eyes-  Sclera clear, conjunctiva pink Ears- hearing intact Oropharynx- clear Lungs- Clear to ausculation bilaterally, normal work of breathing Chest- pacemaker pocket is well healed Heart- Regular rate and rhythm, no murmurs, rubs or gallops, PMI not laterally displaced GI- soft, NT, ND, + BS Extremities- no clubbing, cyanosis, or  edema  Pacemaker interrogation- reviewed in detail today,  See PACEART report  ekg tracing ordered today is personally reviewed and shows sinus with V pacing  Assessment and Plan:  1. Symptomatic complete heart block Normal pacemaker function See Pace Art report No changes today she is device dependant today  2. Paroxysmal atrial fibrillation no afib in 4 years She did have short episodes of atrial tachycardia as per HPI Consider Ali Chukson if her AF burden increases CHADS2vas score is 3.  3. HTN Stable No change required today  4. S/p MVR Follows with Dr Percival Spanish  5. NSVT Noted on PPM at 150 bpm Echo 10/09/20 reviewed,  EF is normal Asymptomatic On metoprolol  Risks, benefits and potential toxicities for medications prescribed and/or refilled reviewed with patient today.   Return to see EP APP annually  Thompson Grayer MD, [facility] 02/26/2021  2:58 PM

## 2021-03-13 ENCOUNTER — Other Ambulatory Visit: Payer: Self-pay | Admitting: Family Medicine

## 2021-03-15 ENCOUNTER — Other Ambulatory Visit: Payer: Self-pay | Admitting: Cardiology

## 2021-03-19 NOTE — Progress Notes (Signed)
Subjective:   HADAS JESSOP is a 70 y.o. female who presents for Medicare Annual (Subsequent) preventive examination.  Review of Systems    N/A  Cardiac Risk Factors include: advanced age (>19men, >41 women)     Objective:    Today's Vitals   03/22/21 1405 03/22/21 1411  BP: 126/64   Pulse: 81   Temp: 98.7 F (37.1 C)   TempSrc: Oral   SpO2: 95%   Weight: 126 lb 2 oz (57.2 kg)   Height: $Remove'5\' 5"'gSvNIpM$  (1.651 m)   PainSc:  7    Body mass index is 20.99 kg/m.  Advanced Directives 03/22/2021 10/31/2017 09/15/2017 09/11/2017 06/29/2017 06/27/2017 12/25/2014  Does Patient Have a Medical Advance Directive? No No No No No No No  Does patient want to make changes to medical advance directive? No - Patient declined - - - - - -  Would patient like information on creating a medical advance directive? - - No - Patient declined (No Data) No - Patient declined No - Patient declined Yes - Educational materials given  Pre-existing out of facility DNR order (yellow form or pink MOST form) - - - - - - -    Current Medications (verified) Outpatient Encounter Medications as of 03/22/2021  Medication Sig  . acetaminophen (TYLENOL) 325 MG tablet Take 2 tablets (650 mg total) by mouth 2 (two) times daily as needed for moderate pain or headache.  . alendronate (FOSAMAX) 70 MG tablet TAKE 1 TABLET BY MOUTH EVERY 7 DAYS. TAKE WITH A FULL GLASS OF WATER ON AN EMPTY STOMACH.  Marland Kitchen atorvastatin (LIPITOR) 10 MG tablet TAKE 1 TABLET BY MOUTH ONCE A DAY  . Blood Glucose Monitoring Suppl (ACCU-CHEK GUIDE) w/Device KIT 1 Units by Does not apply route daily.  . cetirizine (ZYRTEC) 10 MG tablet Take 10 mg by mouth daily as needed for allergies.  . chlorthalidone (HYGROTON) 25 MG tablet TAKE 1 TABLET BY MOUTH ONCE A DAY  . cyclobenzaprine (FLEXERIL) 10 MG tablet Take 1 tablet (10 mg total) by mouth 3 (three) times daily as needed for muscle spasms.  Marland Kitchen esomeprazole (NEXIUM) 40 MG capsule Take 40 mg by mouth daily before  breakfast.   . furosemide (LASIX) 40 MG tablet TAKE 1 TABLET BY MOUTH ONCE A DAY  . glucose blood (ACCU-CHEK GUIDE) test strip USE TO TEST BLOOD SUGARS EVERY MORNING  . Lancets (ACCU-CHEK SOFT TOUCH) lancets Check BS QAM  . Lancets Misc. (ACCU-CHEK SOFTCLIX LANCET DEV) KIT Check BS QAM  . levothyroxine (SYNTHROID) 50 MCG tablet TAKE 1 TABLET BY MOUTH ONCE A DAY  . lipase/protease/amylase (CREON) 12000 units CPEP capsule Take 12,000 Units by mouth 3 (three) times daily before meals.  . metoprolol tartrate (LOPRESSOR) 100 MG tablet Take 1 tablet (100 mg total) by mouth 2 (two) times daily.  . Multiple Vitamin (MULTIVITAMIN WITH MINERALS) TABS tablet Take 1 tablet by mouth daily. Centrum Silver.  . mupirocin ointment (BACTROBAN) 2 % Apply 1 application topically daily. Qd to biopsy sites  . potassium chloride SA (KLOR-CON) 20 MEQ tablet TAKE 1 TABLET BY MOUTH ONCE A DAY   No facility-administered encounter medications on file as of 03/22/2021.    Allergies (verified) Azithromycin, Penicillins, Sulfa antibiotics, and Cheese   History: Past Medical History:  Diagnosis Date  . Atrial flutter (HCC)    Ablated 1998 Dr. Caryl Comes  . Basal cell carcinoma 06/05/2019   R low back, EDC 07/16/19  . Bleeding gastric ulcer    back in 2000  .  Breast cancer ([place])   . COPD (chronic obstructive pulmonary disease) (HCC)    from radiation  . GERD (gastroesophageal reflux disease)   . Hodgkin's lymphoma ([place])    Treated with radiation and chemo  . Mitral regurgitation    pvc  . Osteoporosis    lumbar verterbral fracture, left ankle fracture, dexa 2015  . Pancreatitis   . Pneumonia   . Prediabetes   . 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 Ulcer    Past Surgical History:  Procedure Laterality Date  . BREAST SURGERY     breast cancer since 2005   left mastectomy  . CARDIAC  ELECTROPHYSIOLOGY [place] AND ABLATION  1999  . CARDIAC VALVE REPLACEMENT N/A    Phreesia 03/19/2021  . CATARACT EXTRACTION, BILATERAL    . COLONOSCOPY    . IR RADIOLOGY PERIPHERAL GUIDED IV START  08/03/2017  . IR US GUIDE VASC ACCESS RIGHT  08/03/2017  . LAPAROTOMY    . MASTECTOMY  2005   Left-axillary  . MITRAL VALVE REPAIR Right 09/13/2017   Procedure: MINIMALLY INVASIVE MITRAL VALVE REPLACEMENT (MVR) with Medtronic Mosaic Porcine Heart Valve size 18mm;  Surgeon: Rexene Alberts, MD;  Location: [place];  Service: Open Heart Surgery;  Laterality: Right;  . ORIF ANKLE FRACTURE Left 03/21/2014   Procedure: LEFT ANKLE FRACTURE OPEN TREATMENT BILMALLEOLAR INCLUDES INTERNAL FIXATION ;  Surgeon: Renette Butters, MD;  Location: [place];  Service: Orthopedics;  Laterality: Left;  . PACEMAKER IMPLANT N/A 09/19/2017   Medtronic Azure XT MRI conditional dual-chamber pacemaker for symptomatic complete heart block  by Dr Rayann Heman  . RIGHT/LEFT HEART CATH AND CORONARY ANGIOGRAPHY N/A 06/27/2017   Procedure: Right/Left Heart Cath and Coronary Angiography;  Surgeon: Larey [name], MD;  Location: [employer] [facility] [facility];  Service: Cardiovascular;  Laterality: N/A;  . 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 TEE WITHOUT CARDIOVERSION  06/12/2012   Procedure: TRANSESOPHAGEAL ECHOCARDIOGRAM (TEE);  Surgeon: Larey [name], MD;  Location: [place];  Service: Cardiovascular;  Laterality: N/A;  . TEE WITHOUT CARDIOVERSION N/A 06/29/2017   Procedure: TRANSESOPHAGEAL ECHOCARDIOGRAM (TEE);  Surgeon: Jerline Pain, MD;  Location: [facility];  Service: Cardiovascular;  Laterality: N/A;  . TEE WITHOUT CARDIOVERSION N/A 09/13/2017   Procedure: TRANSESOPHAGEAL ECHOCARDIOGRAM (TEE);  Surgeon: Rexene Alberts, MD;  Location: [place];  Service: Open Heart Surgery;  Laterality: N/A;  . TONSILLECTOMY AND ADENOIDECTOMY     Family History  Problem Relation Age of Onset  . Rheumatic fever Father        Died  suddenly age 19  . Diabetes Father   . Cancer Father        colon  . Heart disease Father        rheumatic fever x 3  . Arthritis Maternal Grandmother   . Hypertension Maternal Grandmother   . Diabetes Paternal Grandmother   . Vision loss Paternal Grandmother        glaucoma   Social History   Socioeconomic History  . Marital status: Widowed    Spouse name: Not on file  . Number of children: Not on file  . Years of education: Not on file  . Highest education level: Master's degree (e.g., MA, MS, MEng, MEd, MSW, MBA)  Occupational History  . Occupation: Pharmacist, hospital (retired)  Tobacco Use  . Smoking status: Never Smoker  . Smokeless tobacco: Never  Used  Vaping Use  . Vaping Use: Never used  Substance and Sexual Activity  . Alcohol use: No  . Drug use: No  . Sexual activity: Yes  Other Topics Concern  . Not on file  Social History Narrative   Husband passed away from Cancer in 09-12-17.   Social Determinants of Health   Financial Resource Strain: Low Risk   . Difficulty of Paying Living Expenses: Not hard at all  Food Insecurity: No Food Insecurity  . Worried About Charity fundraiser in the Last Year: Never true  . Ran Out of Food in the Last Year: Never true  Transportation Needs: No Transportation Needs  . Lack of Transportation (Medical): No  . Lack of Transportation (Non-Medical): No  Physical Activity: Sufficiently Active  . Days of Exercise per Week: 7 days  . Minutes of Exercise per Session: 30 min  Stress: No Stress Concern Present  . Feeling of Stress : Not at all  Social Connections: Moderately Integrated  . Frequency of Communication with Friends and Family: More than three times a week  . Frequency of Social Gatherings with Friends and Family: Three times a week  . Attends Religious Services: More than 4 times per year  . Active Member of Clubs or Organizations: Yes  . Attends Archivist Meetings: More than 4 times per year  . Marital Status:  Widowed    Tobacco Counseling Counseling given: Not Answered   Clinical Intake:  Pre-visit preparation completed: Yes  Pain : 0-10 Pain Score: 7  Pain Type: Chronic pain Pain Location: Back Pain Orientation: Lower Pain Radiating Towards: legs Pain Onset: More than a month ago Pain Frequency: Constant     Nutritional Risks: None Diabetes: No  How often do you need to have someone help you when you read instructions, pamphlets, or other written materials from your doctor or pharmacy?: 1 - Never  Diabetic?No   Interpreter Needed?: No  Information entered by :: [place] of Daily Living In your present state of health, do you have any difficulty performing the following activities: 03/22/2021  Hearing? N  Vision? N  Difficulty concentrating or making decisions? N  Walking or climbing stairs? N  Dressing or bathing? N  Doing errands, shopping? N  Preparing Food and eating ? N  Using the Toilet? N  In the past six months, have you accidently leaked urine? N  Do you have problems with loss of bowel control? N  Managing your Medications? N  Managing your Finances? N  Housekeeping or managing your Housekeeping? N  Some recent data might be hidden    Patient Care Team: Susy Frizzle, MD as PCP - General (Family Medicine) Minus Breeding, MD as PCP - Cardiology (Cardiology) Juanita Craver, MD as Consulting Physician (Gastroenterology)  Indicate any recent Medical Services you may have received from other than [facility] providers in the past year (date may be approximate).     Assessment:   This is a routine wellness examination for Lelani.  Hearing/Vision screen  Hearing Screening   '125Hz'$  $Remo'250Hz'vOrnU$'500Hz'$'1000Hz'$'2000Hz'$'3000Hz'$'4000Hz'$'6000Hz'$'8000Hz'$   Right ear:           Left ear:           Vision Screening Comments: Gets eyes examined every 6 months. Wears glasses. Has hx of cataract extractions  Dietary issues and exercise activities discussed: Current  Exercise Habits: Home exercise routine, Type of exercise: walking (stationary bike), Time (Minutes): 30,  Frequency (Times/Week): 7, Weekly Exercise (Minutes/Week): 210, Intensity: Mild, Exercise limited by: None identified  Goals    . Patient Stated     I would like to be maintain my current level.      Depression Screen PHQ 2/9 Scores 03/22/2021 12/19/2019 02/09/2018 01/23/2018 11/06/2017  PHQ - 2 Score 0 0 0 2 0  PHQ- 9 Score - - - 5 -    Fall Risk Fall Risk  03/22/2021 12/19/2019 01/23/2018 10/31/2017  Falls in the past year? 0 0 No No  Number falls in past yr: 0 - - -  Injury with Fall? 0 - - -  Risk for fall due to : No Fall Risks - - -  Follow up Falls evaluation completed;Falls prevention discussed Falls evaluation completed - -    FALL RISK PREVENTION PERTAINING TO THE HOME:  Any stairs in or around the home? No  If so, are there any without handrails? No  Home free of loose throw rugs in walkways, pet beds, electrical cords, etc? Yes  Adequate lighting in your home to reduce risk of falls? Yes   ASSISTIVE DEVICES UTILIZED TO PREVENT FALLS:  Life alert? No  Use of a cane, walker or w/c? No  Grab bars in the bathroom? No  Shower chair or bench in shower? No  Elevated toilet seat or a handicapped toilet? No   TIMED UP AND GO:  Was the test performed? Yes .  Length of time to ambulate 10 feet: 3 sec.   Gait steady and fast without use of assistive device  Cognitive Function:   Normal cognitive status assessed by direct observation by this Nurse Health Advisor. No abnormalities found.        Immunizations Immunization History  Administered Date(s) Administered  . Influenza Split 09/15/2015  . Influenza, High Dose Seasonal PF 09/16/2017, 08/02/2019  . Influenza,inj,Quad PF,6+ Mos 09/15/2016  . Influenza-Unspecified 08/03/2018, 09/03/2020  . PFIZER(Purple Top)SARS-COV-2 Vaccination 01/02/2020, 01/24/2020, 09/26/2020  . Pneumococcal Conjugate-13 07/07/2016  .  Pneumococcal Polysaccharide-23 01/23/2018  . Zoster Recombinat (Shingrix) 08/03/2018, 12/17/2018    TDAP status: Due, Education has been provided regarding the importance of this vaccine. Advised may receive this vaccine at local pharmacy or Health Dept. Aware to provide a copy of the vaccination record if obtained from local pharmacy or Health Dept. Verbalized acceptance and understanding.  Flu Vaccine status: Up to date  Pneumococcal vaccine status: Up to date  Covid-19 vaccine status: Completed vaccines  Qualifies for Shingles Vaccine? Yes   Zostavax completed No   Shingrix Completed?: Yes  Screening Tests Health Maintenance  Topic Date Due  . TETANUS/TDAP  Never done  . COVID-19 Vaccine (4 - Booster for Pfizer series) 03/27/2021  . MAMMOGRAM  06/12/2021  . INFLUENZA VACCINE  07/12/2021  . COLONOSCOPY (Pts 45-50yrs Insurance coverage will need to be confirmed)  11/06/2021  . DEXA SCAN  Completed  . Hepatitis C Screening  Completed  . PNA vac Low Risk Adult  Completed  . HPV VACCINES  Aged Out    Health Maintenance  Health Maintenance Due  Topic Date Due  . TETANUS/TDAP  Never done    Colorectal cancer screening: Type of screening: Colonoscopy. Completed 11/06/2021. Repeat every 10 years  Mammogram status: Completed 06/12/2020. Repeat every year  Bone Density status: Completed 03/16/2020. Results reflect: Bone density results: OSTEOPOROSIS. Repeat every 2 years.  Lung Cancer Screening: (Low Dose CT Chest recommended if Age 72-80 years, 30 pack-year currently smoking OR have quit w/in 15years.) does  not qualify.   Lung Cancer Screening Referral: N/A   Additional Screening:  Hepatitis C Screening: does qualify; Completed 03/20/2018  Vision Screening: Recommended annual ophthalmology exams for early detection of glaucoma and other disorders of the eye. Is the patient up to date with their annual eye exam?  Yes  Who is the provider or what is the name of the office  in which the patient attends annual eye exams? Dr. Ellie Lunch If pt is not established with a provider, would they like to be referred to a provider to establish care? No .   Dental Screening: Recommended annual dental exams for proper oral hygiene  Community Resource Referral / Chronic Care Management: CRR required this visit?  No   CCM required this visit?  No      Plan:     I have personally reviewed and noted the following in the patient's chart:   . Medical and social history . Use of alcohol, tobacco or illicit drugs  . Current medications and supplements . Functional ability and status . Nutritional status . Physical activity . Advanced directives . List of other physicians . Hospitalizations, surgeries, and ER visits in previous 12 months . Vitals . Screenings to include cognitive, depression, and falls . Referrals and appointments  In addition, I have reviewed and discussed with patient certain preventive protocols, quality metrics, and best practice recommendations. A written personalized care plan for preventive services as well as general preventive health recommendations were provided to patient.     Ofilia Neas, LPN   08/13/2223   Nurse Notes: None

## 2021-03-22 ENCOUNTER — Other Ambulatory Visit: Payer: Self-pay

## 2021-03-22 ENCOUNTER — Ambulatory Visit: Payer: Medicare PPO | Admitting: Dermatology

## 2021-03-22 ENCOUNTER — Ambulatory Visit (INDEPENDENT_AMBULATORY_CARE_PROVIDER_SITE_OTHER): Payer: Medicare PPO

## 2021-03-22 ENCOUNTER — Encounter: Payer: Self-pay | Admitting: Dermatology

## 2021-03-22 VITALS — BP 126/64 | HR 81 | Temp 98.7°F | Ht 65.0 in | Wt 126.1 lb

## 2021-03-22 DIAGNOSIS — B079 Viral wart, unspecified: Secondary | ICD-10-CM

## 2021-03-22 DIAGNOSIS — E785 Hyperlipidemia, unspecified: Secondary | ICD-10-CM | POA: Diagnosis not present

## 2021-03-22 DIAGNOSIS — Z0001 Encounter for general adult medical examination with abnormal findings: Secondary | ICD-10-CM | POA: Diagnosis not present

## 2021-03-22 DIAGNOSIS — Z0000 Encounter for general adult medical examination without abnormal findings: Secondary | ICD-10-CM

## 2021-03-22 DIAGNOSIS — C4441 Basal cell carcinoma of skin of scalp and neck: Secondary | ICD-10-CM | POA: Diagnosis not present

## 2021-03-22 DIAGNOSIS — C44212 Basal cell carcinoma of skin of right ear and external auricular canal: Secondary | ICD-10-CM

## 2021-03-22 DIAGNOSIS — D492 Neoplasm of unspecified behavior of bone, soft tissue, and skin: Secondary | ICD-10-CM

## 2021-03-22 DIAGNOSIS — L578 Other skin changes due to chronic exposure to nonionizing radiation: Secondary | ICD-10-CM | POA: Diagnosis not present

## 2021-03-22 DIAGNOSIS — R7303 Prediabetes: Secondary | ICD-10-CM | POA: Diagnosis not present

## 2021-03-22 DIAGNOSIS — Z85828 Personal history of other malignant neoplasm of skin: Secondary | ICD-10-CM

## 2021-03-22 DIAGNOSIS — B078 Other viral warts: Secondary | ICD-10-CM

## 2021-03-22 DIAGNOSIS — D485 Neoplasm of uncertain behavior of skin: Secondary | ICD-10-CM

## 2021-03-22 HISTORY — DX: Personal history of other malignant neoplasm of skin: Z85.828

## 2021-03-22 MED ORDER — MUPIROCIN 2 % EX OINT: 1.0000 "application " | TOPICAL_OINTMENT | Freq: Every day | CUTANEOUS | 1 refills | Status: DC

## 2021-03-22 NOTE — Patient Instructions (Signed)
Patricia [name] , Thank you for taking time to come for your Medicare Wellness Visit. I appreciate your ongoing commitment to your health goals. Please review the following plan we discussed and let me know if I can assist you in the future.   Screening recommendations/referrals: Colonoscopy: Up to date, next due 11/06/2021 Mammogram: Up to date, next due 06/12/2021 Bone Density: Up to date, next due 03/16/2022 Recommended yearly ophthalmology/optometry visit for glaucoma screening and checkup Recommended yearly dental visit for hygiene and checkup  Vaccinations: Influenza vaccine: Up to date, next due fall 2022  Pneumococcal vaccine: Completed series  Tdap vaccine: Up to date, next due 04/04/2023 Shingles vaccine: Completed series    Advanced directives: Advance directive discussed with you today. Even though you declined this today please call our office should you change your mind and we can give you the proper paperwork for you to fill out.   Conditions/risks identified: None   Next appointment: 03/25/2021 @ 8:00 am with [place] 65 Years and Older, Female Preventive care refers to lifestyle choices and visits with your health care provider that can promote health and wellness. What does preventive care include?  A yearly physical exam. This is also called an annual well check.  Dental exams once or twice a year.  Routine eye exams. Ask your health care provider how often you should have your eyes checked.  Personal lifestyle choices, including:  Daily care of your teeth and gums.  Regular physical activity.  Eating a healthy diet.  Avoiding tobacco and drug use.  Limiting alcohol use.  Practicing safe sex.  Taking low-dose aspirin every day.  Taking vitamin and mineral supplements as recommended by your health care provider. What happens during an annual well check? The services and screenings done by your health care  provider during your annual well check will depend on your age, overall health, lifestyle risk factors, and family history of disease. Counseling  Your health care provider may ask you questions about your:  Alcohol use.  Tobacco use.  Drug use.  Emotional well-being.  Home and relationship well-being.  Sexual activity.  Eating habits.  History of falls.  Memory and ability to understand (cognition).  Work and work Statistician.  Reproductive health. Screening  You may have the following tests or measurements:  Height, weight, and BMI.  Blood pressure.  Lipid and cholesterol levels. These may be checked every 5 years, or more frequently if you are over 98 years old.  Skin check.  Lung cancer screening. You may have this screening every year starting at age 9 if you have a 30-pack-year history of smoking and currently smoke or have quit within the past 15 years.  Fecal occult blood test (FOBT) of the stool. You may have this test every year starting at age 54.  Flexible sigmoidoscopy or colonoscopy. You may have a sigmoidoscopy every 5 years or a colonoscopy every 10 years starting at age 47.  Hepatitis C blood test.  Hepatitis B blood test.  Sexually transmitted disease (STD) testing.  Diabetes screening. This is done by checking your blood sugar (glucose) after you have not eaten for a while (fasting). You may have this done every 1-3 years.  Bone density scan. This is done to screen for osteoporosis. You may have this done starting at age 62.  Mammogram. This may be done every 1-2 years. Talk to your health care provider about how often you should have regular mammograms. Talk  with your health care provider about your test results, treatment options, and if necessary, the need for more tests. Vaccines  Your health care provider may recommend certain vaccines, such as:  Influenza vaccine. This is recommended every year.  Tetanus, diphtheria, and acellular  pertussis (Tdap, Td) vaccine. You may need a Td booster every 10 years.  Zoster vaccine. You may need this after age 18.  Pneumococcal 13-valent conjugate (PCV13) vaccine. One dose is recommended after age 25.  Pneumococcal polysaccharide (PPSV23) vaccine. One dose is recommended after age 89. Talk to your health care provider about which screenings and vaccines you need and how often you need them. This information is not intended to replace advice given to you by your health care provider. Make sure you discuss any questions you have with your health care provider. Document Released: 12/25/2015 Document Revised: 08/17/2016 Document Reviewed: 09/29/2015 Elsevier Interactive Patient Education  2017 [place] Prevention in the Home Falls can cause injuries. They can happen to people of all ages. There are many things you can do to make your home safe and to help prevent falls. What can I do on the outside of my home?  Regularly fix the edges of walkways and driveways and fix any cracks.  Remove anything that might make you trip as you walk through a door, such as a raised step or threshold.  Trim any bushes or trees on the path to your home.  Use bright outdoor lighting.  Clear any walking paths of anything that might make someone trip, such as rocks or tools.  Regularly check to see if handrails are loose or broken. Make sure that both sides of any steps have handrails.  Any raised decks and porches should have guardrails on the edges.  Have any leaves, snow, or ice cleared regularly.  Use sand or salt on walking paths during winter.  Clean up any spills in your garage right away. This includes oil or grease spills. What can I do in the bathroom?  Use night lights.  Install grab bars by the toilet and in the tub and shower. Do not use towel bars as grab bars.  Use non-skid mats or decals in the tub or shower.  If you need to sit down in the shower, use a plastic,  non-slip stool.  Keep the floor dry. Clean up any water that spills on the floor as soon as it happens.  Remove soap buildup in the tub or shower regularly.  Attach bath mats securely with double-sided non-slip rug tape.  Do not have throw rugs and other things on the floor that can make you trip. What can I do in the bedroom?  Use night lights.  Make sure that you have a light by your bed that is easy to reach.  Do not use any sheets or blankets that are too big for your bed. They should not hang down onto the floor.  Have a firm chair that has side arms. You can use this for support while you get dressed.  Do not have throw rugs and other things on the floor that can make you trip. What can I do in the kitchen?  Clean up any spills right away.  Avoid walking on wet floors.  Keep items that you use a lot in easy-to-reach places.  If you need to reach something above you, use a strong step stool that has a grab bar.  Keep electrical cords out of the way.  Do  not use floor polish or wax that makes floors slippery. If you must use wax, use non-skid floor wax.  Do not have throw rugs and other things on the floor that can make you trip. What can I do with my stairs?  Do not leave any items on the stairs.  Make sure that there are handrails on both sides of the stairs and use them. Fix handrails that are broken or loose. Make sure that handrails are as long as the stairways.  Check any carpeting to make sure that it is firmly attached to the stairs. Fix any carpet that is loose or worn.  Avoid having throw rugs at the top or bottom of the stairs. If you do have throw rugs, attach them to the floor with carpet tape.  Make sure that you have a light switch at the top of the stairs and the bottom of the stairs. If you do not have them, ask someone to add them for you. What else can I do to help prevent falls?  Wear shoes that:  Do not have high heels.  Have rubber  bottoms.  Are comfortable and fit you well.  Are closed at the toe. Do not wear sandals.  If you use a stepladder:  Make sure that it is fully opened. Do not climb a closed stepladder.  Make sure that both sides of the stepladder are locked into place.  Ask someone to hold it for you, if possible.  Clearly mark and make sure that you can see:  Any grab bars or handrails.  First and last steps.  Where the edge of each step is.  Use tools that help you move around (mobility aids) if they are needed. These include:  Canes.  Walkers.  Scooters.  Crutches.  Turn on the lights when you go into a dark area. Replace any light bulbs as soon as they burn out.  Set up your furniture so you have a clear path. Avoid moving your furniture around.  If any of your floors are uneven, fix them.  If there are any pets around you, be aware of where they are.  Review your medicines with your doctor. Some medicines can make you feel dizzy. This can increase your chance of falling. Ask your doctor what other things that you can do to help prevent falls. This information is not intended to replace advice given to you by your health care provider. Make sure you discuss any questions you have with your health care provider. Document Released: 09/24/2009 Document Revised: 05/05/2016 Document Reviewed: 01/02/2015 Elsevier Interactive Patient Education  2017 [employer].

## 2021-03-22 NOTE — Progress Notes (Signed)
Follow-Up Visit   Subjective  Patricia [name] is a 70 y.o. female who presents for the following: Skin Tag (R arm, 25m, growing), check sore (Under chin, 62m, sore), and check spot (R cheek, just recently itchy, hx of bleeding).   The following portions of the chart were reviewed this encounter and updated as appropriate:   Tobacco  Allergies  Meds  Problems  Med Hx  Surg Hx  Fam Hx      Review of Systems:  No other skin or systemic complaints except as noted in HPI or Assessment and Plan.  Objective  Well appearing patient in no apparent distress; mood and affect are within normal limits.  A focused examination was performed including R arm, face, neck. Relevant physical exam findings are noted in the Assessment and Plan.  Objective  R neck: 1.1cm tan and pink scaly plaque     Objective  R medail ear: 1.6cm scaly pink plaque with pigment globules     Objective  Right Upper Arm: 0.5cm verrucous papule      Assessment & Plan  Neoplasm of skin (2) R neck  Skin / nail biopsy Type of biopsy: punch   Informed consent: discussed and consent obtained   Anesthesia: the lesion was anesthetized in a standard fashion   Anesthesia comment:  Area prepped with alcohol Local anesthetic: 0.5% bupivicaine buffered with 8.4% NaHC03. Punch size:  3 mm Suture size:  4-0 Suture type: Prolene (polypropylene)   Hemostasis achieved with: suture and pressure   Outcome: patient tolerated procedure well   Post-procedure details: wound care instructions given   Post-procedure details comment:  Ointment and small bandage applied  mupirocin ointment (BACTROBAN) 2 %  Specimen 2 - Surgical pathology Differential Diagnosis: R/O pigmented BCC, SCC, over MM Check Margins: No 1.1cm tan and pink scaly plaque  R medial ear  Skin / nail biopsy Type of biopsy: tangential   Informed consent: discussed and consent obtained   Anesthesia: the lesion was anesthetized in a standard  fashion   Anesthesia comment:  Area prepped with alcohol Local anesthetic: 0.5% bupivicaine buffered with 8.4% NaHC03. Instrument used: flexible razor blade   Hemostasis achieved with: pressure and aluminum chloride   Outcome: patient tolerated procedure well   Post-procedure details: wound care instructions given   Post-procedure details comment:  Ointment and small bandage applied  Specimen 3 - Surgical pathology Differential Diagnosis: D48.5 R/O Pigmented BCC  Check Margins: No 1.6cm scaly pink plaque with pigment globules  Start Mupirocin oint qd to bx sites  Neoplasm of uncertain behavior of skin Right Upper Arm  Epidermal / dermal shaving  Lesion diameter (cm):  0.5 Informed consent: discussed and consent obtained   Patient was prepped and draped in usual sterile fashion: area prepped with alcohol. Anesthesia: the lesion was anesthetized in a standard fashion   Local anesthetic: 0.5% bupivicaine buffered with 8.4% NaHC03. Instrument used: flexible razor blade   Hemostasis achieved with: pressure and aluminum chloride   Outcome: patient tolerated procedure well   Post-procedure details: wound care instructions given   Post-procedure details comment:  Ointment and small bandage applied  Destruction of lesion Complexity: simple   Destruction method: cryotherapy   Informed consent: discussed and consent obtained   Timeout:  patient name, date of birth, surgical site, and procedure verified Lesion destroyed using liquid nitrogen: Yes   Region frozen until ice ball extended beyond lesion: Yes   Outcome: patient tolerated procedure well with no complications   Post-procedure  details: wound care instructions given    Specimen 1 - Surgical pathology Differential Diagnosis: D48.5 Wart vs other Check Margins: No Verrucous papule 0.5cm   Actinic Damage - chronic, secondary to cumulative UV radiation exposure/sun exposure over time - diffuse scaly erythematous macules with  underlying dyspigmentation - Recommend daily broad spectrum sunscreen SPF 30+ to sun-exposed areas, reapply every 2 hours as needed.  - Recommend staying in the shade or wearing long sleeves, sun glasses (UVA+UVB protection) and wide brim hats (4-inch brim around the entire circumference of the hat). - Call for new or changing lesions.     Return in about 2 weeks (around 04/05/2021) for suture removal, 49m TBSE.  I, Othelia Pulling, RMA, am acting as scribe for Forest Gleason, MD .  Documentation: I have reviewed the above documentation for accuracy and completeness, and I agree with the above.  Forest Gleason, MD

## 2021-03-22 NOTE — Patient Instructions (Addendum)
If you have any questions or concerns for your doctor, please call our main line at [phone] and press option 4 to reach your doctor's medical assistant. If no one answers, please leave a voicemail as directed and we will return your call as soon as possible. Messages left after 4 pm will be answered the following business day.   You may also send us a message via MyChart. We typically respond to MyChart messages within 1-2 business days.  For prescription refills, please ask your pharmacy to contact our office. Our fax number is [phone].  If you have an urgent issue when the clinic is closed that cannot wait until the next business day, you can page your doctor at the number below.    Please note that while we do our best to be available for urgent issues outside of office hours, we are not available 24/7.   If you have an urgent issue and are unable to reach us, you may choose to seek medical care at your doctor's office, retail clinic, urgent care center, or emergency room.  If you have a medical emergency, please immediately call 911 or go to the emergency department.  Pager Numbers  - Dr. Kowalski: [phone]  - Dr. Moye: [phone]  - Dr. Stewart: [phone]  In the event of inclement weather, please call our main line at [phone] for an update on the status of any delays or closures.  Dermatology Medication Tips: Please keep the boxes that topical medications come in in order to help keep track of the instructions about where and how to use these. Pharmacies typically print the medication instructions only on the boxes and not directly on the medication tubes.   If your medication is too expensive, please contact our office at [phone] option 4 or send us a message through MyChart.   We are unable to tell what your co-pay for medications will be in advance as this is different depending on your insurance coverage. However, we may be able to find a  substitute medication at lower cost or fill out paperwork to get insurance to cover a needed medication.   If a prior authorization is required to get your medication covered by your insurance company, please allow us 1-2 business days to complete this process.  Drug prices often vary depending on where the prescription is filled and some pharmacies may offer cheaper prices.  The website www.goodrx.com contains coupons for medications through different pharmacies. The prices here do not account for what the cost may be with help from insurance (it may be cheaper with your insurance), but the website can give you the price if you did not use any insurance.  - You can print the associated coupon and take it with your prescription to the pharmacy.  - You may also stop by our office during regular business hours and pick up a GoodRx coupon card.  - If you need your prescription sent electronically to a different pharmacy, notify our office through [facility] MyChart or by phone at [phone] option 4.     Wound Care Instructions  1. Cleanse wound gently with soap and water once a day then pat dry with clean gauze. Apply a thing coat of Petrolatum (petroleum jelly, "Vaseline") over the wound (unless you have an allergy to this). We recommend that you use a new, sterile tube of Vaseline. Do not pick or remove scabs. Do not remove the yellow or white "healing tissue" from the base of the wound.    2. Cover the wound with fresh, clean, nonstick gauze and secure with paper tape. You may use Band-Aids in place of gauze and tape if the would is small enough, but would recommend trimming much of the tape off as there is often too much. Sometimes Band-Aids can irritate the skin.  3. You should call the office for your biopsy report after 1 week if you have not already been contacted.  4. If you experience any problems, such as abnormal amounts of bleeding, swelling, significant bruising, significant pain,  or evidence of infection, please call the office immediately.  5. FOR ADULT SURGERY PATIENTS: If you need something for pain relief you may take 1 extra strength Tylenol (acetaminophen) AND 2 Ibuprofen (200mg each) together every 4 hours as needed for pain. (do not take these if you are allergic to them or if you have a reason you should not take them.) Typically, you may only need pain medication for 1 to 3 days.     

## 2021-03-25 ENCOUNTER — Other Ambulatory Visit: Payer: Medicare PPO

## 2021-03-25 ENCOUNTER — Other Ambulatory Visit: Payer: Self-pay

## 2021-03-25 ENCOUNTER — Encounter: Payer: Self-pay | Admitting: Dermatology

## 2021-03-25 ENCOUNTER — Telehealth: Payer: Self-pay

## 2021-03-25 DIAGNOSIS — E785 Hyperlipidemia, unspecified: Secondary | ICD-10-CM

## 2021-03-25 DIAGNOSIS — R7303 Prediabetes: Secondary | ICD-10-CM | POA: Diagnosis not present

## 2021-03-25 DIAGNOSIS — C4441 Basal cell carcinoma of skin of scalp and neck: Secondary | ICD-10-CM

## 2021-03-25 DIAGNOSIS — C44212 Basal cell carcinoma of skin of right ear and external auricular canal: Secondary | ICD-10-CM

## 2021-03-25 NOTE — Telephone Encounter (Signed)
Patient scheduled for [facility] to treat bx proven BCC at right neck and referral sent for [facility] at [facility] to treat right medial ear, JS

## 2021-03-25 NOTE — Telephone Encounter (Signed)
-----   Message from Alfonso Patten, MD sent at 03/25/2021  8:45 AM EDT ----- 1. Skin , right upper arm, shave VERRUCA VULGARIS  2. Skin , right neck, shave BASAL CELL CARCINOMA, NODULAR PATTERN, PIGMENTED --> ED&C  3. Skin , right medial ear, shave BASAL CELL CARCINOMA, NODULAR PATTERN, PIGMENTED --> [facility]  Dr. Jerilynn Mages discussed with patient.   MAs please call to schedule ED&C and refer for Mohs. Thank you!

## 2021-03-26 LAB — LIPID PANEL
Cholesterol: 136 mg/dL (ref <200)
HDL: 62 mg/dL (ref >=50)
LDL Cholesterol (Calc): 57 mg/dL (calc)
Non-HDL Cholesterol (Calc): 74 mg/dL (calc) (ref <130)
Total CHOL/HDL Ratio: 2.2 (calc) (ref <5.0)
Triglycerides: 88 mg/dL (ref <150)

## 2021-03-26 LAB — HEMOGLOBIN A1C
Hgb A1c MFr Bld: 6.3 % of total Hgb — ABNORMAL HIGH (ref <5.7)
Mean Plasma Glucose: 134 mg/dL
eAG (mmol/L): 7.4 mmol/L

## 2021-03-30 ENCOUNTER — Ambulatory Visit (INDEPENDENT_AMBULATORY_CARE_PROVIDER_SITE_OTHER): Payer: Medicare PPO

## 2021-03-30 DIAGNOSIS — I442 Atrioventricular block, complete: Secondary | ICD-10-CM

## 2021-03-30 LAB — CUP PACEART REMOTE DEVICE CHECK
Battery Remaining Longevity: 88 mo
Battery Voltage: 2.98 V
Brady Statistic AP VP Percent: 0.01 %
Brady Statistic AP VS Percent: 0 %
Brady Statistic AS VP Percent: 99.99 %
Brady Statistic AS VS Percent: 0.01 %
Brady Statistic RA Percent Paced: 0.01 %
Brady Statistic RV Percent Paced: 99.99 %
Date Time Interrogation Session: 20220418234722
Implantable Lead Implant Date: 20181009
Implantable Lead Implant Date: 20181009
Implantable Lead Location: 753859
Implantable Lead Location: 753860
Implantable Lead Model: 5076
Implantable Lead Model: 5076
Implantable Pulse Generator Implant Date: 20181009
Lead Channel Impedance Value: 266 Ohm
Lead Channel Impedance Value: 323 Ohm
Lead Channel Impedance Value: 361 Ohm
Lead Channel Impedance Value: 456 Ohm
Lead Channel Pacing Threshold Amplitude: 0.625 V
Lead Channel Pacing Threshold Amplitude: 1 V
Lead Channel Pacing Threshold Pulse Width: 0.4 ms
Lead Channel Pacing Threshold Pulse Width: 0.4 ms
Lead Channel Sensing Intrinsic Amplitude: 2.125 mV
Lead Channel Sensing Intrinsic Amplitude: 2.125 mV
Lead Channel Sensing Intrinsic Amplitude: 3.625 mV
Lead Channel Setting Pacing Amplitude: 1.5 V
Lead Channel Setting Pacing Amplitude: 2.5 V
Lead Channel Setting Pacing Pulse Width: 0.4 ms
Lead Channel Setting Sensing Sensitivity: 2 mV

## 2021-04-06 ENCOUNTER — Ambulatory Visit (INDEPENDENT_AMBULATORY_CARE_PROVIDER_SITE_OTHER): Payer: Medicare PPO | Admitting: Dermatology

## 2021-04-06 ENCOUNTER — Telehealth: Payer: Self-pay

## 2021-04-06 ENCOUNTER — Other Ambulatory Visit: Payer: Self-pay

## 2021-04-06 ENCOUNTER — Encounter: Payer: Self-pay | Admitting: Dermatology

## 2021-04-06 DIAGNOSIS — C44212 Basal cell carcinoma of skin of right ear and external auricular canal: Secondary | ICD-10-CM | POA: Diagnosis not present

## 2021-04-06 DIAGNOSIS — C4441 Basal cell carcinoma of skin of scalp and neck: Secondary | ICD-10-CM

## 2021-04-06 NOTE — Progress Notes (Signed)
   Follow-Up Visit   Subjective  Patricia [name] is a 70 y.o. female who presents for the following: Suture / Staple Removal (Suture removal of Biopsy proven BCC at R neck ).   The following portions of the chart were reviewed this encounter and updated as appropriate:   Tobacco  Allergies  Meds  Problems  Med Hx  Surg Hx  Fam Hx      Review of Systems:  No other skin or systemic complaints except as noted in HPI or Assessment and Plan.  Objective  Well appearing patient in no apparent distress; mood and affect are within normal limits.  A focused examination was performed including face, neck. Relevant physical exam findings are noted in the Assessment and Plan.  Objective  Neck - Anterior: Pink pearly papule or plaque with arborizing vessels.   Objective  Right Ear: Pink pearly plaque with arborizing vessels.    Assessment & Plan  Basal cell carcinoma (BCC) of skin of neck Neck - Anterior  Biopsy proven BCC discussed with pt, recommend EDC for treatment once site has healed from biopsy  Encounter for Removal of Sutures - Incision site at the right neck is clean, dry and intact - Wound cleansed, sutures removed, wound cleansed and steri strips applied.  - Discussed pathology results showing BCC   - Patient advised to keep steri-strips dry until they fall off. - Scars remodel for a full year. - Once steri-strips fall off, patient can apply over-the-counter silicone scar cream each night to help with scar remodeling if desired. - Patient advised to call with any concerns or if they notice any new or changing lesions.   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 Patient referred for Mohs surgery but has not gotten a call. We will call to confirm they have all paperwork they need.  Return for as scheduled May 31 for [facility] .  I, Marye Round, CMA, am acting as scribe for Forest Gleason, MD  .  Documentation: I have reviewed the above documentation for accuracy and completeness, and I agree with the above.  Forest Gleason, MD

## 2021-04-06 NOTE — Telephone Encounter (Signed)
Called pt discussed with her, [facility], so I faxed the referral over today, they should have her scheduled by Thursday someone from their office will call her with appt time and information, if she have not heard from them by the end of the week give [place] a call back

## 2021-04-08 DIAGNOSIS — C44212 Basal cell carcinoma of skin of right ear and external auricular canal: Secondary | ICD-10-CM | POA: Diagnosis not present

## 2021-04-08 DIAGNOSIS — C4441 Basal cell carcinoma of skin of scalp and neck: Secondary | ICD-10-CM | POA: Diagnosis not present

## 2021-04-15 NOTE — Progress Notes (Signed)
Remote pacemaker transmission.   

## 2021-05-11 ENCOUNTER — Ambulatory Visit: Payer: Medicare PPO | Admitting: Dermatology

## 2021-05-27 DIAGNOSIS — C44319 Basal cell carcinoma of skin of other parts of face: Secondary | ICD-10-CM | POA: Diagnosis not present

## 2021-06-02 ENCOUNTER — Encounter: Payer: Medicare PPO | Admitting: Dermatology

## 2021-06-29 ENCOUNTER — Ambulatory Visit (INDEPENDENT_AMBULATORY_CARE_PROVIDER_SITE_OTHER): Payer: Medicare PPO

## 2021-06-29 DIAGNOSIS — I442 Atrioventricular block, complete: Secondary | ICD-10-CM

## 2021-06-29 LAB — CUP PACEART REMOTE DEVICE CHECK
Battery Remaining Longevity: 81 mo
Battery Voltage: 2.98 V
Brady Statistic AP VP Percent: 0.01 %
Brady Statistic AP VS Percent: 0 %
Brady Statistic AS VP Percent: 99.97 %
Brady Statistic AS VS Percent: 0.02 %
Brady Statistic RA Percent Paced: 0.01 %
Brady Statistic RV Percent Paced: 99.98 %
Date Time Interrogation Session: 20220718203815
Implantable Lead Implant Date: 20181009
Implantable Lead Implant Date: 20181009
Implantable Lead Location: 753859
Implantable Lead Location: 753860
Implantable Lead Model: 5076
Implantable Lead Model: 5076
Implantable Pulse Generator Implant Date: 20181009
Lead Channel Impedance Value: 266 Ohm
Lead Channel Impedance Value: 304 Ohm
Lead Channel Impedance Value: 361 Ohm
Lead Channel Impedance Value: 380 Ohm
Lead Channel Pacing Threshold Amplitude: 0.625 V
Lead Channel Pacing Threshold Amplitude: 0.875 V
Lead Channel Pacing Threshold Pulse Width: 0.4 ms
Lead Channel Pacing Threshold Pulse Width: 0.4 ms
Lead Channel Sensing Intrinsic Amplitude: 2.25 mV
Lead Channel Sensing Intrinsic Amplitude: 2.25 mV
Lead Channel Sensing Intrinsic Amplitude: 3.625 mV
Lead Channel Setting Pacing Amplitude: 1.5 V
Lead Channel Setting Pacing Amplitude: 2.5 V
Lead Channel Setting Pacing Pulse Width: 0.4 ms
Lead Channel Setting Sensing Sensitivity: 2 mV

## 2021-07-06 DIAGNOSIS — S9032XA Contusion of left foot, initial encounter: Secondary | ICD-10-CM | POA: Diagnosis not present

## 2021-07-08 ENCOUNTER — Other Ambulatory Visit: Payer: Self-pay | Admitting: Cardiology

## 2021-07-14 ENCOUNTER — Telehealth: Payer: Self-pay | Admitting: Cardiology

## 2021-07-14 MED ORDER — METOPROLOL TARTRATE 100 MG PO TABS: 100.0000 mg | ORAL_TABLET | Freq: Two times a day (BID) | ORAL | 3 refills | Status: DC

## 2021-07-14 NOTE — Telephone Encounter (Signed)
*  STAT* If patient is at the pharmacy, call can be transferred to refill team.   1. Which medications need to be refilled? (please list name of each medication and dose if known) metoprolol tartrate (LOPRESSOR) 100 MG tablet  2. Which pharmacy/location (including street and city if local pharmacy) is medication to be sent to? [place], [place]  3. Do they need a 30 day or 90 day supply?  90 day supply    Almost of of medication.

## 2021-07-21 ENCOUNTER — Ambulatory Visit: Payer: Medicare PPO | Admitting: Dermatology

## 2021-07-21 ENCOUNTER — Other Ambulatory Visit: Payer: Self-pay

## 2021-07-21 DIAGNOSIS — D229 Melanocytic nevi, unspecified: Secondary | ICD-10-CM | POA: Diagnosis not present

## 2021-07-21 DIAGNOSIS — L578 Other skin changes due to chronic exposure to nonionizing radiation: Secondary | ICD-10-CM

## 2021-07-21 DIAGNOSIS — C4491 Basal cell carcinoma of skin, unspecified: Secondary | ICD-10-CM

## 2021-07-21 DIAGNOSIS — D1800 Hemangioma unspecified site: Secondary | ICD-10-CM | POA: Diagnosis not present

## 2021-07-21 DIAGNOSIS — D492 Neoplasm of unspecified behavior of bone, soft tissue, and skin: Secondary | ICD-10-CM

## 2021-07-21 DIAGNOSIS — Z85828 Personal history of other malignant neoplasm of skin: Secondary | ICD-10-CM | POA: Diagnosis not present

## 2021-07-21 DIAGNOSIS — L814 Other melanin hyperpigmentation: Secondary | ICD-10-CM

## 2021-07-21 DIAGNOSIS — L309 Dermatitis, unspecified: Secondary | ICD-10-CM | POA: Diagnosis not present

## 2021-07-21 DIAGNOSIS — Z1283 Encounter for screening for malignant neoplasm of skin: Secondary | ICD-10-CM | POA: Diagnosis not present

## 2021-07-21 DIAGNOSIS — C44519 Basal cell carcinoma of skin of other part of trunk: Secondary | ICD-10-CM | POA: Diagnosis not present

## 2021-07-21 DIAGNOSIS — L821 Other seborrheic keratosis: Secondary | ICD-10-CM | POA: Diagnosis not present

## 2021-07-21 HISTORY — DX: Basal cell carcinoma of skin, unspecified: C44.91

## 2021-07-21 MED ORDER — TRIAMCINOLONE ACETONIDE 0.1 % EX CREA: TOPICAL_CREAM | CUTANEOUS | 1 refills | Status: DC

## 2021-07-21 NOTE — Progress Notes (Signed)
Follow-Up Visit   Subjective  Patricia [name] is a 70 y.o. female who presents for the following: Follow-up (Patient here today for tbse. Patient has history of bcc at neck in April 2022. Patient denies any new concerns today. ).  Patient here for full body skin exam and skin cancer screening.  The following portions of the chart were reviewed this encounter and updated as appropriate:  Tobacco  Allergies  Meds  Problems  Med Hx  Surg Hx  Fam Hx      Objective  Well appearing patient in no apparent distress; mood and affect are within normal limits.  A full examination was performed including scalp, head, eyes, ears, nose, lips, neck, chest, axillae, abdomen, back, buttocks, bilateral upper extremities, bilateral lower extremities, hands, feet, fingers, toes, fingernails, and toenails. All findings within normal limits unless otherwise noted below.  Right elbow and right shoulder Scaly pink papules coalescing to plaques  right mid back 1.1 cm scaly pink plaque adjacent to scar       Assessment & Plan  Eczema, unspecified type Right elbow and right shoulder  Gentle Skin Care Guide given   Start Tmc 0.1 cream use twice daily up to 2 weeks for itchy rash at right elow and right shoulder. Avoid applying to face, groin, and axilla. Use as directed. Risk of skin atrophy with long-term use reviewed.   Patient instructed to call if rash is not resolved in 2 weeks.   Topical steroids (such as triamcinolone, fluocinolone, fluocinonide, mometasone, clobetasol, halobetasol, betamethasone, hydrocortisone) can cause thinning and lightening of the skin if they are used for too long in the same area. Your physician has selected the right strength medicine for your problem and area affected on the body. Please use your medication only as directed by your physician to prevent side effects.     triamcinolone cream (KENALOG) 0.1 % - Right elbow and right shoulder Apply topically to  affected itchy rash areas twice daily for 2 weeks. Avoid applying to face, groin, and axilla. Use as directed.  Neoplasm of skin right mid back  Skin / nail biopsy Type of biopsy: tangential   Informed consent: discussed and consent obtained   Timeout: patient name, date of birth, surgical site, and procedure verified   Patient was prepped and draped in usual sterile fashion: Area prepped with isopropyl alcohol. Anesthesia: the lesion was anesthetized in a standard fashion   Anesthetic:  1% lidocaine w/ epinephrine 1-100,000 buffered w/ 8.4% NaHCO3 Instrument used: flexible razor blade   Hemostasis achieved with: aluminum chloride   Outcome: patient tolerated procedure well   Post-procedure details: wound care instructions given   Additional details:  Mupirocin and a bandage applied  Specimen 1 - Surgical pathology Differential Diagnosis:R/o recurrent bcc vs isk  Check Margins: No  R/o recurrent bcc vs isk  Related Medications mupirocin ointment (BACTROBAN) 2 % Apply 1 application topically daily. Qd to biopsy sites Lentigines - Scattered tan macules - Due to sun exposure - Benign-appering, observe - Recommend daily broad spectrum sunscreen SPF 30+ to sun-exposed areas, reapply every 2 hours as needed. - Call for any changes  Seborrheic Keratoses - Stuck-on, waxy, tan-brown papules and/or plaques  - Benign-appearing - Discussed benign etiology and prognosis. - Observe - Call for any changes  Melanocytic Nevi - Tan-brown and/or pink-flesh-colored symmetric macules and papules - Benign appearing on exam today - Observation - Call clinic for new or changing moles - Recommend daily use of broad spectrum spf  30+ sunscreen to sun-exposed areas.   Hemangiomas - Red papules - Discussed benign nature - Observe - Call for any changes  Actinic Damage - Chronic condition, secondary to cumulative UV/sun exposure - diffuse scaly erythematous macules with underlying  dyspigmentation - Recommend daily broad spectrum sunscreen SPF 30+ to sun-exposed areas, reapply every 2 hours as needed.  - Staying in the shade or wearing long sleeves, sun glasses (UVA+UVB protection) and wide brim hats (4-inch brim around the entire circumference of the hat) are also recommended for sun protection.  - Call for new or changing lesions.  History of Basal Cell Carcinoma of the Skin - No evidence of recurrence today right medial ear (mohs 2022) , right neck 2022, and right lower back (2020) - Recommend regular full body skin exams - Recommend daily broad spectrum sunscreen SPF 30+ to sun-exposed areas, reapply every 2 hours as needed.  - Call if any new or changing lesions are noted between office visits  Skin cancer screening performed today. Return in about 6 months (around 01/21/2022) for tbse. I, Ruthell Rummage, CMA, am acting as scribe for Forest Gleason, MD.  Documentation: I have reviewed the above documentation for accuracy and completeness, and I agree with the above.  Forest Gleason, MD

## 2021-07-21 NOTE — Patient Instructions (Addendum)
Biopsy Wound Care Instructions  Leave the original bandage on for 24 hours if possible.  If the bandage becomes soaked or soiled before that time, it is OK to remove it and examine the wound.  A small amount of post-operative bleeding is normal.  If excessive bleeding occurs, remove the bandage, place gauze over the site and apply continuous pressure (no peeking) over the area for 30 minutes. If this does not work, please call our clinic as soon as possible or page your doctor if it is after hours.   Once a day, cleanse the wound with soap and water. It is fine to shower. If a thick crust develops you may use a Q-tip dipped into dilute hydrogen peroxide (mix 1:1 with water) to dissolve it.  Hydrogen peroxide can slow the healing process, so use it only as needed.    After washing, apply petroleum jelly (Vaseline) or an antibiotic ointment if your doctor prescribed one for you, followed by a bandage.    For best healing, the wound should be covered with a layer of ointment at all times. If you are not able to keep the area covered with a bandage to hold the ointment in place, this may mean re-applying the ointment several times a day.  Continue this wound care until the wound has healed and is no longer open.   Itching and mild discomfort is normal during the healing process. However, if you develop pain or severe itching, please call our office.   If you have any discomfort, you can take Tylenol (acetaminophen) or ibuprofen as directed on the bottle. (Please do not take these if you have an allergy to them or cannot take them for another reason).  Some redness, tenderness and white or yellow material in the wound is normal healing.  If the area becomes very sore and red, or develops a thick yellow-green material (pus), it may be infected; please notify us.    If you have stitches, return to clinic as directed to have the stitches removed. You will continue wound care for 2-3 days after the stitches  are removed.   Wound healing continues for up to one year following surgery. It is not unusual to experience pain in the scar from time to time during the interval.  If the pain becomes severe or the scar thickens, you should notify the office.    A slight amount of redness in a scar is expected for the first six months.  After six months, the redness will fade and the scar will soften and fade.  The color difference becomes less noticeable with time.  If there are any problems, return for a post-op surgery check at your earliest convenience.  To improve the appearance of the scar, you can use silicone scar gel, cream, or sheets (such as Mederma or Serica) every night for up to one year. These are available over the counter (without a prescription).  Please call our office at [phone] for any questions or concerns.    Recommend Serica moisturizing scar formula cream every night or [facility] brand or Mederma silicone scar sheet every night for the first year after a scar appears to help with scar remodeling if desired. Scars remodel on their own for a full year.   Gentle Skin Care Guide  1. Bathe no more than once a day.  2. Avoid bathing in hot water  3. Use a mild soap like Dove, Vanicream, Cetaphil, CeraVe. Can use Lever 2000 or Cetaphil antibacterial  soap  4. Use soap only where you need it. On most days, use it under your arms, between your legs, and on your feet. Let the water rinse other areas unless visibly dirty.  5. When you get out of the bath/shower, use a towel to gently blot your skin dry, don't rub it.  6. While your skin is still a little damp, apply a moisturizing cream such as Vanicream, CeraVe, Cetaphil, Eucerin, Sarna lotion or plain Vaseline Jelly. For hands apply Neutrogena [place] Hand Cream or Excipial Hand Cream.  7. Reapply moisturizer any time you start to itch or feel dry.  8. Sometimes using free and clear laundry detergents can be helpful. Fabric  softener sheets should be avoided. Downy Free & Gentle liquid, or any liquid fabric softener that is free of dyes and perfumes, it acceptable to use  9. If your doctor has given you prescription creams you may apply moisturizers over them    Topical steroids (such as triamcinolone, fluocinolone, fluocinonide, mometasone, clobetasol, halobetasol, betamethasone, hydrocortisone) can cause thinning and lightening of the skin if they are used for too long in the same area. Your physician has selected the right strength medicine for your problem and area affected on the body. Please use your medication only as directed by your physician to prevent side effects.      Melanoma ABCDEs  Melanoma is the most dangerous type of skin cancer, and is the leading cause of death from skin disease.  You are more likely to develop melanoma if you: Have light-colored skin, light-colored eyes, or red or blond hair Spend a lot of time in the sun Tan regularly, either outdoors or in a tanning bed Have had blistering sunburns, especially during childhood Have a close family member who has had a melanoma Have atypical moles or large birthmarks  Early detection of melanoma is key since treatment is typically straightforward and cure rates are extremely high if we catch it early.   The first sign of melanoma is often a change in a mole or a new dark spot.  The ABCDE system is a way of remembering the signs of melanoma.  A for asymmetry:  The two halves do not match. B for border:  The edges of the growth are irregular. C for color:  A mixture of colors are present instead of an even brown color. D for diameter:  Melanomas are usually (but not always) greater than 43m - the size of a pencil eraser. E for evolution:  The spot keeps changing in size, shape, and color.  Please check your skin once per month between visits. You can use a small mirror in front and a large mirror behind you to keep an eye on the back  side or your body.   If you see any new or changing lesions before your next follow-up, please call to schedule a visit.  Please continue daily skin protection including broad spectrum sunscreen SPF 30+ to sun-exposed areas, reapplying every 2 hours as needed when you're outdoors.   Staying in the shade or wearing long sleeves, sun glasses (UVA+UVB protection) and wide brim hats (4-inch brim around the entire circumference of the hat) are also recommended for sun protection.    Recommend taking Heliocare sun protection supplement daily in sunny weather for additional sun protection. For maximum protection on the sunniest days, you can take up to 2 capsules of regular Heliocare OR take 1 capsule of Heliocare Ultra. For prolonged exposure (such as a full  day in the sun), you can repeat your dose of the supplement 4 hours after your first dose. Heliocare can be purchased at [facility] or at [email or web address].    If you have any questions or concerns for your doctor, please call our main line at [phone] and press option 4 to reach your doctor's medical assistant. If no one answers, please leave a voicemail as directed and we will return your call as soon as possible. Messages left after 4 pm will be answered the following business day.   You may also send [place] a message via [place]. We typically respond to MyChart messages within 1-2 business days.  For prescription refills, please ask your pharmacy to contact our office. Our fax number is [phone].  If you have an urgent issue when the clinic is closed that cannot wait until the next business day, you can page your doctor at the number below.    Please note that while we do our best to be available for urgent issues outside of office hours, we are not available 24/7.   If you have an urgent issue and are unable to reach [place], you may choose to seek medical care at your doctor's office, retail clinic, urgent care center, or emergency  room.  If you have a medical emergency, please immediately call 911 or go to the emergency department.  Pager Numbers  - Dr. Nehemiah Massed: [phone]  - Dr. Laurence Ferrari: [phone]  - Dr. Nicole Kindred: [phone]  In the event of inclement weather, please call our main line at [phone] for an update on the status of any delays or closures.  Dermatology Medication Tips: Please keep the boxes that topical medications come in in order to help keep track of the instructions about where and how to use these. Pharmacies typically print the medication instructions only on the boxes and not directly on the medication tubes.   If your medication is too expensive, please contact our office at [phone] option 4 or send [place] a message through [place].   We are unable to tell what your co-pay for medications will be in advance as this is different depending on your insurance coverage. However, we may be able to find a substitute medication at lower cost or fill out paperwork to get insurance to cover a needed medication.   If a prior authorization is required to get your medication covered by your insurance company, please allow [place] 1-2 business days to complete this process.  Drug prices often vary depending on where the prescription is filled and some pharmacies may offer cheaper prices.  The website www.goodrx.com contains coupons for medications through different pharmacies. The prices here do not account for what the cost may be with help from insurance (it may be cheaper with your insurance), but the website can give you the price if you did not use any insurance.  - You can print the associated coupon and take it with your prescription to the pharmacy.  - You may also stop by our office during regular business hours and pick up a GoodRx coupon card.  - If you need your prescription sent electronically to a different pharmacy, notify our office through [facility] or by phone at [phone]  option 4.

## 2021-07-22 NOTE — Progress Notes (Signed)
Remote pacemaker transmission.   

## 2021-07-27 ENCOUNTER — Telehealth: Payer: Self-pay

## 2021-07-27 NOTE — Telephone Encounter (Signed)
Patient advised of BX results and scheduled in the first of October for treatment.  She also states you referred her to the [facility] for her neck.

## 2021-07-27 NOTE — Telephone Encounter (Signed)
Ok, thank you.  I actually did not refer her to the [place] for the [facility] on her neck. I would have excised or treated with ED&C in the office for this. I referred her for the [facility] on her ear, but it looks like they treated her neck as well, which is fine. I confirmed in her chart this was treated.  Thank you

## 2021-07-27 NOTE — Telephone Encounter (Signed)
-----   Message from [place], MD sent at 07/27/2021 11:46 AM EDT ----- Skin , right mid back SUPERFICIAL BASAL CELL CARCINOMA, PERIPHERAL MARGIN INVOLVED --> ED&C  Also on review it looks like we never completed [facility] for the [facility] at her neck from 03/2021. Recommend ED&C to destroy any remaining "roots" of this [place] as well.  MAs please call and schedule within next 1-2 months. Thank you!

## 2021-07-31 ENCOUNTER — Encounter: Payer: Self-pay | Admitting: Dermatology

## 2021-08-02 DIAGNOSIS — Z1231 Encounter for screening mammogram for malignant neoplasm of breast: Secondary | ICD-10-CM | POA: Diagnosis not present

## 2021-08-11 DIAGNOSIS — H401132 Primary open-angle glaucoma, bilateral, moderate stage: Secondary | ICD-10-CM | POA: Diagnosis not present

## 2021-08-23 ENCOUNTER — Other Ambulatory Visit (HOSPITAL_COMMUNITY): Payer: Self-pay | Admitting: Orthopedic Surgery

## 2021-08-23 DIAGNOSIS — M25512 Pain in left shoulder: Secondary | ICD-10-CM | POA: Diagnosis not present

## 2021-08-23 DIAGNOSIS — S83241A Other tear of medial meniscus, current injury, right knee, initial encounter: Secondary | ICD-10-CM

## 2021-08-23 DIAGNOSIS — M25511 Pain in right shoulder: Secondary | ICD-10-CM | POA: Diagnosis not present

## 2021-08-23 DIAGNOSIS — M25561 Pain in right knee: Secondary | ICD-10-CM | POA: Diagnosis not present

## 2021-08-30 DIAGNOSIS — S46011D Strain of muscle(s) and tendon(s) of the rotator cuff of right shoulder, subsequent encounter: Secondary | ICD-10-CM | POA: Diagnosis not present

## 2021-08-30 DIAGNOSIS — M6281 Muscle weakness (generalized): Secondary | ICD-10-CM | POA: Diagnosis not present

## 2021-08-30 DIAGNOSIS — M25611 Stiffness of right shoulder, not elsewhere classified: Secondary | ICD-10-CM | POA: Diagnosis not present

## 2021-08-30 DIAGNOSIS — S46012D Strain of muscle(s) and tendon(s) of the rotator cuff of left shoulder, subsequent encounter: Secondary | ICD-10-CM | POA: Diagnosis not present

## 2021-09-01 DIAGNOSIS — S46012D Strain of muscle(s) and tendon(s) of the rotator cuff of left shoulder, subsequent encounter: Secondary | ICD-10-CM | POA: Diagnosis not present

## 2021-09-01 DIAGNOSIS — M6281 Muscle weakness (generalized): Secondary | ICD-10-CM | POA: Diagnosis not present

## 2021-09-01 DIAGNOSIS — M25611 Stiffness of right shoulder, not elsewhere classified: Secondary | ICD-10-CM | POA: Diagnosis not present

## 2021-09-01 DIAGNOSIS — M25612 Stiffness of left shoulder, not elsewhere classified: Secondary | ICD-10-CM | POA: Diagnosis not present

## 2021-09-06 DIAGNOSIS — M25611 Stiffness of right shoulder, not elsewhere classified: Secondary | ICD-10-CM | POA: Diagnosis not present

## 2021-09-06 DIAGNOSIS — M25612 Stiffness of left shoulder, not elsewhere classified: Secondary | ICD-10-CM | POA: Diagnosis not present

## 2021-09-06 DIAGNOSIS — S46012D Strain of muscle(s) and tendon(s) of the rotator cuff of left shoulder, subsequent encounter: Secondary | ICD-10-CM | POA: Diagnosis not present

## 2021-09-06 DIAGNOSIS — M6281 Muscle weakness (generalized): Secondary | ICD-10-CM | POA: Diagnosis not present

## 2021-09-13 DIAGNOSIS — M25612 Stiffness of left shoulder, not elsewhere classified: Secondary | ICD-10-CM | POA: Diagnosis not present

## 2021-09-13 DIAGNOSIS — M6281 Muscle weakness (generalized): Secondary | ICD-10-CM | POA: Diagnosis not present

## 2021-09-13 DIAGNOSIS — M25611 Stiffness of right shoulder, not elsewhere classified: Secondary | ICD-10-CM | POA: Diagnosis not present

## 2021-09-13 DIAGNOSIS — S46012D Strain of muscle(s) and tendon(s) of the rotator cuff of left shoulder, subsequent encounter: Secondary | ICD-10-CM | POA: Diagnosis not present

## 2021-09-15 ENCOUNTER — Other Ambulatory Visit: Payer: Self-pay

## 2021-09-15 ENCOUNTER — Ambulatory Visit: Payer: Medicare PPO | Admitting: Dermatology

## 2021-09-15 DIAGNOSIS — C44519 Basal cell carcinoma of skin of other part of trunk: Secondary | ICD-10-CM

## 2021-09-15 DIAGNOSIS — D492 Neoplasm of unspecified behavior of bone, soft tissue, and skin: Secondary | ICD-10-CM

## 2021-09-15 DIAGNOSIS — M25612 Stiffness of left shoulder, not elsewhere classified: Secondary | ICD-10-CM | POA: Diagnosis not present

## 2021-09-15 DIAGNOSIS — S46012D Strain of muscle(s) and tendon(s) of the rotator cuff of left shoulder, subsequent encounter: Secondary | ICD-10-CM | POA: Diagnosis not present

## 2021-09-15 DIAGNOSIS — M6281 Muscle weakness (generalized): Secondary | ICD-10-CM | POA: Diagnosis not present

## 2021-09-15 DIAGNOSIS — M25611 Stiffness of right shoulder, not elsewhere classified: Secondary | ICD-10-CM | POA: Diagnosis not present

## 2021-09-15 DIAGNOSIS — C44612 Basal cell carcinoma of skin of right upper limb, including shoulder: Secondary | ICD-10-CM

## 2021-09-15 NOTE — Patient Instructions (Signed)
Electrodesiccation and Curettage ("Scrape and Burn") Wound Care Instructions  Leave the original bandage on for 24 hours if possible.  If the bandage becomes soaked or soiled before that time, it is OK to remove it and examine the wound.  A small amount of post-operative bleeding is normal.  If excessive bleeding occurs, remove the bandage, place gauze over the site and apply continuous pressure (no peeking) over the area for 30 minutes. If this does not work, please call our clinic as soon as possible or page your doctor if it is after hours.   Once a day, cleanse the wound with soap and water. It is fine to shower. If a thick crust develops you may use a Q-tip dipped into dilute hydrogen peroxide (mix 1:1 with water) to dissolve it.  Hydrogen peroxide can slow the healing process, so use it only as needed.    After washing, apply petroleum jelly (Vaseline) or an antibiotic ointment if your doctor prescribed one for you, followed by a bandage.    For best healing, the wound should be covered with a layer of ointment at all times. If you are not able to keep the area covered with a bandage to hold the ointment in place, this may mean re-applying the ointment several times a day.  Continue this wound care until the wound has healed and is no longer open. It may take several weeks for the wound to heal and close.  Itching and mild discomfort is normal during the healing process.  If you have any discomfort, you can take Tylenol (acetaminophen) or ibuprofen as directed on the bottle. (Please do not take these if you have an allergy to them or cannot take them for another reason).  Some redness, tenderness and white or yellow material in the wound is normal healing.  If the area becomes very sore and red, or develops a thick yellow-green material (pus), it may be infected; please notify us.    Wound healing continues for up to one year following surgery. It is not unusual to experience pain in the scar  from time to time during the interval.  If the pain becomes severe or the scar thickens, you should notify the office.    A slight amount of redness in a scar is expected for the first six months.  After six months, the redness will fade and the scar will soften and fade.  The color difference becomes less noticeable with time.  If there are any problems, return for a post-op surgery check at your earliest convenience.  To improve the appearance of the scar, you can use silicone scar gel, cream, or sheets (such as Mederma or Serica) every night for up to one year. These are available over the counter (without a prescription).  Please call our office at [phone] for any questions or concerns.  Melanoma ABCDEs  Melanoma is the most dangerous type of skin cancer, and is the leading cause of death from skin disease.  You are more likely to develop melanoma if you: Have light-colored skin, light-colored eyes, or red or blond hair Spend a lot of time in the sun Tan regularly, either outdoors or in a tanning bed Have had blistering sunburns, especially during childhood Have a close family member who has had a melanoma Have atypical moles or large birthmarks  Early detection of melanoma is key since treatment is typically straightforward and cure rates are extremely high if we catch it early.   The first sign  of melanoma is often a change in a mole or a new dark spot.  The ABCDE system is a way of remembering the signs of melanoma.  A for asymmetry:  The two halves do not match. B for border:  The edges of the growth are irregular. C for color:  A mixture of colors are present instead of an even brown color. D for diameter:  Melanomas are usually (but not always) greater than 32mm - the size of a pencil eraser. E for evolution:  The spot keeps changing in size, shape, and color.  Please check your skin once per month between visits. You can use a small mirror in front and a large mirror  behind you to keep an eye on the back side or your body.   If you see any new or changing lesions before your next follow-up, please call to schedule a visit.  Please continue daily skin protection including broad spectrum sunscreen SPF 30+ to sun-exposed areas, reapplying every 2 hours as needed when you're outdoors.    If you have any questions or concerns for your doctor, please call our main line at [phone] and press option 4 to reach your doctor's medical assistant. If no one answers, please leave a voicemail as directed and we will return your call as soon as possible. Messages left after 4 pm will be answered the following business day.   You may also send [place] a message via [place]. We typically respond to MyChart messages within 1-2 business days.  For prescription refills, please ask your pharmacy to contact our office. Our fax number is [phone].  If you have an urgent issue when the clinic is closed that cannot wait until the next business day, you can page your doctor at the number below.    Please note that while we do our best to be available for urgent issues outside of office hours, we are not available 24/7.   If you have an urgent issue and are unable to reach [place], you may choose to seek medical care at your doctor's office, retail clinic, urgent care center, or emergency room.  If you have a medical emergency, please immediately call 911 or go to the emergency department.  Pager Numbers  - Dr. Nehemiah Massed: [phone]  - Dr. Laurence Ferrari: [phone]  - Dr. Nicole Kindred: [phone]  In the event of inclement weather, please call our main line at [phone] for an update on the status of any delays or closures.  Dermatology Medication Tips: Please keep the boxes that topical medications come in in order to help keep track of the instructions about where and how to use these. Pharmacies typically print the medication instructions only on the boxes and not directly on the  medication tubes.   If your medication is too expensive, please contact our office at [phone] option 4 or send [place] a message through [place].   We are unable to tell what your co-pay for medications will be in advance as this is different depending on your insurance coverage. However, we may be able to find a substitute medication at lower cost or fill out paperwork to get insurance to cover a needed medication.   If a prior authorization is required to get your medication covered by your insurance company, please allow [place] 1-2 business days to complete this process.  Drug prices often vary depending on where the prescription is filled and some pharmacies may offer cheaper prices.  The website www.goodrx.com contains coupons for medications  through different pharmacies. The prices here do not account for what the cost may be with help from insurance (it may be cheaper with your insurance), but the website can give you the price if you did not use any insurance.  - You can print the associated coupon and take it with your prescription to the pharmacy.  - You may also stop by our office during regular business hours and pick up a GoodRx coupon card.  - If you need your prescription sent electronically to a different pharmacy, notify our office through [facility] or by phone at [phone] option 4.

## 2021-09-15 NOTE — Progress Notes (Signed)
   Follow-Up Visit   Subjective  Patricia [name] is a 70 y.o. female who presents for the following: Follow-up (Patient here today for [facility] for a spot at back. ).   The following portions of the chart were reviewed this encounter and updated as appropriate:  Tobacco  Allergies  Meds  Problems  Med Hx  Surg Hx  Fam Hx      Review of Systems: No other skin or systemic complaints except as noted in HPI or Assessment and Plan.   Objective  Well appearing patient in no apparent distress; mood and affect are within normal limits.  A focused examination was performed including back. Relevant physical exam findings are noted in the Assessment and Plan.  right mid back Pink plaque  Assessment & Plan  Neoplasm of skin  Related Medications mupirocin ointment (BACTROBAN) 2 % Apply 1 application topically daily. Qd to biopsy sites  Basal cell carcinoma (BCC) of skin of trunk right mid back  Destruction of lesion  Destruction method: electrodesiccation and curettage   Informed consent: discussed and consent obtained   Timeout:  patient name, date of birth, surgical site, and procedure verified Patient was prepped and draped in usual sterile fashion: area prepped with isopropyl alcohol. Anesthesia: the lesion was anesthetized in a standard fashion   Anesthetic:  1% lidocaine w/ epinephrine 1-100,000 buffered w/ 8.4% NaHCO3 Curettage performed in three different directions: Yes   Electrodesiccation performed over the curetted area: Yes   Curettage cycles:  3 Lesion length (cm):  1.6 Lesion width (cm):  1.6 Margin per side (cm):  0.2 Final wound size (cm):  2 Hemostasis achieved with:  electrodesiccation Outcome: patient tolerated procedure well with no complications   Post-procedure details: wound care instructions given   Additional details:  Mupirocin and a pressure dressing applied  Biopsy proven SUPERFICIAL BASAL CELL CARCINOMA, PERIPHERAL MARGIN INVOLVED at right mid  back  Accession: [id number]  Patient prefers [facility] treatment    Return for keep follow up as scheduled .  I, Ruthell Rummage, CMA, am acting as scribe for Forest Gleason, MD.  Documentation: I have reviewed the above documentation for accuracy and completeness, and I agree with the above.  Forest Gleason, MD

## 2021-09-20 DIAGNOSIS — S46012D Strain of muscle(s) and tendon(s) of the rotator cuff of left shoulder, subsequent encounter: Secondary | ICD-10-CM | POA: Diagnosis not present

## 2021-09-20 DIAGNOSIS — M25611 Stiffness of right shoulder, not elsewhere classified: Secondary | ICD-10-CM | POA: Diagnosis not present

## 2021-09-20 DIAGNOSIS — M6281 Muscle weakness (generalized): Secondary | ICD-10-CM | POA: Diagnosis not present

## 2021-09-20 DIAGNOSIS — M25612 Stiffness of left shoulder, not elsewhere classified: Secondary | ICD-10-CM | POA: Diagnosis not present

## 2021-09-22 DIAGNOSIS — M25611 Stiffness of right shoulder, not elsewhere classified: Secondary | ICD-10-CM | POA: Diagnosis not present

## 2021-09-22 DIAGNOSIS — S46012D Strain of muscle(s) and tendon(s) of the rotator cuff of left shoulder, subsequent encounter: Secondary | ICD-10-CM | POA: Diagnosis not present

## 2021-09-22 DIAGNOSIS — M6281 Muscle weakness (generalized): Secondary | ICD-10-CM | POA: Diagnosis not present

## 2021-09-22 DIAGNOSIS — M25612 Stiffness of left shoulder, not elsewhere classified: Secondary | ICD-10-CM | POA: Diagnosis not present

## 2021-09-27 ENCOUNTER — Encounter: Payer: Self-pay | Admitting: Dermatology

## 2021-09-27 DIAGNOSIS — M25611 Stiffness of right shoulder, not elsewhere classified: Secondary | ICD-10-CM | POA: Diagnosis not present

## 2021-09-27 DIAGNOSIS — S46012D Strain of muscle(s) and tendon(s) of the rotator cuff of left shoulder, subsequent encounter: Secondary | ICD-10-CM | POA: Diagnosis not present

## 2021-09-27 DIAGNOSIS — M25612 Stiffness of left shoulder, not elsewhere classified: Secondary | ICD-10-CM | POA: Diagnosis not present

## 2021-09-27 DIAGNOSIS — M6281 Muscle weakness (generalized): Secondary | ICD-10-CM | POA: Diagnosis not present

## 2021-09-28 ENCOUNTER — Other Ambulatory Visit: Payer: Self-pay

## 2021-09-28 ENCOUNTER — Ambulatory Visit (INDEPENDENT_AMBULATORY_CARE_PROVIDER_SITE_OTHER): Payer: Medicare PPO

## 2021-09-28 ENCOUNTER — Ambulatory Visit (HOSPITAL_COMMUNITY)
Admission: RE | Admit: 2021-09-28 | Discharge: 2021-09-28 | Disposition: A | Discharge status: Home / Self Care | Payer: Medicare PPO | Source: Ambulatory Visit | Attending: Orthopedic Surgery | Admitting: Orthopedic Surgery

## 2021-09-28 DIAGNOSIS — M1711 Unilateral primary osteoarthritis, right knee: Secondary | ICD-10-CM | POA: Diagnosis not present

## 2021-09-28 DIAGNOSIS — M7121 Synovial cyst of popliteal space [Baker], right knee: Secondary | ICD-10-CM | POA: Diagnosis not present

## 2021-09-28 DIAGNOSIS — S83241A Other tear of medial meniscus, current injury, right knee, initial encounter: Secondary | ICD-10-CM | POA: Insufficient documentation

## 2021-09-28 DIAGNOSIS — I442 Atrioventricular block, complete: Secondary | ICD-10-CM | POA: Diagnosis not present

## 2021-09-28 NOTE — Progress Notes (Signed)
Per order, Changed device settings for MRI to DOO at 95 bpm   Tachy-therapies to off if applicable.   Will program device back to pre-MRI settings after completion of exam.

## 2021-09-28 NOTE — Progress Notes (Signed)
Informed of MRI for today.   Device system confirmed to be MRI conditional, with implant date > 6 weeks ago, and no evidence of abandoned or epicardial leads in review of most recent CXR Interrogation from today reviewed, pt is currently AS-VP at 96 bpm  Comfirmed with RN currently HRs have slowed to ~90.   Change device settings for MRI to DOO at 95 bpm  Tachy-therapies to off if applicable.  Program device back to pre-MRI settings after completion of exam.  Annamaria Helling  09/28/2021 12:51 PM

## 2021-09-29 ENCOUNTER — Other Ambulatory Visit: Payer: Self-pay | Admitting: Cardiology

## 2021-09-29 DIAGNOSIS — M6281 Muscle weakness (generalized): Secondary | ICD-10-CM | POA: Diagnosis not present

## 2021-09-29 DIAGNOSIS — M25611 Stiffness of right shoulder, not elsewhere classified: Secondary | ICD-10-CM | POA: Diagnosis not present

## 2021-09-29 DIAGNOSIS — S46012D Strain of muscle(s) and tendon(s) of the rotator cuff of left shoulder, subsequent encounter: Secondary | ICD-10-CM | POA: Diagnosis not present

## 2021-09-29 DIAGNOSIS — M25612 Stiffness of left shoulder, not elsewhere classified: Secondary | ICD-10-CM | POA: Diagnosis not present

## 2021-09-29 LAB — CUP PACEART REMOTE DEVICE CHECK
Battery Remaining Longevity: 82 mo
Battery Voltage: 2.98 V
Brady Statistic AP VP Percent: 0.01 %
Brady Statistic AP VS Percent: 0 %
Brady Statistic AS VP Percent: 99.96 %
Brady Statistic AS VS Percent: 0.03 %
Brady Statistic RA Percent Paced: 0.02 %
Brady Statistic RV Percent Paced: 99.97 %
Date Time Interrogation Session: 20221018002822
Implantable Lead Implant Date: 20181009
Implantable Lead Implant Date: 20181009
Implantable Lead Location: 753859
Implantable Lead Location: 753860
Implantable Lead Model: 5076
Implantable Lead Model: 5076
Implantable Pulse Generator Implant Date: 20181009
Lead Channel Impedance Value: 304 Ohm
Lead Channel Impedance Value: 342 Ohm
Lead Channel Impedance Value: 361 Ohm
Lead Channel Impedance Value: 456 Ohm
Lead Channel Pacing Threshold Amplitude: 0.5 V
Lead Channel Pacing Threshold Amplitude: 0.75 V
Lead Channel Pacing Threshold Pulse Width: 0.4 ms
Lead Channel Pacing Threshold Pulse Width: 0.4 ms
Lead Channel Sensing Intrinsic Amplitude: 2.375 mV
Lead Channel Sensing Intrinsic Amplitude: 2.375 mV
Lead Channel Sensing Intrinsic Amplitude: 3.625 mV
Lead Channel Setting Pacing Amplitude: 1.5 V
Lead Channel Setting Pacing Amplitude: 2.5 V
Lead Channel Setting Pacing Pulse Width: 0.4 ms
Lead Channel Setting Sensing Sensitivity: 2 mV

## 2021-09-30 NOTE — Progress Notes (Signed)
Cardiology Office Note   Date:  10/01/2021   ID:  Patricia [name], DOB 01-26-1951, MRN [id number]  PCP:  Patricia Frizzle, MD  Cardiologist:   Minus Breeding, MD   Chief Complaint  Patient presents with   MVR       History of Present Illness: Patricia [name] is a 70 y.o. 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  She has had atrial flutter ablated.   Since I last saw her she has done well.  She does her gardening.  She still cans the green beans and freezes the butter beings (which are the same as lima beans.)  The patient denies any new symptoms such as chest discomfort, neck or arm discomfort. There has been no new shortness of breath, PND or orthopnea. There have been no reported palpitations, presyncope or syncope.     Past Medical History:  Diagnosis Date   Atrial flutter ([place])    Ablated 1998 Dr. Caryl Comes   Basal cell carcinoma 06/05/2019   R low back, EDC 07/16/19   BCC (basal cell carcinoma) 07/21/2021   right mid back   Bleeding gastric ulcer    back in 2000   Breast cancer ([place])    COPD (chronic obstructive pulmonary disease) ([place])    from radiation   GERD (gastroesophageal reflux disease)    History of basal cell carcinoma (BCC) 03/22/2021   right neck and right medial ear, Moh's   Hodgkin's lymphoma ([place])    Treated with radiation and chemo   Mitral regurgitation    pvc   Osteoporosis    lumbar verterbral fracture, left ankle fracture, dexa 2015   Pancreatitis    Pneumonia    Prediabetes    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 09/13/2017   25 mm Medtronic Mosaic porcine stented bioprosthetic tissue valve   Ulcer     Past Surgical History:  Procedure Laterality Date   BREAST SURGERY     breast cancer since 2005   left mastectomy   CARDIAC ELECTROPHYSIOLOGY MAPPING AND ABLATION  1999   CARDIAC VALVE REPLACEMENT N/A    [employer]rees[employer] 03/19/2021    CATARACT EXTRACTION, BILATERAL     COLONOSCOPY     IR RADIOLOGY PERIPHERAL GUIDED IV START  08/03/2017   IR US GUIDE VASC ACCESS RIGHT  08/03/2017   LAPAROTOMY     MASTECTOMY  2005   Left-axillary   MITRAL VALVE REPAIR Right 09/13/2017   Procedure: MINIMALLY INVASIVE MITRAL VALVE REPLACEMENT (MVR) with Medtronic Mosaic Porcine Heart Valve size 74m;  Surgeon: ORexene Alberts MD;  [place] [facility]  Service: Open Heart Surgery;  Laterality: Right;   ORIF ANKLE FRACTURE Left 03/21/2014   Procedure: LEFT ANKLE FRACTURE OPEN TREATMENT BILMALLEOLAR INCLUDES INTERNAL FIXATION ;  Surgeon: TRenette Butters MD;  [place] [facility]  Service: Orthopedics;  Laterality: Left;   PACEMAKER IMPLANT N/A 09/19/2017   Medtronic Azure XT MRI conditional dual-chamber pacemaker for symptomatic complete heart block  by Dr ARayann Heman  RIGHT/LEFT HEART CATH AND CORONARY ANGIOGRAPHY N/A 06/27/2017   Procedure: Right/Left Heart Cath and Coronary Angiography;  Surgeon: MLarey [name] MD;  [facility];  Service: Cardiovascular;  Laterality: N/A;   SPLENECTOMY  1974   hodgkins disease   TEE WITHOUT CARDIOVERSION  06/12/2012   Procedure: TRANSESOPHAGEAL ECHOCARDIOGRAM (TEE);  Surgeon: DLarey [name] MD;  [place] [facility]  Service: Cardiovascular;  Laterality: N/A;   TEE WITHOUT CARDIOVERSION N/A 06/29/2017   Procedure: TRANSESOPHAGEAL ECHOCARDIOGRAM (TEE);  Surgeon: Jerline Pain, MD;  [facility]COPY;  Service: Cardiovascular;  Laterality: N/A;   TEE WITHOUT CARDIOVERSION N/A 09/13/2017   Procedure: TRANSESOPHAGEAL ECHOCARDIOGRAM (TEE);  Surgeon: Rexene Alberts, MD;  Location: [place];  Service: Open Heart Surgery;  Laterality: N/A;   TONSILLECTOMY AND ADENOIDECTOMY       Current Outpatient Medications  Medication Sig Dispense Refill   acetaminophen (TYLENOL) 325 MG tablet Take 2 tablets (650 mg total) by mouth 2 (two) times daily as needed for moderate pain or headache.      alendronate (FOSAMAX) 70 MG tablet TAKE 1 TABLET BY MOUTH EVERY 7 DAYS. TAKE WITH A FULL GLASS OF WATER ON AN EMPTY STOMACH. 12 tablet 3   atorvastatin (LIPITOR) 10 MG tablet TAKE 1 TABLET BY MOUTH ONCE A DAY 90 tablet 3   Blood Glucose Monitoring Suppl (ACCU-CHEK GUIDE) w/Device KIT 1 Units by Does not apply route daily. 1 kit 0   cetirizine (ZYRTEC) 10 MG tablet Take 10 mg by mouth daily as needed for allergies.     chlorthalidone (HYGROTON) 25 MG tablet TAKE 1 TABLET BY MOUTH ONCE A DAY 90 tablet 2   cyclobenzaprine (FLEXERIL) 10 MG tablet Take 1 tablet (10 mg total) by mouth 3 (three) times daily as needed for muscle spasms. 30 tablet 0   esomeprazole (NEXIUM) 40 MG capsule Take 40 mg by mouth daily before breakfast.   12   furosemide (LASIX) 40 MG tablet TAKE 1 TABLET BY MOUTH ONCE A DAY 90 tablet 2   glucose blood (ACCU-CHEK GUIDE) test strip USE TO TEST BLOOD SUGARS EVERY MORNING 100 each 3   Lancets (ACCU-CHEK SOFT TOUCH) lancets Check BS QAM 100 each 3   Lancets Misc. (ACCU-CHEK SOFTCLIX LANCET DEV) KIT Check BS QAM 1 kit 0   levothyroxine (SYNTHROID) 50 MCG tablet TAKE 1 TABLET BY MOUTH ONCE A DAY 90 tablet 3   lipase/protease/amylase (CREON) 12000 units CPEP capsule Take 12,000 Units by mouth 3 (three) times daily before meals.     metoprolol tartrate (LOPRESSOR) 100 MG tablet Take 1 tablet (100 mg total) by mouth 2 (two) times daily. 180 tablet 3   Multiple Vitamin (MULTIVITAMIN WITH MINERALS) TABS tablet Take 1 tablet by mouth daily. Centrum Silver.     potassium chloride SA (KLOR-CON) 20 MEQ tablet TAKE 1 TABLET BY MOUTH ONCE A DAY 90 tablet 1   triamcinolone cream (KENALOG) 0.1 % Apply topically to affected itchy rash areas twice daily for 2 weeks. Avoid applying to face, groin, and axilla. Use as directed. 80 g 1   No current facility-administered medications for this visit.    Allergies:   Azithromycin, Penicillins, Sulfa antibiotics, and Cheese    ROS:  Please see the  history of present illness.   Otherwise, review of systems are positive for none.   All other systems are reviewed and negative.    PHYSICAL EXAM: VS:  BP 122/72   Pulse 92   Ht _0  (1.651 m)   Wt 121 lb 3.2 oz (55 kg)   SpO2 97%   BMI 20.17 kg/m  , BMI Body mass index is 20.17 kg/m. GENERAL:  Well appearing NECK:  No jugular venous distention, waveform within normal limits, carotid upstroke brisk and symmetric, no bruits, no thyromegaly LUNGS:  Clear to auscultation bilaterally CHEST:  Unremarkable HEART:  PMI not displaced or sustained,S1 and  S2 within normal limits, no S3, no S4, no clicks, no rubs, she has a harsh third left intercostal space systolic murmur, no diastolic murmurs ABD:  Flat, positive bowel sounds normal in frequency in pitch, no bruits, no rebound, no guarding, no midline pulsatile mass, no hepatomegaly, no splenomegaly EXT:  2 plus pulses throughout, no edema, no cyanosis no clubbing  EKG:  EKG is not ordered today.    Recent Labs: 11/23/2020: ALT 27; BUN 26; Creat 1.24; Hemoglobin 13.8; Platelets 465; Potassium 4.7; Sodium 140    Lipid Panel    Component Value Date/Time   CHOL 136 03/25/2021 0811   TRIG 88 03/25/2021 0811   HDL 62 03/25/2021 0811   CHOLHDL 2.2 03/25/2021 0811   VLDL 31 (H) 07/07/2016 0847   LDLCALC 57 03/25/2021 0811      Wt Readings from Last 3 Encounters:  10/01/21 121 lb 3.2 oz (55 kg)  03/22/21 126 lb 2 oz (57.2 kg)  02/26/21 129 lb (58.5 kg)      Other studies Reviewed: Additional studies/ records that were reviewed today include: EKG from March, Labs. Review of the above records demonstrates:  Please see elsewhere in the note.     ASSESSMENT AND PLAN:  MVR:   She had a stable valve replacement in Nov 2019.   This was stable late last year.  I will set her up for an echo next year prior to our appointment.  She understands endocarditis prophylaxis.   AI:    This is a mild.  I will follow this up with  echo.  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  FLUTTER:     She rarely has palpitations.  We took her intentionally off DOAC.  Of note she had significant bleeding on aspirin so we are avoiding this.  HTN:   Blood pressures is at target.  No change in therapy.   CHB:  She has had normal pacemaker function in October of last year.     Current medicines are reviewed at length with the patient today.  The patient does not have concerns regarding medicines.  The following changes have been made: None  Labs/ tests ordered today include:   Orders Placed This Encounter  Procedures   ECHOCARDIOGRAM COMPLETE      Disposition:   FU with me in 1 year     Signed, Minus Breeding, MD  10/01/2021 9:02 AM    [place]

## 2021-10-01 ENCOUNTER — Ambulatory Visit: Payer: Medicare PPO | Admitting: Cardiology

## 2021-10-01 ENCOUNTER — Encounter: Payer: Self-pay | Admitting: Cardiology

## 2021-10-01 ENCOUNTER — Other Ambulatory Visit: Payer: Self-pay

## 2021-10-01 VITALS — BP 122/72 | HR 92 | Ht 65.0 in | Wt 121.2 lb

## 2021-10-01 DIAGNOSIS — I483 Typical atrial flutter: Secondary | ICD-10-CM

## 2021-10-01 DIAGNOSIS — I340 Nonrheumatic mitral (valve) insufficiency: Secondary | ICD-10-CM

## 2021-10-01 DIAGNOSIS — I272 Pulmonary hypertension, unspecified: Secondary | ICD-10-CM

## 2021-10-01 DIAGNOSIS — I10 Essential (primary) hypertension: Secondary | ICD-10-CM | POA: Diagnosis not present

## 2021-10-01 NOTE — Patient Instructions (Signed)
Medication Instructions:  Your physician recommends that you continue on your current medications as directed. Please refer to the Current Medication list given to you today.   *If you need a refill on your cardiac medications before your next appointment, please call your pharmacy*  Lab Work: NONE  Testing/Procedures: Your physician has requested that you have an echocardiogram. Echocardiography is a painless test that uses sound waves to create images of your heart. It provides your doctor with information about the size and shape of your heart and how well your heart's chambers and valves are working. This procedure takes approximately one hour. There are no restrictions for this procedure.  [place] [address]  TO BE DONE September 2023   Follow-Up: At [facility], you and your health needs are our priority.  As part of our continuing mission to provide you with exceptional heart care, we have created designated Provider Care Teams.  These Care Teams include your primary Cardiologist (physician) and Advanced Practice Providers (APPs -  Physician Assistants and Nurse Practitioners) who all work together to provide you with the care you need, when you need it.  We recommend signing up for the patient portal called "MyChart".  Sign up information is provided on this After Visit Summary.  MyChart is used to connect with patients for Virtual Visits (Telemedicine).  Patients are able to view lab/test results, encounter notes, upcoming appointments, etc.  Non-urgent messages can be sent to your provider as well.   To learn more about what you can do with MyChart, go to [email or web address].    Your next appointment:   12 month(s) AFTER ECHO   The format for your next appointment:   In Person  Provider:   You may see Minus Breeding, MD or one of the following Advanced Practice Providers on your designated Care Team:   Rosaria Ferries, PA-C Caron Presume,  PA-C Jory Sims, DNP, ANP

## 2021-10-04 DIAGNOSIS — S46012D Strain of muscle(s) and tendon(s) of the rotator cuff of left shoulder, subsequent encounter: Secondary | ICD-10-CM | POA: Diagnosis not present

## 2021-10-04 DIAGNOSIS — M25611 Stiffness of right shoulder, not elsewhere classified: Secondary | ICD-10-CM | POA: Diagnosis not present

## 2021-10-04 DIAGNOSIS — M6281 Muscle weakness (generalized): Secondary | ICD-10-CM | POA: Diagnosis not present

## 2021-10-04 DIAGNOSIS — M25612 Stiffness of left shoulder, not elsewhere classified: Secondary | ICD-10-CM | POA: Diagnosis not present

## 2021-10-04 DIAGNOSIS — M25512 Pain in left shoulder: Secondary | ICD-10-CM | POA: Diagnosis not present

## 2021-10-04 DIAGNOSIS — M25511 Pain in right shoulder: Secondary | ICD-10-CM | POA: Diagnosis not present

## 2021-10-06 DIAGNOSIS — S46012D Strain of muscle(s) and tendon(s) of the rotator cuff of left shoulder, subsequent encounter: Secondary | ICD-10-CM | POA: Diagnosis not present

## 2021-10-06 DIAGNOSIS — M25612 Stiffness of left shoulder, not elsewhere classified: Secondary | ICD-10-CM | POA: Diagnosis not present

## 2021-10-06 DIAGNOSIS — M6281 Muscle weakness (generalized): Secondary | ICD-10-CM | POA: Diagnosis not present

## 2021-10-06 DIAGNOSIS — M25611 Stiffness of right shoulder, not elsewhere classified: Secondary | ICD-10-CM | POA: Diagnosis not present

## 2021-10-07 NOTE — Progress Notes (Signed)
Remote pacemaker transmission.   

## 2021-10-11 DIAGNOSIS — M6281 Muscle weakness (generalized): Secondary | ICD-10-CM | POA: Diagnosis not present

## 2021-10-11 DIAGNOSIS — S46011D Strain of muscle(s) and tendon(s) of the rotator cuff of right shoulder, subsequent encounter: Secondary | ICD-10-CM | POA: Diagnosis not present

## 2021-10-11 DIAGNOSIS — M25611 Stiffness of right shoulder, not elsewhere classified: Secondary | ICD-10-CM | POA: Diagnosis not present

## 2021-10-11 DIAGNOSIS — S46012D Strain of muscle(s) and tendon(s) of the rotator cuff of left shoulder, subsequent encounter: Secondary | ICD-10-CM | POA: Diagnosis not present

## 2021-10-11 DIAGNOSIS — M25612 Stiffness of left shoulder, not elsewhere classified: Secondary | ICD-10-CM | POA: Diagnosis not present

## 2021-10-14 ENCOUNTER — Encounter: Payer: Self-pay | Admitting: Family Medicine

## 2021-10-14 ENCOUNTER — Other Ambulatory Visit: Payer: Self-pay

## 2021-10-14 ENCOUNTER — Ambulatory Visit: Payer: Medicare PPO | Admitting: Family Medicine

## 2021-10-14 VITALS — BP 118/60 | HR 94 | Temp 98.1°F | Resp 16 | Ht 65.0 in | Wt 117.0 lb

## 2021-10-14 DIAGNOSIS — R052 Subacute cough: Secondary | ICD-10-CM

## 2021-10-14 DIAGNOSIS — J019 Acute sinusitis, unspecified: Secondary | ICD-10-CM | POA: Diagnosis not present

## 2021-10-14 DIAGNOSIS — B9689 Other specified bacterial agents as the cause of diseases classified elsewhere: Secondary | ICD-10-CM | POA: Diagnosis not present

## 2021-10-14 MED ORDER — LEVOFLOXACIN 500 MG PO TABS: 500.0000 mg | ORAL_TABLET | Freq: Every day | ORAL | 0 refills | Status: AC

## 2021-10-14 MED ORDER — FLUTICASONE PROPIONATE 50 MCG/ACT NA SUSP: 2.0000 | Freq: Every day | NASAL | 6 refills | Status: DC

## 2021-10-14 MED ORDER — HYDROCODONE BIT-HOMATROP MBR 5-1.5 MG/5ML PO SOLN: 5.0000 mL | Freq: Three times a day (TID) | ORAL | 0 refills | Status: DC | PRN

## 2021-10-14 NOTE — Progress Notes (Signed)
Subjective:    Patient ID: Patricia [name], female    DOB: 01-Jun-1951, 70 y.o.   MRN: [id number]  Patient is a very pleasant 70 year old Caucasian female with a past medical history of lymphoma as well as mitral valve placement with a bioprosthetic valve.  She states that for the last 6 weeks she has had a cough.  It waxes and wanes.  It will be better for a few days and then I will come back.  He recently she started developing pain in her right maxillary sinus, postnasal drip, and a dull headache.  She also reports some pain in her teeth.  She has been on allergy medication without relief.  She is been taking Mucinex without relief.  She denies any chest pain or pleurisy or hemoptysis.  She denies any wheezing.  She denies any leg swelling.  She does state that it is harder to breathe at night when she lies down.  Past Medical History:  Diagnosis Date   Atrial flutter ([place])    Ablated 1998 Dr. Caryl Comes   Basal cell carcinoma 06/05/2019   R low back, EDC 07/16/19   BCC (basal cell carcinoma) 07/21/2021   right mid back   Bleeding gastric ulcer    back in 2000   Breast cancer ([place])    COPD (chronic obstructive pulmonary disease) ([place])    from radiation   GERD (gastroesophageal reflux disease)    History of basal cell carcinoma (BCC) 03/22/2021   right neck and right medial ear, Moh's   Hodgkin's lymphoma ([place])    Treated with radiation and chemo   Mitral regurgitation    pvc   Osteoporosis    lumbar verterbral fracture, left ankle fracture, dexa 2015   Pancreatitis    Pneumonia    Prediabetes    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 09/13/2017   25 mm Medtronic Mosaic porcine stented bioprosthetic tissue valve   Ulcer    Past Surgical History:  Procedure Laterality Date   BREAST SURGERY     breast cancer since 2005   left mastectomy   CARDIAC ELECTROPHYSIOLOGY MAPPING AND ABLATION  1999   CARDIAC VALVE REPLACEMENT N/A    Phreesia 03/19/2021    CATARACT EXTRACTION, BILATERAL     COLONOSCOPY     IR RADIOLOGY PERIPHERAL GUIDED IV START  08/03/2017   IR US GUIDE VASC ACCESS RIGHT  08/03/2017   LAPAROTOMY     MASTECTOMY  2005   Left-axillary   MITRAL VALVE REPAIR Right 09/13/2017   Procedure: MINIMALLY INVASIVE MITRAL VALVE REPLACEMENT (MVR) with Medtronic Mosaic Porcine Heart Valve size 55m;  Surgeon: ORexene Alberts MD;  [facility]  Service: Open Heart Surgery;  Laterality: Right;   ORIF ANKLE FRACTURE Left 03/21/2014   Procedure: LEFT ANKLE FRACTURE OPEN TREATMENT BILMALLEOLAR INCLUDES INTERNAL FIXATION ;  Surgeon: TRenette Butters MD;  [facility] [facility]  Service: Orthopedics;  Laterality: Left;   PACEMAKER IMPLANT N/A 09/19/2017   Medtronic Azure XT MRI conditional dual-chamber pacemaker for symptomatic complete heart block  by Dr ARayann Heman  RIGHT/LEFT HEART CATH AND CORONARY ANGIOGRAPHY N/A 06/27/2017   Procedure: Right/Left Heart Cath and Coronary Angiography;  Surgeon: MLarey [name] MD;  [facility];  Service: Cardiovascular;  Laterality: N/A;   SPLENECTOMY  1974   hodgkins disease   TEE WITHOUT CARDIOVERSION  06/12/2012   Procedure: TRANSESOPHAGEAL ECHOCARDIOGRAM (TEE);  Surgeon: DLarey [name] MD;  [facility]  Service: Cardiovascular;  Laterality: N/A;   TEE WITHOUT CARDIOVERSION N/A 06/29/2017   Procedure: TRANSESOPHAGEAL ECHOCARDIOGRAM (TEE);  Surgeon: Jerline Pain, MD;  Location[facility];  Service: Cardiovascular;  Laterality: N/A;   TEE WITHOUT CARDIOVERSION N/A 09/13/2017   Procedure: TRANSESOPHAGEAL ECHOCARDIOGRAM (TEE);  Surgeon: Rexene Alberts, MD;  Location: [place];  Service: Open Heart Surgery;  Laterality: N/A;   TONSILLECTOMY AND ADENOIDECTOMY     Current Outpatient Medications on File Prior to Visit  Medication Sig Dispense Refill   acetaminophen (TYLENOL) 325 MG tablet Take 2 tablets (650 mg total) by mouth 2 (two) times daily as needed for  moderate pain or headache.     alendronate (FOSAMAX) 70 MG tablet TAKE 1 TABLET BY MOUTH EVERY 7 DAYS. TAKE WITH A FULL GLASS OF WATER ON AN EMPTY STOMACH. 12 tablet 3   atorvastatin (LIPITOR) 10 MG tablet TAKE 1 TABLET BY MOUTH ONCE A DAY 90 tablet 3   Blood Glucose Monitoring Suppl (ACCU-CHEK GUIDE) w/Device KIT 1 Units by Does not apply route daily. 1 kit 0   cetirizine (ZYRTEC) 10 MG tablet Take 10 mg by mouth daily as needed for allergies.     chlorthalidone (HYGROTON) 25 MG tablet TAKE 1 TABLET BY MOUTH ONCE A DAY 90 tablet 2   cyclobenzaprine (FLEXERIL) 10 MG tablet Take 1 tablet (10 mg total) by mouth 3 (three) times daily as needed for muscle spasms. 30 tablet 0   esomeprazole (NEXIUM) 40 MG capsule Take 40 mg by mouth daily before breakfast.   12   furosemide (LASIX) 40 MG tablet TAKE 1 TABLET BY MOUTH ONCE A DAY 90 tablet 2   glucose blood (ACCU-CHEK GUIDE) test strip USE TO TEST BLOOD SUGARS EVERY MORNING 100 each 3   Lancets (ACCU-CHEK SOFT TOUCH) lancets Check BS QAM 100 each 3   Lancets Misc. (ACCU-CHEK SOFTCLIX LANCET DEV) KIT Check BS QAM 1 kit 0   levothyroxine (SYNTHROID) 50 MCG tablet TAKE 1 TABLET BY MOUTH ONCE A DAY 90 tablet 3   lipase/protease/amylase (CREON) 12000 units CPEP capsule Take 12,000 Units by mouth 3 (three) times daily before meals.     metoprolol tartrate (LOPRESSOR) 100 MG tablet Take 1 tablet (100 mg total) by mouth 2 (two) times daily. 180 tablet 3   Multiple Vitamin (MULTIVITAMIN WITH MINERALS) TABS tablet Take 1 tablet by mouth daily. Centrum Silver.     potassium chloride SA (KLOR-CON) 20 MEQ tablet TAKE 1 TABLET BY MOUTH ONCE A DAY 90 tablet 1   triamcinolone cream (KENALOG) 0.1 % Apply topically to affected itchy rash areas twice daily for 2 weeks. Avoid applying to face, groin, and axilla. Use as directed. 80 g 1   No current facility-administered medications on file prior to visit.   Allergies  Allergen Reactions   Azithromycin Other (See  Comments)    pancreatitis   Penicillins Anaphylaxis, Hives, Swelling and Other (See Comments)    PATIENT HAS HAD A PCN REACTION WITH IMMEDIATE RASH, FACIAL/TONGUE/THROAT SWELLING, SOB, OR LIGHTHEADEDNESS WITH HYPOTENSION:  #  #  #  YES  #  #  #   HAS PT DEVELOPED SEVERE RASH INVOLVING MUCUS MEMBRANES or SKIN NECROSIS: #  #  #  YES  #  #  #  Has patient had a PCN reaction that required hospitalization: No Has patient had a PCN reaction occurring within the last 10 years: No    Sulfa Antibiotics Hives and Rash   Cheese Nausea And Vomiting  Social History   Socioeconomic History   Marital status: Widowed    Spouse name: Not on file   Number of children: Not on file   Years of education: Not on file   Highest education level: Master's degree (e.g., MA, MS, MEng, MEd, MSW, MBA)  Occupational History   Occupation: Pharmacist, hospital (retired)  Tobacco Use   Smoking status: Never   Smokeless tobacco: Never  Vaping Use   Vaping Use: Never used  Substance and Sexual Activity   Alcohol use: No   Drug use: No   Sexual activity: Yes  Other Topics Concern   Not on file  Social History Narrative   Husband passed away from [place] in 08/31/2017.   Social Determinants of Health   Financial Resource Strain: Low Risk    Difficulty of Paying Living Expenses: Not hard at all  Food Insecurity: No Food Insecurity   Worried About Charity fundraiser in the Last Year: Never true   [place] in the Last Year: Never true  Transportation Needs: No Transportation Needs   Lack of Transportation (Medical): No   Lack of Transportation (Non-Medical): No  Physical Activity: Sufficiently Active   Days of Exercise per Week: 7 days   Minutes of Exercise per Session: 30 min  Stress: No Stress Concern Present   Feeling of Stress : Not at all  Social Connections: Moderately Integrated   Frequency of Communication with Friends and Family: More than three times a week   Frequency of Social Gatherings with Friends  and Family: Three times a week   Attends Religious Services: More than 4 times per year   Active Member of Clubs or Organizations: Yes   Attends Archivist Meetings: More than 4 times per year   Marital Status: Widowed  Human resources officer Violence: Not At Risk   Fear of Current or Ex-Partner: No   Emotionally Abused: No   Physically Abused: No   Sexually Abused: No   Family History  Problem Relation Age of Onset   Rheumatic fever Father        Died suddenly age 37   Diabetes Father    Cancer Father        colon   Heart disease Father        rheumatic fever x 3   Arthritis Maternal Grandmother    Hypertension Maternal Grandmother    Diabetes Paternal Grandmother    Vision loss Paternal Grandmother        glaucoma      Review of Systems     Objective:   Physical Exam Vitals reviewed.  Constitutional:      General: She is not in acute distress.    Appearance: She is well-developed. She is not diaphoretic.  HENT:     Head: Normocephalic and atraumatic.     Right Ear: Tympanic membrane, ear canal and external ear normal.     Left Ear: Tympanic membrane, ear canal and external ear normal.     Nose:     Right Turbinates: Enlarged and swollen.     Left Turbinates: Enlarged and swollen.     Right Sinus: Maxillary sinus tenderness and frontal sinus tenderness present.     Mouth/Throat:     Pharynx: No oropharyngeal exudate.  Eyes:     General:        Right eye: No discharge.     Conjunctiva/sclera: Conjunctivae normal.     Pupils: Pupils are equal, round, and reactive to light.  Neck:     Thyroid: No thyromegaly.     Vascular: No JVD.  Cardiovascular:     Rate and Rhythm: Normal rate and regular rhythm.     Heart sounds: Normal heart sounds. No murmur heard.   No friction rub. No gallop.  Pulmonary:     Effort: Pulmonary effort is normal. No respiratory distress.     Breath sounds: Normal breath sounds. No stridor. No wheezing or rales.  Chest:     Chest  wall: No tenderness.  Abdominal:     General: Bowel sounds are normal. There is no distension.     Palpations: Abdomen is soft. There is no mass.     Tenderness: There is no abdominal tenderness. There is no guarding or rebound.  Musculoskeletal:        General: No tenderness. Normal range of motion.     Cervical back: Neck supple.  Lymphadenopathy:     Cervical: No cervical adenopathy.  Skin:    General: Skin is warm.     Coloration: Skin is not pale.     Findings: No erythema or rash.  Neurological:     Mental Status: She is alert and oriented to person, place, and time.     Cranial Nerves: No cranial nerve deficit.     Motor: No abnormal muscle tone.     Coordination: Coordination normal.     Deep Tendon Reflexes: Reflexes normal.  Psychiatric:        Behavior: Behavior normal.        Thought Content: Thought content normal.        Judgment: Judgment normal.          Assessment & Plan:   Subacute cough - Plan: DG Chest 2 View  Acute bacterial rhinosinusitis I believe the patient has a cough due to postnasal drip related to sinusitis.  Begin Levaquin 500 mg daily for 7 days and Flonase 2 sprays each nostril daily.  If cough is not improving next week, I would like her to go get a chest x-ray

## 2021-10-18 DIAGNOSIS — S46012D Strain of muscle(s) and tendon(s) of the rotator cuff of left shoulder, subsequent encounter: Secondary | ICD-10-CM | POA: Diagnosis not present

## 2021-10-18 DIAGNOSIS — M6281 Muscle weakness (generalized): Secondary | ICD-10-CM | POA: Diagnosis not present

## 2021-10-18 DIAGNOSIS — M25611 Stiffness of right shoulder, not elsewhere classified: Secondary | ICD-10-CM | POA: Diagnosis not present

## 2021-10-18 DIAGNOSIS — S46011D Strain of muscle(s) and tendon(s) of the rotator cuff of right shoulder, subsequent encounter: Secondary | ICD-10-CM | POA: Diagnosis not present

## 2021-10-18 DIAGNOSIS — M25612 Stiffness of left shoulder, not elsewhere classified: Secondary | ICD-10-CM | POA: Diagnosis not present

## 2021-10-20 DIAGNOSIS — M25611 Stiffness of right shoulder, not elsewhere classified: Secondary | ICD-10-CM | POA: Diagnosis not present

## 2021-10-20 DIAGNOSIS — M25612 Stiffness of left shoulder, not elsewhere classified: Secondary | ICD-10-CM | POA: Diagnosis not present

## 2021-10-20 DIAGNOSIS — S46011D Strain of muscle(s) and tendon(s) of the rotator cuff of right shoulder, subsequent encounter: Secondary | ICD-10-CM | POA: Diagnosis not present

## 2021-10-20 DIAGNOSIS — S46012D Strain of muscle(s) and tendon(s) of the rotator cuff of left shoulder, subsequent encounter: Secondary | ICD-10-CM | POA: Diagnosis not present

## 2021-10-20 DIAGNOSIS — M6281 Muscle weakness (generalized): Secondary | ICD-10-CM | POA: Diagnosis not present

## 2021-11-09 ENCOUNTER — Telehealth: Payer: Self-pay

## 2021-11-09 ENCOUNTER — Other Ambulatory Visit: Payer: Self-pay | Admitting: Family Medicine

## 2021-11-09 ENCOUNTER — Encounter: Payer: Self-pay | Admitting: Family Medicine

## 2021-11-09 MED ORDER — PREDNISONE 20 MG PO TABS: ORAL_TABLET | ORAL | 0 refills | Status: DC

## 2021-11-09 NOTE — Telephone Encounter (Signed)
Pt called to report continued dry cough (x3w). Pt denies any fever/shob, does report some possible wheezing when she has been coughing non-stop. Pt has not taken any OTC medications for her symptoms. Pt would like to know what you recommend.   Please advise, thanks!

## 2021-11-09 NOTE — Telephone Encounter (Signed)
Spoke with pt and advised. Also recommended she use Delsym and Flonase to help with her symptoms. Pt voiced understanding. Nothing further needed.

## 2021-11-18 DIAGNOSIS — K219 Gastro-esophageal reflux disease without esophagitis: Secondary | ICD-10-CM | POA: Diagnosis not present

## 2021-11-18 DIAGNOSIS — Z800 Family history of malignant neoplasm of digestive organs: Secondary | ICD-10-CM | POA: Diagnosis not present

## 2021-11-18 DIAGNOSIS — K8681 Exocrine pancreatic insufficiency: Secondary | ICD-10-CM | POA: Diagnosis not present

## 2021-11-18 DIAGNOSIS — R634 Abnormal weight loss: Secondary | ICD-10-CM | POA: Diagnosis not present

## 2021-11-18 DIAGNOSIS — Z86010 Personal history of colonic polyps: Secondary | ICD-10-CM | POA: Diagnosis not present

## 2021-11-18 DIAGNOSIS — R748 Abnormal levels of other serum enzymes: Secondary | ICD-10-CM | POA: Diagnosis not present

## 2021-11-18 DIAGNOSIS — Z1211 Encounter for screening for malignant neoplasm of colon: Secondary | ICD-10-CM | POA: Diagnosis not present

## 2021-11-26 ENCOUNTER — Other Ambulatory Visit: Payer: Self-pay | Admitting: Cardiology

## 2021-11-26 ENCOUNTER — Other Ambulatory Visit: Payer: Self-pay | Admitting: Family Medicine

## 2021-12-10 ENCOUNTER — Telehealth: Payer: Self-pay

## 2021-12-10 NOTE — Telephone Encounter (Signed)
Pt called in wanting to know if she needed to be scheduled to come in for a Tetanus shot. Please advise.  Cb#: [phone]

## 2021-12-10 NOTE — Telephone Encounter (Signed)
Advised patient to go to a pharmacy for injection due to insurance requirements and cost.

## 2021-12-15 ENCOUNTER — Encounter: Payer: Self-pay | Admitting: Nurse Practitioner

## 2021-12-15 ENCOUNTER — Other Ambulatory Visit: Payer: Self-pay

## 2021-12-15 ENCOUNTER — Ambulatory Visit: Payer: Medicare PPO | Admitting: Nurse Practitioner

## 2021-12-15 VITALS — BP 138/76 | HR 76 | Ht 65.0 in | Wt 121.0 lb

## 2021-12-15 DIAGNOSIS — R600 Localized edema: Secondary | ICD-10-CM

## 2021-12-15 DIAGNOSIS — M7989 Other specified soft tissue disorders: Secondary | ICD-10-CM | POA: Diagnosis not present

## 2021-12-15 LAB — COMPLETE METABOLIC PANEL WITH GFR
AG Ratio: 1.5 (calc) (ref 1.0–2.5)
ALT: 17 U/L (ref 6–29)
AST: 18 U/L (ref 10–35)
Albumin: 4.1 g/dL (ref 3.6–5.1)
Alkaline phosphatase (APISO): 103 U/L (ref 37–153)
BUN/Creatinine Ratio: 21 (calc) (ref 6–22)
BUN: 24 mg/dL (ref 7–25)
CO2: 32 mmol/L (ref 20–32)
Calcium: 10 mg/dL (ref 8.6–10.4)
Chloride: 87 mmol/L — ABNORMAL LOW (ref 98–110)
Creat: 1.13 mg/dL — ABNORMAL HIGH (ref 0.60–1.00)
Globulin: 2.7 g/dL (calc) (ref 1.9–3.7)
Glucose, Bld: 134 mg/dL — ABNORMAL HIGH (ref 65–99)
Potassium: 4.1 mmol/L (ref 3.5–5.3)
Sodium: 131 mmol/L — ABNORMAL LOW (ref 135–146)
Total Bilirubin: 0.5 mg/dL (ref 0.2–1.2)
Total Protein: 6.8 g/dL (ref 6.1–8.1)
eGFR: 52 mL/min/{1.73_m2} — ABNORMAL LOW (ref >=60)

## 2021-12-15 NOTE — Progress Notes (Signed)
Subjective:    Patient ID: Patricia [name], female    DOB: 09-13-1951, 71 y.o.   MRN: [id number]  HPI: Patricia [name] is a 71 y.o. female presenting for bilateral lower extremity swelling and shortness of breath.  Chief Complaint  Patient presents with   Leg Swelling    Bilateral, worse at night   LOWER EXTREMITY SWELLING Duration: 5 days  Last EF:  B blocker: metoprolol tartrate 100 mg twice daily Diuretic: Lasix 40 mg daily Weight: dry weight typically 115 lbs Chest pain: no Shortness of breath: yes at night time and with activity  Edema: yes, lower extremities Orthopnea: yes Paroxysmal nocturnal dyspnea: yes Dyspnea on exertion: yes Pneumovax:  Up to Date Treatments attempted: elevation Aggravating factors: worse at end of day, worse when standing  Patient reports she had been recently losing weight because of her appetite but has gained weight back recently with addition of Creon. Reports GI provider was recently worried about her kidney and liver function.  She stays very active physically.  She also strictly watches her sodium intake.   Allergies  Allergen Reactions   Azithromycin Other (See Comments)    pancreatitis   Penicillins Anaphylaxis, Hives, Swelling and Other (See Comments)    PATIENT HAS HAD A PCN REACTION WITH IMMEDIATE RASH, FACIAL/TONGUE/THROAT SWELLING, SOB, OR LIGHTHEADEDNESS WITH HYPOTENSION:  #  #  #  YES  #  #  #   HAS PT DEVELOPED SEVERE RASH INVOLVING MUCUS MEMBRANES or SKIN NECROSIS: #  #  #  YES  #  #  #  Has patient had a PCN reaction that required hospitalization: No Has patient had a PCN reaction occurring within the last 10 years: No    Sulfa Antibiotics Hives and Rash   Cheese Nausea And Vomiting    Outpatient Encounter Medications as of 12/15/2021  Medication Sig   acetaminophen (TYLENOL) 325 MG tablet Take 2 tablets (650 mg total) by mouth 2 (two) times daily as needed for moderate pain or headache.   alendronate (FOSAMAX) 70  MG tablet TAKE 1 TABLET BY MOUTH EVERY 7 DAYS. TAKE WITH A FULL GLASS OF WATER ON AN EMPTY STOMACH.   atorvastatin (LIPITOR) 10 MG tablet TAKE 1 TABLET BY MOUTH ONCE A DAY   Blood Glucose Monitoring Suppl (ACCU-CHEK GUIDE) w/Device KIT 1 Units by Does not apply route daily.   cetirizine (ZYRTEC) 10 MG tablet Take 10 mg by mouth daily as needed for allergies.   chlorthalidone (HYGROTON) 25 MG tablet TAKE 1 TABLET BY MOUTH ONCE A DAY   cyclobenzaprine (FLEXERIL) 10 MG tablet Take 1 tablet (10 mg total) by mouth 3 (three) times daily as needed for muscle spasms.   esomeprazole (NEXIUM) 40 MG capsule Take 40 mg by mouth daily before breakfast.    fluticasone (FLONASE) 50 MCG/ACT nasal spray Place 2 sprays into both nostrils daily.   furosemide (LASIX) 40 MG tablet TAKE 1 TABLET BY MOUTH ONCE A DAY   glucose blood (ACCU-CHEK GUIDE) test strip USE TO TEST BLOOD SUGARS EVERY MORNING   HYDROcodone bit-homatropine (HYCODAN) 5-1.5 MG/5ML syrup Take 5 mLs by mouth every 8 (eight) hours as needed for cough.   Lancets (ACCU-CHEK SOFT TOUCH) lancets Check BS QAM   Lancets Misc. (ACCU-CHEK SOFTCLIX LANCET DEV) KIT Check BS QAM   levothyroxine (SYNTHROID) 50 MCG tablet TAKE 1 TABLET BY MOUTH ONCE A DAY   lipase/protease/amylase (CREON) 12000 units CPEP capsule Take 12,000 Units by mouth 3 (three) times daily  before meals.   metoprolol tartrate (LOPRESSOR) 100 MG tablet Take 1 tablet (100 mg total) by mouth 2 (two) times daily.   Multiple Vitamin (MULTIVITAMIN WITH MINERALS) TABS tablet Take 1 tablet by mouth daily. Centrum Silver.   potassium chloride SA (KLOR-CON M) 20 MEQ tablet TAKE 1 TABLET BY MOUTH ONCE A DAY   triamcinolone cream (KENALOG) 0.1 % Apply topically to affected itchy rash areas twice daily for 2 weeks. Avoid applying to face, groin, and axilla. Use as directed.   [DISCONTINUED] predniSONE (DELTASONE) 20 MG tablet 3 tabs poqday 1-2, 2 tabs poqday 3-4, 1 tab poqday 5-6   No facility-administered  encounter medications on file as of 12/15/2021.    Patient Active Problem List   Diagnosis Date Noted   S/P MVR (mitral valve repair) 10/02/2019   Pneumonia    Hodgkin's lymphoma (HCC)    GERD (gastroesophageal reflux disease)    COPD (chronic obstructive pulmonary disease) ([facility])    Atrial flutter ([facility])    Breast cancer ([facility]CC)    Bleeding gastric ulcer    Long term (current) use of anticoagulants [Z79.01] 09/25/2017   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 09/13/2017   Pulmonary HTN ([place]) 06/25/2017   Mitral valve stenosis 04/25/2017   Aortic valve regurgitation 04/25/2017   Osteoporosis    Pancreatitis    Prediabetes    Acute respiratory failure with hypoxia ([place]) 01/07/2015   Hodgkin lymphoma ([place]) 01/07/2015   Abdominal pain, epigastric    Diarrhea    AKI (acute kidney injury) ([place])    Hyponatremia    Acute pancreatitis 12/25/2014   Leukocytosis    Mitral regurgitation 06/07/2012   Syncope 04/26/2012   HTN (hypertension) 04/26/2012   Abnormal stress test 04/26/2012   Hyperkalemia 04/26/2012   Tachycardia 04/26/2012    Past Medical History:  Diagnosis Date   Atrial flutter ([facility])    Ablated 1998 Dr. Caryl Comes   Basal cell carcinoma 06/05/2019   R low back, EDC 07/16/19   BCC (basal cell carcinoma) 07/21/2021   right mid back   Bleeding gastric ulcer    back in 2000   Breast cancer ([place])    COPD (chronic obstructive pulmonary disease) ([place])    from radiation   GERD (gastroesophageal reflux disease)    History of basal cell carcinoma (BCC) 03/22/2021   right neck and right medial ear, Moh's   Hodgkin's lymphoma ([place])    Treated with radiation and chemo   Mitral regurgitation    pvc   Osteoporosis    lumbar verterbral fracture, left ankle fracture, dexa 2015   Pancreatitis    Pneumonia    Prediabetes    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 09/13/2017   25 mm Medtronic Mosaic porcine stented  bioprosthetic tissue valve   Ulcer     Relevant past medical, surgical, family and social history reviewed and updated as indicated. Interim medical history since our last visit reviewed.  Review of Systems Per HPI unless specifically indicated above     Objective:    BP 138/76    Pulse 76    Ht _0  (1.651 m)    Wt 121 lb (54.9 kg)    SpO2 100%    BMI 20.14 kg/m   Wt Readings from Last 3 Encounters:  12/15/21 121 lb (54.9 kg)  10/14/21 117 lb (53.1 kg)  10/01/21 121 lb 3.2 oz (55 kg)    Physical Exam Vitals and nursing note reviewed.  Constitutional:  General: She is not in acute distress.    Appearance: Normal appearance. She is not toxic-appearing.  Cardiovascular:     Rate and Rhythm: Normal rate and regular rhythm.  Pulmonary:     Effort: Pulmonary effort is normal. No respiratory distress.     Breath sounds: Normal breath sounds. No wheezing, rhonchi or rales.  Musculoskeletal:     Right lower leg: 1+ Pitting Edema present.     Left lower leg: 1+ Pitting Edema present.  Skin:    General: Skin is warm and dry.     Coloration: Skin is pale.  Neurological:     Mental Status: She is alert and oriented to person, place, and time.  Psychiatric:        Mood and Affect: Mood normal.        Behavior: Behavior normal.        Thought Content: Thought content normal.        Judgment: Judgment normal.      Assessment & Plan:  1. Bilateral lower extremity edema Acute.  Patient with significant cardiac history including mitral valve repair, complete bundle branch block and pacemaker.  She follows with Cardiology regularly.  Her weight is up from her baseline, suspect possible fluid overload.  Her vital signs are stable today in office.  Will check kidney function with electrolytes and plan to recheck swelling and discuss labs with PCP in 48 hours.  If symptoms worsen in the meantime, go to ER.  - COMPLETE METABOLIC PANEL WITH GFR    Follow up plan: Return in about 2  days (around 12/17/2021) for edema follow up with PCP.

## 2021-12-17 ENCOUNTER — Ambulatory Visit: Payer: Medicare PPO | Admitting: Family Medicine

## 2021-12-17 ENCOUNTER — Encounter: Payer: Self-pay | Admitting: Family Medicine

## 2021-12-17 ENCOUNTER — Other Ambulatory Visit: Payer: Self-pay

## 2021-12-17 VITALS — BP 120/62 | HR 86 | Ht 66.0 in | Wt 119.0 lb

## 2021-12-17 DIAGNOSIS — R0600 Dyspnea, unspecified: Secondary | ICD-10-CM | POA: Diagnosis not present

## 2021-12-17 MED ORDER — FUROSEMIDE 40 MG PO TABS: 80.0000 mg | ORAL_TABLET | Freq: Every day | ORAL | 2 refills | Status: DC

## 2021-12-17 NOTE — Progress Notes (Signed)
Subjective:    Patient ID: Patricia [name], female    DOB: 09/21/1951, 71 y.o.   MRN: [id number]  Patient is a very pleasant 71 year old Caucasian female with a past medical history of lymphoma as well as mitral valve placement with a bioprosthetic valve.  Last ECHO (10/21): IMPRESSIONS  1. Normal LV function; mild AI; s/p MVR with normal mean gradient (5  mmHg) and no MR; mild to moderate TR.   2. Left ventricular ejection fraction, by estimation, is 60 to 65%. The  left ventricle has normal function. The left ventricle has no regional  wall motion abnormalities. Left ventricular diastolic parameters are  indeterminate. Elevated left atrial  pressure.   3. Right ventricular systolic function is normal. The right ventricular  size is normal. There is mildly elevated pulmonary artery systolic  pressure.   4. The mitral valve has been repaired/replaced. No evidence of mitral  valve regurgitation. No evidence of mitral stenosis. There is a  bioprosthetic valve present in the mitral position.   5. Tricuspid valve regurgitation is mild to moderate.   6. The aortic valve is normal in structure. Aortic valve regurgitation is  mild. No aortic stenosis is present.   7. The inferior vena cava is normal in size with greater than 50%  respiratory variability, suggesting right atrial pressure of 3 mmHg.   Had Cath in 2018 with NO CAD, but mild pulmonary htn due to long standing mitral valve disease.   Wt Readings from Last 3 Encounters:  12/17/21 119 lb (54 kg)  12/15/21 121 lb (54.9 kg)  10/14/21 117 lb (53.1 kg)   Patient states that she has been swelling more recently.  She has noticed +1 pitting edema in both legs.  She also has noticed some weight gain.  She also has had some orthopnea and dyspnea on exertion.  Recently she saw my partner who increased her Lasix to 40 mg twice daily and she is lost 2 pounds since that appointment.  The swelling in her legs is improved.  She denies any  palpitations or syncope or chest pain Past Medical History:  Diagnosis Date   Atrial flutter ([place])    Ablated 1998 Dr. Caryl Comes   Basal cell carcinoma 06/05/2019   R low back, [facility] 07/16/19   BCC (basal cell carcinoma) 07/21/2021   right mid back, [facility] 09/15/2021   Bleeding gastric ulcer    back in 2000   Breast cancer (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 09/13/2017   25 mm Medtronic Mosaic porcine stented bioprosthetic tissue valve   Ulcer    Past Surgical History:  Procedure Laterality Date   BREAST SURGERY     breast cancer since 2005   left mastectomy   CARDIAC ELECTROPHYSIOLOGY MAPPING AND ABLATION  1999   CARDIAC VALVE REPLACEMENT N/A    Phreesia 03/19/2021   CATARACT EXTRACTION, BILATERAL     COLONOSCOPY     IR RADIOLOGY PERIPHERAL GUIDED IV START  08/03/2017   IR US GUIDE VASC ACCESS RIGHT  08/03/2017   LAPAROTOMY     MASTECTOMY  2005   Left-axillary   MITRAL VALVE REPAIR Right 09/13/2017   Procedure: MINIMALLY INVASIVE MITRAL VALVE REPLACEMENT (MVR) with Medtronic Mosaic Porcine Heart Valve size 63mm;  Surgeon: Rexene Alberts, MD;  Location: [place];  Service: Open Heart Surgery;  Laterality: Right;   ORIF ANKLE FRACTURE Left 03/21/2014   Procedure: LEFT ANKLE FRACTURE OPEN TREATMENT BILMALLEOLAR INCLUDES INTERNAL FIXATION ;  Surgeon: Renette Butters, MD;  Location: [place];  Service: Orthopedics;  Laterality: Left;   PACEMAKER IMPLANT N/A 09/19/2017   Medtronic Azure XT MRI conditional  dual-chamber pacemaker for symptomatic complete heart block  by Dr Rayann Heman   RIGHT/LEFT HEART CATH AND CORONARY ANGIOGRAPHY N/A 06/27/2017   Procedure: Right/Left Heart Cath and Coronary Angiography;  Surgeon: Larey [name], MD;  Location: [place] CV [facility];  Service: Cardiovascular;  Laterality: N/A;   SPLENECTOMY  1974   hodgkins disease   TEE WITHOUT CARDIOVERSION  06/12/2012   Procedure: TRANSESOPHAGEAL ECHOCARDIOGRAM (TEE);  Surgeon: Larey [name], MD;  Location: [place];  Service: Cardiovascular;  Laterality: N/A;   TEE WITHOUT CARDIOVERSION N/A 06/29/2017   Procedure: TRANSESOPHAGEAL ECHOCARDIOGRAM (TEE);  Surgeon: Jerline Pain, MD;  Location: [facility] ENDOSCOPY;  Service: Cardiovascular;  Laterality: N/A;   TEE WITHOUT CARDIOVERSION N/A 09/13/2017   Procedure: TRANSESOPHAGEAL ECHOCARDIOGRAM (TEE);  Surgeon: Rexene Alberts, MD;  Location: [place];  Service: Open Heart Surgery;  Laterality: N/A;   TONSILLECTOMY AND ADENOIDECTOMY     Current Outpatient Medications on File Prior to Visit  Medication Sig Dispense Refill   acetaminophen (TYLENOL) 325 MG tablet Take 2 tablets (650 mg total) by mouth 2 (two) times daily as needed for moderate pain or headache.     alendronate (FOSAMAX) 70 MG tablet TAKE 1 TABLET BY MOUTH EVERY 7 DAYS. TAKE WITH A FULL GLASS OF WATER ON AN EMPTY STOMACH. 4 tablet 0   atorvastatin (LIPITOR) 10 MG tablet TAKE 1 TABLET BY MOUTH ONCE A DAY 30 tablet 0   Blood Glucose Monitoring Suppl (ACCU-CHEK GUIDE) w/Device KIT 1 Units by Does not apply route daily. 1 kit 0   cetirizine (ZYRTEC) 10 MG tablet Take 10 mg by mouth daily as needed for allergies.     chlorthalidone (HYGROTON) 25 MG tablet TAKE 1 TABLET BY MOUTH ONCE A DAY 90 tablet 2   cyclobenzaprine (FLEXERIL) 10 MG tablet Take 1 tablet (10 mg total) by mouth 3 (three) times daily as needed for muscle spasms. 30 tablet 0   esomeprazole (NEXIUM) 40 MG capsule Take 40 mg by mouth daily before breakfast.   12    fluticasone (FLONASE) 50 MCG/ACT nasal spray Place 2 sprays into both nostrils daily. 16 g 6   furosemide (LASIX) 40 MG tablet TAKE 1 TABLET BY MOUTH ONCE A DAY 90 tablet 2   glucose blood (ACCU-CHEK GUIDE) test strip USE TO TEST BLOOD SUGARS EVERY MORNING 100 each 3   HYDROcodone bit-homatropine (HYCODAN) 5-1.5 MG/5ML syrup Take 5 mLs by mouth every 8 (eight) hours as needed for cough. 120 mL 0   Lancets (ACCU-CHEK SOFT TOUCH) lancets Check BS QAM 100 each 3   Lancets Misc. (ACCU-CHEK SOFTCLIX LANCET DEV) KIT Check BS QAM 1 kit 0   levothyroxine (SYNTHROID) 50 MCG tablet TAKE 1 TABLET BY MOUTH ONCE A DAY 90 tablet 3   lipase/protease/amylase (CREON) 12000 units CPEP capsule Take 12,000 Units  by mouth 3 (three) times daily before meals.     metoprolol tartrate (LOPRESSOR) 100 MG tablet Take 1 tablet (100 mg total) by mouth 2 (two) times daily. 180 tablet 3   Multiple Vitamin (MULTIVITAMIN WITH MINERALS) TABS tablet Take 1 tablet by mouth daily. Centrum Silver.     potassium chloride SA (KLOR-CON M) 20 MEQ tablet TAKE 1 TABLET BY MOUTH ONCE A DAY 90 tablet 1   triamcinolone cream (KENALOG) 0.1 % Apply topically to affected itchy rash areas twice daily for 2 weeks. Avoid applying to face, groin, and axilla. Use as directed. 80 g 1   No current facility-administered medications on file prior to visit.     Allergies  Allergen Reactions   Azithromycin Other (See Comments)    pancreatitis   Penicillins Anaphylaxis, Hives, Swelling and Other (See Comments)    PATIENT HAS HAD A PCN REACTION WITH IMMEDIATE RASH, FACIAL/TONGUE/THROAT SWELLING, SOB, OR LIGHTHEADEDNESS WITH HYPOTENSION:  #  #  #  YES  #  #  #   HAS PT DEVELOPED SEVERE RASH INVOLVING MUCUS MEMBRANES or SKIN NECROSIS: #  #  #  YES  #  #  #  Has patient had a PCN reaction that required hospitalization: No Has patient had a PCN reaction occurring within the last 10 years: No    Sulfa Antibiotics Hives and Rash   Cheese Nausea And  Vomiting   Social History   Socioeconomic History   Marital status: Widowed    Spouse name: Not on file   Number of children: Not on file   Years of education: Not on file   Highest education level: Master's degree (e.g., MA, MS, MEng, MEd, MSW, MBA)  Occupational History   Occupation: Pharmacist, hospital (retired)  Tobacco Use   Smoking status: Never   Smokeless tobacco: Never  Vaping Use   Vaping Use: Never used  Substance and Sexual Activity   Alcohol use: No   Drug use: No   Sexual activity: Yes  Other Topics Concern   Not on file  Social History Narrative   Husband passed away from [place] in September 14, 2017.   Social Determinants of Health   Financial Resource Strain: Low Risk    Difficulty of Paying Living Expenses: Not hard at all  Food Insecurity: No Food Insecurity   Worried About Charity fundraiser in the Last Year: Never true   [place] in the Last Year: Never true  Transportation Needs: No Transportation Needs   Lack of Transportation (Medical): No   Lack of Transportation (Non-Medical): No  Physical Activity: Sufficiently Active   Days of Exercise per Week: 7 days   Minutes of Exercise per Session: 30 min  Stress: No Stress Concern Present   Feeling of Stress : Not at all  Social Connections: Moderately Integrated   Frequency of Communication with Friends and Family: More than three times a week   Frequency of Social Gatherings with Friends and Family: Three times a week   Attends Religious Services: More than 4 times per year   Active Member of Clubs or Organizations: Yes   Attends Archivist Meetings: More than 4 times per year   Marital Status: Widowed  Human resources officer Violence: Not At Risk   Fear of Current or Ex-Partner: No   Emotionally Abused: No   Physically Abused: No   Sexually Abused: No   Family History  Problem Relation Age of Onset   Rheumatic fever Father  Died suddenly age 72   Diabetes Father    Cancer Father        colon    Heart disease Father        rheumatic fever x 3   Arthritis Maternal Grandmother    Hypertension Maternal Grandmother    Diabetes Paternal Grandmother    Vision loss Paternal Grandmother        glaucoma      Review of Systems     Objective:   Physical Exam Vitals reviewed.  Constitutional:      General: She is not in acute distress.    Appearance: She is well-developed. She is not diaphoretic.  HENT:     Head: Normocephalic and atraumatic.  Neck:     Thyroid: No thyromegaly.     Vascular: No JVD.  Cardiovascular:     Rate and Rhythm: Normal rate and regular rhythm.     Heart sounds: Normal heart sounds. No murmur heard.   No friction rub. No gallop.  Pulmonary:     Effort: Pulmonary effort is normal. No respiratory distress.     Breath sounds: Normal breath sounds. No stridor. No wheezing or rales.  Chest:     Chest wall: No tenderness.  Abdominal:     General: Bowel sounds are normal. There is no distension.     Palpations: Abdomen is soft. There is no mass.     Tenderness: There is no abdominal tenderness. There is no guarding or rebound.  Musculoskeletal:        General: No tenderness. Normal range of motion.     Cervical back: Neck supple.  Lymphadenopathy:     Cervical: No cervical adenopathy.  Skin:    General: Skin is warm.     Coloration: Skin is not pale.     Findings: No erythema or rash.  Neurological:     Mental Status: She is alert and oriented to person, place, and time.     Cranial Nerves: No cranial nerve deficit.     Motor: No abnormal muscle tone.     Coordination: Coordination normal.     Deep Tendon Reflexes: Reflexes normal.  Psychiatric:        Behavior: Behavior normal.        Thought Content: Thought content normal.        Judgment: Judgment normal.          Assessment & Plan:  Dyspnea, unspecified type - Plan: BASIC METABOLIC PANEL WITH GFR, Brain natriuretic peptide, ECHOCARDIOGRAM COMPLETE Differential diagnosis includes  systolic heart failure, diastolic dysfunction, worsening pulmonary hypertension.  Continue Lasix 40 mg twice daily.  Check a BNP.  Check a BMP to monitor renal function and potassium on higher dose of Lasix and schedule the patient for an echocardiogram to determine the source of facilitate management for this.

## 2021-12-18 LAB — BASIC METABOLIC PANEL WITH GFR
BUN/Creatinine Ratio: 21 (calc) (ref 6–22)
BUN: 23 mg/dL (ref 7–25)
CO2: 32 mmol/L (ref 20–32)
Calcium: 9.8 mg/dL (ref 8.6–10.4)
Chloride: 85 mmol/L — ABNORMAL LOW (ref 98–110)
Creat: 1.07 mg/dL — ABNORMAL HIGH (ref 0.60–1.00)
Glucose, Bld: 157 mg/dL — ABNORMAL HIGH (ref 65–99)
Potassium: 4 mmol/L (ref 3.5–5.3)
Sodium: 130 mmol/L — ABNORMAL LOW (ref 135–146)
eGFR: 56 mL/min/{1.73_m2} — ABNORMAL LOW (ref >=60)

## 2021-12-18 LAB — BRAIN NATRIURETIC PEPTIDE: Brain Natriuretic Peptide: 229 pg/mL — ABNORMAL HIGH (ref <100)

## 2021-12-22 ENCOUNTER — Other Ambulatory Visit: Payer: Self-pay

## 2021-12-22 ENCOUNTER — Ambulatory Visit (HOSPITAL_COMMUNITY)
Admission: RE | Admit: 2021-12-22 | Discharge: 2021-12-22 | Disposition: A | Discharge status: Home / Self Care | Payer: Medicare PPO | Source: Ambulatory Visit | Attending: Family Medicine | Admitting: Family Medicine

## 2021-12-22 DIAGNOSIS — R0609 Other forms of dyspnea: Secondary | ICD-10-CM | POA: Diagnosis not present

## 2021-12-22 DIAGNOSIS — Z950 Presence of cardiac pacemaker: Secondary | ICD-10-CM | POA: Insufficient documentation

## 2021-12-22 DIAGNOSIS — Z953 Presence of xenogenic heart valve: Secondary | ICD-10-CM | POA: Diagnosis not present

## 2021-12-22 DIAGNOSIS — I082 Rheumatic disorders of both aortic and tricuspid valves: Secondary | ICD-10-CM | POA: Insufficient documentation

## 2021-12-22 DIAGNOSIS — I272 Pulmonary hypertension, unspecified: Secondary | ICD-10-CM | POA: Insufficient documentation

## 2021-12-22 DIAGNOSIS — J449 Chronic obstructive pulmonary disease, unspecified: Secondary | ICD-10-CM | POA: Diagnosis not present

## 2021-12-22 DIAGNOSIS — R0600 Dyspnea, unspecified: Secondary | ICD-10-CM | POA: Diagnosis not present

## 2021-12-22 DIAGNOSIS — I4892 Unspecified atrial flutter: Secondary | ICD-10-CM | POA: Insufficient documentation

## 2021-12-22 LAB — ECHOCARDIOGRAM COMPLETE
Area-P 1/2: 4.36 cm2
Calc EF: 54.9 %
MV VTI: 1.68 cm2
P 1/2 time: 163 msec
S' Lateral: 2.6 cm
Single Plane A2C EF: 53.1 %
Single Plane A4C EF: 52.5 %

## 2021-12-23 ENCOUNTER — Other Ambulatory Visit: Payer: Self-pay | Admitting: Family Medicine

## 2021-12-23 DIAGNOSIS — I351 Nonrheumatic aortic (valve) insufficiency: Secondary | ICD-10-CM

## 2021-12-26 NOTE — Progress Notes (Signed)
Cardiology Office Note   Date:  12/30/2021   ID:  Patricia [name], DOB 26-Jul-1951, MRN [id number]  PCP:  Susy Frizzle, MD  Cardiologist:   Minus Breeding, MD   Chief Complaint  Patient presents with   Aortic Insufficiency       History of Present Illness: Patricia [name] is a 71 y.o. 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  She has had atrial flutter ablated.   Since I last saw her she had an echo last week ordered by Dr. Dennard Schaumann.  She had moderate to severe AI.  She said that it was New Year's Day and she awoke and noticed that she had lower extremity swelling.  She is actually since that time had started sleeping on 2 or 3 pillows.  She did not have any chest discomfort, neck or arm discomfort.  She has not had any cough fevers or chills.  She has not noticed any palpitations, presyncope or syncope.  She was treated with Lasix.  This did improve her lower extremity swelling.  She did have a mildly elevated BNP.  Her kidney function was normal.    I did review the results of the echocardiogram.  Aortic valve regurgitation is moderate to severe.  The EF was 55 to 60%.  There was a Medtronic valve in the mitral position and he appeared to have normal function.  There is mild to moderate mitral regurgitation.  There is a question of flow reversal in the descending aorta.   Past Medical History:  Diagnosis Date   Atrial flutter ([place])    Ablated 1998 Dr. Caryl Comes   Basal cell carcinoma 06/05/2019   R low back, EDC 07/16/19   BCC (basal cell carcinoma) 07/21/2021   right mid back, [facility] 09/15/2021   Bleeding gastric ulcer    back in 2000   Breast cancer ([place])    COPD (chronic obstructive pulmonary disease) ([place])    from radiation   GERD (gastroesophageal reflux disease)    History of basal cell carcinoma (BCC) 03/22/2021   right neck and right medial ear, Moh's   Hodgkin's lymphoma ([place])    Treated with  radiation and chemo   Mitral regurgitation    pvc   Osteoporosis    lumbar verterbral fracture, left ankle fracture, dexa 2015   Pancreatitis    Pneumonia    Prediabetes    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 09/13/2017   25 mm Medtronic Mosaic porcine stented bioprosthetic tissue valve   Ulcer     Past Surgical History:  Procedure Laterality Date   BREAST SURGERY     breast cancer since 2005   left mastectomy   CARDIAC ELECTROPHYSIOLOGY MAPPING AND ABLATION  1999   CARDIAC VALVE REPLACEMENT N/A    Phreesia 03/19/2021   CATARACT EXTRACTION, BILATERAL     COLONOSCOPY     IR RADIOLOGY PERIPHERAL GUIDED IV START  08/03/2017   IR US GUIDE VASC ACCESS RIGHT  08/03/2017   LAPAROTOMY     MASTECTOMY  2005   Left-axillary   MITRAL VALVE REPAIR Right 09/13/2017   Procedure: MINIMALLY INVASIVE MITRAL VALVE REPLACEMENT (MVR) with Medtronic Mosaic Porcine Heart Valve size 13m;  Surgeon: ORexene Alberts MD;  [place]  Service: Open Heart Surgery;  Laterality: Right;   ORIF ANKLE FRACTURE Left 03/21/2014   Procedure: LEFT ANKLE FRACTURE OPEN TREATMENT BILMALLEOLAR INCLUDES INTERNAL  FIXATION ;  Surgeon: Renette Butters, MD;  Location: [place];  Service: Orthopedics;  Laterality: Left;   PACEMAKER IMPLANT N/A 09/19/2017   Medtronic Azure XT MRI conditional dual-chamber pacemaker for symptomatic complete heart block  by Dr Rayann Heman   RIGHT/LEFT HEART CATH AND CORONARY ANGIOGRAPHY N/A 06/27/2017   Procedure: Right/Left Heart Cath and Coronary Angiography;  Surgeon: Larey [name], MD;  Location: [place] [facility];  Service: Cardiovascular;  Laterality: N/A;   SPLENECTOMY  1974   hodgkins disease   TEE WITHOUT CARDIOVERSION  06/12/2012   Procedure: TRANSESOPHAGEAL ECHOCARDIOGRAM (TEE);  Surgeon: Larey [name], MD;  Location: [place];  Service: Cardiovascular;  Laterality: N/A;   TEE WITHOUT CARDIOVERSION N/A 06/29/2017   Procedure:  TRANSESOPHAGEAL ECHOCARDIOGRAM (TEE);  Surgeon: Jerline Pain, MD;  Location: [facility] ENDOSCOPY;  Service: Cardiovascular;  Laterality: N/A;   TEE WITHOUT CARDIOVERSION N/A 09/13/2017   Procedure: TRANSESOPHAGEAL ECHOCARDIOGRAM (TEE);  Surgeon: Rexene Alberts, MD;  Location: [place];  Service: Open Heart Surgery;  Laterality: N/A;   TONSILLECTOMY AND ADENOIDECTOMY       Current Outpatient Medications  Medication Sig Dispense Refill   acetaminophen (TYLENOL) 325 MG tablet Take 2 tablets (650 mg total) by mouth 2 (two) times daily as needed for moderate pain or headache.     alendronate (FOSAMAX) 70 MG tablet Take 1 tablet (70 mg total) by mouth once a week. 12 tablet 3   atorvastatin (LIPITOR) 10 MG tablet TAKE 1 TABLET BY MOUTH ONCE A DAY 30 tablet 0   Blood Glucose Monitoring Suppl (ACCU-CHEK GUIDE) w/Device KIT 1 Units by Does not apply route daily. 1 kit 0   chlorthalidone (HYGROTON) 25 MG tablet TAKE 1 TABLET BY MOUTH ONCE A DAY 90 tablet 2   cyclobenzaprine (FLEXERIL) 10 MG tablet Take 1 tablet (10 mg total) by mouth 3 (three) times daily as needed for muscle spasms. 30 tablet 0   esomeprazole (NEXIUM) 40 MG capsule Take 40 mg by mouth daily before breakfast.   12   fluticasone (FLONASE) 50 MCG/ACT nasal spray Place 2 sprays into both nostrils daily. (Patient taking differently: Place 2 sprays into both nostrils as needed.) 16 g 6   furosemide (LASIX) 40 MG tablet Take 2 tablets (80 mg total) by mouth daily. 180 tablet 2   glucose blood (ACCU-CHEK GUIDE) test strip USE TO TEST BLOOD SUGARS EVERY MORNING 100 each 3   Lancets (ACCU-CHEK SOFT TOUCH) lancets Check BS QAM 100 each 3   Lancets Misc. (ACCU-CHEK SOFTCLIX LANCET DEV) KIT Check BS QAM 1 kit 0   levothyroxine (SYNTHROID) 50 MCG tablet TAKE 1 TABLET BY MOUTH ONCE A DAY 90 tablet 3   lipase/protease/amylase (CREON) 12000 units CPEP capsule Take 12,000 Units by mouth 3 (three) times daily before meals.     metoprolol tartrate (LOPRESSOR)  100 MG tablet Take 1 tablet (100 mg total) by mouth 2 (two) times daily. 180 tablet 3   Multiple Vitamin (MULTIVITAMIN WITH MINERALS) TABS tablet Take 1 tablet by mouth daily. Centrum Silver.     potassium chloride SA (KLOR-CON M) 20 MEQ tablet TAKE 1 TABLET BY MOUTH ONCE A DAY 90 tablet 1   No current facility-administered medications for this visit.    Allergies:   Azithromycin, Penicillins, Sulfa antibiotics, and Cheese    ROS:  Please see the history of present illness.   Otherwise, review of systems are positive for none.   All other systems are reviewed and negative.  PHYSICAL EXAM: VS:  BP 128/72    Pulse 95    Ht _0  (1.676 m)    Wt 117 lb (53.1 kg)    SpO2 97%    BMI 18.88 kg/m  , BMI Body mass index is 18.88 kg/m. GENERAL:  Well appearing NECK:  No jugular venous distention, waveform within normal limits, carotid upstroke brisk and symmetric, no bruits, no thyromegaly LUNGS:  Clear to auscultation bilaterally CHEST:  Unremarkable HEART:  PMI not displaced or sustained,S1 and S2 within normal limits, no S3, no S4, no clicks, no rubs, she has a harsh third left intercostal space systolic murmur, no diastolic murmurs ABD:  Flat, positive bowel sounds normal in frequency in pitch, no bruits, no rebound, no guarding, no midline pulsatile mass, no hepatomegaly, no splenomegaly EXT:  2 plus pulses throughout, no edema, no cyanosis no clubbing  EKG:  EKG is not ordered today.    Recent Labs: 12/15/2021: ALT 17 12/17/2021: Brain Natriuretic Peptide 229; BUN 23; Creat 1.07; Potassium 4.0; Sodium 130 12/28/2021: Hemoglobin 12.4; Platelets 560    Lipid Panel    Component Value Date/Time   CHOL 136 03/25/2021 0811   TRIG 88 03/25/2021 0811   HDL 62 03/25/2021 0811   CHOLHDL 2.2 03/25/2021 0811   VLDL 31 (H) 07/07/2016 0847   LDLCALC 57 03/25/2021 0811      Wt Readings from Last 3 Encounters:  12/28/21 117 lb (53.1 kg)  12/17/21 119 lb (54 kg)  12/15/21 121 lb (54.9 kg)       Other studies Reviewed: Additional studies/ records that were reviewed today include: EKG from March, Labs. Review of the above records demonstrates:  Please see elsewhere in the note.     ASSESSMENT AND PLAN:  MVR:   She had stable replacement in 2019 but this will be evaluated as below.    AI:    This is now moderate to severe and I will follow up with a TEE.    PULMONARY HTN:    These were moderately elevated pulmonary pressures.    FLUTTER:    She has had no recurrence of this on her device.  She intentionally has been off of DOAC.  She had significant bleeding on aspirin so we are avoiding this.   HTN:   Blood pressures is at target.    CHB:  She has had normal pacemaker function in October 2022.      Current medicines are reviewed at length with the patient today.  The patient does not have concerns regarding medicines.  The following changes have been made:   Labs/ tests ordered today include:    Orders Placed This Encounter  Procedures   CBC with Differential/Platelet   EKG 12-Lead      Disposition:   FU with me after the echo.   Signed, Minus Breeding, MD  12/30/2021 10:04 PM    [place]

## 2021-12-26 NOTE — H&P (View-Only) (Signed)
Cardiology Office Note   Date:  12/30/2021   ID:  DAVEDA LAROCK, DOB 26-Jul-1951, MRN [id number]  PCP:  Susy Frizzle, MD  Cardiologist:   Minus Breeding, MD   Chief Complaint  Patient presents with   Aortic Insufficiency       History of Present Illness: Patricia [name] is a 71 y.o. 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  She has had atrial flutter ablated.   Since I last saw her she had an echo last week ordered by Dr. Dennard Schaumann.  She had moderate to severe AI.  She said that it was New Year's Day and she awoke and noticed that she had lower extremity swelling.  She is actually since that time had started sleeping on 2 or 3 pillows.  She did not have any chest discomfort, neck or arm discomfort.  She has not had any cough fevers or chills.  She has not noticed any palpitations, presyncope or syncope.  She was treated with Lasix.  This did improve her lower extremity swelling.  She did have a mildly elevated BNP.  Her kidney function was normal.    I did review the results of the echocardiogram.  Aortic valve regurgitation is moderate to severe.  The EF was 55 to 60%.  There was a Medtronic valve in the mitral position and he appeared to have normal function.  There is mild to moderate mitral regurgitation.  There is a question of flow reversal in the descending aorta.   Past Medical History:  Diagnosis Date   Atrial flutter ([place])    Ablated 1998 Dr. Caryl Comes   Basal cell carcinoma 06/05/2019   R low back, [facility] 07/16/19   BCC (basal cell carcinoma) 07/21/2021   right mid back, [facility] 09/15/2021   Bleeding gastric ulcer    back in 2000   Breast cancer ([place])    COPD (chronic obstructive pulmonary disease) ([place])    from radiation   GERD (gastroesophageal reflux disease)    History of basal cell carcinoma (BCC) 03/22/2021   right neck and right medial ear, Moh's   Hodgkin's lymphoma ([place])    Treated with  radiation and chemo   Mitral regurgitation    pvc   Osteoporosis    lumbar verterbral fracture, left ankle fracture, dexa 2015   Pancreatitis    Pneumonia    Prediabetes    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 09/13/2017   25 mm Medtronic Mosaic porcine stented bioprosthetic tissue valve   Ulcer     Past Surgical History:  Procedure Laterality Date   BREAST SURGERY     breast cancer since 2005   left mastectomy   CARDIAC ELECTROPHYSIOLOGY MAPPING AND ABLATION  1999   CARDIAC VALVE REPLACEMENT N/A    [employer] 03/19/2021   CATARACT EXTRACTION, BILATERAL     COLONOSCOPY     IR RADIOLOGY PERIPHERAL GUIDED IV START  08/03/2017   IR US GUIDE VASC ACCESS RIGHT  08/03/2017   LAPAROTOMY     MASTECTOMY  2005   Left-axillary   MITRAL VALVE REPAIR Right 09/13/2017   Procedure: MINIMALLY INVASIVE MITRAL VALVE REPLACEMENT (MVR) with Medtronic Mosaic Porcine Heart Valve size 13m;  Surgeon: ORexene Alberts MD;  [place] [facility]  Service: Open Heart Surgery;  Laterality: Right;   ORIF ANKLE FRACTURE Left 03/21/2014   Procedure: LEFT ANKLE FRACTURE OPEN TREATMENT BILMALLEOLAR INCLUDES INTERNAL  FIXATION ;  Surgeon: Renette Butters, MD;  Location: [place];  Service: Orthopedics;  Laterality: Left;   PACEMAKER IMPLANT N/A 09/19/2017   Medtronic Azure XT MRI conditional dual-chamber pacemaker for symptomatic complete heart block  by Dr Rayann Heman   RIGHT/LEFT HEART CATH AND CORONARY ANGIOGRAPHY N/A 06/27/2017   Procedure: Right/Left Heart Cath and Coronary Angiography;  Surgeon: Larey [name], MD;  Location: [facility];  Service: Cardiovascular;  Laterality: N/A;   SPLENECTOMY  1974   hodgkins disease   TEE WITHOUT CARDIOVERSION  06/12/2012   Procedure: TRANSESOPHAGEAL ECHOCARDIOGRAM (TEE);  Surgeon: Larey [name], MD;  Location: [place];  Service: Cardiovascular;  Laterality: N/A;   TEE WITHOUT CARDIOVERSION N/A 06/29/2017   Procedure:  TRANSESOPHAGEAL ECHOCARDIOGRAM (TEE);  Surgeon: Jerline Pain, MD;  Location: [facility] END[facility];  Service: Cardiovascular;  Laterality: N/A;   TEE WITHOUT CARDIOVERSION N/A 09/13/2017   Procedure: TRANSESOPHAGEAL ECHOCARDIOGRAM (TEE);  Surgeon: Rexene Alberts, MD;  Location: [place];  Service: Open Heart Surgery;  Laterality: N/A;   TONSILLECTOMY AND ADENOIDECTOMY       Current Outpatient Medications  Medication Sig Dispense Refill   acetaminophen (TYLENOL) 325 MG tablet Take 2 tablets (650 mg total) by mouth 2 (two) times daily as needed for moderate pain or headache.     alendronate (FOSAMAX) 70 MG tablet Take 1 tablet (70 mg total) by mouth once a week. 12 tablet 3   atorvastatin (LIPITOR) 10 MG tablet TAKE 1 TABLET BY MOUTH ONCE A DAY 30 tablet 0   Blood Glucose Monitoring Suppl (ACCU-CHEK GUIDE) w/Device KIT 1 Units by Does not apply route daily. 1 kit 0   chlorthalidone (HYGROTON) 25 MG tablet TAKE 1 TABLET BY MOUTH ONCE A DAY 90 tablet 2   cyclobenzaprine (FLEXERIL) 10 MG tablet Take 1 tablet (10 mg total) by mouth 3 (three) times daily as needed for muscle spasms. 30 tablet 0   esomeprazole (NEXIUM) 40 MG capsule Take 40 mg by mouth daily before breakfast.   12   fluticasone (FLONASE) 50 MCG/ACT nasal spray Place 2 sprays into both nostrils daily. (Patient taking differently: Place 2 sprays into both nostrils as needed.) 16 g 6   furosemide (LASIX) 40 MG tablet Take 2 tablets (80 mg total) by mouth daily. 180 tablet 2   glucose blood (ACCU-CHEK GUIDE) test strip USE TO TEST BLOOD SUGARS EVERY MORNING 100 each 3   Lancets (ACCU-CHEK SOFT TOUCH) lancets Check BS QAM 100 each 3   Lancets Misc. (ACCU-CHEK SOFTCLIX LANCET DEV) KIT Check BS QAM 1 kit 0   levothyroxine (SYNTHROID) 50 MCG tablet TAKE 1 TABLET BY MOUTH ONCE A DAY 90 tablet 3   lipase/protease/amylase (CREON) 12000 units CPEP capsule Take 12,000 Units by mouth 3 (three) times daily before meals.     metoprolol tartrate (LOPRESSOR)  100 MG tablet Take 1 tablet (100 mg total) by mouth 2 (two) times daily. 180 tablet 3   Multiple Vitamin (MULTIVITAMIN WITH MINERALS) TABS tablet Take 1 tablet by mouth daily. Centrum Silver.     potassium chloride SA (KLOR-CON M) 20 MEQ tablet TAKE 1 TABLET BY MOUTH ONCE A DAY 90 tablet 1   No current facility-administered medications for this visit.    Allergies:   Azithromycin, Penicillins, Sulfa antibiotics, and Cheese    ROS:  Please see the history of present illness.   Otherwise, review of systems are positive for none.   All other systems are reviewed and negative.  PHYSICAL EXAM: VS:  BP 128/72    Pulse 95    Ht _0  (1.676 m)    Wt 117 lb (53.1 kg)    SpO2 97%    BMI 18.88 kg/m  , BMI Body mass index is 18.88 kg/m. GENERAL:  Well appearing NECK:  No jugular venous distention, waveform within normal limits, carotid upstroke brisk and symmetric, no bruits, no thyromegaly LUNGS:  Clear to auscultation bilaterally CHEST:  Unremarkable HEART:  PMI not displaced or sustained,S1 and S2 within normal limits, no S3, no S4, no clicks, no rubs, she has a harsh third left intercostal space systolic murmur, no diastolic murmurs ABD:  Flat, positive bowel sounds normal in frequency in pitch, no bruits, no rebound, no guarding, no midline pulsatile mass, no hepatomegaly, no splenomegaly EXT:  2 plus pulses throughout, no edema, no cyanosis no clubbing  EKG:  EKG is not ordered today.    Recent Labs: 12/15/2021: ALT 17 12/17/2021: Brain Natriuretic Peptide 229; BUN 23; Creat 1.07; Potassium 4.0; Sodium 130 12/28/2021: Hemoglobin 12.4; Platelets 560    Lipid Panel    Component Value Date/Time   CHOL 136 03/25/2021 0811   TRIG 88 03/25/2021 0811   HDL 62 03/25/2021 0811   CHOLHDL 2.2 03/25/2021 0811   VLDL 31 (H) 07/07/2016 0847   LDLCALC 57 03/25/2021 0811      Wt Readings from Last 3 Encounters:  12/28/21 117 lb (53.1 kg)  12/17/21 119 lb (54 kg)  12/15/21 121 lb (54.9 kg)       Other studies Reviewed: Additional studies/ records that were reviewed today include: EKG from March, Labs. Review of the above records demonstrates:  Please see elsewhere in the note.     ASSESSMENT AND PLAN:  MVR:   She had stable replacement in 2019 but this will be evaluated as below.    AI:    This is now moderate to severe and I will follow up with a TEE.    PULMONARY HTN:    These were moderately elevated pulmonary pressures.    FLUTTER:    She has had no recurrence of this on her device.  She intentionally has been off of DOAC.  She had significant bleeding on aspirin so we are avoiding this.   HTN:   Blood pressures is at target.    CHB:  She has had normal pacemaker function in October 2022.      Current medicines are reviewed at length with the patient today.  The patient does not have concerns regarding medicines.  The following changes have been made:   Labs/ tests ordered today include:    Orders Placed This Encounter  Procedures   CBC with Differential/Platelet   EKG 12-Lead      Disposition:   FU with me after the echo.   Signed, Minus Breeding, MD  12/30/2021 10:04 PM    [place]

## 2021-12-27 DIAGNOSIS — I442 Atrioventricular block, complete: Secondary | ICD-10-CM | POA: Insufficient documentation

## 2021-12-28 ENCOUNTER — Encounter: Payer: Self-pay | Admitting: Cardiology

## 2021-12-28 ENCOUNTER — Ambulatory Visit: Payer: Medicare PPO | Admitting: Cardiology

## 2021-12-28 ENCOUNTER — Ambulatory Visit (INDEPENDENT_AMBULATORY_CARE_PROVIDER_SITE_OTHER): Payer: Medicare PPO

## 2021-12-28 ENCOUNTER — Other Ambulatory Visit: Payer: Self-pay

## 2021-12-28 VITALS — BP 128/72 | HR 95 | Ht 66.0 in | Wt 117.0 lb

## 2021-12-28 DIAGNOSIS — I351 Nonrheumatic aortic (valve) insufficiency: Secondary | ICD-10-CM | POA: Diagnosis not present

## 2021-12-28 DIAGNOSIS — I442 Atrioventricular block, complete: Secondary | ICD-10-CM | POA: Diagnosis not present

## 2021-12-28 DIAGNOSIS — Z9889 Other specified postprocedural states: Secondary | ICD-10-CM | POA: Diagnosis not present

## 2021-12-28 DIAGNOSIS — I272 Pulmonary hypertension, unspecified: Secondary | ICD-10-CM | POA: Diagnosis not present

## 2021-12-28 DIAGNOSIS — I10 Essential (primary) hypertension: Secondary | ICD-10-CM | POA: Diagnosis not present

## 2021-12-28 DIAGNOSIS — I4892 Unspecified atrial flutter: Secondary | ICD-10-CM | POA: Diagnosis not present

## 2021-12-28 DIAGNOSIS — Z01812 Encounter for preprocedural laboratory examination: Secondary | ICD-10-CM

## 2021-12-28 DIAGNOSIS — Z01818 Encounter for other preprocedural examination: Secondary | ICD-10-CM | POA: Diagnosis not present

## 2021-12-28 LAB — CUP PACEART REMOTE DEVICE CHECK
Battery Remaining Longevity: 76 mo
Battery Voltage: 2.97 V
Brady Statistic AP VP Percent: 0.02 %
Brady Statistic AP VS Percent: 0 %
Brady Statistic AS VP Percent: 99.94 %
Brady Statistic AS VS Percent: 0.04 %
Brady Statistic RA Percent Paced: 0.02 %
Brady Statistic RV Percent Paced: 99.96 %
Date Time Interrogation Session: 20230117002135
Implantable Lead Implant Date: 20181009
Implantable Lead Implant Date: 20181009
Implantable Lead Location: 753859
Implantable Lead Location: 753860
Implantable Lead Model: 5076
Implantable Lead Model: 5076
Implantable Pulse Generator Implant Date: 20181009
Lead Channel Impedance Value: 285 Ohm
Lead Channel Impedance Value: 342 Ohm
Lead Channel Impedance Value: 399 Ohm
Lead Channel Impedance Value: 418 Ohm
Lead Channel Pacing Threshold Amplitude: 0.625 V
Lead Channel Pacing Threshold Amplitude: 0.75 V
Lead Channel Pacing Threshold Pulse Width: 0.4 ms
Lead Channel Pacing Threshold Pulse Width: 0.4 ms
Lead Channel Sensing Intrinsic Amplitude: 2 mV
Lead Channel Sensing Intrinsic Amplitude: 2 mV
Lead Channel Sensing Intrinsic Amplitude: 3.625 mV
Lead Channel Setting Pacing Amplitude: 1.5 V
Lead Channel Setting Pacing Amplitude: 2.5 V
Lead Channel Setting Pacing Pulse Width: 0.4 ms
Lead Channel Setting Sensing Sensitivity: 2 mV

## 2021-12-28 NOTE — Patient Instructions (Signed)
Medication Instructions:  Your physician recommends that you continue on your current medications as directed. Please refer to the Current Medication list given to you today.   *If you need a refill on your cardiac medications before your next appointment, please call your pharmacy*   Lab Work: [place] TEE  If you have labs (blood work) drawn today and your tests are completely normal, you will receive your results only by: [place] (if you have MyChart) OR A paper copy in the mail If you have any lab test that is abnormal or we need to change your treatment, we will call you to review the results.  Testing/Procedures: TEE  WILL CALL YOU WITH DATE AND TIME   Follow-Up: WILL CALL YOU WITH APPOINTMENT     :1}    Other Instructions  Transesophageal Echocardiogram Transesophageal echocardiogram (TEE) is a test that uses sound waves to take pictures of your heart. TEE is done by passing a small probe attached to a flexible tube down the part of the body that moves food from your mouth to your stomach (esophagus). The pictures give clear images of your heart. This can help your doctor see if there are problems with your heart. Tell a doctor about: Any allergies you have. All medicines you are taking. This includes vitamins, herbs, eye drops, creams, and over-the-counter medicines. Any problems you or family members have had with anesthetic medicines. Any blood disorders you have. Any surgeries you have had. Any medical conditions you have. Any swallowing problems. Whether you have or have had a blockage in the part of the body that moves food from your mouth to your stomach. Whether you are pregnant or may be pregnant. What are the risks? In general, this is a safe procedure. But, problems may occur, such as: Damage to nearby structures or organs. A tear in the part of the body that moves food from your mouth to your stomach. Irregular heartbeat. Hoarse voice  or trouble swallowing. Bleeding. What happens before the procedure? Medicines Ask your doctor about changing or stopping: Your normal medicines. Vitamins, herbs, and supplements. Over-the-counter medicines. Do not take aspirin or ibuprofen unless you are told to. General instructions Follow instructions from your doctor about what you cannot eat or drink. You will take out any dentures or dental retainers. Plan to have a responsible adult take you home from the hospital or clinic. Plan to have a responsible adult care for you for the time you are told after you leave the hospital or clinic. This is important. What happens during the procedure?  An IV will be put into one of your veins. You may be given: A sedative. This medicine helps you relax. A medicine to numb the back of your throat. This may be sprayed or gargled. Your blood pressure, heart rate, and breathing will be watched. You may be asked to lie on your left side. A bite block will be placed in your mouth. This keeps you from biting the tube. The tip of the probe will be placed into the back of your mouth. You will be asked to swallow. Your doctor will take pictures of your heart. The probe and bite block will be taken out after the test is done. The procedure may vary among doctors and hospitals. What can I expect after the procedure? You will be monitored until you leave the hospital or clinic. This includes checking your blood pressure, heart rate, breathing rate, and blood oxygen level. Your  throat may feel sore and numb. This will get better over time. You will not be allowed to eat or drink until the numbness has gone away. It is common to have a sore throat for a day or two. It is up to you to get the results of your procedure. Ask how to get your results when they are ready. Follow these instructions at home: If you were given a sedative during your procedure, do not drive or use machines until your doctor says  that it is safe. Return to your normal activities when your doctor says that it is safe. Keep all follow-up visits. Summary TEE is a test that uses sound waves to take pictures of your heart. You will be given a medicine to help you relax. Do not drive or use machines until your doctor says that it is safe. This information is not intended to replace advice given to you by your health care provider. Make sure you discuss any questions you have with your health care provider. Document Revised: 08/11/2021 Document Reviewed: 07/21/2020 Elsevier Patient Education  2022 [employer].

## 2021-12-29 ENCOUNTER — Encounter (HOSPITAL_BASED_OUTPATIENT_CLINIC_OR_DEPARTMENT_OTHER): Payer: Self-pay

## 2021-12-29 ENCOUNTER — Telehealth (HOSPITAL_BASED_OUTPATIENT_CLINIC_OR_DEPARTMENT_OTHER): Payer: Self-pay | Admitting: *Deleted

## 2021-12-29 DIAGNOSIS — I10 Essential (primary) hypertension: Secondary | ICD-10-CM

## 2021-12-29 DIAGNOSIS — Z01812 Encounter for preprocedural laboratory examination: Secondary | ICD-10-CM

## 2021-12-29 LAB — CBC WITH DIFFERENTIAL/PLATELET
Basophils Absolute: 0.1 10*3/uL (ref 0.0–0.2)
Basos: 1 %
EOS (ABSOLUTE): 0.4 10*3/uL (ref 0.0–0.4)
Eos: 4 %
Hematocrit: 36 % (ref 34.0–46.6)
Hemoglobin: 12.4 g/dL (ref 11.1–15.9)
Immature Grans (Abs): 0 10*3/uL (ref 0.0–0.1)
Immature Granulocytes: 0 %
Lymphocytes Absolute: 1.7 10*3/uL (ref 0.7–3.1)
Lymphs: 17 %
MCH: 29.8 pg (ref 26.6–33.0)
MCHC: 34.4 g/dL (ref 31.5–35.7)
MCV: 87 fL (ref 79–97)
Monocytes Absolute: 1.3 10*3/uL — ABNORMAL HIGH (ref 0.1–0.9)
Monocytes: 13 %
Neutrophils Absolute: 6.5 10*3/uL (ref 1.4–7.0)
Neutrophils: 65 %
Platelets: 560 10*3/uL — ABNORMAL HIGH (ref 150–450)
RBC: 4.16 x10E6/uL (ref 3.77–5.28)
RDW: 12.7 % (ref 11.7–15.4)
WBC: 10 10*3/uL (ref 3.4–10.8)

## 2021-12-29 NOTE — Telephone Encounter (Signed)
Patient was seen by Dr Percival Spanish 1/17 and needed TEE  Was after 5 and was unable to schedule Patient has been scheduled and instructions/location reviewed She will go for labs within 1 week of TEE  Letter also sent in mychart which she confirmed she uses  Scheduled 12/1 at 70 with Dr Audie Box, first available

## 2021-12-30 ENCOUNTER — Encounter: Payer: Self-pay | Admitting: Cardiology

## 2021-12-30 NOTE — Telephone Encounter (Signed)
Spoke with pt, Follow up scheduled  

## 2022-01-04 ENCOUNTER — Encounter (HOSPITAL_COMMUNITY): Payer: Self-pay | Admitting: Cardiovascular Disease

## 2022-01-07 DIAGNOSIS — I10 Essential (primary) hypertension: Secondary | ICD-10-CM | POA: Diagnosis not present

## 2022-01-07 DIAGNOSIS — Z01812 Encounter for preprocedural laboratory examination: Secondary | ICD-10-CM | POA: Diagnosis not present

## 2022-01-07 LAB — CBC WITH DIFFERENTIAL/PLATELET
Basophils Absolute: 0.1 10*3/uL (ref 0.0–0.2)
Basos: 1 %
EOS (ABSOLUTE): 0.3 10*3/uL (ref 0.0–0.4)
Eos: 3 %
Hematocrit: 34.7 % (ref 34.0–46.6)
Hemoglobin: 12.3 g/dL (ref 11.1–15.9)
Immature Grans (Abs): 0 10*3/uL (ref 0.0–0.1)
Immature Granulocytes: 0 %
Lymphocytes Absolute: 1.2 10*3/uL (ref 0.7–3.1)
Lymphs: 13 %
MCH: 30.4 pg (ref 26.6–33.0)
MCHC: 35.4 g/dL (ref 31.5–35.7)
MCV: 86 fL (ref 79–97)
Monocytes Absolute: 1.1 10*3/uL — ABNORMAL HIGH (ref 0.1–0.9)
Monocytes: 12 %
Neutrophils Absolute: 6.5 10*3/uL (ref 1.4–7.0)
Neutrophils: 71 %
Platelets: 451 10*3/uL — ABNORMAL HIGH (ref 150–450)
RBC: 4.05 x10E6/uL (ref 3.77–5.28)
RDW: 12 % (ref 11.7–15.4)
WBC: 9.2 10*3/uL (ref 3.4–10.8)

## 2022-01-07 LAB — BASIC METABOLIC PANEL
BUN/Creatinine Ratio: 23 (ref 12–28)
BUN: 28 mg/dL — ABNORMAL HIGH (ref 8–27)
CO2: 32 mmol/L — ABNORMAL HIGH (ref 20–29)
Calcium: 10.7 mg/dL — ABNORMAL HIGH (ref 8.7–10.3)
Chloride: 95 mmol/L — ABNORMAL LOW (ref 96–106)
Creatinine, Ser: 1.21 mg/dL — ABNORMAL HIGH (ref 0.57–1.00)
Glucose: 127 mg/dL — ABNORMAL HIGH (ref 70–99)
Potassium: 4.4 mmol/L (ref 3.5–5.2)
Sodium: 140 mmol/L (ref 134–144)
eGFR: 48 mL/min/{1.73_m2} — ABNORMAL LOW (ref >59)

## 2022-01-10 NOTE — Progress Notes (Signed)
Remote pacemaker transmission.   

## 2022-01-12 ENCOUNTER — Encounter (HOSPITAL_COMMUNITY): Payer: Self-pay | Admitting: Cardiovascular Disease

## 2022-01-12 ENCOUNTER — Ambulatory Visit (HOSPITAL_BASED_OUTPATIENT_CLINIC_OR_DEPARTMENT_OTHER)
Admission: RE | Admit: 2022-01-12 | Discharge: 2022-01-12 | Disposition: A | Discharge status: Home / Self Care | Payer: Medicare PPO | Source: Home / Self Care | Attending: Cardiovascular Disease | Admitting: Cardiovascular Disease

## 2022-01-12 ENCOUNTER — Ambulatory Visit (HOSPITAL_COMMUNITY): Payer: Medicare PPO | Admitting: Anesthesiology

## 2022-01-12 ENCOUNTER — Ambulatory Visit (HOSPITAL_COMMUNITY)
Admission: RE | Admit: 2022-01-12 | Discharge: 2022-01-12 | Disposition: A | Discharge status: Home / Self Care | Payer: Medicare PPO | Attending: Cardiovascular Disease | Admitting: Cardiovascular Disease

## 2022-01-12 ENCOUNTER — Other Ambulatory Visit (HOSPITAL_COMMUNITY): Payer: Self-pay | Admitting: Cardiovascular Disease

## 2022-01-12 ENCOUNTER — Other Ambulatory Visit: Payer: Self-pay

## 2022-01-12 ENCOUNTER — Encounter (HOSPITAL_COMMUNITY)
Admission: RE | Disposition: A | Discharge status: Home / Self Care | Payer: Self-pay | Source: Home / Self Care | Attending: Cardiovascular Disease

## 2022-01-12 DIAGNOSIS — I700 Atherosclerosis of aorta: Secondary | ICD-10-CM | POA: Insufficient documentation

## 2022-01-12 DIAGNOSIS — I10 Essential (primary) hypertension: Secondary | ICD-10-CM | POA: Diagnosis not present

## 2022-01-12 DIAGNOSIS — I272 Pulmonary hypertension, unspecified: Secondary | ICD-10-CM | POA: Diagnosis not present

## 2022-01-12 DIAGNOSIS — I351 Nonrheumatic aortic (valve) insufficiency: Secondary | ICD-10-CM | POA: Insufficient documentation

## 2022-01-12 DIAGNOSIS — I442 Atrioventricular block, complete: Secondary | ICD-10-CM | POA: Insufficient documentation

## 2022-01-12 DIAGNOSIS — I088 Other rheumatic multiple valve diseases: Secondary | ICD-10-CM | POA: Diagnosis not present

## 2022-01-12 DIAGNOSIS — Z950 Presence of cardiac pacemaker: Secondary | ICD-10-CM | POA: Diagnosis not present

## 2022-01-12 DIAGNOSIS — Z952 Presence of prosthetic heart valve: Secondary | ICD-10-CM | POA: Diagnosis not present

## 2022-01-12 DIAGNOSIS — Z794 Long term (current) use of insulin: Secondary | ICD-10-CM | POA: Diagnosis not present

## 2022-01-12 DIAGNOSIS — E119 Type 2 diabetes mellitus without complications: Secondary | ICD-10-CM | POA: Diagnosis not present

## 2022-01-12 HISTORY — PX: BUBBLE STUDY: SHX6837

## 2022-01-12 HISTORY — PX: TEE WITHOUT CARDIOVERSION: SHX5443

## 2022-01-12 LAB — ECHO TEE
AR max vel: 1.32 cm2
AV Area VTI: 1.2 cm2
AV Area mean vel: 1.43 cm2
AV Mean grad: 12 mmHg
AV Peak grad: 19.2 mmHg
Ao pk vel: 2.19 m/s
MV VTI: 1.52 cm2
P 1/2 time: 242 msec

## 2022-01-12 SURGERY — ECHOCARDIOGRAM, TRANSESOPHAGEAL
Anesthesia: Monitor Anesthesia Care

## 2022-01-12 MED ORDER — LIDOCAINE HCL (CARDIAC) PF 100 MG/5ML IV SOSY
PREFILLED_SYRINGE | INTRAVENOUS | Status: DC | PRN
  Administered 2022-01-12: 40 mg via INTRAVENOUS

## 2022-01-12 MED ORDER — PROPOFOL 10 MG/ML IV BOLUS
INTRAVENOUS | Status: DC | PRN
Start: 2022-01-12 — End: 2022-01-12
  Administered 2022-01-12: 30 mg via INTRAVENOUS
  Administered 2022-01-12: 40 mg via INTRAVENOUS

## 2022-01-12 MED ORDER — PHENYLEPHRINE 40 MCG/ML (10ML) SYRINGE FOR IV PUSH (FOR BLOOD PRESSURE SUPPORT)
PREFILLED_SYRINGE | INTRAVENOUS | Status: DC | PRN
  Administered 2022-01-12 (×2): 80 ug via INTRAVENOUS

## 2022-01-12 MED ORDER — SODIUM CHLORIDE 0.9 % IV SOLN: INTRAVENOUS | Status: DC

## 2022-01-12 MED ORDER — PROPOFOL 500 MG/50ML IV EMUL
INTRAVENOUS | Status: DC | PRN
  Administered 2022-01-12: 100 ug/kg/min via INTRAVENOUS

## 2022-01-12 NOTE — Transfer of Care (Signed)
Immediate Anesthesia Transfer of Care Note  Patient: Patricia [name]  Procedure(s) Performed: TRANSESOPHAGEAL ECHOCARDIOGRAM (TEE) BUBBLE STUDY  Patient Location: PACU  Anesthesia Type:General  Level of Consciousness: drowsy  Airway & Oxygen Therapy: Patient Spontanous Breathing  Post-op Assessment: Report given to RN and Post -op Vital signs reviewed and stable  Post vital signs: Reviewed and stable  Last Vitals:  Vitals Value Taken Time  BP    Temp    Pulse    Resp    SpO2      Last Pain:  Vitals:   01/12/22 1111  TempSrc: Temporal  PainSc: 0-No pain         Complications: No notable events documented.

## 2022-01-12 NOTE — Interval H&P Note (Signed)
History and Physical Interval Note:  01/12/2022 11:01 AM  Patricia [name]  has presented today for surgery, with the diagnosis of NONRHEUMATIC AORTIC VALVE INSUFFICIENCY.  The various methods of treatment have been discussed with the patient and family. After consideration of risks, benefits and other options for treatment, the patient has consented to  Procedure(s): TRANSESOPHAGEAL ECHOCARDIOGRAM (TEE) (N/A) as a surgical intervention.  The patient's history has been reviewed, patient examined, no change in status, stable for surgery.  I have reviewed the patient's chart and labs.  Questions were answered to the patient's satisfaction.     NPO for TEE. AI. History of gastritis in past but treated.   [place] T. Audie Box, MD, [place]  [address] [place], [place] [address] [phone]  11:01 AM

## 2022-01-12 NOTE — CV Procedure (Signed)
° ° °  TRANSESOPHAGEAL ECHOCARDIOGRAM   NAME:  Patricia [name]    MRN: [id number] DOB:  22-Aug-1951    ADMIT DATE: 01/12/2022  INDICATIONS: Aortic regurgitation  PROCEDURE:   Informed consent was obtained prior to the procedure. The risks, benefits and alternatives for the procedure were discussed and the patient comprehended these risks.  Risks include, but are not limited to, cough, sore throat, vomiting, nausea, somnolence, esophageal and stomach trauma or perforation, bleeding, low blood pressure, aspiration, pneumonia, infection, trauma to the teeth and death.    Procedural time out performed. The oropharynx was anesthetized with topical 1% benzocaine.    Anesthesia was administered by Dr. Doroteo Glassman.  The patient was administered 250 mg of propofol and 40 mg of lidocaine to achieve and maintain moderate conscious sedation.  The patient's heart rate, blood pressure, and oxygen saturation are monitored continuously during the procedure. The period of conscious sedation is 30 minutes, of which I was present face-to-face 100% of this time.   The transesophageal probe was inserted in the esophagus and stomach without difficulty and multiple views were obtained.   COMPLICATIONS:    There were no immediate complications.  KEY FINDINGS:  Moderate to severe AI. Likely severe. Holodiastolic flow reversal in descending aorta.  Normal MV prosthesis.  Negative bubble study.  Full report to follow. Further management per primary team.   [place] B[place] T. Audie Box, MD, [place]  [address], [place], [place] [address] [phone]  12:29 PM

## 2022-01-12 NOTE — Anesthesia Preprocedure Evaluation (Signed)
Anesthesia Evaluation  Patient identified by MRN, date of birth, ID band Patient awake    Reviewed: Allergy & Precautions, NPO status , Patient's Chart, lab work & pertinent test results, reviewed documented beta blocker date and time   Airway Mallampati: I  TM Distance: >3 FB Neck ROM: Full    Dental no notable dental hx. (+) Teeth Intact, Dental Advisory Given   Pulmonary COPD,    Pulmonary exam normal breath sounds clear to auscultation       Cardiovascular hypertension, Pt. on medications and Pt. on home beta blockers Normal cardiovascular exam+ dysrhythmias (s/p ablation 1998) Atrial Fibrillation + pacemaker + Valvular Problems/Murmurs (mod-severe AI, mod TR. s/p MVR 2018) AI  Rhythm:Regular Rate:Normal  Last echo: 1. There is moderate to severe AI present. Best seen in the PLAX.  Difficult to assess due to turbulent flow from MV prosthesis into the LV.  Mechanism possibly related to damage to the aorto-mitral continuity from  MV prosthesis (did not have significant  AI prior to MV surgery). PHT ~220 msec. There is concerns for  holodiastolic flow reveral in the descending aorta. TEE is recommended for  clarification. The aortic valve is tricuspid. Aortic valve regurgitation  is moderate to severe.  2. Left ventricular ejection fraction, by estimation, is 55 to 60%. The  left ventricle has normal function. Left ventricular endocardial border  not optimally defined to evaluate regional wall motion. Left ventricular  diastolic function could not be  evaluated.  3. Right ventricular systolic function is mildly reduced. The right  ventricular size is mildly enlarged. There is moderately elevated  pulmonary artery systolic pressure. The estimated right ventricular  systolic pressure is 25.9 mmHg.  4. Right atrial size was mildly dilated.  5. 25 mm Medtronic mosaic valve in the mitral position. MG 6 mmHG @ 91  bpm. Emax 1.8  m/s, EOA 1.68 cm2, PHT 57 msec. All consistent with normal  prosthetic valve function. The mitral valve has been repaired/replaced. No  evidence of mitral valve  regurgitation. There is a 25 mm Medtronic Mosaic Porcine present in the  mitral position. Procedure Date: 09/13/2017.  6. Tricuspid valve regurgitation is mild to moderate.  7. Pulmonic valve regurgitation is moderate.  8. The inferior vena cava is dilated in size with <50% respiratory  variability, suggesting right atrial pressure of 15 mmHg.    Neuro/Psych negative neurological ROS  negative psych ROS   GI/Hepatic Neg liver ROS, PUD, GERD  Controlled and Medicated,  Endo/Other  diabetes, Type 2, Insulin Dependent  Renal/GU Renal disease (cr 1.21)  negative genitourinary   Musculoskeletal negative musculoskeletal ROS (+)   Abdominal   Peds negative pediatric ROS (+)  Hematology negative hematology ROS (+) Hx hodgkins lymphoma s/p chemorad   Anesthesia Other Findings   Reproductive/Obstetrics negative OB ROS                             Anesthesia Physical Anesthesia Plan  ASA: 4  Anesthesia Plan: MAC   Post-op Pain Management:    Induction:   PONV Risk Score and Plan: 2 and Propofol infusion and TIVA  Airway Management Planned: Natural Airway and Simple Face Mask  Additional Equipment: None  Intra-op Plan:   Post-operative Plan:   Informed Consent: I have reviewed the patients History and Physical, chart, labs and discussed the procedure including the risks, benefits and alternatives for the proposed anesthesia with the patient or authorized representative who has indicated  his/her understanding and acceptance.       Plan Discussed with: CRNA  Anesthesia Plan Comments:         Anesthesia Quick Evaluation

## 2022-01-12 NOTE — Anesthesia Postprocedure Evaluation (Signed)
Anesthesia Post Note  Patient: Patricia [name]  Procedure(s) Performed: TRANSESOPHAGEAL ECHOCARDIOGRAM (TEE) BUBBLE STUDY     Patient location during evaluation: PACU Anesthesia Type: MAC Level of consciousness: awake and alert Pain management: pain level controlled Vital Signs Assessment: post-procedure vital signs reviewed and stable Respiratory status: spontaneous breathing, nonlabored ventilation and respiratory function stable Cardiovascular status: blood pressure returned to baseline and stable Postop Assessment: no apparent nausea or vomiting Anesthetic complications: no   No notable events documented.  Last Vitals:  Vitals:   01/12/22 1250 01/12/22 1305  BP: (!) 130/58 (!) 142/66  Pulse: 85 85  Resp: 16 14  Temp:  (!) 36.2 C  SpO2: 99% 99%    Last Pain:  Vitals:   01/12/22 1305  TempSrc:   PainSc: 0-No pain                 Pervis Hocking

## 2022-01-12 NOTE — Progress Notes (Signed)
°  Echocardiogram Echocardiogram Transesophageal has been performed.  Fidel Levy 01/12/2022, 12:47 PM

## 2022-01-14 ENCOUNTER — Encounter (HOSPITAL_COMMUNITY): Payer: Self-pay | Admitting: Cardiovascular Disease

## 2022-01-19 NOTE — Progress Notes (Signed)
Cardiology Office Note   Date:  01/21/2022   ID:  Patricia [name], DOB 02/21/1951, MRN [id number]  PCP:  Donita Brooks, MD  Cardiologist:   Rollene Rotunda, MD   Chief Complaint  Patient presents with   Shortness of Breath       History of Present Illness: Patricia [name] is a 71 y.o. 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  She has had atrial flutter ablated.   She had an echo last week ordered by Dr. Tanya Nones.  She had moderate to severe AI.  On New Year's Day and she awoke and noticed that she had lower extremity swelling.  She is actually since that time had started sleeping on 2 or 3 pillows.  She did not have any chest discomfort, neck or arm discomfort.  She has not had any cough fevers or chills.  She has not noticed any palpitations, presyncope or syncope.  She was treated with Lasix.  This did improve her lower extremity swelling.  She did have a mildly elevated BNP.  Her kidney function was normal.  Echo demonstrated aortic valve regurgitation is moderate to severe.  The EF was 55 to 60%.  There was a Medtronic valve in the mitral position and he appeared to have normal function.  There is mild to moderate mitral regurgitation.  There is a question of flow reversal in the descending aorta.  She had TEE which also suggested severe AI with flow reversal.    She returns to day to discuss this.  She is actually felt well since that time.  She is not having the dyspnea that she was having.  She is not having any PND or orthopnea.  She is not having any palpitations, presyncope or syncope.  She has had no weight gain or edema.   Past Medical History:  Diagnosis Date   Atrial flutter (HCC)    Ablated 1998 Dr. Graciela Husbands   Basal cell carcinoma 06/05/2019   R low back, [facility] 07/16/19   BCC (basal cell carcinoma) 07/21/2021   right mid back, [place] 09/15/2021   Bleeding gastric ulcer    back in 2000   Breast cancer  (HCC)    COPD (chronic obstructive pulmonary disease) (HCC)    from radiation   GERD (gastroesophageal reflux disease)    History of basal cell carcinoma (BCC) 03/22/2021   right neck and right medial ear, Moh's   Hodgkin's lymphoma (HCC)    Treated with radiation and chemo   Mitral regurgitation    pvc   Osteoporosis    lumbar verterbral fracture, left ankle fracture, dexa 2015   Pancreatitis    Pneumonia    Prediabetes    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 09/13/2017   25 mm Medtronic Mosaic porcine stented bioprosthetic tissue valve   Ulcer     Past Surgical History:  Procedure Laterality Date   BREAST SURGERY     breast cancer since 2005   left mastectomy   BUBBLE STUDY  01/12/2022   Procedure: BUBBLE STUDY;  Surgeon: Sande Rives, MD;  Location: [facility] ENDOSCOPY;  Service: Cardiovascular;;   CARDIAC ELECTROPHYSIOLOGY MAPPING AND ABLATION  1999   CARDIAC VALVE REPLACEMENT N/A    [employer] 03/19/2021   CATARACT EXTRACTION, BILATERAL     COLONOSCOPY     IR RADIOLOGY PERIPHERAL GUIDED IV START  08/03/2017   IR US GUIDE VASC  ACCESS RIGHT  08/03/2017   LAPAROTOMY     MASTECTOMY  2005   Left-axillary   MITRAL VALVE REPAIR Right 09/13/2017   Procedure: MINIMALLY INVASIVE MITRAL VALVE REPLACEMENT (MVR) with Medtronic Mosaic Porcine Heart Valve size 38mm;  Surgeon: Rexene Alberts, MD;  Location: [place];  Service: Open Heart Surgery;  Laterality: Right;   ORIF ANKLE FRACTURE Left 03/21/2014   Procedure: LEFT ANKLE FRACTURE OPEN TREATMENT BILMALLEOLAR INCLUDES INTERNAL FIXATION ;  Surgeon: Renette Butters, MD;  Location: [place];  Service: Orthopedics;  Laterality: Left;   PACEMAKER IMPLANT N/A 09/19/2017   Medtronic Azure XT MRI conditional dual-chamber pacemaker for symptomatic complete heart block  by Dr Rayann Heman   RIGHT/LEFT HEART CATH AND CORONARY ANGIOGRAPHY N/A 06/27/2017   Procedure: Right/Left Heart Cath and Coronary  Angiography;  Surgeon: Larey [name], MD;  Location: [place] CV LAB;  Service: Cardiovascular;  Laterality: N/A;   SPLENECTOMY  1974   hodgkins disease   TEE WITHOUT CARDIOVERSION  06/12/2012   Procedure: TRANSESOPHAGEAL ECHOCARDIOGRAM (TEE);  Surgeon: Larey [name], MD;  Location: [place];  Service: Cardiovascular;  Laterality: N/A;   TEE WITHOUT CARDIOVERSION N/A 06/29/2017   Procedure: TRANSESOPHAGEAL ECHOCARDIOGRAM (TEE);  Surgeon: Jerline Pain, MD;  Location[facility] ENDOSCOPY;  Service: Cardiovascular;  Laterality: N/A;   TEE WITHOUT CARDIOVERSION N/A 09/13/2017   Procedure: TRANSESOPHAGEAL ECHOCARDIOGRAM (TEE);  Surgeon: Rexene Alberts, MD;  Location: [place];  Service: Open Heart Surgery;  Laterality: N/A;   TEE WITHOUT CARDIOVERSION N/A 01/12/2022   Procedure: TRANSESOPHAGEAL ECHOCARDIOGRAM (TEE);  Surgeon: Geralynn Rile, MD;  Location: [place];  Service: Cardiovascular;  Laterality: N/A;   TONSILLECTOMY AND ADENOIDECTOMY       Current Outpatient Medications  Medication Sig Dispense Refill   acetaminophen (TYLENOL) 325 MG tablet Take 2 tablets (650 mg total) by mouth 2 (two) times daily as needed for moderate pain or headache.     alendronate (FOSAMAX) 70 MG tablet Take 1 tablet (70 mg total) by mouth once a week. (Patient taking differently: Take 70 mg by mouth once a week. Sunday) 12 tablet 3   atorvastatin (LIPITOR) 10 MG tablet TAKE 1 TABLET BY MOUTH ONCE A DAY (NEED APPT FOR REFILLS) 90 tablet 3   Blood Glucose Monitoring Suppl (ACCU-CHEK GUIDE) w/Device KIT 1 Units by Does not apply route daily. 1 kit 0   Calcium Carbonate (CALCIUM 600 PO) Take 600 mg by mouth 2 (two) times daily. Lunch and Dinner     chlorthalidone (HYGROTON) 25 MG tablet TAKE 1 TABLET BY MOUTH ONCE A DAY 90 tablet 2   CREON 36000-114000 units CPEP capsule Take 42,595-63,875 Units by mouth See admin instructions. Take 72,000 in the morning and 36,000 for lunch and dinner     esomeprazole  (NEXIUM) 40 MG capsule Take 40 mg by mouth daily before breakfast.   12   fluticasone (FLONASE) 50 MCG/ACT nasal spray Place 2 sprays into both nostrils daily. (Patient taking differently: Place 2 sprays into both nostrils daily as needed for rhinitis.) 16 g 6   furosemide (LASIX) 40 MG tablet Take 2 tablets (80 mg total) by mouth daily. (Patient taking differently: Take 40 mg by mouth daily. If weight is over 2 lbs take and additional 40 mg) 180 tablet 2   glucose blood (ACCU-CHEK GUIDE) test strip USE TO TEST BLOOD SUGARS EVERY MORNING 100 each 3   Lancets (ACCU-CHEK SOFT TOUCH) lancets Check BS QAM 100 each 3   Lancets  Misc. (ACCU-CHEK SOFTCLIX LANCET DEV) KIT Check BS QAM 1 kit 0   levothyroxine (SYNTHROID) 50 MCG tablet TAKE 1 TABLET BY MOUTH ONCE A DAY 90 tablet 3   metoprolol tartrate (LOPRESSOR) 100 MG tablet Take 1 tablet (100 mg total) by mouth 2 (two) times daily. 180 tablet 3   Multiple Vitamin (MULTIVITAMIN WITH MINERALS) TABS tablet Take 1 tablet by mouth daily with lunch. Centrum Silver.     potassium chloride SA (KLOR-CON M) 20 MEQ tablet TAKE 1 TABLET BY MOUTH ONCE A DAY (Patient taking differently: 20 mEq daily. Take additional 20 mg tablet with take an additional fursomide) 90 tablet 1   No current facility-administered medications for this visit.    Allergies:   Azithromycin, Penicillins, Sulfa antibiotics, and Cheese    ROS:  Please see the history of present illness.   Otherwise, review of systems are positive for none.   All other systems are reviewed and negative.    PHYSICAL EXAM: VS:  BP (!) 142/64    Pulse 96    Ht $R'5\' 6"'Fc$  (1.676 m)    Wt 116 lb 9.6 oz (52.9 kg)    SpO2 99%    BMI 18.82 kg/m  , BMI Body mass index is 18.82 kg/m. GENERAL:  Well appearing NECK:  No jugular venous distention, waveform within normal limits, carotid upstroke brisk and symmetric, no bruits, no thyromegaly LUNGS:  Clear to auscultation bilaterally CHEST:  Unremarkable HEART:  PMI not  displaced or sustained,S1 and S2 within normal limits, no S3, no S4, no clicks, no rubs, third left intercostal space systolic murmur, no diastolic murmurs ABD:  Flat, positive bowel sounds normal in frequency in pitch, no bruits, no rebound, no guarding, no midline pulsatile mass, no hepatomegaly, no splenomegaly EXT:  2 plus pulses throughout, no edema, no cyanosis no clubbing   EKG:  EKG is not ordered today.    TEE 1. There is severe aortic regurgitation due to incomplete leaflet  coaptation. The leaflets appear thickened and retracted. There is  restricted movement in systole, but no significant stenosis. The  regurgitation appears to be central but also into the  commissure between the NCC/LCC. 2D PISA radius 0.7 cm, 2D VC 0.5 cm, 2D  ERO 0.26 cm2, R vol 37 cc, RF 61%. PHT ~242 msec. There is holodiastolic  flow reversal in the descending aorta consistent with severe aortic  regurgitation. The aortic valve is  tricuspid. Aortic valve regurgitation is severe. No aortic stenosis is  present.   2. Left ventricular ejection fraction, by estimation, is 55 to 60%. The  left ventricle has normal function.   3. Right ventricular systolic function is normal. The right ventricular  size is normal.   4. No left atrial/left atrial appendage thrombus was detected. The LAA  emptying velocity was 43 cm/s.   5. 62mm Medtronic Mosaic prosthetic valve in mitral position (09/13/2017).  MG 3 mmHG @ 84 bpm, E max 1.8 m/s, EOA 1.52 cm2. No evidence of  regurgitation or paravalvular leak. The mitral valve has been  repaired/replaced. No evidence of mitral valve  regurgitation. There is a 25 mm Medtronic Mosaic bioprosthetic valve  present in the mitral position. Procedure Date: 09/13/2017.   6. There is mild (Grade II) layered plaque involving the descending  aorta.   Recent Labs: 12/15/2021: ALT 17 12/17/2021: Brain Natriuretic Peptide 229 01/07/2022: BUN 28; Creatinine, Ser 1.21; Hemoglobin 12.3;  Platelets 451; Potassium 4.4; Sodium 140    Lipid Panel  Component Value Date/Time   CHOL 136 03/25/2021 0811   TRIG 88 03/25/2021 0811   HDL 62 03/25/2021 0811   CHOLHDL 2.2 03/25/2021 0811   VLDL 31 (H) 07/07/2016 0847   LDLCALC 57 03/25/2021 0811      Wt Readings from Last 3 Encounters:  01/20/22 116 lb 9.6 oz (52.9 kg)  01/12/22 113 lb (51.3 kg)  12/28/21 117 lb (53.1 kg)      Other studies Reviewed: Additional studies/ records that were reviewed today include: TEE reviewed images personally Review of the above records demonstrates:  Please see elsewhere in the note.     ASSESSMENT AND PLAN:  MVR:   Mitral valve replacement appears to be stable.  No change in therapy.  She understands endocarditis prophylaxis.  AI:    This is severe.  We had a long discussion about this and she will need surgical repair.  I am going to set her up to see Dr. Cyndia Bent.  She will continue current therapy.  Thankfully she is not having any symptoms at this time.   PULMONARY HTN:    There was no evidence of elevated pulmonary pressures on recent TEE.   FLUTTER:   She has had no symptomatic recurrence of this.  She tolerates her anticoagulation.  No change in therapy.  HTN:   The blood pressure at target.  No change in therapy.  Signed, Minus Breeding, MD  01/21/2022 8:51 AM    [facility]

## 2022-01-20 ENCOUNTER — Ambulatory Visit: Payer: Medicare PPO | Admitting: Cardiology

## 2022-01-20 ENCOUNTER — Other Ambulatory Visit: Payer: Self-pay

## 2022-01-20 ENCOUNTER — Encounter: Payer: Self-pay | Admitting: Cardiology

## 2022-01-20 ENCOUNTER — Other Ambulatory Visit: Payer: Self-pay | Admitting: Family Medicine

## 2022-01-20 VITALS — BP 142/64 | HR 96 | Ht 66.0 in | Wt 116.6 lb

## 2022-01-20 DIAGNOSIS — I272 Pulmonary hypertension, unspecified: Secondary | ICD-10-CM

## 2022-01-20 DIAGNOSIS — I351 Nonrheumatic aortic (valve) insufficiency: Secondary | ICD-10-CM | POA: Diagnosis not present

## 2022-01-20 DIAGNOSIS — Z9889 Other specified postprocedural states: Secondary | ICD-10-CM | POA: Diagnosis not present

## 2022-01-20 DIAGNOSIS — I10 Essential (primary) hypertension: Secondary | ICD-10-CM

## 2022-01-20 NOTE — Patient Instructions (Signed)
°  Follow-Up: At [facility], you and your health needs are our priority.  As part of our continuing mission to provide you with exceptional heart care, we have created designated Provider Care Teams.  These Care Teams include your primary Cardiologist (physician) and Advanced Practice Providers (APPs -  Physician Assistants and Nurse Practitioners) who all work together to provide you with the care you need, when you need it.  We recommend signing up for the patient portal called "MyChart".  Sign up information is provided on this After Visit Summary.  MyChart is used to connect with patients for Virtual Visits (Telemedicine).  Patients are able to view lab/test results, encounter notes, upcoming appointments, etc.  Non-urgent messages can be sent to your provider as well.   To learn more about what you can do with MyChart, go to [email or web address].    Your next appointment:    AFTER YOU SEE DR Cyndia Bent

## 2022-01-21 ENCOUNTER — Encounter: Payer: Self-pay | Admitting: Cardiology

## 2022-01-27 ENCOUNTER — Encounter: Payer: Self-pay | Admitting: Dermatology

## 2022-01-27 ENCOUNTER — Other Ambulatory Visit: Payer: Self-pay

## 2022-01-27 ENCOUNTER — Ambulatory Visit: Payer: Medicare PPO | Admitting: Dermatology

## 2022-01-27 DIAGNOSIS — L578 Other skin changes due to chronic exposure to nonionizing radiation: Secondary | ICD-10-CM

## 2022-01-27 DIAGNOSIS — D229 Melanocytic nevi, unspecified: Secondary | ICD-10-CM

## 2022-01-27 DIAGNOSIS — L821 Other seborrheic keratosis: Secondary | ICD-10-CM | POA: Diagnosis not present

## 2022-01-27 DIAGNOSIS — C44519 Basal cell carcinoma of skin of other part of trunk: Secondary | ICD-10-CM

## 2022-01-27 DIAGNOSIS — Z85828 Personal history of other malignant neoplasm of skin: Secondary | ICD-10-CM | POA: Diagnosis not present

## 2022-01-27 DIAGNOSIS — D1800 Hemangioma unspecified site: Secondary | ICD-10-CM | POA: Diagnosis not present

## 2022-01-27 DIAGNOSIS — Z1283 Encounter for screening for malignant neoplasm of skin: Secondary | ICD-10-CM

## 2022-01-27 DIAGNOSIS — B078 Other viral warts: Secondary | ICD-10-CM

## 2022-01-27 DIAGNOSIS — L814 Other melanin hyperpigmentation: Secondary | ICD-10-CM

## 2022-01-27 DIAGNOSIS — D492 Neoplasm of unspecified behavior of bone, soft tissue, and skin: Secondary | ICD-10-CM

## 2022-01-27 NOTE — Patient Instructions (Addendum)
Cryotherapy Aftercare  Wash gently with soap and water everyday.   Apply Vaseline and Band-Aid daily until healed.   Prior to procedure, discussed risks of blister formation, small wound, skin dyspigmentation, or rare scar following cryotherapy. Recommend Vaseline ointment to treated areas while healing.   Wound Care Instructions  Cleanse wound gently with soap and water once a day then pat dry with clean gauze. Apply a thing coat of Petrolatum (petroleum jelly, "Vaseline") over the wound (unless you have an allergy to this). We recommend that you use a new, sterile tube of Vaseline. Do not pick or remove scabs. Do not remove the yellow or white "healing tissue" from the base of the wound.  Cover the wound with fresh, clean, nonstick gauze and secure with paper tape. You may use Band-Aids in place of gauze and tape if the would is small enough, but would recommend trimming much of the tape off as there is often too much. Sometimes Band-Aids can irritate the skin.  You should call the office for your biopsy report after 1 week if you have not already been contacted.  If you experience any problems, such as abnormal amounts of bleeding, swelling, significant bruising, significant pain, or evidence of infection, please call the office immediately.  FOR ADULT SURGERY PATIENTS: If you need something for pain relief you may take 1 extra strength Tylenol (acetaminophen) AND 2 Ibuprofen (200mg  each) together every 4 hours as needed for pain. (do not take these if you are allergic to them or if you have a reason you should not take them.) Typically, you may only need pain medication for 1 to 3 days.     Recommend taking Heliocare sun protection supplement daily in sunny weather for additional sun protection. For maximum protection on the sunniest days, you can take up to 2 capsules of regular Heliocare OR take 1 capsule of Heliocare Ultra. For prolonged exposure (such as a full day in the sun), you can  repeat your dose of the supplement 4 hours after your first dose. Heliocare can be purchased at [employer], at some [facility] or at [email or web address].    Recommend daily broad spectrum sunscreen SPF 30+ to sun-exposed areas, reapply every 2 hours as needed. Call for new or changing lesions.  Staying in the shade or wearing long sleeves, sun glasses (UVA+UVB protection) and wide brim hats (4-inch brim around the entire circumference of the hat) are also recommended for sun protection.   Seborrheic Keratosis  What causes seborrheic keratoses? Seborrheic keratoses are harmless, common skin growths that first appear during adult life.  As time goes by, more growths appear.  Some people may develop a large number of them.  Seborrheic keratoses appear on both covered and uncovered body parts.  They are not caused by sunlight.  The tendency to develop seborrheic keratoses can be inherited.  They vary in color from skin-colored to gray, brown, or even black.  They can be either smooth or have a rough, warty surface.   Seborrheic keratoses are superficial and look as if they were stuck on the skin.  Under the microscope this type of keratosis looks like layers upon layers of skin.  That is why at times the top layer may seem to fall off, but the rest of the growth remains and re-grows.    Treatment Seborrheic keratoses do not need to be treated, but can easily be removed in the office.  Seborrheic keratoses often cause symptoms when they rub on clothing or  jewelry.  Lesions can be in the way of shaving.  If they become inflamed, they can cause itching, soreness, or burning.  Removal of a seborrheic keratosis can be accomplished by freezing, burning, or surgery. If any spot bleeds, scabs, or grows rapidly, please return to have it checked, as these can be an indication of a skin cancer.   Melanoma ABCDEs  Melanoma is the most dangerous type of skin cancer, and is the leading cause of death from  skin disease.  You are more likely to develop melanoma if you: Have light-colored skin, light-colored eyes, or red or blond hair Spend a lot of time in the sun Tan regularly, either outdoors or in a tanning bed Have had blistering sunburns, especially during childhood Have a close family member who has had a melanoma Have atypical moles or large birthmarks  Early detection of melanoma is key since treatment is typically straightforward and cure rates are extremely high if we catch it early.   The first sign of melanoma is often a change in a mole or a new dark spot.  The ABCDE system is a way of remembering the signs of melanoma.  A for asymmetry:  The two halves do not match. B for border:  The edges of the growth are irregular. C for color:  A mixture of colors are present instead of an even brown color. D for diameter:  Melanomas are usually (but not always) greater than 27mm - the size of a pencil eraser. E for evolution:  The spot keeps changing in size, shape, and color.  Please check your skin once per month between visits. You can use a small mirror in front and a large mirror behind you to keep an eye on the back side or your body.   If you see any new or changing lesions before your next follow-up, please call to schedule a visit.  Please continue daily skin protection including broad spectrum sunscreen SPF 30+ to sun-exposed areas, reapplying every 2 hours as needed when you're outdoors.   Staying in the shade or wearing long sleeves, sun glasses (UVA+UVB protection) and wide brim hats (4-inch brim around the entire circumference of the hat) are also recommended for sun protection.    If You Need Anything After Your Visit  If you have any questions or concerns for your doctor, please call our main line at [phone] and press option 4 to reach your doctor's medical assistant. If no one answers, please leave a voicemail as directed and we will return your call as soon as  possible. Messages left after 4 pm will be answered the following business day.   You may also send [place] a message via [place]. We typically respond to MyChart messages within 1-2 business days.  For prescription refills, please ask your pharmacy to contact our office. Our fax number is [phone].  If you have an urgent issue when the clinic is closed that cannot wait until the next business day, you can page your doctor at the number below.    Please note that while we do our best to be available for urgent issues outside of office hours, we are not available 24/7.   If you have an urgent issue and are unable to reach [place], you may choose to seek medical care at your doctor's office, retail clinic, urgent care center, or emergency room.  If you have a medical emergency, please immediately call 911 or go to the emergency department.  Pager Numbers  -  Dr. Nehemiah Massed: [phone]  - Dr. Laurence Ferrari: [phone]  - Dr. Nicole Kindred: [phone]  In the event of inclement weather, please call our main line at [phone] for an update on the status of any delays or closures.  Dermatology Medication Tips: Please keep the boxes that topical medications come in in order to help keep track of the instructions about where and how to use these. Pharmacies typically print the medication instructions only on the boxes and not directly on the medication tubes.   If your medication is too expensive, please contact our office at [phone] option 4 or send [place] a message through [place].   We are unable to tell what your co-pay for medications will be in advance as this is different depending on your insurance coverage. However, we may be able to find a substitute medication at lower cost or fill out paperwork to get insurance to cover a needed medication.   If a prior authorization is required to get your medication covered by your insurance company, please allow [place] 1-2 business days to complete this  process.  Drug prices often vary depending on where the prescription is filled and some pharmacies may offer cheaper prices.  The website www.goodrx.com contains coupons for medications through different pharmacies. The prices here do not account for what the cost may be with help from insurance (it may be cheaper with your insurance), but the website can give you the price if you did not use any insurance.  - You can print the associated coupon and take it with your prescription to the pharmacy.  - You may also stop by our office during regular business hours and pick up a GoodRx coupon card.  - If you need your prescription sent electronically to a different pharmacy, notify our office through [facility] or by phone at [phone] option 4.     Si Usted Necesita Algo Despus de Su Visita  Tambin puede enviarnos un mensaje a travs de Pharmacist, community. Por lo general respondemos a los mensajes de MyChart en el transcurso de 1 a 2 das hbiles.  Para renovar recetas, por favor pida a su farmacia que se ponga en contacto con nuestra oficina. Harland Dingwall de fax es [phone].  Si tiene un asunto urgente cuando la clnica est cerrada y que no puede esperar hasta el siguiente da hbil, puede llamar/localizar a su doctor(a) al nmero que aparece a continuacin.   Por favor, tenga en cuenta que aunque hacemos todo lo posible para estar disponibles para asuntos urgentes fuera del horario de [place], no estamos disponibles las 24 horas del da, los 7 das de la [place].   Si tiene un problema urgente y no puede comunicarse con nosotros, puede optar por buscar atencin mdica  en el consultorio de su doctor(a), en una clnica privada, en un centro de atencin urgente o en una sala de emergencias.  Si tiene Engineering geologist, por favor llame inmediatamente al 911 o vaya a la sala de emergencias.  Nmeros de bper  - Dr. Nehemiah Massed: [phone]  - Dra. Moye: [phone]  - Dra.  Nicole Kindred: [phone]  En caso de inclemencias del [place], por favor llame a Johnsie Kindred principal al [phone] para una actualizacin sobre el [place] de cualquier retraso o cierre.  Consejos para la medicacin en dermatologa: Por favor, guarde las cajas en las que vienen los medicamentos de uso tpico para ayudarle a seguir las instrucciones sobre dnde y cmo usarlos. Alton instrucciones del medicamento  slo en las cajas y no directamente en los tubos del medicamento.   Si su medicamento es muy caro, por favor, pngase en contacto con Zigmund Daniel llamando al [phone] y presione la opcin 4 o envenos un mensaje a travs de Pharmacist, community.   No podemos decirle cul ser su copago por los medicamentos por adelantado ya que esto es diferente dependiendo de la cobertura de su seguro. Sin embargo, es posible que podamos encontrar un medicamento sustituto a Electrical engineer un formulario para que el seguro cubra el medicamento que se considera necesario.   Si se requiere una autorizacin previa para que su compaa de seguros [place] su medicamento, por favor permtanos de 1 a 2 das hbiles para completar este proceso.  Los precios de los medicamentos varan con frecuencia dependiendo del Environmental consultant de dnde se surte la receta y alguna farmacias pueden ofrecer precios ms baratos.  El sitio web www.goodrx.com tiene cupones para medicamentos de Airline pilot. Los precios aqu no tienen en cuenta lo que podra costar con la ayuda del seguro (puede ser ms barato con su seguro), pero el sitio web puede darle el precio si no utiliz Research scientist (physical sciences).  - Puede imprimir el cupn correspondiente y llevarlo con su receta a la farmacia.  - Tambin puede pasar por nuestra oficina durante el horario de atencin regular y Charity fundraiser una tarjeta de cupones de GoodRx.  - Si necesita que su receta se enve electrnicamente a una farmacia diferente, informe a nuestra oficina a  travs de MyChart de [place] o por telfono llamando al [phone] y presione la opcin 4.

## 2022-01-27 NOTE — Progress Notes (Signed)
Follow-Up Visit   Subjective  Patricia [name] is a 71 y.o. female who presents for the following: Annual Exam.  The patient presents for Total-Body Skin Exam (TBSE) for skin cancer screening and mole check.  The patient has spots, moles and lesions to be evaluated, some may be new or changing and the patient has concerns that these could be cancer.   The following portions of the chart were reviewed this encounter and updated as appropriate:  Tobacco   Allergies   Meds   Problems   Med Hx   Surg Hx   Fam Hx       Review of Systems: No other skin or systemic complaints except as noted in HPI or Assessment and Plan.   Objective  Well appearing patient in no apparent distress; mood and affect are within normal limits.  A full examination was performed including scalp, head, eyes, ears, nose, lips, neck, chest, axillae, abdomen, back, buttocks, bilateral upper extremities, bilateral lower extremities, hands, feet, fingers, toes, fingernails, and toenails. All findings within normal limits unless otherwise noted below.  B/L  Dorsal Hands x2 (2) Keratotic papules  Left Lower Abdomen 0.7cm pink and brown thin papule with irregular vessels       Assessment & Plan   Lentigines - Scattered tan macules - Due to sun exposure - Benign-appearing, observe - Recommend daily broad spectrum sunscreen SPF 30+ to sun-exposed areas, reapply every 2 hours as needed. - Call for any changes  Seborrheic Keratoses - Stuck-on, waxy, tan-brown papules and/or plaques  - Benign-appearing - Discussed benign etiology and prognosis. - Observe - Call for any changes  Melanocytic Nevi - Tan-brown and/or pink-flesh-colored symmetric macules and papules - Benign appearing on exam today - Observation - Call clinic for new or changing moles - Recommend daily use of broad spectrum spf 30+ sunscreen to sun-exposed areas.   Hemangiomas - Red papules - Discussed benign nature - Observe - Call for  any changes  Actinic Damage - Chronic condition, secondary to cumulative UV/sun exposure - diffuse scaly erythematous macules with underlying dyspigmentation - Recommend daily broad spectrum sunscreen SPF 30+ to sun-exposed areas, reapply every 2 hours as needed.  - Staying in the shade or wearing long sleeves, sun glasses (UVA+UVB protection) and wide brim hats (4-inch brim around the entire circumference of the hat) are also recommended for sun protection.  - Call for new or changing lesions.  History of Basal Cell Carcinoma of the Skin - No evidence of recurrence today at back, neck, right ear - Recommend regular full body skin exams - Recommend daily broad spectrum sunscreen SPF 30+ to sun-exposed areas, reapply every 2 hours as needed.  - Call if any new or changing lesions are noted between office visits   Skin cancer screening performed today.  Other viral warts (2) B/L  Dorsal Hands x2  Vs ISK  Discussed viral etiology and risk of spread.  Discussed multiple treatments may be required to clear warts.  Discussed possible post-treatment dyspigmentation and risk of recurrence.  Call office if not resolved in 4-6 weeks and can retreat  Prior to procedure, discussed risks of blister formation, small wound, skin dyspigmentation, or rare scar following cryotherapy. Recommend Vaseline ointment to treated areas while healing.   Destruction of lesion - B/L  Dorsal Hands x2  Destruction method: cryotherapy   Informed consent: discussed and consent obtained   Lesion destroyed using liquid nitrogen: Yes   Outcome: patient tolerated procedure well with no complications  Post-procedure details: wound care instructions given    Neoplasm of skin Left Lower Abdomen  Epidermal / dermal shaving  Lesion diameter (cm):  0.7 Informed consent: discussed and consent obtained   Patient was prepped and draped in usual sterile fashion: Area prepped with alcohol. Anesthesia: the lesion was  anesthetized in a standard fashion   Anesthetic:  1% lidocaine w/ epinephrine 1-100,000 buffered w/ 8.4% NaHCO3 Instrument used: flexible razor blade   Hemostasis achieved with: pressure, aluminum chloride and electrodesiccation   Outcome: patient tolerated procedure well   Post-procedure details: wound care instructions given   Post-procedure details comment:  Ointment and small bandage applied  Specimen 1 - Surgical pathology Differential Diagnosis: R/O atypia  Check Margins: No   Return in about 6 months (around 07/27/2022) for TBSE.  I, Emelia Salisbury, CMA, am acting as scribe for Forest Gleason, MD.  Documentation: I have reviewed the above documentation for accuracy and completeness, and I agree with the above.  Forest Gleason, MD

## 2022-02-02 ENCOUNTER — Telehealth: Payer: Self-pay

## 2022-02-02 NOTE — Telephone Encounter (Signed)
Patient advised bx results showed BCC, scheduled for EDC. Lurlean Horns., RMA

## 2022-02-07 ENCOUNTER — Ambulatory Visit: Payer: Medicare PPO | Admitting: Cardiology

## 2022-02-07 ENCOUNTER — Encounter: Payer: Self-pay | Admitting: Dermatology

## 2022-02-17 DIAGNOSIS — M25571 Pain in right ankle and joints of right foot: Secondary | ICD-10-CM | POA: Diagnosis not present

## 2022-02-21 ENCOUNTER — Telehealth: Payer: Self-pay | Admitting: Dermatology

## 2022-02-21 DIAGNOSIS — M25571 Pain in right ankle and joints of right foot: Secondary | ICD-10-CM | POA: Diagnosis not present

## 2022-02-21 NOTE — Telephone Encounter (Signed)
I spoke with Dr. Dennard Schaumann who was ok with [place] starting niacinamide for Patricia [name]. ? ?Please call patient and recommend Niacinamide or Nicotinamide '500mg'$  twice per day to lower risk of non-melanoma skin cancer by approximately 25%. This is usually available at [employer]. ? ?Thank you! ?

## 2022-02-23 DIAGNOSIS — H401132 Primary open-angle glaucoma, bilateral, moderate stage: Secondary | ICD-10-CM | POA: Diagnosis not present

## 2022-02-23 DIAGNOSIS — H52203 Unspecified astigmatism, bilateral: Secondary | ICD-10-CM | POA: Diagnosis not present

## 2022-02-23 DIAGNOSIS — Z961 Presence of intraocular lens: Secondary | ICD-10-CM | POA: Diagnosis not present

## 2022-02-24 ENCOUNTER — Ambulatory Visit: Payer: Medicare PPO | Admitting: Dermatology

## 2022-02-28 DIAGNOSIS — M25571 Pain in right ankle and joints of right foot: Secondary | ICD-10-CM | POA: Diagnosis not present

## 2022-03-08 ENCOUNTER — Encounter: Payer: Self-pay | Admitting: Internal Medicine

## 2022-03-08 ENCOUNTER — Ambulatory Visit: Payer: Medicare PPO | Admitting: Internal Medicine

## 2022-03-08 ENCOUNTER — Other Ambulatory Visit: Payer: Self-pay

## 2022-03-08 VITALS — BP 134/66 | HR 97 | Ht 66.0 in | Wt 119.8 lb

## 2022-03-08 DIAGNOSIS — I442 Atrioventricular block, complete: Secondary | ICD-10-CM

## 2022-03-08 DIAGNOSIS — I10 Essential (primary) hypertension: Secondary | ICD-10-CM | POA: Diagnosis not present

## 2022-03-08 DIAGNOSIS — Z950 Presence of cardiac pacemaker: Secondary | ICD-10-CM | POA: Diagnosis not present

## 2022-03-08 DIAGNOSIS — Z9889 Other specified postprocedural states: Secondary | ICD-10-CM

## 2022-03-08 NOTE — Patient Instructions (Signed)
Medication Instructions:  ?Your physician recommends that you continue on your current medications as directed. Please refer to the Current Medication list given to you today. ? ?Labwork: ?None ordered. ? ?Testing/Procedures: ?None ordered. ? ?Follow-Up: ?Your physician wants you to follow-up in: one year with Cristopher Peru, MD or one of the following Advanced Practice Providers on your designated Care Team:   ?Tommye Standard, PA-C ?Legrand Como "Jonni Sanger" Ellsworth, PA-C ? ?Remote monitoring is used to monitor your Pacemaker from home. This monitoring reduces the number of office visits required to check your device to one time per year. It allows [place] to keep an eye on the functioning of your device to ensure it is working properly. You are scheduled for a device check from home on 03/29/2022. You may send your transmission at any time that day. If you have a wireless device, the transmission will be sent automatically. After your physician reviews your transmission, you will receive a postcard with your next transmission date. ? ?Any Other Special Instructions Will Be Listed Below (If Applicable). ? ?If you need a refill on your cardiac medications before your next appointment, please call your pharmacy.  ? ? ? ? ?

## 2022-03-08 NOTE — Progress Notes (Signed)
? ? ? ? ?HPI ?Ms. Chmiel follows up today for ongoing evaluation and management of her PPM, NSVT, remote atrial flutter s/p ablation, and prior MV replacement. Since she was seen last by Dr. Greggory Brandy a year ago, she had done well until a month or two ago and was found to have worsening CHF symptoms and a 2D echo and then TEE demonstrated surgical AI. She is pending eval with Dr. Cyndia Bent. She has not had syncope.  ?Allergies  ?Allergen Reactions  ? Azithromycin Other (See Comments)  ?  pancreatitis  ? Penicillins Anaphylaxis, Hives, Swelling and Other (See Comments)  ?  PATIENT HAS HAD A PCN REACTION WITH IMMEDIATE RASH, FACIAL/TONGUE/THROAT SWELLING, SOB, OR LIGHTHEADEDNESS WITH HYPOTENSION:  #  #  #  YES  #  #  #   ?HAS PT DEVELOPED SEVERE RASH INVOLVING MUCUS MEMBRANES or SKIN NECROSIS: #  #  #  YES  #  #  #  ?Has patient had a PCN reaction that required hospitalization: No ?Has patient had a PCN reaction occurring within the last 10 years: No ?  ? Sulfa Antibiotics Hives and Rash  ? Cheese Nausea And Vomiting  ? ? ? ?Current Outpatient Medications  ?Medication Sig Dispense Refill  ? acetaminophen (TYLENOL) 325 MG tablet Take 2 tablets (650 mg total) by mouth 2 (two) times daily as needed for moderate pain or headache.    ? alendronate (FOSAMAX) 70 MG tablet Take 1 tablet (70 mg total) by mouth once a week. (Patient taking differently: Take 70 mg by mouth once a week. Sunday) 12 tablet 3  ? atorvastatin (LIPITOR) 10 MG tablet TAKE 1 TABLET BY MOUTH ONCE A DAY (NEED APPT FOR REFILLS) 90 tablet 3  ? Blood Glucose Monitoring Suppl (ACCU-CHEK GUIDE) w/Device KIT 1 Units by Does not apply route daily. 1 kit 0  ? Calcium Carbonate (CALCIUM 600 PO) Take 600 mg by mouth 2 (two) times daily. Lunch and Dinner    ? chlorthalidone (HYGROTON) 25 MG tablet TAKE 1 TABLET BY MOUTH ONCE A DAY 90 tablet 2  ? CREON 36000-114000 units CPEP capsule Take 37,048-88,916 Units by mouth See admin instructions. Take 72,000 in the morning and  36,000 for lunch and dinner    ? esomeprazole (NEXIUM) 40 MG capsule Take 40 mg by mouth daily before breakfast.   12  ? fluticasone (FLONASE) 50 MCG/ACT nasal spray Place 2 sprays into both nostrils daily. (Patient taking differently: Place 2 sprays into both nostrils daily as needed for rhinitis.) 16 g 6  ? furosemide (LASIX) 40 MG tablet Take 2 tablets (80 mg total) by mouth daily. (Patient taking differently: Take 40 mg by mouth daily. If weight is over 2 lbs take and additional 40 mg) 180 tablet 2  ? glucose blood (ACCU-CHEK GUIDE) test strip USE TO TEST BLOOD SUGARS EVERY MORNING 100 each 3  ? Lancets (ACCU-CHEK SOFT TOUCH) lancets Check BS QAM 100 each 3  ? Lancets Misc. (ACCU-CHEK SOFTCLIX LANCET DEV) KIT Check BS QAM 1 kit 0  ? levothyroxine (SYNTHROID) 50 MCG tablet TAKE 1 TABLET BY MOUTH ONCE A DAY 90 tablet 3  ? metoprolol tartrate (LOPRESSOR) 100 MG tablet Take 1 tablet (100 mg total) by mouth 2 (two) times daily. 180 tablet 3  ? Multiple Vitamin (MULTIVITAMIN WITH MINERALS) TABS tablet Take 1 tablet by mouth daily with lunch. Centrum Silver.    ? potassium chloride SA (KLOR-CON M) 20 MEQ tablet TAKE 1 TABLET BY MOUTH ONCE A DAY (  Patient taking differently: 20 mEq daily. Take additional 20 mg tablet with take an additional fursomide) 90 tablet 1  ? ?No current facility-administered medications for this visit.  ? ? ? ?Past Medical History:  ?Diagnosis Date  ? Atrial flutter ([place])   ? Ablated 1998 Dr. Caryl Comes  ? Basal cell carcinoma 06/05/2019  ? R low back, EDC 07/16/19  ? Basal cell carcinoma 04/15/2010  ? Right post shoulder  ? Basal cell carcinoma 04/15/2010  ? Mid back  ? Basal cell carcinoma (BCC) 09/29/2016  ? Left post shoulder. Superficial and nodular.  ? BCC (basal cell carcinoma) 07/21/2021  ? right mid back, [facility] 09/15/2021  ? BCC (basal cell carcinoma) 01/27/2022  ? left lower abdomen  ? Bleeding gastric ulcer   ? back in 2000  ? Breast cancer ([place])   ? COPD (chronic obstructive pulmonary  disease) ([place])   ? from radiation  ? GERD (gastroesophageal reflux disease)   ? History of basal cell carcinoma (BCC) 03/22/2021  ? right neck and right medial ear, Moh's  ? Hodgkin's lymphoma ([place])   ? Treated with radiation and chemo  ? Mitral regurgitation   ? pvc  ? Osteoporosis   ? lumbar verterbral fracture, left ankle fracture, dexa 2015  ? Pancreatitis   ? Pneumonia   ? Prediabetes   ? 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 25 mm Medtronic Mosaic porcine stented bioprosthetic tissue valve  ? 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 Left medial lower leg  ? Ulcer   ? ? ?ROS: ? ? All systems reviewed and negative except as noted in the HPI. ? ? ?Past Surgical History:  ?Procedure Laterality Date  ? BREAST SURGERY    ? breast cancer since 2005   left mastectomy  ? BUBBLE STUDY  01/12/2022  ? Procedure: BUBBLE STUDY;  Surgeon: Geralynn Rile, MD;  Location: [place];  Service: Cardiovascular;;  ? CARDIAC ELECTROPHYSIOLOGY [place]  ? CARDIAC VALVE REPLACEMENT N/A   ? [employer] 03/19/2021  ? CATARACT EXTRACTION, BILATERAL    ? COLONOSCOPY    ? IR RADIOLOGY PERIPHERAL GUIDED IV START  08/03/2017  ? IR US GUIDE VASC ACCESS RIGHT  08/03/2017  ? LAPAROTOMY    ? MASTECTOMY  2005  ? Left-axillary  ? MITRAL VALVE REPAIR Right 09/13/2017  ? Procedure: MINIMALLY INVASIVE MITRAL VALVE REPLACEMENT (MVR) with Medtronic Mosaic Porcine Heart Valve size 4mm;  Surgeon: Rexene Alberts, MD;  Location: [place];  Service: Open Heart Surgery;  Laterality: Right;  ? ORIF ANKLE FRACTURE Left 03/21/2014  ? Procedure: LEFT ANKLE FRACTURE OPEN TREATMENT BILMALLEOLAR INCLUDES INTERNAL FIXATION ;  Surgeon: Renette Butters, MD;  Location: [place];  Service: Orthopedics;  Laterality: Left;  ? PACEMAKER IMPLANT N/A 09/19/2017  ? Medtronic Azure XT MRI conditional dual-chamber pacemaker for symptomatic complete heart block  by Dr Rayann Heman  ? RIGHT/LEFT  HEART CATH AND CORONARY ANGIOGRAPHY N/A 06/27/2017  ? Procedure: Right/Left Heart Cath and Coronary Angiography;  Surgeon: Larey [name], MD;  Location: [place] CV LAB;  Service: Cardiovascular;  Laterality: N/A;  ? SPLENECTOMY  1974  ? hodgkins disease  ? TEE WITHOUT CARDIOVERSION  06/12/2012  ? Procedure: TRANSESOPHAGEAL ECHOCARDIOGRAM (TEE);  Surgeon: Larey [name], MD;  Location: [place];  Service: Cardiovascular;  Laterality: N/A;  ? TEE WITHOUT CARDIOVERSION N/A 06/29/2017  ? Procedure: TRANSESOPHAGEAL ECHOCARDIOGRAM (TEE);  Surgeon: Jerline Pain, MD;  [facility];  Service: Cardiovascular;  Laterality: N/A;  ? TEE WITHOUT CARDIOVERSION N/A 09/13/2017  ? Procedure: TRANSESOPHAGEAL ECHOCARDIOGRAM (TEE);  Surgeon: Rexene Alberts, MD;  Location: [place];  Service: Open Heart Surgery;  Laterality: N/A;  ? TEE WITHOUT CARDIOVERSION N/A 01/12/2022  ? Procedure: TRANSESOPHAGEAL ECHOCARDIOGRAM (TEE);  Surgeon: Geralynn Rile, MD;  Location: [place];  Service: Cardiovascular;  Laterality: N/A;  ? TONSILLECTOMY AND ADENOIDECTOMY    ? ? ? ?Family History  ?Problem Relation Age of Onset  ? Rheumatic fever Father   ?     Died suddenly age 65  ? Diabetes Father   ? Cancer Father   ?     colon  ? Heart disease Father   ?     rheumatic fever x 3  ? Arthritis Maternal Grandmother   ? Hypertension Maternal Grandmother   ? Diabetes Paternal Grandmother   ? Vision loss Paternal Grandmother   ?     glaucoma  ? ? ? ?Social History  ? ?Socioeconomic History  ? Marital status: Widowed  ?  Spouse name: Not on file  ? Number of children: Not on file  ? Years of education: Not on file  ? Highest education level: Master's degree (e.g., MA, MS, MEng, MEd, MSW, MBA)  ?Occupational History  ? Occupation: Pharmacist, hospital (retired)  ?Tobacco Use  ? Smoking status: Never  ? Smokeless tobacco: Never  ?Vaping Use  ? Vaping Use: Never used  ?Substance and Sexual Activity  ? Alcohol use: No  ? Drug use: No  ? Sexual  activity: Yes  ?Other Topics Concern  ? Not on file  ?Social History Narrative  ? Husband passed away from Cancer in 2017/08/22.  ? ?Social Determinants of Health  ? ?Financial Resource Strain: Low Risk   ? Difficulty of

## 2022-03-16 ENCOUNTER — Encounter: Payer: Self-pay | Admitting: *Deleted

## 2022-03-16 ENCOUNTER — Encounter: Payer: Self-pay | Admitting: Surgery

## 2022-03-16 ENCOUNTER — Other Ambulatory Visit: Payer: Self-pay | Admitting: Surgery

## 2022-03-16 ENCOUNTER — Other Ambulatory Visit: Payer: Self-pay | Admitting: *Deleted

## 2022-03-16 ENCOUNTER — Institutional Professional Consult (permissible substitution): Payer: Medicare PPO | Admitting: Surgery

## 2022-03-16 ENCOUNTER — Encounter (HOSPITAL_COMMUNITY): Payer: Self-pay

## 2022-03-16 VITALS — BP 148/54 | HR 90 | Resp 20 | Ht 66.0 in | Wt 112.0 lb

## 2022-03-16 DIAGNOSIS — I351 Nonrheumatic aortic (valve) insufficiency: Secondary | ICD-10-CM

## 2022-03-16 NOTE — Progress Notes (Signed)
? ?Cardiothoracic Surgery Consutation ? ?PCP is Pickard, Cammie Mcgee, MD ?Referring Provider is Minus Breeding, MD ? ?Chief Complaint  ?Patient presents with  ? Aortic Insuffiency  ?  Surgical consult, TEE 01/12/22. ECHO 12/22/21, HX of mini MVR with CHO  ? ? ?HPI: ? 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  She presented in 2018 with exertional shortness of breath and fatigue an echocardiogram at that time showed severe mitral stenosis and moderate to severe mitral regurgitation with moderate aortic insufficiency.  She underwent minimally invasive mitral valve replacement by Dr. Roxy Manns on 09/13/2017 using a 25 mm Medtronic Mosaic porcine valve.  It was felt that the etiology of her valve disease was radiation.  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  She had an uneventful postoperative course.  She has done well over the years but in November or December of last year she began developing exertional fatigue and shortness of breath again.  A 2D echocardiogram showed moderate to severe aortic insufficiency.  Left ventricular ejection fraction is 55 to 60%.  The mitral valve prosthesis was functioning normally.  She subsequently underwent TEE by Dr. Davina Poke on 01/12/2022.  This showed severe aortic insufficiency with incomplete leaflet coaptation and thickening and retraction of the leaflets there is no significant stenosis.  Left ventricular ejection fraction was 55 to 60%.  The mitral valve prosthesis was functioning normally with no evidence of regurgitation or paravalvular leak. ? ?She reports  continued exertional fatigue and shortness of breath.  She walks to her mailbox every day but has to stop multiple times.  If she is out shopping she has to stop frequently.  She has not had any significant lower extremity edema since being on diuretic.  She continues to have some orthopnea and sleeps on 3 pillows. ? ? ?Past Medical History:  ?Diagnosis Date  ? Atrial flutter ([place])   ? Ablated 1998 Dr. Caryl Comes  ? Basal cell carcinoma 06/05/2019  ? R low back, EDC 07/16/19  ? Basal cell carcinoma 04/15/2010  ? Right post shoulder  ? Basal cell carcinoma 04/15/2010  ? Mid back  ? Basal cell carcinoma (BCC) 09/29/2016  ? Left post shoulder. Superficial and nodular.  ? BCC (basal cell carcinoma) 07/21/2021  ? right mid back, [facility] 09/15/2021  ? BCC (basal cell carcinoma) 01/27/2022  ? left lower abdomen  ? Bleeding gastric ulcer   ? back in 2000  ? Breast cancer ([place])   ? COPD (chronic obstructive pulmonary disease) ([place])   ? from radiation  ? GERD (gastroesophageal reflux disease)   ? History of basal cell carcinoma (BCC) 03/22/2021  ? right neck and right medial ear, Moh's  ? Hodgkin's lymphoma ([place])   ? Treated with radiation and chemo  ? Mitral regurgitation   ? pvc  ? Osteoporosis   ? lumbar verterbral fracture, left ankle fracture, dexa 2015  ? Pancreatitis   ? Pneumonia   ? Prediabetes   ? 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 25 mm Medtronic Mosaic porcine stented bioprosthetic tissue valve  ? 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  Left medial lower leg  ? Ulcer   ? ? ?Past Surgical History:  ?Procedure Laterality Date  ? BREAST SURGERY    ? breast cancer since 2005   left mastectomy  ? BUBBLE STUDY  01/12/2022  ? Procedure: BUBBLE STUDY;  Surgeon: Geralynn Rile, MD;  Location: [place];  Service: Cardiovascular;;  ? CARDIAC ELECTROPHYSIOLOGY [place]  ? CARDIAC VALVE REPLACEMENT N/A   ? [employer]rees[employer] 03/19/2021  ? CATARACT EXTRACTION,  BILATERAL    ? COLONOSCOPY    ? IR RADIOLOGY PERIPHERAL GUIDED IV START  08/03/2017  ? IR US GUIDE VASC ACCESS RIGHT  08/03/2017  ? LAPAROTOMY    ? MASTECTOMY  2005  ? Left-axillary  ? MITRAL VALVE REPAIR Right 09/13/2017  ? Procedure: MINIMALLY INVASIVE MITRAL VALVE REPLACEMENT (MVR) with Medtronic Mosaic Porcine Heart Valve size 78m;  Surgeon: ORexene Alberts MD;  [place]  Service: Open Heart Surgery;  Laterality: Right;  ? ORIF ANKLE FRACTURE Left 03/21/2014  ? Procedure: LEFT ANKLE FRACTURE OPEN TREATMENT BILMALLEOLAR INCLUDES INTERNAL FIXATION ;  Surgeon: TRenette Butters MD;  [place]  Service: Orthopedics;  Laterality: Left;  ? PACEMAKER IMPLANT N/A 09/19/2017  ? Medtronic Azure XT MRI conditional dual-chamber pacemaker for symptomatic complete heart block  by Dr ARayann Heman ? RIGHT/LEFT HEART CATH AND CORONARY ANGIOGRAPHY N/A 06/27/2017  ? Procedure: Right/Left Heart Cath and Coronary Angiography;  Surgeon: MLarey [name] MD;  [facility];  Service: Cardiovascular;  Laterality: N/A;  ? SPLENECTOMY  1974  ? hodgkins disease  ? TEE WITHOUT CARDIOVERSION  06/12/2012  ? Procedure: TRANSESOPHAGEAL ECHOCARDIOGRAM (TEE);  Surgeon: DLarey [name] MD;  [place]  Service: Cardiovascular;  Laterality: N/A;  ? TEE WITHOUT CARDIOVERSION N/A 06/29/2017  ? Procedure: TRANSESOPHAGEAL ECHOCARDIOGRAM (TEE);  Surgeon: SJerline Pain MD;  [facility];  Service: Cardiovascular;  Laterality: N/A;  ? TEE WITHOUT CARDIOVERSION N/A 09/13/2017  ? Procedure: TRANSESOPHAGEAL ECHOCARDIOGRAM (TEE);  Surgeon: ORexene Alberts MD;  [place]  Service: Open Heart Surgery;  Laterality: N/A;  ? TEE WITHOUT CARDIOVERSION N/A 01/12/2022  ? Procedure: TRANSESOPHAGEAL ECHOCARDIOGRAM (TEE);  Surgeon: OGeralynn Rile MD;  [place]  Service: Cardiovascular;  Laterality: N/A;  ? TONSILLECTOMY AND ADENOIDECTOMY    ? ? ?Family History  ?Problem  Relation Age of Onset  ? Rheumatic fever Father   ?     Died suddenly age 72 ? Diabetes Father   ? Cancer Father   ?     colon  ? Heart disease Father   ?     rheumatic fever x 3  ? Arthritis Maternal Grandmother   ? Hypertension Maternal Grandmother   ? Diabetes Paternal Grandmother   ? Vision loss Paternal Grandmother   ?     glaucoma  ? ? ?Social History ?Social History  ? ?Tobacco Use  ? Smoking status: Never  ? Smokeless tobacco: Never  ?Vaping Use  ? Vaping Use: Never used  ?Substance Use Topics  ? Alcohol use: No  ? Drug use: No  ? ? ?Current Outpatient Medications  ?Medication Sig Dispense Refill  ? acetaminophen (TYLENOL) 325 MG tablet Take 2 tablets (650 mg total) by mouth 2 (two) times daily as needed for moderate pain or headache.    ? alendronate (FOSAMAX) 70 MG tablet Take 1 tablet (70 mg total) by mouth once a week. (Patient taking differently: Take 70 mg by  mouth once a week. Sunday) 12 tablet 3  ? atorvastatin (LIPITOR) 10 MG tablet TAKE 1 TABLET BY MOUTH ONCE A DAY (NEED APPT FOR REFILLS) 90 tablet 3  ? Blood Glucose Monitoring Suppl (ACCU-CHEK GUIDE) w/Device KIT 1 Units by Does not apply route daily. 1 kit 0  ? Calcium Carbonate (CALCIUM 600 PO) Take 600 mg by mouth 2 (two) times daily. Lunch and Dinner    ? chlorthalidone (HYGROTON) 25 MG tablet TAKE 1 TABLET BY MOUTH ONCE A DAY 90 tablet 2  ? CREON 36000-114000 units CPEP capsule Take 59,292-44,628 Units by mouth See admin instructions. Take 72,000 in the morning and 36,000 for lunch and dinner    ? esomeprazole (NEXIUM) 40 MG capsule Take 40 mg by mouth daily before breakfast.   12  ? fluticasone (FLONASE) 50 MCG/ACT nasal spray Place 2 sprays into both nostrils daily. (Patient taking differently: Place 2 sprays into both nostrils daily as needed for rhinitis.) 16 g 6  ? furosemide (LASIX) 40 MG tablet Take 2 tablets (80 mg total) by mouth daily. (Patient taking differently: Take 40 mg by mouth daily. If weight is over 2 lbs take and  additional 40 mg) 180 tablet 2  ? glucose blood (ACCU-CHEK GUIDE) test strip USE TO TEST BLOOD SUGARS EVERY MORNING 100 each 3  ? Lancets (ACCU-CHEK SOFT TOUCH) lancets Check BS QAM 100 each 3  ? Lancets Misc. (ACCU-

## 2022-03-17 ENCOUNTER — Other Ambulatory Visit: Payer: Self-pay | Admitting: Family Medicine

## 2022-03-22 ENCOUNTER — Ambulatory Visit (INDEPENDENT_AMBULATORY_CARE_PROVIDER_SITE_OTHER): Payer: Medicare PPO

## 2022-03-22 DIAGNOSIS — I442 Atrioventricular block, complete: Secondary | ICD-10-CM

## 2022-03-22 LAB — CUP PACEART REMOTE DEVICE CHECK
Battery Remaining Longevity: 71 mo
Battery Voltage: 2.97 V
Brady Statistic AP VP Percent: 0.01 %
Brady Statistic AP VS Percent: 0 %
Brady Statistic AS VP Percent: 99.97 %
Brady Statistic AS VS Percent: 0.02 %
Brady Statistic RA Percent Paced: 0.01 %
Brady Statistic RV Percent Paced: 99.98 %
Date Time Interrogation Session: 20230410234316
Implantable Lead Implant Date: 20181009
Implantable Lead Implant Date: 20181009
Implantable Lead Location: 753859
Implantable Lead Location: 753860
Implantable Lead Model: 5076
Implantable Lead Model: 5076
Implantable Pulse Generator Implant Date: 20181009
Lead Channel Impedance Value: 304 Ohm
Lead Channel Impedance Value: 361 Ohm
Lead Channel Impedance Value: 399 Ohm
Lead Channel Impedance Value: 418 Ohm
Lead Channel Pacing Threshold Amplitude: 0.625 V
Lead Channel Pacing Threshold Amplitude: 0.875 V
Lead Channel Pacing Threshold Pulse Width: 0.4 ms
Lead Channel Pacing Threshold Pulse Width: 0.4 ms
Lead Channel Sensing Intrinsic Amplitude: 2 mV
Lead Channel Sensing Intrinsic Amplitude: 2 mV
Lead Channel Sensing Intrinsic Amplitude: 3.625 mV
Lead Channel Setting Pacing Amplitude: 1.5 V
Lead Channel Setting Pacing Amplitude: 2.5 V
Lead Channel Setting Pacing Pulse Width: 0.4 ms
Lead Channel Setting Sensing Sensitivity: 2 mV

## 2022-03-23 DIAGNOSIS — I351 Nonrheumatic aortic (valve) insufficiency: Secondary | ICD-10-CM | POA: Diagnosis not present

## 2022-03-24 LAB — BASIC METABOLIC PANEL
BUN/Creatinine Ratio: 26 (calc) — ABNORMAL HIGH (ref 6–22)
BUN: 37 mg/dL — ABNORMAL HIGH (ref 7–25)
CO2: 35 mmol/L — ABNORMAL HIGH (ref 20–32)
Calcium: 10 mg/dL (ref 8.6–10.4)
Chloride: 94 mmol/L — ABNORMAL LOW (ref 98–110)
Creat: 1.44 mg/dL — ABNORMAL HIGH (ref 0.60–1.00)
Glucose, Bld: 113 mg/dL — ABNORMAL HIGH (ref 65–99)
Potassium: 4.5 mmol/L (ref 3.5–5.3)
Sodium: 139 mmol/L (ref 135–146)

## 2022-03-30 LAB — CUP PACEART INCLINIC DEVICE CHECK
Battery Remaining Longevity: 72 mo
Battery Voltage: 2.97 V
Brady Statistic AP VP Percent: 0.02 %
Brady Statistic AP VS Percent: 0 %
Brady Statistic AS VP Percent: 99.92 %
Brady Statistic AS VS Percent: 0.06 %
Brady Statistic RA Percent Paced: 0.02 %
Brady Statistic RV Percent Paced: 99.94 %
Date Time Interrogation Session: 20230328143500
Implantable Lead Implant Date: 20181009
Implantable Lead Implant Date: 20181009
Implantable Lead Location: 753859
Implantable Lead Location: 753860
Implantable Lead Model: 5076
Implantable Lead Model: 5076
Implantable Pulse Generator Implant Date: 20181009
Lead Channel Impedance Value: 266 Ohm
Lead Channel Impedance Value: 323 Ohm
Lead Channel Impedance Value: 380 Ohm
Lead Channel Impedance Value: 399 Ohm
Lead Channel Pacing Threshold Amplitude: 0.625 V
Lead Channel Pacing Threshold Amplitude: 0.75 V
Lead Channel Pacing Threshold Pulse Width: 0.4 ms
Lead Channel Pacing Threshold Pulse Width: 0.4 ms
Lead Channel Sensing Intrinsic Amplitude: 2 mV
Lead Channel Sensing Intrinsic Amplitude: 2.25 mV
Lead Channel Sensing Intrinsic Amplitude: 3.625 mV
Lead Channel Setting Pacing Amplitude: 1.5 V
Lead Channel Setting Pacing Amplitude: 2.5 V
Lead Channel Setting Pacing Pulse Width: 0.4 ms
Lead Channel Setting Sensing Sensitivity: 2 mV

## 2022-04-04 ENCOUNTER — Telehealth (HOSPITAL_COMMUNITY): Payer: Self-pay | Admitting: Emergency Medicine

## 2022-04-04 ENCOUNTER — Encounter: Payer: Self-pay | Admitting: Internal Medicine

## 2022-04-04 NOTE — Progress Notes (Signed)
PERIOPERATIVE PRESCRIPTION FOR IMPLANTED CARDIAC DEVICE PROGRAMMING ? ?Patient Information: ?Name:  Patricia [name]  ?DOB:  18-May-1951  ?MRN:  [id number]  ?  ?Planned Procedure:  Aortic Valve Replacement  ?Surgeon:  Dr. Gilford Raid  ?Date of Procedure:  04/07/22  ?Cautery will be used.  ?Position during surgery:  Supine  ? ?Please send documentation back to:  ?[facility] (Fax # [phone])   ?Device Information: ? ?Clinic EP Physician:  Cristopher Peru, MD  ? ?Device Type:  Pacemaker ?Manufacturer and Phone #:  Medtronic: [id number] ?Pacemaker Dependent?:  Yes.   ?Date of Last Device Check:  03/31/22 (Remote) Normal Device Function?:  Yes.   ? ?Electrophysiologist's Recommendations: ? ?Have magnet available. ?Provide continuous ECG monitoring when magnet is used or reprogramming is to be performed.  ?Procedure will likely interfere with device function.  Device should be programmed:  Asynchronous pacing during procedure and returned to normal programming after procedure ? ?Per Device Clinic Standing Orders, ?Simone Curia, RN  ?1:41 PM 04/04/2022  ?

## 2022-04-04 NOTE — Telephone Encounter (Signed)
Reaching out to patient to offer assistance regarding upcoming cardiac imaging study; pt verbalizes understanding of appt date/time, parking situation and where to check in, pre-test NPO status and medications ordered, and verified current allergies; name and call back number provided for further questions should they arise ?Marchia Bond RN Navigator Cardiac Imaging ?[facility] ?[phone] office ?[phone] cell ? ?'100mg'$  metoprolol tart ?Denies iv issues ? Holding lasix ?HYDRATE ?Arrival 900 ?

## 2022-04-04 NOTE — Pre-Procedure Instructions (Signed)
Surgical Instructions ? ? ? Your procedure is scheduled on Thursday, April 27th. ? Report to [facility] Main Entrance "A" at 5:30 A.M., then check in with the Admitting office. ? Call this number if you have problems the morning of surgery: ? [phone] ? ? If you have any questions prior to your surgery date call [phone]: Open Monday-Friday 8am-4pm ? ? ? Remember: ? Do not eat or drink after midnight the night before your surgery ?  ? Take these medicines the morning of surgery with A SIP OF WATER  ?atorvastatin (LIPITOR) ?esomeprazole (Bear)  ?levothyroxine (SYNTHROID)  ?metoprolol tartrate (LOPRESSOR)  ? ?As needed: ?acetaminophen (TYLENOL)  ?fluticasone (FLONASE)  ? ?As of today, STOP taking any Aspirin (unless otherwise instructed by your surgeon) Aleve, Naproxen, Ibuprofen, Motrin, Advil, Goody's, BC's, all herbal medications, fish oil, and all vitamins. ?         ?           ?Do NOT Smoke (Tobacco/Vaping) for 24 hours prior to your procedure. ? ?If you use a CPAP at night, you may bring your mask/headgear for your overnight stay. ?  ?Contacts, glasses, piercing's, hearing aid's, dentures or partials may not be worn into surgery, please bring cases for these belongings.  ?  ?For patients admitted to the hospital, discharge time will be determined by your treatment team. ?  ?Patients discharged the day of surgery will not be allowed to drive home, and someone needs to stay with them for 24 hours. ? ?SURGICAL WAITING ROOM VISITATION ?Patients having surgery or a procedure may have two support people in the waiting room. These visitors may be switched out with other visitors if needed. ?Children under the age of 70 must have an adult accompany them who is not the patient. ?If the patient needs to stay at the hospital during part of their recovery, the visitor guidelines for inpatient rooms apply. ? ?Please refer to the [facility] website for the visitor guidelines for Inpatients (after your surgery is  over and you are in a regular room).  ? ? ?Special instructions:   ?[place]- Preparing For Surgery ? ?Before surgery, you can play an important role. Because skin is not sterile, your skin needs to be as free of germs as possible. You can reduce the number of germs on your skin by washing with CHG (chlorahexidine gluconate) Soap before surgery.  CHG is an antiseptic cleaner which kills germs and bonds with the skin to continue killing germs even after washing.   ? ?Oral Hygiene is also important to reduce your risk of infection.  Remember - BRUSH YOUR TEETH THE MORNING OF SURGERY WITH YOUR REGULAR TOOTHPASTE ? ?Please do not use if you have an allergy to CHG or antibacterial soaps. If your skin becomes reddened/irritated stop using the CHG.  ?Do not shave (including legs and underarms) for at least 48 hours prior to first CHG shower. It is OK to shave your face. ? ?Please follow these instructions carefully. ?  ?Shower the NIGHT BEFORE SURGERY and the MORNING OF SURGERY ? ?If you chose to wash your hair, wash your hair first as usual with your normal shampoo. ? ?After you shampoo, rinse your hair and body thoroughly to remove the shampoo. ? ?Use CHG Soap as you would any other liquid soap. You can apply CHG directly to the skin and wash gently with a scrungie or a clean washcloth.  ? ?Apply the CHG Soap to your body ONLY FROM THE NECK DOWN.  Do not use on open wounds or open sores. Avoid contact with your eyes, ears, mouth and genitals (private parts). Wash Face and genitals (private parts)  with your normal soap.  ? ?Wash thoroughly, paying special attention to the area where your surgery will be performed. ? ?Thoroughly rinse your body with warm water from the neck down. ? ?DO NOT shower/wash with your normal soap after using and rinsing off the CHG Soap. ? ?Pat yourself dry with a CLEAN TOWEL. ? ?Wear CLEAN PAJAMAS to bed the night before surgery ? ?Place CLEAN SHEETS on your bed the night before your  surgery ? ?DO NOT SLEEP WITH PETS. ? ? ?Day of Surgery: ?Take a shower with CHG soap. ?Do not wear jewelry or makeup ?Do not wear lotions, powders, perfumes, or deodorant. ?Do not shave 48 hours prior to surgery.   ?Do not bring valuables to the hospital.  ?[facility] is not responsible for any belongings or valuables. ?Do not wear nail polish, gel polish, artificial nails, or any other type of covering on natural nails (fingers and toes) ?If you have artificial nails or gel coating that need to be removed by a nail salon, please have this removed prior to surgery. Artificial nails or gel coating may interfere with anesthesia's ability to adequately monitor your vital signs. ?Wear Clean/Comfortable clothing the morning of surgery ?Remember to brush your teeth WITH YOUR REGULAR TOOTHPASTE. ?  ?Please read over the following fact sheets that you were given. ? ? ? ?If you received a COVID test during your pre-op visit  it is requested that you wear a mask when out in public, stay away from anyone that may not be feeling well and notify your surgeon if you develop symptoms. If you have been in contact with anyone that has tested positive in the last 10 days please notify you surgeon. ? ?

## 2022-04-05 ENCOUNTER — Ambulatory Visit (HOSPITAL_COMMUNITY)
Admission: RE | Admit: 2022-04-05 | Discharge: 2022-04-05 | Disposition: A | Discharge status: Home / Self Care | Payer: Medicare PPO | Source: Ambulatory Visit | Attending: Surgery | Admitting: Surgery

## 2022-04-05 ENCOUNTER — Other Ambulatory Visit: Payer: Self-pay

## 2022-04-05 ENCOUNTER — Other Ambulatory Visit: Payer: Self-pay | Admitting: Surgery

## 2022-04-05 ENCOUNTER — Encounter (HOSPITAL_COMMUNITY): Payer: Self-pay

## 2022-04-05 ENCOUNTER — Ambulatory Visit (HOSPITAL_BASED_OUTPATIENT_CLINIC_OR_DEPARTMENT_OTHER)
Admission: RE | Admit: 2022-04-05 | Discharge: 2022-04-05 | Disposition: A | Discharge status: Home / Self Care | Payer: Medicare PPO | Source: Ambulatory Visit | Attending: Surgery | Admitting: Surgery

## 2022-04-05 ENCOUNTER — Encounter (HOSPITAL_COMMUNITY)
Admission: RE | Admit: 2022-04-05 | Discharge: 2022-04-05 | Disposition: A | Discharge status: Home / Self Care | Payer: Medicare PPO | Source: Ambulatory Visit | Attending: Surgery | Admitting: Surgery

## 2022-04-05 VITALS — BP 125/48 | HR 71 | Temp 98.3°F | Resp 17 | Ht 66.0 in | Wt 121.1 lb

## 2022-04-05 DIAGNOSIS — Z882 Allergy status to sulfonamides status: Secondary | ICD-10-CM | POA: Diagnosis not present

## 2022-04-05 DIAGNOSIS — I10 Essential (primary) hypertension: Secondary | ICD-10-CM | POA: Diagnosis not present

## 2022-04-05 DIAGNOSIS — D689 Coagulation defect, unspecified: Secondary | ICD-10-CM | POA: Diagnosis not present

## 2022-04-05 DIAGNOSIS — R000 Tachycardia, unspecified: Secondary | ICD-10-CM | POA: Diagnosis not present

## 2022-04-05 DIAGNOSIS — Z952 Presence of prosthetic heart valve: Secondary | ICD-10-CM | POA: Insufficient documentation

## 2022-04-05 DIAGNOSIS — I272 Pulmonary hypertension, unspecified: Secondary | ICD-10-CM | POA: Diagnosis present

## 2022-04-05 DIAGNOSIS — K279 Peptic ulcer, site unspecified, unspecified as acute or chronic, without hemorrhage or perforation: Secondary | ICD-10-CM | POA: Diagnosis not present

## 2022-04-05 DIAGNOSIS — N289 Disorder of kidney and ureter, unspecified: Secondary | ICD-10-CM | POA: Diagnosis not present

## 2022-04-05 DIAGNOSIS — J939 Pneumothorax, unspecified: Secondary | ICD-10-CM | POA: Diagnosis not present

## 2022-04-05 DIAGNOSIS — I4892 Unspecified atrial flutter: Secondary | ICD-10-CM | POA: Diagnosis present

## 2022-04-05 DIAGNOSIS — I2510 Atherosclerotic heart disease of native coronary artery without angina pectoris: Secondary | ICD-10-CM | POA: Diagnosis not present

## 2022-04-05 DIAGNOSIS — K219 Gastro-esophageal reflux disease without esophagitis: Secondary | ICD-10-CM | POA: Diagnosis present

## 2022-04-05 DIAGNOSIS — I358 Other nonrheumatic aortic valve disorders: Secondary | ICD-10-CM | POA: Diagnosis not present

## 2022-04-05 DIAGNOSIS — J9811 Atelectasis: Secondary | ICD-10-CM | POA: Insufficient documentation

## 2022-04-05 DIAGNOSIS — I110 Hypertensive heart disease with heart failure: Secondary | ICD-10-CM | POA: Diagnosis present

## 2022-04-05 DIAGNOSIS — J449 Chronic obstructive pulmonary disease, unspecified: Secondary | ICD-10-CM | POA: Diagnosis present

## 2022-04-05 DIAGNOSIS — I351 Nonrheumatic aortic (valve) insufficiency: Secondary | ICD-10-CM

## 2022-04-05 DIAGNOSIS — Z8679 Personal history of other diseases of the circulatory system: Secondary | ICD-10-CM | POA: Insufficient documentation

## 2022-04-05 DIAGNOSIS — Z950 Presence of cardiac pacemaker: Secondary | ICD-10-CM | POA: Diagnosis not present

## 2022-04-05 DIAGNOSIS — J811 Chronic pulmonary edema: Secondary | ICD-10-CM | POA: Diagnosis not present

## 2022-04-05 DIAGNOSIS — E8770 Fluid overload, unspecified: Secondary | ICD-10-CM | POA: Diagnosis present

## 2022-04-05 DIAGNOSIS — Z20822 Contact with and (suspected) exposure to covid-19: Secondary | ICD-10-CM | POA: Diagnosis present

## 2022-04-05 DIAGNOSIS — S2243XA Multiple fractures of ribs, bilateral, initial encounter for closed fracture: Secondary | ICD-10-CM | POA: Diagnosis not present

## 2022-04-05 DIAGNOSIS — Z881 Allergy status to other antibiotic agents status: Secondary | ICD-10-CM | POA: Diagnosis not present

## 2022-04-05 DIAGNOSIS — J90 Pleural effusion, not elsewhere classified: Secondary | ICD-10-CM | POA: Diagnosis not present

## 2022-04-05 DIAGNOSIS — Z953 Presence of xenogenic heart valve: Secondary | ICD-10-CM | POA: Diagnosis not present

## 2022-04-05 DIAGNOSIS — I082 Rheumatic disorders of both aortic and tricuspid valves: Secondary | ICD-10-CM | POA: Diagnosis not present

## 2022-04-05 DIAGNOSIS — Z923 Personal history of irradiation: Secondary | ICD-10-CM | POA: Diagnosis not present

## 2022-04-05 DIAGNOSIS — Z880 Allergy status to penicillin: Secondary | ICD-10-CM | POA: Diagnosis not present

## 2022-04-05 DIAGNOSIS — Z91018 Allergy to other foods: Secondary | ICD-10-CM | POA: Diagnosis not present

## 2022-04-05 DIAGNOSIS — Z8571 Personal history of Hodgkin lymphoma: Secondary | ICD-10-CM | POA: Diagnosis not present

## 2022-04-05 DIAGNOSIS — I442 Atrioventricular block, complete: Secondary | ICD-10-CM | POA: Diagnosis present

## 2022-04-05 DIAGNOSIS — E871 Hypo-osmolality and hyponatremia: Secondary | ICD-10-CM | POA: Diagnosis present

## 2022-04-05 DIAGNOSIS — M810 Age-related osteoporosis without current pathological fracture: Secondary | ICD-10-CM | POA: Diagnosis present

## 2022-04-05 DIAGNOSIS — Z886 Allergy status to analgesic agent status: Secondary | ICD-10-CM | POA: Diagnosis not present

## 2022-04-05 DIAGNOSIS — Z01818 Encounter for other preprocedural examination: Secondary | ICD-10-CM | POA: Diagnosis not present

## 2022-04-05 DIAGNOSIS — E119 Type 2 diabetes mellitus without complications: Secondary | ICD-10-CM | POA: Diagnosis present

## 2022-04-05 DIAGNOSIS — Z853 Personal history of malignant neoplasm of breast: Secondary | ICD-10-CM | POA: Diagnosis not present

## 2022-04-05 DIAGNOSIS — I5032 Chronic diastolic (congestive) heart failure: Secondary | ICD-10-CM | POA: Diagnosis present

## 2022-04-05 HISTORY — DX: Presence of cardiac pacemaker: Z95.0

## 2022-04-05 LAB — CBC
HCT: 36.4 % (ref 36.0–46.0)
Hemoglobin: 12.7 g/dL (ref 12.0–15.0)
MCH: 29.6 pg (ref 26.0–34.0)
MCHC: 34.9 g/dL (ref 30.0–36.0)
MCV: 84.8 fL (ref 80.0–100.0)
Platelets: 380 10*3/uL (ref 150–400)
RBC: 4.29 MIL/uL (ref 3.87–5.11)
RDW: 15.5 % (ref 11.5–15.5)
WBC: 10.9 10*3/uL — ABNORMAL HIGH (ref 4.0–10.5)
nRBC: 0 % (ref 0.0–0.2)

## 2022-04-05 LAB — URINALYSIS, ROUTINE W REFLEX MICROSCOPIC
Bilirubin Urine: NEGATIVE
Glucose, UA: NEGATIVE mg/dL
Hgb urine dipstick: NEGATIVE
Ketones, ur: NEGATIVE mg/dL
Leukocytes,Ua: NEGATIVE
Nitrite: NEGATIVE
Protein, ur: NEGATIVE mg/dL
Specific Gravity, Urine: >1.046 — ABNORMAL HIGH (ref 1.005–1.030)
pH: 6 (ref 5.0–8.0)

## 2022-04-05 LAB — APTT: aPTT: 24 seconds (ref 24–36)

## 2022-04-05 LAB — BLOOD GAS, ARTERIAL
Acid-Base Excess: 5.8 mmol/L — ABNORMAL HIGH (ref 0.0–2.0)
Bicarbonate: 30.6 mmol/L — ABNORMAL HIGH (ref 20.0–28.0)
Drawn by: 60286
O2 Saturation: 98.5 %
Patient temperature: 37
pCO2 arterial: 44 mmHg (ref 32–48)
pH, Arterial: 7.45 (ref 7.35–7.45)
pO2, Arterial: 111 mmHg — ABNORMAL HIGH (ref 83–108)

## 2022-04-05 LAB — COMPREHENSIVE METABOLIC PANEL
ALT: 27 U/L (ref 0–44)
AST: 27 U/L (ref 15–41)
Albumin: 4.1 g/dL (ref 3.5–5.0)
Alkaline Phosphatase: 62 U/L (ref 38–126)
Anion gap: 13 (ref 5–15)
BUN: 34 mg/dL — ABNORMAL HIGH (ref 8–23)
CO2: 26 mmol/L (ref 22–32)
Calcium: 9.2 mg/dL (ref 8.9–10.3)
Chloride: 86 mmol/L — ABNORMAL LOW (ref 98–111)
Creatinine, Ser: 1.13 mg/dL — ABNORMAL HIGH (ref 0.44–1.00)
GFR, Estimated: 52 mL/min — ABNORMAL LOW (ref >60)
Glucose, Bld: 132 mg/dL — ABNORMAL HIGH (ref 70–99)
Potassium: 3.5 mmol/L (ref 3.5–5.1)
Sodium: 125 mmol/L — ABNORMAL LOW (ref 135–145)
Total Bilirubin: 0.9 mg/dL (ref 0.3–1.2)
Total Protein: 6.7 g/dL (ref 6.5–8.1)

## 2022-04-05 LAB — PROTIME-INR
INR: 1 (ref 0.8–1.2)
Prothrombin Time: 12.8 seconds (ref 11.4–15.2)

## 2022-04-05 LAB — HEMOGLOBIN A1C
Hgb A1c MFr Bld: 6.8 % — ABNORMAL HIGH (ref 4.8–5.6)
Mean Plasma Glucose: 148.46 mg/dL

## 2022-04-05 LAB — SARS CORONAVIRUS 2 (TAT 6-24 HRS): SARS Coronavirus 2: NEGATIVE

## 2022-04-05 LAB — SURGICAL PCR SCREEN
MRSA, PCR: NEGATIVE
Staphylococcus aureus: NEGATIVE

## 2022-04-05 MED ORDER — NITROGLYCERIN 0.4 MG SL SUBL
SUBLINGUAL_TABLET | SUBLINGUAL | Status: AC
  Filled 2022-04-05: qty 2

## 2022-04-05 MED ORDER — DILTIAZEM HCL 25 MG/5ML IV SOLN: 5.0000 mg | INTRAVENOUS | Status: DC | PRN

## 2022-04-05 MED ORDER — IOHEXOL 350 MG/ML SOLN
80.0000 mL | Freq: Once | INTRAVENOUS | Status: AC | PRN
Start: 2022-04-05 — End: 2022-04-05
  Administered 2022-04-05: 80 mL via INTRAVENOUS

## 2022-04-05 MED ORDER — DILTIAZEM HCL 25 MG/5ML IV SOLN
INTRAVENOUS | Status: AC
  Administered 2022-04-05: 10 mg via INTRAVENOUS
  Filled 2022-04-05: qty 5

## 2022-04-05 MED ORDER — METOPROLOL TARTRATE 5 MG/5ML IV SOLN
5.0000 mg | INTRAVENOUS | Status: DC | PRN
  Administered 2022-04-05: 10 mg via INTRAVENOUS

## 2022-04-05 MED ORDER — METOPROLOL TARTRATE 5 MG/5ML IV SOLN
INTRAVENOUS | Status: AC
  Filled 2022-04-05: qty 10

## 2022-04-05 MED ORDER — NITROGLYCERIN 0.4 MG SL SUBL
0.8000 mg | SUBLINGUAL_TABLET | Freq: Once | SUBLINGUAL | Status: AC
  Administered 2022-04-05: 0.8 mg via SUBLINGUAL

## 2022-04-05 NOTE — Progress Notes (Signed)
Notified Levonne Spiller, RN with [facility] regarding pt's NA level. Dr. Cyndia Bent will be made aware.  ? ?Jacqlyn Larsen, RN ? ?

## 2022-04-05 NOTE — Progress Notes (Signed)
PCP - Dr. Jenna Luo ?Cardiologist - Dr. Minus Breeding ?EP: Dr. Cristopher Peru ? ?PPM/ICD - Med[employer]onic PPM ?Device Orders - Electrophysiologist's Recommendations: ?  ?Have magnet available. ?Provide continuous ECG monitoring when magnet is used or reprogramming is to be performed.  ?Procedure will likely interfere with device function.  Device should be programmed:  Asynchronous pacing during procedure and returned to normal programming after procedure ?  ?Rep Notified - [place], rep with [employer] notified via email on 04/04/22. ? ?Chest x-ray - 04/05/22 ?EKG - 04/05/22 ?Stress Test - 2013 ?ECHO - 12/22/21 ?Cardiac Cath - 06/27/17 ? ?Sleep Study - denies ?CPAP - denies ? ?Blood Thinner Instructions: n/a ?Aspirin Instructions: n/a ? ?NPO ? ?COVID TEST- 04/05/22; pending.  Done in PAT ? ? ?Anesthesia review: Yes. ? ?Patient denies shortness of breath, fever, cough and chest pain at PAT appointment ? ? ?All instructions explained to the patient, with a verbal understanding of the material. Patient agrees to go over the instructions while at home for a better understanding. Patient also instructed to self quarantine after being tested for COVID-19. The opportunity to ask questions was provided. ? ? ?

## 2022-04-06 ENCOUNTER — Ambulatory Visit
Admission: RE | Admit: 2022-04-06 | Discharge: 2022-04-06 | Disposition: A | Discharge status: Home / Self Care | Payer: Medicare PPO | Source: Ambulatory Visit | Attending: Surgery | Admitting: Surgery

## 2022-04-06 DIAGNOSIS — S2243XA Multiple fractures of ribs, bilateral, initial encounter for closed fracture: Secondary | ICD-10-CM | POA: Diagnosis not present

## 2022-04-06 DIAGNOSIS — I351 Nonrheumatic aortic (valve) insufficiency: Secondary | ICD-10-CM

## 2022-04-06 DIAGNOSIS — Z01818 Encounter for other preprocedural examination: Secondary | ICD-10-CM | POA: Diagnosis not present

## 2022-04-06 DIAGNOSIS — I2510 Atherosclerotic heart disease of native coronary artery without angina pectoris: Secondary | ICD-10-CM | POA: Diagnosis not present

## 2022-04-06 DIAGNOSIS — J90 Pleural effusion, not elsewhere classified: Secondary | ICD-10-CM | POA: Diagnosis not present

## 2022-04-06 MED ORDER — HEPARIN 30,000 UNITS/1000 ML (OHS) CELLSAVER SOLUTION
Status: DC
  Filled 2022-04-06: qty 1000

## 2022-04-06 MED ORDER — VANCOMYCIN HCL IN DEXTROSE 1-5 GM/200ML-% IV SOLN
1000.0000 mg | INTRAVENOUS | Status: DC
  Filled 2022-04-06: qty 200

## 2022-04-06 MED ORDER — MANNITOL 20 % IV SOLN
INTRAVENOUS | Status: DC
  Filled 2022-04-06: qty 13

## 2022-04-06 MED ORDER — MAGNESIUM SULFATE 50 % IJ SOLN: 40.0000 meq | INTRAMUSCULAR | Status: DC

## 2022-04-06 MED ORDER — EPINEPHRINE HCL 5 MG/250ML IV SOLN IN NS
0.0000 ug/min | INTRAVENOUS | Status: DC
  Filled 2022-04-06: qty 250

## 2022-04-06 MED ORDER — NOREPINEPHRINE 4 MG/250ML-% IV SOLN
0.0000 ug/min | INTRAVENOUS | Status: DC
  Filled 2022-04-06: qty 250

## 2022-04-06 MED ORDER — TRANEXAMIC ACID 1000 MG/10ML IV SOLN
1.5000 mg/kg/h | INTRAVENOUS | Status: DC
  Filled 2022-04-06: qty 25

## 2022-04-06 MED ORDER — INSULIN REGULAR(HUMAN) IN NACL 100-0.9 UT/100ML-% IV SOLN
INTRAVENOUS | Status: DC
  Filled 2022-04-06: qty 100

## 2022-04-06 MED ORDER — TRANEXAMIC ACID (OHS) PUMP PRIME SOLUTION
2.0000 mg/kg | INTRAVENOUS | Status: DC
  Filled 2022-04-06: qty 1.1

## 2022-04-06 MED ORDER — PLASMA-LYTE A IV SOLN
INTRAVENOUS | Status: DC
  Filled 2022-04-06: qty 2.5

## 2022-04-06 MED ORDER — TRANEXAMIC ACID (OHS) BOLUS VIA INFUSION
15.0000 mg/kg | INTRAVENOUS | Status: DC
Start: 2022-04-07 — End: 2022-04-07
  Filled 2022-04-06: qty 824

## 2022-04-06 MED ORDER — LEVOFLOXACIN IN D5W 500 MG/100ML IV SOLN
500.0000 mg | INTRAVENOUS | Status: DC
  Filled 2022-04-06: qty 100

## 2022-04-06 MED ORDER — MILRINONE LACTATE IN DEXTROSE 20-5 MG/100ML-% IV SOLN
0.3000 ug/kg/min | INTRAVENOUS | Status: DC
Start: 2022-04-07 — End: 2022-04-07
  Filled 2022-04-06: qty 100

## 2022-04-06 MED ORDER — VANCOMYCIN HCL 1250 MG/250ML IV SOLN
1250.0000 mg | INTRAVENOUS | Status: DC
  Filled 2022-04-06: qty 250

## 2022-04-06 MED ORDER — POTASSIUM CHLORIDE 2 MEQ/ML IV SOLN
80.0000 meq | INTRAVENOUS | Status: DC
Start: 2022-04-07 — End: 2022-04-07
  Filled 2022-04-06: qty 40

## 2022-04-06 MED ORDER — VANCOMYCIN HCL IN DEXTROSE 1-5 GM/200ML-% IV SOLN
1000.0000 mg | INTRAVENOUS | Status: AC
  Administered 2022-04-07: 1000 mg via INTRAVENOUS
  Filled 2022-04-06 (×3): qty 200

## 2022-04-06 MED ORDER — NITROGLYCERIN IN D5W 200-5 MCG/ML-% IV SOLN
2.0000 ug/min | INTRAVENOUS | Status: DC
  Filled 2022-04-06: qty 250

## 2022-04-06 MED ORDER — IOPAMIDOL (ISOVUE-370) INJECTION 76%
75.0000 mL | Freq: Once | INTRAVENOUS | Status: AC | PRN
  Administered 2022-04-06: 75 mL via INTRAVENOUS

## 2022-04-06 MED ORDER — POTASSIUM CHLORIDE 2 MEQ/ML IV SOLN
80.0000 meq | INTRAVENOUS | Status: DC
  Filled 2022-04-06: qty 40

## 2022-04-06 MED ORDER — DEXMEDETOMIDINE HCL IN NACL 400 MCG/100ML IV SOLN
0.1000 ug/kg/h | INTRAVENOUS | Status: DC
  Filled 2022-04-06: qty 100

## 2022-04-06 MED ORDER — PHENYLEPHRINE HCL-NACL 20-0.9 MG/250ML-% IV SOLN
30.0000 ug/min | INTRAVENOUS | Status: DC
  Filled 2022-04-06: qty 250

## 2022-04-06 NOTE — H&P (Signed)
? ?   ?[address] ?      [place] [address] ?            [phone]   ? ?  ?Cardiothoracic Surgery Admission History and Physical ? ? ?PCP is Pickard, Cammie Mcgee, MD ?Referring Provider is Minus Breeding, MD ?  ?    ?Chief Complaint  ?Patient presents with  ? Aortic Insuffiency  ?      ?  ?  ?HPI: ?  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  She presented in 2018 with exertional shortness of breath and fatigue an echocardiogram at that time showed severe mitral stenosis and moderate to severe mitral regurgitation with moderate aortic insufficiency.  She underwent minimally invasive mitral valve replacement by Dr. Roxy Manns on 09/13/2017 using a 25 mm Medtronic Mosaic porcine valve.  It was felt that the etiology of her valve disease was radiation.  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  She had an uneventful postoperative course.  She has done well over the years but in November or December of last year she began developing exertional fatigue and shortness of breath again.  A 2D echocardiogram showed moderate to severe aortic insufficiency.  Left ventricular ejection fraction is 55 to 60%.  The mitral valve prosthesis was functioning normally.  She subsequently underwent TEE by Dr. Davina Poke on 01/12/2022.  This showed severe aortic insufficiency with incomplete leaflet coaptation and thickening and retraction of the leaflets there is no significant stenosis.  Left ventricular ejection fraction was 55 to 60%.  The mitral valve prosthesis was functioning normally with no  evidence of regurgitation or paravalvular leak. ?  ?She reports continued exertional fatigue and shortness of breath.  She walks to her mailbox every day but has to stop multiple times.  If she is out shopping she has to stop frequently.  She has not had any significant lower extremity edema since being on diuretic.  She continues to have some orthopnea and sleeps on 3 pillows. ?  ?  ?    ?Past Medical History:  ?Diagnosis Date  ? Atrial flutter ([place])    ?  Ablated 1998 Dr. Caryl Comes  ? Basal cell carcinoma 06/05/2019  ?  R low back, [facility] 07/16/19  ? Basal cell carcinoma 04/15/2010  ?  Right post shoulder  ? Basal cell carcinoma 04/15/2010  ?  Mid back  ? Basal cell carcinoma (BCC) 09/29/2016  ?  Left post shoulder. Superficial and nodular.  ? BCC (basal cell carcinoma) 07/21/2021  ?  right mid back, [facility] 09/15/2021  ? BCC (basal cell carcinoma) 01/27/2022  ?  left lower abdomen  ? Bleeding gastric ulcer    ?  back in 2000  ? Breast cancer ([place])    ? COPD (chronic obstructive pulmonary disease) ([place])    ?  from radiation  ? GERD (gastroesophageal reflux disease)    ? History of basal cell carcinoma (BCC) 03/22/2021  ?  right neck and right medial ear, Moh's  ? Hodgkin's lymphoma ([place])    ?  Treated with radiation and chemo  ? Mitral regurgitation    ?  pvc  ? Osteoporosis    ?  lumbar verterbral fracture, left ankle fracture, dexa 2015  ? Pancreatitis    ? Pneumonia    ? Prediabetes    ? 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  25 mm Medtronic Mosaic porcine stented bioprosthetic tissue valve  ? 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  Left medial lower leg  ? Ulcer    ?  ?  ?     ?Past Surgical History:  ?Procedure Laterality Date  ? BREAST SURGERY      ?  breast cancer since 2005   left mastectomy  ? BUBBLE STUDY   01/12/2022  ?  Procedure: BUBBLE STUDY;  Surgeon: Geralynn Rile, MD;  Location: [place];  Service: Cardiovascular;;  ? CARDIAC ELECTROPHYSIOLOGY  [place]  ? CARDIAC VALVE REPLACEMENT N/A    ?  Phreesia 03/19/2021  ? CATARACT EXTRACTION, BILATERAL      ? COLONOSCOPY      ? IR RADIOLOGY PERIPHERAL GUIDED IV START   08/03/2017  ? IR US GUIDE VASC ACCESS RIGHT   08/03/2017  ? LAPAROTOMY      ? MASTECTOMY   2005  ?  Left-axillary  ? MITRAL VALVE REPAIR Right 09/13/2017  ?  Procedure: MINIMALLY INVASIVE MITRAL VALVE REPLACEMENT (MVR) with Medtronic Mosaic Porcine Heart Valve size 23mm;  Surgeon: Rexene Alberts, MD;  Location: [place];  Service: Open Heart Surgery;  Laterality: Right;  ? ORIF ANKLE FRACTURE Left 03/21/2014  ?  Procedure: LEFT ANKLE FRACTURE OPEN TREATMENT BILMALLEOLAR INCLUDES INTERNAL FIXATION ;  Surgeon: Renette Butters, MD;  Location: [place];  Service: Orthopedics;  Laterality: Left;  ? PACEMAKER IMPLANT N/A 09/19/2017  ?  Medtronic Azure XT MRI conditional dual-chamber pacemaker for symptomatic complete heart block  by Dr Rayann Heman  ? RIGHT/LEFT HEART CATH AND CORONARY ANGIOGRAPHY N/A 06/27/2017  ?  Procedure: Right/Left Heart Cath and Coronary Angiography;  Surgeon: Larey [name], MD;  Location: [place] [facility];  Service: Cardiovascular;  Laterality: N/A;  ? SPLENECTOMY   1974  ?  hodgkins disease  ? TEE WITHOUT CARDIOVERSION   06/12/2012  ?  Procedure: TRANSESOPHAGEAL ECHOCARDIOGRAM (TEE);  Surgeon: Larey [name], MD;  Location: [place];  Service: Cardiovascular;  Laterality: N/A;  ? TEE WITHOUT CARDIOVERSION N/A 06/29/2017  ?  Procedure: TRANSESOPHAGEAL ECHOCARDIOGRAM (TEE);  Surgeon: Jerline Pain, MD;  Location: [facility] ENDOSCOPY;  Service: Cardiovascular;  Laterality: N/A;  ? TEE WITHOUT CARDIOVERSION N/A 09/13/2017  ?  Procedure: TRANSESOPHAGEAL ECHOCARDIOGRAM (TEE);  Surgeon: Rexene Alberts, MD;  Location: [facility];  Service: Open Heart Surgery;  Laterality: N/A;  ? TEE WITHOUT CARDIOVERSION N/A 01/12/2022  ?  Procedure: TRANSESOPHAGEAL ECHOCARDIOGRAM (TEE);  Surgeon: Geralynn Rile, MD;  Location:  [place];  Service: Cardiovascular;  Laterality: N/A;  ? TONSILLECTOMY AND ADENOIDECTOMY      ?  ?  ?     ?Family History  ?Problem Relation Age of Onset  ? Rheumatic fever Father    ?      Died suddenly age 38  ? Diabetes Father    ? Cancer Father    ?      colon  ? Heart disease Father    ?      rheumatic fever x 3  ? Arthritis Maternal Grandmother    ? Hypertension Maternal Grandmother    ? Diabetes Paternal Grandmother    ?  Vision loss Paternal Grandmother    ?      glaucoma  ?  ?  ?Social History ?Social History  ?  ?    ?Tobacco Use  ? Smoking status: Never  ? Smokeless tobacco: Never  ?Vaping Use  ? Vaping Use: Never used  ?Substance Use Topics  ? Alcohol use: No  ? Drug use: No  ?  ?  ?      ?Current Outpatient Medications  ?Medication Sig Dispense Refill  ? acetaminophen (TYLENOL) 325 MG tablet Take 2 tablets (650 mg total) by mouth 2 (two) times daily as needed for moderate pain or headache.      ? alendronate (FOSAMAX) 70 MG tablet Take 1 tablet (70 mg total) by mouth once a week. (Patient taking differently: Take 70 mg by mouth once a week. Sunday) 12 tablet 3  ? atorvastatin (LIPITOR) 10 MG tablet TAKE 1 TABLET BY MOUTH ONCE A DAY (NEED APPT FOR REFILLS) 90 tablet 3  ? Blood Glucose Monitoring Suppl (ACCU-CHEK GUIDE) w/Device KIT 1 Units by Does not apply route daily. 1 kit 0  ? Calcium Carbonate (CALCIUM 600 PO) Take 600 mg by mouth 2 (two) times daily. Lunch and Dinner      ? chlorthalidone (HYGROTON) 25 MG tablet TAKE 1 TABLET BY MOUTH ONCE A DAY 90 tablet 2  ? CREON 36000-114000 units CPEP capsule Take 41,638-45,364 Units by mouth See admin instructions. Take 72,000 in the morning and 36,000 for lunch and dinner      ? esomeprazole (NEXIUM) 40 MG capsule Take 40 mg by mouth daily before breakfast.    12  ? fluticasone (FLONASE) 50 MCG/ACT nasal spray Place 2 sprays into both nostrils daily. (Patient taking differently: Place 2 sprays into both nostrils daily as needed for rhinitis.) 16 g 6  ?  furosemide (LASIX) 40 MG tablet Take 2 tablets (80 mg total) by mouth daily. (Patient taking differently: Take 40 mg by mouth daily. If weight is over 2 lbs take and additional 40 mg) 180 tablet 2  ? gluco

## 2022-04-07 ENCOUNTER — Encounter (HOSPITAL_COMMUNITY)
Admission: RE | Disposition: A | Discharge status: Home Health | Payer: Self-pay | Source: Ambulatory Visit | Attending: Surgery

## 2022-04-07 ENCOUNTER — Inpatient Hospital Stay (HOSPITAL_COMMUNITY)
Admission: RE | Admit: 2022-04-07 | Discharge: 2022-04-14 | DRG: 220 | Disposition: A | Discharge status: Home Health | Payer: Medicare PPO | Source: Ambulatory Visit | Attending: Surgery | Admitting: Surgery

## 2022-04-07 ENCOUNTER — Inpatient Hospital Stay (HOSPITAL_COMMUNITY): Payer: Medicare PPO | Admitting: Certified Registered Nurse Anesthetist

## 2022-04-07 ENCOUNTER — Inpatient Hospital Stay (HOSPITAL_COMMUNITY): Payer: Medicare PPO

## 2022-04-07 ENCOUNTER — Other Ambulatory Visit: Payer: Self-pay

## 2022-04-07 DIAGNOSIS — Z953 Presence of xenogenic heart valve: Secondary | ICD-10-CM | POA: Diagnosis not present

## 2022-04-07 DIAGNOSIS — E871 Hypo-osmolality and hyponatremia: Secondary | ICD-10-CM | POA: Diagnosis present

## 2022-04-07 DIAGNOSIS — N289 Disorder of kidney and ureter, unspecified: Secondary | ICD-10-CM | POA: Diagnosis not present

## 2022-04-07 DIAGNOSIS — E119 Type 2 diabetes mellitus without complications: Secondary | ICD-10-CM | POA: Diagnosis present

## 2022-04-07 DIAGNOSIS — Z8249 Family history of ischemic heart disease and other diseases of the circulatory system: Secondary | ICD-10-CM

## 2022-04-07 DIAGNOSIS — Z886 Allergy status to analgesic agent status: Secondary | ICD-10-CM

## 2022-04-07 DIAGNOSIS — I351 Nonrheumatic aortic (valve) insufficiency: Secondary | ICD-10-CM | POA: Diagnosis present

## 2022-04-07 DIAGNOSIS — M810 Age-related osteoporosis without current pathological fracture: Secondary | ICD-10-CM | POA: Diagnosis present

## 2022-04-07 DIAGNOSIS — Z91018 Allergy to other foods: Secondary | ICD-10-CM | POA: Diagnosis not present

## 2022-04-07 DIAGNOSIS — I5032 Chronic diastolic (congestive) heart failure: Secondary | ICD-10-CM | POA: Diagnosis present

## 2022-04-07 DIAGNOSIS — Z833 Family history of diabetes mellitus: Secondary | ICD-10-CM

## 2022-04-07 DIAGNOSIS — I110 Hypertensive heart disease with heart failure: Secondary | ICD-10-CM | POA: Diagnosis present

## 2022-04-07 DIAGNOSIS — R000 Tachycardia, unspecified: Secondary | ICD-10-CM | POA: Diagnosis not present

## 2022-04-07 DIAGNOSIS — D689 Coagulation defect, unspecified: Secondary | ICD-10-CM | POA: Diagnosis not present

## 2022-04-07 DIAGNOSIS — Z809 Family history of malignant neoplasm, unspecified: Secondary | ICD-10-CM

## 2022-04-07 DIAGNOSIS — Z882 Allergy status to sulfonamides status: Secondary | ICD-10-CM

## 2022-04-07 DIAGNOSIS — Z853 Personal history of malignant neoplasm of breast: Secondary | ICD-10-CM | POA: Diagnosis not present

## 2022-04-07 DIAGNOSIS — J449 Chronic obstructive pulmonary disease, unspecified: Secondary | ICD-10-CM | POA: Diagnosis present

## 2022-04-07 DIAGNOSIS — R002 Palpitations: Secondary | ICD-10-CM | POA: Diagnosis not present

## 2022-04-07 DIAGNOSIS — Z923 Personal history of irradiation: Secondary | ICD-10-CM

## 2022-04-07 DIAGNOSIS — Z79899 Other long term (current) drug therapy: Secondary | ICD-10-CM

## 2022-04-07 DIAGNOSIS — I10 Essential (primary) hypertension: Secondary | ICD-10-CM | POA: Diagnosis not present

## 2022-04-07 DIAGNOSIS — I4892 Unspecified atrial flutter: Secondary | ICD-10-CM | POA: Diagnosis present

## 2022-04-07 DIAGNOSIS — Z8571 Personal history of Hodgkin lymphoma: Secondary | ICD-10-CM

## 2022-04-07 DIAGNOSIS — E8770 Fluid overload, unspecified: Secondary | ICD-10-CM | POA: Diagnosis present

## 2022-04-07 DIAGNOSIS — K219 Gastro-esophageal reflux disease without esophagitis: Secondary | ICD-10-CM | POA: Diagnosis present

## 2022-04-07 DIAGNOSIS — Z79890 Hormone replacement therapy (postmenopausal): Secondary | ICD-10-CM

## 2022-04-07 DIAGNOSIS — Z20822 Contact with and (suspected) exposure to covid-19: Secondary | ICD-10-CM | POA: Diagnosis present

## 2022-04-07 DIAGNOSIS — Z952 Presence of prosthetic heart valve: Principal | ICD-10-CM

## 2022-04-07 DIAGNOSIS — Z881 Allergy status to other antibiotic agents status: Secondary | ICD-10-CM

## 2022-04-07 DIAGNOSIS — I442 Atrioventricular block, complete: Principal | ICD-10-CM | POA: Diagnosis present

## 2022-04-07 DIAGNOSIS — Z7983 Long term (current) use of bisphosphonates: Secondary | ICD-10-CM

## 2022-04-07 DIAGNOSIS — Z9221 Personal history of antineoplastic chemotherapy: Secondary | ICD-10-CM

## 2022-04-07 DIAGNOSIS — I272 Pulmonary hypertension, unspecified: Secondary | ICD-10-CM | POA: Diagnosis present

## 2022-04-07 DIAGNOSIS — Z880 Allergy status to penicillin: Secondary | ICD-10-CM | POA: Diagnosis not present

## 2022-04-07 DIAGNOSIS — Z85828 Personal history of other malignant neoplasm of skin: Secondary | ICD-10-CM

## 2022-04-07 DIAGNOSIS — Z9012 Acquired absence of left breast and nipple: Secondary | ICD-10-CM

## 2022-04-07 DIAGNOSIS — Z86008 Personal history of in-situ neoplasm of other site: Secondary | ICD-10-CM

## 2022-04-07 DIAGNOSIS — Z950 Presence of cardiac pacemaker: Secondary | ICD-10-CM | POA: Diagnosis not present

## 2022-04-07 HISTORY — DX: Presence of prosthetic heart valve: Z95.2

## 2022-04-07 HISTORY — PX: TEE WITHOUT CARDIOVERSION: SHX5443

## 2022-04-07 HISTORY — PX: AORTIC VALVE REPLACEMENT: SHX41

## 2022-04-07 LAB — BASIC METABOLIC PANEL
Anion gap: 8 (ref 5–15)
BUN/Creatinine Ratio: 28 (calc) — ABNORMAL HIGH (ref 6–22)
BUN: 31 mg/dL — ABNORMAL HIGH (ref 7–25)
BUN: 21 mg/dL (ref 8–23)
CO2: 31 mmol/L (ref 20–32)
CO2: 25 mmol/L (ref 22–32)
Calcium: 9.8 mg/dL (ref 8.6–10.4)
Calcium: 7.7 mg/dL — ABNORMAL LOW (ref 8.9–10.3)
Chloride: 88 mmol/L — ABNORMAL LOW (ref 98–110)
Chloride: 99 mmol/L (ref 98–111)
Creat: 1.1 mg/dL — ABNORMAL HIGH (ref 0.60–1.00)
Creatinine, Ser: 1.18 mg/dL — ABNORMAL HIGH (ref 0.44–1.00)
GFR, Estimated: 49 mL/min — ABNORMAL LOW (ref >60)
Glucose, Bld: 112 mg/dL — ABNORMAL HIGH (ref 65–99)
Glucose, Bld: 112 mg/dL — ABNORMAL HIGH (ref 70–99)
Potassium: 4.5 mmol/L (ref 3.5–5.3)
Potassium: 3.4 mmol/L — ABNORMAL LOW (ref 3.5–5.1)
Sodium: 129 mmol/L — ABNORMAL LOW (ref 135–146)
Sodium: 132 mmol/L — ABNORMAL LOW (ref 135–145)

## 2022-04-07 LAB — POCT I-STAT, CHEM 8
BUN: 28 mg/dL — ABNORMAL HIGH (ref 8–23)
BUN: 21 mg/dL (ref 8–23)
BUN: 25 mg/dL — ABNORMAL HIGH (ref 8–23)
BUN: 22 mg/dL (ref 8–23)
BUN: 23 mg/dL (ref 8–23)
BUN: 23 mg/dL (ref 8–23)
Calcium, Ion: 1.16 mmol/L (ref 1.15–1.40)
Calcium, Ion: 0.95 mmol/L — ABNORMAL LOW (ref 1.15–1.40)
Calcium, Ion: 1.11 mmol/L — ABNORMAL LOW (ref 1.15–1.40)
Calcium, Ion: 0.82 mmol/L — CL (ref 1.15–1.40)
Calcium, Ion: 0.99 mmol/L — ABNORMAL LOW (ref 1.15–1.40)
Calcium, Ion: 0.92 mmol/L — ABNORMAL LOW (ref 1.15–1.40)
Chloride: 90 mmol/L — ABNORMAL LOW (ref 98–111)
Chloride: 87 mmol/L — ABNORMAL LOW (ref 98–111)
Chloride: 89 mmol/L — ABNORMAL LOW (ref 98–111)
Chloride: 86 mmol/L — ABNORMAL LOW (ref 98–111)
Chloride: 88 mmol/L — ABNORMAL LOW (ref 98–111)
Chloride: 89 mmol/L — ABNORMAL LOW (ref 98–111)
Creatinine, Ser: 1.1 mg/dL — ABNORMAL HIGH (ref 0.44–1.00)
Creatinine, Ser: 0.9 mg/dL (ref 0.44–1.00)
Creatinine, Ser: 1 mg/dL (ref 0.44–1.00)
Creatinine, Ser: 0.8 mg/dL (ref 0.44–1.00)
Creatinine, Ser: 0.8 mg/dL (ref 0.44–1.00)
Creatinine, Ser: 0.8 mg/dL (ref 0.44–1.00)
Glucose, Bld: 139 mg/dL — ABNORMAL HIGH (ref 70–99)
Glucose, Bld: 152 mg/dL — ABNORMAL HIGH (ref 70–99)
Glucose, Bld: 196 mg/dL — ABNORMAL HIGH (ref 70–99)
Glucose, Bld: 141 mg/dL — ABNORMAL HIGH (ref 70–99)
Glucose, Bld: 162 mg/dL — ABNORMAL HIGH (ref 70–99)
Glucose, Bld: 137 mg/dL — ABNORMAL HIGH (ref 70–99)
HCT: 32 % — ABNORMAL LOW (ref 36.0–46.0)
HCT: 21 % — ABNORMAL LOW (ref 36.0–46.0)
HCT: 31 % — ABNORMAL LOW (ref 36.0–46.0)
HCT: 21 % — ABNORMAL LOW (ref 36.0–46.0)
HCT: 24 % — ABNORMAL LOW (ref 36.0–46.0)
HCT: 26 % — ABNORMAL LOW (ref 36.0–46.0)
Hemoglobin: 10.9 g/dL — ABNORMAL LOW (ref 12.0–15.0)
Hemoglobin: 10.5 g/dL — ABNORMAL LOW (ref 12.0–15.0)
Hemoglobin: 7.1 g/dL — ABNORMAL LOW (ref 12.0–15.0)
Hemoglobin: 7.1 g/dL — ABNORMAL LOW (ref 12.0–15.0)
Hemoglobin: 8.2 g/dL — ABNORMAL LOW (ref 12.0–15.0)
Hemoglobin: 8.8 g/dL — ABNORMAL LOW (ref 12.0–15.0)
Potassium: 3.2 mmol/L — ABNORMAL LOW (ref 3.5–5.1)
Potassium: 2.7 mmol/L — CL (ref 3.5–5.1)
Potassium: 2.9 mmol/L — ABNORMAL LOW (ref 3.5–5.1)
Potassium: 3.5 mmol/L (ref 3.5–5.1)
Potassium: 4 mmol/L (ref 3.5–5.1)
Potassium: 3.6 mmol/L (ref 3.5–5.1)
Sodium: 130 mmol/L — ABNORMAL LOW (ref 135–145)
Sodium: 128 mmol/L — ABNORMAL LOW (ref 135–145)
Sodium: 130 mmol/L — ABNORMAL LOW (ref 135–145)
Sodium: 127 mmol/L — ABNORMAL LOW (ref 135–145)
Sodium: 129 mmol/L — ABNORMAL LOW (ref 135–145)
Sodium: 131 mmol/L — ABNORMAL LOW (ref 135–145)
TCO2: 32 mmol/L (ref 22–32)
TCO2: 28 mmol/L (ref 22–32)
TCO2: 32 mmol/L (ref 22–32)
TCO2: 31 mmol/L (ref 22–32)
TCO2: 33 mmol/L — ABNORMAL HIGH (ref 22–32)
TCO2: 30 mmol/L (ref 22–32)

## 2022-04-07 LAB — POCT I-STAT 7, (LYTES, BLD GAS, ICA,H+H)
Acid-Base Excess: 6 mmol/L — ABNORMAL HIGH (ref 0.0–2.0)
Acid-Base Excess: 8 mmol/L — ABNORMAL HIGH (ref 0.0–2.0)
Acid-Base Excess: 9 mmol/L — ABNORMAL HIGH (ref 0.0–2.0)
Acid-Base Excess: 6 mmol/L — ABNORMAL HIGH (ref 0.0–2.0)
Acid-Base Excess: 2 mmol/L (ref 0.0–2.0)
Acid-Base Excess: 5 mmol/L — ABNORMAL HIGH (ref 0.0–2.0)
Acid-Base Excess: 2 mmol/L (ref 0.0–2.0)
Acid-Base Excess: 2 mmol/L (ref 0.0–2.0)
Acid-base deficit: 2 mmol/L (ref 0.0–2.0)
Bicarbonate: 31.3 mmol/L — ABNORMAL HIGH (ref 20.0–28.0)
Bicarbonate: 22.5 mmol/L (ref 20.0–28.0)
Bicarbonate: 32 mmol/L — ABNORMAL HIGH (ref 20.0–28.0)
Bicarbonate: 32.4 mmol/L — ABNORMAL HIGH (ref 20.0–28.0)
Bicarbonate: 31.7 mmol/L — ABNORMAL HIGH (ref 20.0–28.0)
Bicarbonate: 27.3 mmol/L (ref 20.0–28.0)
Bicarbonate: 29.6 mmol/L — ABNORMAL HIGH (ref 20.0–28.0)
Bicarbonate: 25.9 mmol/L (ref 20.0–28.0)
Bicarbonate: 25.8 mmol/L (ref 20.0–28.0)
Calcium, Ion: 1.17 mmol/L (ref 1.15–1.40)
Calcium, Ion: 0.93 mmol/L — ABNORMAL LOW (ref 1.15–1.40)
Calcium, Ion: 0.98 mmol/L — ABNORMAL LOW (ref 1.15–1.40)
Calcium, Ion: 0.99 mmol/L — ABNORMAL LOW (ref 1.15–1.40)
Calcium, Ion: 0.9 mmol/L — ABNORMAL LOW (ref 1.15–1.40)
Calcium, Ion: 0.93 mmol/L — ABNORMAL LOW (ref 1.15–1.40)
Calcium, Ion: 0.95 mmol/L — ABNORMAL LOW (ref 1.15–1.40)
Calcium, Ion: 1.07 mmol/L — ABNORMAL LOW (ref 1.15–1.40)
Calcium, Ion: 1.07 mmol/L — ABNORMAL LOW (ref 1.15–1.40)
HCT: 35 % — ABNORMAL LOW (ref 36.0–46.0)
HCT: 21 % — ABNORMAL LOW (ref 36.0–46.0)
HCT: 23 % — ABNORMAL LOW (ref 36.0–46.0)
HCT: 24 % — ABNORMAL LOW (ref 36.0–46.0)
HCT: 22 % — ABNORMAL LOW (ref 36.0–46.0)
HCT: 25 % — ABNORMAL LOW (ref 36.0–46.0)
HCT: 26 % — ABNORMAL LOW (ref 36.0–46.0)
HCT: 30 % — ABNORMAL LOW (ref 36.0–46.0)
HCT: 26 % — ABNORMAL LOW (ref 36.0–46.0)
Hemoglobin: 11.9 g/dL — ABNORMAL LOW (ref 12.0–15.0)
Hemoglobin: 7.1 g/dL — ABNORMAL LOW (ref 12.0–15.0)
Hemoglobin: 7.8 g/dL — ABNORMAL LOW (ref 12.0–15.0)
Hemoglobin: 8.2 g/dL — ABNORMAL LOW (ref 12.0–15.0)
Hemoglobin: 7.5 g/dL — ABNORMAL LOW (ref 12.0–15.0)
Hemoglobin: 8.5 g/dL — ABNORMAL LOW (ref 12.0–15.0)
Hemoglobin: 8.8 g/dL — ABNORMAL LOW (ref 12.0–15.0)
Hemoglobin: 10.2 g/dL — ABNORMAL LOW (ref 12.0–15.0)
Hemoglobin: 8.8 g/dL — ABNORMAL LOW (ref 12.0–15.0)
O2 Saturation: 100 %
O2 Saturation: 100 %
O2 Saturation: 100 %
O2 Saturation: 81 %
O2 Saturation: 100 %
O2 Saturation: 100 %
O2 Saturation: 100 %
O2 Saturation: 100 %
O2 Saturation: 100 %
Patient temperature: 36.5
Patient temperature: 37
Potassium: 3.2 mmol/L — ABNORMAL LOW (ref 3.5–5.1)
Potassium: 2.7 mmol/L — CL (ref 3.5–5.1)
Potassium: 3 mmol/L — ABNORMAL LOW (ref 3.5–5.1)
Potassium: 3.2 mmol/L — ABNORMAL LOW (ref 3.5–5.1)
Potassium: 3.7 mmol/L (ref 3.5–5.1)
Potassium: 3.6 mmol/L (ref 3.5–5.1)
Potassium: 4.2 mmol/L (ref 3.5–5.1)
Potassium: 3.6 mmol/L (ref 3.5–5.1)
Potassium: 3.4 mmol/L — ABNORMAL LOW (ref 3.5–5.1)
Sodium: 129 mmol/L — ABNORMAL LOW (ref 135–145)
Sodium: 130 mmol/L — ABNORMAL LOW (ref 135–145)
Sodium: 131 mmol/L — ABNORMAL LOW (ref 135–145)
Sodium: 131 mmol/L — ABNORMAL LOW (ref 135–145)
Sodium: 129 mmol/L — ABNORMAL LOW (ref 135–145)
Sodium: 126 mmol/L — ABNORMAL LOW (ref 135–145)
Sodium: 130 mmol/L — ABNORMAL LOW (ref 135–145)
Sodium: 131 mmol/L — ABNORMAL LOW (ref 135–145)
Sodium: 133 mmol/L — ABNORMAL LOW (ref 135–145)
TCO2: 33 mmol/L — ABNORMAL HIGH (ref 22–32)
TCO2: 24 mmol/L (ref 22–32)
TCO2: 33 mmol/L — ABNORMAL HIGH (ref 22–32)
TCO2: 34 mmol/L — ABNORMAL HIGH (ref 22–32)
TCO2: 33 mmol/L — ABNORMAL HIGH (ref 22–32)
TCO2: 29 mmol/L (ref 22–32)
TCO2: 31 mmol/L (ref 22–32)
TCO2: 27 mmol/L (ref 22–32)
TCO2: 27 mmol/L (ref 22–32)
pCO2 arterial: 45.5 mmHg (ref 32–48)
pCO2 arterial: 35.8 mmHg (ref 32–48)
pCO2 arterial: 37.8 mmHg (ref 32–48)
pCO2 arterial: 43.7 mmHg (ref 32–48)
pCO2 arterial: 51.1 mmHg — ABNORMAL HIGH (ref 32–48)
pCO2 arterial: 44.5 mmHg (ref 32–48)
pCO2 arterial: 46.1 mmHg (ref 32–48)
pCO2 arterial: 36.5 mmHg (ref 32–48)
pCO2 arterial: 34.6 mmHg (ref 32–48)
pH, Arterial: 7.446 (ref 7.35–7.45)
pH, Arterial: 7.406 (ref 7.35–7.45)
pH, Arterial: 7.472 — ABNORMAL HIGH (ref 7.35–7.45)
pH, Arterial: 7.54 — ABNORMAL HIGH (ref 7.35–7.45)
pH, Arterial: 7.401 (ref 7.35–7.45)
pH, Arterial: 7.396 (ref 7.35–7.45)
pH, Arterial: 7.416 (ref 7.35–7.45)
pH, Arterial: 7.457 — ABNORMAL HIGH (ref 7.35–7.45)
pH, Arterial: 7.482 — ABNORMAL HIGH (ref 7.35–7.45)
pO2, Arterial: 520 mmHg — ABNORMAL HIGH (ref 83–108)
pO2, Arterial: 439 mmHg — ABNORMAL HIGH (ref 83–108)
pO2, Arterial: 45 mmHg — ABNORMAL LOW (ref 83–108)
pO2, Arterial: 568 mmHg — ABNORMAL HIGH (ref 83–108)
pO2, Arterial: 591 mmHg — ABNORMAL HIGH (ref 83–108)
pO2, Arterial: 424 mmHg — ABNORMAL HIGH (ref 83–108)
pO2, Arterial: 501 mmHg — ABNORMAL HIGH (ref 83–108)
pO2, Arterial: 185 mmHg — ABNORMAL HIGH (ref 83–108)
pO2, Arterial: 193 mmHg — ABNORMAL HIGH (ref 83–108)

## 2022-04-07 LAB — CBC
HCT: 29.2 % — ABNORMAL LOW (ref 36.0–46.0)
HCT: 23.9 % — ABNORMAL LOW (ref 36.0–46.0)
Hemoglobin: 10.2 g/dL — ABNORMAL LOW (ref 12.0–15.0)
Hemoglobin: 8.4 g/dL — ABNORMAL LOW (ref 12.0–15.0)
MCH: 29.4 pg (ref 26.0–34.0)
MCH: 29.5 pg (ref 26.0–34.0)
MCHC: 34.9 g/dL (ref 30.0–36.0)
MCHC: 35.1 g/dL (ref 30.0–36.0)
MCV: 84.1 fL (ref 80.0–100.0)
MCV: 83.9 fL (ref 80.0–100.0)
Platelets: 135 10*3/uL — ABNORMAL LOW (ref 150–400)
Platelets: 135 10*3/uL — ABNORMAL LOW (ref 150–400)
RBC: 3.47 MIL/uL — ABNORMAL LOW (ref 3.87–5.11)
RBC: 2.85 MIL/uL — ABNORMAL LOW (ref 3.87–5.11)
RDW: 15.2 % (ref 11.5–15.5)
RDW: 15.2 % (ref 11.5–15.5)
WBC: 18.6 10*3/uL — ABNORMAL HIGH (ref 4.0–10.5)
WBC: 13.6 10*3/uL — ABNORMAL HIGH (ref 4.0–10.5)
nRBC: 0 % (ref 0.0–0.2)
nRBC: 0 % (ref 0.0–0.2)

## 2022-04-07 LAB — MAGNESIUM: Magnesium: 3.2 mg/dL — ABNORMAL HIGH (ref 1.7–2.4)

## 2022-04-07 LAB — APTT: aPTT: 41 seconds — ABNORMAL HIGH (ref 24–36)

## 2022-04-07 LAB — PROTIME-INR
INR: 1.7 — ABNORMAL HIGH (ref 0.8–1.2)
Prothrombin Time: 20 seconds — ABNORMAL HIGH (ref 11.4–15.2)

## 2022-04-07 LAB — GLUCOSE, CAPILLARY
Glucose-Capillary: 107 mg/dL — ABNORMAL HIGH (ref 70–99)
Glucose-Capillary: 156 mg/dL — ABNORMAL HIGH (ref 70–99)
Glucose-Capillary: 138 mg/dL — ABNORMAL HIGH (ref 70–99)
Glucose-Capillary: 136 mg/dL — ABNORMAL HIGH (ref 70–99)
Glucose-Capillary: 154 mg/dL — ABNORMAL HIGH (ref 70–99)
Glucose-Capillary: 118 mg/dL — ABNORMAL HIGH (ref 70–99)
Glucose-Capillary: 118 mg/dL — ABNORMAL HIGH (ref 70–99)
Glucose-Capillary: 140 mg/dL — ABNORMAL HIGH (ref 70–99)

## 2022-04-07 LAB — HEMOGLOBIN AND HEMATOCRIT, BLOOD
HCT: 24.1 % — ABNORMAL LOW (ref 36.0–46.0)
Hemoglobin: 8.5 g/dL — ABNORMAL LOW (ref 12.0–15.0)

## 2022-04-07 LAB — PREPARE RBC (CROSSMATCH)

## 2022-04-07 LAB — PLATELET COUNT: Platelets: 163 10*3/uL (ref 150–400)

## 2022-04-07 LAB — ECHO INTRAOPERATIVE TEE
Height: 66 in
Weight: 1824 oz

## 2022-04-07 LAB — FIBRINOGEN: Fibrinogen: 162 mg/dL — ABNORMAL LOW (ref 210–475)

## 2022-04-07 SURGERY — REPLACEMENT, AORTIC VALVE, OPEN
Anesthesia: General | Site: Chest

## 2022-04-07 MED ORDER — ACETAMINOPHEN 160 MG/5ML PO SOLN: 650.0000 mg | Freq: Once | ORAL | Status: AC

## 2022-04-07 MED ORDER — LACTATED RINGERS IV SOLN: INTRAVENOUS | Status: DC | PRN

## 2022-04-07 MED ORDER — MAGNESIUM SULFATE 4 GM/100ML IV SOLN
4.0000 g | Freq: Once | INTRAVENOUS | Status: AC
  Administered 2022-04-07: 4 g via INTRAVENOUS
  Filled 2022-04-07: qty 100

## 2022-04-07 MED ORDER — LACTATED RINGERS IV SOLN: INTRAVENOUS | Status: DC

## 2022-04-07 MED ORDER — MIDAZOLAM HCL (PF) 5 MG/ML IJ SOLN
INTRAMUSCULAR | Status: DC | PRN
  Administered 2022-04-07 (×3): 1 mg via INTRAVENOUS

## 2022-04-07 MED ORDER — PHENYLEPHRINE HCL-NACL 20-0.9 MG/250ML-% IV SOLN
INTRAVENOUS | Status: DC | PRN
  Administered 2022-04-07: 15 ug/min via INTRAVENOUS

## 2022-04-07 MED ORDER — CHLORHEXIDINE GLUCONATE 0.12 % MT SOLN
15.0000 mL | Freq: Once | OROMUCOSAL | Status: AC
  Administered 2022-04-07: 15 mL via OROMUCOSAL
  Filled 2022-04-07: qty 15

## 2022-04-07 MED ORDER — ~~LOC~~ CARDIAC SURGERY, PATIENT & FAMILY EDUCATION
Freq: Once | Status: DC
  Filled 2022-04-07: qty 1

## 2022-04-07 MED ORDER — TRANEXAMIC ACID 1000 MG/10ML IV SOLN
INTRAVENOUS | Status: DC | PRN
  Administered 2022-04-07: 1.5 mg/kg/h via INTRAVENOUS

## 2022-04-07 MED ORDER — PROPOFOL 10 MG/ML IV BOLUS
INTRAVENOUS | Status: AC
  Filled 2022-04-07: qty 20

## 2022-04-07 MED ORDER — NITROGLYCERIN IN D5W 200-5 MCG/ML-% IV SOLN: 0.0000 ug/min | INTRAVENOUS | Status: DC

## 2022-04-07 MED ORDER — CHLORHEXIDINE GLUCONATE 0.12 % MT SOLN: 15.0000 mL | Freq: Once | OROMUCOSAL | Status: DC

## 2022-04-07 MED ORDER — ORAL CARE MOUTH RINSE: 15.0000 mL | Freq: Once | OROMUCOSAL | Status: AC

## 2022-04-07 MED ORDER — SODIUM CHLORIDE 0.9% IV SOLUTION: Freq: Once | INTRAVENOUS | Status: DC

## 2022-04-07 MED ORDER — THROMBIN (RECOMBINANT) 20000 UNITS EX SOLR
CUTANEOUS | Status: DC | PRN
  Administered 2022-04-07: 20000 [IU] via TOPICAL

## 2022-04-07 MED ORDER — FENTANYL CITRATE (PF) 250 MCG/5ML IJ SOLN
INTRAMUSCULAR | Status: AC
  Filled 2022-04-07: qty 5

## 2022-04-07 MED ORDER — METOPROLOL TARTRATE 5 MG/5ML IV SOLN: 2.5000 mg | INTRAVENOUS | Status: DC | PRN

## 2022-04-07 MED ORDER — THROMBIN 20000 UNITS EX SOLR: OROMUCOSAL | Status: DC | PRN

## 2022-04-07 MED ORDER — POTASSIUM CHLORIDE 10 MEQ/50ML IV SOLN: 10.0000 meq | INTRAVENOUS | Status: DC

## 2022-04-07 MED ORDER — FAMOTIDINE IN NACL 20-0.9 MG/50ML-% IV SOLN
20.0000 mg | Freq: Two times a day (BID) | INTRAVENOUS | Status: AC
  Administered 2022-04-08: 20 mg via INTRAVENOUS
  Filled 2022-04-07: qty 50

## 2022-04-07 MED ORDER — ACETAMINOPHEN 650 MG RE SUPP
650.0000 mg | Freq: Once | RECTAL | Status: AC
  Administered 2022-04-07: 650 mg via RECTAL

## 2022-04-07 MED ORDER — CHLORHEXIDINE GLUCONATE 4 % EX LIQD: 30.0000 mL | CUTANEOUS | Status: DC

## 2022-04-07 MED ORDER — HEMOSTATIC AGENTS (NO CHARGE) OPTIME
TOPICAL | Status: DC | PRN
  Administered 2022-04-07: 1 via TOPICAL

## 2022-04-07 MED ORDER — PHENYLEPHRINE 80 MCG/ML (10ML) SYRINGE FOR IV PUSH (FOR BLOOD PRESSURE SUPPORT)
PREFILLED_SYRINGE | INTRAVENOUS | Status: AC
  Filled 2022-04-07: qty 10

## 2022-04-07 MED ORDER — INSULIN REGULAR(HUMAN) IN NACL 100-0.9 UT/100ML-% IV SOLN
INTRAVENOUS | Status: DC | PRN
  Administered 2022-04-07: .8 [IU]/h via INTRAVENOUS

## 2022-04-07 MED ORDER — CHLORHEXIDINE GLUCONATE 0.12 % MT SOLN
15.0000 mL | OROMUCOSAL | Status: AC
  Administered 2022-04-07: 15 mL via OROMUCOSAL

## 2022-04-07 MED ORDER — PROTAMINE SULFATE 10 MG/ML IV SOLN
INTRAVENOUS | Status: DC | PRN
  Administered 2022-04-07: 180 mg via INTRAVENOUS
  Administered 2022-04-07: 20 mg via INTRAVENOUS

## 2022-04-07 MED ORDER — ONDANSETRON HCL 4 MG/2ML IJ SOLN
INTRAMUSCULAR | Status: DC | PRN
  Administered 2022-04-07: 4 mg via INTRAVENOUS

## 2022-04-07 MED ORDER — DOCUSATE SODIUM 100 MG PO CAPS
200.0000 mg | ORAL_CAPSULE | Freq: Every day | ORAL | Status: DC
  Administered 2022-04-08 – 2022-04-10 (×3): 200 mg via ORAL
  Filled 2022-04-07 (×4): qty 2

## 2022-04-07 MED ORDER — LEVOFLOXACIN IN D5W 750 MG/150ML IV SOLN
750.0000 mg | INTRAVENOUS | Status: AC
  Administered 2022-04-08: 750 mg via INTRAVENOUS
  Filled 2022-04-07: qty 150

## 2022-04-07 MED ORDER — MIDAZOLAM HCL (PF) 10 MG/2ML IJ SOLN
INTRAMUSCULAR | Status: AC
  Filled 2022-04-07: qty 2

## 2022-04-07 MED ORDER — MIDAZOLAM HCL 2 MG/2ML IJ SOLN: 2.0000 mg | INTRAMUSCULAR | Status: DC | PRN

## 2022-04-07 MED ORDER — ONDANSETRON HCL 4 MG/2ML IJ SOLN
4.0000 mg | Freq: Four times a day (QID) | INTRAMUSCULAR | Status: DC | PRN
  Administered 2022-04-10: 4 mg via INTRAVENOUS
  Filled 2022-04-07: qty 2

## 2022-04-07 MED ORDER — PANTOPRAZOLE SODIUM 40 MG PO TBEC
40.0000 mg | DELAYED_RELEASE_TABLET | Freq: Every day | ORAL | Status: DC
  Administered 2022-04-09 – 2022-04-14 (×6): 40 mg via ORAL
  Filled 2022-04-07 (×7): qty 1

## 2022-04-07 MED ORDER — CLEVIDIPINE BUTYRATE 0.5 MG/ML IV EMUL
0.0000 mg/h | INTRAVENOUS | Status: DC
  Administered 2022-04-07: 2 mg/h via INTRAVENOUS
  Administered 2022-04-08: 1 mg/h via INTRAVENOUS
  Filled 2022-04-07 (×2): qty 50

## 2022-04-07 MED ORDER — SODIUM CHLORIDE 0.9 % IV SOLN: INTRAVENOUS | Status: DC

## 2022-04-07 MED ORDER — BISACODYL 10 MG RE SUPP: 10.0000 mg | Freq: Every day | RECTAL | Status: DC

## 2022-04-07 MED ORDER — 0.9 % SODIUM CHLORIDE (POUR BTL) OPTIME
TOPICAL | Status: DC | PRN
  Administered 2022-04-07: 6000 mL

## 2022-04-07 MED ORDER — PROTAMINE SULFATE 10 MG/ML IV SOLN
INTRAVENOUS | Status: AC
  Filled 2022-04-07: qty 25

## 2022-04-07 MED ORDER — DEXTROSE 50 % IV SOLN: 0.0000 mL | INTRAVENOUS | Status: DC | PRN

## 2022-04-07 MED ORDER — FENTANYL CITRATE (PF) 250 MCG/5ML IJ SOLN
INTRAMUSCULAR | Status: DC | PRN
  Administered 2022-04-07 (×2): 50 ug via INTRAVENOUS
  Administered 2022-04-07 (×4): 100 ug via INTRAVENOUS

## 2022-04-07 MED ORDER — ASPIRIN EC 325 MG PO TBEC
325.0000 mg | DELAYED_RELEASE_TABLET | Freq: Every day | ORAL | Status: DC
  Filled 2022-04-07 (×3): qty 1

## 2022-04-07 MED ORDER — CALCIUM CHLORIDE 10 % IV SOLN: INTRAVENOUS | Status: DC | PRN

## 2022-04-07 MED ORDER — SODIUM CHLORIDE 0.9% FLUSH
3.0000 mL | Freq: Two times a day (BID) | INTRAVENOUS | Status: DC
  Administered 2022-04-08 – 2022-04-11 (×7): 3 mL via INTRAVENOUS

## 2022-04-07 MED ORDER — LACTATED RINGERS IV SOLN: 500.0000 mL | Freq: Once | INTRAVENOUS | Status: DC | PRN

## 2022-04-07 MED ORDER — CHLORHEXIDINE GLUCONATE CLOTH 2 % EX PADS
6.0000 | MEDICATED_PAD | Freq: Every day | CUTANEOUS | Status: DC
  Administered 2022-04-07 – 2022-04-11 (×6): 6 via TOPICAL

## 2022-04-07 MED ORDER — OXYCODONE HCL 5 MG PO TABS
5.0000 mg | ORAL_TABLET | ORAL | Status: DC | PRN
  Administered 2022-04-08: 5 mg via ORAL
  Administered 2022-04-08 (×2): 10 mg via ORAL
  Administered 2022-04-08: 5 mg via ORAL
  Administered 2022-04-11: 10 mg via ORAL
  Filled 2022-04-07 (×2): qty 1
  Filled 2022-04-07 (×3): qty 2

## 2022-04-07 MED ORDER — DEXMEDETOMIDINE HCL IN NACL 200 MCG/50ML IV SOLN
INTRAVENOUS | Status: DC | PRN
  Administered 2022-04-07: .3 ug/kg/h via INTRAVENOUS

## 2022-04-07 MED ORDER — ACETAMINOPHEN 160 MG/5ML PO SOLN: 1000.0000 mg | Freq: Four times a day (QID) | ORAL | Status: DC

## 2022-04-07 MED ORDER — LEVOTHYROXINE SODIUM 50 MCG PO TABS
50.0000 ug | ORAL_TABLET | Freq: Every day | ORAL | Status: DC
  Administered 2022-04-08 – 2022-04-14 (×7): 50 ug via ORAL
  Filled 2022-04-07 (×7): qty 1

## 2022-04-07 MED ORDER — PROPOFOL 10 MG/ML IV BOLUS
INTRAVENOUS | Status: DC | PRN
  Administered 2022-04-07 (×2): 20 mg via INTRAVENOUS
  Administered 2022-04-07: 80 mg via INTRAVENOUS

## 2022-04-07 MED ORDER — DEXMEDETOMIDINE HCL IN NACL 400 MCG/100ML IV SOLN
0.0000 ug/kg/h | INTRAVENOUS | Status: DC
  Administered 2022-04-07: 0.4 ug/kg/h via INTRAVENOUS
  Filled 2022-04-07: qty 100

## 2022-04-07 MED ORDER — TRAMADOL HCL 50 MG PO TABS: 50.0000 mg | ORAL_TABLET | ORAL | Status: DC | PRN

## 2022-04-07 MED ORDER — POTASSIUM CHLORIDE 10 MEQ/100ML IV SOLN
10.0000 meq | INTRAVENOUS | Status: AC
  Administered 2022-04-07 (×3): 10 meq via INTRAVENOUS
  Filled 2022-04-07 (×2): qty 100

## 2022-04-07 MED ORDER — ALBUMIN HUMAN 5 % IV SOLN: INTRAVENOUS | Status: DC | PRN

## 2022-04-07 MED ORDER — HEPARIN SODIUM (PORCINE) 1000 UNIT/ML IJ SOLN
INTRAMUSCULAR | Status: DC | PRN
  Administered 2022-04-07: 18000 [IU] via INTRAVENOUS

## 2022-04-07 MED ORDER — CLEVIDIPINE BUTYRATE 0.5 MG/ML IV EMUL
INTRAVENOUS | Status: AC
  Filled 2022-04-07: qty 50

## 2022-04-07 MED ORDER — PHENYLEPHRINE HCL-NACL 20-0.9 MG/250ML-% IV SOLN: 0.0000 ug/min | INTRAVENOUS | Status: DC

## 2022-04-07 MED ORDER — NOREPINEPHRINE 4 MG/250ML-% IV SOLN: 0.0000 ug/min | INTRAVENOUS | Status: DC

## 2022-04-07 MED ORDER — ASPIRIN 81 MG PO CHEW
324.0000 mg | CHEWABLE_TABLET | Freq: Every day | ORAL | Status: DC
  Filled 2022-04-07: qty 4

## 2022-04-07 MED ORDER — MORPHINE SULFATE (PF) 2 MG/ML IV SOLN
1.0000 mg | INTRAVENOUS | Status: DC | PRN
  Administered 2022-04-08 – 2022-04-09 (×2): 2 mg via INTRAVENOUS
  Filled 2022-04-07 (×2): qty 1

## 2022-04-07 MED ORDER — SODIUM CHLORIDE 0.45 % IV SOLN: INTRAVENOUS | Status: DC | PRN

## 2022-04-07 MED ORDER — VANCOMYCIN HCL IN DEXTROSE 1-5 GM/200ML-% IV SOLN
1000.0000 mg | Freq: Once | INTRAVENOUS | Status: AC
  Administered 2022-04-07: 1000 mg via INTRAVENOUS
  Filled 2022-04-07: qty 200

## 2022-04-07 MED ORDER — INSULIN REGULAR(HUMAN) IN NACL 100-0.9 UT/100ML-% IV SOLN: INTRAVENOUS | Status: DC

## 2022-04-07 MED ORDER — LEVOFLOXACIN IN D5W 500 MG/100ML IV SOLN
INTRAVENOUS | Status: DC | PRN
  Administered 2022-04-07: 500 mg via INTRAVENOUS

## 2022-04-07 MED ORDER — ALBUMIN HUMAN 5 % IV SOLN
250.0000 mL | INTRAVENOUS | Status: DC | PRN
  Filled 2022-04-07: qty 250

## 2022-04-07 MED ORDER — PHENYLEPHRINE 80 MCG/ML (10ML) SYRINGE FOR IV PUSH (FOR BLOOD PRESSURE SUPPORT)
PREFILLED_SYRINGE | INTRAVENOUS | Status: DC | PRN
  Administered 2022-04-07 (×4): 80 ug via INTRAVENOUS

## 2022-04-07 MED ORDER — PLASMA-LYTE A IV SOLN: INTRAVENOUS | Status: DC | PRN

## 2022-04-07 MED ORDER — METOPROLOL TARTRATE 25 MG/10 ML ORAL SUSPENSION: 12.5000 mg | Freq: Two times a day (BID) | ORAL | Status: DC

## 2022-04-07 MED ORDER — SODIUM CHLORIDE 0.9 % IV SOLN
250.0000 mL | INTRAVENOUS | Status: DC
  Administered 2022-04-08: 250 mL via INTRAVENOUS

## 2022-04-07 MED ORDER — CALCIUM CHLORIDE 10 % IV SOLN
INTRAVENOUS | Status: DC | PRN
  Administered 2022-04-07 (×3): 100 mg via INTRAVENOUS

## 2022-04-07 MED ORDER — METOPROLOL TARTRATE 12.5 MG HALF TABLET
12.5000 mg | ORAL_TABLET | Freq: Two times a day (BID) | ORAL | Status: DC
  Administered 2022-04-08 (×2): 12.5 mg via ORAL
  Filled 2022-04-07 (×3): qty 1

## 2022-04-07 MED ORDER — ROCURONIUM BROMIDE 10 MG/ML (PF) SYRINGE
PREFILLED_SYRINGE | INTRAVENOUS | Status: DC | PRN
  Administered 2022-04-07: 40 mg via INTRAVENOUS
  Administered 2022-04-07: 50 mg via INTRAVENOUS
  Administered 2022-04-07: 60 mg via INTRAVENOUS
  Administered 2022-04-07: 30 mg via INTRAVENOUS

## 2022-04-07 MED ORDER — THROMBIN (RECOMBINANT) 20000 UNITS EX SOLR
CUTANEOUS | Status: AC
  Filled 2022-04-07: qty 20000

## 2022-04-07 MED ORDER — ROCURONIUM BROMIDE 10 MG/ML (PF) SYRINGE
PREFILLED_SYRINGE | INTRAVENOUS | Status: AC
  Filled 2022-04-07: qty 20

## 2022-04-07 MED ORDER — BISACODYL 5 MG PO TBEC
10.0000 mg | DELAYED_RELEASE_TABLET | Freq: Every day | ORAL | Status: DC
  Administered 2022-04-09 – 2022-04-10 (×2): 10 mg via ORAL
  Filled 2022-04-07 (×3): qty 2

## 2022-04-07 MED ORDER — METOPROLOL TARTRATE 12.5 MG HALF TABLET: 12.5000 mg | ORAL_TABLET | Freq: Once | ORAL | Status: DC

## 2022-04-07 MED ORDER — ONDANSETRON HCL 4 MG/2ML IJ SOLN
INTRAMUSCULAR | Status: AC
  Filled 2022-04-07: qty 2

## 2022-04-07 MED ORDER — ACETAMINOPHEN 500 MG PO TABS
1000.0000 mg | ORAL_TABLET | Freq: Four times a day (QID) | ORAL | Status: DC
Start: 2022-04-08 — End: 2022-04-11
  Administered 2022-04-08: 1000 mg via ORAL
  Administered 2022-04-08: 500 mg via ORAL
  Administered 2022-04-09: 1000 mg via ORAL
  Filled 2022-04-07 (×3): qty 2

## 2022-04-07 MED ORDER — ATORVASTATIN CALCIUM 10 MG PO TABS
20.0000 mg | ORAL_TABLET | Freq: Every day | ORAL | Status: DC
  Administered 2022-04-08 – 2022-04-14 (×7): 20 mg via ORAL
  Filled 2022-04-07 (×9): qty 2

## 2022-04-07 MED ORDER — SODIUM CHLORIDE 0.9% FLUSH: 3.0000 mL | INTRAVENOUS | Status: DC | PRN

## 2022-04-07 MED FILL — Lidocaine HCl Local Preservative Free (PF) Inj 2%: INTRAMUSCULAR | Qty: 15 | Status: AC

## 2022-04-07 MED FILL — Potassium Chloride Inj 2 mEq/ML: INTRAVENOUS | Qty: 40 | Status: AC

## 2022-04-07 MED FILL — Heparin Sodium (Porcine) Inj 1000 Unit/ML: Qty: 1000 | Status: AC

## 2022-04-07 SURGICAL SUPPLY — 96 items
ADAPTER CARDIO PERF ANTE/RETRO (ADAPTER) ×3 IMPLANT
ADH SKN CLS APL DERMABOND .7 (GAUZE/BANDAGES/DRESSINGS) ×2
ADPR PRFSN 84XANTGRD RTRGD (ADAPTER) ×2
BAG DECANTER FOR FLEXI CONT (MISCELLANEOUS) ×3 IMPLANT
BLADE CLIPPER SURG (BLADE) ×3 IMPLANT
BLADE STERNUM SYSTEM 6 (BLADE) ×3 IMPLANT
BLADE SURG 11 STRL SS (BLADE) ×1 IMPLANT
BLADE SURG 15 STRL LF DISP TIS (BLADE) ×2 IMPLANT
BLADE SURG 15 STRL SS (BLADE) ×3
CANISTER SUCT 3000ML PPV (MISCELLANEOUS) ×3 IMPLANT
CANNULA FEM BIOMEDICUS 25FR (CANNULA) ×1 IMPLANT
CANNULA GUNDRY RCSP 15FR (MISCELLANEOUS) ×3 IMPLANT
CANNULA MC2 2 STG 32/40 NON-V (CANNULA) IMPLANT
CANNULA SUMP PERICARDIAL (CANNULA) ×1 IMPLANT
CANNULA VENOUS 2 STG 32/40 (CANNULA)
CATH HEART VENT LEFT (CATHETERS) ×2 IMPLANT
CATH ROBINSON RED A/P 18FR (CATHETERS) ×9 IMPLANT
CATH THORACIC 28FR (CATHETERS) ×1 IMPLANT
CATH THORACIC 36FR (CATHETERS) ×4 IMPLANT
CATH THORACIC 36FR RT ANG (CATHETERS) ×3 IMPLANT
CNTNR URN SCR LID CUP LEK RST (MISCELLANEOUS) ×2 IMPLANT
CONN ST 3/8 X 1/2 (MISCELLANEOUS) ×1 IMPLANT
CONT SPEC 4OZ STRL OR WHT (MISCELLANEOUS) ×3
CONTAINER PROTECT SURGISLUSH (MISCELLANEOUS) ×6 IMPLANT
COVER PROBE W GEL 5X96 (DRAPES) ×1 IMPLANT
COVER SURGICAL LIGHT HANDLE (MISCELLANEOUS) ×3 IMPLANT
DERMABOND ADVANCED (GAUZE/BANDAGES/DRESSINGS) ×1
DERMABOND ADVANCED .7 DNX12 (GAUZE/BANDAGES/DRESSINGS) IMPLANT
DEVICE SUT CK QUICK LOAD MINI (Prosthesis & Implant Heart) ×1 IMPLANT
DRAPE CARDIOVASCULAR INCISE (DRAPES) ×3
DRAPE SRG 135X102X78XABS (DRAPES) ×3 IMPLANT
DRAPE WARM FLUID 44X44 (DRAPES) ×3 IMPLANT
DRSG COVADERM 4X14 (GAUZE/BANDAGES/DRESSINGS) ×3 IMPLANT
ELECT CAUTERY BLADE 6.4 (BLADE) ×3 IMPLANT
ELECT REM PT RETURN 9FT ADLT (ELECTROSURGICAL) ×6
ELECTRODE REM PT RTRN 9FT ADLT (ELECTROSURGICAL) ×4 IMPLANT
FELT TEFLON 1X6 (MISCELLANEOUS) ×6 IMPLANT
GAUZE 4X4 16PLY ~~LOC~~+RFID DBL (SPONGE) ×3 IMPLANT
GAUZE SPONGE 4X4 12PLY STRL (GAUZE/BANDAGES/DRESSINGS) ×3 IMPLANT
GAUZE SPONGE 4X4 12PLY STRL LF (GAUZE/BANDAGES/DRESSINGS) ×2 IMPLANT
GLOVE BIO SURGEON STRL SZ 6 (GLOVE) IMPLANT
GLOVE BIO SURGEON STRL SZ 6.5 (GLOVE) IMPLANT
GLOVE BIO SURGEON STRL SZ7 (GLOVE) IMPLANT
GLOVE BIO SURGEON STRL SZ7.5 (GLOVE) IMPLANT
GLOVE SURG MICRO LTX SZ7 (GLOVE) ×6 IMPLANT
GOWN STRL REUS W/ TWL LRG LVL3 (GOWN DISPOSABLE) ×8 IMPLANT
GOWN STRL REUS W/ TWL XL LVL3 (GOWN DISPOSABLE) ×2 IMPLANT
GOWN STRL REUS W/TWL LRG LVL3 (GOWN DISPOSABLE) ×12
GOWN STRL REUS W/TWL XL LVL3 (GOWN DISPOSABLE) ×3
GUIDEWIRE MEIER 185 .035 J-TIP (WIRE) ×1 IMPLANT
HEMOSTAT POWDER SURGIFOAM 1G (HEMOSTASIS) ×9 IMPLANT
HEMOSTAT SURGICEL 2X14 (HEMOSTASIS) ×3 IMPLANT
KIT BASIN OR (CUSTOM PROCEDURE TRAY) ×3 IMPLANT
KIT DILATOR VASC 18G NDL (KITS) ×1 IMPLANT
KIT SUCTION CATH 14FR (SUCTIONS) ×3 IMPLANT
KIT SUT CK MINI COMBO 4X17 (Prosthesis & Implant Heart) ×1 IMPLANT
KIT TURNOVER KIT B (KITS) ×3 IMPLANT
LINE VENT (MISCELLANEOUS) ×1 IMPLANT
NS IRRIG 1000ML POUR BTL (IV SOLUTION) ×18 IMPLANT
PACK ACCESSORY CANNULA KIT (KITS) ×1 IMPLANT
PACK E OPEN HEART (SUTURE) ×3 IMPLANT
PACK OPEN HEART (CUSTOM PROCEDURE TRAY) ×3 IMPLANT
PAD ARMBOARD 7.5X6 YLW CONV (MISCELLANEOUS) ×6 IMPLANT
POSITIONER HEAD DONUT 9IN (MISCELLANEOUS) ×3 IMPLANT
SEALANT SURG COSEAL 8ML (VASCULAR PRODUCTS) ×1 IMPLANT
SET MPS 3-ND DEL (MISCELLANEOUS) ×1 IMPLANT
SHEATH BRITE TIP 7FRX11 (SHEATH) ×1 IMPLANT
SPONGE T-LAP 18X18 ~~LOC~~+RFID (SPONGE) ×13 IMPLANT
SPONGE T-LAP 4X18 ~~LOC~~+RFID (SPONGE) ×3 IMPLANT
SUT BONE WAX W31G (SUTURE) ×3 IMPLANT
SUT EB EXC GRN/WHT 2-0 V-5 (SUTURE) ×6 IMPLANT
SUT ETHIBON EXCEL 2-0 V-5 (SUTURE) ×10 IMPLANT
SUT ETHIBOND 2 0 SH (SUTURE) ×3
SUT ETHIBOND 2 0 SH 36X2 (SUTURE) IMPLANT
SUT ETHIBOND V-5 VALVE (SUTURE) ×10 IMPLANT
SUT PROLENE 3 0 SH DA (SUTURE) IMPLANT
SUT PROLENE 3 0 SH1 36 (SUTURE) ×3 IMPLANT
SUT PROLENE 4 0 RB 1 (SUTURE) ×9
SUT PROLENE 4-0 RB1 .5 CRCL 36 (SUTURE) ×6 IMPLANT
SUT SILK  1 MH (SUTURE) ×3
SUT SILK 1 MH (SUTURE) IMPLANT
SUT STEEL 6MS V (SUTURE) ×2 IMPLANT
SUT VIC AB 1 CTX 36 (SUTURE) ×9
SUT VIC AB 1 CTX36XBRD ANBCTR (SUTURE) ×4 IMPLANT
SYSTEM SAHARA CHEST DRAIN ATS (WOUND CARE) ×3 IMPLANT
TAPE CLOTH SURG 4X10 WHT LF (GAUZE/BANDAGES/DRESSINGS) ×2 IMPLANT
TAPE PAPER 2X10 WHT MICROPORE (GAUZE/BANDAGES/DRESSINGS) ×1 IMPLANT
TOWEL GREEN STERILE (TOWEL DISPOSABLE) ×3 IMPLANT
TOWEL GREEN STERILE FF (TOWEL DISPOSABLE) ×3 IMPLANT
TRAY FOLEY SLVR 16FR TEMP STAT (SET/KITS/TRAYS/PACK) ×3 IMPLANT
TUBE CONNECTING 12X1/4 (SUCTIONS) ×1 IMPLANT
UNDERPAD 30X36 HEAVY ABSORB (UNDERPADS AND DIAPERS) ×3 IMPLANT
VALVE AORTIC SZ19 INSP/RESIL (Valve) ×1 IMPLANT
VENT LEFT HEART 12002 (CATHETERS) ×3
WATER STERILE IRR 1000ML POUR (IV SOLUTION) ×6 IMPLANT
YANKAUER SUCT BULB TIP NO VENT (SUCTIONS) ×1 IMPLANT

## 2022-04-07 NOTE — Anesthesia Preprocedure Evaluation (Addendum)
Anesthesia Evaluation  ?Patient identified by MRN, date of birth, ID band ?Patient awake ? ? ? ?Reviewed: ?Allergy & Precautions, NPO status , Patient's Chart, lab work & pertinent test results ? ?Airway ?Mallampati: II ? ?TM Distance: >3 FB ?Neck ROM: Full ? ? ? Dental ? ?(+) Teeth Intact, Dental Advisory Given ?  ?Pulmonary ?COPD,  ?  ?breath sounds clear to auscultation ? ? ? ? ? ? Cardiovascular ?hypertension, Pt. on home beta blockers ?+ dysrhythmias Atrial Fibrillation + Valvular Problems/Murmurs MR  ?Rhythm:Regular Rate:Normal ? ?Echo: ??1. There is severe aortic regurgitation due to incomplete leaflet  ?coaptation. The leaflets appear thickened and retracted. There is  ?restricted movement in systole, but no significant stenosis. The  ?regurgitation appears to be central but also into the  ?commissure between the NCC/LCC. 2D PISA radius 0.7 cm, 2D VC 0.5 cm, 2D  ?ERO 0.26 cm2, R vol 37 cc, RF 61%. PHT ~242 msec. There is holodiastolic  ?flow reversal in the descending aorta consistent with severe aortic  ?regurgitation. The aortic valve is  ?tricuspid. Aortic valve regurgitation is severe. No aortic stenosis is  ?present.  ??2. Left ventricular ejection fraction, by estimation, is 55 to 60%. The  ?left ventricle has normal function.  ??3. Right ventricular systolic function is normal. The right ventricular  ?size is normal.  ??4. No left atrial/left atrial appendage thrombus was detected. The LAA  ?emptying velocity was 43 cm/s.  ??5. 22m Medtronic Mosaic prosthetic valve in mitral position (09/13/2017).  ?MG 3 mmHG @ 84 bpm, E max 1.8 m/s, EOA 1.52 cm2. No evidence of  ?regurgitation or paravalvular leak. The mitral valve has been  ?repaired/replaced. No evidence of mitral valve  ?regurgitation. There is a 25 mm Medtronic Mosaic bioprosthetic valve  ?present in the mitral position. Procedure Date: 09/13/2017.  ??6. There is mild (Grade II) layered plaque involving the  descending  ?aorta.  ?  ?Neuro/Psych ?negative neurological ROS ? negative psych ROS  ? GI/Hepatic ?Neg liver ROS, PUD, GERD  Medicated,  ?Endo/Other  ?negative endocrine ROS ? Renal/GU ?Renal InsufficiencyRenal disease  ? ?  ?Musculoskeletal ?negative musculoskeletal ROS ?(+)  ? Abdominal ?Normal abdominal exam  (+)   ?Peds ? Hematology ?negative hematology ROS ?(+)   ?Anesthesia Other Findings ? ? Reproductive/Obstetrics ? ?  ? ? ? ? ? ? ? ? ? ? ? ? ? ?  ?  ? ? ? ? ? ? ? ?Anesthesia Physical ?Anesthesia Plan ? ?ASA: 4 ? ?Anesthesia Plan: General  ? ?Post-op Pain Management:   ? ?Induction: Intravenous ? ?PONV Risk Score and Plan: 3 and Ondansetron ? ?Airway Management Planned: Oral ETT ? ?Additional Equipment: Arterial line, CVP, PA Cath, Ultrasound Guidance Line Placement and TEE ? ?Intra-op Plan:  ? ?Post-operative Plan: Post-operative intubation/ventilation ? ?Informed Consent: I have reviewed the patients History and Physical, chart, labs and discussed the procedure including the risks, benefits and alternatives for the proposed anesthesia with the patient or authorized representative who has indicated his/her understanding and acceptance.  ? ? ? ? ? ?Plan Discussed with: CRNA ? ?Anesthesia Plan Comments:   ? ? ? ? ? ?Anesthesia Quick Evaluation ? ?

## 2022-04-07 NOTE — Anesthesia Procedure Notes (Signed)
Central Venous Catheter Insertion ?Performed by: Effie Berkshire, MD, anesthesiologist ?Start/End4/27/2023 7:05 AM, 04/07/2022 7:15 AM ?Patient location: Pre-op. ?Preanesthetic checklist: patient identified, IV checked, site marked, risks and benefits discussed, surgical consent, monitors and equipment checked, pre-op evaluation, timeout performed and anesthesia consent ?Position: Trendelenburg ?Lidocaine 1% used for infiltration and patient sedated ?Hand hygiene performed , maximum sterile barriers used  and Seldinger technique used ?Catheter size: 8.5 Fr ?Total catheter length 10. ?Central line was placed.Sheath introducer ?Swan type:thermodilution ?PA Cath depth:50 ?Procedure performed using ultrasound guided technique. ?Ultrasound Notes:anatomy identified, needle tip was noted to be adjacent to the nerve/plexus identified, no ultrasound evidence of intravascular and/or intraneural injection and image(s) printed for medical record ?Attempts: 1 ?Following insertion, line sutured, dressing applied and Biopatch. ?Post procedure assessment: blood return through all ports, free fluid flow and no air ? ?Patient tolerated the procedure well with no immediate complications. ? ? ? ? ?

## 2022-04-07 NOTE — Progress Notes (Signed)
Paged [employer] again, representative not present at this time. ?

## 2022-04-07 NOTE — Anesthesia Postprocedure Evaluation (Signed)
Anesthesia Post Note ? ?Patient: Patricia [name] ? ?Procedure(s) Performed: AORTIC VALVE REPLACEMENT (AVR), USING INSPIRIS 19 MM AORTIC VALVE (Chest) ?TRANSESOPHAGEAL ECHOCARDIOGRAM (TEE) ? ?  ? ?Patient location during evaluation: SICU ?Anesthesia Type: General ?Level of consciousness: sedated ?Pain management: pain level controlled ?Vital Signs Assessment: post-procedure vital signs reviewed and stable ?Respiratory status: patient remains intubated per anesthesia plan ?Cardiovascular status: stable ?Postop Assessment: no apparent nausea or vomiting ?Anesthetic complications: no ? ? ?No notable events documented. ? ?Last Vitals:  ?Vitals:  ? 04/07/22 0548 04/07/22 1344  ?BP: (!) 151/76   ?Pulse: 90 95  ?Resp: 18 12  ?Temp: 36.4 ?C   ?SpO2: 100%   ?  ?Last Pain:  ?Vitals:  ? 04/07/22 0630  ?TempSrc:   ?PainSc: 0-No pain  ? ? ?  ?  ?  ?  ?  ?  ? ?Effie Berkshire ? ? ? ? ?

## 2022-04-07 NOTE — Brief Op Note (Signed)
04/07/2022 ? ?11:50 AM ? ?PATIENT:  Patricia [name]  71 y.o. female ? ?PRE-OPERATIVE DIAGNOSIS:  Severe Aortic Insuffiency ? ?POST-OPERATIVE DIAGNOSIS:  Severe Aortic Insuffiency ? ?PROCEDURE:   ?AORTIC VALVE REPLACEMENT with a 19m Edwards Inspiris Resilia Bioprosthetic Valve ? ?TRANSESOPHAGEAL ECHOCARDIOGRAM  ? ?SURGEON: BGaye Pollack MD  ? ?PHYSICIAN ASSISTANT: Tydus Sanmiguel ? ?ASSISTANTS: Pelance, MBubba Hales RN, RN First Assistant  ? ?ANESTHESIA:   general ? ?EBL:  1538m? ?BLOOD ADMINISTERED:none ? ?DRAINS:  Mediastinal drains   ? ?LOCAL MEDICATIONS USED:  NONE ? ?SPECIMEN:  none ? ?DISPOSITION OF SPECIMEN:  N/A ? ?COUNTS:  Correct ? ?DICTATION: .Dragon Dictation ? ?PLAN OF CARE: Admit to inpatient  ? ?PATIENT DISPOSITION:  ICU - intubated and hemodynamically stable. ?  ?Delay start of Pharmacological VTE agent (>24hrs) due to surgical blood loss or risk of bleeding: yes ? ?

## 2022-04-07 NOTE — Anesthesia Procedure Notes (Addendum)
Arterial Line Insertion ?Start/End4/27/2023 7:15 AM, 04/07/2022 7:20 AM ?Performed by: Effie Berkshire, MD, anesthesiologist ? Preanesthetic checklist: patient identified, IV checked, site marked, risks and benefits discussed, surgical consent, monitors and equipment checked, pre-op evaluation, timeout performed and anesthesia consent ?Lidocaine 1% used for infiltration and patient sedated ?Right, radial was placed ?Catheter size: 20 G ?Hand hygiene performed  and maximum sterile barriers used  ?Allen's test indicative of satisfactory collateral circulation ?Attempts: 4 ?Procedure performed without using ultrasound guided technique. ?Ultrasound Notes:anatomy identified, needle tip was noted to be adjacent to the nerve/plexus identified and no ultrasound evidence of intravascular and/or intraneural injection ?Following insertion, dressing applied and Biopatch. ?Post procedure complications: second provider assisted and unsuccessful attempts. ?Patient tolerated the procedure well with no immediate complications. ?Additional procedure comments: MDA x1 attempt. ? ? ? ?

## 2022-04-07 NOTE — Progress Notes (Addendum)
Contacted [employer] for [employer] to be paged.  Information given for patient, bay number and procedure time.  ? ?0615  ?Received call from [employer] rep who stated he would be here 0715-0730 ?

## 2022-04-07 NOTE — Transfer of Care (Signed)
Immediate Anesthesia Transfer of Care Note ? ?Patient: Patricia [name] ? ?Procedure(s) Performed: AORTIC VALVE REPLACEMENT (AVR), USING INSPIRIS 19 MM AORTIC VALVE (Chest) ?TRANSESOPHAGEAL ECHOCARDIOGRAM (TEE) ? ?Patient Location: ICU ? ?Anesthesia Type:General ? ?Level of Consciousness: Patient remains intubated per anesthesia plan ? ?Airway & Oxygen Therapy: Patient remains intubated per anesthesia plan and Patient placed on Ventilator (see vital sign flow sheet for setting) ? ?Post-op Assessment: Report given to RN and Post -op Vital signs reviewed and stable ? ?Post vital signs: Reviewed and stable ? ?Last Vitals:  ?Vitals Value Taken Time  ?BP    ?Temp 36.6 ?C 04/07/22 1349  ?Pulse 95 04/07/22 1344  ?Resp 12 04/07/22 1349  ?SpO2    ?Vitals shown include unvalidated device data. ? ?Last Pain:  ?Vitals:  ? 04/07/22 0630  ?TempSrc:   ?PainSc: 0-No pain  ?   ? ?  ? ?Complications: No notable events documented. ?

## 2022-04-07 NOTE — Progress Notes (Addendum)
Dear Doctor: This patient has been identified as a candidate for PICC / CVC for the following reason (s): IV therapy over 48 hours, drug extravasation potential with tissue necrosis (KCL, Dilantin, Dopamine, CaCl, MgSO4, chemo vesicant), poor veins/poor circulatory system (CHF, COPD, emphysema, diabetes, steroid use, IV drug abuse, etc.), and incompatible drugs (aminophyllin, TPN, heparin, given with an antibiotic) If you agree, please write an order for the indicated device.   Thank you for supporting the early vascular access assessment program. 

## 2022-04-07 NOTE — Op Note (Signed)
CARDIOVASCULAR SURGERY OPERATIVE NOTE ? ?04/07/2022 ?Patricia [name] ?[id number] ? ?Surgeon:  [name], MD ? ?First Assistant: Enid Cutter,  PA-C : An experienced assistant was required given the complexity of this surgery and the standard of surgical care. The assistant was needed for exposure, dissection, suctioning, retraction of delicate tissues and sutures, instrument exchange and for overall help during this procedure.  ? ? ?Preoperative Diagnosis:  Severe aortic insufficiency ? ? ?Postoperative Diagnosis:  Same ? ? ?Procedure: ? ?Median Sternotomy ?Extracorporeal circulation ?3.   Aortic valve replacement using a 19 mm INSPIRIS RESILIA pericardial  valve.  BSA 1.58 ( Previous tissue mitral valve) ? ?Anesthesia:  General Endotracheal ? ? ?Clinical History/Surgical Indication: ? ?This 71 year old woman has stage D, severe, symptomatic aortic insufficiency with New York Heart Association class III symptoms of exertional fatigue and shortness of breath with low-level activity consistent with chronic diastolic congestive heart failure.  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  I have personally reviewed her TEE which shows a trileaflet aortic valve with thickened and retracted leaflets that do not coapt.  There is severe aortic insufficiency.  I suspect this is the same radiation etiology as her mitral valve and has progressively worsened over the years.  I agree that the best treatment for her is aortic valve replacement using a bovine pericardial bioprosthesis.  I do not think there is any indication for attempt at repair given the thickened and retracted leaflets with a mitral valve prosthesis in place. Cardiac catheterization in 2018 showed minimal irregularity in the left circumflex.  Gated cardiac CTA showed  no significant coronary disease.  CTA of the chest showed normal caliber of the ascending aorta without significant calcification.  I discussed the operative procedure with the patient  including alternatives, benefits and risks; including but not limited to bleeding, blood transfusion, infection, stroke, myocardial infarction, graft failure, heart block requiring a permanent pacemaker, organ dysfunction, and death.  [name] understands and agrees to proceed.   ? ?Preparation: ? ?The patient was seen in the preoperative holding area and the correct patient, correct operation were confirmed with the patient after reviewing the medical record and catheterization. The consent was signed by me. Preoperative antibiotics were given. A pulmonary arterial line and radial arterial line were placed by the anesthesia team. The patient was taken back to the operating room and positioned supine on the operating room table. After being placed under general endotracheal anesthesia by the anesthesia team a foley catheter was placed. The neck, chest, abdomen, and both legs were prepped with betadine soap and solution and draped in the usual sterile manner. A surgical time-out was taken and the correct patient and operative procedure were confirmed with the nursing and anesthesia staff. ? ? ?Pre-bypass TEE: ?  ?Complete TEE assessment was performed by Dr. Suella Broad. Aortic valve trileaflet with some thickening and retraction, immobile right coronary leaflet. Normal LV systolic function. The prosthetic mitral valve is functioning normally. ? ? ? ?Post-bypass TEE: ? ? ?Normal functioning prosthetic aortic valve with no perivalvular leak or regurgitation through the valve. Left ventricular function preserved. No prosthetic mitral regurgitation. ? ? ? ?Cardiopulmonary Bypass: ? ?A median sternotomy was performed. The pericardium was opened in the midline. The pericardial cavity was obliterated from her prior mini-MVR and  prior radiation to the chest.  Right ventricular function appeared normal. The anterior pericardium was mobilized to expose the ascending aorta which was of normal size and had no palpable plaque. There were no contraindications to aortic cannulation or cross-clamping. The right atrium was firmly adherent to the pericardium and the right lung and could not be mobilized very much. I decided it would be safer to do femoral venous cannulation. The right common femoral vein was localized with ultrasound and cannulated with a needle, J-wire and 7 Fr sheath. The patient was fully systemically heparinized and the ACT was maintained > 400 sec. The distal ascending aorta was cannulated with a 20 F aortic cannula for arterial inflow. Then a Meier wire was inserted through the femoral sheath and the tip confirmed in the SVC using TEE. The 7 Fr sheath was removed and the track dilated serially.  Venous cannulation was performed using a long 25 Fr Biomedicus multi-stage venous cannula passed over the Meier wire with TEE confirmation of position in the right atrium.  An antegrade cardioplegia/vent cannula was inserted into the mid-ascending aorta. A left ventricular vent could not be placed since I could not expose the pulmonary veins due to dense adhesions. I attempted to place a retrograde cardioplegia cannula in the coronary sinus but could not get it to enter the coronary sinus from the right atrium. Aortic occlusion was performed with a single cross-clamp. Systemic cooling to 32 degrees Centigrade and topical cooling of the heart with iced saline were used. Hyperkalemic antegrade cold KBC cardioplegia was used to induce diastolic arrest. Despite severe AI we had decent root pressure during cardioplegia administration with quick arrest.   ? ?Aortic Valve Replacement:  ? ?A transverse aortotomy was performed 1 cm above the take-off of the right coronary artery. The native valve was trileaflet with thickened and retracted  leaflets and no annular calcification. The ostia of the coronary arteries were in normal position and were not obstructed. The native valve leaflets were excised. Care was taken to remove all particulate debris. The left ventricle was directly inspected for debris and then irrigated with ice saline solution. The annulus was sized and a size 19 mm INSPIRIS RESILIA pericardial valve was chosen. The model number was [id number] and the serial number was [id number]. While the valve was being prepared 2-0 Ethibond pledgeted horizontal mattress sutures were placed around the annulus with the pledgets in a sub-annular position. The sutures were placed through the sewing ring and the valve lowered into place. The sutures were tied using CorKnots. The valve seated nicely and the coronary ostia were not obstructed. The prosthetic valve leaflets moved normally and there was no sub-valvular obstruction. The aortotomy was closed using 4-0 Prolene suture in 2 layers with felt strips to reinforce the closure. ? ?Completion: ? ?The patient was rewarmed to 37 degrees Centigrade. De-airing maneuvers were performed and the head placed in trendelenburg position. The crossclamp was removed with a time of 84 minutes. There was spontaneous return of sinus rhythm. The aortotomy was checked for hemostasis. Two temporary epicardial pacing wires were placed on the right atrium and two on the right ventricle.  The patient was weaned from CPB without difficulty on no inotropes. CPB time was 147 minutes. Heparin was fully reversed with protamine and the aortic and venous cannulas removed. Direct pressure on the right groin for venous hemostasis was applied for 30 minutes. Hemostasis was achieved. Mediastinal and right pleuraldrainage tubes were placed. The sternum was closed with #6 stainless steel wires. The fascia was closed with  continuous # 1 vicryl suture. The subcutaneous tissue was closed with 2-0 vicryl continuous suture. The skin was closed  with 3-0 vicryl subcuticular suture. All sponge, needle, and instrument counts were reported correct at the end of the case. Dry sterile dressings were placed over the incisions and around the chest tubes which we

## 2022-04-07 NOTE — Progress Notes (Signed)
Patient arrived to [facility] from [place].  Report received from CRNA.  Swan not present on admission.  Patient with Introducer and one PIV.  Unable to give IV potassium, Mag, or Pepcid due to minimal access and greater need for blood products through PIV (2 FFP/2 cryo).  IV team consulted for additional PIV access and were unable to obtain.  Dr. Cyndia Bent made aware of limited access and aware certain medications would not be given in timely manner. ?

## 2022-04-07 NOTE — Interval H&P Note (Signed)
History and Physical Interval Note: ? ?04/07/2022 ?7:12 AM ? ?Patricia [name]  has presented today for surgery, with the diagnosis of SEVERE AI.  The various methods of treatment have been discussed with the patient and family. After consideration of risks, benefits and other options for treatment, the patient has consented to  Procedure(s): ?AORTIC VALVE REPLACEMENT (AVR) (N/A) ?TRANSESOPHAGEAL ECHOCARDIOGRAM (TEE) (N/A) as a surgical intervention.  The patient's history has been reviewed, patient examined, no change in status, stable for surgery.  I have reviewed the patient's chart and labs.  Questions were answered to the patient's satisfaction.   ? ? ?[name] ? ? ?

## 2022-04-07 NOTE — Progress Notes (Signed)
?  Echocardiogram ?Echocardiogram Transesophageal has been performed. ? Patricia [name] ?04/07/2022, 9:31 AM ?

## 2022-04-07 NOTE — Progress Notes (Signed)
Remote pacemaker transmission.   

## 2022-04-07 NOTE — Progress Notes (Signed)
? ?   ?  [address] ?      [place] [address] ?            [id number]   ? ?  ?S/p AVR ? ?Intubated, sedated ? ?BP (!) 151/76   Pulse 84   Temp (!) 97.3 ?F (36.3 ?C)   Resp 12   Ht '5\' 6"'$  (1.676 m)   Wt 51.7 kg   SpO2 100%   BMI 18.40 kg/m?  ?CVP= 10 ?Bp variable ? ?Intake/Output Summary (Last 24 hours) at 04/07/2022 1823 ?Last data filed at 04/07/2022 1800 ?Gross per 24 hour  ?Intake 4319.01 ml  ?Output 2445 ml  ?Net 1874.01 ml  ? ?Moderate CT output ?Coagulopathy treated with FFP and cryo ? ?Hct =30, K= 3.6 ? ?Stable early postop, CT output down with correction of coagulopathy ? ?Revonda Standard Roxan Hockey, MD ?[facility] ?[phone] ? ?

## 2022-04-07 NOTE — Anesthesia Procedure Notes (Signed)
Procedure Name: Intubation ?Date/Time: 04/07/2022 8:08 AM ?Performed by: Glynda Jaeger, CRNA ?Pre-anesthesia Checklist: Patient identified, Patient being monitored, Timeout performed, Emergency Drugs available and Suction available ?Patient Re-evaluated:Patient Re-evaluated prior to induction ?Oxygen Delivery Method: Circle System Utilized ?Preoxygenation: Pre-oxygenation with 100% oxygen ?Induction Type: IV induction ?Ventilation: Mask ventilation without difficulty ?Laryngoscope Size: Mac, 4 and Miller ?Grade View: Grade II ?Tube type: Oral ?Tube size: 8.0 mm ?Number of attempts: 1 ?Airway Equipment and Method: Stylet ?Placement Confirmation: ETT inserted through vocal cords under direct vision, positive ETCO2 and breath sounds checked- equal and bilateral ?Secured at: 21 cm ?Tube secured with: Tape ?Dental Injury: Teeth and Oropharynx as per pre-operative assessment  ? ? ? ? ?

## 2022-04-07 NOTE — Progress Notes (Signed)
IV team received a consult to assess for placing vascular access. L arm is restricted. R arm has no appropriate vasculature for PIV placement. Recommend placing CVC or order for PICC line. Patricia Lowes RN VAST ?

## 2022-04-08 ENCOUNTER — Encounter (HOSPITAL_COMMUNITY): Payer: Self-pay | Admitting: Surgery

## 2022-04-08 ENCOUNTER — Inpatient Hospital Stay (HOSPITAL_COMMUNITY): Payer: Medicare PPO

## 2022-04-08 LAB — GLUCOSE, CAPILLARY
Glucose-Capillary: 103 mg/dL — ABNORMAL HIGH (ref 70–99)
Glucose-Capillary: 112 mg/dL — ABNORMAL HIGH (ref 70–99)
Glucose-Capillary: 117 mg/dL — ABNORMAL HIGH (ref 70–99)
Glucose-Capillary: 117 mg/dL — ABNORMAL HIGH (ref 70–99)
Glucose-Capillary: 127 mg/dL — ABNORMAL HIGH (ref 70–99)
Glucose-Capillary: 129 mg/dL — ABNORMAL HIGH (ref 70–99)
Glucose-Capillary: 131 mg/dL — ABNORMAL HIGH (ref 70–99)
Glucose-Capillary: 140 mg/dL — ABNORMAL HIGH (ref 70–99)
Glucose-Capillary: 190 mg/dL — ABNORMAL HIGH (ref 70–99)

## 2022-04-08 LAB — PREPARE CRYOPRECIPITATE
Unit division: 0
Unit division: 0

## 2022-04-08 LAB — CBC
HCT: 28.4 % — ABNORMAL LOW (ref 36.0–46.0)
Hemoglobin: 9.4 g/dL — ABNORMAL LOW (ref 12.0–15.0)
MCH: 28.1 pg (ref 26.0–34.0)
MCHC: 33.1 g/dL (ref 30.0–36.0)
MCV: 84.8 fL (ref 80.0–100.0)
Platelets: 148 10*3/uL — ABNORMAL LOW (ref 150–400)
RBC: 3.35 MIL/uL — ABNORMAL LOW (ref 3.87–5.11)
RDW: 17.7 % — ABNORMAL HIGH (ref 11.5–15.5)
WBC: 16.4 10*3/uL — ABNORMAL HIGH (ref 4.0–10.5)
nRBC: 0 % (ref 0.0–0.2)

## 2022-04-08 LAB — POCT I-STAT 7, (LYTES, BLD GAS, ICA,H+H)
Acid-Base Excess: 2 mmol/L (ref 0.0–2.0)
Acid-Base Excess: 0 mmol/L (ref 0.0–2.0)
Bicarbonate: 23.7 mmol/L (ref 20.0–28.0)
Bicarbonate: 24.8 mmol/L (ref 20.0–28.0)
Calcium, Ion: 1.05 mmol/L — ABNORMAL LOW (ref 1.15–1.40)
Calcium, Ion: 1.08 mmol/L — ABNORMAL LOW (ref 1.15–1.40)
HCT: 29 % — ABNORMAL LOW (ref 36.0–46.0)
HCT: 29 % — ABNORMAL LOW (ref 36.0–46.0)
Hemoglobin: 9.9 g/dL — ABNORMAL LOW (ref 12.0–15.0)
Hemoglobin: 9.9 g/dL — ABNORMAL LOW (ref 12.0–15.0)
O2 Saturation: 99 %
O2 Saturation: 100 %
Patient temperature: 37.5
Patient temperature: 38
Potassium: 3.7 mmol/L (ref 3.5–5.1)
Potassium: 3.7 mmol/L (ref 3.5–5.1)
Sodium: 132 mmol/L — ABNORMAL LOW (ref 135–145)
Sodium: 131 mmol/L — ABNORMAL LOW (ref 135–145)
TCO2: 25 mmol/L (ref 22–32)
TCO2: 26 mmol/L (ref 22–32)
pCO2 arterial: 29.2 mmHg — ABNORMAL LOW (ref 32–48)
pCO2 arterial: 43.6 mmHg (ref 32–48)
pH, Arterial: 7.52 — ABNORMAL HIGH (ref 7.35–7.45)
pH, Arterial: 7.368 (ref 7.35–7.45)
pO2, Arterial: 126 mmHg — ABNORMAL HIGH (ref 83–108)
pO2, Arterial: 192 mmHg — ABNORMAL HIGH (ref 83–108)

## 2022-04-08 LAB — BPAM RBC
Blood Product Expiration Date: 202305282359
Blood Product Expiration Date: 202305282359
Blood Product Expiration Date: 202305282359
ISSUE DATE / TIME: 202304270855
ISSUE DATE / TIME: 202304270855
ISSUE DATE / TIME: 202304272203
Unit Type and Rh: 5100
Unit Type and Rh: 5100
Unit Type and Rh: 5100

## 2022-04-08 LAB — CBC WITH DIFFERENTIAL/PLATELET
Abs Immature Granulocytes: 0.06 10*3/uL (ref 0.00–0.07)
Basophils Absolute: 0 10*3/uL (ref 0.0–0.1)
Basophils Relative: 0 %
Eosinophils Absolute: 0 10*3/uL (ref 0.0–0.5)
Eosinophils Relative: 0 %
HCT: 28.4 % — ABNORMAL LOW (ref 36.0–46.0)
Hemoglobin: 9.9 g/dL — ABNORMAL LOW (ref 12.0–15.0)
Immature Granulocytes: 0 %
Lymphocytes Relative: 4 %
Lymphs Abs: 0.6 10*3/uL — ABNORMAL LOW (ref 0.7–4.0)
MCH: 28.9 pg (ref 26.0–34.0)
MCHC: 34.9 g/dL (ref 30.0–36.0)
MCV: 82.8 fL (ref 80.0–100.0)
Monocytes Absolute: 2 10*3/uL — ABNORMAL HIGH (ref 0.1–1.0)
Monocytes Relative: 14 %
Neutro Abs: 11.4 10*3/uL — ABNORMAL HIGH (ref 1.7–7.7)
Neutrophils Relative %: 82 %
Platelets: 145 10*3/uL — ABNORMAL LOW (ref 150–400)
RBC: 3.43 MIL/uL — ABNORMAL LOW (ref 3.87–5.11)
RDW: 17 % — ABNORMAL HIGH (ref 11.5–15.5)
WBC: 14 10*3/uL — ABNORMAL HIGH (ref 4.0–10.5)
nRBC: 0 % (ref 0.0–0.2)

## 2022-04-08 LAB — BPAM FFP
Blood Product Expiration Date: 202304292359
Blood Product Expiration Date: 202304292359
ISSUE DATE / TIME: 202304271439
ISSUE DATE / TIME: 202304271440
Unit Type and Rh: 5100
Unit Type and Rh: 5100

## 2022-04-08 LAB — COMPREHENSIVE METABOLIC PANEL
ALT: 25 U/L (ref 0–44)
AST: 33 U/L (ref 15–41)
Albumin: 3.4 g/dL — ABNORMAL LOW (ref 3.5–5.0)
Alkaline Phosphatase: 40 U/L (ref 38–126)
Anion gap: 10 (ref 5–15)
BUN: 23 mg/dL (ref 8–23)
CO2: 23 mmol/L (ref 22–32)
Calcium: 7.6 mg/dL — ABNORMAL LOW (ref 8.9–10.3)
Chloride: 98 mmol/L (ref 98–111)
Creatinine, Ser: 1.41 mg/dL — ABNORMAL HIGH (ref 0.44–1.00)
GFR, Estimated: 40 mL/min — ABNORMAL LOW (ref >60)
Glucose, Bld: 119 mg/dL — ABNORMAL HIGH (ref 70–99)
Potassium: 3.6 mmol/L (ref 3.5–5.1)
Sodium: 131 mmol/L — ABNORMAL LOW (ref 135–145)
Total Bilirubin: 1.7 mg/dL — ABNORMAL HIGH (ref 0.3–1.2)
Total Protein: 5 g/dL — ABNORMAL LOW (ref 6.5–8.1)

## 2022-04-08 LAB — TYPE AND SCREEN
ABO/RH(D): O POS
Antibody Screen: NEGATIVE
Unit division: 0
Unit division: 0
Unit division: 0

## 2022-04-08 LAB — BPAM CRYOPRECIPITATE
Blood Product Expiration Date: 202304272115
Blood Product Expiration Date: 202304272115
ISSUE DATE / TIME: 202304271626
ISSUE DATE / TIME: 202304271626
Unit Type and Rh: 5100
Unit Type and Rh: 5100

## 2022-04-08 LAB — PREPARE FRESH FROZEN PLASMA: Unit division: 0

## 2022-04-08 LAB — BASIC METABOLIC PANEL
Anion gap: 9 (ref 5–15)
BUN: 27 mg/dL — ABNORMAL HIGH (ref 8–23)
CO2: 25 mmol/L (ref 22–32)
Calcium: 7.9 mg/dL — ABNORMAL LOW (ref 8.9–10.3)
Chloride: 97 mmol/L — ABNORMAL LOW (ref 98–111)
Creatinine, Ser: 1.79 mg/dL — ABNORMAL HIGH (ref 0.44–1.00)
GFR, Estimated: 30 mL/min — ABNORMAL LOW (ref >60)
Glucose, Bld: 110 mg/dL — ABNORMAL HIGH (ref 70–99)
Potassium: 4.4 mmol/L (ref 3.5–5.1)
Sodium: 131 mmol/L — ABNORMAL LOW (ref 135–145)

## 2022-04-08 LAB — MAGNESIUM
Magnesium: 3.5 mg/dL — ABNORMAL HIGH (ref 1.7–2.4)
Magnesium: 3.2 mg/dL — ABNORMAL HIGH (ref 1.7–2.4)

## 2022-04-08 LAB — SURGICAL PATHOLOGY

## 2022-04-08 MED ORDER — TRAMADOL HCL 50 MG PO TABS
50.0000 mg | ORAL_TABLET | ORAL | Status: DC | PRN
  Administered 2022-04-09: 50 mg via ORAL
  Filled 2022-04-08: qty 1

## 2022-04-08 MED ORDER — PANCRELIPASE (LIP-PROT-AMYL) 12000-38000 UNITS PO CPEP
72000.0000 [IU] | ORAL_CAPSULE | Freq: Every day | ORAL | Status: DC
  Administered 2022-04-10 – 2022-04-11 (×2): 36000 [IU] via ORAL
  Administered 2022-04-12: 72000 [IU] via ORAL
  Administered 2022-04-13 – 2022-04-14 (×2): 24000 [IU] via ORAL
  Filled 2022-04-08 (×2): qty 6
  Filled 2022-04-08 (×3): qty 2
  Filled 2022-04-08: qty 6

## 2022-04-08 MED ORDER — PANCRELIPASE (LIP-PROT-AMYL) 12000-38000 UNITS PO CPEP
36000.0000 [IU] | ORAL_CAPSULE | Freq: Two times a day (BID) | ORAL | Status: DC
  Administered 2022-04-08 – 2022-04-13 (×9): 36000 [IU] via ORAL
  Filled 2022-04-08: qty 3
  Filled 2022-04-08 (×2): qty 1
  Filled 2022-04-08: qty 3
  Filled 2022-04-08 (×5): qty 1
  Filled 2022-04-08: qty 3

## 2022-04-08 MED ORDER — INSULIN ASPART 100 UNIT/ML IJ SOLN
0.0000 [IU] | INTRAMUSCULAR | Status: DC
  Administered 2022-04-08: 4 [IU] via SUBCUTANEOUS
  Administered 2022-04-08 – 2022-04-09 (×2): 2 [IU] via SUBCUTANEOUS
  Administered 2022-04-09 (×2): 4 [IU] via SUBCUTANEOUS
  Administered 2022-04-09 – 2022-04-10 (×3): 2 [IU] via SUBCUTANEOUS
  Administered 2022-04-10: 4 [IU] via SUBCUTANEOUS
  Administered 2022-04-10 (×3): 2 [IU] via SUBCUTANEOUS
  Administered 2022-04-10: 8 [IU] via SUBCUTANEOUS

## 2022-04-08 MED ORDER — POTASSIUM CHLORIDE 10 MEQ/100ML IV SOLN
10.0000 meq | INTRAVENOUS | Status: AC
  Administered 2022-04-08 (×3): 10 meq via INTRAVENOUS
  Filled 2022-04-08 (×2): qty 100

## 2022-04-08 NOTE — Hospital Course (Addendum)
PCP is Susy Frizzle, MD ?Referring Provider is Minus Breeding, MD ?(Sent requests for appts on 4/28) ? ?History of Present Illness: ?This 71 year old woman has stage D, severe, symptomatic aortic insufficiency with New York Heart Association class III symptoms of exertional fatigue and shortness of [name] with low-level activity consistent with chronic diastolic congestive heart failure.  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  I have personally reviewed her TEE which shows a trileaflet aortic valve with thickened and retracted leaflets that do not coapt.  There is severe aortic insufficiency.  I suspect this is the same radiation etiology as her mitral valve and has progressively worsened over the years.  I agree that the best treatment for her is aortic valve replacement using a bovine pericardial bioprosthesis.  I do not think there is any indication for attempt at repair given the thickened and retracted leaflets with a mitral valve prosthesis in place. Cardiac catheterization in 2018 showed minimal irregularity in the left circumflex.  Gated cardiac CTA showed no significant coronary disease.  CTA of the chest showed normal caliber of the ascending aorta without significant calcification.  I discussed the operative procedure with the patient  including alternatives, benefits and risks; including but not limited to bleeding, blood transfusion, infection, stroke, myocardial infarction, graft failure, heart block requiring a permanent pacemaker, organ dysfunction, and death.  Patricia [name] understands and agrees to proceed.   ? ?Hospital Course: ?Patricia [name] was admitted for elective surgery on 04/07/2022.  She was taken to the operative room where aortic valve replacement was accomplished.  She had dense adhesions  of the pericardium, right lung, and cardiac structures from previous mitral valve surgery and radiation.  For this reason, right femoral venous cannulation was chosen.  A 19 mm Edwards Inspiris Resilia pericardial valve was placed.  Following the procedure, she separated from cardiopulmonary bypass without difficulty and requiring no inotropic support.  She was transferred to the surgical ICU in stable condition.  She remained hemodynamically stable.  Ventilator support was weaned and she was extubated at around 1 AM on the first postoperative day.  She was started back on metoprolol for hypertension and for rate control.  She was mobilized on the first postoperative day.  Glucose was managed with the insulin drip early postoperatively and she was later transitioned to sliding scale insulin coverage.  The patient had Atrial Flutter underneath per PPM.  She was started on Amiodarone and EP consult was requested.  They adjusted the patient's intrinsic PPM to assist with rate control.  She was started on coumadin for her old MVR and new AVR in place.  This will be continued for 3 months time.  She was hyponatremic and this improved without intervention.  She was volume overloaded and started on Demadex.  She was felt stable for transfer to the progressive care unit on 04/11/2022.  The patient remained paced.  She diuresed well with Demadex.  Her INR has not yet responded to coumadin.  It remains 1.1, she will be discharged home on 5 mg daily which was what she took in the past. PT evaluated the patient and recommended home PT which has been arranged.  She is ambulating without difficulty.  Her surgical incisions are healing without evidence of infection.  She is medically stable for discharge home today. ?

## 2022-04-08 NOTE — Care Management (Signed)
?  Transition of Care (TOC) Screening Note ? ? ?Patient Details  ?Name: Patricia [name] ?Date of Birth: July 12, 1951 ? ? ?Transition of Care (TOC) CM/SW Contact:    ?Graves-Bigelow, Ocie Cornfield, RN ?Phone Number: ?04/08/2022, 1:10 PM ? ? ? ?Transition of Care Department [facility]) has reviewed the patient and no TOC needs have been identified at this time. We will continue to monitor patient advancement through interdisciplinary progression rounds. If new patient transition needs arise, please place a TOC consult. ?  ?

## 2022-04-08 NOTE — Procedures (Signed)
Extubation Procedure Note ? ?Patient Details:   ?Name: Patricia [name] ?DOB: Feb 18, 1951 ?MRN: [id number] ?  ?Airway Documentation:  ?Airway 8 mm (Active)  ?Secured at (cm) 21 cm 04/07/22 2306  ?Measured From Lips 04/07/22 2306  ?Secured Location Right 04/07/22 2306  ?Secured By [employer] 04/07/22 2306  ?Cuff Pressure (cm H2O) Green OR 18-26 [facility] 04/07/22 1943  ?Site Condition Dry 04/07/22 1943  ? ?Vent end date: (not recorded) Vent end time: (not recorded)  ? ?Evaluation ? O2 sats: stable throughout ?Complications: No apparent complications ?Patient did tolerate procedure well. ?Bilateral Breath Sounds: Clear, Diminished ?  ?Yes ?Patient had a NIF of -30 and VC of 1.0. ?Extubated to 4L nasal canula. Oriented to time and place with good productive cough. IS use 450 ml times 4 ? ?Dimple Nanas ?04/08/2022, 1:01 AM ? ?

## 2022-04-08 NOTE — Discharge Summary (Addendum)
? ?   ?[address] ?      [place] [address] ?            [phone]   ? ?Physician Discharge Summary  ?Patient ID: ?Patricia [name] ?MRN: [id number] ?DOB/AGE: 1951/05/30 71 y.o. ? ?Admit date: 04/07/2022 ?Discharge date: 04/14/2022 ? ?Admission Diagnoses: ? ?Patient Active Problem List  ? Diagnosis Date Noted  ? Pacemaker 03/08/2022  ? CHB (complete heart block) ([place]) 12/27/2021  ? S/P MVR (mitral valve repair) 10/02/2019  ? Pneumonia   ? Hodgkin's lymphoma ([place])   ? GERD (gastroesophageal reflux disease)   ? COPD (chronic obstructive pulmonary disease) ([place])   ? Atrial flutter ([place])   ? Breast cancer ([place])   ? Bleeding gastric ulcer   ? Long term (current) use of anticoagulants [Z79.01] 09/25/2017  ? 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 Pulmonary HTN ([place]) 06/25/2017  ? Mitral valve stenosis 04/25/2017  ? Aortic valve regurgitation 04/25/2017  ? Osteoporosis   ? Pancreatitis   ? Prediabetes   ? Acute respiratory failure with hypoxia ([place]) 01/07/2015  ? Hodgkin lymphoma ([place]) 01/07/2015  ? Abdominal pain, epigastric   ? Diarrhea   ? AKI (acute kidney injury) ([place])   ? Hyponatremia   ? Acute pancreatitis 12/25/2014  ? Leukocytosis   ? Mitral regurgitation 06/07/2012  ? Syncope 04/26/2012  ? HTN (hypertension) 04/26/2012  ? Abnormal stress test 04/26/2012  ? Hyperkalemia 04/26/2012  ? Tachycardia 04/26/2012  ? ?Discharge Diagnoses:  ?Patient Active Problem List  ? Diagnosis Date Noted  ? 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/07/2022  ? Pacemaker 03/08/2022  ? CHB (complete heart block) ([place]) 12/27/2021  ? S/P MVR (mitral valve repair) 10/02/2019  ? Pneumonia   ? Hodgkin's lymphoma ([place])   ? GERD (gastroesophageal reflux disease)   ? COPD (chronic obstructive pulmonary disease) ([place])   ? Atrial flutter ([place])   ? Breast cancer ([place])   ? Bleeding gastric ulcer   ? Long term (current) use of anticoagulants [Z79.01] 09/25/2017  ? S/P minimally invasive mitral  valve replacement with bioprosthetic valve 09/13/2017  ? Pulmonary HTN ([place]) 06/25/2017  ? Mitral valve stenosis 04/25/2017  ? Aortic valve regurgitation 04/25/2017  ? Osteoporosis   ? Pancreatitis   ? Prediabetes   ? Acute respiratory failure with hypoxia ([place]) 01/07/2015  ? Hodgkin lymphoma ([place]) 01/07/2015  ? Abdominal pain, epigastric   ? Diarrhea   ? AKI (acute kidney injury) ([place])   ? Hyponatremia   ? Acute pancreatitis 12/25/2014  ? Leukocytosis   ? Mitral regurgitation 06/07/2012  ? Syncope 04/26/2012  ? HTN (hypertension) 04/26/2012  ? Abnormal stress test 04/26/2012  ? Hyperkalemia 04/26/2012  ? Tachycardia 04/26/2012  ? ?Discharged Condition: good ? ?PCP is Susy Frizzle, MD ?Referring Provider is Minus Breeding, MD ?(Sent requests for appts on 4/28) ? ?History of Present Illness: ?This 71 year old woman has stage D, severe, symptomatic aortic insufficiency with [place] Heart Association class III symptoms of exertional fatigue and shortness of [name] with low-level activity consistent with chronic diastolic congestive heart failure.  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  I have personally reviewed her TEE which shows a trileaflet aortic valve with thickened and retracted leaflets that do not coapt.  There is severe aortic insufficiency.  I  suspect this is the same radiation etiology as her mitral valve and has progressively worsened over the years.  I agree that the best treatment for her is aortic valve replacement using a bovine pericardial bioprosthesis.  I do not think there is any indication for attempt at repair given the thickened and retracted leaflets with a mitral valve prosthesis in place. Cardiac catheterization in 2018 showed minimal irregularity in the left circumflex.  Gated cardiac CTA showed  no significant coronary disease.  CTA of the chest showed normal caliber of the ascending aorta without significant calcification.  I discussed the operative procedure with the patient  including alternatives, benefits and risks; including but not limited to bleeding, blood transfusion, infection, stroke, myocardial infarction, graft failure, heart block requiring a permanent pacemaker, organ dysfunction, and death.  Patricia [name] understands and agrees to proceed.   ? ?Hospital Course: ?[name] was admitted for elective surgery on 04/07/2022.  She was taken to the operative room where aortic valve replacement was accomplished.  She had dense adhesions of the pericardium, right lung, and cardiac structures from previous mitral valve surgery and radiation.  For this reason, right femoral venous cannulation was chosen.  A 19 mm Edwards Inspiris Resilia pericardial valve was placed.  Following the procedure, she separated from cardiopulmonary bypass without difficulty and requiring no inotropic support.  She was transferred to the surgical ICU in stable condition.  She remained hemodynamically stable.  Ventilator support was weaned and she was extubated at around 1 AM on the first postoperative day.  She was started back on metoprolol for hypertension and for rate control.  She was mobilized on the first postoperative day.  Glucose was managed with the insulin drip early postoperatively and she was later transitioned to sliding scale insulin coverage.  The patient had Atrial Flutter underneath per PPM.  She was started on Amiodarone and EP consult was requested.  They adjusted the patient's intrinsic PPM to assist with rate control.  She was started on coumadin for her old MVR and new AVR in place.  This will be continued for 3 months time.  She was hyponatremic and this improved without intervention.  She was volume overloaded and started on Demadex.  She was felt stable for transfer to the progressive care  unit on 04/11/2022.  The patient remained paced.  She diuresed well with Demadex.  Her INR has not yet responded to coumadin.  It remains 1.1, she will be discharged home on 5 mg daily which was what she took in the past. PT evaluated the patient and recommended home PT which has been arranged.  She is ambulating without difficulty.  Her surgical incisions are healing without evidence of infection.  She is medically stable for discharge home today. ? ?Consults: cardiology ? ?Significant Diagnostic Studies: cardiac graphics:  ? ?Echocardiogram:   ?IMPRESSIONS  ? ? 1. There is moderate to severe AI present. Best seen in the PLAX.  ?Difficult to assess due to turbulent flow from MV prosthesis into the LV.  ?Mechanism possibly related to damage to the aorto-mitral continuity from  ?MV prosthesis (did not have significant  ?AI prior to MV surgery). PHT ~220 msec. There is concerns for  ?holodiastolic flow reveral in the descending aorta. TEE is recommended for  ?clarification. The aortic valve is tricuspid. Aortic valve regurgitation  ?is moderate to severe.  ? 2. Left ventricular ejection fraction, by estimation, is 55 to 60%. The  ?left ventricle has normal function. Left ventricular endocardial  border  ?not optimally defined to evaluate regional wall motion. Left ventricular  ?diastolic function could not be  ?evaluated.  ? 3. Right ventricular systolic function is mildly reduced. The right  ?ventricular size is mildly enlarged. There is moderately elevated  ?pulmonary artery systolic pressure. The estimated right ventricular  ?systolic pressure is 43.6 mmHg.  ? 4. Right atrial size was mildly dilated.  ? 5. 25 mm Medtronic mosaic valve in the mitral position. MG 6 mmHG @ 91  ?bpm. Emax 1.8 m/s, EOA 1.68 cm2, PHT 57 msec. All consistent with normal  ?prosthetic valve function. The mitral valve has been repaired/replaced. No  ?evidence of mitral valve  ?regurgitation. There is a 25 mm Medtronic Mosaic Porcine present in  the  ?mitral position. Procedure Date: 09/13/2017.  ? 6. Tricuspid valve regurgitation is mild to moderate.  ? 7. Pulmonic valve regurgitation is moderate.  ? 8. The inferior vena cava is dilated in size

## 2022-04-08 NOTE — Discharge Instructions (Addendum)
Discharge Instructions: ? ?1. You may shower, please wash incisions daily with soap and water and keep dry.  If you wish to cover wounds with dressing you may do so but please keep clean and change daily.  No tub baths or swimming until incisions have completely healed.  If your incisions become red or develop any drainage please call our office at [phone] ? ?2. No Driving until cleared by Dr. Vivi Martens office and you are no longer using narcotic pain medications ? ?3. Monitor your weight daily.. Please use the same scale and weigh at same time... If you gain 5-10 lbs in 48 hours with associated lower extremity swelling, please contact our office at [phone] ? ?4. Fever of 101.5 for at least 24 hours with no source, please contact our office at [phone] ? ?5. Activity- up as tolerated, please walk at least 3 times per day.  Avoid strenuous activity, no lifting, pushing, or pulling with your arms over 8-10 lbs for a minimum of 6 weeks ? ?6. If any questions or concerns arise, please do not hesitate to contact our office at [phone] ? ? ?Information on my medicine - Coumadin?   (Warfarin) ? ?This medication education was reviewed with me or my healthcare representative as part of my discharge preparation.  The pharmacist that spoke with me during my hospital stay was:  Pat Patrick, [facility] ? ?Why was Coumadin prescribed for you? ?Coumadin was prescribed for you because you have a blood clot or a medical condition that can cause an increased risk of forming blood clots. Blood clots can cause serious health problems by blocking the flow of blood to the heart, lung, or brain. Coumadin can prevent harmful blood clots from forming. ?As a reminder your indication for Coumadin is:  Blood Clot Prevention after Heart Valve Surgery ? ?What test will check on my response to Coumadin? ?While on Coumadin (warfarin) you will need to have an INR test regularly to ensure that your dose is keeping you in the desired  range. The INR (international normalized ratio) number is calculated from the result of the laboratory test called prothrombin time (PT). ? ?If an INR APPOINTMENT HAS NOT ALREADY BEEN MADE FOR YOU please schedule an appointment to have this lab work done by your health care provider within 7 days. ?Your INR goal is usually a number between:  2 to 3 or your provider may give you a more narrow range like 2-2.5.  Ask your health care provider during an office visit what your goal INR is. ? ?What  do you need to  know  About  COUMADIN? ?Take Coumadin (warfarin) exactly as prescribed by your healthcare provider about the same time each day.  DO NOT stop taking without talking to the doctor who prescribed the medication.  Stopping without other blood clot prevention medication to take the place of Coumadin may increase your risk of developing a new clot or stroke.  Get refills before you run out. ? ?What do you do if you miss a dose? ?If you miss a dose, take it as soon as you remember on the same day then continue your regularly scheduled regimen the next day.  Do not take two doses of Coumadin at the same time. ? ?Important Safety Information ?A possible side effect of Coumadin (Warfarin) is an increased risk of bleeding. You should call your healthcare provider right away if you experience any of the following: ?Bleeding from an injury or your nose that does not  stop. ?Unusual colored urine (red or dark brown) or unusual colored stools (red or black). ?Unusual bruising for unknown reasons. ?A serious fall or if you hit your head (even if there is no bleeding). ? ?Some foods or medicines interact with Coumadin? (warfarin) and might alter your response to warfarin. To help avoid this: ?Eat a balanced diet, maintaining a consistent amount of Vitamin K. ?Notify your provider about major diet changes you plan to make. ?Avoid alcohol or limit your intake to 1 drink for women and 2 drinks for men per day. ?(1 drink is 5 oz.  wine, 12 oz. beer, or 1.5 oz. liquor.) ? ?Make sure that ANY health care provider who prescribes medication for you knows that you are taking Coumadin (warfarin).  Also make sure the healthcare provider who is monitoring your Coumadin knows when you have started a new medication including herbals and non-prescription products. ? ?Coumadin? (Warfarin)  Major Drug Interactions  ?Increased Warfarin Effect Decreased Warfarin Effect  ?Alcohol (large quantities) ?Antibiotics (esp. Septra/Bactrim, Flagyl, Cipro) ?Amiodarone (Cordarone) ?Aspirin (ASA) ?Cimetidine (Tagamet) ?Megestrol (Megace) ?NSAIDs (ibuprofen, naproxen, etc.) ?Piroxicam (Feldene) ?Propafenone (Rythmol SR) ?Propranolol (Inderal) ?Isoniazid (INH) ?Posaconazole (Noxafil) Barbiturates (Phenobarbital) ?Carbamazepine (Tegretol) ?Chlordiazepoxide (Librium) ?Cholestyramine (Questran) ?Griseofulvin ?Oral Contraceptives ?Rifampin ?Sucralfate (Carafate) ?Vitamin K  ? ?Coumadin? (Warfarin) Major Herbal Interactions  ?Increased Warfarin Effect Decreased Warfarin Effect  ?Garlic ?Ginseng ?Ginkgo biloba Coenzyme Q10 ?Green tea ?St. John?s wort   ? ?Coumadin? (Warfarin) FOOD Interactions  ?Eat a consistent number of servings per week of foods HIGH in Vitamin K ?(1 serving = ? cup)  ?Collards (cooked, or boiled & drained) ?Kale (cooked, or boiled & drained) ?Mustard greens (cooked, or boiled & drained) ?Parsley *serving size only = ? cup ?Spinach (cooked, or boiled & drained) ?Swiss chard (cooked, or boiled & drained) ?Turnip greens (cooked, or boiled & drained)  ?Eat a consistent number of servings per week of foods MEDIUM-HIGH in Vitamin K ?(1 serving = 1 cup)  ?Asparagus (cooked, or boiled & drained) ?Broccoli (cooked, boiled & drained, or raw & chopped) ?Brussel sprouts (cooked, or boiled & drained) *serving size only = ? cup ?Lettuce, raw (green leaf, endive, romaine) ?Spinach, raw ?Turnip greens, raw & chopped  ? ?These websites have more information on Coumadin  (warfarin):  FailFactory[employer]se; ?www.https://www.atkinson.net/; ?  ? ? ? ?

## 2022-04-08 NOTE — Progress Notes (Signed)
1 Day Post-Op Procedure(s) (LRB): ?AORTIC VALVE REPLACEMENT (AVR), USING INSPIRIS 19 MM AORTIC VALVE (N/A) ?TRANSESOPHAGEAL ECHOCARDIOGRAM (TEE) (N/A) ?Subjective: ?Some soreness but otherwise feels ok ? ?Objective: ?Vital signs in last 24 hours: ?Temp:  [96.3 ?F (35.7 ?C)-100 ?F (37.8 ?C)] 99 ?F (37.2 ?C) (04/28 0700) ?Pulse Rate:  [84-95] 86 (04/28 0700) ?Resp:  [11-24] 19 (04/28 0700) ?SpO2:  [98 %-100 %] 99 % (04/28 0700) ?Arterial Line BP: (71-149)/(39-78) 103/43 (04/28 0700) ?FiO2 (%):  [40 %-50 %] 40 % (04/28 0036) ?Weight:  [60 kg] 60 kg (04/28 0500) ? ?Hemodynamic parameters for last 24 hours: ?CVP:  [8 mmHg-18 mmHg] 9 mmHg ? ?Intake/Output from previous day: ?04/27 0701 - 04/28 0700 ?In: 5724.6 [I.V.:2489.1; Blood:2149.3; IV Piggyback:1086.1] ?Out: 3790 [Urine:1640; Blood:800; Chest Tube:750] ?Intake/Output this shift: ?No intake/output data recorded. ? ?General appearance: alert and cooperative ?Neurologic: intact ?Heart: regular rate and rhythm, S1, S2 normal, no murmur ?Lungs: clear to auscultation bilaterally ?Extremities: edema mild ?Wound: dressing dry. Right groin cannulation site ok. ? ?Lab Results: ?Recent Labs  ?  04/07/22 ?1953 04/07/22 ?1959 04/08/22 ?0056 04/08/22 ?0229  ?WBC 13.6*  --   --  14.0*  ?HGB 8.4*   < > 9.9* 9.9*  9.9*  ?HCT 23.9*   < > 29.0* 28.4*  29.0*  ?PLT 135*  --   --  145*  ? < > = values in this interval not displayed.  ? ?BMET:  ?Recent Labs  ?  04/07/22 ?1953 04/07/22 ?1959 04/08/22 ?0056 04/08/22 ?0229  ?NA 132*   < > 132* 131*  131*  ?K 3.4*   < > 3.7 3.6  3.7  ?CL 99  --   --  98  ?CO2 25  --   --  23  ?GLUCOSE 112*  --   --  119*  ?BUN 21  --   --  23  ?CREATININE 1.18*  --   --  1.41*  ?CALCIUM 7.7*  --   --  7.6*  ? < > = values in this interval not displayed.  ?  ?PT/INR:  ?Recent Labs  ?  04/07/22 ?1355  ?LABPROT 20.0*  ?INR 1.7*  ? ?ABG ?   ?Component Value Date/Time  ? PHART 7.368 04/08/2022 0229  ? HCO3 24.8 04/08/2022 0229  ? TCO2 26 04/08/2022 0229  ?  ACIDBASEDEF 2.0 04/07/2022 0947  ? O2SAT 100 04/08/2022 0229  ? ?CBG (last 3)  ?Recent Labs  ?  04/08/22 ?0227 04/08/22 ?2409 04/08/22 ?7353  ?GLUCAP 131* 103* 112*  ? ?CXR: no ptx. Chronic changes seen preop ? ?ECG: atrial sensed, V-paced rhythm. ? ?Assessment/Plan: ?S/P Procedure(s) (LRB): ?AORTIC VALVE REPLACEMENT (AVR), USING INSPIRIS 19 MM AORTIC VALVE (N/A) ?TRANSESOPHAGEAL ECHOCARDIOGRAM (TEE) (N/A) ? ?POD 1 ?Hemodynamically stable in internally paced rhythm. Continue Lopressor for HTN and sinus rate control. ? ?Postop coagulopathy corrected. Will keep chest tubes in this am and get up to chair. Possibly remove later today. ? ?DM: Hgb A1c 6.8 preop on no meds. Will transition to [place]. ? ?IS, OOB ? ?DC arterial line. ? ?Slight creat bump postop. Will hold off on diuresis today. ? ? LOS: 1 day  ? ? ?Patricia [name] ?04/08/2022 ? ? ?

## 2022-04-08 NOTE — Progress Notes (Signed)
EVENING ROUNDS NOTE : ? ?   ?[address] ?      [place] [address] ?            [phone]   ?              ?1 Day Post-Op ?Procedure(s) (LRB): ?AORTIC VALVE REPLACEMENT (AVR), USING INSPIRIS 19 MM AORTIC VALVE (N/A) ?TRANSESOPHAGEAL ECHOCARDIOGRAM (TEE) (N/A) ? ? ?Total Length of Stay:  LOS: 1 day  ?Events:   ?No events ?Resting comfortably ? ? ? ?BP (!) 101/52   Pulse 94   Temp 98 ?F (36.7 ?C) (Oral)   Resp (!) 30   Ht '5\' 6"'$  (1.676 m)   Wt 60 kg   SpO2 100%   BMI 21.35 kg/m?  ? ?CVP:  [5 mmHg-15 mmHg] 7 mmHg ? ?Vent Mode: CPAP;PSV ?FiO2 (%):  [40 %-50 %] 40 % ?Set Rate:  [4 bmp-12 bmp] 4 bmp ?Vt Set:  [470 mL] 470 mL ?PEEP:  [5 cmH20] 5 cmH20 ?Pressure Support:  [10 cmH20] 10 cmH20 ?Plateau Pressure:  [19 cmH20] 19 cmH20 ? ? sodium chloride Stopped (04/07/22 1934)  ? sodium chloride 250 mL (04/08/22 0600)  ? sodium chloride Stopped (04/07/22 1345)  ? lactated ringers    ? lactated ringers Stopped (04/08/22 1634)  ? nitroGLYCERIN    ? norepinephrine (LEVOPHED) Adult infusion Stopped (04/08/22 0829)  ? phenylephrine (NEO-SYNEPHRINE) Adult infusion    ? ? ?I/O last 3 completed shifts: ?In: 5724.6 [I.V.:2489.1; Blood:2149.3; IV Piggyback:1086.1] ?Out: 0865 [Urine:1640; Blood:800; Chest Tube:750] ? ? ? ?  Latest Ref Rng & Units 04/08/2022  ?  4:41 PM 04/08/2022  ?  2:29 AM 04/08/2022  ? 12:56 AM  ?CBC  ?WBC 4.0 - 10.5 K/uL 16.4   14.0     ?Hemoglobin 12.0 - 15.0 g/dL 9.4   9.9    ? 9.9   9.9    ?Hematocrit 36.0 - 46.0 % 28.4   28.4    ? 29.0   29.0    ?Platelets 150 - 400 K/uL 148   145     ? ? ? ?  Latest Ref Rng & Units 04/08/2022  ?  2:29 AM 04/08/2022  ? 12:56 AM 04/07/2022  ?  7:59 PM  ?BMP  ?Glucose 70 - 99 mg/dL 119      ?BUN 8 - 23 mg/dL 23      ?Creatinine 0.44 - 1.00 mg/dL 1.41      ?Sodium 135 - 145 mmol/L 131    ? 131   132   133    ?Potassium 3.5 - 5.1 mmol/L 3.6    ? 3.7   3.7   3.4    ?Chloride 98 - 111 mmol/L 98      ?CO2 22 - 32 mmol/L 23      ?Calcium 8.9 - 10.3 mg/dL 7.6       ? ? ?ABG ?   ?Component Value Date/Time  ? PHART 7.368 04/08/2022 0229  ? PCO2ART 43.6 04/08/2022 0229  ? PO2ART 192 (H) 04/08/2022 0229  ? HCO3 24.8 04/08/2022 0229  ? TCO2 26 04/08/2022 0229  ? ACIDBASEDEF 2.0 04/07/2022 0947  ? O2SAT 100 04/08/2022 0229  ? ? ? ? ? ?Melodie Bouillon, MD ?04/08/2022 5:46 PM ? ? ?

## 2022-04-09 ENCOUNTER — Inpatient Hospital Stay (HOSPITAL_COMMUNITY): Payer: Medicare PPO

## 2022-04-09 LAB — CBC
HCT: 28 % — ABNORMAL LOW (ref 36.0–46.0)
Hemoglobin: 9.2 g/dL — ABNORMAL LOW (ref 12.0–15.0)
MCH: 28 pg (ref 26.0–34.0)
MCHC: 32.9 g/dL (ref 30.0–36.0)
MCV: 85.4 fL (ref 80.0–100.0)
Platelets: 148 10*3/uL — ABNORMAL LOW (ref 150–400)
RBC: 3.28 MIL/uL — ABNORMAL LOW (ref 3.87–5.11)
RDW: 17.6 % — ABNORMAL HIGH (ref 11.5–15.5)
WBC: 15.7 10*3/uL — ABNORMAL HIGH (ref 4.0–10.5)
nRBC: 0 % (ref 0.0–0.2)

## 2022-04-09 LAB — BASIC METABOLIC PANEL
Anion gap: 8 (ref 5–15)
BUN: 29 mg/dL — ABNORMAL HIGH (ref 8–23)
CO2: 25 mmol/L (ref 22–32)
Calcium: 8 mg/dL — ABNORMAL LOW (ref 8.9–10.3)
Chloride: 96 mmol/L — ABNORMAL LOW (ref 98–111)
Creatinine, Ser: 1.76 mg/dL — ABNORMAL HIGH (ref 0.44–1.00)
GFR, Estimated: 31 mL/min — ABNORMAL LOW (ref >60)
Glucose, Bld: 135 mg/dL — ABNORMAL HIGH (ref 70–99)
Potassium: 4.2 mmol/L (ref 3.5–5.1)
Sodium: 129 mmol/L — ABNORMAL LOW (ref 135–145)

## 2022-04-09 LAB — GLUCOSE, CAPILLARY
Glucose-Capillary: 116 mg/dL — ABNORMAL HIGH (ref 70–99)
Glucose-Capillary: 120 mg/dL — ABNORMAL HIGH (ref 70–99)
Glucose-Capillary: 142 mg/dL — ABNORMAL HIGH (ref 70–99)
Glucose-Capillary: 144 mg/dL — ABNORMAL HIGH (ref 70–99)
Glucose-Capillary: 162 mg/dL — ABNORMAL HIGH (ref 70–99)
Glucose-Capillary: 165 mg/dL — ABNORMAL HIGH (ref 70–99)
Glucose-Capillary: 178 mg/dL — ABNORMAL HIGH (ref 70–99)

## 2022-04-09 MED ORDER — METOPROLOL TARTRATE 25 MG PO TABS
25.0000 mg | ORAL_TABLET | Freq: Two times a day (BID) | ORAL | Status: DC
  Administered 2022-04-09 – 2022-04-14 (×10): 25 mg via ORAL
  Filled 2022-04-09 (×11): qty 1

## 2022-04-09 NOTE — Progress Notes (Signed)
? ?   ?[address] ?      [place] [address] ?            [phone]   ?              ?2 Days Post-Op ?Procedure(s) (LRB): ?AORTIC VALVE REPLACEMENT (AVR), USING INSPIRIS 19 MM AORTIC VALVE (N/A) ?TRANSESOPHAGEAL ECHOCARDIOGRAM (TEE) (N/A) ? ? ?Events: ?No events ?Ambulated today ?Feels short of breath ?_______________________________________________________________ ?Vitals: ?BP (!) 107/53 (BP Location: Right Arm)   Pulse (!) 116   Temp 98.3 ?F (36.8 ?C) (Oral)   Resp (!) 22   Ht '5\' 6"'$  (1.676 m)   Wt 58.8 kg   SpO2 94%   BMI 20.93 kg/m?  ?Filed Weights  ? 04/07/22 0548 04/08/22 0500 04/09/22 0500  ?Weight: 51.7 kg 60 kg 58.8 kg  ? ? ? ?- Neuro: alert NAD ? ?- Cardiovascular: paced at 120.  Intrinsic pacer ? Drips: none.   ?CVP:  [5 mmHg-8 mmHg] 7 mmHg ? ?- Pulm: EWOB ?  ? ?ABG ?   ?Component Value Date/Time  ? PHART 7.368 04/08/2022 0229  ? PCO2ART 43.6 04/08/2022 0229  ? PO2ART 192 (H) 04/08/2022 0229  ? HCO3 24.8 04/08/2022 0229  ? TCO2 26 04/08/2022 0229  ? ACIDBASEDEF 2.0 04/07/2022 0947  ? O2SAT 100 04/08/2022 0229  ? ? ?- Abd: ND ?- Extremity: warm ? ?Marland KitchenIntake/Output   ?   04/28 0701 ?04/29 0700 04/29 0701 ?04/30 0700  ? P.O. 240   ? I.V. (mL/kg) 193.3 (3.3)   ? Blood    ? IV Piggyback 293.5   ? Total Intake(mL/kg) 726.8 (12.4)   ? Urine (mL/kg/hr) 715 (0.5)   ? Blood    ? Chest Tube 360   ? Total Output 1075   ? Net -348.2   ?     ?  ? ? ?_______________________________________________________________ ?Labs: ? ?  Latest Ref Rng & Units 04/09/2022  ?  3:10 AM 04/08/2022  ?  4:41 PM 04/08/2022  ?  2:29 AM  ?CBC  ?WBC 4.0 - 10.5 K/uL 15.7   16.4   14.0    ?Hemoglobin 12.0 - 15.0 g/dL 9.2   9.4   9.9    ? 9.9    ?Hematocrit 36.0 - 46.0 % 28.0   28.4   28.4    ? 29.0    ?Platelets 150 - 400 K/uL 148   148   145    ? ? ?  Latest Ref Rng & Units 04/09/2022  ?  3:10 AM 04/08/2022  ?  4:41 PM 04/08/2022  ?  2:29 AM  ?CMP  ?Glucose 70 - 99 mg/dL 135   110   119    ?BUN 8 - 23 mg/dL '29   27   23     '$ ?Creatinine 0.44 - 1.00 mg/dL 1.76   1.79   1.41    ?Sodium 135 - 145 mmol/L 129   131   131    ? 131    ?Potassium 3.5 - 5.1 mmol/L 4.2   4.4   3.6    ? 3.7    ?Chloride 98 - 111 mmol/L 96   97   98    ?CO2 22 - 32 mmol/L '25   25   23    '$ ?Calcium 8.9 - 10.3 mg/dL 8.0   7.9   7.6    ?Total Protein 6.5 - 8.1 g/dL   5.0    ?Total Bilirubin 0.3 -  1.2 mg/dL   1.7    ?Alkaline Phos 38 - 126 U/L   40    ?AST 15 - 41 U/L   33    ?ALT 0 - 44 U/L   25    ? ? ?CXR: ?clear ? ?_______________________________________________________________ ? ?Assessment and Plan: ?POD 2 s/p bAVR ? ?Neuro: pain controlled ?CV: PPM pacing at 120.  Increasing BB.  Will curbside EP ?Pulm: pulm hyg ?Renal: creat plateau.  Will continue to watch ?GI: on diet ?Heme: stable ?ID: afebrile, on levaquin ?Endo: SSI ?Dispo: continue ICU care for HR ? ? ?Patricia [name] Patricia [name] ?04/09/2022 9:58 AM ? ? ?

## 2022-04-10 ENCOUNTER — Inpatient Hospital Stay (HOSPITAL_COMMUNITY): Payer: Medicare PPO

## 2022-04-10 DIAGNOSIS — R000 Tachycardia, unspecified: Secondary | ICD-10-CM | POA: Diagnosis not present

## 2022-04-10 LAB — BASIC METABOLIC PANEL
Anion gap: 7 (ref 5–15)
BUN: 31 mg/dL — ABNORMAL HIGH (ref 8–23)
CO2: 27 mmol/L (ref 22–32)
Calcium: 8.1 mg/dL — ABNORMAL LOW (ref 8.9–10.3)
Chloride: 91 mmol/L — ABNORMAL LOW (ref 98–111)
Creatinine, Ser: 1.49 mg/dL — ABNORMAL HIGH (ref 0.44–1.00)
GFR, Estimated: 37 mL/min — ABNORMAL LOW (ref >60)
Glucose, Bld: 105 mg/dL — ABNORMAL HIGH (ref 70–99)
Potassium: 4.1 mmol/L (ref 3.5–5.1)
Sodium: 125 mmol/L — ABNORMAL LOW (ref 135–145)

## 2022-04-10 LAB — CBC
HCT: 27.9 % — ABNORMAL LOW (ref 36.0–46.0)
Hemoglobin: 9.2 g/dL — ABNORMAL LOW (ref 12.0–15.0)
MCH: 27.9 pg (ref 26.0–34.0)
MCHC: 33 g/dL (ref 30.0–36.0)
MCV: 84.5 fL (ref 80.0–100.0)
Platelets: 150 10*3/uL (ref 150–400)
RBC: 3.3 MIL/uL — ABNORMAL LOW (ref 3.87–5.11)
RDW: 17.4 % — ABNORMAL HIGH (ref 11.5–15.5)
WBC: 17 10*3/uL — ABNORMAL HIGH (ref 4.0–10.5)
nRBC: 0.1 % (ref 0.0–0.2)

## 2022-04-10 LAB — GLUCOSE, CAPILLARY
Glucose-Capillary: 115 mg/dL — ABNORMAL HIGH (ref 70–99)
Glucose-Capillary: 129 mg/dL — ABNORMAL HIGH (ref 70–99)
Glucose-Capillary: 138 mg/dL — ABNORMAL HIGH (ref 70–99)
Glucose-Capillary: 156 mg/dL — ABNORMAL HIGH (ref 70–99)
Glucose-Capillary: 182 mg/dL — ABNORMAL HIGH (ref 70–99)
Glucose-Capillary: 216 mg/dL — ABNORMAL HIGH (ref 70–99)

## 2022-04-10 MED ORDER — AMIODARONE LOAD VIA INFUSION
150.0000 mg | Freq: Once | INTRAVENOUS | Status: AC
Start: 2022-04-10 — End: 2022-04-10
  Administered 2022-04-10: 150 mg via INTRAVENOUS
  Filled 2022-04-10: qty 83.34

## 2022-04-10 MED ORDER — AMIODARONE HCL IN DEXTROSE 360-4.14 MG/200ML-% IV SOLN
30.0000 mg/h | INTRAVENOUS | Status: DC
  Administered 2022-04-10: 30 mg/h via INTRAVENOUS
  Filled 2022-04-10: qty 200

## 2022-04-10 MED ORDER — AMIODARONE HCL IN DEXTROSE 360-4.14 MG/200ML-% IV SOLN
60.0000 mg/h | INTRAVENOUS | Status: AC
Start: 2022-04-10 — End: 2022-04-10
  Administered 2022-04-10 (×2): 60 mg/h via INTRAVENOUS
  Filled 2022-04-10 (×2): qty 200

## 2022-04-10 NOTE — Progress Notes (Addendum)
EVENING ROUNDS NOTE : ? ?   ?[address] ?      [place] [address] ?            [phone]   ?              ?3 Days Post-Op ?Procedure(s) (LRB): ?AORTIC VALVE REPLACEMENT (AVR), USING INSPIRIS 19 MM AORTIC VALVE (N/A) ?TRANSESOPHAGEAL ECHOCARDIOGRAM (TEE) (N/A) ? ? ?Total Length of Stay:  LOS: 3 days  ?Events:   ?HR improved ?Appreciate EP ?Floor in am ? ? ? ?BP (!) 106/59 (BP Location: Right Arm)   Pulse 95   Temp 98.6 ?F (37 ?C) (Oral)   Resp 20   Ht '5\' 6"'$  (1.676 m)   Wt 60.7 kg   SpO2 93%   BMI 21.60 kg/m?  ? ?  ? ?  ? ? sodium chloride Stopped (04/07/22 1934)  ? sodium chloride 250 mL (04/08/22 0600)  ? sodium chloride Stopped (04/07/22 1345)  ? amiodarone 30 mg/hr (04/10/22 1615)  ? lactated ringers    ? lactated ringers Stopped (04/08/22 1634)  ? ? ?I/O last 3 completed shifts: ?In: 120 [P.O.:120] ?Out: 960 [Urine:870; Chest Tube:90] ? ? ? ?  Latest Ref Rng & Units 04/10/2022  ?  4:08 AM 04/09/2022  ?  3:10 AM 04/08/2022  ?  4:41 PM  ?CBC  ?WBC 4.0 - 10.5 K/uL 17.0   15.7   16.4    ?Hemoglobin 12.0 - 15.0 g/dL 9.2   9.2   9.4    ?Hematocrit 36.0 - 46.0 % 27.9   28.0   28.4    ?Platelets 150 - 400 K/uL 150   148   148    ? ? ? ?  Latest Ref Rng & Units 04/10/2022  ?  4:08 AM 04/09/2022  ?  3:10 AM 04/08/2022  ?  4:41 PM  ?BMP  ?Glucose 70 - 99 mg/dL 105   135   110    ?BUN 8 - 23 mg/dL '31   29   27    '$ ?Creatinine 0.44 - 1.00 mg/dL 1.49   1.76   1.79    ?Sodium 135 - 145 mmol/L 125   129   131    ?Potassium 3.5 - 5.1 mmol/L 4.1   4.2   4.4    ?Chloride 98 - 111 mmol/L 91   96   97    ?CO2 22 - 32 mmol/L '27   25   25    '$ ?Calcium 8.9 - 10.3 mg/dL 8.1   8.0   7.9    ? ? ?ABG ?   ?Component Value Date/Time  ? PHART 7.368 04/08/2022 0229  ? PCO2ART 43.6 04/08/2022 0229  ? PO2ART 192 (H) 04/08/2022 0229  ? HCO3 24.8 04/08/2022 0229  ? TCO2 26 04/08/2022 0229  ? ACIDBASEDEF 2.0 04/07/2022 0947  ? O2SAT 100 04/08/2022 0229  ? ? ? ? ? ?Melodie Bouillon, MD ?04/10/2022 5:12 PM ? ? ?

## 2022-04-10 NOTE — Progress Notes (Signed)
Called due to tachycardia and pacing at 130 bpm.  On review of pacemaker interrogation, patient is in a one-to-one tachycardia at 130 bpm.  Attempts were made to pace the patient out of her tachycardia with atrial pacing down to 220 ms.  The patient continuously went back into her tachycardia.  We Patricia [name] continue amiodarone per cardiac surgery.  Max track rate has been turned down to 100 bpm on her pacemaker and thus she is pacing in the 90s.  She is pacemaker dependent.  When she converts back to normal rhythm, she Patricia [name] need further adjustments to her pacemaker. ? ?Patricia Lai, MD ?

## 2022-04-10 NOTE — Progress Notes (Signed)
? ?   ?[address] ?      [place] [address] ?            [phone]   ?              ?3 Days Post-Op ?Procedure(s) (LRB): ?AORTIC VALVE REPLACEMENT (AVR), USING INSPIRIS 19 MM AORTIC VALVE (N/A) ?TRANSESOPHAGEAL ECHOCARDIOGRAM (TEE) (N/A) ? ? ?Events: ?No events ?ambulating ?_______________________________________________________________ ?Vitals: ?BP (!) 98/52 (BP Location: Right Arm)   Pulse (!) 125   Temp 99.2 ?F (37.3 ?C) (Oral)   Resp (!) 24   Ht '5\' 6"'$  (1.676 m)   Wt 60.7 kg   SpO2 90%   BMI 21.60 kg/m?  ?Filed Weights  ? 04/08/22 0500 04/09/22 0500 04/10/22 0500  ?Weight: 60 kg 58.8 kg 60.7 kg  ? ? ? ?- Neuro: alert NAD ? ?- Cardiovascular: paced at 120.  Intrinsic pacer.  Flutter underneath ? Drips: none.   ?  ? ?- Pulm: EWOB ?  ? ?ABG ?   ?Component Value Date/Time  ? PHART 7.368 04/08/2022 0229  ? PCO2ART 43.6 04/08/2022 0229  ? PO2ART 192 (H) 04/08/2022 0229  ? HCO3 24.8 04/08/2022 0229  ? TCO2 26 04/08/2022 0229  ? ACIDBASEDEF 2.0 04/07/2022 0947  ? O2SAT 100 04/08/2022 0229  ? ? ?- Abd: ND ?- Extremity: warm ? ?Marland KitchenIntake/Output   ?   04/29 0701 ?04/30 0700 04/30 0701 ?05/01 0700  ? P.O.  120  ? I.V. (mL/kg)    ? IV Piggyback    ? Total Intake(mL/kg)  120 (2)  ? Urine (mL/kg/hr) 550 (0.4)   ? Chest Tube    ? Total Output 550   ? Net -550 +120  ?     ?  ? ? ?_______________________________________________________________ ?Labs: ? ?  Latest Ref Rng & Units 04/10/2022  ?  4:08 AM 04/09/2022  ?  3:10 AM 04/08/2022  ?  4:41 PM  ?CBC  ?WBC 4.0 - 10.5 K/uL 17.0   15.7   16.4    ?Hemoglobin 12.0 - 15.0 g/dL 9.2   9.2   9.4    ?Hematocrit 36.0 - 46.0 % 27.9   28.0   28.4    ?Platelets 150 - 400 K/uL 150   148   148    ? ? ?  Latest Ref Rng & Units 04/10/2022  ?  4:08 AM 04/09/2022  ?  3:10 AM 04/08/2022  ?  4:41 PM  ?CMP  ?Glucose 70 - 99 mg/dL 105   135   110    ?BUN 8 - 23 mg/dL '31   29   27    '$ ?Creatinine 0.44 - 1.00 mg/dL 1.49   1.76   1.79    ?Sodium 135 - 145 mmol/L 125   129   131     ?Potassium 3.5 - 5.1 mmol/L 4.1   4.2   4.4    ?Chloride 98 - 111 mmol/L 91   96   97    ?CO2 22 - 32 mmol/L '27   25   25    '$ ?Calcium 8.9 - 10.3 mg/dL 8.1   8.0   7.9    ? ? ?CXR: ?clear ? ?_______________________________________________________________ ? ?Assessment and Plan: ?POD 3 s/p bAVR ? ?Neuro: pain controlled ?CV: PPM pacing at 120.  Increasing BB.  Will curbside EP.  Will start amio. ?Pulm: pulm hyg ?Renal: creat trending down ?GI: on diet ?Heme: stable ?ID: afebrile, on levaquin ?Endo: SSI ?Dispo:  continue ICU care for HR ? ? ?Lucile Crater Maressa Apollo ?04/10/2022 9:11 AM ? ? ?

## 2022-04-11 ENCOUNTER — Ambulatory Visit: Payer: Medicare PPO | Admitting: Dermatology

## 2022-04-11 LAB — GLUCOSE, CAPILLARY
Glucose-Capillary: 109 mg/dL — ABNORMAL HIGH (ref 70–99)
Glucose-Capillary: 177 mg/dL — ABNORMAL HIGH (ref 70–99)
Glucose-Capillary: 156 mg/dL — ABNORMAL HIGH (ref 70–99)
Glucose-Capillary: 106 mg/dL — ABNORMAL HIGH (ref 70–99)
Glucose-Capillary: 160 mg/dL — ABNORMAL HIGH (ref 70–99)

## 2022-04-11 MED ORDER — POLYETHYLENE GLYCOL 3350 17 G PO PACK
17.0000 g | PACK | Freq: Every day | ORAL | Status: DC | PRN
  Administered 2022-04-11: 17 g via ORAL
  Filled 2022-04-11: qty 1

## 2022-04-11 MED ORDER — WARFARIN SODIUM 2.5 MG PO TABS
2.5000 mg | ORAL_TABLET | Freq: Every day | ORAL | Status: AC
  Administered 2022-04-11: 2.5 mg via ORAL
  Filled 2022-04-11: qty 1

## 2022-04-11 MED ORDER — POTASSIUM CHLORIDE CRYS ER 20 MEQ PO TBCR
20.0000 meq | EXTENDED_RELEASE_TABLET | Freq: Two times a day (BID) | ORAL | Status: AC
  Administered 2022-04-11 (×2): 20 meq via ORAL
  Filled 2022-04-11 (×2): qty 1

## 2022-04-11 MED ORDER — WARFARIN - PHYSICIAN DOSING INPATIENT: Freq: Every day | Status: DC

## 2022-04-11 MED ORDER — TORSEMIDE 20 MG PO TABS
40.0000 mg | ORAL_TABLET | Freq: Every day | ORAL | Status: DC
  Administered 2022-04-11 – 2022-04-14 (×4): 40 mg via ORAL
  Filled 2022-04-11 (×4): qty 2

## 2022-04-11 MED ORDER — AMIODARONE HCL 200 MG PO TABS
400.0000 mg | ORAL_TABLET | Freq: Two times a day (BID) | ORAL | Status: DC
  Administered 2022-04-11: 400 mg via ORAL
  Filled 2022-04-11: qty 2

## 2022-04-11 MED ORDER — ~~LOC~~ CARDIAC SURGERY, PATIENT & FAMILY EDUCATION: Freq: Once | Status: AC

## 2022-04-11 MED ORDER — SODIUM CHLORIDE 0.9 % IV SOLN: 250.0000 mL | INTRAVENOUS | Status: DC | PRN

## 2022-04-11 MED ORDER — AMIODARONE HCL IN DEXTROSE 360-4.14 MG/200ML-% IV SOLN
60.0000 mg/h | INTRAVENOUS | Status: AC
  Administered 2022-04-11: 60 mg/h via INTRAVENOUS
  Filled 2022-04-11: qty 200

## 2022-04-11 MED ORDER — SODIUM CHLORIDE 0.9% FLUSH
3.0000 mL | Freq: Two times a day (BID) | INTRAVENOUS | Status: DC
  Administered 2022-04-11 – 2022-04-14 (×6): 3 mL via INTRAVENOUS

## 2022-04-11 MED ORDER — INSULIN ASPART 100 UNIT/ML IJ SOLN
0.0000 [IU] | Freq: Three times a day (TID) | INTRAMUSCULAR | Status: DC
  Administered 2022-04-11 – 2022-04-14 (×5): 2 [IU] via SUBCUTANEOUS

## 2022-04-11 MED ORDER — ONDANSETRON HCL 4 MG/2ML IJ SOLN
4.0000 mg | Freq: Four times a day (QID) | INTRAMUSCULAR | Status: DC | PRN
Start: 2022-04-11 — End: 2022-04-14

## 2022-04-11 MED ORDER — AMIODARONE HCL IN DEXTROSE 360-4.14 MG/200ML-% IV SOLN
30.0000 mg/h | INTRAVENOUS | Status: DC
Start: 2022-04-12 — End: 2022-04-12
  Filled 2022-04-11: qty 200

## 2022-04-11 MED ORDER — ONDANSETRON HCL 4 MG PO TABS: 4.0000 mg | ORAL_TABLET | Freq: Four times a day (QID) | ORAL | Status: DC | PRN

## 2022-04-11 MED ORDER — SODIUM CHLORIDE 0.9% FLUSH: 3.0000 mL | INTRAVENOUS | Status: DC | PRN

## 2022-04-11 NOTE — Progress Notes (Addendum)
?  Device settings returned to normal with Dr. Curt Bears this am.  ? ?Current rhythm likely sinus tach 100-110.   ? ?Agree with transition to po amiodarone.  If elevates HRs recur, can go back to IV amio. ? ?Titrate BB as tolerated/necessary.  ? ?EP to see as needed.  ? ? ?Follow up made.  ? ?Lollie Marrow, PA-C  ?04/11/2022 7:52 AM  ? ?

## 2022-04-11 NOTE — Progress Notes (Addendum)
Patient's HR in the 120s-130s. Patient is stating her cough is getting worse and has been getting up to use the restroom with staff which increases her HR. Patient denies pain but states she can feel palpitations. Paged on call cardiology, awaiting for response.  ? ? ?Update: will restart iv amio for tonight. ?

## 2022-04-11 NOTE — Progress Notes (Signed)
4 Days Post-Op Procedure(s) (LRB): ?AORTIC VALVE REPLACEMENT (AVR), USING INSPIRIS 19 MM AORTIC VALVE (N/A) ?TRANSESOPHAGEAL ECHOCARDIOGRAM (TEE) (N/A) ?Subjective: ?No complaints. Walked this am. No BM since surgery. ? ?Objective: ?Vital signs in last 24 hours: ?Temp:  [97.7 ?F (36.5 ?C)-99.2 ?F (37.3 ?C)] 98 ?F (36.7 ?C) (05/01 0500) ?Pulse Rate:  [84-125] 94 (05/01 0600) ?Cardiac Rhythm: Normal sinus rhythm (05/01 0400) ?Resp:  [12-28] 15 (05/01 0600) ?BP: (98-138)/(52-69) 134/63 (05/01 0600) ?SpO2:  [88 %-100 %] 100 % (05/01 0600) ?Weight:  [61.5 kg] 61.5 kg (05/01 0500) ? ?Hemodynamic parameters for last 24 hours: ?  ? ?Intake/Output from previous day: ?04/30 0701 - 05/01 0700 ?In: 809.2 [P.O.:420; I.V.:389.2] ?Out: 775 [Urine:775] ?Intake/Output this shift: ?No intake/output data recorded. ? ?General appearance: alert and cooperative ?Neurologic: intact ?Heart: regular rate and rhythm, S1, S2 normal, no murmur, click, rub or gallop ?Lungs: diminished breath sounds bibasilar ?Extremities: edema mild ?Wound: incision ok ? ?Lab Results: ?Recent Labs  ?  04/09/22 ?0310 04/10/22 ?0408  ?WBC 15.7* 17.0*  ?HGB 9.2* 9.2*  ?HCT 28.0* 27.9*  ?PLT 148* 150  ? ?BMET:  ?Recent Labs  ?  04/09/22 ?0310 04/10/22 ?0408  ?NA 129* 125*  ?K 4.2 4.1  ?CL 96* 91*  ?CO2 25 27  ?GLUCOSE 135* 105*  ?BUN 29* 31*  ?CREATININE 1.76* 1.49*  ?CALCIUM 8.0* 8.1*  ?  ?PT/INR: No results for input(s): LABPROT, INR in the last 72 hours. ?ABG ?   ?Component Value Date/Time  ? PHART 7.368 04/08/2022 0229  ? HCO3 24.8 04/08/2022 0229  ? TCO2 26 04/08/2022 0229  ? ACIDBASEDEF 2.0 04/07/2022 0947  ? O2SAT 100 04/08/2022 0229  ? ?CBG (last 3)  ?Recent Labs  ?  04/10/22 ?2008 04/10/22 ?2349 04/11/22 ?0502  ?GLUCAP 182* 138* 109*  ? ? ?Assessment/Plan: ?S/P Procedure(s) (LRB): ?AORTIC VALVE REPLACEMENT (AVR), USING INSPIRIS 19 MM AORTIC VALVE (N/A) ?TRANSESOPHAGEAL ECHOCARDIOGRAM (TEE) (N/A) ? ?POD 4 ?Hemodynamically stable. HR has been paced in 90's  since pacer adjusted by EP. Not sure if this is sinus. Remains on amio IV. Will switch to po. 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 She is aspirin allergic. ? ?Volume excess: She is 22 lbs over preop weight and has sodium 125. She had some hyponatremia preop to this level. Will start Demadex. If sodium drops any further will use tolvaptan. ? ?Glucose under adequate control on SSI.  ? ?DC sleeve. ? ?Transfer to 4E and continue mobilization, IS. ? ? LOS: 4 days  ? ? ?Patricia [name] ?04/11/2022 ? ? ?

## 2022-04-12 ENCOUNTER — Ambulatory Visit: Payer: Medicare PPO | Admitting: Cardiology

## 2022-04-12 ENCOUNTER — Other Ambulatory Visit: Payer: Self-pay | Admitting: Cardiology

## 2022-04-12 DIAGNOSIS — Z952 Presence of prosthetic heart valve: Secondary | ICD-10-CM

## 2022-04-12 LAB — CBC
HCT: 27.9 % — ABNORMAL LOW (ref 36.0–46.0)
Hemoglobin: 9.3 g/dL — ABNORMAL LOW (ref 12.0–15.0)
MCH: 27.9 pg (ref 26.0–34.0)
MCHC: 33.3 g/dL (ref 30.0–36.0)
MCV: 83.8 fL (ref 80.0–100.0)
Platelets: 199 10*3/uL (ref 150–400)
RBC: 3.33 MIL/uL — ABNORMAL LOW (ref 3.87–5.11)
RDW: 17 % — ABNORMAL HIGH (ref 11.5–15.5)
WBC: 14.7 10*3/uL — ABNORMAL HIGH (ref 4.0–10.5)
nRBC: 0.4 % — ABNORMAL HIGH (ref 0.0–0.2)

## 2022-04-12 LAB — PROTIME-INR
INR: 1.1 (ref 0.8–1.2)
Prothrombin Time: 14.4 seconds (ref 11.4–15.2)

## 2022-04-12 LAB — BASIC METABOLIC PANEL
Anion gap: 10 (ref 5–15)
BUN: 27 mg/dL — ABNORMAL HIGH (ref 8–23)
CO2: 28 mmol/L (ref 22–32)
Calcium: 8.7 mg/dL — ABNORMAL LOW (ref 8.9–10.3)
Chloride: 88 mmol/L — ABNORMAL LOW (ref 98–111)
Creatinine, Ser: 1.24 mg/dL — ABNORMAL HIGH (ref 0.44–1.00)
GFR, Estimated: 47 mL/min — ABNORMAL LOW (ref >60)
Glucose, Bld: 145 mg/dL — ABNORMAL HIGH (ref 70–99)
Potassium: 4.6 mmol/L (ref 3.5–5.1)
Sodium: 126 mmol/L — ABNORMAL LOW (ref 135–145)

## 2022-04-12 LAB — GLUCOSE, CAPILLARY
Glucose-Capillary: 140 mg/dL — ABNORMAL HIGH (ref 70–99)
Glucose-Capillary: 120 mg/dL — ABNORMAL HIGH (ref 70–99)
Glucose-Capillary: 108 mg/dL — ABNORMAL HIGH (ref 70–99)
Glucose-Capillary: 135 mg/dL — ABNORMAL HIGH (ref 70–99)

## 2022-04-12 MED ORDER — GUAIFENESIN ER 600 MG PO TB12
600.0000 mg | ORAL_TABLET | Freq: Two times a day (BID) | ORAL | Status: DC
  Administered 2022-04-12 – 2022-04-14 (×5): 600 mg via ORAL
  Filled 2022-04-12 (×5): qty 1

## 2022-04-12 MED ORDER — AMIODARONE HCL 200 MG PO TABS
400.0000 mg | ORAL_TABLET | Freq: Two times a day (BID) | ORAL | Status: DC
  Administered 2022-04-12 – 2022-04-14 (×5): 400 mg via ORAL
  Filled 2022-04-12 (×5): qty 2

## 2022-04-12 NOTE — Progress Notes (Signed)
Mobility Specialist Progress Note ? ? 04/12/22 1733  ?Mobility  ?Activity Ambulated independently in hallway  ?Level of Assistance Modified independent, requires aide device or extra time  ?Assistive Device Front wheel walker  ?Distance Ambulated (ft) 470 ft  ?Activity Response Tolerated well  ?$Mobility charge 1 Mobility  ? ?Pre Mobility: 123 HR, 129/56 BP, 99% SpO2 on RA  ?During Mobility: 125 HR, 98% SpO2 on RA  ?Post Mobility: 121 HR, 133/61 BP, 96% SpO2 on RA  ? ?Received pt in bed having no complaints and agreeable to mobility. Demonstrated proper sternal precautions and required no physical assist throughout. Asymptomatic throughout ambulation, returned back to bed w/ call bell in reach and all needs met. ? ?Holland Falling ?Mobility Specialist ?Phone Number [phone] ? ?

## 2022-04-12 NOTE — Progress Notes (Addendum)
? ?   ?  [address] ?      [place] [address] ?            [phone]   ? ?  ?5 Days Post-Op Procedure(s) (LRB): ?AORTIC VALVE REPLACEMENT (AVR), USING INSPIRIS 19 MM AORTIC VALVE (N/A) ?TRANSESOPHAGEAL ECHOCARDIOGRAM (TEE) (N/A) ? ?Subjective: ? ?Patient sitting up in bed.  Having trouble coughing sputum up.  + ambulation + BM.Marland Kitchen She is requesting a walker ? ?Objective: ?Vital signs in last 24 hours: ?Temp:  [98 ?F (36.7 ?C)-98.7 ?F (37.1 ?C)] 98.1 ?F (36.7 ?C) (05/02 0750) ?Pulse Rate:  [95-120] 106 (05/02 0750) ?Cardiac Rhythm: A-V Sequential paced;Bundle branch block (05/01 1947) ?Resp:  [12-20] 20 (05/02 0750) ?BP: (95-145)/(53-65) 120/56 (05/02 0750) ?SpO2:  [90 %-99 %] 95 % (05/02 0750) ?Weight:  [60.1 kg] 60.1 kg (05/02 0500) ? ?Intake/Output from previous day: ?05/01 0701 - 05/02 0700 ?In: 275 [I.V.:275] ?Out: -  ? ?General appearance: alert, cooperative, and no distress ?Heart: regular rate and rhythm and paced ?Lungs: clear to auscultation bilaterally ?Abdomen: soft, non-tender; bowel sounds normal; no masses,  no organomegaly ?Extremities: edema trace ?Wound: clean and dry ? ?Lab Results: ?Recent Labs  ?  04/10/22 ?0408 04/12/22 ?0155  ?WBC 17.0* 14.7*  ?HGB 9.2* 9.3*  ?HCT 27.9* 27.9*  ?PLT 150 199  ? ?BMET:  ?Recent Labs  ?  04/10/22 ?0408 04/12/22 ?0155  ?NA 125* 126*  ?K 4.1 4.6  ?CL 91* 88*  ?CO2 27 28  ?GLUCOSE 105* 145*  ?BUN 31* 27*  ?CREATININE 1.49* 1.24*  ?CALCIUM 8.1* 8.7*  ?  ?PT/INR:  ?Recent Labs  ?  04/12/22 ?0155  ?LABPROT 14.4  ?INR 1.1  ? ?ABG ?   ?Component Value Date/Time  ? PHART 7.368 04/08/2022 0229  ? HCO3 24.8 04/08/2022 0229  ? TCO2 26 04/08/2022 0229  ? ACIDBASEDEF 2.0 04/07/2022 0947  ? O2SAT 100 04/08/2022 0229  ? ?CBG (last 3)  ?Recent Labs  ?  04/11/22 ?1717 04/11/22 ?2114 04/12/22 ?2353  ?GLUCAP 106* 160* 140*  ? ? ?Assessment/Plan: ?S/P Procedure(s) (LRB): ?AORTIC VALVE REPLACEMENT (AVR), USING INSPIRIS 19 MM AORTIC VALVE (N/A) ?TRANSESOPHAGEAL  ECHOCARDIOGRAM (TEE) (N/A) ? ?CV- post operative Atrial arrhythmias, maintaining NSR after EP has adjusted PPM- IV Amiodarone started last night for palpitations, will transition back to oral Amiodarone this morning, continue Lopressor at 25 mg BID, BP is too labile to increase further ?INR 1.1, will repeat coumadin at 2.5 mg this evening, if no response will increase dose tomorrow ?Renal- creatinine is improving down to 1.24, she is volume overloaded, but is responding well to Demadex will continue, K is okay at 4.6 ?Hyponatremia- stable at 126 ?DM- sugars are well controlled on current insulin regimen ?Deconditioning- mild, patient requesting rolling walker, will get PT consult to assess for home needs ?Dispo- patient stable, transition back to oral Amiodarone, continue Lopressor, repeat coumadin this evening at 2.5 mg daily, continue diuretics, PT consult ? ? LOS: 5 days  ? ?Ellwood Handler, PA-C ?04/12/2022 ? ? Chart reviewed, patient examined, agree with above. ?She feels better overall. Rhythm is stable sinus tachy 100-105 with atrial sensing and v-pacing. Continue oral amio and Lopressor. ?INR not bumped yet. Goal is 2-2.5. ?She is diuresing. Wt is down 3 lbs from yesterday and less edema. Continue Demadex. Sodium low but stable at 126. ?

## 2022-04-12 NOTE — Evaluation (Signed)
Physical Therapy Evaluation ?Patient Details ?Name: Patricia [name] ?MRN: [id number] ?DOB: 1951-05-31 ?Today's Date: 04/12/2022 ? ?History of Present Illness ? This 71 year old woman has stage D, severe, symptomatic aortic insufficiency with [place] Heart Association class III symptoms of exertional fatigue and shortness of breath with low-level activity consistent with chronic diastolic congestive heart failure.  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 S/P aortic valve replacement, TEE. ?  ?Clinical Impression ? Patient received sitting edge of bed. She is agreeable to PT session, requires stop in bathroom prior to ambulation. She is min guard for sit to stand from bed with good demo of sternal precautions. Min guard for ambulation to bathroom. Required min A to rise from commode. Ambulated 200 feet with RW and min guard/supervision. Patient doing well with mobility and should progress quickly. HR up to 126 with ambulation.   ?   ? ?Recommendations for follow up therapy are one component of a multi-disciplinary discharge planning process, led by the attending physician.  Recommendations may be updated based on patient status, additional functional criteria and insurance authorization. ? ?Follow Up Recommendations Home health PT ? ?  ?Assistance Recommended at Discharge Intermittent Supervision/Assistance  ?Patient can return home with the following ? A little help with bathing/dressing/bathroom;Help with stairs or ramp for entrance;Assist for transportation;Assistance with cooking/housework ? ?  ?Equipment Recommendations Rolling walker (2 wheels)  ?Recommendations for Other Services ?    ?  ?Functional Status Assessment Patient has had a recent decline in their functional status and demonstrates the ability to make significant  improvements in function in a reasonable and predictable amount of time.  ? ?  ?Precautions / Restrictions Precautions ?Precautions: Sternal ?Precaution Booklet Issued: No ?Restrictions ?Weight Bearing Restrictions: No  ? ?  ? ?Mobility ? Bed Mobility ?  ?  ?  ?  ?  ?  ?  ?General bed mobility comments: patient received sitting up on edge of bed upon arrival to room. ?  ? ?Transfers ?Overall transfer level: Needs assistance ?Equipment used: None ?Transfers: Sit to/from Stand ?Sit to Stand: Min assist, Min guard ?  ?  ?  ?  ?  ?General transfer comment: able to stand from bed maintaining sternal precautions with rocking for momentum min guard. Required min A to rise from commode. ?  ? ?Ambulation/Gait ?Ambulation/Gait assistance: Supervision ?Gait Distance (Feet): 200 Feet ?Assistive device: Rolling walker (2 wheels) ?Gait Pattern/deviations: Step-through pattern, Decreased stride length ?Gait velocity: decr ?  ?  ?General Gait Details: generally steady, safe with mobility ? ?Stairs ?  ?  ?  ?  ?  ? ?Wheelchair Mobility ?  ? ?Modified Rankin (Stroke Patients Only) ?  ? ?  ? ?Balance Overall balance assessment: Modified Independent ?  ?  ?  ?  ?  ?  ?  ?  ?  ?  ?  ?  ?  ?  ?  ?  ?  ?  ?   ? ? ? ?Pertinent Vitals/Pain Pain Assessment ?Pain Assessment: Faces ?Faces Pain Scale: Hurts a little bit ?Pain Location: sternal ?Pain Descriptors / Indicators: Discomfort, Sore ?Pain Intervention(s): Monitored during session  ? ? ?Home Living Family/patient expects to be discharged to:: Private residence ?Living Arrangements: Alone ?Available Help at Discharge: Friend(s);Neighbor ?Type of Home: House ?Home Access: Stairs to enter ?Entrance Stairs-Rails: Right ?Entrance  Stairs-Number of Steps: 2 ?  ?Home Layout: One level ?Home Equipment: None ?   ?  ?Prior Function Prior Level of Function : Independent/Modified Independent;Driving ?  ?  ?  ?  ?  ?  ?  ?  ?  ? ? ?Hand Dominance  ?   ? ?  ?Extremity/Trunk Assessment  ? Upper  Extremity Assessment ?Upper Extremity Assessment: RUE deficits/detail;LUE deficits/detail ?RUE Deficits / Details: sternal precautions ?LUE Deficits / Details: sternal precautions ?  ? ?  ?  ? ?Cervical / Trunk Assessment ?Cervical / Trunk Assessment: Normal  ?Communication  ? Communication: No difficulties  ?Cognition Arousal/Alertness: Awake/alert ?Behavior During Therapy: [facility] for tasks assessed/performed ?Overall Cognitive Status: Within Functional Limits for tasks assessed ?  ?  ?  ?  ?  ?  ?  ?  ?  ?  ?  ?  ?  ?  ?  ?  ?  ?  ?  ? ?  ?General Comments   ? ?  ?Exercises    ? ?Assessment/Plan  ?  ?PT Assessment Patient needs continued PT services  ?PT Problem List Decreased strength;Decreased mobility;Decreased activity tolerance;Pain;Decreased knowledge of precautions;Decreased knowledge of use of DME ? ?   ?  ?PT Treatment Interventions DME instruction;Therapeutic exercise;Gait training;Stair training;Functional mobility training;Therapeutic activities;Patient/family education   ? ?PT Goals (Current goals can be found in the Care Plan section)  ?Acute Rehab PT Goals ?Patient Stated Goal: to return home ?PT Goal Formulation: With patient ?Time For Goal Achievement: 04/19/22 ?Potential to Achieve Goals: Good ? ?  ?Frequency Min 3X/week ?  ? ? ?Co-evaluation   ?  ?  ?  ?  ? ? ?  ?AM-PAC PT "6 Clicks" Mobility  ?Outcome Measure Help needed turning from your back to your side while in a flat bed without using bedrails?: A Little ?Help needed moving from lying on your back to sitting on the side of a flat bed without using bedrails?: A Little ?Help needed moving to and from a bed to a chair (including a wheelchair)?: A Little ?Help needed standing up from a chair using your arms (e.g., wheelchair or bedside chair)?: A Little ?Help needed to walk in hospital room?: A Little ?Help needed climbing 3-5 steps with a railing? : A Little ?6 Click Score: 18 ? ?  ?End of Session Equipment Utilized During Treatment: Gait  belt ?Activity Tolerance: Patient tolerated treatment well ?Patient left: in chair;with call bell/phone within reach ?Nurse Communication: Mobility status ?PT Visit Diagnosis: Other abnormalities of gait and mobility (R26.89);Muscle weakness (generalized) (M62.81) ?  ? ?Time: 2426-8341 ?PT Time Calculation (min) (ACUTE ONLY): 18 min ? ? ?Charges:   PT Evaluation ?$PT Eval Moderate Complexity: 1 Mod ?PT Treatments ?$Gait Training: 8-22 mins ?  ?   ? ? ?Amanda Cockayne, PT, GCS ?04/12/22,11:12 AM ? ? ?

## 2022-04-12 NOTE — Care Management Important Message (Signed)
Important Message ? ?Patient Details  ?Name: Patricia [name] ?MRN: [id number] ?Date of Birth: [birth year] ? ? ?Medicare Important Message Given:  Yes ? ? ? ? ?Shelda Altes ?04/12/2022, 8:50 AM ?

## 2022-04-12 NOTE — Progress Notes (Signed)
CARDIAC REHAB PHASE I  ? ?PRE:  Rate/Rhythm: 99 paced ? ?BP:  Sitting: 112/54     ? ?SaO2: 96 RA ? ?MODE:  Ambulation: 400 ft  ? ?POST:  Rate/Rhythm: 110 paced ? ?BP:  Sitting: 132/65 ? ?  SaO2: 97 RA ? ?Pt ambulated 430f in hallway standby assist with front wheel walker. Pt denies SOB or dizziness throughout, does state some incisional pain. Pt assisted to BR than returned to recliner. Encouraged continued IS use and ambulation. Dinner order placed, encouraged nutrition. Will continue to follow. ? ?1324-1410 ?TRufina Falco RN BSN ?04/12/2022 ?2:20 PM ? ?

## 2022-04-13 ENCOUNTER — Other Ambulatory Visit: Payer: Self-pay

## 2022-04-13 ENCOUNTER — Encounter (HOSPITAL_COMMUNITY): Payer: Self-pay | Admitting: Surgery

## 2022-04-13 LAB — GLUCOSE, CAPILLARY
Glucose-Capillary: 115 mg/dL — ABNORMAL HIGH (ref 70–99)
Glucose-Capillary: 118 mg/dL — ABNORMAL HIGH (ref 70–99)
Glucose-Capillary: 159 mg/dL — ABNORMAL HIGH (ref 70–99)
Glucose-Capillary: 134 mg/dL — ABNORMAL HIGH (ref 70–99)

## 2022-04-13 LAB — PROTIME-INR
INR: 1.1 (ref 0.8–1.2)
Prothrombin Time: 13.7 seconds (ref 11.4–15.2)

## 2022-04-13 MED ORDER — PHENOL 1.4 % MT LIQD
1.0000 | OROMUCOSAL | Status: DC | PRN
  Filled 2022-04-13: qty 177

## 2022-04-13 MED ORDER — WARFARIN SODIUM 5 MG PO TABS
5.0000 mg | ORAL_TABLET | Freq: Every day | ORAL | Status: AC
  Administered 2022-04-13: 5 mg via ORAL
  Filled 2022-04-13: qty 1

## 2022-04-13 NOTE — Progress Notes (Addendum)
? ?   ?  [address] ?      [place] [address] ?            [phone]   ? ?  ?6 Days Post-Op Procedure(s) (LRB): ?AORTIC VALVE REPLACEMENT (AVR), USING INSPIRIS 19 MM AORTIC VALVE (N/A) ?TRANSESOPHAGEAL ECHOCARDIOGRAM (TEE) (N/A) ? ?Subjective: ? ?Patient sitting up in bed.  She overall feels well.  She feels like she is not quite as swollen ? ?Objective: ?Vital signs in last 24 hours: ?Temp:  [98.1 ?F (36.7 ?C)-99 ?F (37.2 ?C)] 98.2 ?F (36.8 ?C) (05/03 9147) ?Pulse Rate:  [84-110] 105 (05/03 0523) ?Cardiac Rhythm: Ventricular paced;Bundle branch block (05/02 1934) ?Resp:  [13-20] 18 (05/03 0523) ?BP: (102-120)/(40-57) 114/54 (05/03 0523) ?SpO2:  [91 %-96 %] 91 % (05/03 0523) ?Weight:  [60.1 kg] 60.1 kg (05/03 0615) ? ?General appearance: alert, cooperative, and no distress ?Heart: regular rate and rhythm ?Lungs: clear to auscultation bilaterally ?Abdomen: soft, non-tender; bowel sounds normal; no masses,  no organomegaly ?Extremities: edema pitting edema ?Wound: clean and dry ? ?Lab Results: ?Recent Labs  ?  04/12/22 ?0155  ?WBC 14.7*  ?HGB 9.3*  ?HCT 27.9*  ?PLT 199  ? ?BMET:  ?Recent Labs  ?  04/12/22 ?0155  ?NA 126*  ?K 4.6  ?CL 88*  ?CO2 28  ?GLUCOSE 145*  ?BUN 27*  ?CREATININE 1.24*  ?CALCIUM 8.7*  ?  ?PT/INR:  ?Recent Labs  ?  04/13/22 ?0227  ?LABPROT 13.7  ?INR 1.1  ? ?ABG ?   ?Component Value Date/Time  ? PHART 7.368 04/08/2022 0229  ? HCO3 24.8 04/08/2022 0229  ? TCO2 26 04/08/2022 0229  ? ACIDBASEDEF 2.0 04/07/2022 0947  ? O2SAT 100 04/08/2022 0229  ? ?CBG (last 3)  ?Recent Labs  ?  04/12/22 ?1627 04/12/22 ?2119 04/13/22 ?0616  ?GLUCAP 108* 135* 115*  ? ? ?Assessment/Plan: ?S/P Procedure(s) (LRB): ?AORTIC VALVE REPLACEMENT (AVR), USING INSPIRIS 19 MM AORTIC VALVE (N/A) ?TRANSESOPHAGEAL ECHOCARDIOGRAM (TEE) (N/A) ? ?CV- Sinus Tach rates in the low 100s, PPM in place working appropriately, on oral Amiodarone, Lopressor ?INR 1.1, will give 5 mg coumadin daily ?Pulm- no acute issues,  continue IS ?Renal- weight stable, continue Demadex ?DM- sugars are controlled, continue current regimen ?Deconditioning- PT recommending home health PT ?DIspo- patient stable, increase coumadin to 5 mg daily, continue Demadex, will place H/H orders, continue current care ? ? LOS: 6 days  ? ?Ellwood Handler, PA-C ?04/13/2022 ? ? Chart reviewed, patient examined, agree with above. ?She looks good and feels well. Anxious to go home. ?INR has not bumped so will increase Coumadin to 5 which is what she was on previously when she had her MVR.  ? ?Wt stable but she feels like edema is improving daily. Will continue Demedex 40 and check bmet tomorrow. ? ?I think she can probably go home tomorrow with INR followup. ?

## 2022-04-13 NOTE — TOC Initial Note (Signed)
Transition of Care (TOC) - Initial/Assessment Note  ?Marvetta Gibbons Therapist, sports, BSN ?Transitions of Care ?Unit 4E- RN Case Manager ?See Treatment Team for direct phone #  ? ? ?Patient Details  ?Name: Patricia [name] ?MRN: [id number] ?Date of Birth: February 02, 1951 ? ?Transition of Care (TOC) CM/SW Contact:    ?Dahlia Client, Romeo Rabon, RN ?Phone Number: ?04/13/2022, 2:42 PM ? ?Clinical Narrative:                 ?Pt s/p AVR, from home. Orders have been placed for DME and HHPT needs.  ?CM in to speak with pt at bedside.  ?List provided for choice Per CMS guidelines from medicare.gov website with star ratings (copy placed in shadow chart), pt agreeable to use in house provider for DME needs, after review of [place] list has selected [facility] for [facility] needs.  ? ?Pt voiced she will have transportation home on discharge, address, phone # and PCP all confirmed with pt in epic.  ? ?Call made to [employer] for DME need- RW to be delivered to room prior to discharge.  ? ?Call made to [facility] with [facility] for [place] referral- referral has been accepted and will follow for start of care.  ? ? ?Expected Discharge Plan: [place] ?Barriers to Discharge: Continued Medical Work up ? ? ?Patient Goals and CMS Choice ?Patient states their goals for this hospitalization and ongoing recovery are:: return home ?CMS Medicare.gov Compare Post Acute Care list provided to:: Patient ?Choice offered to / list presented to : Patient ? ?Expected Discharge Plan and Services ?Expected Discharge Plan: [place] ?  ?Discharge Planning Services: CM Consult ?Post Acute Care Choice: Durable Medical Equipment, Home Health ?Living arrangements for the past 2 months: [place] ?                ?DME Arranged: Walker rolling ?DME Agency: [facility] ?Date DME Agency Contacted: 04/13/22 ?Time DME Agency Contacted: [phone] ?Representative spoke with at DME Agency: Delana Meyer ?HH Arranged: PT ?[place] Agency: [place] ?Date HH Agency Contacted:  04/13/22 ?Time [place]: [phone] ?Representative spoke with at [place]: Tommi Rumps ? ?Prior Living Arrangements/Services ?Living arrangements for the past 2 months: [place] ?Lives with:: Self ?Patient language and need for interpreter reviewed:: Yes ?Do you feel safe going back to the place where you live?: Yes      ?Need for Family Participation in Patient Care: Yes (Comment) ?Care giver support system in place?: Yes (comment) ?  ?Criminal Activity/Legal Involvement Pertinent to Current Situation/Hospitalization: No - Comment as needed ? ?Activities of Daily Living ?Home Assistive Devices/Equipment: Eyeglasses ?ADL Screening (condition at time of admission) ?Patient's cognitive ability adequate to safely complete daily activities?: Yes ?Is the patient deaf or have difficulty hearing?: No ?Does the patient have difficulty seeing, even when wearing glasses/contacts?: No ?Does the patient have difficulty concentrating, remembering, or making decisions?: No ?Patient able to express need for assistance with ADLs?: Yes ?Does the patient have difficulty dressing or bathing?: No ?Independently performs ADLs?: Yes (appropriate for developmental age) ?Does the patient have difficulty walking or climbing stairs?: No ?Weakness of Legs: None ?Weakness of Arms/Hands: None ? ?Permission Sought/Granted ?Permission sought to share information with : Customer service manager ?Permission granted to share information with : Yes, Verbal Permission Granted ?   ? Permission granted to share info w AGENCY: HH/DME ?   ?   ? ?Emotional Assessment ?Appearance:: Appears stated age ?Attitude/Demeanor/Rapport: Engaged ?Affect (typically observed):  Accepting, Appropriate, Pleasant ?Orientation: : Oriented to Self, Oriented to Place, Oriented to  Time, Oriented to Situation ?Alcohol / Substance Use: Not Applicable ?Psych Involvement: No (comment) ? ?Admission diagnosis:  S/P aortic valve replacement [Z95.2] ?Patient Active  Problem List  ? Diagnosis Date Noted  ? 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/07/2022  ? Pacemaker 03/08/2022  ? CHB (complete heart block) ([place]) 12/27/2021  ? S/P MVR (mitral valve repair) 10/02/2019  ? Pneumonia   ? Hodgkin's lymphoma ([place])   ? GERD (gastroesophageal reflux disease)   ? COPD (chronic obstructive pulmonary disease) ([place])   ? Atrial flutter ([place])   ? Breast cancer ([place])   ? Bleeding gastric ulcer   ? Long term (current) use of anticoagulants [Z79.01] 09/25/2017  ? 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 Pulmonary HTN ([place]) 06/25/2017  ? Mitral valve stenosis 04/25/2017  ? Aortic valve regurgitation 04/25/2017  ? Osteoporosis   ? Pancreatitis   ? Prediabetes   ? Acute respiratory failure with hypoxia ([place]) 01/07/2015  ? Hodgkin lymphoma ([place]) 01/07/2015  ? Abdominal pain, epigastric   ? Diarrhea   ? AKI (acute kidney injury) ([place])   ? Hyponatremia   ? Acute pancreatitis 12/25/2014  ? Leukocytosis   ? Mitral regurgitation 06/07/2012  ? Syncope 04/26/2012  ? HTN (hypertension) 04/26/2012  ? Abnormal stress test 04/26/2012  ? Hyperkalemia 04/26/2012  ? Tachycardia 04/26/2012  ? ?PCP:  Susy Frizzle, MD ?Pharmacy:   ?[facility] ?[place] [address] ?Phone: [phone] Fax: [phone] ? ? ? ? ?Social Determinants of Health (SDOH) Interventions ?  ? ?Readmission Risk Interventions ?   ? View : No data to display.  ?  ?  ?  ? ? ? ?

## 2022-04-13 NOTE — Progress Notes (Signed)
CARDIAC REHAB PHASE I  ? ?PRE:  Rate/Rhythm: 102 paced ? ?BP:  Sitting: 110/65     ? ?SaO2: 96 RA ? ?MODE:  Ambulation: 450 ft  ? ?POST:  Rate/Rhythm: 111 paced ? ?BP:  Sitting: 129/78 ? ?  SaO2: 97 RA ? ?Pt ambulated 480f in hallway assist of one with front wheel walker. Pt denies CP, SOB, or dizziness, does endorse some leg weakness. Pt assisted to BR than returned to EOB. Pt denies questions or concerns at this time. Demonstrating ~750 on IS. Pharmacy at bedside. Will continue to follow. ? ?[id number] ?TRufina Falco RN BSN ?04/13/2022 ?10:56 AM ? ?

## 2022-04-13 NOTE — Progress Notes (Signed)
Mobility Specialist Progress Note ? ? 04/13/22 1620  ?Mobility  ?Activity Ambulated independently in hallway  ?Level of Assistance Modified independent, requires aide device or extra time  ?Assistive Device Front wheel walker  ?Distance Ambulated (ft) 470 ft  ?Activity Response Tolerated well  ?$Mobility charge 1 Mobility  ? ?Received pt in BR having no complaints and agreeable to mobility. Asymptomatic throughout ambulation, returned back to chair w/ call bell in reach and all needs met. ? ?Holland Falling ?Mobility Specialist ?Phone Number [phone] ? ?

## 2022-04-14 LAB — BASIC METABOLIC PANEL
Anion gap: 10 (ref 5–15)
BUN: 29 mg/dL — ABNORMAL HIGH (ref 8–23)
CO2: 36 mmol/L — ABNORMAL HIGH (ref 22–32)
Calcium: 8.8 mg/dL — ABNORMAL LOW (ref 8.9–10.3)
Chloride: 86 mmol/L — ABNORMAL LOW (ref 98–111)
Creatinine, Ser: 1.25 mg/dL — ABNORMAL HIGH (ref 0.44–1.00)
GFR, Estimated: 46 mL/min — ABNORMAL LOW (ref >60)
Glucose, Bld: 121 mg/dL — ABNORMAL HIGH (ref 70–99)
Potassium: 3.8 mmol/L (ref 3.5–5.1)
Sodium: 132 mmol/L — ABNORMAL LOW (ref 135–145)

## 2022-04-14 LAB — GLUCOSE, CAPILLARY: Glucose-Capillary: 130 mg/dL — ABNORMAL HIGH (ref 70–99)

## 2022-04-14 LAB — PROTIME-INR
INR: 1.1 (ref 0.8–1.2)
Prothrombin Time: 14 seconds (ref 11.4–15.2)

## 2022-04-14 MED ORDER — AMIODARONE HCL 200 MG PO TABS: 400.0000 mg | ORAL_TABLET | Freq: Two times a day (BID) | ORAL | 1 refills | Status: DC

## 2022-04-14 MED ORDER — TORSEMIDE 40 MG PO TABS: 40.0000 mg | ORAL_TABLET | Freq: Every day | ORAL | 0 refills | Status: DC

## 2022-04-14 MED ORDER — WARFARIN SODIUM 5 MG PO TABS: 5.0000 mg | ORAL_TABLET | Freq: Every day | ORAL | 3 refills | Status: DC

## 2022-04-14 MED ORDER — TRAMADOL HCL 50 MG PO TABS: 50.0000 mg | ORAL_TABLET | Freq: Four times a day (QID) | ORAL | 0 refills | Status: DC | PRN

## 2022-04-14 MED ORDER — METOPROLOL TARTRATE 25 MG PO TABS: 25.0000 mg | ORAL_TABLET | Freq: Two times a day (BID) | ORAL | 3 refills | Status: DC

## 2022-04-14 MED ORDER — ATORVASTATIN CALCIUM 20 MG PO TABS: 20.0000 mg | ORAL_TABLET | Freq: Every day | ORAL | 3 refills | Status: DC

## 2022-04-14 MED FILL — Heparin Sodium (Porcine) Inj 1000 Unit/ML: INTRAMUSCULAR | Qty: 10 | Status: AC

## 2022-04-14 MED FILL — Calcium Chloride Inj 10%: INTRAVENOUS | Qty: 10 | Status: AC

## 2022-04-14 MED FILL — Electrolyte-R (PH 7.4) Solution: INTRAVENOUS | Qty: 3000 | Status: AC

## 2022-04-14 MED FILL — Sodium Chloride IV Soln 0.9%: INTRAVENOUS | Qty: 2000 | Status: AC

## 2022-04-14 MED FILL — Sodium Bicarbonate IV Soln 8.4%: INTRAVENOUS | Qty: 50 | Status: AC

## 2022-04-14 MED FILL — Mannitol IV Soln 20%: INTRAVENOUS | Qty: 500 | Status: AC

## 2022-04-14 NOTE — Progress Notes (Signed)
? ?   ?  [address] ?      [place] [address] ?            [phone]   ? ?  ?7 Days Post-Op Procedure(s) (LRB): ?AORTIC VALVE REPLACEMENT (AVR), USING INSPIRIS 19 MM AORTIC VALVE (N/A) ?TRANSESOPHAGEAL ECHOCARDIOGRAM (TEE) (N/A) ? ?Subjective: ? ?Sitting up on side of bed eating breakfast.  She has no complaints, feels good about going home.  She is ambulating without difficulty ? ?Objective: ?Vital signs in last 24 hours: ?Temp:  [97.6 ?F (36.4 ?C)-98.5 ?F (36.9 ?C)] 98.4 ?F (36.9 ?C) (05/04 0701) ?Pulse Rate:  [99-116] 109 (05/04 0701) ?Cardiac Rhythm: Ventricular paced (05/03 1900) ?Resp:  [14-20] 20 (05/04 0701) ?BP: (102-132)/(50-98) 106/54 (05/04 0701) ?SpO2:  [90 %-96 %] 93 % (05/04 0701) ?Weight:  [57.2 kg] 57.2 kg (05/04 0500) ? ?Intake/Output from previous day: ?05/03 0701 - 05/04 0700 ?In: 240 [P.O.:240] ?Out: -  ? ?General appearance: alert, cooperative, and no distress ?Heart: regular rate and rhythm ?Lungs: clear to auscultation bilaterally ?Abdomen: soft, non-tender; bowel sounds normal; no masses,  no organomegaly ?Extremities: edema trace  ?Wound: clean and dry ? ?Lab Results: ?Recent Labs  ?  04/12/22 ?0155  ?WBC 14.7*  ?HGB 9.3*  ?HCT 27.9*  ?PLT 199  ? ?BMET:  ?Recent Labs  ?  04/12/22 ?0155 04/14/22 ?0559  ?NA 126* 132*  ?K 4.6 3.8  ?CL 88* 86*  ?CO2 28 36*  ?GLUCOSE 145* 121*  ?BUN 27* 29*  ?CREATININE 1.24* 1.25*  ?CALCIUM 8.7* 8.8*  ?  ?PT/INR:  ?Recent Labs  ?  04/14/22 ?0559  ?LABPROT 14.0  ?INR 1.1  ? ?ABG ?   ?Component Value Date/Time  ? PHART 7.368 04/08/2022 0229  ? HCO3 24.8 04/08/2022 0229  ? TCO2 26 04/08/2022 0229  ? ACIDBASEDEF 2.0 04/07/2022 0947  ? O2SAT 100 04/08/2022 0229  ? ?CBG (last 3)  ?Recent Labs  ?  04/13/22 ?1613 04/13/22 ?2104 04/14/22 ?6834  ?GLUCAP 159* 134* 130*  ? ? ?Assessment/Plan: ?S/P Procedure(s) (LRB): ?AORTIC VALVE REPLACEMENT (AVR), USING INSPIRIS 19 MM AORTIC VALVE (N/A) ?TRANSESOPHAGEAL ECHOCARDIOGRAM (TEE) (N/A) ? ?CV- Sinus Tach  with V pacing, atrial sensing- continue Amiodarone, Lopressor ?INR 1.1, still hasn't bumped will continue 5 mg daily, plan for INR check Monday ?Renal- creatinine stable at 1.25,. remains volume overloaded will continue diuretic for 7 days at discharge ?Hyponatremia- improving, up to 132 ?DM- sugars remain controlled ?Dispo- patient stable, will d/c home today. ? ? LOS: 7 days  ? ?Ellwood Handler, PA-C ?04/14/2022 ? ? ?

## 2022-04-14 NOTE — TOC Transition Note (Signed)
Transition of Care (TOC) - CM/SW Discharge Note ?Marvetta Gibbons Therapist, sports, BSN ?Transitions of Care ?Unit 4E- RN Case Manager ?See Treatment Team for direct phone #  ? ? ?Patient Details  ?Name: Patricia [name] ?MRN: [id number] ?Date of Birth: 05-28-1951 ? ?Transition of Care (TOC) CM/SW Contact:  ?Dahlia Client, Romeo Rabon, RN ?Phone Number: ?04/14/2022, 9:50 AM ? ? ?Clinical Narrative:    ?Pt stable for transition home today, RW delivery still pending, call made to [employer] and spoke with Frazier Butt who states it should be delivered this am. [facility] is set up with [facility]- they will call within 48 hr post discharge to schedule start of care visit.  ? ?Family to transport home. ? ?No Further TOC needs noted.  ? ? ?Final next level of care: [place] ?Barriers to Discharge: Barriers Resolved ? ? ?Patient Goals and CMS Choice ?Patient states their goals for this hospitalization and ongoing recovery are:: return home ?CMS Medicare.gov Compare Post Acute Care list provided to:: Patient ?Choice offered to / list presented to : Patient ? ?Discharge Placement ?  ?           ? Home w/ HH ?  ?  ?  ? ?Discharge Plan and Services ?  ?Discharge Planning Services: CM Consult ?Post Acute Care Choice: Durable Medical Equipment, Home Health          ?DME Arranged: Walker rolling ?DME Agency: [facility] ?Date DME Agency Contacted: 04/13/22 ?Time DME Agency Contacted: [phone] ?Representative spoke with at DME Agency: Delana Meyer ?HH Arranged: PT ?[place] Agency: [place] ?Date HH Agency Contacted: 04/13/22 ?Time [place]: 9371 ?Representative spoke with at [place]: Tommi Rumps ? ?Social Determinants of Health (SDOH) Interventions ?  ? ? ?Readmission Risk Interventions ? ?  04/14/2022  ?  9:50 AM  ?Readmission Risk Prevention Plan  ?Transportation Screening Complete  ?PCP or Specialist Appt within 3-5 Days Complete  ?[place] or Home Care Consult Complete  ?Palliative Care Screening Not Applicable  ?Medication Review Press photographer)  Complete  ? ? ? ? ? ?

## 2022-04-14 NOTE — Progress Notes (Signed)
CARDIAC REHAB PHASE I  ? ?D/c education completed with pt and family. Pt educated on importance of site care and monitoring incisions daily. Encouraged continued IS use, walks, and sternal precautions. Pt given in-the-tube sheet along with heart healthy and diabetic diets. Reviewed restrictions and exercise guidelines. Will refer to CRP II [place]. ? ?[id number] ?Rufina Falco, RN BSN ?04/14/2022 ?10:02 AM ? ?

## 2022-04-14 NOTE — Care Management Important Message (Signed)
Important Message ? ?Patient Details  ?Name: Patricia [name] ?MRN: [id number] ?Date of Birth: [birth year] ? ? ?Medicare Important Message Given:  Yes ? ? ? ? ?Shelda Altes ?04/14/2022, 8:46 AM ?

## 2022-04-14 NOTE — Progress Notes (Signed)
Patient discharging home with support of family/friends. Sutures removed without complications per order. IV removed without complications. Tele removed and CCMD notified. Discharge instructions given and medication administration discussed. All questions answered. ?Patricia [name]  ?

## 2022-04-16 DIAGNOSIS — Z48812 Encounter for surgical aftercare following surgery on the circulatory system: Secondary | ICD-10-CM | POA: Diagnosis not present

## 2022-04-18 ENCOUNTER — Other Ambulatory Visit: Payer: Self-pay

## 2022-04-18 ENCOUNTER — Ambulatory Visit (INDEPENDENT_AMBULATORY_CARE_PROVIDER_SITE_OTHER): Payer: Medicare PPO

## 2022-04-18 DIAGNOSIS — Z7901 Long term (current) use of anticoagulants: Secondary | ICD-10-CM

## 2022-04-18 DIAGNOSIS — Z952 Presence of prosthetic heart valve: Secondary | ICD-10-CM

## 2022-04-18 LAB — POCT INR: INR: 2.5 (ref 2.0–3.0)

## 2022-04-18 NOTE — Addendum Note (Signed)
Addendum  created 04/18/22 1215 by Effie Berkshire, MD  ? Clinical Note Signed, Intraprocedure Blocks edited, SmartForm saved  ?  ?

## 2022-04-18 NOTE — Patient Instructions (Signed)
TAKE 1 TABLET DAILY;  Be consistent with [place];  INR in 1 week ? ?A full discussion of the nature of anticoagulants has been carried out.  A benefit risk analysis has been presented to the patient, so that they understand the justification for choosing anticoagulation at this time. The need for frequent and regular monitoring, precise dosage adjustment and compliance is stressed.  Side effects of potential bleeding are discussed.  The patient should avoid any OTC items containing aspirin or ibuprofen, and should avoid great swings in general diet.  Avoid alcohol consumption.  Call if any signs of abnormal bleeding.  [phone] ? ?

## 2022-04-19 ENCOUNTER — Telehealth: Payer: Self-pay

## 2022-04-19 NOTE — Telephone Encounter (Signed)
Patricia [name], PT with [facility] contacted the office [phone] requesting verbal orders for home health physical therapy s/p surgery. VO given for PT once a week for 1 week and 2 times a week for 4 wks. To help with balance, strengths, and transfers. Will await faxed copy for physician signature. ?

## 2022-04-20 ENCOUNTER — Other Ambulatory Visit: Payer: Self-pay | Admitting: Physician Assistant

## 2022-04-21 ENCOUNTER — Telehealth: Payer: Self-pay

## 2022-04-21 NOTE — Telephone Encounter (Signed)
Patient contacted the office stating that she was out of Demadex. She was discharged home from the hospital with 7 days worth of the RX. Advised that a weeks worth of the medication was all she needed at this time. She states that she was taking Lasix at home and inquired if she should continue that back. Patient is below her discharge weight(125#) of 116#. Spoke with Ellwood Handler, PA whom states that patient should weight herself daily and only take the Lasix/K+ once daily as needed. She understood instructions and acknowledged receipt.  ?

## 2022-04-25 NOTE — Progress Notes (Signed)
?Cardiology Office Note:   ? ?Date:  04/26/2022  ? ?ID:  Patricia [name], DOB 1951-08-11, MRN [id number] ? ?PCP:  Susy Frizzle, MD ?[place] Cardiologist: Minus Breeding, MD  ? ?Reason for visit: Hospital follow-up ? ?History of Present Illness:   ? ?Patricia [name] is a 71 y.o. female with a hx of mitral stenosis status post MV replacement with 25 mm Medtronic Mosaic porcine valve in October 2018, atrial flutter ablation, NSVT, symptomatic complete heart block status post pacemaker, severe aortic insufficiency.   Valve disease thought secondary to radiation treatment for Hodgkin's lymphoma in the past.   Cardiac catheterization in 2018 showed minimal irregularity in the left circumflex.  Gated cardiac CTA showed no significant coronary disease.  ? ?She last saw Dr. Percival Spanish in February 2023.  He noted patient had lower extremity edema and orthopnea in January. ? ?With exertional fatigue and shortness of breath, she proceeded with aortic valve replacement.  She had postop atrial flutter.  EP consulted and adjusted her device settings.  She was started on anticoagulation, goal INR 2-3.  She was diuresis Demadex.  Chlorthalidone $RemoveBeforeD'25mg'[id number]$  and Lasix $RemoveB'40mg'FxMiypJc$  were discontinued.  She was discharged on torsemide 40 mg daily. ? ?Today, she states she is doing better.  The shortness of breath she had before valve surgery has improved.  She still feels weak and has throat soreness post intubation.  She has trouble swallowing the potassium tablet and asks for other options.  She finished torsemide after 7 days post discharge.  Her previous dry weight was 110-112 pounds.  Her current weight is 116 pounds by her scale.  She states her lower extremity edema has improved since discharge (particularly after a cabbage saran wrap on legs x 2 days).  She still has 2-3 pillow orthopnea.  She is able to sleep well most nights.  She denies bloating.   ? ?She states she has a long history of paroxysmal atrial flutter.  Her  symptoms of atrial flutter are palpitations, fatigue, shortness of breath and weakness.  She states she has had 1 very brief episode post hospital discharge.  She is on Coumadin with no bleeding.   ? ?PT is coming to the house twice a week.  She did a thousand steps yesterday.  She is scheduled for her cardiac rehab info session on May 24 and has her first physical session on June 1. ? ?  ?Past Medical History:  ?Diagnosis Date  ? Atrial flutter ([place])   ? Ablated 1998 Dr. Caryl Comes  ? Basal cell carcinoma 06/05/2019  ? R low back, [facility] 07/16/19  ? Basal cell carcinoma 04/15/2010  ? Right post shoulder  ? Basal cell carcinoma 04/15/2010  ? Mid back  ? Basal cell carcinoma (BCC) 09/29/2016  ? Left post shoulder. Superficial and nodular.  ? BCC (basal cell carcinoma) 07/21/2021  ? right mid back, [facility] 09/15/2021  ? BCC (basal cell carcinoma) 01/27/2022  ? left lower abdomen  ? Bleeding gastric ulcer   ? back in 2000  ? Breast cancer ([place])   ? COPD (chronic obstructive pulmonary disease) ([place])   ? from radiation  ? GERD (gastroesophageal reflux disease)   ? History of basal cell carcinoma (BCC) 03/22/2021  ? right neck and right medial ear, Moh's  ? Hodgkin's lymphoma ([place])   ? Treated with radiation and chemo  ? Mitral regurgitation   ? pvc  ? Osteoporosis   ? lumbar verterbral fracture, left ankle fracture, dexa 2015  ?  Pancreatitis   ? Pneumonia   ? Prediabetes   ? Presence of permanent cardiac pacemaker   ? 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 25 mm Medtronic Mosaic porcine stented bioprosthetic tissue valve  ? 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 Left medial lower leg  ? Ulcer   ? ? ?Past Surgical History:  ?Procedure Laterality Date  ? AORTIC VALVE REPLACEMENT N/A 04/07/2022  ? Procedure: AORTIC VALVE REPLACEMENT (AVR), USING INSPIRIS 19 MM AORTIC VALVE;  Surgeon: [name], MD;  Location: [facility] OR;  Service: Open Heart Surgery;  Laterality: N/A;  ? BREAST  SURGERY    ? breast cancer since 2005   left mastectomy  ? BUBBLE STUDY  01/12/2022  ? Procedure: BUBBLE STUDY;  Surgeon: Geralynn Rile, MD;  Location: [facility];  Service: Cardiovascular;;  ? CARDIAC ELECTROPHYSIOLOGY [place]  ? CARDIAC VALVE REPLACEMENT N/A   ? Phreesia 03/19/2021  ? CATARACT EXTRACTION, BILATERAL    ? COLONOSCOPY    ? IR RADIOLOGY PERIPHERAL GUIDED IV START  08/03/2017  ? IR US GUIDE VASC ACCESS RIGHT  08/03/2017  ? LAPAROTOMY    ? MASTECTOMY  2005  ? Left-axillary  ? MITRAL VALVE REPAIR Right 09/13/2017  ? Procedure: MINIMALLY INVASIVE MITRAL VALVE REPLACEMENT (MVR) with Medtronic Mosaic Porcine Heart Valve size 77mm;  Surgeon: Rexene Alberts, MD;  Location: [facility];  Service: Open Heart Surgery;  Laterality: Right;  ? ORIF ANKLE FRACTURE Left 03/21/2014  ? Procedure: LEFT ANKLE FRACTURE OPEN TREATMENT BILMALLEOLAR INCLUDES INTERNAL FIXATION ;  Surgeon: Renette Butters, MD;  Location: [place];  Service: Orthopedics;  Laterality: Left;  ? PACEMAKER IMPLANT N/A 09/19/2017  ? Medtronic Azure XT MRI conditional dual-chamber pacemaker for symptomatic complete heart block  by Dr Rayann Heman  ? RIGHT/LEFT HEART CATH AND CORONARY ANGIOGRAPHY N/A 06/27/2017  ? Procedure: Right/Left Heart Cath and Coronary Angiography;  Surgeon: Larey [name], MD;  Location: [facility];  Service: Cardiovascular;  Laterality: N/A;  ? SPLENECTOMY  1974  ? hodgkins disease  ? TEE WITHOUT CARDIOVERSION  06/12/2012  ? Procedure: TRANSESOPHAGEAL ECHOCARDIOGRAM (TEE);  Surgeon: Larey [name], MD;  Location: [facility];  Service: Cardiovascular;  Laterality: N/A;  ? TEE WITHOUT CARDIOVERSION N/A 06/29/2017  ? Procedure: TRANSESOPHAGEAL ECHOCARDIOGRAM (TEE);  Surgeon: Jerline Pain, MD;  Location: [facility] ENDOSCOPY;  Service: Cardiovascular;  Laterality: N/A;  ? TEE WITHOUT CARDIOVERSION N/A 09/13/2017  ? Procedure: TRANSESOPHAGEAL ECHOCARDIOGRAM (TEE);  Surgeon: Rexene Alberts, MD;   Location: [facility];  Service: Open Heart Surgery;  Laterality: N/A;  ? TEE WITHOUT CARDIOVERSION N/A 01/12/2022  ? Procedure: TRANSESOPHAGEAL ECHOCARDIOGRAM (TEE);  Surgeon: Geralynn Rile, MD;  Location: [facility];  Service: Cardiovascular;  Laterality: N/A;  ? TEE WITHOUT CARDIOVERSION N/A 04/07/2022  ? Procedure: TRANSESOPHAGEAL ECHOCARDIOGRAM (TEE);  Surgeon: [name], MD;  Location: [facility];  Service: Open Heart Surgery;  Laterality: N/A;  ? TONSILLECTOMY AND ADENOIDECTOMY    ? ? ?Current Medications: ?Current Meds  ?Medication Sig  ? acetaminophen (TYLENOL) 325 MG tablet Take 2 tablets (650 mg total) by mouth 2 (two) times daily as needed for moderate pain or headache.  ? alendronate (FOSAMAX) 70 MG tablet Take 1 tablet (70 mg total) by mouth once a week. (Patient taking differently: Take 70 mg by mouth once a week. Sunday)  ? amiodarone (PACERONE) 200 MG tablet Take 2 tablets (400 mg total) by  mouth 2 (two) times daily. X 3 days, then decrease to 200 mg BID x 7 days, then decrease to 200 mg daily (Patient taking differently: Take 400 mg by mouth daily. Take 1 Tablet Daily.)  ? atorvastatin (LIPITOR) 20 MG tablet Take 1 tablet (20 mg total) by mouth daily.  ? Blood Glucose Monitoring Suppl (ACCU-CHEK GUIDE) w/Device KIT 1 Units by Does not apply route daily.  ? Calcium Carbonate (CALCIUM 600 PO) Take 600 mg by mouth 2 (two) times daily. Lunch and Dinner  ? CREON 36000-114000 units CPEP capsule Take 38,381-84,037 Units by mouth See admin instructions. Take 72,000 in the morning and 36,000 for lunch and dinner  ? esomeprazole (NEXIUM) 40 MG capsule Take 40 mg by mouth daily before breakfast.   ? fluticasone (FLONASE) 50 MCG/ACT nasal spray Place 2 sprays into both nostrils daily. (Patient taking differently: Place 2 sprays into both nostrils daily as needed for rhinitis.)  ? furosemide (LASIX) 40 MG tablet Take 40 mg by mouth. Take 1 Tablet Daily. Can Take Additional Tablet for Weight Gain 3 pound in a  Day and 5 pounds in a Week.  ? glucose blood (ACCU-CHEK GUIDE) test strip USE TO TEST BLOOD SUGARS EVERY MORNING  ? Lancets (ACCU-CHEK SOFT TOUCH) lancets Check BS QAM  ? Lancets Misc. (ACCU-CHEK SOFTCLIX LANCE

## 2022-04-25 NOTE — Progress Notes (Signed)
Electrophysiology Office Note Date: 05/02/2022  ID:  Patricia, [name] [birth year], MRN [id number]  PCP: Susy Frizzle, MD Primary Cardiologist: Minus Breeding, MD Electrophysiologist: Cristopher Peru, MD   CC: Pacemaker follow-up  Patricia [name] is a 71 y.o. female seen today for Cristopher Peru, MD for routine electrophysiology followup.  Since discharge from hospital from her AVR the patient reports doing well. She would like to try and come off amiodarone if she can. It has caused hair loss in the past.  she denies chest pain, palpitations, dyspnea, PND, orthopnea, nausea, vomiting, dizziness, syncope, edema, weight gain, or early satiety.  Device History: Medtronic Dual Chamber PPM implanted 2018 for CHB  Past Medical History:  Diagnosis Date   Atrial flutter (HCC)    Ablated 1998 Dr. Caryl Comes   Basal cell carcinoma 06/05/2019   R low back, [facility] 07/16/19   Basal cell carcinoma 04/15/2010   Right post shoulder   Basal cell carcinoma 04/15/2010   Mid back   Basal cell carcinoma (BCC) 09/29/2016   Left post shoulder. Superficial and nodular.   BCC (basal cell carcinoma) 07/21/2021   right mid back, [facility] 09/15/2021   BCC (basal cell carcinoma) 01/27/2022   left lower abdomen   Bleeding gastric ulcer    back in 2000   Breast cancer ([place])    COPD (chronic obstructive pulmonary disease) ([place])    from radiation   GERD (gastroesophageal reflux disease)    History of basal cell carcinoma (BCC) 03/22/2021   right neck and right medial ear, Moh's   Hodgkin's lymphoma ([place])    Treated with radiation and chemo   Mitral regurgitation    pvc   Osteoporosis    lumbar verterbral fracture, left ankle fracture, dexa 2015   Pancreatitis    Pneumonia    Prediabetes    Presence of permanent cardiac pacemaker    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 09/13/2017   25 mm Medtronic Mosaic porcine stented bioprosthetic tissue valve   Squamous cell  carcinoma in situ 04/15/2010   Left medial lower leg   Ulcer    Past Surgical History:  Procedure Laterality Date   AORTIC VALVE REPLACEMENT N/A 04/07/2022   Procedure: AORTIC VALVE REPLACEMENT (AVR), USING INSPIRIS 19 MM AORTIC VALVE;  Surgeon: [name], MD;  Location: [place];  Service: Open Heart Surgery;  Laterality: N/A;   BREAST SURGERY     breast cancer since 2005   left mastectomy   BUBBLE STUDY  01/12/2022   Procedure: BUBBLE STUDY;  Surgeon: Geralynn Rile, MD;  Location: [place];  Service: Cardiovascular;;   CARDIAC ELECTROPHYSIOLOGY [place] VALVE REPLACEMENT N/A    [employer]reesia 03/19/2021   CATARACT EXTRACTION, BILATERAL     COLONOSCOPY     IR RADIOLOGY PERIPHERAL GUIDED IV START  08/03/2017   IR US GUIDE VASC ACCESS RIGHT  08/03/2017   LAPAROTOMY     MASTECTOMY  2005   Left-axillary   MITRAL VALVE REPAIR Right 09/13/2017   Procedure: MINIMALLY INVASIVE MITRAL VALVE REPLACEMENT (MVR) with Medtronic Mosaic Porcine Heart Valve size 64mm;  Surgeon: Rexene Alberts, MD;  Location: [place];  Service: Open Heart Surgery;  Laterality: Right;   ORIF ANKLE FRACTURE Left 03/21/2014   Procedure: LEFT ANKLE FRACTURE OPEN TREATMENT BILMALLEOLAR INCLUDES INTERNAL FIXATION ;  Surgeon: Renette Butters, MD;  Location: [place];  Service: Orthopedics;  Laterality: Left;   PACEMAKER  IMPLANT N/A 09/19/2017   Medtronic Azure XT MRI conditional dual-chamber pacemaker for symptomatic complete heart block  by Dr Rayann Heman   RIGHT/LEFT HEART CATH AND CORONARY ANGIOGRAPHY N/A 06/27/2017   Procedure: Right/Left Heart Cath and Coronary Angiography;  Surgeon: Larey [name], MD;  Location: [place] [facility];  Service: Cardiovascular;  Laterality: N/A;   SPLENECTOMY  1974   hodgkins disease   TEE WITHOUT CARDIOVERSION  06/12/2012   Procedure: TRANSESOPHAGEAL ECHOCARDIOGRAM (TEE);  Surgeon: Larey [name], MD;  Location: [place];  Service:  Cardiovascular;  Laterality: N/A;   TEE WITHOUT CARDIOVERSION N/A 06/29/2017   Procedure: TRANSESOPHAGEAL ECHOCARDIOGRAM (TEE);  Surgeon: Jerline Pain, MD;  Location[facility] ENDOSCOPY;  Service: Cardiovascular;  Laterality: N/A;   TEE WITHOUT CARDIOVERSION N/A 09/13/2017   Procedure: TRANSESOPHAGEAL ECHOCARDIOGRAM (TEE);  Surgeon: Rexene Alberts, MD;  Location: [place];  Service: Open Heart Surgery;  Laterality: N/A;   TEE WITHOUT CARDIOVERSION N/A 01/12/2022   Procedure: TRANSESOPHAGEAL ECHOCARDIOGRAM (TEE);  Surgeon: Geralynn Rile, MD;  Location: [place];  Service: Cardiovascular;  Laterality: N/A;   TEE WITHOUT CARDIOVERSION N/A 04/07/2022   Procedure: TRANSESOPHAGEAL ECHOCARDIOGRAM (TEE);  Surgeon: [name], MD;  Location: [place];  Service: Open Heart Surgery;  Laterality: N/A;   TONSILLECTOMY AND ADENOIDECTOMY      Current Outpatient Medications  Medication Sig Dispense Refill   acetaminophen (TYLENOL) 325 MG tablet Take 2 tablets (650 mg total) by mouth 2 (two) times daily as needed for moderate pain or headache.     alendronate (FOSAMAX) 70 MG tablet Take 1 tablet (70 mg total) by mouth once a week. (Patient taking differently: Take 70 mg by mouth once a week. Sunday) 12 tablet 3   amiodarone (PACERONE) 200 MG tablet Take 2 tablets (400 mg total) by mouth 2 (two) times daily. X 3 days, then decrease to 200 mg BID x 7 days, then decrease to 200 mg daily (Patient taking differently: Take 400 mg by mouth daily. Take 1 Tablet Daily.) 90 tablet 1   atorvastatin (LIPITOR) 20 MG tablet Take 1 tablet (20 mg total) by mouth daily. 30 tablet 3   Blood Glucose Monitoring Suppl (ACCU-CHEK GUIDE) w/Device KIT 1 Units by Does not apply route daily. 1 kit 0   Calcium Carbonate (CALCIUM 600 PO) Take 600 mg by mouth 2 (two) times daily. Lunch and Dinner     CREON 36000-114000 units CPEP capsule Take 23,762-83,151 Units by mouth See admin instructions. Take 72,000 in the morning and 36,000 for  lunch and dinner     esomeprazole (NEXIUM) 40 MG capsule Take 40 mg by mouth daily before breakfast.   12   fluticasone (FLONASE) 50 MCG/ACT nasal spray Place 2 sprays into both nostrils daily. (Patient taking differently: Place 2 sprays into both nostrils daily as needed for rhinitis.) 16 g 6   furosemide (LASIX) 40 MG tablet Take 40 mg by mouth. Take 1 Tablet Daily. Can Take Additional Tablet for Weight Gain 3 pound in a Day and 5 pounds in a Week.     glucose blood (ACCU-CHEK GUIDE) test strip USE TO TEST BLOOD SUGARS EVERY MORNING 100 each 3   Lancets (ACCU-CHEK SOFT TOUCH) lancets Check BS QAM 100 each 3   Lancets Misc. (ACCU-CHEK SOFTCLIX LANCET DEV) KIT Check BS QAM 1 kit 0   levothyroxine (SYNTHROID) 50 MCG tablet TAKE 1 TABLET BY MOUTH ONCE A DAY 90 tablet 3   metoprolol tartrate (LOPRESSOR) 25 MG tablet Take 1 tablet (  25 mg total) by mouth 2 (two) times daily. 60 tablet 3   Multiple Vitamin (MULTIVITAMIN WITH MINERALS) TABS tablet Take 1 tablet by mouth daily with lunch. Centrum Silver.     potassium chloride SA (KLOR-CON M) 20 MEQ tablet TAKE 1 TABLET BY MOUTH ONCE A DAY (Patient taking differently: Take 20 mEq by mouth daily. Take additional 20 mg tablet with take an additional fursomide) 90 tablet 1   traMADol (ULTRAM) 50 MG tablet Take 1 tablet (50 mg total) by mouth every 6 (six) hours as needed for moderate pain. 28 tablet 0   warfarin (COUMADIN) 5 MG tablet Take 1 tablet (5 mg total) by mouth daily. (Patient taking differently: Take 5 mg by mouth daily. Pt takes 1/2 tablet on Mondays, Wednesdays and Fridays. 1 tablet on Tuesdays, Thursdays, Saturdays, and Sundays.) 30 tablet 3   potassium chloride (KLOR-CON) 20 MEQ packet Take 20 mEq by mouth once for 1 dose. 1 packet 3   No current facility-administered medications for this visit.    Allergies:   Aspirin, Azithromycin, Penicillins, Sulfa antibiotics, and Cheese   Social History: Social History   Socioeconomic History    Marital status: Widowed    Spouse name: Not on file   Number of children: Not on file   Years of education: Not on file   Highest education level: Master's degree (e.g., MA, MS, MEng, MEd, MSW, MBA)  Occupational History   Occupation: Pharmacist, hospital (retired)  Tobacco Use   Smoking status: Never   Smokeless tobacco: Never  Vaping Use   Vaping Use: Never used  Substance and Sexual Activity   Alcohol use: No   Drug use: No   Sexual activity: Yes  Other Topics Concern   Not on file  Social History Narrative   Husband passed away from [place] in 17-Sep-2017.   Social Determinants of Health   Financial Resource Strain: Not on file  Food Insecurity: Not on file  Transportation Needs: Not on file  Physical Activity: Not on file  Stress: Not on file  Social Connections: Not on file  Intimate Partner Violence: Not on file    Family History: Family History  Problem Relation Age of Onset   Rheumatic fever Father        Died suddenly age 50   Diabetes Father    Cancer Father        colon   Heart disease Father        rheumatic fever x 3   Arthritis Maternal Grandmother    Hypertension Maternal Grandmother    Diabetes Paternal Grandmother    Vision loss Paternal Grandmother        glaucoma     Review of Systems: All other systems reviewed and are otherwise negative except as noted above.  Physical Exam: Vitals:   05/02/22 0931  BP: 128/70  Pulse: (!) 51  SpO2: 99%  Weight: 120 lb 3.2 oz (54.5 kg)  Height: $Remove'5\' 6"'QRasQrL$  (1.676 m)     GEN- The patient is well appearing, alert and oriented x 3 today.   HEENT: normocephalic, atraumatic; sclera clear, conjunctiva pink; hearing intact; oropharynx clear; neck supple  Lungs- Clear to ausculation bilaterally, normal work of breathing.  No wheezes, rales, rhonchi Heart- Regular rate and rhythm, no murmurs, rubs or gallops  GI- soft, non-tender, non-distended, bowel sounds present  Extremities- no clubbing or cyanosis. No edema MS- no  significant deformity or atrophy Skin- warm and dry, no rash or lesion; PPM pocket well healed Psych-  euthymic mood, full affect Neuro- strength and sensation are intact  PPM Interrogation- reviewed in detail today,  See PACEART report  EKG:  EKG is not ordered today.  Recent Labs: 12/17/2021: Brain Natriuretic Peptide 229 04/08/2022: ALT 25; Magnesium 3.2 04/12/2022: Hemoglobin 9.3; Platelets 199 04/26/2022: BUN 24; Creatinine, Ser 1.21; Potassium 4.3; Sodium 138   Wt Readings from Last 3 Encounters:  05/02/22 120 lb 3.2 oz (54.5 kg)  04/26/22 118 lb 9.6 oz (53.8 kg)  04/14/22 126 lb 1.7 oz (57.2 kg)     Other studies Reviewed: Additional studies/ records that were reviewed today include: Previous EP office notes, Previous remote checks, Most recent labwork.   Assessment and Plan:  1. CHB s/p Medtronic PPM  Normal PPM function See Pace Art report No changes today  2. PAF/ AT [place] 0.5% by device Aggravated post valve replacement On coumadin  Decrease amiodarone to 100 mg daily CHA2DS2VASC is at least 3.  3. HTN Stable on current regimen   Current medicines are reviewed at length with the patient today.     Disposition:   Follow up with Dr. Lovena Le 6-8 weeks to consider coming off amiodarone pending AF burden. Will need to continue coumadin for the time being.    Jacalyn Lefevre, PA-C  05/02/2022 9:56 AM  [facility] [address] [place] [address] [phone] (office) [phone] (fax)

## 2022-04-26 ENCOUNTER — Ambulatory Visit: Payer: Medicare PPO | Admitting: Physician Assistant

## 2022-04-26 ENCOUNTER — Telehealth: Payer: Self-pay | Admitting: Physician Assistant

## 2022-04-26 ENCOUNTER — Ambulatory Visit (INDEPENDENT_AMBULATORY_CARE_PROVIDER_SITE_OTHER): Payer: Medicare PPO | Admitting: Pharmacist Clinician (PhC)/ Clinical Pharmacy Specialist

## 2022-04-26 ENCOUNTER — Encounter: Payer: Self-pay | Admitting: Physician Assistant

## 2022-04-26 VITALS — BP 138/86 | HR 75 | Ht 66.0 in | Wt 118.6 lb

## 2022-04-26 DIAGNOSIS — Z7901 Long term (current) use of anticoagulants: Secondary | ICD-10-CM

## 2022-04-26 DIAGNOSIS — Z952 Presence of prosthetic heart valve: Secondary | ICD-10-CM

## 2022-04-26 DIAGNOSIS — I442 Atrioventricular block, complete: Secondary | ICD-10-CM | POA: Diagnosis not present

## 2022-04-26 DIAGNOSIS — Z9889 Other specified postprocedural states: Secondary | ICD-10-CM | POA: Diagnosis not present

## 2022-04-26 DIAGNOSIS — I10 Essential (primary) hypertension: Secondary | ICD-10-CM

## 2022-04-26 LAB — BASIC METABOLIC PANEL
BUN/Creatinine Ratio: 20 (ref 12–28)
BUN: 24 mg/dL (ref 8–27)
CO2: 28 mmol/L (ref 20–29)
Calcium: 9.6 mg/dL (ref 8.7–10.3)
Chloride: 93 mmol/L — ABNORMAL LOW (ref 96–106)
Creatinine, Ser: 1.21 mg/dL — ABNORMAL HIGH (ref 0.57–1.00)
Glucose: 110 mg/dL — ABNORMAL HIGH (ref 70–99)
Potassium: 4.3 mmol/L (ref 3.5–5.2)
Sodium: 138 mmol/L (ref 134–144)
eGFR: 48 mL/min/{1.73_m2} — ABNORMAL LOW (ref >59)

## 2022-04-26 LAB — POCT INR: INR: 4.6 — AB (ref 2.0–3.0)

## 2022-04-26 MED ORDER — POTASSIUM CHLORIDE 20 MEQ PO PACK: 20.0000 meq | PACK | Freq: Once | ORAL | 3 refills | Status: DC

## 2022-04-26 NOTE — Telephone Encounter (Signed)
Called pharmacy with clarification for prescription for Potassium packet. ?

## 2022-04-26 NOTE — Telephone Encounter (Signed)
Spoke to pharmacist with [facility] she needs clarification on potassium packet prescription sent in yesterday.Stated she cannot dispense 1 packet.Advised I will send message to Caron Presume PA. ?

## 2022-04-26 NOTE — Patient Instructions (Signed)
Medication Instructions:  ?Restart Lasix 40 mg ( 1 Tablet Daily). ?Decrease Amiodarone 200 mg ( Take 1 Tablet Daily). ?*If you need a refill on your cardiac medications before your next appointment, please call your pharmacy* ? ? ?Lab Work: ?BMET : Today ?If you have labs (blood work) drawn today and your tests are completely normal, you will receive your results only by: ?MyChart Message (if you have MyChart) OR ?A paper copy in the mail ?If you have any lab test that is abnormal or we need to change your treatment, we will call you to review the results. ? ? ?Testing/Procedures: ?No Testing ? ? ?Follow-Up: ?At [facility], you and your health needs are our priority.  As part of our continuing mission to provide you with exceptional heart care, we have created designated Provider Care Teams.  These Care Teams include your primary Cardiologist (physician) and Advanced Practice Providers (APPs -  Physician Assistants and Nurse Practitioners) who all work together to provide you with the care you need, when you need it. ? ?We recommend signing up for the patient portal called "MyChart".  Sign up information is provided on this After Visit Summary.  MyChart is used to connect with patients for Virtual Visits (Telemedicine).  Patients are able to view lab/test results, encounter notes, upcoming appointments, etc.  Non-urgent messages can be sent to your provider as well.   ?To learn more about what you can do with MyChart, go to [email or web address].   ? ?Your next appointment:   ?3 month(s) ? ?The format for your next appointment:   ?In Person ? ?Provider:   ?Minus Breeding, MD   ? ? ? ? ?Important Information About Sugar ? ? ? ? ? ? ?

## 2022-04-26 NOTE — Telephone Encounter (Signed)
?  Pt c/o medication issue: ? ?1. Name of Medication: potassium chloride (KLOR-CON) 20 MEQ packet ? ?2. How are you currently taking this medication (dosage and times per day)? Take 20 mEq by mouth once for 1 dose. 1 packet ? ?3. Are you having a reaction (difficulty breathing--STAT)?  ? ?4. What is your medication issue? [place] with [facility] calling they would like to clarify quantity of this prescription. She said they distributed it per pack and cant break it to give pt only 1 packet ?

## 2022-04-26 NOTE — Patient Instructions (Signed)
No warfarin Tuesday May 16 or Wednesday May 17, then change dose to 1 tablet daily except 1/2 tablet on Mondays, Wednesdays and Fridays.  Repeat INR Monday  (note amiodarone dose decreased to 200 mg on 5/16) ?

## 2022-05-02 ENCOUNTER — Ambulatory Visit (INDEPENDENT_AMBULATORY_CARE_PROVIDER_SITE_OTHER): Payer: Medicare PPO

## 2022-05-02 ENCOUNTER — Ambulatory Visit (INDEPENDENT_AMBULATORY_CARE_PROVIDER_SITE_OTHER): Payer: Medicare PPO | Admitting: Student

## 2022-05-02 ENCOUNTER — Encounter: Payer: Self-pay | Admitting: Student

## 2022-05-02 VITALS — BP 128/70 | HR 51 | Ht 66.0 in | Wt 120.2 lb

## 2022-05-02 DIAGNOSIS — I480 Paroxysmal atrial fibrillation: Secondary | ICD-10-CM

## 2022-05-02 DIAGNOSIS — Z7901 Long term (current) use of anticoagulants: Secondary | ICD-10-CM

## 2022-05-02 DIAGNOSIS — Z952 Presence of prosthetic heart valve: Secondary | ICD-10-CM

## 2022-05-02 DIAGNOSIS — I442 Atrioventricular block, complete: Secondary | ICD-10-CM | POA: Diagnosis not present

## 2022-05-02 DIAGNOSIS — I10 Essential (primary) hypertension: Secondary | ICD-10-CM

## 2022-05-02 LAB — CUP PACEART INCLINIC DEVICE CHECK
Battery Remaining Longevity: 71 mo
Battery Voltage: 2.97 V
Brady Statistic AP VP Percent: 3.84 %
Brady Statistic AP VS Percent: 0 %
Brady Statistic AS VP Percent: 96.16 %
Brady Statistic AS VS Percent: 0 %
Brady Statistic RA Percent Paced: 3.9 %
Brady Statistic RV Percent Paced: 100 %
Date Time Interrogation Session: 20230522095600
Implantable Lead Implant Date: 20181009
Implantable Lead Implant Date: 20181009
Implantable Lead Location: 753859
Implantable Lead Location: 753860
Implantable Lead Model: 5076
Implantable Lead Model: 5076
Implantable Pulse Generator Implant Date: 20181009
Lead Channel Impedance Value: 247 Ohm
Lead Channel Impedance Value: 304 Ohm
Lead Channel Impedance Value: 399 Ohm
Lead Channel Impedance Value: 494 Ohm
Lead Channel Pacing Threshold Amplitude: 0.75 V
Lead Channel Pacing Threshold Amplitude: 0.875 V
Lead Channel Pacing Threshold Pulse Width: 0.4 ms
Lead Channel Pacing Threshold Pulse Width: 0.4 ms
Lead Channel Sensing Intrinsic Amplitude: 2.5 mV
Lead Channel Sensing Intrinsic Amplitude: 2.625 mV
Lead Channel Sensing Intrinsic Amplitude: 5.25 mV
Lead Channel Setting Pacing Amplitude: 2 V
Lead Channel Setting Pacing Amplitude: 2.5 V
Lead Channel Setting Pacing Pulse Width: 0.4 ms
Lead Channel Setting Sensing Sensitivity: 2 mV

## 2022-05-02 LAB — POCT INR: INR: 2.3 (ref 2.0–3.0)

## 2022-05-02 MED ORDER — AMIODARONE HCL 200 MG PO TABS: 100.0000 mg | ORAL_TABLET | Freq: Every day | ORAL | 3 refills | Status: DC

## 2022-05-02 NOTE — Patient Instructions (Signed)
Continue 1 tablet daily except 1/2 tablet on Mondays, Wednesdays and Fridays.  Repeat INR 3 weeks

## 2022-05-02 NOTE — Patient Instructions (Signed)
Medication Instructions:  Your physician has recommended you make the following change in your medication:   DECREASE: Amiodarone to '100mg'$  daily  *If you need a refill on your cardiac medications before your next appointment, please call your pharmacy*   Lab Work: None If you have labs (blood work) drawn today and your tests are completely normal, you will receive your results only by: [place] (if you have MyChart) OR A paper copy in the mail If you have any lab test that is abnormal or we need to change your treatment, we will call you to review the results.   Follow-Up: At [facility], you and your health needs are our priority.  As part of our continuing mission to provide you with exceptional heart care, we have created designated Provider Care Teams.  These Care Teams include your primary Cardiologist (physician) and Advanced Practice Providers (APPs -  Physician Assistants and Nurse Practitioners) who all work together to provide you with the care you need, when you need it.  Your next appointment:   6-8 week(s)  The format for your next appointment:   In Person  Provider:   Cristopher Peru, MD{

## 2022-05-03 ENCOUNTER — Telehealth: Payer: Self-pay

## 2022-05-03 NOTE — Telephone Encounter (Addendum)
Called patient regarding results. Patient had understanding of results.----- Message from Warren Lacy, PA-C sent at 05/03/2022 10:12 AM EDT ----- Kidney function stable and potassium normal.  Safe to take Lasix 40 mg daily and take extra tablet for weight gain over 3 pounds in 1 day, shortness of breath or leg swelling.  Take potassium 3mq with lasix dose as instructed.

## 2022-05-03 NOTE — Telephone Encounter (Addendum)
Called patient regarding results.Patient had understanding of results.----- Message from Warren Lacy, PA-C sent at 05/03/2022 10:12 AM EDT ----- Kidney function stable and potassium normal.  Safe to take Lasix 40 mg daily and take extra tablet for weight gain over 3 pounds in 1 day, shortness of breath or leg swelling.  Take potassium 50mq with lasix dose as instructed.

## 2022-05-04 ENCOUNTER — Encounter: Payer: Medicare PPO | Attending: Cardiovascular Disease | Admitting: *Deleted

## 2022-05-04 DIAGNOSIS — Z952 Presence of prosthetic heart valve: Secondary | ICD-10-CM

## 2022-05-04 NOTE — Progress Notes (Signed)
Virtual orientation call completed today. shehas an appointment on Date: 05/12/2022  for EP eval and gym Orientation.  Documentation of diagnosis can be found in [facility] Date: 04/07/2022 .

## 2022-05-11 ENCOUNTER — Ambulatory Visit: Payer: Medicare PPO

## 2022-05-11 NOTE — Progress Notes (Unsigned)
[address]       [place] [address]             [id number]       HPI: This is a 71 year old female well known to [facility] from previous minimally invasive mitral valve repair (size 25 mm) by Dr. Roxy Manns on 09/13/2017, hypothyroidism, a flutter, basal cell skin cancer, COPD, gastric bleeding ulcer, breast cancer, pre diabetes, and Hodgkin's lymphoma who is s/p aortic valve replacement (19 mm) by Dr. Cyndia Bent on 04/07/2022. The patient's early postoperative recovery while in the hospital was notable for a flutter. Since hospital discharge the patient reports ***.   Current Outpatient Medications  Medication Sig Dispense Refill   acetaminophen (TYLENOL) 325 MG tablet Take 2 tablets (650 mg total) by mouth 2 (two) times daily as needed for moderate pain or headache.     alendronate (FOSAMAX) 70 MG tablet Take 1 tablet (70 mg total) by mouth once a week. (Patient taking differently: Take 70 mg by mouth once a week. Sunday) 12 tablet 3   amiodarone (PACERONE) 200 MG tablet Take 0.5 tablets (100 mg total) by mouth daily. 45 tablet 3   atorvastatin (LIPITOR) 20 MG tablet Take 1 tablet (20 mg total) by mouth daily. 30 tablet 3   Blood Glucose Monitoring Suppl (ACCU-CHEK GUIDE) w/Device KIT 1 Units by Does not apply route daily. 1 kit 0   Calcium Carbonate (CALCIUM 600 PO) Take 600 mg by mouth 2 (two) times daily. Lunch and Dinner     CREON 36000-114000 units CPEP capsule Take 87,564-33,295 Units by mouth See admin instructions. Take 72,000 in the morning and 36,000 for lunch and dinner     esomeprazole (NEXIUM) 40 MG capsule Take 40 mg by mouth daily before breakfast.   12   fluticasone (FLONASE) 50 MCG/ACT nasal spray Place 2 sprays into both nostrils daily. (Patient taking differently: Place 2 sprays into both nostrils daily as needed for rhinitis.) 16 g 6   furosemide (LASIX) 40 MG tablet Take 40 mg by mouth. Take 1 Tablet Daily. Can Take Additional Tablet for Weight Gain 3  pound in a Day and 5 pounds in a Week.     glucose blood (ACCU-CHEK GUIDE) test strip USE TO TEST BLOOD SUGARS EVERY MORNING 100 each 3   Lancets (ACCU-CHEK SOFT TOUCH) lancets Check BS QAM 100 each 3   Lancets Misc. (ACCU-CHEK SOFTCLIX LANCET DEV) KIT Check BS QAM 1 kit 0   levothyroxine (SYNTHROID) 50 MCG tablet TAKE 1 TABLET BY MOUTH ONCE A DAY 90 tablet 3   metoprolol tartrate (LOPRESSOR) 25 MG tablet Take 1 tablet (25 mg total) by mouth 2 (two) times daily. 60 tablet 3   Multiple Vitamin (MULTIVITAMIN WITH MINERALS) TABS tablet Take 1 tablet by mouth daily with lunch. Centrum Silver.     potassium chloride (KLOR-CON) 20 MEQ packet Take 20 mEq by mouth once for 1 dose. 1 packet 3   potassium chloride SA (KLOR-CON M) 20 MEQ tablet TAKE 1 TABLET BY MOUTH ONCE A DAY (Patient taking differently: Take 20 mEq by mouth daily. Take additional 20 mg tablet with take an additional fursomide) 90 tablet 1   traMADol (ULTRAM) 50 MG tablet Take 1 tablet (50 mg total) by mouth every 6 (six) hours as needed for moderate pain. (Patient not taking: Reported on 05/04/2022) 28 tablet 0   warfarin (COUMADIN) 5 MG tablet Take 1 tablet (5 mg total) by mouth daily. (Patient taking  differently: Take 5 mg by mouth daily. Pt takes 1/2 tablet on Mondays, Wednesdays and Fridays. 1 tablet on Tuesdays, Thursdays, Saturdays, and Sundays.) 30 tablet 3  Vital Signs:   Physical Exam: CV Pulmonary Abdomen Extremities Wounds   Diagnostic Tests: ***  Impression and Plan: Patient was instructed to continue with sternal precautions (I.e. no lifting more than 10 pounds) for the next 3 weeks.  As long as the patient is NOT taking any narcotic for pain, she may begin driving short distances (I.e. 30 minutes  or less during the day) and may gradually increase the frequency and duration as tolerates. We encourage the  patient to participate in cardiac rehab;she does/does not wish to do so.      Nani Skillern,  PA-C [facility] [phone]

## 2022-05-12 ENCOUNTER — Ambulatory Visit: Payer: Medicare PPO

## 2022-05-12 ENCOUNTER — Encounter: Payer: Medicare PPO | Attending: Cardiovascular Disease | Admitting: *Deleted

## 2022-05-12 ENCOUNTER — Other Ambulatory Visit: Payer: Self-pay | Admitting: Surgery

## 2022-05-12 ENCOUNTER — Ambulatory Visit (INDEPENDENT_AMBULATORY_CARE_PROVIDER_SITE_OTHER): Payer: Self-pay | Admitting: Physician Assistant

## 2022-05-12 ENCOUNTER — Ambulatory Visit
Admission: RE | Admit: 2022-05-12 | Discharge: 2022-05-12 | Disposition: A | Discharge status: Home / Self Care | Payer: Medicare PPO | Source: Ambulatory Visit | Attending: Surgery | Admitting: Surgery

## 2022-05-12 VITALS — BP 131/68 | HR 96 | Resp 20 | Ht 65.1 in | Wt 114.0 lb

## 2022-05-12 VITALS — Ht 65.1 in | Wt 118.2 lb

## 2022-05-12 DIAGNOSIS — J9811 Atelectasis: Secondary | ICD-10-CM | POA: Diagnosis not present

## 2022-05-12 DIAGNOSIS — Z953 Presence of xenogenic heart valve: Secondary | ICD-10-CM

## 2022-05-12 DIAGNOSIS — I700 Atherosclerosis of aorta: Secondary | ICD-10-CM | POA: Diagnosis not present

## 2022-05-12 DIAGNOSIS — Z952 Presence of prosthetic heart valve: Secondary | ICD-10-CM

## 2022-05-12 DIAGNOSIS — Z950 Presence of cardiac pacemaker: Secondary | ICD-10-CM | POA: Diagnosis not present

## 2022-05-12 NOTE — Patient Instructions (Addendum)
Patient Instructions  Patient Details  Name: Patricia [name] MRN: [id number] Date of Birth: 09-Oct-1951 Referring Provider:  Susy Frizzle, MD  Below are your personal goals for exercise, nutrition, and risk factors. Our goal is to help you stay on track towards obtaining and maintaining these goals. We will be discussing your progress on these goals with you throughout the program.  Initial Exercise Prescription:  Initial Exercise Prescription - 05/12/22 1100       Date of Initial Exercise RX and Referring Provider   Date 05/12/22    Referring Provider Eleonore Chiquito MD      Oxygen   Maintain Oxygen Saturation 88% or higher      Treadmill   MPH 1.5    Grade 0.5    Minutes 15    METs 2.26      Recumbant Bike   Level 1    RPM 50    Watts 10    Minutes 15    METs 2      NuStep   Level 1    SPM 70    Minutes 15    METs 1.5      Biostep-RELP   Level 1    SPM 50    Minutes 15    METs 1.8      Track   Laps 15    Minutes 15    METs 1.82      Prescription Details   Frequency (times per week) 3    Duration Progress to 30 minutes of continuous aerobic without signs/symptoms of physical distress      Intensity   THRR 40-80% of Max Heartrate 114-138    Ratings of Perceived Exertion 11-13    Perceived Dyspnea 0-4      Progression   Progression Continue to progress workloads to maintain intensity without signs/symptoms of physical distress.      Resistance Training   Training Prescription Yes    Weight 3 lb    Reps 10-15             Exercise Goals: Frequency: Be able to perform aerobic exercise two to three times per week in program working toward 2-5 days per week of home exercise.  Intensity: Work with a perceived exertion of 11 (fairly light) - 15 (hard) while following your exercise prescription.  We will make changes to your prescription with you as you progress through the program.   Duration: Be able to do 30 to 45 minutes of continuous  aerobic exercise in addition to a 5 minute warm-up and a 5 minute cool-down routine.   Nutrition Goals: Your personal nutrition goals will be established when you do your nutrition analysis with the dietician.  The following are general nutrition guidelines to follow: Cholesterol < '200mg'$ /day Sodium < '1500mg'$ /day Fiber: Women over 50 yrs - 21 grams per day  Personal Goals:  Personal Goals and Risk Factors at Admission - 05/12/22 1131       Core Components/Risk Factors/Patient Goals on Admission    Weight Management Yes;Weight Gain;Weight Maintenance    Intervention Weight Management: Develop a combined nutrition and exercise program designed to reach desired caloric intake, while maintaining appropriate intake of nutrient and fiber, sodium and fats, and appropriate energy expenditure required for the weight goal.;Weight Management: Provide education and appropriate resources to help participant work on and attain dietary goals.    Admit Weight 118 lb 3.2 oz (53.6 kg)    Goal Weight: Short Term 120 lb (54.4 kg)  Goal Weight: Long Term 120 lb (54.4 kg)    Expected Outcomes Short Term: Continue to assess and modify interventions until short term weight is achieved;Long Term: Adherence to nutrition and physical activity/exercise program aimed toward attainment of established weight goal;Weight Gain: Understanding of general recommendations for a high calorie, high protein meal plan that promotes weight gain by distributing calorie intake throughout the day with the consumption for 4-5 meals, snacks, and/or supplements;Weight Maintenance: Understanding of the daily nutrition guidelines, which includes 25-35% calories from fat, 7% or less cal from saturated fats, less than '200mg'$  cholesterol, less than 1.5gm of sodium, & 5 or more servings of fruits and vegetables daily    Diabetes Yes    Intervention Provide education about signs/symptoms and action to take for hypo/hyperglycemia.;Provide education  about proper nutrition, including hydration, and aerobic/resistive exercise prescription along with prescribed medications to achieve blood glucose in normal ranges: Fasting glucose 65-99 mg/dL    Expected Outcomes Short Term: Participant verbalizes understanding of the signs/symptoms and immediate care of hyper/hypoglycemia, proper foot care and importance of medication, aerobic/resistive exercise and nutrition plan for blood glucose control.;Long Term: Attainment of HbA1C < 7%.    Lipids Yes    Intervention Provide education and support for participant on nutrition & aerobic/resistive exercise along with prescribed medications to achieve LDL '70mg'$ , HDL >'40mg'$ .    Expected Outcomes Short Term: Participant states understanding of desired cholesterol values and is compliant with medications prescribed. Participant is following exercise prescription and nutrition guidelines.;Long Term: Cholesterol controlled with medications as prescribed, with individualized exercise RX and with personalized nutrition plan. Value goals: LDL < '70mg'$ , HDL > 40 mg.             Tobacco Use Initial Evaluation: Social History   Tobacco Use  Smoking Status Never  Smokeless Tobacco Never    Exercise Goals and Review:  Exercise Goals     Row Name 05/12/22 1126             Exercise Goals   Increase Physical Activity Yes       Intervention Provide advice, education, support and counseling about physical activity/exercise needs.;Develop an individualized exercise prescription for aerobic and resistive training based on initial evaluation findings, risk stratification, comorbidities and participant's personal goals.       Expected Outcomes Short Term: Attend rehab on a regular basis to increase amount of physical activity.;Long Term: Add in home exercise to make exercise part of routine and to increase amount of physical activity.;Long Term: Exercising regularly at least 3-5 days a week.       Increase Strength and  Stamina Yes       Intervention Provide advice, education, support and counseling about physical activity/exercise needs.;Develop an individualized exercise prescription for aerobic and resistive training based on initial evaluation findings, risk stratification, comorbidities and participant's personal goals.       Expected Outcomes Short Term: Increase workloads from initial exercise prescription for resistance, speed, and METs.;Short Term: Perform resistance training exercises routinely during rehab and add in resistance training at home;Long Term: Improve cardiorespiratory fitness, muscular endurance and strength as measured by increased METs and functional capacity (6MWT)       Able to understand and use rate of perceived exertion (RPE) scale Yes       Intervention Provide education and explanation on how to use RPE scale       Expected Outcomes Short Term: Able to use RPE daily in rehab to express subjective intensity level;Long Term:  Able  to use RPE to guide intensity level when exercising independently       Able to understand and use Dyspnea scale Yes       Intervention Provide education and explanation on how to use Dyspnea scale       Expected Outcomes Short Term: Able to use Dyspnea scale daily in rehab to express subjective sense of shortness of breath during exertion;Long Term: Able to use Dyspnea scale to guide intensity level when exercising independently       Knowledge and understanding of Target Heart Rate Range (THRR) Yes       Intervention Provide education and explanation of THRR including how the numbers were predicted and where they are located for reference       Expected Outcomes Short Term: Able to state/look up THRR;Long Term: Able to use THRR to govern intensity when exercising independently;Short Term: Able to use daily as guideline for intensity in rehab       Able to check pulse independently Yes       Intervention Provide education and demonstration on how to check pulse  in carotid and radial arteries.;Review the importance of being able to check your own pulse for safety during independent exercise       Expected Outcomes Short Term: Able to explain why pulse checking is important during independent exercise;Long Term: Able to check pulse independently and accurately       Understanding of Exercise Prescription Yes       Intervention Provide education, explanation, and written materials on patient's individual exercise prescription       Expected Outcomes Short Term: Able to explain program exercise prescription;Long Term: Able to explain home exercise prescription to exercise independently                Copy of goals given to participant.

## 2022-05-12 NOTE — Progress Notes (Signed)
Cardiac Individual Treatment Plan  Patient Details  Name: Patricia [name] MRN: [id number] Date of Birth: 09/27/1951 Referring Provider:   Flowsheet Row Cardiac Rehab from 05/12/2022 in [facility] Cardiac and Pulmonary Rehab  Referring Provider Eleonore Chiquito MD       Initial Encounter Date:  Flowsheet Row Cardiac Rehab from 05/12/2022 in [facility] Cardiac and Pulmonary Rehab  Date 05/12/22       Visit Diagnosis: S/P aortic valve replacement  Patient's Home Medications on Admission:  Current Outpatient Medications:    acetaminophen (TYLENOL) 325 MG tablet, Take 2 tablets (650 mg total) by mouth 2 (two) times daily as needed for moderate pain or headache., Disp: , Rfl:    alendronate (FOSAMAX) 70 MG tablet, Take 1 tablet (70 mg total) by mouth once a week. (Patient taking differently: Take 70 mg by mouth once a week. Sunday), Disp: 12 tablet, Rfl: 3   amiodarone (PACERONE) 200 MG tablet, Take 0.5 tablets (100 mg total) by mouth daily., Disp: 45 tablet, Rfl: 3   atorvastatin (LIPITOR) 20 MG tablet, Take 1 tablet (20 mg total) by mouth daily., Disp: 30 tablet, Rfl: 3   Blood Glucose Monitoring Suppl (ACCU-CHEK GUIDE) w/Device KIT, 1 Units by Does not apply route daily., Disp: 1 kit, Rfl: 0   Calcium Carbonate (CALCIUM 600 PO), Take 600 mg by mouth 2 (two) times daily. Lunch and Dinner, Disp: , Rfl:    CREON 36000-114000 units CPEP capsule, Take 36,000-72,000 Units by mouth See admin instructions. Take 72,000 in the morning and 36,000 for lunch and dinner, Disp: , Rfl:    esomeprazole (NEXIUM) 40 MG capsule, Take 40 mg by mouth daily before breakfast. , Disp: , Rfl: 12   fluticasone (FLONASE) 50 MCG/ACT nasal spray, Place 2 sprays into both nostrils daily. (Patient taking differently: Place 2 sprays into both nostrils daily as needed for rhinitis.), Disp: 16 g, Rfl: 6   furosemide (LASIX) 40 MG tablet, Take 40 mg by mouth. Take 1 Tablet Daily. Can Take Additional Tablet for Weight Gain 3 pound in a Day  and 5 pounds in a Week., Disp: , Rfl:    glucose blood (ACCU-CHEK GUIDE) test strip, USE TO TEST BLOOD SUGARS EVERY MORNING, Disp: 100 each, Rfl: 3   Lancets (ACCU-CHEK SOFT TOUCH) lancets, Check BS QAM, Disp: 100 each, Rfl: 3   Lancets Misc. (ACCU-CHEK SOFTCLIX LANCET DEV) KIT, Check BS QAM, Disp: 1 kit, Rfl: 0   levothyroxine (SYNTHROID) 50 MCG tablet, TAKE 1 TABLET BY MOUTH ONCE A DAY, Disp: 90 tablet, Rfl: 3   metoprolol tartrate (LOPRESSOR) 25 MG tablet, Take 1 tablet (25 mg total) by mouth 2 (two) times daily., Disp: 60 tablet, Rfl: 3   Multiple Vitamin (MULTIVITAMIN WITH MINERALS) TABS tablet, Take 1 tablet by mouth daily with lunch. Centrum Silver., Disp: , Rfl:    potassium chloride (KLOR-CON) 20 MEQ packet, Take 20 mEq by mouth once for 1 dose., Disp: 1 packet, Rfl: 3   potassium chloride SA (KLOR-CON M) 20 MEQ tablet, TAKE 1 TABLET BY MOUTH ONCE A DAY (Patient taking differently: Take 20 mEq by mouth daily. Take additional 20 mg tablet with take an additional fursomide), Disp: 90 tablet, Rfl: 1   traMADol (ULTRAM) 50 MG tablet, Take 1 tablet (50 mg total) by mouth every 6 (six) hours as needed for moderate pain. (Patient not taking: Reported on 05/04/2022), Disp: 28 tablet, Rfl: 0   warfarin (COUMADIN) 5 MG tablet, Take 1 tablet (5 mg total) by mouth daily. (Patient  taking differently: Take 5 mg by mouth daily. Pt takes 1/2 tablet on Mondays, Wednesdays and Fridays. 1 tablet on Tuesdays, Thursdays, Saturdays, and $RemoveBefore"Sundays.), Disp: 30 tablet, Rfl: 3  Past Medical History: Past Medical History:  Diagnosis Date   Atrial flutter (HCC)    Ablated 1998 Dr. Klein   Basal cell carcinoma 06/05/2019   R low back, EDC 07/16/19   Basal cell carcinoma 04/15/2010   Right post shoulder   Basal cell carcinoma 04/15/2010   Mid back   Basal cell carcinoma (BCC) 09/29/2016   Left post shoulder. Superficial and nodular.   BCC (basal cell carcinoma) 07/21/2021   right mid back, EDC 09/15/2021   BCC  (basal cell carcinoma) 01/27/2022   left lower abdomen   Bleeding gastric ulcer    back in 2000   Breast cancer (HCC)    COPD (chronic obstructive pulmonary disease) (HCC)    from radiation   GERD (gastroesophageal reflux disease)    History of basal cell carcinoma (BCC) 03/22/2021   right neck and right medial ear, Moh's   Hodgkin's lymphoma (HCC)    Treated with radiation and chemo   Mitral regurgitation    pvc   Osteoporosis    lumbar verterbral fracture, left ankle fracture, dexa 2015   Pancreatitis    Pneumonia    Prediabetes    Presence of permanent cardiac pacemaker    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 09/13/2017   25 mm Medtronic Mosaic porcine stented bioprosthetic tissue valve   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   Left medial lower leg   Ulcer     Tobacco Use: Social History   Tobacco Use  Smoking Status Never  Smokeless Tobacco Never    Labs: Review Flowsheet        Latest Ref Rng & Units 12/19/2019 03/25/2021 04/05/2022 04/07/2022  Labs for ITP Cardiac and Pulmonary Rehab  Cholestrol <200 mg/dL  136      LDL (calc) mg/dL (calc)  57      HDL-C > OR = 50 mg/dL  62      Trlycerides <150 mg/dL  88      Hemoglobin A1c 4.8 - 5.6 % 6.1   6.3   6.8     PH, Arterial 7.35 - 7.45   7.45   7.482     7.457     7.396     7.416     7.401     7.472     7.540     7.406     7.446    PCO2 arterial 32 - 48 mmHg   44   34.6     36.5     44.5     46.1     51.1     43.7     37.8     35.8     45.5    Bicarbonate 20.0 - 28.0 mmol/L   30.6   25.8     25.9     27.3     29.6     31.7     32.0     32.4     22.5     31.3    TCO2 22 - 32 mmol/L    27     27     29     30     31     33     33     31"[id number]$   33     32     34     24     28     33     32    Acid-base deficit 0.0 - 2.0 mmol/L    2.0    O2 Saturation %   98.5   100     100     100     100     100     100     100     81     100        04/08/2022  Labs for ITP Cardiac and Pulmonary Rehab  Cholestrol   LDL (calc)   HDL-C   Trlycerides   Hemoglobin A1c   PH, Arterial 7.368     7.520    PCO2 arterial 43.6     29.2    Bicarbonate 24.8     23.7    TCO2 26     25    Acid-base deficit   O2 Saturation 100     99        Multiple values from one day are sorted in reverse-chronological order         Exercise Target Goals: Exercise Program Goal: Individual exercise prescription set using results from initial 6 min walk test and THRR while considering  patient's activity barriers and safety.   Exercise Prescription Goal: Initial exercise prescription builds to 30-45 minutes a day of aerobic activity, 2-3 days per week.  Home exercise guidelines will be given to patient during program as part of exercise prescription that the participant will acknowledge.   Education: Aerobic Exercise: - Group verbal and visual presentation on the components of exercise prescription. Introduces F.I.T.T principle from ACSM for exercise prescriptions.  Reviews F.I.T.T. principles of aerobic exercise including progression. Written material given at graduation.   Education: Resistance Exercise: - Group verbal and visual presentation on the components of exercise prescription. Introduces F.I.T.T principle from ACSM for exercise prescriptions  Reviews F.I.T.T. principles of resistance exercise including progression. Written material given at graduation.    Education: Exercise & Equipment Safety: - Individual verbal instruction and demonstration of equipment use and safety with use of the equipment. Flowsheet Row Cardiac Rehab from 05/12/2022 in [facility] Cardiac and Pulmonary Rehab  Education need identified 05/12/22       Education: Exercise Physiology & General Exercise Guidelines: - Group verbal and written instruction with models to review the exercise physiology of the cardiovascular system and associated critical values. Provides  general exercise guidelines with specific guidelines to those with heart or lung disease.    Education: Flexibility, Balance, Mind/Body Relaxation: - Group verbal and visual presentation with interactive activity on the components of exercise prescription. Introduces F.I.T.T principle from ACSM for exercise prescriptions. Reviews F.I.T.T. principles of flexibility and balance exercise training including progression. Also discusses the mind body connection.  Reviews various relaxation techniques to help reduce and manage stress (i.e. Deep breathing, progressive muscle relaxation, and visualization). Balance handout provided to take home. Written material given at graduation.   Activity Barriers & Risk Stratification:  Activity Barriers & Cardiac Risk Stratification - 05/12/22 1108       Activity Barriers & Cardiac Risk Stratification   Activity Barriers Arthritis;Joint Problems;Shortness of Breath;Deconditioning;Muscular Weakness   broke left ankle (plate/screws), bilateral knee arthritis   Cardiac Risk Stratification Moderate             6 Minute Walk:  6 Minute Walk  [place] Name 05/12/22 1106         6 Minute Walk   Phase Initial     Distance 878 feet     Walk Time 6 minutes     # of Rest Breaks 0     MPH 1.66     METS 2.63     RPE 15     Perceived Dyspnea  3     VO2 Peak 9.21     Symptoms Yes (comment)     Comments fatigue, SOB     Resting HR 92 bpm     Resting BP 134/62     Resting Oxygen Saturation  98 %     Exercise Oxygen Saturation  during 6 min walk 94 %     Max Ex. HR 110 bpm     Max Ex. BP 154/74     2 Minute Post BP 124/70              Oxygen Initial Assessment:   Oxygen Re-Evaluation:   Oxygen Discharge (Final Oxygen Re-Evaluation):   Initial Exercise Prescription:  Initial Exercise Prescription - 05/12/22 1100       Date of Initial Exercise RX and Referring Provider   Date 05/12/22    Referring Provider Eleonore Chiquito MD      Oxygen    Maintain Oxygen Saturation 88% or higher      Treadmill   MPH 1.5    Grade 0.5    Minutes 15    METs 2.26      Recumbant Bike   Level 1    RPM 50    Watts 10    Minutes 15    METs 2      NuStep   Level 1    SPM 70    Minutes 15    METs 1.5      Biostep-RELP   Level 1    SPM 50    Minutes 15    METs 1.8      Track   Laps 15    Minutes 15    METs 1.82      Prescription Details   Frequency (times per week) 3    Duration Progress to 30 minutes of continuous aerobic without signs/symptoms of physical distress      Intensity   THRR 40-80% of Max Heartrate 114-138    Ratings of Perceived Exertion 11-13    Perceived Dyspnea 0-4      Progression   Progression Continue to progress workloads to maintain intensity without signs/symptoms of physical distress.      Resistance Training   Training Prescription Yes    Weight 3 lb    Reps 10-15             Perform Capillary Blood Glucose checks as needed.  Exercise Prescription Changes:   Exercise Prescription Changes     Row Name 05/12/22 1100             Response to Exercise   Blood Pressure (Admit) 134/62       Blood Pressure (Exercise) 154/74       Blood Pressure (Exit) 124/70       Heart Rate (Admit) 92 bpm       Heart Rate (Exercise) 110 bpm       Heart Rate (Exit) 103 bpm       Oxygen Saturation (Admit) 98 %       Oxygen Saturation (Exercise) 94 %  Oxygen Saturation (Exit) 98 %       Rating of Perceived Exertion (Exercise) 15       Perceived Dyspnea (Exercise) 3       Symptoms fatigue, SOB       Comments 6MWT       Duration Progress to 30 minutes of  aerobic without signs/symptoms of physical distress       Intensity THRR unchanged         Progression   Progression Continue to progress workloads to maintain intensity without signs/symptoms of physical distress.       Average METs 2.63                Exercise Comments:   Exercise Goals and Review:   Exercise Goals      Row Name 05/12/22 1126             Exercise Goals   Increase Physical Activity Yes       Intervention Provide advice, education, support and counseling about physical activity/exercise needs.;Develop an individualized exercise prescription for aerobic and resistive training based on initial evaluation findings, risk stratification, comorbidities and participant's personal goals.       Expected Outcomes Short Term: Attend rehab on a regular basis to increase amount of physical activity.;Long Term: Add in home exercise to make exercise part of routine and to increase amount of physical activity.;Long Term: Exercising regularly at least 3-5 days a week.       Increase Strength and Stamina Yes       Intervention Provide advice, education, support and counseling about physical activity/exercise needs.;Develop an individualized exercise prescription for aerobic and resistive training based on initial evaluation findings, risk stratification, comorbidities and participant's personal goals.       Expected Outcomes Short Term: Increase workloads from initial exercise prescription for resistance, speed, and METs.;Short Term: Perform resistance training exercises routinely during rehab and add in resistance training at home;Long Term: Improve cardiorespiratory fitness, muscular endurance and strength as measured by increased METs and functional capacity (6MWT)       Able to understand and use rate of perceived exertion (RPE) scale Yes       Intervention Provide education and explanation on how to use RPE scale       Expected Outcomes Short Term: Able to use RPE daily in rehab to express subjective intensity level;Long Term:  Able to use RPE to guide intensity level when exercising independently       Able to understand and use Dyspnea scale Yes       Intervention Provide education and explanation on how to use Dyspnea scale       Expected Outcomes Short Term: Able to use Dyspnea scale daily in rehab to  express subjective sense of shortness of breath during exertion;Long Term: Able to use Dyspnea scale to guide intensity level when exercising independently       Knowledge and understanding of Target Heart Rate Range (THRR) Yes       Intervention Provide education and explanation of THRR including how the numbers were predicted and where they are located for reference       Expected Outcomes Short Term: Able to state/look up THRR;Long Term: Able to use THRR to govern intensity when exercising independently;Short Term: Able to use daily as guideline for intensity in rehab       Able to check pulse independently Yes       Intervention Provide education and demonstration on how to check pulse  in carotid and radial arteries.;Review the importance of being able to check your own pulse for safety during independent exercise       Expected Outcomes Short Term: Able to explain why pulse checking is important during independent exercise;Long Term: Able to check pulse independently and accurately       Understanding of Exercise Prescription Yes       Intervention Provide education, explanation, and written materials on patient's individual exercise prescription       Expected Outcomes Short Term: Able to explain program exercise prescription;Long Term: Able to explain home exercise prescription to exercise independently                Exercise Goals Re-Evaluation :   Discharge Exercise Prescription (Final Exercise Prescription Changes):  Exercise Prescription Changes - 05/12/22 1100       Response to Exercise   Blood Pressure (Admit) 134/62    Blood Pressure (Exercise) 154/74    Blood Pressure (Exit) 124/70    Heart Rate (Admit) 92 bpm    Heart Rate (Exercise) 110 bpm    Heart Rate (Exit) 103 bpm    Oxygen Saturation (Admit) 98 %    Oxygen Saturation (Exercise) 94 %    Oxygen Saturation (Exit) 98 %    Rating of Perceived Exertion (Exercise) 15    Perceived Dyspnea (Exercise) 3    Symptoms  fatigue, SOB    Comments 6MWT    Duration Progress to 30 minutes of  aerobic without signs/symptoms of physical distress    Intensity THRR unchanged      Progression   Progression Continue to progress workloads to maintain intensity without signs/symptoms of physical distress.    Average METs 2.63             Nutrition:  Target Goals: Understanding of nutrition guidelines, daily intake of sodium '1500mg'$ , cholesterol '200mg'$ , calories 30% from fat and 7% or less from saturated fats, daily to have 5 or more servings of fruits and vegetables.  Education: All About Nutrition: -Group instruction provided by verbal, written material, interactive activities, discussions, models, and posters to present general guidelines for heart healthy nutrition including fat, fiber, MyPlate, the role of sodium in heart healthy nutrition, utilization of the nutrition label, and utilization of this knowledge for meal planning. Follow up email sent as well. Written material given at graduation. Flowsheet Row Cardiac Rehab from 05/12/2022 in [facility] Cardiac and Pulmonary Rehab  Education need identified 05/12/22       Biometrics:  Pre Biometrics - 05/12/22 1127       Pre Biometrics   Height 5' 5.1" (1.654 m)    Weight 118 lb 3.2 oz (53.6 kg)    BMI (Calculated) 19.6    Single Leg Stand 3.3 seconds              Nutrition Therapy Plan and Nutrition Goals:  Nutrition Therapy & Goals - 05/12/22 1128       Intervention Plan   Intervention Prescribe, educate and counsel regarding individualized specific dietary modifications aiming towards targeted core components such as weight, hypertension, lipid management, diabetes, heart failure and other comorbidities.    Expected Outcomes Short Term Goal: Understand basic principles of dietary content, such as calories, fat, sodium, cholesterol and nutrients.;Long Term Goal: Adherence to prescribed nutrition plan.;Short Term Goal: A plan has been developed with  personal nutrition goals set during dietitian appointment.             Nutrition Assessments:  MEDIFICTS Score Key: ?  70 Need to make dietary changes  40-70 Heart Healthy Diet ? 40 Therapeutic Level Cholesterol Diet  Flowsheet Row Cardiac Rehab from 05/12/2022 in [facility] Cardiac and Pulmonary Rehab  Picture Your Plate Total Score on Admission 76      Picture Your Plate Scores: <09 Unhealthy dietary pattern with much room for improvement. 41-50 Dietary pattern unlikely to meet recommendations for good health and room for improvement. 51-60 More healthful dietary pattern, with some room for improvement.  >60 Healthy dietary pattern, although there may be some specific behaviors that could be improved.    Nutrition Goals Re-Evaluation:   Nutrition Goals Discharge (Final Nutrition Goals Re-Evaluation):   Psychosocial: Target Goals: Acknowledge presence or absence of significant depression and/or stress, maximize coping skills, provide positive support system. Participant is able to verbalize types and ability to use techniques and skills needed for reducing stress and depression.   Education: Stress, Anxiety, and Depression - Group verbal and visual presentation to define topics covered.  Reviews how body is impacted by stress, anxiety, and depression.  Also discusses healthy ways to reduce stress and to treat/manage anxiety and depression.  Written material given at graduation.   Education: Sleep Hygiene -Provides group verbal and written instruction about how sleep can affect your health.  Define sleep hygiene, discuss sleep cycles and impact of sleep habits. Review good sleep hygiene tips.    Initial Review & Psychosocial Screening:  Initial Psych Review & Screening - 05/04/22 0943       Initial Review   Current issues with None Identified      Family Dynamics   Good Support System? Yes   friends and neighbors   Comments lost husband 08/20/2017 from pancreatic cancer        Barriers   Psychosocial barriers to participate in program There are no identifiable barriers or psychosocial needs.      Screening Interventions   Interventions Encouraged to exercise;To provide support and resources with identified psychosocial needs;Provide feedback about the scores to participant    Expected Outcomes Short Term goal: Utilizing psychosocial counselor, staff and physician to assist with identification of specific Stressors or current issues interfering with healing process. Setting desired goal for each stressor or current issue identified.;Long Term Goal: Stressors or current issues are controlled or eliminated.;Short Term goal: Identification and review with participant of any Quality of Life or Depression concerns found by scoring the questionnaire.;Long Term goal: The participant improves quality of Life and PHQ9 Scores as seen by post scores and/or verbalization of changes             Quality of Life Scores:   Scores of 19 and below usually indicate a poorer quality of life in these areas.  A difference of  2-3 points is a clinically meaningful difference.  A difference of 2-3 points in the total score of the Quality of Life Index has been associated with significant improvement in overall quality of life, self-image, physical symptoms, and general health in studies assessing change in quality of life.  PHQ-9: Review Flowsheet        05/12/2022 03/22/2021 12/19/2019 02/09/2018 01/23/2018  Depression screen PHQ 2/9  Decreased Interest 0 0 0 0 1  Down, Depressed, Hopeless 0 0 0 0 1  PHQ - 2 Score 0 0 0 0 2  Altered sleeping 1    2  Tired, decreased energy 3    1  Change in appetite 3    0  Feeling bad or failure about yourself  0    0  Trouble concentrating 0    0  Moving slowly or fidgety/restless 1    0  Suicidal thoughts 0    0  PHQ-9 Score 8    5  Difficult doing work/chores Somewhat difficult    Not difficult at all         Interpretation of Total Score   Total Score Depression Severity:  1-4 = Minimal depression, 5-9 = Mild depression, 10-14 = Moderate depression, 15-19 = Moderately severe depression, 20-27 = Severe depression   Psychosocial Evaluation and Intervention:  Psychosocial Evaluation - 05/04/22 0954       Psychosocial Evaluation & Interventions   Interventions Encouraged to exercise with the program and follow exercise prescription    Comments [place] has no barriers to attending the program. [facility] is ready to start the program and work on her strength and stamina and learn about caring for herself. She has completed CR in the past. She is a widow and has friends and neighbors that check on her daily. 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    Expected Outcomes STG Sherrie will attend all scheduled sessions, she will gain back her strength and stamina to get back to her live and hobby of traveling. LTG Levana will continue to progress after discharge    Continue Psychosocial Services  Follow up required by staff             Psychosocial Re-Evaluation:   Psychosocial Discharge (Final Psychosocial Re-Evaluation):   Vocational Rehabilitation: Provide vocational rehab assistance to qualifying candidates.   Vocational Rehab Evaluation & Intervention:   Education: Education Goals: Education classes will be provided on a variety of topics geared toward better understanding of heart health and risk factor modification. Participant will state understanding/return demonstration of topics presented as noted by education test scores.  Learning Barriers/Preferences:   General Cardiac Education Topics:  AED/CPR: - Group verbal and written instruction with the use of models to demonstrate the basic use of the AED with the basic ABC's of resuscitation.   Anatomy and Cardiac Procedures: - Group verbal and visual presentation and models provide information about basic cardiac anatomy and  function. Reviews the testing methods done to diagnose heart disease and the outcomes of the test results. Describes the treatment choices: Medical Management, Angioplasty, or Coronary Bypass Surgery for treating various heart conditions including Myocardial Infarction, Angina, Valve Disease, and Cardiac Arrhythmias.  Written material given at graduation. Flowsheet Row Cardiac Rehab from 05/12/2022 in [facility] Cardiac and Pulmonary Rehab  Education need identified 05/12/22       Medication Safety: - Group verbal and visual instruction to review commonly prescribed medications for heart and lung disease. Reviews the medication, class of the drug, and side effects. Includes the steps to properly store meds and maintain the prescription regimen.  Written material given at graduation.   Intimacy: - Group verbal instruction through game format to discuss how heart and lung disease can affect sexual intimacy. Written material given at graduation..   Know Your Numbers and Heart Failure: - Group verbal and visual instruction to discuss disease risk factors for cardiac and pulmonary disease and treatment options.  Reviews associated critical values for Overweight/Obesity, Hypertension, Cholesterol, and Diabetes.  Discusses basics of heart failure: signs/symptoms and treatments.  Introduces Heart Failure Zone chart for action plan for heart failure.  Written material given at graduation.   Infection Prevention: - Provides verbal and written material  to individual with discussion of infection control including proper hand washing and proper equipment cleaning during exercise session. Flowsheet Row Cardiac Rehab from 05/12/2022 in [facility] Cardiac and Pulmonary Rehab  Education need identified 05/12/22       Falls Prevention: - Provides verbal and written material to individual with discussion of falls prevention and safety. Flowsheet Row Cardiac Rehab from 05/12/2022 in [facility] Cardiac and Pulmonary Rehab  Date  05/04/22  Educator SB  Instruction Review Code 1- Verbalizes Understanding       Other: -Provides group and verbal instruction on various topics (see comments)   Knowledge Questionnaire Score:   Core Components/Risk Factors/Patient Goals at Admission:  Personal Goals and Risk Factors at Admission - 05/12/22 1131       Core Components/Risk Factors/Patient Goals on Admission    Weight Management Yes;Weight Gain;Weight Maintenance    Intervention Weight Management: Develop a combined nutrition and exercise program designed to reach desired caloric intake, while maintaining appropriate intake of nutrient and fiber, sodium and fats, and appropriate energy expenditure required for the weight goal.;Weight Management: Provide education and appropriate resources to help participant work on and attain dietary goals.    Admit Weight 118 lb 3.2 oz (53.6 kg)    Goal Weight: Short Term 120 lb (54.4 kg)    Goal Weight: Long Term 120 lb (54.4 kg)    Expected Outcomes Short Term: Continue to assess and modify interventions until short term weight is achieved;Long Term: Adherence to nutrition and physical activity/exercise program aimed toward attainment of established weight goal;Weight Gain: Understanding of general recommendations for a high calorie, high protein meal plan that promotes weight gain by distributing calorie intake throughout the day with the consumption for 4-5 meals, snacks, and/or supplements;Weight Maintenance: Understanding of the daily nutrition guidelines, which includes 25-35% calories from fat, 7% or less cal from saturated fats, less than $RemoveB'200mg'frJbTQwL$  cholesterol, less than 1.5gm of sodium, & 5 or more servings of fruits and vegetables daily    Diabetes Yes    Intervention Provide education about signs/symptoms and action to take for hypo/hyperglycemia.;Provide education about proper nutrition, including hydration, and aerobic/resistive exercise prescription along with prescribed  medications to achieve blood glucose in normal ranges: Fasting glucose 65-99 mg/dL    Expected Outcomes Short Term: Participant verbalizes understanding of the signs/symptoms and immediate care of hyper/hypoglycemia, proper foot care and importance of medication, aerobic/resistive exercise and nutrition plan for blood glucose control.;Long Term: Attainment of HbA1C < 7%.    Lipids Yes    Intervention Provide education and support for participant on nutrition & aerobic/resistive exercise along with prescribed medications to achieve LDL '70mg'$ , HDL >$Remo'40mg'pmNju$ .    Expected Outcomes Short Term: Participant states understanding of desired cholesterol values and is compliant with medications prescribed. Participant is following exercise prescription and nutrition guidelines.;Long Term: Cholesterol controlled with medications as prescribed, with individualized exercise RX and with personalized nutrition plan. Value goals: LDL < $Rem'70mg'QHvE$ , HDL > 40 mg.             Education:Diabetes - Individual verbal and written instruction to review signs/symptoms of diabetes, desired ranges of glucose level fasting, after meals and with exercise. Acknowledge that pre and post exercise glucose checks will be done for 3 sessions at entry of program. Flowsheet Row Cardiac Rehab from 05/12/2022 in [facility] Cardiac and Pulmonary Rehab  Date 05/12/22       Core Components/Risk Factors/Patient Goals Review:    Core Components/Risk Factors/Patient Goals at Discharge (Final Review):    ITP  Comments:  ITP Comments     Row Name 05/04/22 0958 05/12/22 1104         ITP Comments Virtual orientation call completed today. shehas an appointment on Date: 05/12/2022  for EP eval and gym Orientation.  Documentation of diagnosis can be found in [facility] Date: 04/07/2022 . Completed 6MWT and gym orientation. Initial ITP created and sent for review to Dr. Ramonita Lab, covering for Dr. Sabra Heck Medical Director.               Comments: Initial  ITP

## 2022-05-12 NOTE — Patient Instructions (Signed)
Patient instructed to take another dose of Lasix 40 mg daily and potassium supplement 20 meq tonight. Starting tomorrow 06/02, she will take Lasix and potassium two times daily for 3 more days. On Monday, she will resume daily Lasix and potassium supplement.  Endocarditis is a potentially serious infection of heart valves or inside lining of the heart.  It occurs more commonly in patients with diseased heart valves (such as patient's with aortic or mitral valve disease) and in patients who have undergone heart valve repair or replacement.  Certain surgical and dental procedures may put you at risk, such as dental cleaning, other dental procedures, or any surgery involving the respiratory, urinary, gastrointestinal tract, gallbladder or prostate gland.   To minimize your chances for develooping endocarditis, maintain good oral health and seek prompt medical attention for any infections involving the mouth, teeth, gums, skin or urinary tract.    Always notify your doctor or dentist about your underlying heart valve condition before having any invasive procedures. You will need to take antibiotics before certain procedures, including all routine dental cleanings or other dental procedures.  Your cardiologist or dentist should prescribe these antibiotics for you to be taken ahead of time.  You may return to driving an automobile as long as you are no longer requiring oral narcotic pain relievers during the daytime.  It would be wise to start driving only short distances during the daylight and gradually increase from there as you feel comfortable.   You may continue to gradually increase your physical activity as tolerated.  Refrain from any heavy lifting or strenuous use of your arms and shoulders until at least 8 weeks from the time of your surgery, and avoid activities that cause increased pain in your chest on the side of your surgical incision.  Otherwise you may continue to increase activities without  any particular limitations.  Increase the intensity and duration of physical activity gradually.

## 2022-05-16 ENCOUNTER — Encounter: Payer: Medicare PPO | Admitting: *Deleted

## 2022-05-16 DIAGNOSIS — Z952 Presence of prosthetic heart valve: Secondary | ICD-10-CM

## 2022-05-16 NOTE — Progress Notes (Signed)
Daily Session Note  Patient Details  Name: Patricia [name] MRN: [id number] Date of Birth: 12/05/1951 Referring Provider:   Flowsheet Row Cardiac Rehab from 05/12/2022 in [facility] Cardiac and Pulmonary Rehab  Referring Provider Eleonore Chiquito MD       Encounter Date: 05/16/2022  Check In:  Session Check In - 05/16/22 0925       Check-In   Supervising physician immediately available to respond to emergencies See telemetry face sheet for immediately available ER MD    Location [facility]-Cardiac & Pulmonary Rehab    Staff Present Heath Lark, RN, BSN, CCRP;Joseph Huetter, RCP,RRT,BSRT;Kelly [place], [place], ACSM CEP, Exercise Physiologist    Virtual Visit No    Medication changes reported     No    Fall or balance concerns reported    No    Warm-up and Cool-down Performed on first and last piece of equipment    Resistance Training Performed Yes    VAD Patient? No    PAD/SET Patient? No      Pain Assessment   Currently in Pain? No/denies                Social History   Tobacco Use  Smoking Status Never  Smokeless Tobacco Never    Goals Met:  Exercise tolerated well Personal goals reviewed No report of concerns or symptoms today  Goals Unmet:  Not Applicable  Comments: First full day of exercise!  Patient was oriented to gym and equipment including functions, settings, policies, and procedures.  Patient's individual exercise prescription and treatment plan were reviewed.  All starting workloads were established based on the results of the 6 minute walk test done at initial orientation visit.  The plan for exercise progression was also introduced and progression will be customized based on patient's performance and goals.    Dr. Emily Filbert is Medical Director for [facility].  Dr. Ottie Glazier is Medical Director for [facility] Pulmonary Rehabilitation.

## 2022-05-17 ENCOUNTER — Other Ambulatory Visit: Payer: Self-pay | Admitting: Surgery

## 2022-05-17 ENCOUNTER — Ambulatory Visit (HOSPITAL_COMMUNITY): Payer: Medicare PPO | Attending: Internal Medicine

## 2022-05-17 DIAGNOSIS — Z952 Presence of prosthetic heart valve: Secondary | ICD-10-CM | POA: Diagnosis not present

## 2022-05-17 LAB — ECHOCARDIOGRAM COMPLETE
AR max vel: 1.25 cm2
AV Area VTI: 1.27 cm2
AV Area mean vel: 1.33 cm2
AV Mean grad: 3 mmHg
AV Peak grad: 4.9 mmHg
Ao pk vel: 1.1 m/s
Area-P 1/2: 8.62 cm2
S' Lateral: 2.3 cm
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
## 2022-05-18 ENCOUNTER — Ambulatory Visit
Admission: RE | Admit: 2022-05-18 | Discharge: 2022-05-18 | Disposition: A | Discharge status: Home / Self Care | Payer: Medicare PPO | Source: Ambulatory Visit | Attending: Surgery | Admitting: Surgery

## 2022-05-18 ENCOUNTER — Other Ambulatory Visit: Payer: Self-pay | Admitting: Surgery

## 2022-05-18 ENCOUNTER — Ambulatory Visit (INDEPENDENT_AMBULATORY_CARE_PROVIDER_SITE_OTHER): Payer: Self-pay | Admitting: Physician Assistant

## 2022-05-18 VITALS — BP 117/71 | HR 90 | Resp 20 | Ht 65.0 in | Wt 114.0 lb

## 2022-05-18 DIAGNOSIS — Z952 Presence of prosthetic heart valve: Secondary | ICD-10-CM

## 2022-05-18 DIAGNOSIS — R0602 Shortness of breath: Secondary | ICD-10-CM | POA: Diagnosis not present

## 2022-05-18 DIAGNOSIS — J90 Pleural effusion, not elsewhere classified: Secondary | ICD-10-CM

## 2022-05-18 DIAGNOSIS — I351 Nonrheumatic aortic (valve) insufficiency: Secondary | ICD-10-CM

## 2022-05-18 NOTE — Patient Instructions (Signed)
A referral was made for right thoracentesis by interventional radiology at the hospital.  We will be in contact with you regarding timing of the procedure.  No change in medications.

## 2022-05-19 DIAGNOSIS — Z952 Presence of prosthetic heart valve: Secondary | ICD-10-CM | POA: Diagnosis not present

## 2022-05-19 NOTE — Progress Notes (Signed)
Daily Session Note  Patient Details  Name: Patricia [name] MRN: [id number] Date of Birth: June 16, 1951 Referring Provider:   Flowsheet Row Cardiac Rehab from 05/12/2022 in [facility] Cardiac and Pulmonary Rehab  Referring Provider Eleonore Chiquito MD       Encounter Date: 05/19/2022  Check In:  Session Check In - 05/19/22 0924       Check-In   Supervising physician immediately available to respond to emergencies See telemetry face sheet for immediately available ER MD    Location [facility]-Cardiac & Pulmonary Rehab    Staff Present Earlean Shawl, BS, ACSM CEP, Exercise Physiologist;Kara Eliezer Bottom, MS, ASCM CEP, Exercise Physiologist;Odalis Jordan Rosalia Hammers, MPA, RN    Virtual Visit No    Medication changes reported     No    Fall or balance concerns reported    No    Tobacco Cessation No Change    Warm-up and Cool-down Performed on first and last piece of equipment    Resistance Training Performed Yes    VAD Patient? No    PAD/SET Patient? No      Pain Assessment   Currently in Pain? No/denies                Social History   Tobacco Use  Smoking Status Never  Smokeless Tobacco Never    Goals Met:  Independence with exercise equipment Exercise tolerated well No report of concerns or symptoms today Strength training completed today  Goals Unmet:  Not Applicable  Comments: Pt able to follow exercise prescription today without complaint.  Will continue to monitor for progression.    Dr. Emily Filbert is Medical Director for [place].  Dr. Ottie Glazier is Medical Director for [facility] Pulmonary Rehabilitation.

## 2022-05-20 ENCOUNTER — Encounter: Payer: Medicare PPO | Admitting: *Deleted

## 2022-05-20 DIAGNOSIS — Z952 Presence of prosthetic heart valve: Secondary | ICD-10-CM

## 2022-05-20 NOTE — Progress Notes (Signed)
Daily Session Note  Patient Details  Name: Patricia [name] MRN: [id number] Date of Birth: [birth year] Referring Provider:   Flowsheet Row Cardiac Rehab from 05/12/2022 in [facility]  Referring Provider Eleonore Chiquito MD       Encounter Date: 05/20/2022  Check In:  Session Check In - 05/20/22 1017       Check-In   Supervising physician immediately available to respond to emergencies See telemetry face sheet for immediately available ER MD    Location [facility]    Staff Present Heath Lark, RN, BSN, CCRP;Jessica Luis Lopez, MA, RCEP, CCRP, CCET;Joseph [place], [place]    Virtual Visit No    Medication changes reported     No    Fall or balance concerns reported    No    Warm-up and Cool-down Performed on first and last piece of equipment    Resistance Training Performed Yes    VAD Patient? No    PAD/SET Patient? No      Pain Assessment   Currently in Pain? No/denies                Social History   Tobacco Use  Smoking Status Never  Smokeless Tobacco Never    Goals Met:  Independence with exercise equipment Exercise tolerated well No report of concerns or symptoms today  Goals Unmet:  Not Applicable  Comments: Pt able to follow exercise prescription today without complaint.  Will continue to monitor for progression.    Dr. Emily Filbert is Medical Director for [facility].  Dr. Ottie Glazier is Medical Director for [facility].

## 2022-05-22 ENCOUNTER — Encounter (HOSPITAL_COMMUNITY): Payer: Self-pay

## 2022-05-22 ENCOUNTER — Other Ambulatory Visit: Payer: Self-pay

## 2022-05-22 ENCOUNTER — Emergency Department (HOSPITAL_COMMUNITY): Payer: Medicare PPO

## 2022-05-22 ENCOUNTER — Inpatient Hospital Stay (HOSPITAL_COMMUNITY)
Admission: EM | Admit: 2022-05-22 | Discharge: 2022-05-28 | DRG: 187 | Disposition: A | Discharge status: Home / Self Care | Payer: Medicare PPO | Attending: Internal Medicine | Admitting: Internal Medicine

## 2022-05-22 DIAGNOSIS — R0602 Shortness of breath: Secondary | ICD-10-CM | POA: Diagnosis present

## 2022-05-22 DIAGNOSIS — K219 Gastro-esophageal reflux disease without esophagitis: Secondary | ICD-10-CM | POA: Diagnosis present

## 2022-05-22 DIAGNOSIS — Z950 Presence of cardiac pacemaker: Secondary | ICD-10-CM

## 2022-05-22 DIAGNOSIS — Z833 Family history of diabetes mellitus: Secondary | ICD-10-CM

## 2022-05-22 DIAGNOSIS — Z882 Allergy status to sulfonamides status: Secondary | ICD-10-CM

## 2022-05-22 DIAGNOSIS — Z880 Allergy status to penicillin: Secondary | ICD-10-CM

## 2022-05-22 DIAGNOSIS — Z85828 Personal history of other malignant neoplasm of skin: Secondary | ICD-10-CM

## 2022-05-22 DIAGNOSIS — J9811 Atelectasis: Secondary | ICD-10-CM | POA: Diagnosis not present

## 2022-05-22 DIAGNOSIS — I471 Supraventricular tachycardia: Secondary | ICD-10-CM | POA: Diagnosis present

## 2022-05-22 DIAGNOSIS — R636 Underweight: Secondary | ICD-10-CM | POA: Diagnosis present

## 2022-05-22 DIAGNOSIS — I970 Postcardiotomy syndrome: Secondary | ICD-10-CM | POA: Diagnosis present

## 2022-05-22 DIAGNOSIS — Z8572 Personal history of non-Hodgkin lymphomas: Secondary | ICD-10-CM | POA: Diagnosis not present

## 2022-05-22 DIAGNOSIS — R0600 Dyspnea, unspecified: Secondary | ICD-10-CM | POA: Diagnosis not present

## 2022-05-22 DIAGNOSIS — Z886 Allergy status to analgesic agent status: Secondary | ICD-10-CM

## 2022-05-22 DIAGNOSIS — Z923 Personal history of irradiation: Secondary | ICD-10-CM

## 2022-05-22 DIAGNOSIS — R7303 Prediabetes: Secondary | ICD-10-CM | POA: Diagnosis present

## 2022-05-22 DIAGNOSIS — Z952 Presence of prosthetic heart valve: Secondary | ICD-10-CM

## 2022-05-22 DIAGNOSIS — I272 Pulmonary hypertension, unspecified: Secondary | ICD-10-CM | POA: Diagnosis present

## 2022-05-22 DIAGNOSIS — M810 Age-related osteoporosis without current pathological fracture: Secondary | ICD-10-CM | POA: Diagnosis present

## 2022-05-22 DIAGNOSIS — D72829 Elevated white blood cell count, unspecified: Secondary | ICD-10-CM | POA: Diagnosis present

## 2022-05-22 DIAGNOSIS — Z7901 Long term (current) use of anticoagulants: Secondary | ICD-10-CM

## 2022-05-22 DIAGNOSIS — I442 Atrioventricular block, complete: Secondary | ICD-10-CM | POA: Diagnosis present

## 2022-05-22 DIAGNOSIS — Z9012 Acquired absence of left breast and nipple: Secondary | ICD-10-CM | POA: Diagnosis not present

## 2022-05-22 DIAGNOSIS — N1831 Chronic kidney disease, stage 3a: Secondary | ICD-10-CM | POA: Diagnosis present

## 2022-05-22 DIAGNOSIS — Z91018 Allergy to other foods: Secondary | ICD-10-CM

## 2022-05-22 DIAGNOSIS — Z821 Family history of blindness and visual loss: Secondary | ICD-10-CM

## 2022-05-22 DIAGNOSIS — I472 Ventricular tachycardia, unspecified: Secondary | ICD-10-CM | POA: Diagnosis present

## 2022-05-22 DIAGNOSIS — Z800 Family history of malignant neoplasm of digestive organs: Secondary | ICD-10-CM

## 2022-05-22 DIAGNOSIS — I130 Hypertensive heart and chronic kidney disease with heart failure and stage 1 through stage 4 chronic kidney disease, or unspecified chronic kidney disease: Secondary | ICD-10-CM | POA: Diagnosis present

## 2022-05-22 DIAGNOSIS — J90 Pleural effusion, not elsewhere classified: Principal | ICD-10-CM | POA: Diagnosis present

## 2022-05-22 DIAGNOSIS — Z8261 Family history of arthritis: Secondary | ICD-10-CM

## 2022-05-22 DIAGNOSIS — J449 Chronic obstructive pulmonary disease, unspecified: Secondary | ICD-10-CM | POA: Diagnosis present

## 2022-05-22 DIAGNOSIS — I4892 Unspecified atrial flutter: Secondary | ICD-10-CM | POA: Diagnosis present

## 2022-05-22 DIAGNOSIS — Z9221 Personal history of antineoplastic chemotherapy: Secondary | ICD-10-CM | POA: Diagnosis not present

## 2022-05-22 DIAGNOSIS — N179 Acute kidney failure, unspecified: Secondary | ICD-10-CM | POA: Diagnosis not present

## 2022-05-22 DIAGNOSIS — Z681 Body mass index (BMI) 19 or less, adult: Secondary | ICD-10-CM

## 2022-05-22 DIAGNOSIS — I480 Paroxysmal atrial fibrillation: Secondary | ICD-10-CM | POA: Diagnosis present

## 2022-05-22 DIAGNOSIS — Z8249 Family history of ischemic heart disease and other diseases of the circulatory system: Secondary | ICD-10-CM

## 2022-05-22 DIAGNOSIS — Z7983 Long term (current) use of bisphosphonates: Secondary | ICD-10-CM

## 2022-05-22 DIAGNOSIS — Z853 Personal history of malignant neoplasm of breast: Secondary | ICD-10-CM

## 2022-05-22 DIAGNOSIS — Z79899 Other long term (current) drug therapy: Secondary | ICD-10-CM

## 2022-05-22 DIAGNOSIS — I10 Essential (primary) hypertension: Secondary | ICD-10-CM | POA: Diagnosis not present

## 2022-05-22 DIAGNOSIS — Z881 Allergy status to other antibiotic agents status: Secondary | ICD-10-CM

## 2022-05-22 DIAGNOSIS — Z953 Presence of xenogenic heart valve: Secondary | ICD-10-CM

## 2022-05-22 DIAGNOSIS — R059 Cough, unspecified: Secondary | ICD-10-CM | POA: Diagnosis not present

## 2022-05-22 DIAGNOSIS — Z79890 Hormone replacement therapy (postmenopausal): Secondary | ICD-10-CM

## 2022-05-22 LAB — BASIC METABOLIC PANEL
Anion gap: 11 (ref 5–15)
BUN: 23 mg/dL (ref 8–23)
CO2: 30 mmol/L (ref 22–32)
Calcium: 9.2 mg/dL (ref 8.9–10.3)
Chloride: 98 mmol/L (ref 98–111)
Creatinine, Ser: 1.52 mg/dL — ABNORMAL HIGH (ref 0.44–1.00)
GFR, Estimated: 36 mL/min — ABNORMAL LOW (ref >60)
Glucose, Bld: 116 mg/dL — ABNORMAL HIGH (ref 70–99)
Potassium: 4.1 mmol/L (ref 3.5–5.1)
Sodium: 139 mmol/L (ref 135–145)

## 2022-05-22 LAB — PROTIME-INR
INR: 1.8 — ABNORMAL HIGH (ref 0.8–1.2)
Prothrombin Time: 21.1 seconds — ABNORMAL HIGH (ref 11.4–15.2)

## 2022-05-22 LAB — CBC
HCT: 37.1 % (ref 36.0–46.0)
Hemoglobin: 11.5 g/dL — ABNORMAL LOW (ref 12.0–15.0)
MCH: 27.1 pg (ref 26.0–34.0)
MCHC: 31 g/dL (ref 30.0–36.0)
MCV: 87.3 fL (ref 80.0–100.0)
Platelets: 446 10*3/uL — ABNORMAL HIGH (ref 150–400)
RBC: 4.25 MIL/uL (ref 3.87–5.11)
RDW: 16.3 % — ABNORMAL HIGH (ref 11.5–15.5)
WBC: 18.6 10*3/uL — ABNORMAL HIGH (ref 4.0–10.5)
nRBC: 0.1 % (ref 0.0–0.2)

## 2022-05-22 LAB — BRAIN NATRIURETIC PEPTIDE: B Natriuretic Peptide: 494.6 pg/mL — ABNORMAL HIGH (ref 0.0–100.0)

## 2022-05-22 LAB — TROPONIN I (HIGH SENSITIVITY)
Troponin I (High Sensitivity): 15 ng/L (ref <18)
Troponin I (High Sensitivity): 14 ng/L (ref <18)

## 2022-05-22 MED ORDER — FUROSEMIDE 40 MG PO TABS
40.0000 mg | ORAL_TABLET | Freq: Every day | ORAL | Status: DC
  Administered 2022-05-24: 40 mg via ORAL
  Filled 2022-05-22: qty 2
  Filled 2022-05-22: qty 1

## 2022-05-22 MED ORDER — LEVOTHYROXINE SODIUM 50 MCG PO TABS
50.0000 ug | ORAL_TABLET | Freq: Every day | ORAL | Status: DC
  Administered 2022-05-23 – 2022-05-28 (×6): 50 ug via ORAL
  Filled 2022-05-22: qty 1
  Filled 2022-05-22: qty 2
  Filled 2022-05-22 (×4): qty 1

## 2022-05-22 MED ORDER — POTASSIUM CHLORIDE 20 MEQ PO PACK
20.0000 meq | PACK | Freq: Every day | ORAL | Status: DC
  Administered 2022-05-24: 20 meq via ORAL
  Filled 2022-05-22 (×2): qty 1

## 2022-05-22 MED ORDER — METOPROLOL TARTRATE 25 MG PO TABS
25.0000 mg | ORAL_TABLET | Freq: Two times a day (BID) | ORAL | Status: DC
  Administered 2022-05-22 – 2022-05-23 (×2): 25 mg via ORAL
  Filled 2022-05-22 (×2): qty 1

## 2022-05-22 MED ORDER — AMIODARONE HCL 100 MG PO TABS
100.0000 mg | ORAL_TABLET | Freq: Every day | ORAL | Status: DC
  Administered 2022-05-23 – 2022-05-28 (×6): 100 mg via ORAL
  Filled 2022-05-22 (×6): qty 1

## 2022-05-22 MED ORDER — PANCRELIPASE (LIP-PROT-AMYL) 12000-38000 UNITS PO CPEP
36000.0000 [IU] | ORAL_CAPSULE | Freq: Three times a day (TID) | ORAL | Status: DC
  Administered 2022-05-23 – 2022-05-28 (×15): 36000 [IU] via ORAL
  Filled 2022-05-22 (×2): qty 1
  Filled 2022-05-22 (×10): qty 3
  Filled 2022-05-22: qty 1
  Filled 2022-05-22 (×3): qty 3
  Filled 2022-05-22: qty 1

## 2022-05-22 MED ORDER — ATORVASTATIN CALCIUM 10 MG PO TABS
20.0000 mg | ORAL_TABLET | Freq: Every day | ORAL | Status: DC
  Administered 2022-05-23: 20 mg via ORAL
  Administered 2022-05-24: 10 mg via ORAL
  Administered 2022-05-25 – 2022-05-28 (×4): 20 mg via ORAL
  Filled 2022-05-22 (×6): qty 2

## 2022-05-22 MED ORDER — PANTOPRAZOLE SODIUM 40 MG PO TBEC
40.0000 mg | DELAYED_RELEASE_TABLET | Freq: Every day | ORAL | Status: DC
  Administered 2022-05-23 – 2022-05-28 (×6): 40 mg via ORAL
  Filled 2022-05-22 (×6): qty 1

## 2022-05-22 NOTE — H&P (Incomplete)
History and Physical    Patricia [name] YQM:[id number] DOB: Jan 03, 1951 DOA: 05/22/2022  PCP: Susy Frizzle, MD  Patient coming from: Home.  Chief Complaint: Shortness of breath.  HPI: Patricia [name] is a 71 y.o. female with history of complete heart block status post pacemaker placement, history of Hodgkin's lymphoma history of breast cancer, recent bioprosthetic aortic valve replacement has been experiencing increasing shortness of breath and had followed up with her cardiothoracic surgeon Dr. Caffie Pinto who had x-rays showed pleural effusion and is planned to have a thoracentesis done on May 23, 2022.  But since patient's shortness of breath got worse patient presented to the ER.  Denies any chest pain fever chills or productive cough.  ED Course: In the ER x-rays confirm worsening right-sided pleural effusion.  ER physician did discuss with on-call cardiothoracic surgeon Dr. Roxan Hockey.  Plan is to admit patient for observation and get thoracentesis in the morning.  Labs do show some leukocytosis.  INR is pending.  Review of Systems: As per HPI, rest all negative.   Past Medical History:  Diagnosis Date   Atrial flutter ([place])    Ablated 1998 Dr. Caryl Comes   Basal cell carcinoma 06/05/2019   R low back, [facility] 07/16/19   Basal cell carcinoma 04/15/2010   Right post shoulder   Basal cell carcinoma 04/15/2010   Mid back   Basal cell carcinoma (BCC) 09/29/2016   Left post shoulder. Superficial and nodular.   BCC (basal cell carcinoma) 07/21/2021   right mid back, [facility] 09/15/2021   BCC (basal cell carcinoma) 01/27/2022   left lower abdomen   Bleeding gastric ulcer    back in 2000   Breast cancer ([place])    COPD (chronic obstructive pulmonary disease) ([place])    from radiation   GERD (gastroesophageal reflux disease)    History of basal cell carcinoma (BCC) 03/22/2021   right neck and right medial ear, Moh's   Hodgkin's lymphoma ([place])    Treated with radiation and chemo   Mitral  regurgitation    pvc   Osteoporosis    lumbar verterbral fracture, left ankle fracture, dexa 2015   Pancreatitis    Pneumonia    Prediabetes    Presence of permanent cardiac pacemaker    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 09/13/2017   25 mm Medtronic Mosaic porcine stented bioprosthetic tissue valve   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   Left medial lower leg   Ulcer     Past Surgical History:  Procedure Laterality Date   AORTIC VALVE REPLACEMENT N/A 04/07/2022   Procedure: AORTIC VALVE REPLACEMENT (AVR), USING INSPIRIS 19 MM AORTIC VALVE;  Surgeon: [name], MD;  Location: [place];  Service: Open Heart Surgery;  Laterality: N/A;   BREAST SURGERY     breast cancer since 2005   left mastectomy   BUBBLE STUDY  01/12/2022   Procedure: BUBBLE STUDY;  Surgeon: Geralynn Rile, MD;  Location: [place];  Service: Cardiovascular;;   CARDIAC ELECTROPHYSIOLOGY [place] VALVE REPLACEMENT N/A    Phreesia 03/19/2021   CATARACT EXTRACTION, BILATERAL     COLONOSCOPY     IR RADIOLOGY PERIPHERAL GUIDED IV START  08/03/2017   IR US GUIDE VASC ACCESS RIGHT  08/03/2017   LAPAROTOMY     MASTECTOMY  2005   Left-axillary   MITRAL VALVE REPAIR Right 09/13/2017   Procedure: MINIMALLY INVASIVE MITRAL VALVE REPLACEMENT (MVR) with Medtronic Mosaic Porcine  Heart Valve size 80mm;  Surgeon: Rexene Alberts, MD;  Location: [place];  Service: Open Heart Surgery;  Laterality: Right;   ORIF ANKLE FRACTURE Left 03/21/2014   Procedure: LEFT ANKLE FRACTURE OPEN TREATMENT BILMALLEOLAR INCLUDES INTERNAL FIXATION ;  Surgeon: Renette Butters, MD;  Location: [place];  Service: Orthopedics;  Laterality: Left;   PACEMAKER IMPLANT N/A 09/19/2017   Medtronic Azure XT MRI conditional dual-chamber pacemaker for symptomatic complete heart block  by Dr Rayann Heman   RIGHT/LEFT HEART CATH AND CORONARY ANGIOGRAPHY N/A 06/27/2017    Procedure: Right/Left Heart Cath and Coronary Angiography;  Surgeon: Larey [name], MD;  Location: [place] [facility];  Service: Cardiovascular;  Laterality: N/A;   SPLENECTOMY  1974   hodgkins disease   TEE WITHOUT CARDIOVERSION  06/12/2012   Procedure: TRANSESOPHAGEAL ECHOCARDIOGRAM (TEE);  Surgeon: Larey [name], MD;  Location: [place];  Service: Cardiovascular;  Laterality: N/A;   TEE WITHOUT CARDIOVERSION N/A 06/29/2017   Procedure: TRANSESOPHAGEAL ECHOCARDIOGRAM (TEE);  Surgeon: Jerline Pain, MD;  Location: [facility] ENDOSCOPY;  Service: Cardiovascular;  Laterality: N/A;   TEE WITHOUT CARDIOVERSION N/A 09/13/2017   Procedure: TRANSESOPHAGEAL ECHOCARDIOGRAM (TEE);  Surgeon: Rexene Alberts, MD;  Location: [place];  Service: Open Heart Surgery;  Laterality: N/A;   TEE WITHOUT CARDIOVERSION N/A 01/12/2022   Procedure: TRANSESOPHAGEAL ECHOCARDIOGRAM (TEE);  Surgeon: Geralynn Rile, MD;  Location: [place];  Service: Cardiovascular;  Laterality: N/A;   TEE WITHOUT CARDIOVERSION N/A 04/07/2022   Procedure: TRANSESOPHAGEAL ECHOCARDIOGRAM (TEE);  Surgeon: [name], MD;  Location: [place];  Service: Open Heart Surgery;  Laterality: N/A;   TONSILLECTOMY AND ADENOIDECTOMY       reports that she has never smoked. She has never used smokeless tobacco. She reports that she does not drink alcohol and does not use drugs.  Allergies  Allergen Reactions   Aspirin Other (See Comments)    Past bleeding ulcer. GI upset   Azithromycin Other (See Comments)    pancreatitis   Penicillins Anaphylaxis, Hives, Swelling and Other (See Comments)    PATIENT HAS HAD A PCN REACTION WITH IMMEDIATE RASH, FACIAL/TONGUE/THROAT SWELLING, SOB, OR LIGHTHEADEDNESS WITH HYPOTENSION:  #  #  #  YES  #  #  #   HAS PT DEVELOPED SEVERE RASH INVOLVING MUCUS MEMBRANES or SKIN NECROSIS: #  #  #  YES  #  #  #  Has patient had a PCN reaction that required hospitalization: No Has patient had a PCN reaction occurring  within the last 10 years: No    Sulfa Antibiotics Hives and Rash   Cheese Nausea And Vomiting    Family History  Problem Relation Age of Onset   Rheumatic fever Father        Died suddenly age 63   Diabetes Father    Cancer Father        colon   Heart disease Father        rheumatic fever x 3   Arthritis Maternal Grandmother    Hypertension Maternal Grandmother    Diabetes Paternal Grandmother    Vision loss Paternal Grandmother        glaucoma    Prior to Admission medications   Medication Sig Start Date End Date Taking? Authorizing Provider  acetaminophen (TYLENOL) 325 MG tablet Take 2 tablets (650 mg total) by mouth 2 (two) times daily as needed for moderate pain or headache. 09/21/17  Yes Lars Pinks M, PA-C  alendronate (FOSAMAX) 70 MG  tablet Take 1 tablet (70 mg total) by mouth once a week. Patient taking differently: Take 70 mg by mouth once a week. Sunday 12/23/21  Yes Susy Frizzle, MD  amiodarone (PACERONE) 200 MG tablet Take 0.5 tablets (100 mg total) by mouth daily. 05/02/22  Yes Shirley Friar, PA-C  atorvastatin (LIPITOR) 20 MG tablet Take 1 tablet (20 mg total) by mouth daily. 04/14/22  Yes Barrett, Erin R, PA-C  Blood Glucose Monitoring Suppl (ACCU-CHEK GUIDE) w/Device KIT 1 Units by Does not apply route daily. 12/25/19  Yes Susy Frizzle, MD  Calcium Carbonate (CALCIUM 600 PO) Take 600 mg by mouth 2 (two) times daily. L[employer] and P[employer] consultant, Historical, MD  CREON 36000-114000 units CPEP capsule Take 36,000 Units by mouth 3 (three) times daily before meals. 12/23/21  Yes [provider]  esomeprazole (NEXIUM) 40 MG capsule Take 40 mg by mouth daily before breakfast.  07/02/15  Yes [provider]  fluticasone (FLONASE) 50 MCG/ACT nasal spray Place 2 sprays into both nostrils daily. Patient taking differently: Place 2 sprays into both nostrils daily as needed for rhinitis. 10/14/21  Yes Susy Frizzle, MD  furosemide  (LASIX) 40 MG tablet Take 40 mg by mouth daily. Can Take Additional Tablet for Weight Gain 3 pound in a Day and 5 pounds in a Week.   Yes [provider]  glucose blood (ACCU-CHEK GUIDE) test strip USE TO TEST BLOOD SUGARS EVERY MORNING 01/22/21  Yes Susy Frizzle, MD  Lancets (ACCU-CHEK SOFT TOUCH) lancets Check BS QAM 07/08/20  Yes Susy Frizzle, MD  Lancets Misc. (ACCU-CHEK SOFTCLIX LANCET DEV) KIT Check BS QAM 12/25/19  Yes Susy Frizzle, MD  levothyroxine (SYNTHROID) 50 MCG tablet TAKE 1 TABLET BY MOUTH ONCE A DAY Patient taking differently: Take 50 mcg by mouth daily before breakfast. 03/17/22  Yes Susy Frizzle, MD  metoprolol tartrate (LOPRESSOR) 25 MG tablet Take 1 tablet (25 mg total) by mouth 2 (two) times daily. 04/14/22  Yes Barrett, Erin R, PA-C  Multiple Vitamin (MULTIVITAMIN WITH MINERALS) TABS tablet Take 1 tablet by mouth daily with lunch. Centrum Silver.   Yes [provider]  potassium chloride (KLOR-CON) 20 MEQ packet Take 20 mEq by mouth once for 1 dose. Patient taking differently: Take 20 mEq by mouth daily. 04/26/22 05/22/22 Yes Warren Lacy, PA-C  traMADol (ULTRAM) 50 MG tablet Take 1 tablet (50 mg total) by mouth every 6 (six) hours as needed for moderate pain. 04/14/22  Yes Barrett, Erin R, PA-C  warfarin (COUMADIN) 5 MG tablet Take 1 tablet (5 mg total) by mouth daily. Patient taking differently: Take 2.5-5 mg by mouth See admin instructions. 5 mg on Tuesday,Thursday,Saturday,Sunday 2.5 mg on Monday,Wednesday,friday 04/14/22 04/14/23 Yes Barrett, Lodema Hong, PA-C    Physical Exam: Constitutional: Moderately built and nourished. Vitals:   05/22/22 2015 05/22/22 2030 05/22/22 2045 05/22/22 2100  BP: (!) 155/77 (!) 150/71 (!) 155/72 (!) 149/77  Pulse: (!) 47 99 (!) 101 99  Resp: (!) $RemoveB'24 19 19 20  'GUuIdcdn$ Temp:      TempSrc:      SpO2: 100% 100% 100% 100%   Eyes: Anicteric no pallor. ENMT: No discharge from the ears eyes nose and mouth. Neck: No mass  felt.  No neck rigidity. Respiratory: No rhonchi or crepitations. Cardiovascular: S1-S2 heard. Abdomen: Soft nontender bowel sound present. Musculoskeletal: Mild edema of the lower extremities. Skin: No rash. Neurologic: Alert awake oriented time place and person.  Moves all extremities. Psychiatric: Appears normal.  Normal affect.   Labs on Admission: I have personally reviewed following labs and imaging studies  CBC: Recent Labs  Lab 05/22/22 1659  WBC 18.6*  HGB 11.5*  HCT 37.1  MCV 87.3  PLT 374*   Basic Metabolic Panel: Recent Labs  Lab 05/22/22 1659  NA 139  K 4.1  CL 98  CO2 30  GLUCOSE 116*  BUN 23  CREATININE 1.52*  CALCIUM 9.2   GFR: Estimated Creatinine Clearance: 27.7 mL/min (A) (by C-G formula based on SCr of 1.52 mg/dL (H)). Liver Function Tests: No results for input(s): "AST", "ALT", "ALKPHOS", "BILITOT", "PROT", "ALBUMIN" in the last 168 hours. No results for input(s): "LIPASE", "AMYLASE" in the last 168 hours. No results for input(s): "AMMONIA" in the last 168 hours. Coagulation Profile: Recent Labs  Lab 05/22/22 2120  INR 1.8*   Cardiac Enzymes: No results for input(s): "CKTOTAL", "CKMB", "CKMBINDEX", "TROPONINI" in the last 168 hours. BNP (last 3 results) No results for input(s): "PROBNP" in the last 8760 hours. HbA1C: No results for input(s): "HGBA1C" in the last 72 hours. CBG: No results for input(s): "GLUCAP" in the last 168 hours. Lipid Profile: No results for input(s): "CHOL", "HDL", "LDLCALC", "TRIG", "CHOLHDL", "LDLDIRECT" in the last 72 hours. Thyroid Function Tests: No results for input(s): "TSH", "T4TOTAL", "FREET4", "T3FREE", "THYROIDAB" in the last 72 hours. Anemia Panel: No results for input(s): "VITAMINB12", "FOLATE", "FERRITIN", "TIBC", "IRON", "RETICCTPCT" in the last 72 hours. Urine analysis:    Component Value Date/Time   COLORURINE YELLOW 04/05/2022 [place] 04/05/2022 1159   LABSPEC >1.046 (H)  04/05/2022 1159   PHURINE 6.0 04/05/2022 1159   GLUCOSEU NEGATIVE 04/05/2022 1159   HGBUR NEGATIVE 04/05/2022 1159   BILIRUBINUR NEGATIVE 04/05/2022 1159   KETONESUR NEGATIVE 04/05/2022 1159   PROTEINUR NEGATIVE 04/05/2022 1159   UROBILINOGEN 0.2 12/25/2014 0023   NITRITE NEGATIVE 04/05/2022 1159   LEUKOCYTESUR NEGATIVE 04/05/2022 1159   Sepsis Labs: $RemoveBefo'[email or web address]'[id number]$ (procalcitonin:4,lacticidven:4) )No results found for this or any previous visit (from the past 240 hour(s)).   Radiological Exams on Admission: DG Chest 2 View  Result Date: 05/22/2022 CLINICAL DATA:  Shortness of breath EXAM: CHEST - 2 VIEW COMPARISON:  05/18/2022 FINDINGS: Cardiomegaly status post median sternotomy with left chest multi lead pacer and aortic valve prosthesis. Unchanged moderate bilateral pleural effusions and associated atelectasis or consolidation. Mild disc degenerative disease of the thoracic spine. IMPRESSION: Unchanged moderate bilateral pleural effusions and associated atelectasis or consolidation. Findings are most consistent with edema. No acute appearing airspace opacity. Electronically Signed   By: Delanna Ahmadi M.D.   On: 05/22/2022 17:38    EKG: Independently reviewed.  Paced rhythm.  Assessment/Plan Principal Problem:   Pleural effusion Active Problems:   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   HTN (hypertension)   Pulmonary HTN (HCC)   Long term (current) use of anticoagulants [Z79.01]   Atrial flutter (HCC)   S/P aortic valve replacement    Right-sided pleural effusion causing shortness of breath and had thoracentesis in the morning cardiothoracic surgery was made aware.  We will have further plans based on the thoracentesis fluid.  Likely transudative. Leukocytosis appears to be chronic.  Closely monitor.  Follow thoracentesis fluid. Chronic kidney disease stage III creatinine appears to be at baseline follow metabolic panel. Recent bioprosthetic aortic  valve replacement and prior history of mitral valve replacement.  Does take Lasix for lower extremity edema. History of atrial fibrillation and NSVT  on metoprolol and amiodarone.  Holding Coumadin in anticipation of thoracentesis per INR pending. Pulmonary hypertension. History of Hodgkin's lymphoma status post radiation and chemotherapy and history of breast cancer. History of complete heart block status post pacemaker placement.   DVT prophylaxis: SCDs.  Patient takes Coumadin INR is pending. Code Status: Full code. Family Communication: Discussed with patient. Disposition Plan: Home. Consults called: Cardiothoracic surgery. Admission status: Observation.   Rise Patience MD [facility] Pager [phone].  If 7PM-7AM, please contact night-coverage [email or web address] Password [facility]  05/22/2022, 10:44 PM

## 2022-05-22 NOTE — ED Provider Triage Note (Signed)
Emergency Medicine Provider Triage Evaluation Note  ELVERNA CAFFEE , a 71 y.o. female  was evaluated in triage. Prior hx of aortic valve replacement 2 months ago by Dr. Cyndia Bent. Pt complains of sob of breath x 2 days. Had outpatient x-rays done yesterday which showed pleural effusions.  She is scheduled for an IR drainage, however shortness of breath has worsened over the last 24 hours.  Review of Systems  Positive: Shortness of breath, Negative: Chest pain  Physical Exam  There were no vitals taken for this visit. Gen:   Awake, no distress   Resp:  Normal effort  MSK:   Moves extremities without difficulty Other:    Medical Decision Making  Medically screening exam initiated at 4:58 PM.  Appropriate orders placed.  MAYTE DIERS was informed that the remainder of the evaluation will be completed by another provider, this initial triage assessment does not replace that evaluation, and the importance of remaining in the ED until their evaluation is complete.     Janeece Fitting, PA-C 05/22/22 1710

## 2022-05-22 NOTE — ED Notes (Signed)
Oxygen tank replaced °

## 2022-05-22 NOTE — ED Triage Notes (Signed)
Patient had open heart surg April 27th and has fluid in lungs.  Was suppose to get it pulled off tomorrow but sob has got worse and requiring oxygen.  Patient has hoarse  voice.

## 2022-05-22 NOTE — ED Provider Notes (Incomplete)
[facility] EMERGENCY DEPARTMENT Provider Note   CSN: [id number] Arrival date & time: 05/22/22  1653     History {Add pertinent medical, surgical, social history, OB history to HPI:1} Chief Complaint  Patient presents with  . Shortness of Breath    Patricia [name] is a 71 y.o. female.  71 yo female on coumadin for aortic valve replacement presents via EMS with SHOB, O2 sat 91% on RA with EMS, improved to upper 90s with patient reporting improvement in her [facility]. Aortic valve replaced 04/07/22 with Dr. Cyndia Bent, [facility] progressively worsening, no improvement with lasix. Seen in clinic on 05/18/22 with bilateral pleural effusions with plan for right thoracentesis tomorrow (05/23/22). Denies fevers, chills, chest pain. Does have cough which is not worse that usual. Last took her coumadin this morning, not required to hold meds for her upcoming procedure.        Home Medications Prior to Admission medications   Medication Sig Start Date End Date Taking? Authorizing Provider  acetaminophen (TYLENOL) 325 MG tablet Take 2 tablets (650 mg total) by mouth 2 (two) times daily as needed for moderate pain or headache. 09/21/17  Yes Lars Pinks M, PA-C  alendronate (FOSAMAX) 70 MG tablet Take 1 tablet (70 mg total) by mouth once a week. Patient taking differently: Take 70 mg by mouth once a week. Sunday 12/23/21  Yes Susy Frizzle, MD  amiodarone (PACERONE) 200 MG tablet Take 0.5 tablets (100 mg total) by mouth daily. 05/02/22  Yes Shirley Friar, PA-C  atorvastatin (LIPITOR) 20 MG tablet Take 1 tablet (20 mg total) by mouth daily. 04/14/22  Yes Barrett, Erin R, PA-C  Blood Glucose Monitoring Suppl (ACCU-CHEK GUIDE) w/Device KIT 1 Units by Does not apply route daily. 12/25/19  Yes Susy Frizzle, MD  Calcium Carbonate (CALCIUM 600 PO) Take 600 mg by mouth 2 (two) times daily. L[employer] and P[employer] consultant, Historical, MD  CREON 36000-114000 units CPEP capsule Take  36,000 Units by mouth 3 (three) times daily before meals. 12/23/21  Yes [provider]  esomeprazole (NEXIUM) 40 MG capsule Take 40 mg by mouth daily before breakfast.  07/02/15  Yes [provider]  fluticasone (FLONASE) 50 MCG/ACT nasal spray Place 2 sprays into both nostrils daily. Patient taking differently: Place 2 sprays into both nostrils daily as needed for rhinitis. 10/14/21  Yes Susy Frizzle, MD  furosemide (LASIX) 40 MG tablet Take 40 mg by mouth daily. Can Take Additional Tablet for Weight Gain 3 pound in a Day and 5 pounds in a Week.   Yes [provider]  glucose blood (ACCU-CHEK GUIDE) test strip USE TO TEST BLOOD SUGARS EVERY MORNING 01/22/21  Yes Susy Frizzle, MD  Lancets (ACCU-CHEK SOFT TOUCH) lancets Check BS QAM 07/08/20  Yes Susy Frizzle, MD  Lancets Misc. (ACCU-CHEK SOFTCLIX LANCET DEV) KIT Check BS QAM 12/25/19  Yes Susy Frizzle, MD  levothyroxine (SYNTHROID) 50 MCG tablet TAKE 1 TABLET BY MOUTH ONCE A DAY Patient taking differently: Take 50 mcg by mouth daily before breakfast. 03/17/22  Yes Susy Frizzle, MD  metoprolol tartrate (LOPRESSOR) 25 MG tablet Take 1 tablet (25 mg total) by mouth 2 (two) times daily. 04/14/22  Yes Barrett, Erin R, PA-C  Multiple Vitamin (MULTIVITAMIN WITH MINERALS) TABS tablet Take 1 tablet by mouth daily with lunch. Centrum Silver.   Yes [provider]  potassium chloride (KLOR-CON) 20 MEQ packet Take 20 mEq by mouth once for 1  dose. Patient taking differently: Take 20 mEq by mouth daily. 04/26/22 05/22/22 Yes Warren Lacy, PA-C  traMADol (ULTRAM) 50 MG tablet Take 1 tablet (50 mg total) by mouth every 6 (six) hours as needed for moderate pain. 04/14/22  Yes Barrett, Erin R, PA-C  warfarin (COUMADIN) 5 MG tablet Take 1 tablet (5 mg total) by mouth daily. Patient taking differently: Take 2.5-5 mg by mouth See admin instructions. 5 mg on Tuesday,Thursday,Saturday,Sunday 2.5 mg on  Monday,Wednesday,friday 04/14/22 04/14/23 Yes Barrett, Erin R, PA-C      Allergies    Aspirin, Azithromycin, Penicillins, Sulfa antibiotics, and Cheese    Review of Systems   Review of Systems Negative except as per HPI Physical Exam Updated Vital Signs BP (!) 149/81   Pulse 99   Temp 99.6 F (37.6 C) (Oral)   Resp 17   SpO2 100%  Physical Exam Vitals and nursing note reviewed.  Constitutional:      General: She is not in acute distress.    Appearance: She is well-developed. She is not diaphoretic.  HENT:     Head: Normocephalic and atraumatic.  Cardiovascular:     Rate and Rhythm: Normal rate and regular rhythm.     Heart sounds: Murmur heard.  Pulmonary:     Effort: Tachypnea present.     Breath sounds: Examination of the right-middle field reveals decreased breath sounds. Examination of the left-middle field reveals decreased breath sounds. Examination of the right-lower field reveals decreased breath sounds. Examination of the left-lower field reveals decreased breath sounds. Decreased breath sounds present. No wheezing.  Musculoskeletal:     Cervical back: Neck supple.     Right lower leg: Edema present.     Left lower leg: Edema present.  Neurological:     Mental Status: She is alert and oriented to person, place, and time.  Psychiatric:        Behavior: Behavior normal.     ED Results / Procedures / Treatments   Labs (all labs ordered are listed, but only abnormal results are displayed) Labs Reviewed  BASIC METABOLIC PANEL - Abnormal; Notable for the following components:      Result Value   Glucose, Bld 116 (*)    Creatinine, Ser 1.52 (*)    GFR, Estimated 36 (*)    All other components within normal limits  CBC - Abnormal; Notable for the following components:   WBC 18.6 (*)    Hemoglobin 11.5 (*)    RDW 16.3 (*)    Platelets 446 (*)    All other components within normal limits  BRAIN NATRIURETIC PEPTIDE - Abnormal; Notable for the following components:    B Natriuretic Peptide 494.6 (*)    All other components within normal limits  PROTIME-INR - Abnormal; Notable for the following components:   Prothrombin Time 21.1 (*)    INR 1.8 (*)    All other components within normal limits  PROTIME-INR  CBC  BASIC METABOLIC PANEL  TROPONIN I (HIGH SENSITIVITY)  TROPONIN I (HIGH SENSITIVITY)    EKG None  Radiology DG Chest 2 View  Result Date: 05/22/2022 CLINICAL DATA:  Shortness of breath EXAM: CHEST - 2 VIEW COMPARISON:  05/18/2022 FINDINGS: Cardiomegaly status post median sternotomy with left chest multi lead pacer and aortic valve prosthesis. Unchanged moderate bilateral pleural effusions and associated atelectasis or consolidation. Mild disc degenerative disease of the thoracic spine. IMPRESSION: Unchanged moderate bilateral pleural effusions and associated atelectasis or consolidation. Findings are most consistent with edema. No  acute appearing airspace opacity. Electronically Signed   By: Delanna Ahmadi M.D.   On: 05/22/2022 17:38    Procedures Procedures  {Document cardiac monitor, telemetry assessment procedure when appropriate:1}  Medications Ordered in ED Medications  amiodarone (PACERONE) tablet 100 mg (has no administration in time range)  atorvastatin (LIPITOR) tablet 20 mg (has no administration in time range)  furosemide (LASIX) tablet 40 mg (has no administration in time range)  metoprolol tartrate (LOPRESSOR) tablet 25 mg (25 mg Oral Given 05/22/22 2307)  levothyroxine (SYNTHROID) tablet 50 mcg (has no administration in time range)  lipase/protease/amylase (CREON) capsule 36,000 Units (has no administration in time range)  pantoprazole (PROTONIX) EC tablet 40 mg (has no administration in time range)  potassium chloride (KLOR-CON) packet 20 mEq (has no administration in time range)    ED Course/ Medical Decision Making/ A&P                           Medical Decision Making Amount and/or Complexity of Data Reviewed Labs:  ordered. Radiology: ordered.  Risk Decision regarding hospitalization.   This patient presents to the ED for concern of SHOB secondary to known pleural effusions (bilateral), this involves an extensive number of treatment options, and is a complaint that carries with it a high risk of complications and morbidity.  The differential diagnosis includes but not limited to pleural effusion, pneumonia, CHF   Co morbidities that complicate the patient evaluation  HTN, pulmonary HTN, mitral valve replacement with bioprosthetic valve, aortic valve replacement, pacemaker   Additional history obtained:  Additional history obtained from neighbor at bedside who reports patient more [facility] today than previously  External records from outside source obtained and reviewed including recent office visit to CT surg 05/18/22 plan for right thoracentesis scheduled for 05/23/22. Echo from 05/17/22 with EF 60-65%   Lab Tests:  I Ordered, and personally interpreted labs.  The pertinent results include:  INR 1.8; BNP 494; CBC with leukocytosis WBC 18.6; BMP with Cr 1.52  (slightly elevated from prior 1.2); trop 14   Imaging Studies ordered:  I ordered imaging studies including CXR   I independently visualized and interpreted imaging which showed moderate bilateral pleural effusions  I agree with the radiologist interpretation   Cardiac Monitoring: / EKG:  The patient was maintained on a cardiac monitor.  I personally viewed and interpreted the cardiac monitored which showed an underlying rhythm of: ***   Consultations Obtained:  I requested consultation with the cardiothoracic surgeon, Tennis Must. Roxan Hockey,  and discussed lab and imaging findings as well as pertinent plan - they recommend: admit to hospitalist service, Dr. Caffie Pinto will be made aware of admission ahead of tomorrows procedure.    Problem List / ED Course / Critical interventions / Medication management  71 year old female on coumadin post valve  replacement with complaint of progressively worsening SHOB, now with O2 91% on RA, feeling better with supplemental [place] O2. CXR with bilateral pleural effusions. Plan is for admission overnight for monitoring and supplemental O2, on call CT surgeon Dr. Roxan Hockey to inform Dr. Cyndia Bent of pts admission.  I ordered medication including O2  for [facility]  Reevaluation of the patient after these medicines showed that the patient improved I have reviewed the patients home medicines and have made adjustments as needed   Social Determinants of Health:  Has specialty care in system; lives alone, here with a neighbor   Test / Admission - Considered:  Admit for  monitoring, supplemental O2 and CT surgery consult for thoracentesis tomorrow as previously planned   {Document critical care time when appropriate:1} {Document review of labs and clinical decision tools ie heart score, Chads2Vasc2 etc:1}  {Document your independent review of radiology images, and any outside records:1} {Document your discussion with family members, caretakers, and with consultants:1} {Document social determinants of health affecting pt's care:1} {Document your decision making why or why not admission, treatments were needed:1} Final Clinical Impression(s) / ED Diagnoses Final diagnoses:  Pleural effusion  Shortness of breath    Rx / DC Orders ED Discharge Orders     None

## 2022-05-22 NOTE — ED Provider Notes (Signed)
[facility] EMERGENCY DEPARTMENT Provider Note   CSN: [id number] Arrival date & time: 05/22/22  1653     History  Chief Complaint  Patient presents with   Shortness of Breath    Patricia [name] is a 71 y.o. female.  71 yo female on coumadin for aortic valve replacement presents via EMS with SHOB, O2 sat 91% on RA with EMS, improved to upper 90s with patient reporting improvement in her [facility]. Aortic valve replaced 04/07/22 with Dr. Cyndia Bent, [facility] progressively worsening, no improvement with lasix. Seen in clinic on 05/18/22 with bilateral pleural effusions with plan for right thoracentesis tomorrow (05/23/22). Denies fevers, chills, chest pain. Does have cough which is not worse that usual. Last took her coumadin this morning, not required to hold meds for her upcoming procedure.        Home Medications Prior to Admission medications   Medication Sig Start Date End Date Taking? Authorizing Provider  acetaminophen (TYLENOL) 325 MG tablet Take 2 tablets (650 mg total) by mouth 2 (two) times daily as needed for moderate pain or headache. 09/21/17  Yes Lars Pinks M, PA-C  alendronate (FOSAMAX) 70 MG tablet Take 1 tablet (70 mg total) by mouth once a week. Patient taking differently: Take 70 mg by mouth once a week. Sunday 12/23/21  Yes Susy Frizzle, MD  amiodarone (PACERONE) 200 MG tablet Take 0.5 tablets (100 mg total) by mouth daily. 05/02/22  Yes Shirley Friar, PA-C  atorvastatin (LIPITOR) 20 MG tablet Take 1 tablet (20 mg total) by mouth daily. 04/14/22  Yes Barrett, Erin R, PA-C  Blood Glucose Monitoring Suppl (ACCU-CHEK GUIDE) w/Device KIT 1 Units by Does not apply route daily. 12/25/19  Yes Susy Frizzle, MD  Calcium Carbonate (CALCIUM 600 PO) Take 600 mg by mouth 2 (two) times daily. Lunch and Pensions consultant, Historical, MD  CREON 36000-114000 units CPEP capsule Take 36,000 Units by mouth 3 (three) times daily before meals. 12/23/21  Yes  [provider]  esomeprazole (NEXIUM) 40 MG capsule Take 40 mg by mouth daily before breakfast.  07/02/15  Yes [provider]  fluticasone (FLONASE) 50 MCG/ACT nasal spray Place 2 sprays into both nostrils daily. Patient taking differently: Place 2 sprays into both nostrils daily as needed for rhinitis. 10/14/21  Yes Susy Frizzle, MD  furosemide (LASIX) 40 MG tablet Take 40 mg by mouth daily. Can Take Additional Tablet for Weight Gain 3 pound in a Day and 5 pounds in a Week.   Yes [provider]  glucose blood (ACCU-CHEK GUIDE) test strip USE TO TEST BLOOD SUGARS EVERY MORNING 01/22/21  Yes Susy Frizzle, MD  Lancets (ACCU-CHEK SOFT TOUCH) lancets Check BS QAM 07/08/20  Yes Susy Frizzle, MD  Lancets Misc. (ACCU-CHEK SOFTCLIX LANCET DEV) KIT Check BS QAM 12/25/19  Yes Susy Frizzle, MD  levothyroxine (SYNTHROID) 50 MCG tablet TAKE 1 TABLET BY MOUTH ONCE A DAY Patient taking differently: Take 50 mcg by mouth daily before breakfast. 03/17/22  Yes Susy Frizzle, MD  metoprolol tartrate (LOPRESSOR) 25 MG tablet Take 1 tablet (25 mg total) by mouth 2 (two) times daily. 04/14/22  Yes Barrett, Erin R, PA-C  Multiple Vitamin (MULTIVITAMIN WITH MINERALS) TABS tablet Take 1 tablet by mouth daily with lunch. Centrum Silver.   Yes [provider]  potassium chloride (KLOR-CON) 20 MEQ packet Take 20 mEq by mouth once for 1 dose. Patient taking differently: Take 20 mEq by mouth  daily. 04/26/22 05/22/22 Yes Warren Lacy, PA-C  traMADol (ULTRAM) 50 MG tablet Take 1 tablet (50 mg total) by mouth every 6 (six) hours as needed for moderate pain. 04/14/22  Yes Barrett, Erin R, PA-C  warfarin (COUMADIN) 5 MG tablet Take 1 tablet (5 mg total) by mouth daily. Patient taking differently: Take 2.5-5 mg by mouth See admin instructions. 5 mg on Tuesday,Thursday,Saturday,Sunday 2.5 mg on Monday,Wednesday,friday 04/14/22 04/14/23 Yes Barrett, Erin R, PA-C      Allergies     Aspirin, Azithromycin, Penicillins, Sulfa antibiotics, and Cheese    Review of Systems   Review of Systems Negative except as per HPI Physical Exam Updated Vital Signs BP (!) 149/81   Pulse 99   Temp 99.6 F (37.6 C) (Oral)   Resp 17   SpO2 100%  Physical Exam Vitals and nursing note reviewed.  Constitutional:      General: Patricia [name] is not in acute distress.    Appearance: Patricia [name] is well-developed. Patricia [name] is not diaphoretic.  HENT:     Head: Normocephalic and atraumatic.  Cardiovascular:     Rate and Rhythm: Normal rate and regular rhythm.     Heart sounds: Murmur heard.  Pulmonary:     Effort: Tachypnea present.     Breath sounds: Examination of the right-middle field reveals decreased breath sounds. Examination of the left-middle field reveals decreased breath sounds. Examination of the right-lower field reveals decreased breath sounds. Examination of the left-lower field reveals decreased breath sounds. Decreased breath sounds present. No wheezing.  Musculoskeletal:     Cervical back: Neck supple.     Right lower leg: Edema present.     Left lower leg: Edema present.  Neurological:     Mental Status: Patricia [name] is alert and oriented to person, place, and time.  Psychiatric:        Behavior: Behavior normal.     ED Results / Procedures / Treatments   Labs (all labs ordered are listed, but only abnormal results are displayed) Labs Reviewed  BASIC METABOLIC PANEL - Abnormal; Notable for the following components:      Result Value   Glucose, Bld 116 (*)    Creatinine, Ser 1.52 (*)    GFR, Estimated 36 (*)    All other components within normal limits  CBC - Abnormal; Notable for the following components:   WBC 18.6 (*)    Hemoglobin 11.5 (*)    RDW 16.3 (*)    Platelets 446 (*)    All other components within normal limits  BRAIN NATRIURETIC PEPTIDE - Abnormal; Notable for the following components:   B Natriuretic Peptide 494.6 (*)    All other components within normal limits   PROTIME-INR - Abnormal; Notable for the following components:   Prothrombin Time 21.1 (*)    INR 1.8 (*)    All other components within normal limits  PROTIME-INR  CBC  BASIC METABOLIC PANEL  TROPONIN I (HIGH SENSITIVITY)  TROPONIN I (HIGH SENSITIVITY)    EKG None  Radiology DG Chest 2 View  Result Date: 05/22/2022 CLINICAL DATA:  Shortness of breath EXAM: CHEST - 2 VIEW COMPARISON:  05/18/2022 FINDINGS: Cardiomegaly status post median sternotomy with left chest multi lead pacer and aortic valve prosthesis. Unchanged moderate bilateral pleural effusions and associated atelectasis or consolidation. Mild disc degenerative disease of the thoracic spine. IMPRESSION: Unchanged moderate bilateral pleural effusions and associated atelectasis or consolidation. Findings are most consistent with edema. No acute appearing airspace opacity. Electronically Signed   By:  Delanna Ahmadi M.D.   On: 05/22/2022 17:38    Procedures Procedures    Medications Ordered in ED Medications  amiodarone (PACERONE) tablet 100 mg (has no administration in time range)  atorvastatin (LIPITOR) tablet 20 mg (has no administration in time range)  furosemide (LASIX) tablet 40 mg (has no administration in time range)  metoprolol tartrate (LOPRESSOR) tablet 25 mg (25 mg Oral Given 05/22/22 2307)  levothyroxine (SYNTHROID) tablet 50 mcg (has no administration in time range)  lipase/protease/amylase (CREON) capsule 36,000 Units (has no administration in time range)  pantoprazole (PROTONIX) EC tablet 40 mg (has no administration in time range)  potassium chloride (KLOR-CON) packet 20 mEq (has no administration in time range)    ED Course/ Medical Decision Making/ A&P                           Medical Decision Making Amount and/or Complexity of Data Reviewed Labs: ordered. Radiology: ordered.  Risk Decision regarding hospitalization.   This patient presents to the ED for concern of SHOB secondary to known  pleural effusions (bilateral), this involves an extensive number of treatment options, and is a complaint that carries with it a high risk of complications and morbidity.  The differential diagnosis includes but not limited to pleural effusion, pneumonia, CHF   Co morbidities that complicate the patient evaluation  HTN, pulmonary HTN, mitral valve replacement with bioprosthetic valve, aortic valve replacement, pacemaker   Additional history obtained:  Additional history obtained from neighbor at bedside who reports patient more [facility] today than previously  External records from outside source obtained and reviewed including recent office visit to CT surg 05/18/22 plan for right thoracentesis scheduled for 05/23/22. Echo from 05/17/22 with EF 60-65%   Lab Tests:  I Ordered, and personally interpreted labs.  The pertinent results include:  INR 1.8; BNP 494; CBC with leukocytosis WBC 18.6; BMP with Cr 1.52  (slightly elevated from prior 1.2); trop 14   Imaging Studies ordered:  I ordered imaging studies including CXR   I independently visualized and interpreted imaging which showed moderate bilateral pleural effusions  I agree with the radiologist interpretation   Cardiac Monitoring: / EKG:  The patient was maintained on a cardiac monitor.  I personally viewed and interpreted the cardiac monitored which showed an underlying rhythm of: paced   Consultations Obtained:  I requested consultation with the cardiothoracic surgeon, Tennis Must. Roxan Hockey,  and discussed lab and imaging findings as well as pertinent plan - they recommend: admit to hospitalist service, Dr. Caffie Pinto will be made aware of admission ahead of tomorrows procedure.    Problem List / ED Course / Critical interventions / Medication management  71 year old female on coumadin post valve replacement with complaint of progressively worsening SHOB, now with O2 91% on RA, feeling better with supplemental [place] O2. CXR with bilateral  pleural effusions. Plan is for admission overnight for monitoring and supplemental O2, on call CT surgeon Dr. Roxan Hockey to inform Dr. Cyndia Bent of pts admission.  I ordered medication including O2  for [facility]  Reevaluation of the patient after these medicines showed that the patient improved I have reviewed the patients home medicines and have made adjustments as needed   Social Determinants of Health:  Has specialty care in system; lives alone, here with a neighbor   Test / Admission - Considered:  Admit for monitoring, supplemental O2 and CT surgery consult for thoracentesis tomorrow as previously planned  Final Clinical Impression(s) / ED Diagnoses Final diagnoses:  Pleural effusion  Shortness of breath    Rx / DC Orders ED Discharge Orders     None         Roque Lias 05/23/22 0040    Davonna Belling, MD 05/23/22 [phone]

## 2022-05-23 ENCOUNTER — Inpatient Hospital Stay (HOSPITAL_COMMUNITY)
Admission: RE | Admit: 2022-05-23 | Discharge: 2022-05-23 | Disposition: A | Discharge status: Home / Self Care | Payer: Medicare PPO | Source: Ambulatory Visit | Attending: Surgery | Admitting: Surgery

## 2022-05-23 ENCOUNTER — Encounter (HOSPITAL_COMMUNITY): Payer: Self-pay

## 2022-05-23 ENCOUNTER — Observation Stay (HOSPITAL_COMMUNITY): Payer: Medicare PPO

## 2022-05-23 ENCOUNTER — Other Ambulatory Visit: Payer: Self-pay | Admitting: Surgery

## 2022-05-23 DIAGNOSIS — J90 Pleural effusion, not elsewhere classified: Secondary | ICD-10-CM | POA: Diagnosis not present

## 2022-05-23 DIAGNOSIS — Z952 Presence of prosthetic heart valve: Secondary | ICD-10-CM

## 2022-05-23 HISTORY — PX: IR THORACENTESIS ASP PLEURAL SPACE W/IMG GUIDE: IMG5380

## 2022-05-23 LAB — CBC
HCT: 33.6 % — ABNORMAL LOW (ref 36.0–46.0)
Hemoglobin: 10.6 g/dL — ABNORMAL LOW (ref 12.0–15.0)
MCH: 27.5 pg (ref 26.0–34.0)
MCHC: 31.5 g/dL (ref 30.0–36.0)
MCV: 87.3 fL (ref 80.0–100.0)
Platelets: 393 10*3/uL (ref 150–400)
RBC: 3.85 MIL/uL — ABNORMAL LOW (ref 3.87–5.11)
RDW: 16.1 % — ABNORMAL HIGH (ref 11.5–15.5)
WBC: 17.1 10*3/uL — ABNORMAL HIGH (ref 4.0–10.5)
nRBC: 0.2 % (ref 0.0–0.2)

## 2022-05-23 LAB — BASIC METABOLIC PANEL
Anion gap: 10 (ref 5–15)
BUN: 19 mg/dL (ref 8–23)
CO2: 30 mmol/L (ref 22–32)
Calcium: 9.1 mg/dL (ref 8.9–10.3)
Chloride: 102 mmol/L (ref 98–111)
Creatinine, Ser: 1.32 mg/dL — ABNORMAL HIGH (ref 0.44–1.00)
GFR, Estimated: 43 mL/min — ABNORMAL LOW (ref >60)
Glucose, Bld: 103 mg/dL — ABNORMAL HIGH (ref 70–99)
Potassium: 3.7 mmol/L (ref 3.5–5.1)
Sodium: 142 mmol/L (ref 135–145)

## 2022-05-23 LAB — PROTIME-INR
INR: 1.8 — ABNORMAL HIGH (ref 0.8–1.2)
Prothrombin Time: 21 seconds — ABNORMAL HIGH (ref 11.4–15.2)

## 2022-05-23 MED ORDER — METOPROLOL TARTRATE 12.5 MG HALF TABLET
12.5000 mg | ORAL_TABLET | Freq: Two times a day (BID) | ORAL | Status: DC
  Administered 2022-05-23 – 2022-05-24 (×3): 12.5 mg via ORAL
  Filled 2022-05-23 (×3): qty 1

## 2022-05-23 MED ORDER — WARFARIN - PHARMACIST DOSING INPATIENT: Freq: Every day | Status: DC

## 2022-05-23 MED ORDER — WARFARIN SODIUM 5 MG PO TABS
5.0000 mg | ORAL_TABLET | Freq: Once | ORAL | Status: AC
  Administered 2022-05-23: 5 mg via ORAL
  Filled 2022-05-23 (×3): qty 1

## 2022-05-23 MED ORDER — LIDOCAINE HCL (PF) 1 % IJ SOLN
INTRAMUSCULAR | Status: DC | PRN
  Administered 2022-05-23: 5 mL

## 2022-05-23 MED ORDER — LIDOCAINE HCL 1 % IJ SOLN
INTRAMUSCULAR | Status: AC
  Filled 2022-05-23: qty 20

## 2022-05-23 NOTE — Procedures (Signed)
PROCEDURE SUMMARY:  Successful US guided right thoracentesis. Yielded 700cc of amber fluid. Pt tolerated procedure well. No immediate complications.  Specimen not sent for labs. CXR ordered.  EBL < 5 mL  Sharelle Burditt PA-C 05/23/2022 10:00 AM

## 2022-05-23 NOTE — ED Notes (Signed)
Patient transported to IR 

## 2022-05-23 NOTE — Progress Notes (Signed)
ANTICOAGULATION CONSULT NOTE - Initial Consult  Pharmacy Consult for Warfarin Indication: atrial fibrillation  Allergies  Allergen Reactions   Aspirin Other (See Comments)    Past bleeding ulcer. GI upset   Azithromycin Other (See Comments)    pancreatitis   Penicillins Anaphylaxis, Hives, Swelling and Other (See Comments)    PATIENT HAS HAD A PCN REACTION WITH IMMEDIATE RASH, FACIAL/TONGUE/THROAT SWELLING, SOB, OR LIGHTHEADEDNESS WITH HYPOTENSION:  #  #  #  YES  #  #  #   HAS PT DEVELOPED SEVERE RASH INVOLVING MUCUS MEMBRANES or SKIN NECROSIS: #  #  #  YES  #  #  #  Has patient had a PCN reaction that required hospitalization: No Has patient had a PCN reaction occurring within the last 10 years: No    Sulfa Antibiotics Hives and Rash   Cheese Nausea And Vomiting    Patient Measurements:    Vital Signs: BP: 123/72 (06/12 1050) Pulse Rate: 96 (06/12 1050)  Labs: Recent Labs    05/22/22 1659 05/22/22 2120 05/23/22 0508  HGB 11.5*  --  10.6*  HCT 37.1  --  33.6*  PLT 446*  --  393  LABPROT  --  21.1* 21.0*  INR  --  1.8* 1.8*  CREATININE 1.52*  --  1.32*  TROPONINIHS 15 14  --     Estimated Creatinine Clearance: 31.9 mL/min (A) (by C-G formula based on SCr of 1.32 mg/dL (H)).   Medical History: Past Medical History:  Diagnosis Date   Atrial flutter ([place])    Ablated 1998 Dr. Caryl Comes   Basal cell carcinoma 06/05/2019   R low back, [facility] 07/16/19   Basal cell carcinoma 04/15/2010   Right post shoulder   Basal cell carcinoma 04/15/2010   Mid back   Basal cell carcinoma (BCC) 09/29/2016   Left post shoulder. Superficial and nodular.   BCC (basal cell carcinoma) 07/21/2021   right mid back, [facility] 09/15/2021   BCC (basal cell carcinoma) 01/27/2022   left lower abdomen   Bleeding gastric ulcer    back in 2000   Breast cancer ([place])    COPD (chronic obstructive pulmonary disease) ([place])    from radiation   GERD (gastroesophageal reflux disease)    History of basal cell  carcinoma (BCC) 03/22/2021   right neck and right medial ear, Moh's   Hodgkin's lymphoma ([place])    Treated with radiation and chemo   Mitral regurgitation    pvc   Osteoporosis    lumbar verterbral fracture, left ankle fracture, dexa 2015   Pancreatitis    Pneumonia    Prediabetes    Presence of permanent cardiac pacemaker    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 F presents with R-sided pleural effusion - s/p thoracentesis 6/12. Pt on warfarin PTA for afib. Admission INR 1.8. CBC ok on admission. Last dose 6/10 1830. Home dose: 2.'5mg'$  M/W/F and '5mg'$  T/T/S/S.  Goal of Therapy:  INR 2-3 Monitor platelets by anticoagulation protocol: Yes   Plan:  Daily INR Warfarin '5mg'$  po today  Sherlon Handing, PharmD, BCPS Please see amion for complete clinical pharmacist phone list 05/23/2022,11:40 AM

## 2022-05-23 NOTE — Progress Notes (Signed)
PROGRESS NOTE    Patricia [name]  SWF:[id number] DOB: 01-Jun-1951 DOA: 05/22/2022 PCP: Susy Frizzle, MD    Brief Narrative:  Patricia [name] is a 71 y.o. female with history of complete heart block status post pacemaker placement, history of Hodgkin's lymphoma history of breast cancer, recent bioprosthetic aortic valve replacement has been experiencing increasing shortness of breath and had followed up with her cardiothoracic surgeon Dr. Caffie Pinto who had x-rays showed pleural effusion and is planned to have a thoracentesis done on May 23, 2022. But since patient's shortness of breath got worse patient presented to the ER.   Assessment and Plan: Right-sided pleural effusion causing shortness of breath  -thoracentesis done but no fluid sent off -CVTS consult  Leukocytosis appears to be chronic -daily labs  Chronic kidney disease stage IIIa -creatinine appears to be at baseline  Recent bioprosthetic aortic valve replacement and prior history of mitral valve replacement.  - Does take Lasix for lower extremity edema.  History of atrial fibrillation and NSVT on metoprolol and amiodarone.   -resume coumadin  History of Hodgkin's lymphoma  -status post radiation and chemotherapy and history of breast cancer.  History of complete heart block status post pacemaker placement.     DVT prophylaxis: SCDs Start: 05/22/22 2243    Code Status: Full Code Family Communication:   Disposition Plan:  Level of care: Telemetry Medical Status is: Observation The patient will require care spanning > 2 midnights and should be moved to inpatient because: cvts eval    Consultants:  Cvts IR  Subjective: Short of breath  Objective: Vitals:   05/23/22 0830 05/23/22 0845 05/23/22 1048 05/23/22 1050  BP: (!) 132/56 (!) 120/53 123/72 123/72  Pulse: (!) 105 (!) 101 96 96  Resp: 18 17  (!) 23  Temp:      TempSrc:      SpO2: 100% 100%  100%   No intake or output data in the 24 hours  ending 05/23/22 1059 There were no vitals filed for this visit.  Examination:   General: Appearance:    Thin female in no acute distress     Lungs:     diminished on right side, respirations unlabored  Heart:    Normal heart rate. Normal rhythm.     MS:   All extremities are intact.    Neurologic:   Awake, alert, oriented x 3. No apparent focal neurological           defect.        Data Reviewed: I have personally reviewed following labs and imaging studies  CBC: Recent Labs  Lab 05/22/22 1659 05/23/22 0508  WBC 18.6* 17.1*  HGB 11.5* 10.6*  HCT 37.1 33.6*  MCV 87.3 87.3  PLT 446* 220   Basic Metabolic Panel: Recent Labs  Lab 05/22/22 1659 05/23/22 0508  NA 139 142  K 4.1 3.7  CL 98 102  CO2 30 30  GLUCOSE 116* 103*  BUN 23 19  CREATININE 1.52* 1.32*  CALCIUM 9.2 9.1   GFR: Estimated Creatinine Clearance: 31.9 mL/min (A) (by C-G formula based on SCr of 1.32 mg/dL (H)). Liver Function Tests: No results for input(s): "AST", "ALT", "ALKPHOS", "BILITOT", "PROT", "ALBUMIN" in the last 168 hours. No results for input(s): "LIPASE", "AMYLASE" in the last 168 hours. No results for input(s): "AMMONIA" in the last 168 hours. Coagulation Profile: Recent Labs  Lab 05/22/22 2120 05/23/22 0508  INR 1.8* 1.8*   Cardiac Enzymes: No results for input(s): "CKTOTAL", "CKMB", "  CKMBINDEX", "TROPONINI" in the last 168 hours. BNP (last 3 results) No results for input(s): "PROBNP" in the last 8760 hours. HbA1C: No results for input(s): "HGBA1C" in the last 72 hours. CBG: No results for input(s): "GLUCAP" in the last 168 hours. Lipid Profile: No results for input(s): "CHOL", "HDL", "LDLCALC", "TRIG", "CHOLHDL", "LDLDIRECT" in the last 72 hours. Thyroid Function Tests: No results for input(s): "TSH", "T4TOTAL", "FREET4", "T3FREE", "THYROIDAB" in the last 72 hours. Anemia Panel: No results for input(s): "VITAMINB12", "FOLATE", "FERRITIN", "TIBC", "IRON", "RETICCTPCT" in  the last 72 hours. Sepsis Labs: No results for input(s): "PROCALCITON", "LATICACIDVEN" in the last 168 hours.  No results found for this or any previous visit (from the past 240 hour(s)).       Radiology Studies: DG Chest 1 View  Result Date: 05/23/2022 CLINICAL DATA:  Pleural effusion EXAM: CHEST  1 VIEW COMPARISON:  05/22/2022 FINDINGS: Right sided pacemaker overlies normal cardiac silhouette. Bilateral pleural effusions. The RIGHT pleural effusion is decreased from comparison exam. No pneumothorax. IMPRESSION: 1. No pneumothorax. 2. Decreased RIGHT pleural effusion. Electronically Signed   By: Suzy Bouchard M.D.   On: 05/23/2022 10:37   DG Chest 2 View  Result Date: 05/22/2022 CLINICAL DATA:  Shortness of breath EXAM: CHEST - 2 VIEW COMPARISON:  05/18/2022 FINDINGS: Cardiomegaly status post median sternotomy with left chest multi lead pacer and aortic valve prosthesis. Unchanged moderate bilateral pleural effusions and associated atelectasis or consolidation. Mild disc degenerative disease of the thoracic spine. IMPRESSION: Unchanged moderate bilateral pleural effusions and associated atelectasis or consolidation. Findings are most consistent with edema. No acute appearing airspace opacity. Electronically Signed   By: Delanna Ahmadi M.D.   On: 05/22/2022 17:38        Scheduled Meds:  amiodarone  100 mg Oral Daily   atorvastatin  20 mg Oral Daily   furosemide  40 mg Oral Daily   levothyroxine  50 mcg Oral QAC breakfast   lipase/protease/amylase  36,000 Units Oral TID AC   metoprolol tartrate  25 mg Oral BID   pantoprazole  40 mg Oral Daily   potassium chloride  20 mEq Oral Daily   Continuous Infusions:   LOS: 0 days    Time spent: 45 minutes spent on chart review, discussion with nursing staff, consultants, updating family and interview/physical exam; more than 50% of that time was spent in counseling and/or coordination of care.    Geradine Girt, DO [facility] Available via Epic secure chat 7am-7pm After these hours, please refer to coverage provider listed on [email or web address] 05/23/2022, 10:59 AM

## 2022-05-23 NOTE — ED Notes (Signed)
Pt ambulated without difficulties around the unit, she reported slight shob, O2 sat when back in room was 92% on room air. This RN has lowered O2 from 3L to 2L and pt O2 sat is at 100%

## 2022-05-24 ENCOUNTER — Other Ambulatory Visit (HOSPITAL_COMMUNITY): Payer: Medicare PPO

## 2022-05-24 ENCOUNTER — Observation Stay (HOSPITAL_COMMUNITY): Payer: Medicare PPO

## 2022-05-24 DIAGNOSIS — I4892 Unspecified atrial flutter: Secondary | ICD-10-CM | POA: Diagnosis present

## 2022-05-24 DIAGNOSIS — N1831 Chronic kidney disease, stage 3a: Secondary | ICD-10-CM | POA: Diagnosis present

## 2022-05-24 DIAGNOSIS — K219 Gastro-esophageal reflux disease without esophagitis: Secondary | ICD-10-CM | POA: Diagnosis present

## 2022-05-24 DIAGNOSIS — M810 Age-related osteoporosis without current pathological fracture: Secondary | ICD-10-CM | POA: Diagnosis present

## 2022-05-24 DIAGNOSIS — Z9012 Acquired absence of left breast and nipple: Secondary | ICD-10-CM | POA: Diagnosis not present

## 2022-05-24 DIAGNOSIS — J90 Pleural effusion, not elsewhere classified: Secondary | ICD-10-CM | POA: Diagnosis not present

## 2022-05-24 DIAGNOSIS — D72829 Elevated white blood cell count, unspecified: Secondary | ICD-10-CM | POA: Diagnosis present

## 2022-05-24 DIAGNOSIS — I130 Hypertensive heart and chronic kidney disease with heart failure and stage 1 through stage 4 chronic kidney disease, or unspecified chronic kidney disease: Secondary | ICD-10-CM | POA: Diagnosis present

## 2022-05-24 DIAGNOSIS — Z9221 Personal history of antineoplastic chemotherapy: Secondary | ICD-10-CM | POA: Diagnosis not present

## 2022-05-24 DIAGNOSIS — I471 Supraventricular tachycardia: Secondary | ICD-10-CM | POA: Diagnosis present

## 2022-05-24 DIAGNOSIS — I480 Paroxysmal atrial fibrillation: Secondary | ICD-10-CM | POA: Diagnosis present

## 2022-05-24 DIAGNOSIS — Z681 Body mass index (BMI) 19 or less, adult: Secondary | ICD-10-CM | POA: Diagnosis not present

## 2022-05-24 DIAGNOSIS — I970 Postcardiotomy syndrome: Secondary | ICD-10-CM | POA: Diagnosis present

## 2022-05-24 DIAGNOSIS — R7303 Prediabetes: Secondary | ICD-10-CM | POA: Diagnosis present

## 2022-05-24 DIAGNOSIS — Z953 Presence of xenogenic heart valve: Secondary | ICD-10-CM | POA: Diagnosis not present

## 2022-05-24 DIAGNOSIS — Z923 Personal history of irradiation: Secondary | ICD-10-CM | POA: Diagnosis not present

## 2022-05-24 DIAGNOSIS — J9811 Atelectasis: Secondary | ICD-10-CM | POA: Diagnosis not present

## 2022-05-24 DIAGNOSIS — Z8572 Personal history of non-Hodgkin lymphomas: Secondary | ICD-10-CM | POA: Diagnosis not present

## 2022-05-24 DIAGNOSIS — R636 Underweight: Secondary | ICD-10-CM | POA: Diagnosis present

## 2022-05-24 DIAGNOSIS — J449 Chronic obstructive pulmonary disease, unspecified: Secondary | ICD-10-CM | POA: Diagnosis present

## 2022-05-24 DIAGNOSIS — N179 Acute kidney failure, unspecified: Secondary | ICD-10-CM | POA: Diagnosis not present

## 2022-05-24 DIAGNOSIS — I472 Ventricular tachycardia, unspecified: Secondary | ICD-10-CM | POA: Diagnosis present

## 2022-05-24 DIAGNOSIS — I442 Atrioventricular block, complete: Secondary | ICD-10-CM | POA: Diagnosis present

## 2022-05-24 DIAGNOSIS — Z950 Presence of cardiac pacemaker: Secondary | ICD-10-CM | POA: Diagnosis not present

## 2022-05-24 DIAGNOSIS — R0602 Shortness of breath: Secondary | ICD-10-CM | POA: Diagnosis present

## 2022-05-24 DIAGNOSIS — Z7983 Long term (current) use of bisphosphonates: Secondary | ICD-10-CM | POA: Diagnosis not present

## 2022-05-24 HISTORY — PX: IR THORACENTESIS ASP PLEURAL SPACE W/IMG GUIDE: IMG5380

## 2022-05-24 LAB — PROTIME-INR
INR: 3.4 — ABNORMAL HIGH (ref 0.8–1.2)
Prothrombin Time: 33.9 seconds — ABNORMAL HIGH (ref 11.4–15.2)

## 2022-05-24 LAB — CBC
HCT: 30.5 % — ABNORMAL LOW (ref 36.0–46.0)
Hemoglobin: 9.6 g/dL — ABNORMAL LOW (ref 12.0–15.0)
MCH: 27.3 pg (ref 26.0–34.0)
MCHC: 31.5 g/dL (ref 30.0–36.0)
MCV: 86.6 fL (ref 80.0–100.0)
Platelets: 356 10*3/uL (ref 150–400)
RBC: 3.52 MIL/uL — ABNORMAL LOW (ref 3.87–5.11)
RDW: 16 % — ABNORMAL HIGH (ref 11.5–15.5)
WBC: 10.9 10*3/uL — ABNORMAL HIGH (ref 4.0–10.5)
nRBC: 0.3 % — ABNORMAL HIGH (ref 0.0–0.2)

## 2022-05-24 LAB — BASIC METABOLIC PANEL
Anion gap: 9 (ref 5–15)
BUN: 17 mg/dL (ref 8–23)
CO2: 27 mmol/L (ref 22–32)
Calcium: 8.8 mg/dL — ABNORMAL LOW (ref 8.9–10.3)
Chloride: 103 mmol/L (ref 98–111)
Creatinine, Ser: 1.23 mg/dL — ABNORMAL HIGH (ref 0.44–1.00)
GFR, Estimated: 47 mL/min — ABNORMAL LOW (ref >60)
Glucose, Bld: 115 mg/dL — ABNORMAL HIGH (ref 70–99)
Potassium: 3.6 mmol/L (ref 3.5–5.1)
Sodium: 139 mmol/L (ref 135–145)

## 2022-05-24 MED ORDER — POTASSIUM CHLORIDE 20 MEQ PO PACK
40.0000 meq | PACK | Freq: Every day | ORAL | Status: DC
  Administered 2022-05-25 – 2022-05-26 (×2): 40 meq via ORAL
  Filled 2022-05-24 (×2): qty 2

## 2022-05-24 MED ORDER — FUROSEMIDE 10 MG/ML IJ SOLN: 40.0000 mg | Freq: Two times a day (BID) | INTRAMUSCULAR | Status: DC

## 2022-05-24 MED ORDER — FUROSEMIDE 10 MG/ML IJ SOLN
40.0000 mg | Freq: Two times a day (BID) | INTRAMUSCULAR | Status: DC
  Administered 2022-05-24: 40 mg via INTRAVENOUS
  Filled 2022-05-24: qty 4

## 2022-05-24 MED ORDER — LIDOCAINE HCL 1 % IJ SOLN
INTRAMUSCULAR | Status: AC
  Filled 2022-05-24: qty 20

## 2022-05-24 NOTE — Care Management Obs Status (Signed)
[place] NOTIFICATION   Patient Details  Name: Patricia [name] MRN: [id number] Date of Birth: 1951/06/26   Medicare Observation Status Notification Given:  Yes    Angelita Ingles, RN 05/24/2022, 10:19 AM

## 2022-05-24 NOTE — Progress Notes (Addendum)
[address]       [place] [address]             [phone]       Subjective:  Patient feels like she is breathing a little better.  She thinks the left side needs to be done.  She also states that she received too much coumadin last night.  She states she attempted to tell them her normal regimen but no one listened.  Objective: Vital signs in last 24 hours: Temp:  [97.8 F (36.6 C)-98.9 F (37.2 C)] 98.5 F (36.9 C) (06/13 0416) Pulse Rate:  [56-105] 100 (06/13 0416) Cardiac Rhythm: Ventricular paced (06/13 0416) Resp:  [16-23] 19 (06/13 0416) BP: (72-141)/(49-95) 123/67 (06/13 0416) SpO2:  [83 %-100 %] 94 % (06/13 0416)  Intake/Output from previous day: 06/12 0701 - 06/13 0700 In: 240 [P.O.:240] Out: 1 [Urine:1]  General appearance: alert, cooperative, and no distress Heart: regular rate and rhythm Lungs: diminished breath sounds bibasilar Abdomen: soft, non-tender; bowel sounds normal; no masses,  no organomegaly Extremities: extremities normal, atraumatic, no cyanosis or edema Wound: clean and well healed  Lab Results: Recent Labs    05/23/22 0508 05/24/22 0413  WBC 17.1* 10.9*  HGB 10.6* 9.6*  HCT 33.6* 30.5*  PLT 393 356   BMET:  Recent Labs    05/23/22 0508 05/24/22 0413  NA 142 139  K 3.7 3.6  CL 102 103  CO2 30 27  GLUCOSE 103* 115*  BUN 19 17  CREATININE 1.32* 1.23*  CALCIUM 9.1 8.8*    PT/INR:  Recent Labs    05/24/22 0413  LABPROT 33.9*  INR 3.4*   ABG    Component Value Date/Time   PHART 7.368 04/08/2022 0229   HCO3 24.8 04/08/2022 0229   TCO2 26 04/08/2022 0229   ACIDBASEDEF 2.0 04/07/2022 0947   O2SAT 100 04/08/2022 0229   CBG (last 3)  No results for input(s): "GLUCAP" in the last 72 hours.  Assessment/Plan:  Right Pleural effusion- scheduled for outpatient Thoracentesis, unfortunately she presented to the ED with worsening shortness of breat.. Thoracentesis on right side removed 700 cc of  fluid Left Pleural effusion- slightly improved to me, will get repeat IR evaluation to see if any fluid to remove on left  INR at 3.4, big increase from 1.8, received coumadin 5 mg last night and I suspect INR will be higher tomorrow Dispo- patient stable, care per medicine, repeat thoracentesis evaluation ordered for left side   LOS: 0 days   Ellwood Handler, PA-C  05/24/2022   Chart reviewed, patient examined, agree with above. She feels better after left thoracentesis today although she had a lot of pain with it. IR removed 550 cc with near complete resolution of the left effusion. The is still some right pleural effusion and RLL atelectasis and there was residual effusion after the right thoracentesis. Etiology of the effusions is not clear although she did have bilateral pleural effusions after her prior mini-MVR in 2018 but only required a right thoracentesis removing 800 cc of fluid. Her echo on 05/17/2022 showed an LVEF of 60-65%, RV function ok with moderate pulmonary HTN, moderate TR, Normally functioning MV prosthesis with mean gradient of 4 mm Hg and a normally functioning aortic valve prosthesis with mean gradient of 3 mm Hg. There is no pericardial effusion. This could just be some residual volume excess, lower albumin and malnutrition, postpericardiotomy syndrome or combination of the above. I would keep  on diuretic and if they recur would do a second thoracentesis. If they recur a second time I would insert PleurX catheters. She needs to work on IS to get lungs fully expanded.

## 2022-05-24 NOTE — Plan of Care (Signed)
?  Problem: Education: ?Goal: Knowledge of General Education information will improve ?Description: Including pain rating scale, medication(s)/side effects and non-pharmacologic comfort measures ?Outcome: Progressing ?  ?Problem: Health Behavior/Discharge Planning: ?Goal: Ability to manage health-related needs will improve ?Outcome: Progressing ?  ?Problem: Clinical Measurements: ?Goal: Ability to maintain clinical measurements within normal limits will improve ?Outcome: Progressing ?Goal: Diagnostic test results will improve ?Outcome: Progressing ?  ?Problem: Activity: ?Goal: Risk for activity intolerance will decrease ?Outcome: Progressing ?  ?Problem: Nutrition: ?Goal: Adequate nutrition will be maintained ?Outcome: Progressing ?  ?

## 2022-05-24 NOTE — Progress Notes (Signed)
PROGRESS NOTE    Patricia [name]  ULA:[id number] DOB: 1951/01/19 DOA: 05/22/2022 PCP: Susy Frizzle, MD   Brief Narrative: 62 year with PMH significant for complete heart block s/p pacemaker, history of hodgkin 's lymphoma, history of breast cancer, recent bioprosthetic valve replacement 03/2022 by Dr Cyndia Bent.  She has been following with Dr. Cyndia Bent for bilateral pleural effusion right more than left.  She was supposed to have outpatient thoracentesis on June 12, but she presented to the ED with worsening shortness of breath.  Patient underwent right-sided thoracentesis yielding 700 cc fluids on 6/12.  Subsequent underwent left-sided thoracentesis 6/13 yielding 550 cc fluid. CVTS is following.  Cardiology has been consulted as well.    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 -Fluid was not sent for analysis. -CVTS following. -I have consulted cardiology as well, BNP elevated, recent bioprosthetic aortic valve replacement.  2-Leukocytosis: Trending down. Appears to be chronic.  White blood cell 1 month ago at 15--17.  Recent bioprosthetic aortic valve replacement, prior history of mitral valve replacement Continue with Lasix Cardiology consulted CVTS  is following  Chronic kidney disease stage IIIa: Prior cr 1.2--1.4 Stable.   History of A-fib and known SVT: Continue with metoprolol and amiodarone On Coumadin  History of Hodgkin lymphoma: Status postradiation and chemotherapy and history of breast  cancer.  History of complete heart block status post pacemaker   Estimated body mass index is 18.97 kg/m as calculated from the following:   Height as of 05/18/22: '5\' 5"'$  (1.651 m).   Weight as of 05/18/22: 51.7 kg.   DVT prophylaxis: on coumadin Code Status: full code Family Communication: care discussed with patient Disposition Plan:  Status is: Observation The patient remains OBS appropriate and will d/c before 2 midnights.    Consultants:  Cardiology CVTS  Procedures:  Thoracentesis time 2.   Antimicrobials:    Subjective: She just came from having left side fluid removed.    Objective: Vitals:   05/23/22 2350 05/24/22 0416 05/24/22 0818 05/24/22 0900  BP: (!) 107/57 123/67 132/71 126/72  Pulse: 92 100 (!) 115 (!) 107  Resp: '20 19  18  '$ Temp: 97.8 F (36.6 C) 98.5 F (36.9 C) (!) 97.5 F (36.4 C)   TempSrc: Oral Oral Oral   SpO2: 98% 94%  95%    Intake/Output Summary (Last 24 hours) at 05/24/2022 0950 Last data filed at 05/24/2022 2122 Gross per 24 hour  Intake 420 ml  Output 1 ml  Net 419 ml   There were no vitals filed for this visit.  Examination:  General exam: Appears calm and comfortable  Respiratory system: Clear to auscultation. Respiratory effort normal. Cardiovascular system: S1 & S2 heard, RRR.  Gastrointestinal system: Abdomen is nondistended, soft and nontender. No organomegaly or masses felt. Normal bowel sounds heard. Central nervous system: Alert and oriented.  Extremities: Symmetric 5 x 5 power.   Data Reviewed: I have personally reviewed following labs and imaging studies  CBC: Recent Labs  Lab 05/22/22 1659 05/23/22 0508 05/24/22 0413  WBC 18.6* 17.1* 10.9*  HGB 11.5* 10.6* 9.6*  HCT 37.1 33.6* 30.5*  MCV 87.3 87.3 86.6  PLT 446* 393 202   Basic Metabolic Panel: Recent Labs  Lab 05/22/22 1659 05/23/22 0508 05/24/22 0413  NA 139 142 139  K 4.1 3.7 3.6  CL 98 102 103  CO2 '30 30 27  '$ GLUCOSE 116* 103* 115*  BUN '23  19 17  '$ CREATININE 1.52* 1.32* 1.23*  CALCIUM 9.2 9.1 8.8*   GFR: Estimated Creatinine Clearance: 34.2 mL/min (A) (by C-G formula based on SCr of 1.23 mg/dL (H)). Liver Function Tests: No results for input(s): "AST", "ALT", "ALKPHOS", "BILITOT", "PROT", "ALBUMIN" in the last 168 hours. No results for input(s): "LIPASE", "AMYLASE" in the last 168 hours. No results for input(s): "AMMONIA" in the last 168 hours. Coagulation Profile: Recent Labs  Lab 05/22/22 2120 05/23/22 0508 05/24/22 0413  INR 1.8* 1.8* 3.4*   Cardiac Enzymes: No results for input(s): "CKTOTAL", "CKMB", "CKMBINDEX", "TROPONINI" in the last 168 hours. BNP (last 3 results) No results for input(s): "PROBNP" in the last 8760 hours. HbA1C: No results for input(s): "HGBA1C" in the last 72 hours. CBG: No results for input(s): "GLUCAP" in the last 168 hours. Lipid Profile: No results for input(s): "CHOL", "HDL", "LDLCALC", "TRIG", "CHOLHDL", "LDLDIRECT" in the last 72 hours. Thyroid Function Tests: No results for input(s): "TSH", "T4TOTAL", "FREET4", "T3FREE", "THYROIDAB" in the last 72 hours. Anemia Panel: No results for input(s): "VITAMINB12", "FOLATE", "FERRITIN", "TIBC", "IRON", "RETICCTPCT" in the last 72 hours. Sepsis Labs: No results for input(s): "PROCALCITON", "LATICACIDVEN" in the last 168 hours.  No results found for this or any previous visit (from the past 240 hour(s)).       Radiology Studies: IR THORACENTESIS ASP PLEURAL SPACE W/IMG GUIDE  Result Date: 05/23/2022 INDICATION: Right pleural effusion s/p aortic valve replacement EXAM: ULTRASOUND GUIDED RIGHT THORACENTESIS MEDICATIONS: None. COMPLICATIONS: None immediate. PROCEDURE: An ultrasound guided thoracentesis was thoroughly discussed with the patient and questions answered. The benefits, risks, alternatives and complications were also discussed. The patient understands and wishes to proceed with the procedure. Written consent was obtained.  Ultrasound was performed to localize and mark an adequate pocket of fluid in the right chest. The area was then prepped and draped in the normal sterile fashion. 1% Lidocaine was used for local anesthesia. Under ultrasound guidance a 6 Fr Safe-T-Centesis catheter was introduced. Thoracentesis was performed. Tubing revealed air leak and was exchanged for new tubing and thoracentesis was resumed. Following, the catheter was removed and a dressing applied. FINDINGS: A total of approximately 700cc of amber fluid was removed. IMPRESSION: Successful ultrasound guided right thoracentesis yielding 700cc of pleural fluid. Electronically Signed   By: Ruthann Cancer M.D.   On: 05/23/2022 11:04   DG Chest 1 View  Result Date: 05/23/2022 CLINICAL DATA:  Pleural effusion EXAM: CHEST  1 VIEW COMPARISON:  05/22/2022 FINDINGS: Right sided pacemaker overlies normal cardiac silhouette. Bilateral pleural effusions. The RIGHT pleural effusion is decreased from comparison exam. No pneumothorax. IMPRESSION: 1. No pneumothorax. 2. Decreased RIGHT pleural effusion. Electronically Signed   By: Suzy Bouchard M.D.   On: 05/23/2022 10:37   DG Chest 2 View  Result Date: 05/22/2022 CLINICAL DATA:  Shortness of breath EXAM: CHEST - 2 VIEW COMPARISON:  05/18/2022 FINDINGS: Cardiomegaly status post median sternotomy with left chest multi lead pacer and aortic valve prosthesis. Unchanged moderate bilateral pleural effusions and associated atelectasis or consolidation. Mild disc degenerative disease of the thoracic spine. IMPRESSION: Unchanged moderate bilateral pleural effusions and associated atelectasis or  consolidation. Findings are most consistent with edema. No acute appearing airspace opacity. Electronically Signed   By: Delanna Ahmadi M.D.   On: 05/22/2022 17:38        Scheduled Meds:  amiodarone  100 mg Oral Daily   atorvastatin  20 mg Oral Daily   furosemide  40 mg Oral Daily   levothyroxine  50 mcg Oral QAC breakfast    lipase/protease/amylase  36,000 Units Oral TID AC   metoprolol tartrate  12.5 mg Oral BID   pantoprazole  40 mg Oral Daily   potassium chloride  20 mEq Oral Daily   Warfarin - Pharmacist Dosing Inpatient   Does not apply q1600   Continuous Infusions:   LOS: 0 days    Time spent: 35 minutes.     Elmarie Shiley, MD [facility]   If 7PM-7AM, please contact night-coverage [email or web address]  05/24/2022, 9:50 AM

## 2022-05-24 NOTE — Progress Notes (Signed)
[facility] for Warfarin Indication: atrial fibrillation  Allergies  Allergen Reactions   Aspirin Other (See Comments)    Past bleeding ulcer. GI upset   Azithromycin Other (See Comments)    pancreatitis   Penicillins Anaphylaxis, Hives, Swelling and Other (See Comments)    PATIENT HAS HAD A PCN REACTION WITH IMMEDIATE RASH, FACIAL/TONGUE/THROAT SWELLING, SOB, OR LIGHTHEADEDNESS WITH HYPOTENSION:  #  #  #  YES  #  #  #   HAS PT DEVELOPED SEVERE RASH INVOLVING MUCUS MEMBRANES or SKIN NECROSIS: #  #  #  YES  #  #  #  Has patient had a PCN reaction that required hospitalization: No Has patient had a PCN reaction occurring within the last 10 years: No    Sulfa Antibiotics Hives and Rash   Cheese Nausea And Vomiting    Patient Measurements:    Vital Signs: Temp: 98.5 F (36.9 C) (06/13 0416) Temp Source: Oral (06/13 0416) BP: 123/67 (06/13 0416) Pulse Rate: 100 (06/13 0416)  Labs: Recent Labs    05/22/22 1659 05/22/22 2120 05/23/22 0508 05/24/22 0413  HGB 11.5*  --  10.6* 9.6*  HCT 37.1  --  33.6* 30.5*  PLT 446*  --  393 356  LABPROT  --  21.1* 21.0* 33.9*  INR  --  1.8* 1.8* 3.4*  CREATININE 1.52*  --  1.32* 1.23*  TROPONINIHS 15 14  --   --      Estimated Creatinine Clearance: 34.2 mL/min (A) (by C-G formula based on SCr of 1.23 mg/dL (H)).   Medical History: Past Medical History:  Diagnosis Date   Atrial flutter ([place])    Ablated 1998 Dr. Caryl Comes   Basal cell carcinoma 06/05/2019   R low back, [facility] 07/16/19   Basal cell carcinoma 04/15/2010   Right post shoulder   Basal cell carcinoma 04/15/2010   Mid back   Basal cell carcinoma (BCC) 09/29/2016   Left post shoulder. Superficial and nodular.   BCC (basal cell carcinoma) 07/21/2021   right mid back, [facility] 09/15/2021   BCC (basal cell carcinoma) 01/27/2022   left lower abdomen   Bleeding gastric ulcer    back in 2000   Breast cancer ([place])    COPD (chronic obstructive  pulmonary disease) ([place])    from radiation   GERD (gastroesophageal reflux disease)    History of basal cell carcinoma (BCC) 03/22/2021   right neck and right medial ear, Moh's   Hodgkin's lymphoma ([place])    Treated with radiation and chemo   Mitral regurgitation    pvc   Osteoporosis    lumbar verterbral fracture, left ankle fracture, dexa 2015   Pancreatitis    Pneumonia    Prediabetes    Presence of permanent cardiac pacemaker    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 F presents with R-sided pleural effusion - s/p thoracentesis 6/12. Pt on warfarin PTA for afib. Admission INR 1.8. CBC ok on admission. Last dose 6/10 1830. Home dose: 2.'5mg'$  M/W/F and '5mg'$  T/T/S/S.  INR today up to 3.4; Hgb slight trend down 11.5 > 9.6; Pltc ok  Goal of Therapy:  INR 2-3 Monitor platelets by anticoagulation protocol: Yes   Plan:  Hold warfarin today for elevated INR. Daily INR.  [place] Crane, Roylene Reason, BCCP Clinical Pharmacist  05/24/2022 7:49 AM   [facility] pharmacy phone numbers are listed on [email or web address]

## 2022-05-24 NOTE — Progress Notes (Signed)
Mobility Specialist Progress Note    05/24/22 1704  Mobility  Activity Ambulated independently in hallway  Level of Assistance Independent  Assistive Device None  Distance Ambulated (ft) 420 ft  Activity Response Tolerated well  $Mobility charge 1 Mobility   Pre-Mobility: 122 HR During Mobility: 92% SpO2 Post-Mobility: 127 HR  Pt received sitting EOB and agreeable. No complaints on walk. Returned to BR to void.   Hildred Alamin Mobility Specialist

## 2022-05-24 NOTE — Plan of Care (Signed)
  Problem: Education: Goal: Knowledge of General Education information will improve Description: Including pain rating scale, medication(s)/side effects and non-pharmacologic comfort measures Outcome: Progressing   Problem: Health Behavior/Discharge Planning: Goal: Ability to manage health-related needs will improve Outcome: Progressing   Problem: Clinical Measurements: Goal: Ability to maintain clinical measurements within normal limits will improve Outcome: Progressing Goal: Will remain free from infection Outcome: Progressing Goal: Diagnostic test results will improve Outcome: Progressing   Problem: Nutrition: Goal: Adequate nutrition will be maintained Outcome: Progressing   Problem: Pain Managment: Goal: General experience of comfort will improve Outcome: Progressing   Problem: Safety: Goal: Ability to remain free from injury will improve Outcome: Progressing   Problem: Skin Integrity: Goal: Risk for impaired skin integrity will decrease Outcome: Progressing

## 2022-05-24 NOTE — Procedures (Signed)
PROCEDURE SUMMARY:  Successful US guided left thoracentesis. Yielded 510m of amber fluid. Pt tolerated procedure well. No immediate complications.  Specimen not sent for labs. CXR ordered.  EBL < 5 mL  Maleny Candy PA-C 05/24/2022 10:16 AM

## 2022-05-24 NOTE — Consult Note (Addendum)
Cardiology Consultation:   Patient ID: Patricia [name] MRN: [id number]; DOB: February 23, 1951  Admit date: 05/22/2022 Date of Consult: 05/24/2022  PCP:  Susy Frizzle, MD   [facility] Providers Cardiologist:  Minus Breeding, MD  Electrophysiologist:  Cristopher Peru, MD       Patient Profile:   Patricia [name] is a 71 y.o. female with a hx of AVR w/ Inspiris bioprosthetic valve 03/2022 w/ PAF/AT >> amio, bioprosthetic MVR 2018 on coumadin, MDT PPM 2018 for CHB, Atrial flutter s/p ablation, pre-DM, COPD, Breast CA, Basal cell CA, Hodgkin's lymphoma, who is being seen 05/24/2022 for the evaluation of pleural effusions after bioprosthetic valve placement, at the request of Dr Tyrell Antonio.  History of Present Illness:   Ms. Trickey was discharged after her AVR on 04/14/2022. Her HR was 99-116 on amio. Coumadin was loading. Lasix changed to torsemide, wt 125 lbs.   EP follow up 05/22, wt 119, AT/AF burden 0.5%, amio decreased to 100 mg qd since pt prefers not to be on it. F/u GT 6-8 weeks.  She was admitted 06/11 with increasing SOB, Dr Cyndia Bent did CXR and pt had bilat pleural effusions. She had T-centesis 06/12 on R w/ 700 cc out and then on 06/13 with 550 cc out.   Cards asked to see.   Ms. Sangiovanni was following her weight closely when she went home from the hospital.  She went home on both Lasix and torsemide.  The torsemide ran out and she kept taking the Lasix.  She would take an extra Lasix tablet if her weight was up a couple of pounds that day.  She saw Dr. Caffie Pinto and he did a chest x-ray, showing the pleural effusions.  She had developed dyspnea on exertion, and it was worsening.  She describes orthopnea and PND.  On the day of admission, she could barely walk.  Although she was scheduled for a thoracentesis Monday morning, she could not wait that long and came in on the 11th.  She feels 200% better since she got the effusions drained.  She is not back to baseline as far as her  breathing goes, but she is much improved.   Past Medical History:  Diagnosis Date   Atrial flutter ([place])    Ablated 1998 Dr. Caryl Comes   Basal cell carcinoma 06/05/2019   R low back, EDC 07/16/19   Basal cell carcinoma 04/15/2010   Right post shoulder   Basal cell carcinoma 04/15/2010   Mid back   Basal cell carcinoma (BCC) 09/29/2016   Left post shoulder. Superficial and nodular.   BCC (basal cell carcinoma) 07/21/2021   right mid back,  [facility] 09/15/2021   BCC (basal cell carcinoma) 01/27/2022   left lower abdomen   Bleeding gastric ulcer    back in 2000   Breast cancer ([place])    COPD (chronic obstructive pulmonary disease) ([place])    from radiation   GERD (gastroesophageal reflux disease)    History of basal cell carcinoma (BCC) 03/22/2021   right neck and right medial ear, Moh's   Hodgkin's lymphoma ([place])    Treated with radiation and chemo   Mitral regurgitation    pvc   Osteoporosis    lumbar verterbral fracture, left ankle fracture, dexa 2015   Pancreatitis    Pneumonia    Prediabetes    Presence of permanent cardiac pacemaker    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 09/13/2017   25 mm Medtronic Mosaic porcine stented bioprosthetic tissue valve  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   Left medial lower leg   Ulcer     Past Surgical History:  Procedure Laterality Date   AORTIC VALVE REPLACEMENT N/A 04/07/2022   Procedure: AORTIC VALVE REPLACEMENT (AVR), USING INSPIRIS 19 MM AORTIC VALVE;  Surgeon: [name], MD;  Location: [place];  Service: Open Heart Surgery;  Laterality: N/A;   BREAST SURGERY     breast cancer since 2005   left mastectomy   BUBBLE STUDY  01/12/2022   Procedure: BUBBLE STUDY;  Surgeon: Geralynn Rile, MD;  Location: [place];  Service: Cardiovascular;;   CARDIAC ELECTROPHYSIOLOGY [place] REPLACEMENT N/A    Phreesia 03/19/2021   CATARACT EXTRACTION, BILATERAL      COLONOSCOPY     IR RADIOLOGY PERIPHERAL GUIDED IV START  08/03/2017   IR THORACENTESIS ASP PLEURAL SPACE W/IMG GUIDE  05/23/2022   IR US GUIDE VASC ACCESS RIGHT  08/03/2017   LAPAROTOMY     MASTECTOMY  2005   Left-axillary   MITRAL VALVE REPAIR Right 09/13/2017   Procedure: MINIMALLY INVASIVE MITRAL VALVE REPLACEMENT (MVR) with Medtronic Mosaic Porcine Heart Valve size 68mm;  Surgeon: Rexene Alberts, MD;  Location: [place];  Service: Open Heart Surgery;  Laterality: Right;   ORIF ANKLE FRACTURE Left 03/21/2014   Procedure: LEFT ANKLE FRACTURE OPEN TREATMENT BILMALLEOLAR INCLUDES INTERNAL FIXATION ;  Surgeon: Renette Butters, MD;  Location: [place];  Service: Orthopedics;  Laterality: Left;   PACEMAKER IMPLANT N/A 09/19/2017   Medtronic Azure XT MRI conditional dual-chamber pacemaker for symptomatic complete heart block  by Dr Rayann Heman   RIGHT/LEFT HEART CATH AND CORONARY ANGIOGRAPHY N/A 06/27/2017   Procedure: Right/Left Heart Cath and Coronary Angiography;  Surgeon: Larey [name], MD;  Location: [place] [facility];  Service: Cardiovascular;  Laterality: N/A;   SPLENECTOMY  1974   hodgkins disease   TEE WITHOUT CARDIOVERSION  06/12/2012   Procedure: TRANSESOPHAGEAL ECHOCARDIOGRAM (TEE);  Surgeon: Larey [name], MD;  Location: [place];  Service: Cardiovascular;  Laterality: N/A;   TEE WITHOUT CARDIOVERSION N/A 06/29/2017   Procedure: TRANSESOPHAGEAL ECHOCARDIOGRAM (TEE);  Surgeon: Jerline Pain, MD;  Location: [facility];  Service: Cardiovascular;  Laterality: N/A;   TEE WITHOUT CARDIOVERSION N/A 09/13/2017   Procedure: TRANSESOPHAGEAL ECHOCARDIOGRAM (TEE);  Surgeon: Rexene Alberts, MD;  Location: [place];  Service: Open Heart Surgery;  Laterality: N/A;   TEE WITHOUT CARDIOVERSION N/A 01/12/2022   Procedure: TRANSESOPHAGEAL ECHOCARDIOGRAM (TEE);  Surgeon: Geralynn Rile, MD;  Location: [place];  Service: Cardiovascular;  Laterality: N/A;   TEE WITHOUT  CARDIOVERSION N/A 04/07/2022   Procedure: TRANSESOPHAGEAL ECHOCARDIOGRAM (TEE);  Surgeon: [name], MD;  Location: [place];  Service: Open Heart Surgery;  Laterality: N/A;   TONSILLECTOMY AND ADENOIDECTOMY       Home Medications:  Prior to Admission medications   Medication Sig Start Date End Date Taking? Authorizing Provider  acetaminophen (TYLENOL) 325 MG tablet Take 2 tablets (650 mg total) by mouth 2 (two) times daily as needed for moderate pain or headache. 09/21/17  Yes Lars Pinks M, PA-C  alendronate (FOSAMAX) 70 MG tablet Take 1 tablet (70 mg total) by mouth once a week. Patient taking differently: Take 70 mg by mouth once a week. Sunday 12/23/21  Yes Susy Frizzle, MD  amiodarone (PACERONE) 200 MG tablet Take 0.5 tablets (100 mg total) by mouth daily. 05/02/22  Yes Shirley Friar, PA-C  atorvastatin (LIPITOR) 20 MG tablet Take 1 tablet (20 mg total) by mouth daily. 04/14/22  Yes Barrett, Erin R, PA-C  Blood Glucose Monitoring Suppl (ACCU-CHEK GUIDE) w/Device KIT 1 Units by Does not apply route daily. 12/25/19  Yes Susy Frizzle, MD  Calcium Carbonate (CALCIUM 600 PO) Take 600 mg by mouth 2 (two) times daily. Lunch and Pensions consultant, Historical, MD  CREON 36000-114000 units CPEP capsule Take 36,000 Units by mouth 3 (three) times daily before meals. 12/23/21  Yes [provider]  esomeprazole (NEXIUM) 40 MG capsule Take 40 mg by mouth daily before breakfast.  07/02/15  Yes [provider]  fluticasone (FLONASE) 50 MCG/ACT nasal spray Place 2 sprays into both nostrils daily. Patient taking differently: Place 2 sprays into both nostrils daily as needed for rhinitis. 10/14/21  Yes Susy Frizzle, MD  furosemide (LASIX) 40 MG tablet Take 40 mg by mouth daily. Can Take Additional Tablet for Weight Gain 3 pound in a Day and 5 pounds in a Week.   Yes [provider]  glucose blood (ACCU-CHEK GUIDE) test strip USE TO TEST BLOOD SUGARS  EVERY MORNING 01/22/21  Yes Susy Frizzle, MD  Lancets (ACCU-CHEK SOFT TOUCH) lancets Check BS QAM 07/08/20  Yes Susy Frizzle, MD  Lancets Misc. (ACCU-CHEK SOFTCLIX LANCET DEV) KIT Check BS QAM 12/25/19  Yes Susy Frizzle, MD  levothyroxine (SYNTHROID) 50 MCG tablet TAKE 1 TABLET BY MOUTH ONCE A DAY Patient taking differently: Take 50 mcg by mouth daily before breakfast. 03/17/22  Yes Susy Frizzle, MD  metoprolol tartrate (LOPRESSOR) 25 MG tablet Take 1 tablet (25 mg total) by mouth 2 (two) times daily. 04/14/22  Yes Barrett, Erin R, PA-C  Multiple Vitamin (MULTIVITAMIN WITH MINERALS) TABS tablet Take 1 tablet by mouth daily with lunch. Centrum Silver.   Yes [provider]  potassium chloride (KLOR-CON) 20 MEQ packet Take 20 mEq by mouth once for 1 dose. Patient taking differently: Take 20 mEq by mouth daily. 04/26/22 05/22/22 Yes Warren Lacy, PA-C  traMADol (ULTRAM) 50 MG tablet Take 1 tablet (50 mg total) by mouth every 6 (six) hours as needed for moderate pain. 04/14/22  Yes Barrett, Erin R, PA-C  warfarin (COUMADIN) 5 MG tablet Take 1 tablet (5 mg total) by mouth daily. Patient taking differently: Take 2.5-5 mg by mouth See admin instructions. 5 mg on Tuesday,Thursday,Saturday,Sunday 2.5 mg on Monday,Wednesday,friday 04/14/22 04/14/23 Yes Barrett, Lodema Hong, PA-C    Inpatient Medications: Scheduled Meds:  amiodarone  100 mg Oral Daily   atorvastatin  20 mg Oral Daily   furosemide  40 mg Oral Daily   levothyroxine  50 mcg Oral QAC breakfast   lidocaine       lipase/protease/amylase  36,000 Units Oral TID AC   metoprolol tartrate  12.5 mg Oral BID   pantoprazole  40 mg Oral Daily   potassium chloride  20 mEq Oral Daily   Warfarin - Pharmacist Dosing Inpatient   Does not apply q1600   Continuous Infusions:  PRN Meds: lidocaine (PF), lidocaine  Allergies:    Allergies  Allergen Reactions   Aspirin Other (See Comments)    Past bleeding ulcer. GI upset    Azithromycin Other (See Comments)    pancreatitis   Penicillins Anaphylaxis, Hives, Swelling and Other (See Comments)    PATIENT HAS HAD A PCN REACTION WITH IMMEDIATE RASH, FACIAL/TONGUE/THROAT SWELLING, SOB, OR LIGHTHEADEDNESS WITH HYPOTENSION:  #  #  #  YES  #  #  #  HAS PT DEVELOPED SEVERE RASH INVOLVING MUCUS MEMBRANES or SKIN NECROSIS: #  #  #  YES  #  #  #  Has patient had a PCN reaction that required hospitalization: No Has patient had a PCN reaction occurring within the last 10 years: No    Sulfa Antibiotics Hives and Rash   Cheese Nausea And Vomiting    Social History:   Social History   Socioeconomic History   Marital status: Widowed    Spouse name: Not on file   Number of children: Not on file   Years of education: Not on file   Highest education level: Master's degree (e.g., MA, MS, MEng, MEd, MSW, MBA)  Occupational History   Occupation: Pharmacist, hospital (retired)  Tobacco Use   Smoking status: Never   Smokeless tobacco: Never  Vaping Use   Vaping Use: Never used  Substance and Sexual Activity   Alcohol use: No   Drug use: No   Sexual activity: Yes  Other Topics Concern   Not on file  Social History Narrative   Husband passed away from [place] in 08-22-17.   Social Determinants of Health   Financial Resource Strain: Low Risk  (03/22/2021)   Overall Financial Resource Strain (CARDIA)    Difficulty of Paying Living Expenses: Not hard at all  Food Insecurity: No Food Insecurity (03/22/2021)   Hunger Vital Sign    Worried About Running Out of Food in the Last Year: Never true    Ran Out of Food in the Last Year: Never true  Transportation Needs: No Transportation Needs (03/22/2021)   PRAPARE - Hydrologist (Medical): No    Lack of Transportation (Non-Medical): No  Physical Activity: Sufficiently Active (03/22/2021)   Exercise Vital Sign    Days of Exercise per Week: 7 days    Minutes of Exercise per Session: 30 min  Stress: No Stress Concern  Present (03/22/2021)   [place]    Feeling of Stress : Not at all  Social Connections: Moderately Integrated (03/22/2021)   Social Connection and Isolation Panel [NHANES]    Frequency of Communication with Friends and Family: More than three times a week    Frequency of Social Gatherings with Friends and Family: Three times a week    Attends Religious Services: More than 4 times per year    Active Member of Clubs or Organizations: Yes    Attends Archivist Meetings: More than 4 times per year    Marital Status: Widowed  Intimate Partner Violence: Not At Risk (03/22/2021)   Humiliation, Afraid, Rape, and Kick questionnaire    Fear of Current or Ex-Partner: No    Emotionally Abused: No    Physically Abused: No    Sexually Abused: No    Family History:   Family History  Problem Relation Age of Onset   Rheumatic fever Father        Died suddenly age 80   Diabetes Father    Cancer Father        colon   Heart disease Father        rheumatic fever x 3   Arthritis Maternal Grandmother    Hypertension Maternal Grandmother    Diabetes Paternal Grandmother    Vision loss Paternal Grandmother        glaucoma     ROS:  Please see the history of present illness.  All other ROS reviewed and negative.  Physical Exam/Data:   Vitals:   05/23/22 2350 05/24/22 0416 05/24/22 0818 05/24/22 0900  BP: (!) 107/57 123/67 132/71 126/72  Pulse: 92 100 (!) 115 (!) 107  Resp: $Remo'20 19  18  'OGMqC$ Temp: 97.8 F (36.6 C) 98.5 F (36.9 C) (!) 97.5 F (36.4 C)   TempSrc: Oral Oral Oral   SpO2: 98% 94%  95%    Intake/Output Summary (Last 24 hours) at 05/24/2022 1031 Last data filed at 05/24/2022 8250 Gross per 24 hour  Intake 420 ml  Output 1 ml  Net 419 ml      05/18/2022    3:05 PM 05/12/2022   12:15 PM 05/12/2022   11:27 AM  Last 3 Weights  Weight (lbs) 114 lb 114 lb 118 lb 3.2 oz  Weight (kg) 51.71 kg 51.71 kg 53.615 kg      There is no height or weight on file to calculate BMI.  General:  Well nourished, well developed, mild shortness of breath at rest and on O2. HEENT: normal Neck: JVD to jaw Vascular: No carotid bruits; Distal pulses 2+ bilaterally Cardiac:  normal S1, S2; RRR; no murmur heard but difficult to evaluate due to respiratory noise Lungs: Bibasilar Rales, worse on the right, no wheezing, rhonchi  Abd: soft, nontender, no hepatomegaly  Ext: no edema Musculoskeletal:  No deformities, BUE and BLE strength normal and equal Skin: warm and dry  Neuro:  CNs 2-12 intact, no focal abnormalities noted Psych:  Normal affect   EKG:  The EKG was personally reviewed and demonstrates:  06/11 ECG is ST, V pacing w/ HR 102 Telemetry:  Telemetry was personally reviewed and demonstrates:  SR, ST w/ V pacing  Relevant CV Studies:  ECHO: 05/17/2022  1. Left ventricular ejection fraction, by estimation, is 60 to 65%. The  left ventricle has normal function. The left ventricle demonstrates  regional wall motion abnormalities (abnormal septal motion and septal  bounce). Left ventricular diastolic function could not be evaluated.   2. Right ventricular systolic function was not well visualized. The right  ventricular size is normal. There is moderately elevated pulmonary artery  systolic pressure. The estimated right ventricular systolic pressure is  53.9 mmHg.   3. Left atrial size was mildly dilated.   4. The aortic valve has been replaced by a 19 mm Inspiris Resilia Valve.  Aortic valve regurgitation is not present. Suboptimal LVOT spectral  Doppler. Aortic valve mean gradient measures 3.0 mmHg. Aortic valve  acceleration time measures 84 msec. Echo findings are consistent with normal structure and function of the aortic valve prosthesis.   5. The mitral valve has been replaced by a 25 mm Mosaic Bioprosthetic  valve. PHT 81 ms. Suboptimal LVOT spectral Doppler. No evidence of mitral valve regurgitation.  No evidence of mitral stenosis. Mean gradient 4 mm Hg.   6. 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 Jet area takes up greater than  60% of RVOT. PHT 150 ms.   7. Tricuspid valve regurgitation is moderate. There are multiple jets. No  heptic flow assessment.   8. The inferior vena cava is normal in size with <50% respiratory  variability, suggesting right atrial pressure of 8 mmHg.   Comparison(s): Compared to prior, s/p aortic valve replacement. Increase  in pulmonic insufficiency. Increase in tricuspid regurgitation. Stable  mitral valve prosthetic function.   Laboratory Data:  High Sensitivity Troponin:   Recent Labs  Lab 05/22/22 1659 05/22/22 2120  TROPONINIHS 15 14     Chemistry Recent [employer]  05/22/22 1659 05/23/22 0508 05/24/22 0413  NA 139 142 139  K 4.1 3.7 3.6  CL 98 102 103  CO2 $Re'30 30 27  'wJi$ GLUCOSE 116* 103* 115*  BUN $Re'23 19 17  'dAS$ CREATININE 1.52* 1.32* 1.23*  CALCIUM 9.2 9.1 8.8*  GFRNONAA 36* 43* 47*  ANIONGAP $RemoveB'11 10 9    'sTcqioBG$ Lab Results  Component Value Date   ALT 25 04/08/2022   AST 33 04/08/2022   ALKPHOS 40 04/08/2022   BILITOT 1.7 (H) 04/08/2022    Lipids No results for input(s): "CHOL", "TRIG", "HDL", "LABVLDL", "LDLCALC", "CHOLHDL" in the last 168 hours.  Hematology Recent Labs  Lab 05/22/22 1659 05/23/22 0508 05/24/22 0413  WBC 18.6* 17.1* 10.9*  RBC 4.25 3.85* 3.52*  HGB 11.5* 10.6* 9.6*  HCT 37.1 33.6* 30.5*  MCV 87.3 87.3 86.6  MCH 27.1 27.5 27.3  MCHC 31.0 31.5 31.5  RDW 16.3* 16.1* 16.0*  PLT 446* 393 356   Thyroid No results for input(s): "TSH", "FREET4" in the last 168 hours.  BNP Recent Labs  Lab 05/22/22 1659  BNP 494.6*    DDimer No results for input(s): "DDIMER" in the last 168 hours.  Lab Results  Component Value Date   HGBA1C 6.8 (H) 04/05/2022   Lab Results  Component Value Date   INR 3.4 (H) 05/24/2022   INR 1.8 (H) 05/23/2022   INR 1.8 (H) 05/22/2022     Media Information,  CXR 05/24/2022     Media  Information CXR 05/23/2022     Radiology/Studies:  DG Chest 1 View  Result Date: 05/24/2022 CLINICAL DATA:  [id number] status post left thoracentesis EXAM: CHEST  1 VIEW COMPARISON:  Chest x-ray from yesterday FINDINGS: Again seen are sternotomy wires, mediastinal surgical clips and mitral valve prosthesis. Left subclavian dual lead pacer is stable. There has been interval left thoracentesis. Mild residual pleural effusion. No pneumothorax. Moderate right pleural effusion. There is some atelectasis at the right mid-lower lung zone. The visualized skeletal structures are unremarkable. IMPRESSION: There has been interval left thoracentesis. No pneumothorax. Otherwise, there has been no significant interval change. Electronically Signed   By: Frazier Richards M.D.   On: 05/24/2022 10:29   IR THORACENTESIS ASP PLEURAL SPACE W/IMG GUIDE  Result Date: 05/23/2022 INDICATION: Right pleural effusion s/p aortic valve replacement EXAM: ULTRASOUND GUIDED RIGHT THORACENTESIS MEDICATIONS: None. COMPLICATIONS: None immediate. PROCEDURE: An ultrasound guided thoracentesis was thoroughly discussed with the patient and questions answered. The benefits, risks, alternatives and complications were also discussed. The patient understands and wishes to proceed with the procedure. Written consent was obtained. Ultrasound was performed to localize and mark an adequate pocket of fluid in the right chest. The area was then prepped and draped in the normal sterile fashion. 1% Lidocaine was used for local anesthesia. Under ultrasound guidance a 6 Fr Safe-T-Centesis catheter was introduced. Thoracentesis was performed. Tubing revealed air leak and was exchanged for new tubing and thoracentesis was resumed. Following, the catheter was removed and a dressing applied. FINDINGS: A total of approximately 700cc of amber fluid was removed. IMPRESSION: Successful ultrasound guided right thoracentesis yielding 700cc of pleural fluid. Electronically  Signed   By: Ruthann Cancer M.D.   On: 05/23/2022 11:04   DG Chest 1 View  Result Date: 05/23/2022 CLINICAL DATA:  Pleural effusion EXAM: CHEST  1 VIEW COMPARISON:  05/22/2022 FINDINGS: Right sided pacemaker overlies normal cardiac silhouette. Bilateral pleural effusions. The RIGHT pleural effusion is decreased from comparison exam. No pneumothorax. IMPRESSION: 1. No pneumothorax. 2.  Decreased RIGHT pleural effusion. Electronically Signed   By: Suzy Bouchard M.D.   On: 05/23/2022 10:37   DG Chest 2 View  Result Date: 05/22/2022 CLINICAL DATA:  Shortness of breath EXAM: CHEST - 2 VIEW COMPARISON:  05/18/2022 FINDINGS: Cardiomegaly status post median sternotomy with left chest multi lead pacer and aortic valve prosthesis. Unchanged moderate bilateral pleural effusions and associated atelectasis or consolidation. Mild disc degenerative disease of the thoracic spine. IMPRESSION: Unchanged moderate bilateral pleural effusions and associated atelectasis or consolidation. Findings are most consistent with edema. No acute appearing airspace opacity. Electronically Signed   By: Delanna Ahmadi M.D.   On: 05/22/2022 17:38     Assessment and Plan:   Pleural effusions - They were bilateral on arrival. -The right one was drained of 700 cc yesterday - The left one was drained of 550 cc today - On the chest x-ray done today after the left thoracentesis, the right effusion seems to be reaccumulating, MD to review, photos above -She is on Lasix 40 mg p.o. daily, her home dose -Discuss with MD if we should change her to IV Lasix  2. S/p bioprosthetic aortic valve replacement -On echo this admission, the valve is in good position with a gradient of 3 and no regurgitation - Management per TCTS   Risk Assessment/Risk Scores:     New York Heart Association (NYHA) Functional Class NYHA Class IV  For questions or updates, please contact [facility] Please consult [email or web address] for contact info under     Signed, Rosaria Ferries, PA-C  05/24/2022 10:31 AM  Personally seen and examined. Agree with APP above with the following comments:  Briefly 71 yo F with a history of an AVR w Inspiris bioprosthetic valve (9/51 complicated by PAF, on amio and hx of AFL ablation in past) prior bioprosthetic MVR, Prior breast and Hodgkin's Lymphoma with radiation exposure seen for SOB and bilateral pleural effusions  Patient notes that she feels much better after thoracentesis Was doing well after AVR but had residual leg swelling.  She was unable to wear shoes and could only wear flip flops.  After she rans our of torsemide she switched to just lasix.  Had worsening orthopnea and PND.  Found to have new bilateral pleural effusions.  Admitted for SOB prior to her thoracentesis. Feels better and close to baseline presently  Exam notable for regular tachycardia, trace edema left leg She has an RV heave Elevated JVD at 30 degrees Otherwise as above  Tele: AS V paced rates 130 presently Personally reviewed relevant tests; Echo showed stable Aortic and mitral functions with difficult LVOT Spectral Doppler and in complete TDI; significant PI and TR CCTA shows PA Dilation but no calcification of the LV myocardium  Would recommend  - Repeat CXR for 05/25/22 - will transition to IV diuretics - when euvolemic will either repeat limited echo to eval for constriction and valve disease severity or consider RHC; unclear if her valve disease is related to heart decompensation  Rudean Haskell, MD Cardiologist Hypertrophic [place]  [address], [address] [place], [place] [address] [phone]  2:29 PM

## 2022-05-25 ENCOUNTER — Encounter: Payer: Self-pay | Admitting: *Deleted

## 2022-05-25 ENCOUNTER — Ambulatory Visit: Payer: Self-pay | Admitting: Surgery

## 2022-05-25 ENCOUNTER — Inpatient Hospital Stay (HOSPITAL_COMMUNITY): Payer: Medicare PPO

## 2022-05-25 DIAGNOSIS — J90 Pleural effusion, not elsewhere classified: Secondary | ICD-10-CM | POA: Diagnosis not present

## 2022-05-25 DIAGNOSIS — Z952 Presence of prosthetic heart valve: Secondary | ICD-10-CM

## 2022-05-25 LAB — CBC
HCT: 32.2 % — ABNORMAL LOW (ref 36.0–46.0)
Hemoglobin: 10.3 g/dL — ABNORMAL LOW (ref 12.0–15.0)
MCH: 27.5 pg (ref 26.0–34.0)
MCHC: 32 g/dL (ref 30.0–36.0)
MCV: 85.9 fL (ref 80.0–100.0)
Platelets: 393 10*3/uL (ref 150–400)
RBC: 3.75 MIL/uL — ABNORMAL LOW (ref 3.87–5.11)
RDW: 16.1 % — ABNORMAL HIGH (ref 11.5–15.5)
WBC: 10.3 10*3/uL (ref 4.0–10.5)
nRBC: 0.3 % — ABNORMAL HIGH (ref 0.0–0.2)

## 2022-05-25 LAB — PROTIME-INR
INR: 2.5 — ABNORMAL HIGH (ref 0.8–1.2)
Prothrombin Time: 27.1 seconds — ABNORMAL HIGH (ref 11.4–15.2)

## 2022-05-25 LAB — BASIC METABOLIC PANEL
Anion gap: 8 (ref 5–15)
BUN: 17 mg/dL (ref 8–23)
CO2: 31 mmol/L (ref 22–32)
Calcium: 8.5 mg/dL — ABNORMAL LOW (ref 8.9–10.3)
Chloride: 101 mmol/L (ref 98–111)
Creatinine, Ser: 1.39 mg/dL — ABNORMAL HIGH (ref 0.44–1.00)
GFR, Estimated: 41 mL/min — ABNORMAL LOW (ref >60)
Glucose, Bld: 113 mg/dL — ABNORMAL HIGH (ref 70–99)
Potassium: 3.7 mmol/L (ref 3.5–5.1)
Sodium: 140 mmol/L (ref 135–145)

## 2022-05-25 MED ORDER — METOPROLOL TARTRATE 25 MG PO TABS
25.0000 mg | ORAL_TABLET | Freq: Two times a day (BID) | ORAL | Status: DC
  Administered 2022-05-25 (×2): 25 mg via ORAL
  Filled 2022-05-25 (×2): qty 1

## 2022-05-25 MED ORDER — WARFARIN SODIUM 2.5 MG PO TABS
2.5000 mg | ORAL_TABLET | ORAL | Status: DC
  Administered 2022-05-25 – 2022-05-27 (×2): 2.5 mg via ORAL
  Filled 2022-05-25 (×2): qty 1

## 2022-05-25 MED ORDER — WARFARIN - PHYSICIAN DOSING INPATIENT: Freq: Every day | Status: DC

## 2022-05-25 MED ORDER — FUROSEMIDE 10 MG/ML IJ SOLN
60.0000 mg | Freq: Two times a day (BID) | INTRAMUSCULAR | Status: DC
  Administered 2022-05-25: 60 mg via INTRAVENOUS
  Filled 2022-05-25: qty 6

## 2022-05-25 MED ORDER — FUROSEMIDE 10 MG/ML IJ SOLN
60.0000 mg | Freq: Once | INTRAMUSCULAR | Status: AC
Start: 2022-05-25 — End: 2022-05-25
  Administered 2022-05-25: 60 mg via INTRAVENOUS
  Filled 2022-05-25: qty 6

## 2022-05-25 MED ORDER — WARFARIN SODIUM 5 MG PO TABS
5.0000 mg | ORAL_TABLET | ORAL | Status: DC
  Administered 2022-05-26: 5 mg via ORAL
  Filled 2022-05-25: qty 1

## 2022-05-25 NOTE — Progress Notes (Addendum)
      [address]       [place] [address]             [phone]       Subjective:  Patient doing well.  She is breathing much better, off oxygen.  Objective: Vital signs in last 24 hours: Temp:  [97.5 F (36.4 C)-98.7 F (37.1 C)] 98.2 F (36.8 C) (06/14 0445) Pulse Rate:  [99-118] 100 (06/14 0445) Cardiac Rhythm: Ventricular paced (06/14 0445) Resp:  [18-22] 20 (06/14 0445) BP: (109-135)/(60-72) 135/69 (06/14 0445) SpO2:  [92 %-98 %] 98 % (06/14 0445) Weight:  [49.4 kg] 49.4 kg (06/14 0500)  Intake/Output from previous day: 06/13 0701 - 06/14 0700 In: 180 [P.O.:180] Out: -   General appearance: alert, cooperative, and no distress Heart: regular rate and rhythm Lungs: clear to auscultation bilaterally Abdomen: soft, non-tender; bowel sounds normal; no masses,  no organomegaly Extremities: edema none appreciated Wound: clean and dry  Lab Results: Recent Labs    05/24/22 0413 05/25/22 0238  WBC 10.9* 10.3  HGB 9.6* 10.3*  HCT 30.5* 32.2*  PLT 356 393   BMET:  Recent Labs    05/24/22 0413 05/25/22 0238  NA 139 140  K 3.6 3.7  CL 103 101  CO2 27 31  GLUCOSE 115* 113*  BUN 17 17  CREATININE 1.23* 1.39*  CALCIUM 8.8* 8.5*    PT/INR:  Recent Labs    05/25/22 0238  LABPROT 27.1*  INR 2.5*   ABG    Component Value Date/Time   PHART 7.368 04/08/2022 0229   HCO3 24.8 04/08/2022 0229   TCO2 26 04/08/2022 0229   ACIDBASEDEF 2.0 04/07/2022 0947   O2SAT 100 04/08/2022 0229   CBG (last 3)  No results for input(s): "GLUCAP" in the last 72 hours.  Assessment/Plan:  S/P Right and Left Thoracentesis for Bilateral pleural effusions- 1.2L of fluid removed total from both sides CV- paced rate in the 100s- on Amiodarone, Lopressor.. Cardiology consult has been obtained and is currently working up heart failure INR 2.5, okay to resume coumadin per home schedule Pulm- CXR with improvement of bilateral pleural effusions, some residual  amount remains Dispo- patient stable, further workup per cards, care per primary,   LOS: 1 day   Ellwood Handler, PA-C 05/25/2022   Chart reviewed, patient examined, agree with above. No SOB. Has been coughing a lot. CXR today looks stable compared to yesterday. She still has small right effusion and atelectasis but improved aeration from yesterday and sounds better on exam. The left diaphragm is not as visible as yesterday which may mean she has some fluid recurring on the left although small. I would continue diuretic, IS. I think she could go home and I will follow her up in my office next week with a CXR.

## 2022-05-25 NOTE — Progress Notes (Signed)
PROGRESS NOTE  Patricia [name]  CHY:[id number] DOB: 1951/07/14 DOA: 05/22/2022 PCP: Susy Frizzle, MD   Brief Narrative:  Patient is a 71 year old female with history of complete heart block status post pacemaker, Hodgkin's lymphoma, breast cancer, recent bioprosthetic valve replacement on 4/23 who presented here with complaint of shortness of breath.  She has chronic bilateral pleural effusion and was supposed to have outpatient thoracentesis.  Patient underwent bilateral thoracentesis here. CVTS was following.  Cardiology also consulted.  Possible plan for discharge tomorrow.  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  Currently she is on room air.  On Lasix 60 mg IV twice daily which we will continue.  Cardiology planning for repeat echo.  Bilateral pleural effusion: Chronic problem.  CVTS was following.  She underwent bilateral thoracentesis during this hospitalization.  Fluid was not sent for analysis.  Needs to undergo regular thoracentesis as an outpatient.  History of A-fib/SVT: Currently on metoprolol, amiodarone.  On Coumadin for anticoagulation.  Monitor on telemetry.  Monitor INR Has history of complete heart block status post pacemaker placement.  Plan for pacemaker interrogation.  History of pulmonary hypertension: Echo done on 05/17/2022 showed EF of 60 to 65%, regional wall motion abnormalities, moderately elevated pulmonary artery systolic pressure.  Cardiology planning for repeat echo.  Recent bioprosthetic aortic valve replacement: Cardiology, CVTS following  Chronic kidney disease stage IIIa: Baseline creatinine ranges from 1.2-1.4.   Currently kidney function at baseline.  History of Hodgkin's lymphoma/breast cancer: Currently stable.  Status post radiation, chemotherapy.              DVT prophylaxis:SCDs Start: 05/22/22 2243 warfarin (COUMADIN) tablet 2.5 mg  warfarin (COUMADIN) tablet 5 mg     Code Status: Full Code  Family Communication: None at the bedside  Patient status:Inpatient  Patient is from :Home  Anticipated discharge IN:[facility]  Estimated DC date:tomorrow   Consultants: CVTS,cardiology  Procedures: Thoracentesis  Antimicrobials:  Anti-infectives (From admission, onward)    None       Subjective:  Patient seen and examined at the bedside this morning.  Hemodynamically stable.  Comfortable.  EKG monitor showed heart rate of 110-120.  Denies any chest pain, shortness of breath.  Complains of some dry cough.  Eager to go home.   Objective: Vitals:   05/24/22 2207 05/24/22 2333 05/25/22 0445 05/25/22 0500  BP: 129/69 111/61 135/69   Pulse: (!) 115 99 100   Resp:  18 20   Temp:  98.4 F (36.9 C) 98.2 F (36.8 C)   TempSrc:  Oral Oral   SpO2:  95% 98%   Weight:    49.4 kg  Height:       No intake or output data in the 24 hours ending 05/25/22 0833 Filed Weights   05/25/22 0500  Weight: 49.4 kg    Examination:  General exam: Overall comfortable, not in distress, pleasant elderly female HEENT: PERRL Respiratory system:  no wheezes or crackles  Cardiovascular system: Paced rhythm, pacemaker Gastrointestinal system: Abdomen is nondistended, soft and nontender. Central nervous system: Alert and oriented Extremities: Trace bilateral lower extremity edema, no clubbing ,no cyanosis Skin: No rashes, no ulcers,no icterus  Data Reviewed: I have personally reviewed following labs and imaging studies  CBC: Recent Labs  Lab 05/22/22 1659 05/23/22 0508 05/24/22 0413 05/25/22 0238  WBC 18.6* 17.1* 10.9* 10.3  HGB 11.5* 10.6* 9.6* 10.3*  HCT 37.1 33.6* 30.5* 32.2*   MCV 87.3 87.3 86.6 85.9  PLT 446* 393 356 696   Basic Metabolic Panel: Recent Labs  Lab 05/22/22 1659 05/23/22 0508 05/24/22 0413 05/25/22 0238  NA 139 142 139 140  K 4.1 3.7 3.6 3.7  CL 98 102 103 101  CO2 '30 30 27 31  '$ GLUCOSE 116* 103* 115* 113*  BUN '23 19 17 17  '$ CREATININE 1.52* 1.32* 1.23* 1.39*  CALCIUM 9.2 9.1 8.8* 8.5*     No results found for this or any previous visit (from the past 240 hour(s)).   Radiology Studies: DG Chest 2 View  Result Date: 05/25/2022 CLINICAL DATA:  Pleural effusion. EXAM: CHEST - 2 VIEW COMPARISON:  May 24, 2022. FINDINGS: Similar right and increased left bibasilar opacities. Cardiomediastinal silhouette is similar. Left subclavian approach cardiac rhythm maintenance device. Median sternotomy. Prosthetic cardiac valve. No evidence of acute osseous abnormality. Polyarticular degenerative change. IMPRESSION: Similar right and increased left bibasilar opacities, probably a combination of layering pleural effusions and overlying atelectasis and/or consolidation. Electronically Signed   By: Margaretha Sheffield M.D.   On: 05/25/2022 07:54   IR THORACENTESIS ASP PLEURAL SPACE W/IMG GUIDE  Result Date: 05/24/2022 INDICATION: Heart valve replaced 04/08/22, pleural effusions, right side drained 05/23/22 EXAM: ULTRASOUND GUIDED LEFT THORACENTESIS MEDICATIONS: None. COMPLICATIONS: None immediate. PROCEDURE: An ultrasound guided thoracentesis was thoroughly discussed with the patient and questions answered. The benefits, risks, alternatives and complications were also discussed. The patient understands and wishes to proceed with the procedure. Written consent was obtained. Ultrasound was performed to localize and mark an adequate pocket of fluid in the left chest. The area was then prepped and draped in the normal sterile fashion. 1% Lidocaine was used for local anesthesia. Under ultrasound guidance a 6 Fr Safe-T-Centesis catheter was introduced. Thoracentesis was  performed. The catheter was removed and a dressing applied. FINDINGS: A total of approximately 53m of amber fluid was removed. IMPRESSION: Successful ultrasound guided left thoracentesis yielding 5586mof pleural fluid. Electronically Signed   By: JaCorrie Mckusick.O.   On: 05/24/2022 10:51   DG Chest 1 View  Result Date: 05/24/2022 CLINICAL DATA:  [id number] post left thoracentesis EXAM: CHEST  1 VIEW COMPARISON:  Chest x-ray from yesterday FINDINGS: Again seen are sternotomy wires, mediastinal surgical clips and mitral valve prosthesis. Left subclavian dual lead pacer is stable. There has been interval left thoracentesis. Mild residual pleural effusion. No pneumothorax. Moderate right pleural effusion. There is some atelectasis at the right mid-lower lung zone. The visualized skeletal structures are unremarkable. IMPRESSION: There has been interval left thoracentesis. No pneumothorax. Otherwise, there has been no significant interval change. Electronically Signed   By: AmFrazier Richards.D.   On: 05/24/2022 10:29   IR THORACENTESIS ASP PLEURAL SPACE W/IMG GUIDE  Result Date: 05/23/2022 INDICATION: Right pleural effusion s/p aortic valve replacement EXAM: ULTRASOUND GUIDED RIGHT THORACENTESIS MEDICATIONS: None. COMPLICATIONS: None immediate. PROCEDURE: An ultrasound guided thoracentesis was thoroughly discussed with the patient and questions answered. The benefits, risks, alternatives and complications were also discussed. The patient understands and wishes to proceed with the procedure. Written consent was obtained. Ultrasound was performed to localize and mark an adequate pocket of fluid in the right chest. The area was then prepped and draped  in the normal sterile fashion. 1% Lidocaine was used for local anesthesia. Under ultrasound guidance a 6 Fr Safe-T-Centesis catheter was introduced. Thoracentesis was performed. Tubing revealed air leak and was exchanged for new tubing and thoracentesis was resumed.  Following, the catheter was removed and a dressing applied. FINDINGS: A total of approximately 700cc of amber fluid was removed. IMPRESSION: Successful ultrasound guided right thoracentesis yielding 700cc of pleural fluid. Electronically Signed   By: Ruthann Cancer M.D.   On: 05/23/2022 11:04   DG Chest 1 View  Result Date: 05/23/2022 CLINICAL DATA:  Pleural effusion EXAM: CHEST  1 VIEW COMPARISON:  05/22/2022 FINDINGS: Right sided pacemaker overlies normal cardiac silhouette. Bilateral pleural effusions. The RIGHT pleural effusion is decreased from comparison exam. No pneumothorax. IMPRESSION: 1. No pneumothorax. 2. Decreased RIGHT pleural effusion. Electronically Signed   By: Suzy Bouchard M.D.   On: 05/23/2022 10:37    Scheduled Meds:  amiodarone  100 mg Oral Daily   atorvastatin  20 mg Oral Daily   furosemide  60 mg Intravenous BID   levothyroxine  50 mcg Oral QAC breakfast   lipase/protease/amylase  36,000 Units Oral TID AC   metoprolol tartrate  25 mg Oral BID   pantoprazole  40 mg Oral Daily   potassium chloride  40 mEq Oral Daily   warfarin  2.5 mg Oral Q M,W,F-1800   [START ON 05/26/2022] warfarin  5 mg Oral Q T,Th,S,Su-1800   Warfarin - Pharmacist Dosing Inpatient   Does not apply q1600   Continuous Infusions:   LOS: 1 day   Shelly Coss, MD [facility] P6/14/2023, 8:33 AM

## 2022-05-25 NOTE — Progress Notes (Signed)
Mobility Specialist Progress Note    05/25/22 1239  Mobility  Activity Ambulated independently in hallway  Level of Assistance Independent  Assistive Device None  Distance Ambulated (ft) 420 ft  Activity Response Tolerated well  $Mobility charge 1 Mobility   Pre-Mobility: 100 HR During Mobility: 107 HR Post-Mobility: 105 HR  Pt received in bed and agreeable. No complaints on walk. Returned to standing in room with NT present.   Patricia [name] Mobility Specialist

## 2022-05-25 NOTE — Plan of Care (Signed)
  Problem: Clinical Measurements: Goal: Diagnostic test results will improve Outcome: Progressing Goal: Respiratory complications will improve Outcome: Progressing Goal: Cardiovascular complication will be avoided Outcome: Progressing   Problem: Activity: Goal: Risk for activity intolerance will decrease Outcome: Progressing   Problem: Nutrition: Goal: Adequate nutrition will be maintained Outcome: Progressing   Problem: Coping: Goal: Level of anxiety will decrease Outcome: Progressing   Problem: Pain Managment: Goal: General experience of comfort will improve Outcome: Progressing   Problem: Safety: Goal: Ability to remain free from injury will improve Outcome: Progressing   

## 2022-05-25 NOTE — Progress Notes (Signed)
Progress Note  Patient Name: Patricia [name] Date of Encounter: 05/25/2022  Primary Cardiologist: Minus Breeding, MD   Subjective   Overnight CXR shows some re-accumulation of her effusion. Patient notes that she feels better No CP, SOB, Palpitations.   She has gotten out of bed three or four times today, and has persistent tachycardia.  Inpatient Medications    Scheduled Meds:  amiodarone  100 mg Oral Daily   atorvastatin  20 mg Oral Daily   furosemide  40 mg Intravenous BID   levothyroxine  50 mcg Oral QAC breakfast   lipase/protease/amylase  36,000 Units Oral TID AC   metoprolol tartrate  12.5 mg Oral BID   pantoprazole  40 mg Oral Daily   potassium chloride  40 mEq Oral Daily   warfarin  2.5 mg Oral Q M,W,F-1800   [START ON 05/26/2022] warfarin  5 mg Oral Q T,Th,S,Su-1800   Warfarin - Pharmacist Dosing Inpatient   Does not apply q1600   Continuous Infusions:  PRN Meds: lidocaine (PF)   Vital Signs    Vitals:   05/24/22 2207 05/24/22 2333 05/25/22 0445 05/25/22 0500  BP: 129/69 111/61 135/69   Pulse: (!) 115 99 100   Resp:  18 20   Temp:  98.4 F (36.9 C) 98.2 F (36.8 C)   TempSrc:  Oral Oral   SpO2:  95% 98%   Weight:    49.4 kg  Height:        Intake/Output Summary (Last 24 hours) at 05/25/2022 0755 Last data filed at 05/24/2022 2542 Gross per 24 hour  Intake 180 ml  Output --  Net 180 ml   Filed Weights   05/25/22 0500  Weight: 49.4 kg    Telemetry    AS-VP Tachycardia; regular - Personally Reviewed  Physical Exam   Gen: no distress, elderly female  Neck: Prominent JVD Cardiac: No Rubs or Gallops, Loud P2 with RV Heave, tachycardia, +2 radial pulses Respiratory: Crackles bilaterally, normal effort, normal  respiratory rate GI: Soft, nontender, non-distended  MS: No edema;  moves all extremities Integument: Skin feels warm Neuro:  At time of evaluation, alert and oriented to person/place/time/situation  Psych: Normal affect, patient  feels better   Labs    Chemistry Recent Labs  Lab 05/23/22 0508 05/24/22 0413 05/25/22 0238  NA 142 139 140  K 3.7 3.6 3.7  CL 102 103 101  CO2 '30 27 31  '$ GLUCOSE 103* 115* 113*  BUN '19 17 17  '$ CREATININE 1.32* 1.23* 1.39*  CALCIUM 9.1 8.8* 8.5*  GFRNONAA 43* 47* 41*  ANIONGAP '10 9 8     '$ Hematology Recent Labs  Lab 05/23/22 0508 05/24/22 0413 05/25/22 0238  WBC 17.1* 10.9* 10.3  RBC 3.85* 3.52* 3.75*  HGB 10.6* 9.6* 10.3*  HCT 33.6* 30.5* 32.2*  MCV 87.3 86.6 85.9  MCH 27.5 27.3 27.5  MCHC 31.5 31.5 32.0  RDW 16.1* 16.0* 16.1*  PLT 393 356 393    Cardiac EnzymesNo results for input(s): "TROPONINI" in the last 168 hours. No results for input(s): "TROPIPOC" in the last 168 hours.   BNP Recent Labs  Lab 05/22/22 1659  BNP 494.6*     DDimer No results for input(s): "DDIMER" in the last 168 hours.   Radiology    IR THORACENTESIS ASP PLEURAL SPACE W/IMG GUIDE  Result Date: 05/24/2022 INDICATION: Heart valve replaced 04/08/22, pleural effusions, right side drained 05/23/22 EXAM: ULTRASOUND GUIDED LEFT THORACENTESIS MEDICATIONS: None. COMPLICATIONS: None immediate. PROCEDURE: An ultrasound  guided thoracentesis was thoroughly discussed with the patient and questions answered. The benefits, risks, alternatives and complications were also discussed. The patient understands and wishes to proceed with the procedure. Written consent was obtained. Ultrasound was performed to localize and mark an adequate pocket of fluid in the left chest. The area was then prepped and draped in the normal sterile fashion. 1% Lidocaine was used for local anesthesia. Under ultrasound guidance a 6 Fr Safe-T-Centesis catheter was introduced. Thoracentesis was performed. The catheter was removed and a dressing applied. FINDINGS: A total of approximately 566m of amber fluid was removed. IMPRESSION: Successful ultrasound guided left thoracentesis yielding 5550mof pleural fluid. Electronically Signed    By: JaCorrie Mckusick.O.   On: 05/24/2022 10:51   DG Chest 1 View  Result Date: 05/24/2022 CLINICAL DATA:  [id number] post left thoracentesis EXAM: CHEST  1 VIEW COMPARISON:  Chest x-ray from yesterday FINDINGS: Again seen are sternotomy wires, mediastinal surgical clips and mitral valve prosthesis. Left subclavian dual lead pacer is stable. There has been interval left thoracentesis. Mild residual pleural effusion. No pneumothorax. Moderate right pleural effusion. There is some atelectasis at the right mid-lower lung zone. The visualized skeletal structures are unremarkable. IMPRESSION: There has been interval left thoracentesis. No pneumothorax. Otherwise, there has been no significant interval change. Electronically Signed   By: AmFrazier Richards.D.   On: 05/24/2022 10:29   IR THORACENTESIS ASP PLEURAL SPACE W/IMG GUIDE  Result Date: 05/23/2022 INDICATION: Right pleural effusion s/p aortic valve replacement EXAM: ULTRASOUND GUIDED RIGHT THORACENTESIS MEDICATIONS: None. COMPLICATIONS: None immediate. PROCEDURE: An ultrasound guided thoracentesis was thoroughly discussed with the patient and questions answered. The benefits, risks, alternatives and complications were also discussed. The patient understands and wishes to proceed with the procedure. Written consent was obtained. Ultrasound was performed to localize and mark an adequate pocket of fluid in the right chest. The area was then prepped and draped in the normal sterile fashion. 1% Lidocaine was used for local anesthesia. Under ultrasound guidance a 6 Fr Safe-T-Centesis catheter was introduced. Thoracentesis was performed. Tubing revealed air leak and was exchanged for new tubing and thoracentesis was resumed. Following, the catheter was removed and a dressing applied. FINDINGS: A total of approximately 700cc of amber fluid was removed. IMPRESSION: Successful ultrasound guided right thoracentesis yielding 700cc of pleural fluid. Electronically Signed    By: DyRuthann Cancer.D.   On: 05/23/2022 11:04   DG Chest 1 View  Result Date: 05/23/2022 CLINICAL DATA:  Pleural effusion EXAM: CHEST  1 VIEW COMPARISON:  05/22/2022 FINDINGS: Right sided pacemaker overlies normal cardiac silhouette. Bilateral pleural effusions. The RIGHT pleural effusion is decreased from comparison exam. No pneumothorax. IMPRESSION: 1. No pneumothorax. 2. Decreased RIGHT pleural effusion. Electronically Signed   By: StSuzy Bouchard.D.   On: 05/23/2022 10:37      Patient Profile     89.o. 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 (on atorvastatin 20 mg) - increase diuretic to 60 IV BID - will order BNP and limited echo for 05/26/22 - if recurrent volume overload again we would consider euvolemic RHC  CHD s/p MDT PPM (Dependent) Hx of atrial tachycardia Long RP Tachycardia  - suspect sinus but will get device interrogated - will increase metoprolol back to home dose 25 mg PO BID (was  lowered with hypotension)  AFL s/p ablation with recurent PAF post 4/23 surgery - on amiodarone 100 mg PO daily  If patient does well 05/26/22 and is euvolemic, potentially 05/26/22 discharge if primary TCTS agrees  For questions or updates, please contact [facility] Please consult [email or web address] for contact info under Cardiology/STEMI.      Rudean Haskell, MD Cardiologist [facility] [place] [address], [address] [place], [place] [address] [phone]  7:55 AM

## 2022-05-25 NOTE — Progress Notes (Signed)
Cardiac Individual Treatment Plan  Patient Details  Name: Patricia [name] MRN: [id number] Date of Birth: 08-Jun-1951 Referring Provider:   Flowsheet Row Cardiac Rehab from 05/12/2022 in [facility] Cardiac and Pulmonary Rehab  Referring Provider Eleonore Chiquito MD       Initial Encounter Date:  Flowsheet Row Cardiac Rehab from 05/12/2022 in [facility] Cardiac and Pulmonary Rehab  Date 05/12/22       Visit Diagnosis: S/P aortic valve replacement  Patient's Home Medications on Admission: No current facility-administered medications for this visit. No current outpatient medications on file.  Facility-Administered Medications Ordered in Other Visits:    amiodarone (PACERONE) tablet 100 mg, 100 mg, Oral, Daily, Gean Birchwood N, MD, 100 mg at 05/25/22 0900   atorvastatin (LIPITOR) tablet 20 mg, 20 mg, Oral, Daily, Rise Patience, MD, 20 mg at 05/25/22 0900   furosemide (LASIX) injection 60 mg, 60 mg, Intravenous, BID, Chandrasekhar, Mahesh A, MD   levothyroxine (SYNTHROID) tablet 50 mcg, 50 mcg, Oral, QAC breakfast, Rise Patience, MD, 50 mcg at 05/25/22 9390   lidocaine (PF) (XYLOCAINE) 1 % injection, , , PRN, Boisseau, Hayley, PA, 5 mL at 05/23/22 0926   lipase/protease/amylase (CREON) capsule 36,000 Units, 36,000 Units, Oral, TID AC, Rise Patience, MD, 36,000 Units at 05/25/22 3009   metoprolol tartrate (LOPRESSOR) tablet 25 mg, 25 mg, Oral, BID, Chandrasekhar, Mahesh A, MD, 25 mg at 05/25/22 0900   pantoprazole (PROTONIX) EC tablet 40 mg, 40 mg, Oral, Daily, Hal Hope, Arshad N, MD, 40 mg at 05/25/22 0900   potassium chloride (KLOR-CON) packet 40 mEq, 40 mEq, Oral, Daily, Barrett, Rhonda G, PA-C, 40 mEq at 05/25/22 0900   warfarin (COUMADIN) tablet 2.5 mg, 2.5 mg, Oral, Q M,W,F-1800, Barrett, Erin R, PA-C   [START ON 05/26/2022] warfarin (COUMADIN) tablet 5 mg, 5 mg, Oral, Q T,Th,S,Su-1800, Barrett, Erin R, PA-C   Warfarin - Pharmacist Dosing Inpatient, , Does not apply,  q1600, Franky Macho, [facility], Given at 05/23/22 1606  Past Medical History: Past Medical History:  Diagnosis Date   Atrial flutter ([place])    Ablated 1998 Dr. Caryl Comes   Basal cell carcinoma 06/05/2019   R low back, ED[facility] 07/16/19   Basal cell carcinoma 04/15/2010   Right post shoulder   Basal cell carcinoma 04/15/2010   Mid back   Basal cell carcinoma (BCC) 09/29/2016   Left post shoulder. Superficial and nodular.   BCC (basal cell carcinoma) 07/21/2021   right mid back, [facility] 09/15/2021   BCC (basal cell carcinoma) 01/27/2022   left lower abdomen   Bleeding gastric ulcer    back in 2000   Breast cancer ([place])    COPD (chronic obstructive pulmonary disease) ([place])    from radiation   GERD (gastroesophageal reflux disease)    History of basal cell carcinoma (BCC) 03/22/2021   right neck and right medial ear, Moh's   Hodgkin's lymphoma ([place])    Treated with radiation and chemo   Mitral regurgitation    pvc   Osteoporosis    lumbar verterbral fracture, left ankle fracture, dexa 2015   Pancreatitis    Pneumonia    Prediabetes    Presence of permanent cardiac pacemaker    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 09/13/2017   25 mm Medtronic Mosaic porcine stented bioprosthetic tissue valve   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   Left medial lower leg   Ulcer     Tobacco Use: Social History   Tobacco Use  Smoking  Status Never  Smokeless Tobacco Never    Labs: Review Flowsheet  More data exists      Latest Ref Rng & Units 12/19/2019 03/25/2021 04/05/2022 04/07/2022  Labs for ITP Cardiac and Pulmonary Rehab  Cholestrol <200 mg/dL - 136  - -  LDL (calc) mg/dL (calc) - 57  - -  HDL-C > OR = 50 mg/dL - 62  - -  Trlycerides <150 mg/dL - 88  - -  Hemoglobin A1c 4.8 - 5.6 % 6.1  6.3  6.8  -  PH, Arterial 7.35 - 7.45 - - 7.45  7.482  7.457  7.396  7.416  7.401  7.472  7.540  7.406  7.446   PCO2 arterial 32 - 48 mmHg - - 44  34.6  36.5  44.5  46.1   51.1  43.7  37.8  35.8  45.5   Bicarbonate 20.0 - 28.0 mmol/L - - 30.6  25.8  25.9  27.3  29.6  31.7  32.0  32.4  22.5  31.3   TCO2 22 - 32 mmol/L - - - '27  27  29  30  31  '$ 33  33  31  33  32  34  24  28  33  32   Acid-base deficit 0.0 - 2.0 mmol/L - - - 2.0   O2 Saturation % - - 98.5  100  100  100  100  100  100  100  81  100       04/08/2022  Labs for ITP Cardiac and Pulmonary Rehab  Cholestrol -  LDL (calc) -  HDL-C -  Trlycerides -  Hemoglobin A1c -  PH, Arterial 7.368  7.520   PCO2 arterial 43.6  29.2   Bicarbonate 24.8  23.7   TCO2 26  25   Acid-base deficit -  O2 Saturation 100  99      Exercise Target Goals: Exercise Program Goal: Individual exercise prescription set using results from initial 6 min walk test and THRR while considering  patient's activity barriers and safety.   Exercise Prescription Goal: Initial exercise prescription builds to 30-45 minutes a day of aerobic activity, 2-3 days per week.  Home exercise guidelines will be given to patient during program as part of exercise prescription that the participant will acknowledge.   Education: Aerobic Exercise: - Group verbal and visual presentation on the components of exercise prescription. Introduces F.I.T.T principle from ACSM for exercise prescriptions.  Reviews F.I.T.T. principles of aerobic exercise including progression. Written material given at graduation.   Education: Resistance Exercise: - Group verbal and visual presentation on the components of exercise prescription. Introduces F.I.T.T principle from ACSM for exercise prescriptions  Reviews F.I.T.T. principles of resistance exercise including progression. Written material given at graduation.    Education: Exercise & Equipment Safety: - Individual verbal instruction and demonstration of equipment use and safety with use of the equipment. Flowsheet Row Cardiac Rehab from 05/19/2022 in [facility] Cardiac and Pulmonary Rehab  Education need identified  05/12/22       Education: Exercise Physiology & General Exercise Guidelines: - Group verbal and written instruction with models to review the exercise physiology of the cardiovascular system and associated critical values. Provides general exercise guidelines with specific guidelines to those with heart or lung disease.    Education: Flexibility, Balance, Mind/Body Relaxation: - Group verbal and visual presentation with interactive activity on the components of exercise prescription. Introduces F.I.T.T principle from ACSM for exercise prescriptions. Reviews F.I.T.T. principles of  flexibility and balance exercise training including progression. Also discusses the mind body connection.  Reviews various relaxation techniques to help reduce and manage stress (i.e. Deep breathing, progressive muscle relaxation, and visualization). Balance handout provided to take home. Written material given at graduation.   Activity Barriers & Risk Stratification:  Activity Barriers & Cardiac Risk Stratification - 05/12/22 1108       Activity Barriers & Cardiac Risk Stratification   Activity Barriers Arthritis;Joint Problems;Shortness of Breath;Deconditioning;Muscular Weakness   broke left ankle (plate/screws), bilateral knee arthritis   Cardiac Risk Stratification Moderate             6 Minute Walk:  6 Minute Walk     Row Name 05/12/22 1106         6 Minute Walk   Phase Initial     Distance 878 feet     Walk Time 6 minutes     # of Rest Breaks 0     MPH 1.66     METS 2.63     RPE 15     Perceived Dyspnea  3     VO2 Peak 9.21     Symptoms Yes (comment)     Comments fatigue, SOB     Resting HR 92 bpm     Resting BP 134/62     Resting Oxygen Saturation  98 %     Exercise Oxygen Saturation  during 6 min walk 94 %     Max Ex. HR 110 bpm     Max Ex. BP 154/74     2 Minute Post BP 124/70              Oxygen Initial Assessment:   Oxygen Re-Evaluation:   Oxygen Discharge (Final  Oxygen Re-Evaluation):   Initial Exercise Prescription:  Initial Exercise Prescription - 05/12/22 1100       Date of Initial Exercise RX and Referring Provider   Date 05/12/22    Referring Provider Eleonore Chiquito MD      Oxygen   Maintain Oxygen Saturation 88% or higher      Treadmill   MPH 1.5    Grade 0.5    Minutes 15    METs 2.26      Recumbant Bike   Level 1    RPM 50    Watts 10    Minutes 15    METs 2      NuStep   Level 1    SPM 70    Minutes 15    METs 1.5      Biostep-RELP   Level 1    SPM 50    Minutes 15    METs 1.8      Track   Laps 15    Minutes 15    METs 1.82      Prescription Details   Frequency (times per week) 3    Duration Progress to 30 minutes of continuous aerobic without signs/symptoms of physical distress      Intensity   THRR 40-80% of Max Heartrate 114-138    Ratings of Perceived Exertion 11-13    Perceived Dyspnea 0-4      Progression   Progression Continue to progress workloads to maintain intensity without signs/symptoms of physical distress.      Resistance Training   Training Prescription Yes    Weight 3 lb    Reps 10-15             Perform Capillary Blood Glucose checks as  needed.  Exercise Prescription Changes:   Exercise Prescription Changes     Row Name 05/12/22 1100 05/24/22 0800           Response to Exercise   Blood Pressure (Admit) 134/62 102/60      Blood Pressure (Exercise) 154/74 118/64      Blood Pressure (Exit) 124/70 112/66      Heart Rate (Admit) 92 bpm 86 bpm      Heart Rate (Exercise) 110 bpm 95 bpm      Heart Rate (Exit) 103 bpm 89 bpm      Oxygen Saturation (Admit) 98 % --      Oxygen Saturation (Exercise) 94 % --      Oxygen Saturation (Exit) 98 % --      Rating of Perceived Exertion (Exercise) 15 12      Perceived Dyspnea (Exercise) 3 --      Symptoms fatigue, SOB none      Comments 6MWT third full day of exercise      Duration Progress to 30 minutes of  aerobic without  signs/symptoms of physical distress Progress to 30 minutes of  aerobic without signs/symptoms of physical distress      Intensity THRR unchanged THRR unchanged        Progression   Progression Continue to progress workloads to maintain intensity without signs/symptoms of physical distress. Continue to progress workloads to maintain intensity without signs/symptoms of physical distress.      Average METs 2.63 2.34        Resistance Training   Training Prescription -- Yes      Weight -- 1 lb      Reps -- 10-15        Interval Training   Interval Training -- No        Recumbant Bike   Level -- 1      Watts -- 12      Minutes -- 15      METs -- 2.94        NuStep   Level -- 3      Minutes -- 15      METs -- 2.1        Track   Laps -- 24      Minutes -- 15      METs -- 2.31        Oxygen   Maintain Oxygen Saturation -- 88% or higher               Exercise Comments:   Exercise Comments     Row Name 05/16/22 0926           Exercise Comments First full day of exercise!  Patient was oriented to gym and equipment including functions, settings, policies, and procedures.  Patient's individual exercise prescription and treatment plan were reviewed.  All starting workloads were established based on the results of the 6 minute walk test done at initial orientation visit.  The plan for exercise progression was also introduced and progression will be customized based on patient's performance and goals.                Exercise Goals and Review:   Exercise Goals     Row Name 05/12/22 1126             Exercise Goals   Increase Physical Activity Yes       Intervention Provide advice, education, support and counseling about physical activity/exercise needs.;Develop an individualized exercise prescription for  aerobic and resistive training based on initial evaluation findings, risk stratification, comorbidities and participant's personal goals.       Expected Outcomes  Short Term: Attend rehab on a regular basis to increase amount of physical activity.;Long Term: Add in home exercise to make exercise part of routine and to increase amount of physical activity.;Long Term: Exercising regularly at least 3-5 days a week.       Increase Strength and Stamina Yes       Intervention Provide advice, education, support and counseling about physical activity/exercise needs.;Develop an individualized exercise prescription for aerobic and resistive training based on initial evaluation findings, risk stratification, comorbidities and participant's personal goals.       Expected Outcomes Short Term: Increase workloads from initial exercise prescription for resistance, speed, and METs.;Short Term: Perform resistance training exercises routinely during rehab and add in resistance training at home;Long Term: Improve cardiorespiratory fitness, muscular endurance and strength as measured by increased METs and functional capacity (6MWT)       Able to understand and use rate of perceived exertion (RPE) scale Yes       Intervention Provide education and explanation on how to use RPE scale       Expected Outcomes Short Term: Able to use RPE daily in rehab to express subjective intensity level;Long Term:  Able to use RPE to guide intensity level when exercising independently       Able to understand and use Dyspnea scale Yes       Intervention Provide education and explanation on how to use Dyspnea scale       Expected Outcomes Short Term: Able to use Dyspnea scale daily in rehab to express subjective sense of shortness of breath during exertion;Long Term: Able to use Dyspnea scale to guide intensity level when exercising independently       Knowledge and understanding of Target Heart Rate Range (THRR) Yes       Intervention Provide education and explanation of THRR including how the numbers were predicted and where they are located for reference       Expected Outcomes Short Term: Able to  state/look up THRR;Long Term: Able to use THRR to govern intensity when exercising independently;Short Term: Able to use daily as guideline for intensity in rehab       Able to check pulse independently Yes       Intervention Provide education and demonstration on how to check pulse in carotid and radial arteries.;Review the importance of being able to check your own pulse for safety during independent exercise       Expected Outcomes Short Term: Able to explain why pulse checking is important during independent exercise;Long Term: Able to check pulse independently and accurately       Understanding of Exercise Prescription Yes       Intervention Provide education, explanation, and written materials on patient's individual exercise prescription       Expected Outcomes Short Term: Able to explain program exercise prescription;Long Term: Able to explain home exercise prescription to exercise independently                Exercise Goals Re-Evaluation :  Exercise Goals Re-Evaluation     Row Name 05/16/22 0926 05/24/22 0826           Exercise Goal Re-Evaluation   Exercise Goals Review Able to understand and use rate of perceived exertion (RPE) scale;Able to understand and use Dyspnea scale;Knowledge and understanding of Target Heart Rate Range (THRR);Understanding of Exercise  Prescription Increase Physical Activity;Understanding of Exercise Prescription;Increase Strength and Stamina      Comments Reviewed RPE and dyspnea scales, THR and program prescription with pt today.  Pt voiced understanding and was given a copy of goals to take home. Ziza is off to a good start in rehab.  She has completed her first three full days of exercise.  She is already up to 24 laps.  We will continue to monitor her progress.      Expected Outcomes Short: Use RPE daily to regulate intensity. Long: Follow program prescription in THR. Short: Continue to attend rehab regularly Long: Continue to follow program  prescription               Discharge Exercise Prescription (Final Exercise Prescription Changes):  Exercise Prescription Changes - 05/24/22 0800       Response to Exercise   Blood Pressure (Admit) 102/60    Blood Pressure (Exercise) 118/64    Blood Pressure (Exit) 112/66    Heart Rate (Admit) 86 bpm    Heart Rate (Exercise) 95 bpm    Heart Rate (Exit) 89 bpm    Rating of Perceived Exertion (Exercise) 12    Symptoms none    Comments third full day of exercise    Duration Progress to 30 minutes of  aerobic without signs/symptoms of physical distress    Intensity THRR unchanged      Progression   Progression Continue to progress workloads to maintain intensity without signs/symptoms of physical distress.    Average METs 2.34      Resistance Training   Training Prescription Yes    Weight 1 lb    Reps 10-15      Interval Training   Interval Training No      Recumbant Bike   Level 1    Watts 12    Minutes 15    METs 2.94      NuStep   Level 3    Minutes 15    METs 2.1      Track   Laps 24    Minutes 15    METs 2.31      Oxygen   Maintain Oxygen Saturation 88% or higher             Nutrition:  Target Goals: Understanding of nutrition guidelines, daily intake of sodium '1500mg'$ , cholesterol '200mg'$ , calories 30% from fat and 7% or less from saturated fats, daily to have 5 or more servings of fruits and vegetables.  Education: All About Nutrition: -Group instruction provided by verbal, written material, interactive activities, discussions, models, and posters to present general guidelines for heart healthy nutrition including fat, fiber, MyPlate, the role of sodium in heart healthy nutrition, utilization of the nutrition label, and utilization of this knowledge for meal planning. Follow up email sent as well. Written material given at graduation. Flowsheet Row Cardiac Rehab from 05/19/2022 in [facility] Cardiac and Pulmonary Rehab  Education need identified 05/12/22        Biometrics:  Pre Biometrics - 05/12/22 1127       Pre Biometrics   Height 5' 5.1" (1.654 m)    Weight 118 lb 3.2 oz (53.6 kg)    BMI (Calculated) 19.6    Single Leg Stand 3.3 seconds              Nutrition Therapy Plan and Nutrition Goals:  Nutrition Therapy & Goals - 05/12/22 1128       Intervention Plan   Intervention Prescribe, educate  and counsel regarding individualized specific dietary modifications aiming towards targeted core components such as weight, hypertension, lipid management, diabetes, heart failure and other comorbidities.    Expected Outcomes Short Term Goal: Understand basic principles of dietary content, such as calories, fat, sodium, cholesterol and nutrients.;Long Term Goal: Adherence to prescribed nutrition plan.;Short Term Goal: A plan has been developed with personal nutrition goals set during dietitian appointment.             Nutrition Assessments:  MEDIFICTS Score Key: ?70 Need to make dietary changes  40-70 Heart Healthy Diet ? 40 Therapeutic Level Cholesterol Diet  Flowsheet Row Cardiac Rehab from 05/12/2022 in [facility] Cardiac and Pulmonary Rehab  Picture Your Plate Total Score on Admission 76      Picture Your Plate Scores: <06 Unhealthy dietary pattern with much room for improvement. 41-50 Dietary pattern unlikely to meet recommendations for good health and room for improvement. 51-60 More healthful dietary pattern, with some room for improvement.  >60 Healthy dietary pattern, although there may be some specific behaviors that could be improved.    Nutrition Goals Re-Evaluation:   Nutrition Goals Discharge (Final Nutrition Goals Re-Evaluation):   Psychosocial: Target Goals: Acknowledge presence or absence of significant depression and/or stress, maximize coping skills, provide positive support system. Participant is able to verbalize types and ability to use techniques and skills needed for reducing stress and depression.    Education: Stress, Anxiety, and Depression - Group verbal and visual presentation to define topics covered.  Reviews how body is impacted by stress, anxiety, and depression.  Also discusses healthy ways to reduce stress and to treat/manage anxiety and depression.  Written material given at graduation.   Education: Sleep Hygiene -Provides group verbal and written instruction about how sleep can affect your health.  Define sleep hygiene, discuss sleep cycles and impact of sleep habits. Review good sleep hygiene tips.    Initial Review & Psychosocial Screening:  Initial Psych Review & Screening - 05/04/22 0943       Initial Review   Current issues with None Identified      Family Dynamics   Good Support System? Yes   friends and neighbors   Comments lost husband 08/20/2017 from pancreatic cancer       Barriers   Psychosocial barriers to participate in program There are no identifiable barriers or psychosocial needs.      Screening Interventions   Interventions Encouraged to exercise;To provide support and resources with identified psychosocial needs;Provide feedback about the scores to participant    Expected Outcomes Short Term goal: Utilizing psychosocial counselor, staff and physician to assist with identification of specific Stressors or current issues interfering with healing process. Setting desired goal for each stressor or current issue identified.;Long Term Goal: Stressors or current issues are controlled or eliminated.;Short Term goal: Identification and review with participant of any Quality of Life or Depression concerns found by scoring the questionnaire.;Long Term goal: The participant improves quality of Life and PHQ9 Scores as seen by post scores and/or verbalization of changes             Quality of Life Scores:   Scores of 19 and below usually indicate a poorer quality of life in these areas.  A difference of  2-3 points is a clinically meaningful difference.  A  difference of 2-3 points in the total score of the Quality of Life Index has been associated with significant improvement in overall quality of life, self-image, physical symptoms, and general health in studies  assessing change in quality of life.  PHQ-9: Review Flowsheet  More data exists      05/12/2022 03/22/2021 12/19/2019 02/09/2018 01/23/2018  Depression screen PHQ 2/9  Decreased Interest 0 0 0 0 1  Down, Depressed, Hopeless 0 0 0 0 1  PHQ - 2 Score 0 0 0 0 2  Altered sleeping 1 - - - 2  Tired, decreased energy 3 - - - 1  Change in appetite 3 - - - 0  Feeling bad or failure about yourself  0 - - - 0  Trouble concentrating 0 - - - 0  Moving slowly or fidgety/restless 1 - - - 0  Suicidal thoughts 0 - - - 0  PHQ-9 Score 8 - - - 5  Difficult doing work/chores Somewhat difficult - - - Not difficult at all   Interpretation of Total Score  Total Score Depression Severity:  1-4 = Minimal depression, 5-9 = Mild depression, 10-14 = Moderate depression, 15-19 = Moderately severe depression, 20-27 = Severe depression   Psychosocial Evaluation and Intervention:  Psychosocial Evaluation - 05/04/22 0954       Psychosocial Evaluation & Interventions   Interventions Encouraged to exercise with the program and follow exercise prescription    Comments Sinda has no barriers to attending the program. [facility] is ready to start the program and work on her strength and stamina and learn about caring for herself. She has completed CR in the past. She is a widow and has friends and neighbors that check on her daily. 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    Expected Outcomes STG Maeby will attend all scheduled sessions, she will gain back her strength and stamina to get back to her live and hobby of traveling. LTG Lillieann will continue to progress after discharge    Continue Psychosocial Services  Follow up required by staff             Psychosocial  Re-Evaluation:   Psychosocial Discharge (Final Psychosocial Re-Evaluation):   Vocational Rehabilitation: Provide vocational rehab assistance to qualifying candidates.   Vocational Rehab Evaluation & Intervention:   Education: Education Goals: Education classes will be provided on a variety of topics geared toward better understanding of heart health and risk factor modification. Participant will state understanding/return demonstration of topics presented as noted by education test scores.  Learning Barriers/Preferences:   General Cardiac Education Topics:  AED/CPR: - Group verbal and written instruction with the use of models to demonstrate the basic use of the AED with the basic ABC's of resuscitation.   Anatomy and Cardiac Procedures: - Group verbal and visual presentation and models provide information about basic cardiac anatomy and function. Reviews the testing methods done to diagnose heart disease and the outcomes of the test results. Describes the treatment choices: Medical Management, Angioplasty, or Coronary Bypass Surgery for treating various heart conditions including Myocardial Infarction, Angina, Valve Disease, and Cardiac Arrhythmias.  Written material given at graduation. Flowsheet Row Cardiac Rehab from 05/19/2022 in [facility] Cardiac and Pulmonary Rehab  Education need identified 05/12/22       Medication Safety: - Group verbal and visual instruction to review commonly prescribed medications for heart and lung disease. Reviews the medication, class of the drug, and side effects. Includes the steps to properly store meds and maintain the prescription regimen.  Written material given at graduation.   Intimacy: - Group verbal instruction through game format to discuss how heart and lung disease can  affect sexual intimacy. Written material given at graduation..   Know Your Numbers and Heart Failure: - Group verbal and visual instruction to discuss disease risk factors  for cardiac and pulmonary disease and treatment options.  Reviews associated critical values for Overweight/Obesity, Hypertension, Cholesterol, and Diabetes.  Discusses basics of heart failure: signs/symptoms and treatments.  Introduces Heart Failure Zone chart for action plan for heart failure.  Written material given at graduation. Flowsheet Row Cardiac Rehab from 05/19/2022 in [facility] Cardiac and Pulmonary Rehab  Date 05/19/22  Educator SB  Instruction Review Code 1- Verbalizes Understanding       Infection Prevention: - Provides verbal and written material to individual with discussion of infection control including proper hand washing and proper equipment cleaning during exercise session. Flowsheet Row Cardiac Rehab from 05/19/2022 in [facility] Cardiac and Pulmonary Rehab  Education need identified 05/12/22       Falls Prevention: - Provides verbal and written material to individual with discussion of falls prevention and safety. Flowsheet Row Cardiac Rehab from 05/19/2022 in [facility] Cardiac and Pulmonary Rehab  Date 05/04/22  Educator SB  Instruction Review Code 1- Verbalizes Understanding       Other: -Provides group and verbal instruction on various topics (see comments)   Knowledge Questionnaire Score:   Core Components/Risk Factors/Patient Goals at Admission:  Personal Goals and Risk Factors at Admission - 05/12/22 1131       Core Components/Risk Factors/Patient Goals on Admission    Weight Management Yes;Weight Gain;Weight Maintenance    Intervention Weight Management: Develop a combined nutrition and exercise program designed to reach desired caloric intake, while maintaining appropriate intake of nutrient and fiber, sodium and fats, and appropriate energy expenditure required for the weight goal.;Weight Management: Provide education and appropriate resources to help participant work on and attain dietary goals.    Admit Weight 118 lb 3.2 oz (53.6 kg)    Goal Weight: Short Term  120 lb (54.4 kg)    Goal Weight: Long Term 120 lb (54.4 kg)    Expected Outcomes Short Term: Continue to assess and modify interventions until short term weight is achieved;Long Term: Adherence to nutrition and physical activity/exercise program aimed toward attainment of established weight goal;Weight Gain: Understanding of general recommendations for a high calorie, high protein meal plan that promotes weight gain by distributing calorie intake throughout the day with the consumption for 4-5 meals, snacks, and/or supplements;Weight Maintenance: Understanding of the daily nutrition guidelines, which includes 25-35% calories from fat, 7% or less cal from saturated fats, less than '200mg'$  cholesterol, less than 1.5gm of sodium, & 5 or more servings of fruits and vegetables daily    Diabetes Yes    Intervention Provide education about signs/symptoms and action to take for hypo/hyperglycemia.;Provide education about proper nutrition, including hydration, and aerobic/resistive exercise prescription along with prescribed medications to achieve blood glucose in normal ranges: Fasting glucose 65-99 mg/dL    Expected Outcomes Short Term: Participant verbalizes understanding of the signs/symptoms and immediate care of hyper/hypoglycemia, proper foot care and importance of medication, aerobic/resistive exercise and nutrition plan for blood glucose control.;Long Term: Attainment of HbA1C < 7%.    Lipids Yes    Intervention Provide education and support for participant on nutrition & aerobic/resistive exercise along with prescribed medications to achieve LDL '70mg'$ , HDL >'40mg'$ .    Expected Outcomes Short Term: Participant states understanding of desired cholesterol values and is compliant with medications prescribed. Participant is following exercise prescription and nutrition guidelines.;Long Term: Cholesterol controlled with medications as prescribed,  with individualized exercise RX and with personalized nutrition plan.  Value goals: LDL < '70mg'$ , HDL > 40 mg.             Education:Diabetes - Individual verbal and written instruction to review signs/symptoms of diabetes, desired ranges of glucose level fasting, after meals and with exercise. Acknowledge that pre and post exercise glucose checks will be done for 3 sessions at entry of program. Flowsheet Row Cardiac Rehab from 05/19/2022 in [facility] Cardiac and Pulmonary Rehab  Date 05/12/22       Core Components/Risk Factors/Patient Goals Review:    Core Components/Risk Factors/Patient Goals at Discharge (Final Review):    ITP Comments:  ITP Comments     Row Name 05/04/22 0958 05/12/22 1104 05/16/22 0925 05/25/22 0934     ITP Comments Virtual orientation call completed today. shehas an appointment on Date: 05/12/2022  for EP eval and gym Orientation.  Documentation of diagnosis can be found in [facility] Date: 04/07/2022 . Completed 6MWT and gym orientation. Initial ITP created and sent for review to Dr. Ramonita Lab, covering for Dr. Sabra Heck Medical Director. First full day of exercise!  Patient was oriented to gym and equipment including functions, settings, policies, and procedures.  Patient's individual exercise prescription and treatment plan were reviewed.  All starting workloads were established based on the results of the 6 minute walk test done at initial orientation visit.  The plan for exercise progression was also introduced and progression will be customized based on patient's performance and goals. 30 Day review completed. Medical Director ITP review done, changes made as directed, and signed approval by Medical Director.    New to program             Comments:

## 2022-05-26 ENCOUNTER — Inpatient Hospital Stay (HOSPITAL_COMMUNITY): Payer: Medicare PPO

## 2022-05-26 DIAGNOSIS — R0602 Shortness of breath: Secondary | ICD-10-CM

## 2022-05-26 DIAGNOSIS — J90 Pleural effusion, not elsewhere classified: Secondary | ICD-10-CM | POA: Diagnosis not present

## 2022-05-26 LAB — ECHOCARDIOGRAM LIMITED
AR max vel: 2.38 cm2
AV Area VTI: 2.2 cm2
AV Area mean vel: 2.26 cm2
AV Mean grad: 4.7 mmHg
AV Peak grad: 8.2 mmHg
Ao pk vel: 1.43 m/s
Area-P 1/2: 5.27 cm2
Height: 65 in
MV VTI: 2.04 cm2
S' Lateral: 2.4 cm
Weight: 1707.24 oz

## 2022-05-26 LAB — BASIC METABOLIC PANEL
Anion gap: 10 (ref 5–15)
BUN: 20 mg/dL (ref 8–23)
CO2: 29 mmol/L (ref 22–32)
Calcium: 8.4 mg/dL — ABNORMAL LOW (ref 8.9–10.3)
Chloride: 97 mmol/L — ABNORMAL LOW (ref 98–111)
Creatinine, Ser: 1.32 mg/dL — ABNORMAL HIGH (ref 0.44–1.00)
GFR, Estimated: 43 mL/min — ABNORMAL LOW (ref >60)
Glucose, Bld: 117 mg/dL — ABNORMAL HIGH (ref 70–99)
Potassium: 3.8 mmol/L (ref 3.5–5.1)
Sodium: 136 mmol/L (ref 135–145)

## 2022-05-26 LAB — PROTIME-INR
INR: 2.4 — ABNORMAL HIGH (ref 0.8–1.2)
Prothrombin Time: 25.6 seconds — ABNORMAL HIGH (ref 11.4–15.2)

## 2022-05-26 LAB — BRAIN NATRIURETIC PEPTIDE: B Natriuretic Peptide: 216 pg/mL — ABNORMAL HIGH (ref 0.0–100.0)

## 2022-05-26 MED ORDER — FUROSEMIDE 10 MG/ML IJ SOLN
120.0000 mg | Freq: Two times a day (BID) | INTRAVENOUS | Status: DC
  Administered 2022-05-26 – 2022-05-27 (×2): 120 mg via INTRAVENOUS
  Filled 2022-05-26: qty 12
  Filled 2022-05-26: qty 2
  Filled 2022-05-26: qty 10

## 2022-05-26 MED ORDER — POTASSIUM CHLORIDE CRYS ER 20 MEQ PO TBCR
40.0000 meq | EXTENDED_RELEASE_TABLET | Freq: Two times a day (BID) | ORAL | Status: DC
  Administered 2022-05-26 – 2022-05-28 (×4): 40 meq via ORAL
  Filled 2022-05-26 (×4): qty 2

## 2022-05-26 MED ORDER — FUROSEMIDE 10 MG/ML IJ SOLN
80.0000 mg | Freq: Two times a day (BID) | INTRAMUSCULAR | Status: DC
  Administered 2022-05-26: 80 mg via INTRAVENOUS
  Filled 2022-05-26: qty 8

## 2022-05-26 MED ORDER — PERFLUTREN LIPID MICROSPHERE
1.0000 mL | INTRAVENOUS | Status: AC | PRN
  Administered 2022-05-26: 2 mL via INTRAVENOUS

## 2022-05-26 MED ORDER — FUROSEMIDE 10 MG/ML IJ SOLN: 80.0000 mg | Freq: Two times a day (BID) | INTRAMUSCULAR | Status: DC

## 2022-05-26 MED ORDER — ORAL CARE MOUTH RINSE: 15.0000 mL | OROMUCOSAL | Status: DC | PRN

## 2022-05-26 MED ORDER — METOPROLOL TARTRATE 25 MG PO TABS
37.5000 mg | ORAL_TABLET | Freq: Two times a day (BID) | ORAL | Status: DC
  Administered 2022-05-26 (×2): 37.5 mg via ORAL
  Filled 2022-05-26 (×2): qty 1

## 2022-05-26 NOTE — Plan of Care (Signed)
  Problem: Clinical Measurements: Goal: Will remain free from infection Outcome: Progressing Goal: Diagnostic test results will improve Outcome: Progressing Goal: Respiratory complications will improve Outcome: Progressing Goal: Cardiovascular complication will be avoided Outcome: Progressing   Problem: Activity: Goal: Risk for activity intolerance will decrease Outcome: Progressing   Problem: Nutrition: Goal: Adequate nutrition will be maintained Outcome: Progressing   Problem: Coping: Goal: Level of anxiety will decrease Outcome: Progressing   Problem: Pain Managment: Goal: General experience of comfort will improve Outcome: Progressing   Problem: Safety: Goal: Ability to remain free from injury will improve Outcome: Progressing   

## 2022-05-26 NOTE — Progress Notes (Signed)
Mobility Specialist Progress Note    05/26/22 1555  Mobility  Activity Ambulated independently in hallway  Level of Assistance Independent  Assistive Device None  Distance Ambulated (ft) 800 ft  Activity Response Tolerated well  $Mobility charge 1 Mobility   Pt received in chair and agreeable. No complaints on walk. Returned to room with call bell in reach.    Hildred Alamin Mobility Specialist

## 2022-05-26 NOTE — Progress Notes (Signed)
  Echocardiogram 2D Echocardiogram has been performed.  Patricia [name] 05/26/2022, 10:58 AM

## 2022-05-26 NOTE — Progress Notes (Signed)
      [address]       [place] [address]             [phone]       Subjective:  Patient has no new complaints.  Continues to feel like she is breathing better.  Ambulating independently in the hallways.  Objective: Vital signs in last 24 hours: Temp:  [97.8 F (36.6 C)-98.3 F (36.8 C)] 97.9 F (36.6 C) (06/15 0401) Pulse Rate:  [96-114] 108 (06/14 2112) Cardiac Rhythm: Ventricular paced (06/14 1900) Resp:  [18-24] 18 (06/15 0401) BP: (104-138)/(60-88) 120/65 (06/15 0401) SpO2:  [94 %-97 %] 97 % (06/15 0401) Weight:  [48.4 kg] 48.4 kg (06/15 0611)  Intake/Output from previous day: 06/14 0701 - 06/15 0700 In: 600 [P.O.:600] Out: 0   General appearance: alert, cooperative, and no distress Heart: regular rate and rhythm Lungs: clear to auscultation bilaterally Abdomen: soft, non-tender; bowel sounds normal; no masses,  no organomegaly Extremities: extremities normal, atraumatic, no cyanosis or edema Wound: well healed sternotomy  Lab Results: Recent Labs    05/24/22 0413 05/25/22 0238  WBC 10.9* 10.3  HGB 9.6* 10.3*  HCT 30.5* 32.2*  PLT 356 393   BMET:  Recent Labs    05/25/22 0238 05/26/22 0111  NA 140 136  K 3.7 3.8  CL 101 97*  CO2 31 29  GLUCOSE 113* 117*  BUN 17 20  CREATININE 1.39* 1.32*  CALCIUM 8.5* 8.4*    PT/INR:  Recent Labs    05/26/22 0111  LABPROT 25.6*  INR 2.4*   ABG    Component Value Date/Time   PHART 7.368 04/08/2022 0229   HCO3 24.8 04/08/2022 0229   TCO2 26 04/08/2022 0229   ACIDBASEDEF 2.0 04/07/2022 0947   O2SAT 100 04/08/2022 0229   CBG (last 3)  No results for input(s): "GLUCAP" in the last 72 hours.  Assessment/Plan:  S/P Thoracentesis, for bilateral pleural effusions, CXR yesterday showed some re-accumulation on the right side CV- remains paced, on Amiodarone, Lopressor.. Cardiology monitoring, BNP is 216, she is responding well to diuretic, for limited ECHO today INR 2.4. home  regimen has been ordered Dispo- patient remains clinically stable, would continue diuretic daily, for Echo today, BNP is improving, patient is okay to d/c from our standpoint, she is scheduled for follow up with Dr. Cyndia Bent on Tuesday with repeat CXR   LOS: 2 days    Ellwood Handler, PA-C 05/26/2022

## 2022-05-26 NOTE — Progress Notes (Signed)
Progress Note  Patient Name: Patricia [name] Date of Encounter: 05/26/2022  Primary Cardiologist: Minus Breeding, MD   Subjective   Overnight stable creatinine, outs not recorded. Device interrogation performed. Patient feels fine.  Has persistent cough.  Was lost an additional 2.5 lbs since yesterday.  Inpatient Medications    Scheduled Meds:  amiodarone  100 mg Oral Daily   atorvastatin  20 mg Oral Daily   furosemide  60 mg Intravenous BID   levothyroxine  50 mcg Oral QAC breakfast   lipase/protease/amylase  36,000 Units Oral TID AC   metoprolol tartrate  25 mg Oral BID   pantoprazole  40 mg Oral Daily   potassium chloride  40 mEq Oral Daily   warfarin  2.5 mg Oral Q M,W,F-1800   warfarin  5 mg Oral Q T,Th,S,Su-1800   Warfarin - Physician Dosing Inpatient   Does not apply q1600   Continuous Infusions:  PRN Meds: lidocaine (PF), mouth rinse   Vital Signs    Vitals:   05/25/22 2112 05/25/22 2340 05/26/22 0401 05/26/22 0611  BP: 136/78 123/62 120/65   Pulse: (!) 108     Resp: (!) 24 (!) 22 18   Temp:  98.3 F (36.8 C) 97.9 F (36.6 C)   TempSrc:  Oral Oral   SpO2: 95% 94% 97%   Weight:    48.4 kg  Height:        Intake/Output Summary (Last 24 hours) at 05/26/2022 0735 Last data filed at 05/26/2022 9323 Gross per 24 hour  Intake 600 ml  Output 0 ml  Net 600 ml   Filed Weights   05/25/22 0500 05/26/22 5573  Weight: 49.4 kg 48.4 kg    Telemetry    AS-VP to AP-VP rate 85 briefly overnight - Personally Reviewed  Physical Exam   Gen: no distress, elderly female  Neck: Prominent JVD to the mid neck  Cardiac: No Rubs or Gallops, Loud P2 with RV Heave, tachycardia, +2 radial pulses Respiratory: Crackles bilaterally, normal effort, normal  respiratory rate GI: Soft, nontender, non-distended  MS: No edema;  moves all extremities Integument: Skin feels warm Neuro:  At time of evaluation, alert and oriented to person/place/time/situation  Psych: Normal  affect, patient feels better   Labs    Chemistry Recent Labs  Lab 05/24/22 0413 05/25/22 0238 05/26/22 0111  NA 139 140 136  K 3.6 3.7 3.8  CL 103 101 97*  CO2 '27 31 29  '$ GLUCOSE 115* 113* 117*  BUN '17 17 20  '$ CREATININE 1.23* 1.39* 1.32*  CALCIUM 8.8* 8.5* 8.4*  GFRNONAA 47* 41* 43*  ANIONGAP '9 8 10     '$ Hematology Recent Labs  Lab 05/23/22 0508 05/24/22 0413 05/25/22 0238  WBC 17.1* 10.9* 10.3  RBC 3.85* 3.52* 3.75*  HGB 10.6* 9.6* 10.3*  HCT 33.6* 30.5* 32.2*  MCV 87.3 86.6 85.9  MCH 27.5 27.3 27.5  MCHC 31.5 31.5 32.0  RDW 16.1* 16.0* 16.1*  PLT 393 356 393    Cardiac EnzymesNo results for input(s): "TROPONINI" in the last 168 hours. No results for input(s): "TROPIPOC" in the last 168 hours.   BNP Recent Labs  Lab 05/22/22 1659 05/26/22 0111  BNP 494.6* 216.0*     DDimer No results for input(s): "DDIMER" in the last 168 hours.   Radiology    DG Chest 2 View  Result Date: 05/25/2022 CLINICAL DATA:  Pleural effusion. EXAM: CHEST - 2 VIEW COMPARISON:  May 24, 2022. FINDINGS: Similar right and  increased left bibasilar opacities. Cardiomediastinal silhouette is similar. Left subclavian approach cardiac rhythm maintenance device. Median sternotomy. Prosthetic cardiac valve. No evidence of acute osseous abnormality. Polyarticular degenerative change. IMPRESSION: Similar right and increased left bibasilar opacities, probably a combination of layering pleural effusions and overlying atelectasis and/or consolidation. Electronically Signed   By: Margaretha Sheffield M.D.   On: 05/25/2022 07:54   IR THORACENTESIS ASP PLEURAL SPACE W/IMG GUIDE  Result Date: 05/24/2022 INDICATION: Heart valve replaced 04/08/22, pleural effusions, right side drained 05/23/22 EXAM: ULTRASOUND GUIDED LEFT THORACENTESIS MEDICATIONS: None. COMPLICATIONS: None immediate. PROCEDURE: An ultrasound guided thoracentesis was thoroughly discussed with the patient and questions answered. The benefits,  risks, alternatives and complications were also discussed. The patient understands and wishes to proceed with the procedure. Written consent was obtained. Ultrasound was performed to localize and mark an adequate pocket of fluid in the left chest. The area was then prepped and draped in the normal sterile fashion. 1% Lidocaine was used for local anesthesia. Under ultrasound guidance a 6 Fr Safe-T-Centesis catheter was introduced. Thoracentesis was performed. The catheter was removed and a dressing applied. FINDINGS: A total of approximately 568m of amber fluid was removed. IMPRESSION: Successful ultrasound guided left thoracentesis yielding 5571mof pleural fluid. Electronically Signed   By: JaCorrie Mckusick.O.   On: 05/24/2022 10:51   DG Chest 1 View  Result Date: 05/24/2022 CLINICAL DATA:  [id number] post left thoracentesis EXAM: CHEST  1 VIEW COMPARISON:  Chest x-ray from yesterday FINDINGS: Again seen are sternotomy wires, mediastinal surgical clips and mitral valve prosthesis. Left subclavian dual lead pacer is stable. There has been interval left thoracentesis. Mild residual pleural effusion. No pneumothorax. Moderate right pleural effusion. There is some atelectasis at the right mid-lower lung zone. The visualized skeletal structures are unremarkable. IMPRESSION: There has been interval left thoracentesis. No pneumothorax. Otherwise, there has been no significant interval change. Electronically Signed   By: AmFrazier Richards.D.   On: 05/24/2022 10:29      Patient Profile     [age].o. 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 (on atorvastatin 20 mg) - BNP has improved, but is still elevated, she still have significant crackles and JVD - will get echo today; if still evidence of volume overload would  keep one more day - if recurrent volume overload again we would consider euvolemic RHC - planned PO diuretic torsemide 40 mg PO daily  Long RP Tachycardia- suspect AT, briefly broke to APVP CHD s/p MDT PPM (Dependent) - device interrogation : base rate increased post AVR; AS VP, underlying CHB, one attempt at overdrive pacing did not terminate - will increase metoprolol 37.5 mg PO BID  -  Reviewed with EP, BB and amiodarone, has EP f/u  AFL s/p ablation with recurent PAF post 4/23 surgery - on amiodarone 100 mg PO daily   For questions or updates, please contact [facility] Please consult [email or web address] for contact info under Cardiology/STEMI.      MaRudean HaskellMD Cardiologist Hypertrophic [address]South [address] [place] [phone]:35 AM

## 2022-05-26 NOTE — TOC Progression Note (Signed)
Transition of Care [facility]) - Progression Note    Patient Details  Name: Patricia [name] MRN: [id number] Date of Birth: 27-Oct-1951  Transition of Care [facility]) CM/SW Contact  Zenon Mayo, RN Phone Number: 05/26/2022, 6:40 AM  Clinical Narrative:    Pl effusion,Thoracentesis done, Cards following, HR 120's,from home.  TOC will cont to follow for dc needs.        Expected Discharge Plan and Services                                                 Social Determinants of Health (SDOH) Interventions    Readmission Risk Interventions    04/14/2022    9:50 AM  Readmission Risk Prevention Plan  Transportation Screening Complete  PCP or Specialist Appt within 3-5 Days Complete  HRI or Home Care Consult Complete  Palliative Care Screening Not Applicable  Medication Review (RN Care Manager) Complete

## 2022-05-26 NOTE — Progress Notes (Signed)
PROGRESS NOTE  Patricia [name]  TKW:[id number] DOB: November 27, 1951 DOA: 05/22/2022 PCP: Susy Frizzle, MD   Brief Narrative:  Patient is a 71 year old female with history of complete heart block status post pacemaker, Hodgkin's lymphoma, breast cancer, recent bioprosthetic valve replacement on 4/23 who presented here with complaint of shortness of breath.  She has chronic bilateral pleural effusion and was supposed to have outpatient thoracentesis.  Patient underwent bilateral thoracentesis here. CVTS was following.  Cardiology also consulted.  Currently on IV Lasix for persistent volume overload, planning for echocardiogram.  Possible plan for discharge tomorrow.  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  Currently she is on room air.  On Lasix 80 mg IV twice daily which we will continue.  Cardiology planning for repeat echo.  Still looks volume overloaded  with elevated JVD  Bilateral pleural effusion: Chronic problem.  CVTS was following.  She underwent bilateral thoracentesis during this hospitalization.  Fluid was not sent for analysis.  Needs to undergo regular thoracentesis as an outpatient.  History of A-fib/SVT: Currently on metoprolol, amiodarone.  On Coumadin for anticoagulation.  Monitor on telemetry.  Monitor INR Has history of complete heart block status post pacemaker placement.  Plan for pacemaker interrogation.  History of pulmonary hypertension: Echo done on 05/17/2022 showed EF of 60 to 65%, regional wall motion abnormalities, moderately elevated pulmonary artery systolic pressure.  Cardiology planning for repeat echo.  Recent bioprosthetic  aortic valve replacement: Cardiology, CVTS following  Chronic kidney disease stage IIIa: Baseline creatinine ranges from 1.2-1.4.  Currently kidney function at baseline.  History of Hodgkin's lymphoma/breast cancer: Currently stable.  Status post radiation, chemotherapy.              DVT prophylaxis:SCDs Start: 05/22/22 2243 warfarin (COUMADIN) tablet 2.5 mg  warfarin (COUMADIN) tablet 5 mg     Code Status: Full Code  Family Communication: None at the bedside  Patient status:Inpatient  Patient is from :Home  Anticipated discharge JM:EQAS  Estimated DC date:tomorrow   Consultants: CVTS,cardiology  Procedures: Thoracentesis  Antimicrobials:  Anti-infectives (From admission, onward)    None       Subjective:  Patient seen and examined the bedside this morning.  Hemodynamically stable.  On room air.  She feels better today.  Denies any worsening shortness of breath or cough.   Objective: Vitals:   05/26/22 0401 05/26/22 0611 05/26/22 0750 05/26/22 1124  BP: 120/65  (!) 107/55 122/63  Pulse:   (!) 107 (!) 110  Resp: '18  19 17  '$ Temp: 97.9 F (36.6 C)  97.9 F (36.6 C) 98.3 F (36.8 C)  TempSrc: Oral  Oral   SpO2: 97%  96% 95%  Weight:  48.4 kg    Height:        Intake/Output Summary (Last 24 hours) at 05/26/2022 1134 Last data filed at 05/26/2022 0612 Gross per 24 hour  Intake 240 ml  Output 0 ml  Net 240 ml   Filed Weights   05/25/22 0500 05/26/22 0611  Weight: 49.4 kg 48.4 kg    Examination:  General exam: Overall comfortable, not in distress HEENT: PERRL, elevated JVD Respiratory system: Diminished air sounds on bases but  no clear wheezes or crackles Cardiovascular system: Paced rhythm, tachycardia Gastrointestinal system: Abdomen is nondistended, soft and nontender. Central nervous system: Alert and oriented Extremities: No edema, no clubbing ,no cyanosis Skin: No rashes, no ulcers,no icterus      Data Reviewed: I have personally  reviewed following labs and imaging studies  CBC: Recent Labs  Lab 05/22/22 1659 05/23/22 0508 05/24/22 0413 05/25/22 0238  WBC 18.6* 17.1* 10.9* 10.3  HGB 11.5* 10.6* 9.6* 10.3*  HCT 37.1 33.6* 30.5* 32.2*  MCV 87.3 87.3 86.6 85.9  PLT 446* 393 356 962   Basic Metabolic Panel: Recent Labs  Lab 05/22/22 1659 05/23/22 0508 05/24/22 0413 05/25/22 0238 05/26/22 0111  NA 139 142 139 140 136  K 4.1 3.7 3.6 3.7 3.8  CL 98 102 103 101 97*  CO2 '30 30 27 31 29  '$ GLUCOSE 116* 103* 115* 113* 117*  BUN '23 19 17 17 20  '$ CREATININE 1.52* 1.32* 1.23* 1.39* 1.32*  CALCIUM 9.2 9.1 8.8* 8.5* 8.4*     No results found for this or any previous visit (from the past 240 hour(s)).   Radiology Studies: DG Chest 2 View  Result Date: 05/25/2022 CLINICAL DATA:  Pleural effusion. EXAM: CHEST - 2 VIEW COMPARISON:  May 24, 2022. FINDINGS: Similar right and increased left bibasilar opacities. Cardiomediastinal silhouette is similar. Left subclavian approach cardiac rhythm maintenance device. Median sternotomy. Prosthetic cardiac valve. No evidence of acute osseous abnormality. Polyarticular degenerative change. IMPRESSION: Similar right and increased left bibasilar opacities, probably a combination of layering pleural effusions and overlying atelectasis and/or consolidation. Electronically Signed   By: Margaretha Sheffield M.D.   On: 05/25/2022 07:54    Scheduled Meds:  amiodarone  100 mg Oral Daily   atorvastatin  20 mg Oral Daily   furosemide  80 mg Intravenous BID   levothyroxine  50 mcg Oral QAC breakfast   lipase/protease/amylase  36,000 Units Oral TID AC   metoprolol tartrate  37.5 mg Oral BID   pantoprazole  40 mg Oral Daily   potassium chloride  40 mEq Oral Daily   warfarin  2.5 mg Oral Q M,W,F-1800   warfarin  5 mg Oral Q T,Th,S,Su-1800   Warfarin - Physician Dosing Inpatient   Does not apply q1600   Continuous Infusions:   LOS: 2 days   Shelly Coss, MD [facility] P6/15/2023, 11:34 AM

## 2022-05-27 ENCOUNTER — Ambulatory Visit: Payer: Medicare PPO

## 2022-05-27 DIAGNOSIS — J90 Pleural effusion, not elsewhere classified: Secondary | ICD-10-CM | POA: Diagnosis not present

## 2022-05-27 LAB — BASIC METABOLIC PANEL
Anion gap: 13 (ref 5–15)
BUN: 26 mg/dL — ABNORMAL HIGH (ref 8–23)
CO2: 27 mmol/L (ref 22–32)
Calcium: 9 mg/dL (ref 8.9–10.3)
Chloride: 98 mmol/L (ref 98–111)
Creatinine, Ser: 1.47 mg/dL — ABNORMAL HIGH (ref 0.44–1.00)
GFR, Estimated: 38 mL/min — ABNORMAL LOW (ref >60)
Glucose, Bld: 124 mg/dL — ABNORMAL HIGH (ref 70–99)
Potassium: 4.7 mmol/L (ref 3.5–5.1)
Sodium: 138 mmol/L (ref 135–145)

## 2022-05-27 LAB — PROTIME-INR
INR: 2.1 — ABNORMAL HIGH (ref 0.8–1.2)
Prothrombin Time: 23 seconds — ABNORMAL HIGH (ref 11.4–15.2)

## 2022-05-27 MED ORDER — METOPROLOL TARTRATE 50 MG PO TABS
50.0000 mg | ORAL_TABLET | Freq: Two times a day (BID) | ORAL | Status: DC
  Administered 2022-05-27 – 2022-05-28 (×3): 50 mg via ORAL
  Filled 2022-05-27 (×3): qty 1

## 2022-05-27 NOTE — Plan of Care (Signed)
  Problem: Education: Goal: Knowledge of General Education information will improve Description: Including pain rating scale, medication(s)/side effects and non-pharmacologic comfort measures Outcome: Progressing   Problem: Health Behavior/Discharge Planning: Goal: Ability to manage health-related needs will improve Outcome: Progressing   Problem: Clinical Measurements: Goal: Diagnostic test results will improve Outcome: Progressing Goal: Respiratory complications will improve Outcome: Progressing   Problem: Activity: Goal: Risk for activity intolerance will decrease Outcome: Progressing

## 2022-05-27 NOTE — Progress Notes (Signed)
PROGRESS NOTE  Patricia [name]  FVC:[id number] DOB: February 06, 1951 DOA: 05/22/2022 PCP: Patricia Frizzle, MD   Brief Narrative:  Patient is a 71 year old female with history of complete heart block status post pacemaker, Hodgkin's lymphoma, breast cancer, recent bioprosthetic valve replacement on 4/23 who presented here with complaint of shortness of breath.  She has chronic bilateral pleural effusion and was supposed to have outpatient thoracentesis.  Patient underwent bilateral thoracentesis here. CVTS was following.  Cardiology also consulted.  Currently on IV Lasix for persistent volume overload.  Possible plan for discharge tomorrow.  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  Currently she is on room air.  She was Lasix 80 mg IV twice daily, plan to transition to oral tomorrow.  Repeat echo done here showed EF of 55 to 60%, indeterminate diastolic dysfunction, severe pulmonary stenosis, moderate pulmonary artery hypertension.  Lungs are clear on auscultation today.  No peripheral edema  Bilateral pleural effusion: Chronic problem.  CVTS was following.  She underwent bilateral thoracentesis during this hospitalization.  Fluid was not sent for analysis.  Needs to undergo regular thoracentesis as an outpatient.  History of A-fib/SVT: Currently on metoprolol, amiodarone.  On Coumadin for anticoagulation.  Monitor on telemetry.  Monitor INR Has history of complete heart block status post pacemaker placement.  Plan for pacemaker interrogation.  History of pulmonary hypertension: Echo done on 05/17/2022 showed EF of 60 to 65%, regional wall motion abnormalities,  moderately elevated pulmonary artery systolic pressure.  Repeat echo as above  Recent bioprosthetic aortic valve replacement: Cardiology, CVTS following  Chronic kidney disease stage IIIa: Baseline creatinine ranges from 1.2-1.4.  Currently kidney function at baseline.  History of Hodgkin's lymphoma/breast cancer: Currently stable.  Status post radiation, chemotherapy.              DVT prophylaxis:SCDs Start: 05/22/22 2243 warfarin (COUMADIN) tablet 2.5 mg  warfarin (COUMADIN) tablet 5 mg     Code Status: Full Code  Family Communication: None at the bedside  Patient status:Inpatient  Patient is from :Home  Anticipated discharge MB:WGYK  Estimated DC date:tomorrow   Consultants: CVTS,cardiology  Procedures: Thoracentesis  Antimicrobials:  Anti-infectives (From admission, onward)    None       Subjective:  Patient seen and examined at the bedside this morning.  Hemodynamically stable without any complaints.  No shortness of breath or cough.  Saturating fine on room air.  Tachycardia improved  Objective: Vitals:   05/26/22 2119 05/26/22 2313 05/27/22 0423 05/27/22 0905  BP: 118/66 138/77 120/71 (!) 111/59  Pulse:    (!) 101  Resp:  18 14 (!) 22  Temp:  98.7 F (37.1 C) 98.3 F (36.8 C)   TempSrc:  Oral Oral   SpO2:  95% 96% 95%  Weight:   47.7 kg   Height:        Intake/Output Summary (Last 24 hours) at 05/27/2022 1114 Last data filed at 05/27/2022 0917 Gross per 24 hour  Intake 922 ml  Output 1450 ml  Net -528 ml   Filed Weights   05/25/22 0500 05/26/22 0611 05/27/22 0423  Weight: 49.4 kg 48.4 kg 47.7 kg    Examination:  General  exam: Overall comfortable, not in distress, pleasant female HEENT: PERRL Respiratory system:  no wheezes or crackles  Cardiovascular system: Paced rhythm  gastrointestinal system: Abdomen is nondistended, soft and nontender. Central nervous system: Alert and oriented Extremities: No edema, no clubbing ,no  cyanosis Skin: No rashes, no ulcers,no icterus      Data Reviewed: I have personally reviewed following labs and imaging studies  CBC: Recent Labs  Lab 05/22/22 1659 05/23/22 0508 05/24/22 0413 05/25/22 0238  WBC 18.6* 17.1* 10.9* 10.3  HGB 11.5* 10.6* 9.6* 10.3*  HCT 37.1 33.6* 30.5* 32.2*  MCV 87.3 87.3 86.6 85.9  PLT 446* 393 356 177   Basic Metabolic Panel: Recent Labs  Lab 05/23/22 0508 05/24/22 0413 05/25/22 0238 05/26/22 0111 05/27/22 0141  NA 142 139 140 136 138  K 3.7 3.6 3.7 3.8 4.7  CL 102 103 101 97* 98  CO2 '30 27 31 29 27  '$ GLUCOSE 103* 115* 113* 117* 124*  BUN '19 17 17 20 '$ 26*  CREATININE 1.32* 1.23* 1.39* 1.32* 1.47*  CALCIUM 9.1 8.8* 8.5* 8.4* 9.0     No results found for this or any previous visit (from the past 240 hour(s)).   Radiology Studies: ECHOCARDIOGRAM LIMITED  Result Date: 05/26/2022    ECHOCARDIOGRAM LIMITED REPORT   Patient Name:   Patricia [name] Date of Exam: 05/26/2022 Medical Rec #:  [id number]        Height:       65.0 in Accession #:    [id number]       Weight:       106.7 lb Date of Birth:  11-Nov-1951       BSA:          1.515 m Patient Age:    71 years         BP:           102/55 mmHg Patient Gender: F                HR:           112 bpm. Exam Location:  Inpatient Procedure: Limited Echo, Cardiac Doppler, Color Doppler and Intracardiac            Opacification Agent Indications:    R06.02 SOB  History:        Patient has prior history of Echocardiogram examinations, most                 recent 05/17/2022. Abnormal ECG and Pacemaker, Pulmonary HTN and                 COPD, Aortic Valve Disease and Mitral Valve Disease,                 Arrythmias:Tachycardia and Atrial Flutter;                 Signs/Symptoms:Dyspnea and Syncope. Cancer.                 Aortic Valve: 19 mm Inspiris Resilia valve is present in the                 aortic position. Procedure Date: 2023.                 Mitral Valve: 25 mm Medtronic bioprosthetic valve valve is                  present in the mitral position. Procedure Date: 2019.  Sonographer:    Roseanna Rainbow  RDCS Referring Phys: [id number] [facility]  Sonographer Comments: Technically difficult study due to poor echo windows. Limited echo to evaluate valves and TDI. IMPRESSIONS  1. Left ventricular ejection fraction, by estimation, is 55 to 60%. The left ventricle has normal function. Left ventricular diastolic function could not be evaluated.  2. Functional progressive mitral stenosis. The mean mitral valve gradient is 7.0 mmHg. There is a 25 mm Medtronic bioprosthetic valve present in the mitral position. Procedure Date: 2019.  3. Tricuspid valve regurgitation is mild to moderate.  4. The aortic valve has been repaired/replaced. Aortic valve regurgitation is trivial. No aortic stenosis is present. There is a 19 mm Inspiris Resilia valve present in the aortic position. Procedure Date: 2023.  5. Pulmonic valve regurgitation is moderate to severe. Severe pulmonic stenosis.  6. There is mildly elevated pulmonary artery systolic pressure.  7. The inferior vena cava is dilated in size with >50% respiratory variability, suggesting right atrial pressure of 8 mmHg. FINDINGS  Left Ventricle: Left ventricular ejection fraction, by estimation, is 55 to 60%. The left ventricle has normal function. Definity contrast agent was given IV to delineate the left ventricular endocardial borders. There is no left ventricular hypertrophy. Abnormal (paradoxical) septal motion consistent with post-operative status. Left ventricular diastolic function could not be evaluated. Left ventricular diastolic function could not be evaluated due to mitral valve replacement. Right Ventricle: There is mildly elevated pulmonary artery systolic pressure. The tricuspid regurgitant velocity is 2.85 m/s, and with an assumed right atrial pressure of 8 mmHg, the estimated right ventricular systolic pressure is 76.7 mmHg. Mitral Valve: There is a 25 mm  Medtronic bioprosthetic valve present in the mitral position. Procedure Date: 2019. Functional progressive mitral valve stenosis. MV peak gradient, 14.2 mmHg. The mean mitral valve gradient is 7.0 mmHg. Tricuspid Valve: Tricuspid valve regurgitation is mild to moderate. Aortic Valve: The aortic valve has been repaired/replaced. Aortic valve regurgitation is trivial. No aortic stenosis is present. Aortic valve mean gradient measures 4.7 mmHg. Aortic valve peak gradient measures 8.2 mmHg. Aortic valve area, by VTI measures 2.20 cm. There is a 19 mm Inspiris Resilia valve present in the aortic position. Procedure Date: 2023. Pulmonic Valve: Pulmonic valve regurgitation is moderate to severe. Severe pulmonic stenosis. Venous: The inferior vena cava is dilated in size with greater than 50% respiratory variability, suggesting right atrial pressure of 8 mmHg. Additional Comments: Moderate ascites is present. LEFT VENTRICLE PLAX 2D LVIDd:         3.40 cm   Diastology LVIDs:         2.40 cm   LV e' medial:    5.11 cm/s LV PW:         0.90 cm   LV E/e' medial:  36.8 LV IVS:        0.90 cm   LV e' lateral:   6.53 cm/s LVOT diam:     1.95 cm   LV E/e' lateral: 28.8 LV SV:         48 LV SV Index:   32 LVOT Area:     2.99 cm  RIGHT VENTRICLE            IVC RV S prime:     4.88 cm/s  IVC diam: 1.80 cm RVOT diam:      2.00 cm AORTIC VALVE                    PULMONIC VALVE AV Area (Vmax):    2.38 cm  PR End Diast Vel: 2.61 msec AV Area (Vmean):   2.26 cm AV Area (VTI):     2.20 cm AV Vmax:           143.00 cm/s AV Vmean:          99.233 cm/s AV VTI:            0.220 m AV Peak Grad:      8.2 mmHg AV Mean Grad:      4.7 mmHg LVOT Vmax:         113.75 cm/s LVOT Vmean:        75.025 cm/s LVOT VTI:          0.162 m LVOT/AV VTI ratio: 0.74  AORTA Ao Root diam: 2.30 cm Ao Asc diam:  2.50 cm MITRAL VALVE                TRICUSPID VALVE MV Area (PHT): 5.27 cm     TR Peak grad:   32.5 mmHg MV Area VTI:   2.04 cm     TR Vmax:         285.00 cm/s MV Peak grad:  14.2 mmHg MV Mean grad:  7.0 mmHg     SHUNTS MV Vmax:       1.88 m/s     Systemic VTI:  0.16 m MV Vmean:      120.7 cm/s   Systemic Diam: 1.95 cm MV Decel Time: 144 msec     Pulmonic Diam: 2.00 cm MV E velocity: 188.00 cm/s Kardie Tobb DO Electronically signed by Berniece Salines DO Signature Date/Time: 05/26/2022/6:25:13 PM    Final     Scheduled Meds:  amiodarone  100 mg Oral Daily   atorvastatin  20 mg Oral Daily   levothyroxine  50 mcg Oral QAC breakfast   lipase/protease/amylase  36,000 Units Oral TID AC   metoprolol tartrate  50 mg Oral BID   pantoprazole  40 mg Oral Daily   potassium chloride  40 mEq Oral BID   warfarin  2.5 mg Oral Q M,W,F-1800   warfarin  5 mg Oral Q T,Th,S,Su-1800   Warfarin - Physician Dosing Inpatient   Does not apply q1600   Continuous Infusions:   LOS: 3 days   Shelly Coss, MD [facility] P6/16/2023, 11:14 AM

## 2022-05-27 NOTE — Progress Notes (Signed)
Progress Note  Patient Name: Patricia [name] Date of Encounter: 05/27/2022  Primary Cardiologist: Minus Breeding, MD   Subjective   Creatinine has increased: 1.47 today within range of admission (1.2-1.52) and since recent surgery (0.8- .79) Heart rates better controlled Patient feels fine.  No CP, SOB or palpitations.  Inpatient Medications    Scheduled Meds:  amiodarone  100 mg Oral Daily   atorvastatin  20 mg Oral Daily   levothyroxine  50 mcg Oral QAC breakfast   lipase/protease/amylase  36,000 Units Oral TID AC   metoprolol tartrate  37.5 mg Oral BID   pantoprazole  40 mg Oral Daily   potassium chloride  40 mEq Oral BID   warfarin  2.5 mg Oral Q M,W,F-1800   warfarin  5 mg Oral Q T,Th,S,Su-1800   Warfarin - Physician Dosing Inpatient   Does not apply q1600   Continuous Infusions:  furosemide Stopped (05/26/22 1733)   PRN Meds: lidocaine (PF), mouth rinse   Vital Signs    Vitals:   05/26/22 2006 05/26/22 2119 05/26/22 2313 05/27/22 0423  BP: 99/60 118/66 138/77 120/71  Pulse: (!) 105     Resp: '18  18 14  '$ Temp: 98.4 F (36.9 C)  98.7 F (37.1 C) 98.3 F (36.8 C)  TempSrc: Oral  Oral Oral  SpO2: 95%  95% 96%  Weight:    47.7 kg  Height:        Intake/Output Summary (Last 24 hours) at 05/27/2022 0748 Last data filed at 05/27/2022 0616 Gross per 24 hour  Intake 682 ml  Output 1450 ml  Net -768 ml   Filed Weights   05/25/22 0500 05/26/22 0611 05/27/22 0423  Weight: 49.4 kg 48.4 kg 47.7 kg    Telemetry    AS-VP tachycardia 105 with little variation - Personally Reviewed  Physical Exam   Gen: no distress, elderly female  Neck: Prominent JVD to the mid neck, improved from 05/28/22 Cardiac: No Rubs or Gallops, Loud P2 with RV Heave, tachycardia, +2 radial pulses Respiratory: Crackles bilaterally, normal effort, normal  respiratory rate GI: Soft, nontender, non-distended  MS: No edema;  moves all extremities Integument: Skin feels warm Neuro:   At time of evaluation, alert and oriented to person/place/time/situation  Psych: Normal affect, patient feels better   Labs    Chemistry Recent Labs  Lab 05/25/22 0238 05/26/22 0111 05/27/22 0141  NA 140 136 138  K 3.7 3.8 4.7  CL 101 97* 98  CO2 '31 29 27  '$ GLUCOSE 113* 117* 124*  BUN 17 20 26*  CREATININE 1.39* 1.32* 1.47*  CALCIUM 8.5* 8.4* 9.0  GFRNONAA 41* 43* 38*  ANIONGAP '8 10 13     '$ Hematology Recent Labs  Lab 05/23/22 0508 05/24/22 0413 05/25/22 0238  WBC 17.1* 10.9* 10.3  RBC 3.85* 3.52* 3.75*  HGB 10.6* 9.6* 10.3*  HCT 33.6* 30.5* 32.2*  MCV 87.3 86.6 85.9  MCH 27.5 27.3 27.5  MCHC 31.5 31.5 32.0  RDW 16.1* 16.0* 16.1*  PLT 393 356 393    Cardiac EnzymesNo results for input(s): "TROPONINI" in the last 168 hours. No results for input(s): "TROPIPOC" in the last 168 hours.   BNP Recent Labs  Lab 05/22/22 1659 05/26/22 0111  BNP 494.6* 216.0*     DDimer No results for input(s): "DDIMER" in the last 168 hours.   Radiology    ECHOCARDIOGRAM LIMITED  Result Date: 05/26/2022    ECHOCARDIOGRAM LIMITED REPORT   Patient Name:   Patricia [name]  N Patricia [name] Date of Exam: 05/26/2022 Medical Rec #:  [id number]        Height:       65.0 in Accession #:    [id number]       Weight:       106.7 lb Date of Birth:  [birth year]       BSA:          1.515 m Patient Age:    71 years         BP:           102/55 mmHg Patient Gender: F                HR:           112 bpm. Exam Location:  Inpatient Procedure: Limited Echo, Cardiac Doppler, Color Doppler and Intracardiac            Opacification Agent Indications:    R06.02 SOB  History:        Patient has prior history of Echocardiogram examinations, most                 recent 05/17/2022. Abnormal ECG and Pacemaker, Pulmonary HTN and                 COPD, Aortic Valve Disease and Mitral Valve Disease,                 Arrythmias:Tachycardia and Atrial Flutter;                 Signs/Symptoms:Dyspnea and Syncope. Cancer.                 Aortic  Valve: 19 mm Inspiris Resilia valve is present in the                 aortic position. Procedure Date: 2023.                 Mitral Valve: 25 mm Medtronic bioprosthetic valve valve is                 present in the mitral position. Procedure Date: 2019.  Sonographer:    Patricia [name] RDCS Referring Phys: [id number] [facility] [place] [name]  Sonographer Comments: Technically difficult study due to poor echo windows. Limited echo to evaluate valves and TDI. IMPRESSIONS  1. Left ventricular ejection fraction, by estimation, is 55 to 60%. The left ventricle has normal function. Left ventricular diastolic function could not be evaluated.  2. Functional progressive mitral stenosis. The mean mitral valve gradient is 7.0 mmHg. There is a 25 mm Medtronic bioprosthetic valve present in the mitral position. Procedure Date: 2019.  3. Tricuspid valve regurgitation is mild to moderate.  4. The aortic valve has been repaired/replaced. Aortic valve regurgitation is trivial. No aortic stenosis is present. There is a 19 mm Inspiris Resilia valve present in the aortic position. Procedure Date: 2023.  5. Pulmonic valve regurgitation is moderate to severe. Severe pulmonic stenosis.  6. There is mildly elevated pulmonary artery systolic pressure.  7. The inferior vena cava is dilated in size with >50% respiratory variability, suggesting right atrial pressure of 8 mmHg. FINDINGS  Left Ventricle: Left ventricular ejection fraction, by estimation, is 55 to 60%. The left ventricle has normal function. Definity contrast agent was given IV to delineate the left ventricular endocardial borders. There is no left ventricular hypertrophy. Abnormal (paradoxical) septal motion consistent with post-operative status. Left ventricular diastolic function could not be evaluated. Left ventricular diastolic function  could not be evaluated due to mitral valve replacement. Right Ventricle: There is mildly elevated pulmonary artery systolic pressure. The tricuspid  regurgitant velocity is 2.85 m/s, and with an assumed right atrial pressure of 8 mmHg, the estimated right ventricular systolic pressure is 63.1 mmHg. Mitral Valve: There is a 25 mm Medtronic bioprosthetic valve present in the mitral position. Procedure Date: 2019. Functional progressive mitral valve stenosis. MV peak gradient, 14.2 mmHg. The mean mitral valve gradient is 7.0 mmHg. Tricuspid Valve: Tricuspid valve regurgitation is mild to moderate. Aortic Valve: The aortic valve has been repaired/replaced. Aortic valve regurgitation is trivial. No aortic stenosis is present. Aortic valve mean gradient measures 4.7 mmHg. Aortic valve peak gradient measures 8.2 mmHg. Aortic valve area, by VTI measures 2.20 cm. There is a 19 mm Inspiris Resilia valve present in the aortic position. Procedure Date: 2023. Pulmonic Valve: Pulmonic valve regurgitation is moderate to severe. Severe pulmonic stenosis. Venous: The inferior vena cava is dilated in size with greater than 50% respiratory variability, suggesting right atrial pressure of 8 mmHg. Additional Comments: Moderate ascites is present. LEFT VENTRICLE PLAX 2D LVIDd:         3.40 cm   Diastology LVIDs:         2.40 cm   LV e' medial:    5.11 cm/s LV PW:         0.90 cm   LV E/e' medial:  36.8 LV IVS:        0.90 cm   LV e' lateral:   6.53 cm/s LVOT diam:     1.95 cm   LV E/e' lateral: 28.8 LV SV:         48 LV SV Index:   32 LVOT Area:     2.99 cm  RIGHT VENTRICLE            IVC RV S prime:     4.88 cm/s  IVC diam: 1.80 cm RVOT diam:      2.00 cm AORTIC VALVE                    PULMONIC VALVE AV Area (Vmax):    2.38 cm     PR End Diast Vel: 2.61 msec AV Area (Vmean):   2.26 cm AV Area (VTI):     2.20 cm AV Vmax:           143.00 cm/s AV Vmean:          99.233 cm/s AV VTI:            0.220 m AV Peak Grad:      8.2 mmHg AV Mean Grad:      4.7 mmHg LVOT Vmax:         113.75 cm/s LVOT Vmean:        75.025 cm/s LVOT VTI:          0.162 m LVOT/AV VTI ratio: 0.74  AORTA Ao  Root diam: 2.30 cm Ao Asc diam:  2.50 cm MITRAL VALVE                TRICUSPID VALVE MV Area (PHT): 5.27 cm     TR Peak grad:   32.5 mmHg MV Area VTI:   2.04 cm     TR Vmax:        285.00 cm/s MV Peak grad:  14.2 mmHg MV Mean grad:  7.0 mmHg     SHUNTS MV Vmax:       1.88 m/s  Systemic VTI:  0.16 m MV Vmean:      120.7 cm/s   Systemic Diam: 1.95 cm MV Decel Time: 144 msec     Pulmonic Diam: 2.00 cm MV E velocity: 188.00 cm/s Kardie Tobb DO Electronically signed by Berniece Salines DO Signature Date/Time: 05/26/2022/6:25:13 PM    Final       Patient Profile     71 y.o. 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 (on atorvastatin 20 mg) - despite AKI would recommend one more day of IV diuretic this AM then transition to PO; in shared decision making with patient we will plan for discharge 05/28/22 if agreeable with our TRH and TCTS colleauges - BNP again for 05/28/22 - will increase metoprolol to 50 mg PO BID; I suspect MS gradients are higher from prior in the setting of fast heart rates - as outpatient Cardiac MRI for PI would be reasonable - planned PO diuretic torsemide 40 mg PO daily  Long RP Tachycardia- suspect AT, briefly broke to APVP CHD s/p MDT PPM (Dependent) - BB as above - has EP f/u  AFL s/p ablation with recurent PAF post 4/23 surgery - on amiodarone 100 mg PO daily   For questions or updates, please contact [facility] Please consult [email or web address] for contact info under Cardiology/STEMI.      Rudean Haskell, MD Cardiologist [facility] [address], [address] [place], [place] [address] [phone]  7:48 AM

## 2022-05-27 NOTE — Care Management Important Message (Signed)
Important Message  Patient Details  Name: Patricia [name] MRN: [id number] Date of Birth: 1951/03/13   Medicare Important Message Given:  Yes     Gwyneth Fernandez Montine Circle 05/27/2022, 1:48 PM

## 2022-05-28 DIAGNOSIS — J90 Pleural effusion, not elsewhere classified: Secondary | ICD-10-CM | POA: Diagnosis not present

## 2022-05-28 LAB — PROTIME-INR
INR: 2.1 — ABNORMAL HIGH (ref 0.8–1.2)
Prothrombin Time: 23.3 seconds — ABNORMAL HIGH (ref 11.4–15.2)

## 2022-05-28 LAB — BASIC METABOLIC PANEL
Anion gap: 11 (ref 5–15)
BUN: 33 mg/dL — ABNORMAL HIGH (ref 8–23)
CO2: 27 mmol/L (ref 22–32)
Calcium: 8.7 mg/dL — ABNORMAL LOW (ref 8.9–10.3)
Chloride: 95 mmol/L — ABNORMAL LOW (ref 98–111)
Creatinine, Ser: 1.77 mg/dL — ABNORMAL HIGH (ref 0.44–1.00)
GFR, Estimated: 30 mL/min — ABNORMAL LOW (ref >60)
Glucose, Bld: 126 mg/dL — ABNORMAL HIGH (ref 70–99)
Potassium: 4.6 mmol/L (ref 3.5–5.1)
Sodium: 133 mmol/L — ABNORMAL LOW (ref 135–145)

## 2022-05-28 MED ORDER — TORSEMIDE 40 MG PO TABS: 40.0000 mg | ORAL_TABLET | Freq: Every day | ORAL | 1 refills | Status: DC

## 2022-05-28 MED ORDER — METOPROLOL TARTRATE 50 MG PO TABS: 50.0000 mg | ORAL_TABLET | Freq: Two times a day (BID) | ORAL | 1 refills | Status: DC

## 2022-05-28 MED ORDER — POTASSIUM CHLORIDE 20 MEQ PO PACK: 20.0000 meq | PACK | Freq: Every day | ORAL | 0 refills | Status: DC

## 2022-05-28 NOTE — Discharge Summary (Signed)
Physician Discharge Summary  Patricia [name] RJJ:[id number] DOB: 1951-12-02 DOA: 05/22/2022  PCP: Susy Frizzle, MD  Admit date: 05/22/2022 Discharge date: 05/28/2022  Admitted From: Home Disposition:  Home  Discharge Condition:Stable CODE STATUS:FULL Diet recommendation: Heart Healthy  Brief/Interim Summary:  Patient is a 71 year old female with history of complete heart block status post pacemaker, Hodgkin's lymphoma, breast cancer, recent bioprosthetic valve replacement on 4/23 who presented here with complaint of shortness of breath.  She has chronic bilateral pleural effusion and was supposed to have outpatient thoracentesis.  Patient underwent bilateral thoracentesis here. CVTS was following.  Cardiology also consulted. She was started on IV Lasix for persistent volume overload.  Now almost euvolemic.  Cardiology cleared for discharge.  Following problems were addressed during her hospitalization:  Dyspnea: Secondary to bilateral pleural effusion, volume overload.  Currently she is on room air.  She was Lasix 80 mg IV twice daily, plan to transition to oral tomorrow with torsemide.Repeat echo done here showed EF of 55 to 60%, indeterminate diastolic dysfunction, severe pulmonary stenosis, moderate pulmonary artery hypertension.  Lungs are clear on auscultation today.  No peripheral edema   Bilateral pleural effusion: Chronic problem.  CVTS was following.  She underwent bilateral thoracentesis during this hospitalization.  Fluid was not sent for analysis.  Needs to undergo regular thoracentesis as an outpatient.  She will follow-up with CVTS as an outpatient   History of A-fib/SVT: Currently on metoprolol, amiodarone.  On Coumadin for anticoagulation.  Monitor on telemetry.  Monitor INR Has history of complete heart block status post pacemaker placement. Underwent  pacemaker interrogation.  She follows with EP   History of pulmonary hypertension: Echo done on 05/17/2022 showed EF of  60 to 65%, regional wall motion abnormalities, moderately elevated pulmonary artery systolic pressure.  Repeat echo as above   Recent bioprosthetic aortic valve replacement: Cardiology, CVTS following   Chronic kidney disease stage IIIa: Baseline creatinine ranges from 1.2-1.4.  Currently kidney function slightly worse than baseline, needs to hold diuretics today, can restart torsemide tomorrow.  Check BMP in a week   History of Hodgkin's lymphoma/breast cancer: Currently stable.  Status post radiation, chemotherapy.   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   HTN (hypertension)   Pulmonary HTN ([facility])   Long term (current) use of anticoagulants [Z79.01]   Atrial flutter (H[facility])   S/P aortic valve replacement    Discharge Instructions  Discharge Instructions     Diet - low sodium heart healthy   Complete by: As directed    Discharge instructions   Complete by: As directed    1)Please take prescribed medications as instructed 2)Follow up with your PCP in a week to do a BMP test to check your kidney function and potassium level 3)Follow up with cardiothoracic surgery and cardiology as an outpatient   Increase activity slowly   Complete by: As directed       Allergies as of 05/28/2022       Reactions   Aspirin Other (See Comments)   Past bleeding ulcer. GI upset   Azithromycin Other (See Comments)   pancreatitis   Penicillins Anaphylaxis, Hives, Swelling, Other (See Comments)   PATIENT HAS HAD A PCN REACTION WITH IMMEDIATE RASH, FACIAL/TONGUE/THROAT SWELLING, SOB, OR LIGHTHEADEDNESS WITH HYPOTENSION:  #  #  #  YES  #  #  #   HAS PT DEVELOPED SEVERE RASH INVOLVING MUCUS MEMBRANES or  SKIN NECROSIS: #  #  #  YES  #  #  #  Has patient had a PCN reaction that required hospitalization: No Has patient had a PCN reaction occurring within the last 10 years: No   Sulfa  Antibiotics Hives, Rash   Cheese Nausea And Vomiting        Medication List     STOP taking these medications    furosemide 40 MG tablet Commonly known as: LASIX       TAKE these medications    Accu-Chek Guide test strip Generic drug: glucose blood USE TO TEST BLOOD SUGARS EVERY MORNING   Accu-Chek Guide w/Device Kit 1 Units by Does not apply route daily.   accu-chek soft touch lancets Check BS QAM   Accu-Chek Softclix Lancet Dev Kit Check BS QAM   acetaminophen 325 MG tablet Commonly known as: TYLENOL Take 2 tablets (650 mg total) by mouth 2 (two) times daily as needed for moderate pain or headache.   alendronate 70 MG tablet Commonly known as: FOSAMAX Take 1 tablet (70 mg total) by mouth once a week. What changed: additional instructions   amiodarone 200 MG tablet Commonly known as: PACERONE Take 0.5 tablets (100 mg total) by mouth daily.   atorvastatin 20 MG tablet Commonly known as: LIPITOR Take 1 tablet (20 mg total) by mouth daily.   CALCIUM 600 PO Take 600 mg by mouth 2 (two) times daily. Lunch and Dinner   Creon 36000 UNITS Cpep capsule Generic drug: lipase/protease/amylase Take 36,000 Units by mouth 3 (three) times daily before meals.   esomeprazole 40 MG capsule Commonly known as: NEXIUM Take 40 mg by mouth daily before breakfast.   fluticasone 50 MCG/ACT nasal spray Commonly known as: FLONASE Place 2 sprays into both nostrils daily. What changed:  when to take this reasons to take this   levothyroxine 50 MCG tablet Commonly known as: SYNTHROID TAKE 1 TABLET BY MOUTH ONCE A DAY What changed: when to take this   metoprolol tartrate 50 MG tablet Commonly known as: LOPRESSOR Take 1 tablet (50 mg total) by mouth 2 (two) times daily. What changed:  medication strength how much to take   multivitamin with minerals Tabs tablet Take 1 tablet by mouth daily with lunch. Centrum Silver.   potassium chloride 20 MEQ packet Commonly known  as: KLOR-CON Take 20 mEq by mouth daily.   Torsemide 40 MG Tabs Take 40 mg by mouth daily. Start taking on: May 29, 2022   traMADol 50 MG tablet Commonly known as: ULTRAM Take 1 tablet (50 mg total) by mouth every 6 (six) hours as needed for moderate pain.   warfarin 5 MG tablet Commonly known as: Coumadin Take as directed. If you are unsure how to take this medication, talk to your nurse or doctor. Original instructions: Take 1 tablet (5 mg total) by mouth daily. What changed:  how much to take when to take this additional instructions        Follow-up Information     [name], MD Follow up on 05/31/2022.   Specialty: Cardiothoracic Surgery Why: Appointment is at 1:00, please get CXR at 12:30 at [facility] located on first floor of our office building Contact information: [place] [address] [phone]         Susy Frizzle, MD. Schedule an appointment as soon as possible for a visit in 1 week(s).   Specialty: Family Medicine Contact information: [address]  [place] [address] [phone]                Allergies  Allergen Reactions   Aspirin Other (See Comments)    Past bleeding ulcer. GI upset   Azithromycin Other (See Comments)    pancreatitis   Penicillins Anaphylaxis, Hives, Swelling and Other (See Comments)    PATIENT HAS HAD A PCN REACTION WITH IMMEDIATE RASH, FACIAL/TONGUE/THROAT SWELLING, SOB, OR LIGHTHEADEDNESS WITH HYPOTENSION:  #  #  #  YES  #  #  #   HAS PT DEVELOPED SEVERE RASH INVOLVING MUCUS MEMBRANES or SKIN NECROSIS: #  #  #  YES  #  #  #  Has patient had a PCN reaction that required hospitalization: No Has patient had a PCN reaction occurring within the last 10 years: No    Sulfa Antibiotics Hives and Rash   Cheese Nausea And Vomiting    Consultations: Cardiology   Procedures/Studies: ECHOCARDIOGRAM LIMITED  Result Date: 05/26/2022    ECHOCARDIOGRAM LIMITED  REPORT   Patient Name:   MARVELENE STONEBERG Heinlen Date of Exam: 05/26/2022 Medical Rec #:  [id number]        Height:       65.0 in Accession #:    [id number]       Weight:       106.7 lb Date of Birth:  [birth year]       BSA:          1.515 m Patient Age:    31 years         BP:           102/55 mmHg Patient Gender: F                HR:           112 bpm. Exam Location:  Inpatient Procedure: Limited Echo, Cardiac Doppler, Color Doppler and Intracardiac            Opacification Agent Indications:    R06.02 SOB  History:        Patient has prior history of Echocardiogram examinations, most                 recent 05/17/2022. Abnormal ECG and Pacemaker, Pulmonary HTN and                 COPD, Aortic Valve Disease and Mitral Valve Disease,                 Arrythmias:Tachycardia and Atrial Flutter;                 Signs/Symptoms:Dyspnea and Syncope. Cancer.                 Aortic Valve: 19 mm Inspiris Resilia valve is present in the                 aortic position. Procedure Date: 2023.                 Mitral Valve: 25 mm Medtronic bioprosthetic valve valve is                 present in the mitral position. Procedure Date: 2019.  Sonographer:    Roseanna Rainbow RDCS Referring Phys: [id number] [facility]  Sonographer Comments: Technically difficult study due to poor echo windows. Limited echo to evaluate valves and TDI. IMPRESSIONS  1. Left ventricular ejection fraction, by estimation, is 55 to 60%. The left ventricle has normal function.  Left ventricular diastolic function could not be evaluated.  2. Functional progressive mitral stenosis. The mean mitral valve gradient is 7.0 mmHg. There is a 25 mm Medtronic bioprosthetic valve present in the mitral position. Procedure Date: 2019.  3. Tricuspid valve regurgitation is mild to moderate.  4. The aortic valve has been repaired/replaced. Aortic valve regurgitation is trivial. No aortic stenosis is present. There is a 19 mm Inspiris Resilia valve present in the aortic position.  Procedure Date: 2023.  5. Pulmonic valve regurgitation is moderate to severe. Severe pulmonic stenosis.  6. There is mildly elevated pulmonary artery systolic pressure.  7. The inferior vena cava is dilated in size with >50% respiratory variability, suggesting right atrial pressure of 8 mmHg. FINDINGS  Left Ventricle: Left ventricular ejection fraction, by estimation, is 55 to 60%. The left ventricle has normal function. Definity contrast agent was given IV to delineate the left ventricular endocardial borders. There is no left ventricular hypertrophy. Abnormal (paradoxical) septal motion consistent with post-operative status. Left ventricular diastolic function could not be evaluated. Left ventricular diastolic function could not be evaluated due to mitral valve replacement. Right Ventricle: There is mildly elevated pulmonary artery systolic pressure. The tricuspid regurgitant velocity is 2.85 m/s, and with an assumed right atrial pressure of 8 mmHg, the estimated right ventricular systolic pressure is 54.0 mmHg. Mitral Valve: There is a 25 mm Medtronic bioprosthetic valve present in the mitral position. Procedure Date: 2019. Functional progressive mitral valve stenosis. MV peak gradient, 14.2 mmHg. The mean mitral valve gradient is 7.0 mmHg. Tricuspid Valve: Tricuspid valve regurgitation is mild to moderate. Aortic Valve: The aortic valve has been repaired/replaced. Aortic valve regurgitation is trivial. No aortic stenosis is present. Aortic valve mean gradient measures 4.7 mmHg. Aortic valve peak gradient measures 8.2 mmHg. Aortic valve area, by VTI measures 2.20 cm. There is a 19 mm Inspiris Resilia valve present in the aortic position. Procedure Date: 2023. Pulmonic Valve: Pulmonic valve regurgitation is moderate to severe. Severe pulmonic stenosis. Venous: The inferior vena cava is dilated in size with greater than 50% respiratory variability, suggesting right atrial pressure of 8 mmHg. Additional Comments:  Moderate ascites is present. LEFT VENTRICLE PLAX 2D LVIDd:         3.40 cm   Diastology LVIDs:         2.40 cm   LV e' medial:    5.11 cm/s LV PW:         0.90 cm   LV E/e' medial:  36.8 LV IVS:        0.90 cm   LV e' lateral:   6.53 cm/s LVOT diam:     1.95 cm   LV E/e' lateral: 28.8 LV SV:         48 LV SV Index:   32 LVOT Area:     2.99 cm  RIGHT VENTRICLE            IVC RV S prime:     4.88 cm/s  IVC diam: 1.80 cm RVOT diam:      2.00 cm AORTIC VALVE                    PULMONIC VALVE AV Area (Vmax):    2.38 cm     PR End Diast Vel: 2.61 msec AV Area (Vmean):   2.26 cm AV Area (VTI):     2.20 cm AV Vmax:           143.00 cm/s AV Vmean:  99.233 cm/s AV VTI:            0.220 m AV Peak Grad:      8.2 mmHg AV Mean Grad:      4.7 mmHg LVOT Vmax:         113.75 cm/s LVOT Vmean:        75.025 cm/s LVOT VTI:          0.162 m LVOT/AV VTI ratio: 0.74  AORTA Ao Root diam: 2.30 cm Ao Asc diam:  2.50 cm MITRAL VALVE                TRICUSPID VALVE MV Area (PHT): 5.27 cm     TR Peak grad:   32.5 mmHg MV Area VTI:   2.04 cm     TR Vmax:        285.00 cm/s MV Peak grad:  14.2 mmHg MV Mean grad:  7.0 mmHg     SHUNTS MV Vmax:       1.88 m/s     Systemic VTI:  0.16 m MV Vmean:      120.7 cm/s   Systemic Diam: 1.95 cm MV Decel Time: 144 msec     Pulmonic Diam: 2.00 cm MV E velocity: 188.00 cm/s Lavona Mound Tobb DO Electronically signed by Thomasene Ripple DO Signature Date/Time: 05/26/2022/6:25:13 PM    Final    DG Chest 2 View  Result Date: 05/25/2022 CLINICAL DATA:  Pleural effusion. EXAM: CHEST - 2 VIEW COMPARISON:  May 24, 2022. FINDINGS: Similar right and increased left bibasilar opacities. Cardiomediastinal silhouette is similar. Left subclavian approach cardiac rhythm maintenance device. Median sternotomy. Prosthetic cardiac valve. No evidence of acute osseous abnormality. Polyarticular degenerative change. IMPRESSION: Similar right and increased left bibasilar opacities, probably a combination of layering pleural  effusions and overlying atelectasis and/or consolidation. Electronically Signed   By: Feliberto Harts M.D.   On: 05/25/2022 07:54   IR THORACENTESIS ASP PLEURAL SPACE W/IMG GUIDE  Result Date: 05/24/2022 INDICATION: Heart valve replaced 04/08/22, pleural effusions, right side drained 05/23/22 EXAM: ULTRASOUND GUIDED LEFT THORACENTESIS MEDICATIONS: None. COMPLICATIONS: None immediate. PROCEDURE: An ultrasound guided thoracentesis was thoroughly discussed with the patient and questions answered. The benefits, risks, alternatives and complications were also discussed. The patient understands and wishes to proceed with the procedure. Written consent was obtained. Ultrasound was performed to localize and mark an adequate pocket of fluid in the left chest. The area was then prepped and draped in the normal sterile fashion. 1% Lidocaine was used for local anesthesia. Under ultrasound guidance a 6 Fr Safe-T-Centesis catheter was introduced. Thoracentesis was performed. The catheter was removed and a dressing applied. FINDINGS: A total of approximately of amber fluid was removed. IMPRESSION: Successful ultrasound guided left thoracentesis yielding of pleural fluid. Electronically Signed   By: Gilmer Mor D.O.   On: 05/24/2022 10:51   DG Chest 1 View  Result Date: 05/24/2022 CLINICAL DATA:  [id number] status post left thoracentesis EXAM: CHEST  1 VIEW COMPARISON:  Chest x-ray from yesterday FINDINGS: Again seen are sternotomy wires, mediastinal surgical clips and mitral valve prosthesis. Left subclavian dual lead pacer is stable. There has been interval left thoracentesis. Mild residual pleural effusion. No pneumothorax. Moderate right pleural effusion. There is some atelectasis at the right mid-lower lung zone. The visualized skeletal structures are unremarkable. IMPRESSION: There has been interval left thoracentesis. No pneumothorax. Otherwise, there has been no significant interval change. Electronically  Signed   By: Marya Amsler.D.  On: 05/24/2022 10:29   IR THORACENTESIS ASP PLEURAL SPACE W/IMG GUIDE  Result Date: 05/23/2022 INDICATION: Right pleural effusion s/p aortic valve replacement EXAM: ULTRASOUND GUIDED RIGHT THORACENTESIS MEDICATIONS: None. COMPLICATIONS: None immediate. PROCEDURE: An ultrasound guided thoracentesis was thoroughly discussed with the patient and questions answered. The benefits, risks, alternatives and complications were also discussed. The patient understands and wishes to proceed with the procedure. Written consent was obtained. Ultrasound was performed to localize and mark an adequate pocket of fluid in the right chest. The area was then prepped and draped in the normal sterile fashion. 1% Lidocaine was used for local anesthesia. Under ultrasound guidance a 6 Fr Safe-T-Centesis catheter was introduced. Thoracentesis was performed. Tubing revealed air leak and was exchanged for new tubing and thoracentesis was resumed. Following, the catheter was removed and a dressing applied. FINDINGS: A total of approximately 700cc of amber fluid was removed. IMPRESSION: Successful ultrasound guided right thoracentesis yielding 700cc of pleural fluid. Electronically Signed   By: Ruthann Cancer M.D.   On: 05/23/2022 11:04   DG Chest 1 View  Result Date: 05/23/2022 CLINICAL DATA:  Pleural effusion EXAM: CHEST  1 VIEW COMPARISON:  05/22/2022 FINDINGS: Right sided pacemaker overlies normal cardiac silhouette. Bilateral pleural effusions. The RIGHT pleural effusion is decreased from comparison exam. No pneumothorax. IMPRESSION: 1. No pneumothorax. 2. Decreased RIGHT pleural effusion. Electronically Signed   By: Suzy Bouchard M.D.   On: 05/23/2022 10:37   DG Chest 2 View  Result Date: 05/22/2022 CLINICAL DATA:  Shortness of breath EXAM: CHEST - 2 VIEW COMPARISON:  05/18/2022 FINDINGS: Cardiomegaly status post median sternotomy with left chest multi lead pacer and aortic valve prosthesis.  Unchanged moderate bilateral pleural effusions and associated atelectasis or consolidation. Mild disc degenerative disease of the thoracic spine. IMPRESSION: Unchanged moderate bilateral pleural effusions and associated atelectasis or consolidation. Findings are most consistent with edema. No acute appearing airspace opacity. Electronically Signed   By: Delanna Ahmadi M.D.   On: 05/22/2022 17:38   DG Chest 2 View  Result Date: 05/18/2022 CLINICAL DATA:  Aortic valve repair April 2023. Pleural effusion. Shortness of breath. EXAM: CHEST - 2 VIEW COMPARISON:  05/12/2022 FINDINGS: Dual lead pacer noted. Atherosclerotic calcification of the aortic arch. Aortic valve prosthesis noted. Prior median sternotomy and prior CABG. Moderate bilateral pleural effusions with passive atelectasis, similar to the prior exam. Similar fluid in the right major and minor fissures. No pneumothorax. Loss of disc height at the L2-3 level. IMPRESSION: Stable moderate bilateral pleural effusions with passive atelectasis. 1.  Aortic Atherosclerosis (ICD10-I70.0). Electronically Signed   By: Van Clines M.D.   On: 05/18/2022 15:08   ECHOCARDIOGRAM COMPLETE  Result Date: 05/17/2022    ECHOCARDIOGRAM REPORT   Patient Name:   TERRION POBLANO Takaki Date of Exam: 05/17/2022 Medical Rec #:  [id number]        Height:       65.1 in Accession #:    [id number]       Weight:       114.0 lb Date of Birth:  1951-08-10       BSA:          1.560 m Patient Age:    36 years         BP:           138/86 mmHg Patient Gender: F                HR:  92 bpm. Exam Location:  [address] Procedure: 2D Echo, Cardiac Doppler and Color Doppler Indications:    Z95.2 Status post Aortic valve replacement  History:        Patient has prior history of Echocardiogram examinations, most                 recent 04/07/2022. Mitral valve replacement-74mm Medtronic Mosaic                 Porcine Bioprosthetic( October 2018). Aortic valve                 replacement-19  mm Inspiris Resilia(April 2023). and Pacemaker,                 COPD; Arrythmias:Atrial Flutter.  Sonographer:    Wilford Sports Rodgers-Jones RDCS Referring Phys: [place]  1. Left ventricular ejection fraction, by estimation, is 60 to 65%. The left ventricle has normal function. The left ventricle demonstrates regional wall motion abnormalities (abnormal septal motion and septal bounce). Left ventricular diastolic function could not be evaluated.  2. Right ventricular systolic function was not well visualized. The right ventricular size is normal. There is moderately elevated pulmonary artery systolic pressure. The estimated right ventricular systolic pressure is 67.2 mmHg.  3. Left atrial size was mildly dilated.  4. The aortic valve has been replaced by a 19 mm Inspiris Resilia Valve. Aortic valve regurgitation is not present. Suboptimal LVOT spectral Doppler.     Aortic valve mean gradient measures 3.0 mmHg. Aortic valve acceleration time measures 84 msec     Echo findings are consistent with normal structure and function of the aortic valve prosthesis.  5. The mitral valve has been replaced by a 25 mm Mosaic Bioprosthetic valve. PHT 81 ms. Suboptimal LVOT spectral Doppler. No evidence of mitral valve regurgitation. No evidence of mitral stenosis. Mean gradient 4 mm Hg.  6. 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 Jet area takes up greater than 60% of RVOT. PHT 150 ms.  7. Tricuspid valve regurgitation is moderate. There are multiple jets. No heptic flow assessment.  8. The inferior vena cava is normal in size with <50% respiratory variability, suggesting right atrial pressure of 8 mmHg. Comparison(s): Compared to prior, s/p aortic valve replacement. Increase in pulmonic insufficiency. Increase in tricuspid regurgitation. Stable mitral valve prosthetic function. FINDINGS  Left Ventricle: Left ventricular ejection fraction, by estimation, is 60 to 65%. The left ventricle has normal function. The  left ventricle demonstrates regional wall motion abnormalities. The left ventricular internal cavity size was small. There is no  left ventricular hypertrophy. Left ventricular diastolic function could not be evaluated due to mitral valve replacement. Left ventricular diastolic function could not be evaluated. Right Ventricle: The right ventricular size is normal. No increase in right ventricular wall thickness. Right ventricular systolic function was not well visualized. There is moderately elevated pulmonary artery systolic pressure. The tricuspid regurgitant velocity is 3.15 m/s, and with an assumed right atrial pressure of 8 mmHg, the estimated right ventricular systolic pressure is 09.4 mmHg. Left Atrium: Left atrial size was mildly dilated. Right Atrium: Right atrial size was normal in size. Pericardium: There is no evidence of pericardial effusion. Mitral Valve: The mitral valve has been repaired/replaced. No evidence of mitral valve regurgitation. No evidence of mitral valve stenosis. MV peak gradient, 13.0 mmHg. The mean mitral valve gradient is 4.0 mmHg. Tricuspid Valve: The tricuspid valve is normal in structure. Tricuspid valve regurgitation is moderate. Aortic Valve: The aortic valve has  been repaired/replaced. Aortic valve regurgitation is not visualized. Aortic valve mean gradient measures 3.0 mmHg. Aortic valve peak gradient measures 4.9 mmHg. Aortic valve area, by VTI measures 1.27 cm. Echo findings are consistent with normal structure and function of the aortic valve prosthesis. Pulmonic Valve: The pulmonic valve was not well visualized. 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 No evidence of pulmonic stenosis. Aorta: The aortic root and ascending aorta are structurally normal, with no evidence of dilitation. Venous: The inferior vena cava is normal in size with less than 50% respiratory variability, suggesting right atrial pressure of 8 mmHg. IAS/Shunts: No atrial level shunt detected by color  flow Doppler. Additional Comments: A device lead is visualized in the right ventricle and right atrium.  LEFT VENTRICLE PLAX 2D LVIDd:         2.70 cm   Diastology LVIDs:         2.30 cm   LV e' medial:   4.03 cm/s LV PW:         1.20 cm   LV E/e' medial: 40.0 LV IVS:        1.20 cm LVOT diam:     1.74 cm LV SV:         24 LV SV Index:   16 LVOT Area:     2.38 cm  RIGHT VENTRICLE            IVC RV Basal diam:  3.70 cm    IVC diam: 1.90 cm RV S prime:     7.09 cm/s TAPSE (M-mode): 1.2 cm LEFT ATRIUM             Index        RIGHT ATRIUM           Index LA diam:        4.30 cm 2.76 cm/m   RA Area:     11.30 cm LA Vol (A2C):   51.4 ml 32.95 ml/m  RA Volume:   28.40 ml  18.21 ml/m LA Vol (A4C):   47.0 ml 30.13 ml/m LA Biplane Vol: 49.5 ml 31.73 ml/m  AORTIC VALVE AV Area (Vmax):    1.25 cm AV Area (Vmean):   1.33 cm AV Area (VTI):     1.27 cm AV Vmax:           110.22 cm/s AV Vmean:          75.700 cm/s AV VTI:            0.190 m AV Peak Grad:      4.9 mmHg AV Mean Grad:      3.0 mmHg LVOT Vmax:         58.00 cm/s LVOT Vmean:        42.333 cm/s LVOT VTI:          0.102 m LVOT/AV VTI ratio: 0.54  AORTA Ao Root diam: 2.40 cm Ao Asc diam:  2.60 cm MITRAL VALVE                TRICUSPID VALVE MV Area (PHT): 8.62 cm     TR Peak grad:   39.7 mmHg MV Peak grad:  13.0 mmHg    TR Vmax:        315.00 cm/s MV Mean grad:  4.0 mmHg MV Vmax:       1.80 m/s     SHUNTS MV Vmean:      84.2 cm/s    Systemic VTI:  0.10 m MV Decel Time: 88 msec  Systemic Diam: 1.74 cm MV E velocity: 161.00 cm/s MV A velocity: 45.40 cm/s MV E/A ratio:  3.55 Rudean Haskell MD Electronically signed by Rudean Haskell MD Signature Date/Time: 05/17/2022/1:35:15 PM    Final    DG Chest 2 View  Result Date: 05/12/2022 CLINICAL DATA:  Post AVR EXAM: CHEST - 2 VIEW COMPARISON:  04/10/2022 FINDINGS: LEFT subclavian sequential transvenous pacemaker leads project at RIGHT atrium and RIGHT ventricle. Normal heart size post MVR and AVR.  Atherosclerotic calcifications aorta. Bibasilar effusions and atelectasis slightly greater on RIGHT. Upper lungs clear. No pneumothorax or acute osseous findings. IMPRESSION: Bibasilar effusions and atelectasis greater on RIGHT, both increased since previous exam. Resolution of previously seen RIGHT apex pneumothorax. Electronically Signed   By: Lavonia Dana M.D.   On: 05/12/2022 12:02   CUP PACEART INCLINIC DEVICE CHECK  Result Date: 05/02/2022 Pacemaker check in clinic. Normal device function. Thresholds, sensing, impedances consistent with previous measurements. Device programmed to maximize longevity. Known PAF. Burden 0.5%. On coumadin s/p AVR. Most recent episode 5/11.  Device programmed at appropriate safety margins. Histogram distribution appropriate for patient activity level. Device programmed to optimize intrinsic conduction. Estimated longevity 5 yr, 11 mo. Patient enrolled in remote follow-up. Patient education completed.     Subjective: Patient seen and examined at bedside this morning.  Hemodynamically stable for discharge today.  Discharge Exam: Vitals:   05/28/22 0400 05/28/22 0803  BP:  (!) 111/55  Pulse: 87 95  Resp: 15 20  Temp:  97.7 F (36.5 C)  SpO2: 92% 95%   Vitals:   05/28/22 0335 05/28/22 0400 05/28/22 0500 05/28/22 0803  BP: 115/61   (!) 111/55  Pulse: 88 87  95  Resp: $Remo'16 15  20  'KDppY$ Temp: 98 F (36.7 C)   97.7 F (36.5 C)  TempSrc: Oral   Oral  SpO2: 94% 92%  95%  Weight:   48 kg   Height:        General: Pt is alert, awake, not in acute distress Cardiovascular: RRR, S1/S2 +, no rubs, no gallops Respiratory: CTA bilaterally, no wheezing, no rhonchi Abdominal: Soft, NT, ND, bowel sounds + Extremities: no edema, no cyanosis    The results of significant diagnostics from this hospitalization (including imaging, microbiology, ancillary and laboratory) are listed below for reference.     Microbiology: No results found for this or any previous visit  (from the past 240 hour(s)).   Labs: BNP (last 3 results) Recent Labs    12/17/21 0942 05/22/22 1659 05/26/22 0111  BNP 229* 494.6* 834.1*   Basic Metabolic Panel: Recent Labs  Lab 05/24/22 0413 05/25/22 0238 05/26/22 0111 05/27/22 0141 05/28/22 0025  NA 139 140 136 138 133*  K 3.6 3.7 3.8 4.7 4.6  CL 103 101 97* 98 95*  CO2 $Re'27 31 29 27 27  'imY$ GLUCOSE 115* 113* 117* 124* 126*  BUN $Re'17 17 20 'VQU$ 26* 33*  CREATININE 1.23* 1.39* 1.32* 1.47* 1.77*  CALCIUM 8.8* 8.5* 8.4* 9.0 8.7*   Liver Function Tests: No results for input(s): "AST", "ALT", "ALKPHOS", "BILITOT", "PROT", "ALBUMIN" in the last 168 hours. No results for input(s): "LIPASE", "AMYLASE" in the last 168 hours. No results for input(s): "AMMONIA" in the last 168 hours. CBC: Recent Labs  Lab 05/22/22 1659 05/23/22 0508 05/24/22 0413 05/25/22 0238  WBC 18.6* 17.1* 10.9* 10.3  HGB 11.5* 10.6* 9.6* 10.3*  HCT 37.1 33.6* 30.5* 32.2*  MCV 87.3 87.3 86.6 85.9  PLT 446* 393 356 393   Cardiac  Enzymes: No results for input(s): "CKTOTAL", "CKMB", "CKMBINDEX", "TROPONINI" in the last 168 hours. BNP: Invalid input(s): "POCBNP" CBG: No results for input(s): "GLUCAP" in the last 168 hours. D-Dimer No results for input(s): "DDIMER" in the last 72 hours. Hgb A1c No results for input(s): "HGBA1C" in the last 72 hours. Lipid Profile No results for input(s): "CHOL", "HDL", "LDLCALC", "TRIG", "CHOLHDL", "LDLDIRECT" in the last 72 hours. Thyroid function studies No results for input(s): "TSH", "T4TOTAL", "T3FREE", "THYROIDAB" in the last 72 hours.  Invalid input(s): "FREET3" Anemia work up No results for input(s): "VITAMINB12", "FOLATE", "FERRITIN", "TIBC", "IRON", "RETICCTPCT" in the last 72 hours. Urinalysis    Component Value Date/Time   COLORURINE YELLOW 04/05/2022 1159   APPEARANCEUR CLEAR 04/05/2022 1159   LABSPEC >1.046 (H) 04/05/2022 1159   PHURINE 6.0 04/05/2022 1159   GLUCOSEU NEGATIVE 04/05/2022 1159   HGBUR  NEGATIVE 04/05/2022 1159   BILIRUBINUR NEGATIVE 04/05/2022 1159   KETONESUR NEGATIVE 04/05/2022 1159   PROTEINUR NEGATIVE 04/05/2022 1159   UROBILINOGEN 0.2 12/25/2014 0023   NITRITE NEGATIVE 04/05/2022 1159   LEUKOCYTESUR NEGATIVE 04/05/2022 1159   Sepsis Labs Recent Labs  Lab 05/22/22 1659 05/23/22 0508 05/24/22 0413 05/25/22 0238  WBC 18.6* 17.1* 10.9* 10.3   Microbiology No results found for this or any previous visit (from the past 240 hour(s)).  Please note: You were cared for by a hospitalist during your hospital stay. Once you are discharged, your primary care physician will handle any further medical issues. Please note that NO REFILLS for any discharge medications will be authorized once you are discharged, as it is imperative that you return to your primary care physician (or establish a relationship with a primary care physician if you do not have one) for your post hospital discharge needs so that they can reassess your need for medications and monitor your lab values.    Time coordinating discharge: 40 minutes  SIGNED:   Shelly Coss, MD  [facility] 05/28/2022, 10:29 AM Pager [phone]  If 7PM-7AM, please contact night-coverage [email or web address] Password [email or web address]

## 2022-05-28 NOTE — Progress Notes (Signed)
Progress Note  Patient Name: Patricia [name] Date of Encounter: 05/28/2022  Primary Cardiologist: Minus Breeding, MD   Subjective   Creatinine has increased (1.79) Heart rates better controlled, has had some A pacing; and has sinus P waves today Patient feels great.  No CP, SOB or palpitations. No cough.  Inpatient Medications    Scheduled Meds:  amiodarone  100 mg Oral Daily   atorvastatin  20 mg Oral Daily   levothyroxine  50 mcg Oral QAC breakfast   lipase/protease/amylase  36,000 Units Oral TID AC   metoprolol tartrate  50 mg Oral BID   pantoprazole  40 mg Oral Daily   potassium chloride  40 mEq Oral BID   warfarin  2.5 mg Oral Q M,W,F-1800   warfarin  5 mg Oral Q T,Th,S,Su-1800   Warfarin - Physician Dosing Inpatient   Does not apply q1600   Continuous Infusions:   PRN Meds: lidocaine (PF), mouth rinse   Vital Signs    Vitals:   05/28/22 0335 05/28/22 0400 05/28/22 0500 05/28/22 0803  BP: 115/61   (!) 111/55  Pulse: 88 87  95  Resp: '16 15  20  '$ Temp: 98 F (36.7 C)   97.7 F (36.5 C)  TempSrc: Oral   Oral  SpO2: 94% 92%  95%  Weight:   48 kg   Height:        Intake/Output Summary (Last 24 hours) at 05/28/2022 1013 Last data filed at 05/27/2022 2100 Gross per 24 hour  Intake 125 ml  Output 1200 ml  Net -1075 ml   Filed Weights   05/26/22 0611 05/27/22 0423 05/28/22 0500  Weight: 48.4 kg 47.7 kg 48 kg    Telemetry    AS-VP and AP-BP  heart rates controlled- Personally Reviewed  Physical Exam   Gen: no distress, elderly female  Neck: JVD but with V wave nadir at level of the clavicle Cardiac: No Rubs or Gallops, Loud P2 with RV Heave, regular rate, +2 radial pulses Respiratory:  clear to ausculation bilaterally normal effort, normal  respiratory rate GI: Soft, nontender, non-distended  MS: No edema;  moves all extremities Integument: Skin feels warm Neuro:  At time of evaluation, alert and oriented to person/place/time/situation  Psych:  Normal affect, patient feels well   Labs    Chemistry Recent Labs  Lab 05/26/22 0111 05/27/22 0141 05/28/22 0025  NA 136 138 133*  K 3.8 4.7 4.6  CL 97* 98 95*  CO2 '29 27 27  '$ GLUCOSE 117* 124* 126*  BUN 20 26* 33*  CREATININE 1.32* 1.47* 1.77*  CALCIUM 8.4* 9.0 8.7*  GFRNONAA 43* 38* 30*  ANIONGAP '10 13 11     '$ Hematology Recent Labs  Lab 05/23/22 0508 05/24/22 0413 05/25/22 0238  WBC 17.1* 10.9* 10.3  RBC 3.85* 3.52* 3.75*  HGB 10.6* 9.6* 10.3*  HCT 33.6* 30.5* 32.2*  MCV 87.3 86.6 85.9  MCH 27.5 27.3 27.5  MCHC 31.5 31.5 32.0  RDW 16.1* 16.0* 16.1*  PLT 393 356 393    Cardiac EnzymesNo results for input(s): "TROPONINI" in the last 168 hours. No results for input(s): "TROPIPOC" in the last 168 hours.   BNP Recent Labs  Lab 05/22/22 1659 05/26/22 0111  BNP 494.6* 216.0*     DDimer No results for input(s): "DDIMER" in the last 168 hours.   Radiology    ECHOCARDIOGRAM LIMITED  Result Date: 05/26/2022    ECHOCARDIOGRAM LIMITED REPORT   Patient Name:   Patricia [name]  Darr Date of Exam: 05/26/2022 Medical Rec #:  [id number]        Height:       65.0 in Accession #:    [id number]       Weight:       106.7 lb Date of Birth:  [birth year]       BSA:          1.515 m Patient Age:    71 years         BP:           102/55 mmHg Patient Gender: F                HR:           112 bpm. Exam Location:  Inpatient Procedure: Limited Echo, Cardiac Doppler, Color Doppler and Intracardiac            Opacification Agent Indications:    R06.02 SOB  History:        Patient has prior history of Echocardiogram examinations, most                 recent 05/17/2022. Abnormal ECG and Pacemaker, Pulmonary HTN and                 COPD, Aortic Valve Disease and Mitral Valve Disease,                 Arrythmias:Tachycardia and Atrial Flutter;                 Signs/Symptoms:Dyspnea and Syncope. Cancer.                 Aortic Valve: 19 mm Inspiris Resilia valve is present in the                 aortic  position. Procedure Date: 2023.                 Mitral Valve: 25 mm Medtronic bioprosthetic valve valve is                 present in the mitral position. Procedure Date: 2019.  Sonographer:    Roseanna Rainbow RDCS Referring Phys: [id number] [facility]  Sonographer Comments: Technically difficult study due to poor echo windows. Limited echo to evaluate valves and TDI. IMPRESSIONS  1. Left ventricular ejection fraction, by estimation, is 55 to 60%. The left ventricle has normal function. Left ventricular diastolic function could not be evaluated.  2. Functional progressive mitral stenosis. The mean mitral valve gradient is 7.0 mmHg. There is a 25 mm Medtronic bioprosthetic valve present in the mitral position. Procedure Date: 2019.  3. Tricuspid valve regurgitation is mild to moderate.  4. The aortic valve has been repaired/replaced. Aortic valve regurgitation is trivial. No aortic stenosis is present. There is a 19 mm Inspiris Resilia valve present in the aortic position. Procedure Date: 2023.  5. Pulmonic valve regurgitation is moderate to severe. Severe pulmonic stenosis.  6. There is mildly elevated pulmonary artery systolic pressure.  7. The inferior vena cava is dilated in size with >50% respiratory variability, suggesting right atrial pressure of 8 mmHg. FINDINGS  Left Ventricle: Left ventricular ejection fraction, by estimation, is 55 to 60%. The left ventricle has normal function. Definity contrast agent was given IV to delineate the left ventricular endocardial borders. There is no left ventricular hypertrophy. Abnormal (paradoxical) septal motion consistent with post-operative status. Left ventricular diastolic function could not be evaluated. Left ventricular diastolic function could  not be evaluated due to mitral valve replacement. Right Ventricle: There is mildly elevated pulmonary artery systolic pressure. The tricuspid regurgitant velocity is 2.85 m/s, and with an assumed right atrial pressure of 8  mmHg, the estimated right ventricular systolic pressure is 63.8 mmHg. Mitral Valve: There is a 25 mm Medtronic bioprosthetic valve present in the mitral position. Procedure Date: 2019. Functional progressive mitral valve stenosis. MV peak gradient, 14.2 mmHg. The mean mitral valve gradient is 7.0 mmHg. Tricuspid Valve: Tricuspid valve regurgitation is mild to moderate. Aortic Valve: The aortic valve has been repaired/replaced. Aortic valve regurgitation is trivial. No aortic stenosis is present. Aortic valve mean gradient measures 4.7 mmHg. Aortic valve peak gradient measures 8.2 mmHg. Aortic valve area, by VTI measures 2.20 cm. There is a 19 mm Inspiris Resilia valve present in the aortic position. Procedure Date: 2023. Pulmonic Valve: Pulmonic valve regurgitation is moderate to severe. Severe pulmonic stenosis. Venous: The inferior vena cava is dilated in size with greater than 50% respiratory variability, suggesting right atrial pressure of 8 mmHg. Additional Comments: Moderate ascites is present. LEFT VENTRICLE PLAX 2D LVIDd:         3.40 cm   Diastology LVIDs:         2.40 cm   LV e' medial:    5.11 cm/s LV PW:         0.90 cm   LV E/e' medial:  36.8 LV IVS:        0.90 cm   LV e' lateral:   6.53 cm/s LVOT diam:     1.95 cm   LV E/e' lateral: 28.8 LV SV:         48 LV SV Index:   32 LVOT Area:     2.99 cm  RIGHT VENTRICLE            IVC RV S prime:     4.88 cm/s  IVC diam: 1.80 cm RVOT diam:      2.00 cm AORTIC VALVE                    PULMONIC VALVE AV Area (Vmax):    2.38 cm     PR End Diast Vel: 2.61 msec AV Area (Vmean):   2.26 cm AV Area (VTI):     2.20 cm AV Vmax:           143.00 cm/s AV Vmean:          99.233 cm/s AV VTI:            0.220 m AV Peak Grad:      8.2 mmHg AV Mean Grad:      4.7 mmHg LVOT Vmax:         113.75 cm/s LVOT Vmean:        75.025 cm/s LVOT VTI:          0.162 m LVOT/AV VTI ratio: 0.74  AORTA Ao Root diam: 2.30 cm Ao Asc diam:  2.50 cm MITRAL VALVE                TRICUSPID  VALVE MV Area (PHT): 5.27 cm     TR Peak grad:   32.5 mmHg MV Area VTI:   2.04 cm     TR Vmax:        285.00 cm/s MV Peak grad:  14.2 mmHg MV Mean grad:  7.0 mmHg     SHUNTS MV Vmax:       1.88 m/s  Systemic VTI:  0.16 m MV Vmean:      120.7 cm/s   Systemic Diam: 1.95 cm MV Decel Time: 144 msec     Pulmonic Diam: 2.00 cm MV E velocity: 188.00 cm/s Kardie Tobb DO Electronically signed by Berniece Salines DO Signature Date/Time: 05/26/2022/6:25:13 PM    Final       Patient Profile     71 y.o. 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      Rudean Haskell, MD Cardiologist [facility] [place], [address] [place], [place] [address] [phone]  10:13 AM

## 2022-05-28 NOTE — Plan of Care (Signed)
  Problem: Education: Goal: Knowledge of General Education information will improve Description: Including pain rating scale, medication(s)/side effects and non-pharmacologic comfort measures Outcome: Adequate for Discharge   Problem: Health Behavior/Discharge Planning: Goal: Ability to manage health-related needs will improve Outcome: Adequate for Discharge   Problem: Clinical Measurements: Goal: Ability to maintain clinical measurements within normal limits will improve Outcome: Adequate for Discharge Goal: Will remain free from infection Outcome: Adequate for Discharge Goal: Diagnostic test results will improve Outcome: Adequate for Discharge Goal: Respiratory complications will improve Outcome: Adequate for Discharge Goal: Cardiovascular complication will be avoided Outcome: Adequate for Discharge   Problem: Nutrition: Goal: Adequate nutrition will be maintained Outcome: Adequate for Discharge   Problem: Activity: Goal: Risk for activity intolerance will decrease Outcome: Adequate for Discharge   Problem: Coping: Goal: Level of anxiety will decrease Outcome: Adequate for Discharge

## 2022-05-30 ENCOUNTER — Encounter: Payer: Medicare PPO | Admitting: *Deleted

## 2022-05-30 ENCOUNTER — Telehealth: Payer: Self-pay

## 2022-05-30 DIAGNOSIS — Z952 Presence of prosthetic heart valve: Secondary | ICD-10-CM | POA: Diagnosis not present

## 2022-05-30 NOTE — Progress Notes (Signed)
Daily Session Note  Patient Details  Name: GENNIE EISINGER MRN: [id number] Date of Birth: 07-13-1951 Referring Provider:   Flowsheet Row Cardiac Rehab from 05/12/2022 in [facility] Cardiac and Pulmonary Rehab  Referring Provider Eleonore Chiquito MD       Encounter Date: 05/30/2022  Check In:  Session Check In - 05/30/22 0932       Check-In   Supervising physician immediately available to respond to emergencies See telemetry face sheet for immediately available ER MD    Location [facility]-Cardiac & Pulmonary Rehab    Staff Present Justin Mend, Jaci Carrel, BS, ACSM CEP, Exercise Physiologist;Neils Siracusa, RN, BSN, CCRP    Virtual Visit No    Medication changes reported     Lasix changed to torsemide,     Fall or balance concerns reported    No    Warm-up and Cool-down Performed on first and last piece of equipment    Resistance Training Performed Yes    VAD Patient? No    PAD/SET Patient? No      Pain Assessment   Currently in Pain? No/denies                Social History   Tobacco Use  Smoking Status Never  Smokeless Tobacco Never    Goals Met:  Independence with exercise equipment Exercise tolerated well No report of concerns or symptoms today  Goals Unmet:  Not Applicable  Comments: Pt able to follow exercise prescription today without complaint.  Will continue to monitor for progression. Returning after pleural effusions removed. Short stay in hospital.    Dr. Emily Filbert is Medical Director for [place].  Dr. Ottie Glazier is Medical Director for [facility] Pulmonary Rehabilitation.

## 2022-05-30 NOTE — Telephone Encounter (Signed)
Transition Care Management Follow-up Telephone Call Date of discharge and from where: 05/28/22 [facility] How have you been since you were released from the hospital? Pt states she is doing, "Much better." Any questions or concerns? No  Items Reviewed: Did the pt receive and understand the discharge instructions provided? Yes  Medications obtained and verified? Yes  Other? Yes  Any new allergies since your discharge? No  Dietary orders reviewed? Yes Do you have support at home? Yes   Home Care and Equipment/Supplies: Were home health services ordered? not applicable If so, what is the name of the agency? N/A  Has the agency set up a time to come to the patient's home? not applicable Were any new equipment or medical supplies ordered?  No What is the name of the medical supply agency? N/A Were you able to get the supplies/equipment? not applicable Do you have any questions related to the use of the equipment or supplies? No  Functional Questionnaire: (I = Independent and D = Dependent) ADLs:  I   Bathing/Dressing- I  Meal Prep- I  Eating- I  Maintaining continence- I  Transferring/Ambulation- I  Managing Meds- I  Follow up appointments reviewed:  PCP Hospital f/u appt confirmed? Yes  Scheduled to see Dr. Dennard Schaumann  on 06/06/22 @ 2:30 pm. [facility] f/u appt confirmed? Yes  Scheduled to see Dr. Cyndia Bent on 05/31/2022 @ 1 pm. Are transportation arrangements needed? No  If their condition worsens, is the pt aware to call PCP or go to the Emergency Dept.? Yes Was the patient provided with contact information for the PCP's office or ED? Yes Was to pt encouraged to call back with questions or concerns? Yes

## 2022-05-31 ENCOUNTER — Ambulatory Visit
Admission: RE | Admit: 2022-05-31 | Discharge: 2022-05-31 | Disposition: A | Discharge status: Home / Self Care | Payer: Medicare PPO | Source: Ambulatory Visit | Attending: Surgery | Admitting: Surgery

## 2022-05-31 ENCOUNTER — Encounter: Payer: Self-pay | Admitting: Surgery

## 2022-05-31 ENCOUNTER — Ambulatory Visit (INDEPENDENT_AMBULATORY_CARE_PROVIDER_SITE_OTHER): Payer: Self-pay | Admitting: Surgery

## 2022-05-31 VITALS — BP 93/52 | HR 88 | Resp 20 | Ht 65.0 in | Wt 108.2 lb

## 2022-05-31 DIAGNOSIS — Z952 Presence of prosthetic heart valve: Secondary | ICD-10-CM | POA: Diagnosis not present

## 2022-05-31 DIAGNOSIS — J811 Chronic pulmonary edema: Secondary | ICD-10-CM | POA: Diagnosis not present

## 2022-05-31 NOTE — Progress Notes (Signed)
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  Her initial postoperative course was complicated by atrial flutter underneath her permanent pacemaker.  She was started on amiodarone and EP consult was requested and they adjusted her intrinsic permanent pacemaker to assist with rate control.  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  She returned last week with moderate to large bilateral pleural effusions and progressive shortness of breath.  She underwent bilateral thoracentesis last week removing 700 cc of amber fluid on the right on 05/23/2022 and then 550 cc of amber fluid from the left side on 05/24/2022.  Her chest x-ray showed complete drainage of the left pleural effusion but there was still some right pleural effusion at the conclusion of the right thoracentesis.  She was started on torsemide 40 mg daily and discharged home.  She has continued to feel well since discharge.  She is back in cardiac rehab.  She has had no dizziness.  She denies any shortness of breath.    Current Outpatient Medications  Medication Sig Dispense Refill   acetaminophen (TYLENOL) 325 MG tablet Take 2 tablets (650 mg total) by mouth 2 (two) times daily as needed for moderate pain or headache.     alendronate (FOSAMAX) 70 MG tablet Take 1 tablet (70 mg total) by mouth once a week. (Patient taking differently: Take 70 mg by mouth once a week. Sunday) 12 tablet 3   amiodarone (PACERONE) 200 MG tablet Take 0.5 tablets (100 mg total) by mouth daily. 45 tablet 3   atorvastatin (LIPITOR) 20 MG tablet Take 1 tablet (20 mg total) by mouth daily. 30 tablet 3   Blood Glucose Monitoring Suppl (ACCU-CHEK GUIDE)  w/Device KIT 1 Units by Does not apply route daily. 1 kit 0   Calcium Carbonate (CALCIUM 600 PO) Take 600 mg by mouth 2 (two) times daily. Lunch and Dinner     CREON 36000-114000 units CPEP capsule Take 36,000 Units by mouth 3 (three) times daily before meals.     esomeprazole (NEXIUM) 40 MG capsule Take 40 mg by mouth daily before breakfast.   12   fluticasone (FLONASE) 50 MCG/ACT nasal spray Place 2 sprays into both nostrils daily. (Patient taking differently: Place 2 sprays into both nostrils daily as needed for rhinitis.) 16 g 6   glucose blood (ACCU-CHEK GUIDE) test strip USE TO TEST BLOOD SUGARS EVERY MORNING 100 each 3   Lancets (ACCU-CHEK SOFT TOUCH) lancets Check BS QAM 100 each 3   Lancets Misc. (ACCU-CHEK SOFTCLIX LANCET DEV) KIT Check BS QAM 1 kit 0   levothyroxine (SYNTHROID) 50 MCG tablet TAKE 1 TABLET BY MOUTH ONCE A DAY (Patient taking differently: Take 50 mcg by mouth daily before breakfast.) 90 tablet 3   metoprolol tartrate (LOPRESSOR) 50 MG tablet Take 1 tablet (50 mg total) by mouth 2 (two) times daily. 60 tablet 1   Multiple Vitamin (MULTIVITAMIN WITH MINERALS) TABS tablet Take 1 tablet by mouth daily with lunch. Centrum Silver.     potassium chloride (KLOR-CON) 20 MEQ packet Take 20 mEq by mouth daily. 30 packet 0   torsemide 40 MG TABS Take 40 mg by mouth daily. 30 tablet 1  traMADol (ULTRAM) 50 MG tablet Take 1 tablet (50 mg total) by mouth every 6 (six) hours as needed for moderate pain. 28 tablet 0   warfarin (COUMADIN) 5 MG tablet Take 1 tablet (5 mg total) by mouth daily. (Patient taking differently: Take 2.5-5 mg by mouth See admin instructions. 5 mg on Tuesday,Thursday,Saturday,Sunday 2.5 mg on Monday,Wednesday,friday) 30 tablet 3   No current facility-administered medications for this visit.     Physical Exam: BP (!) 93/52 (BP Location: Right Arm, Patient Position: Sitting, Cuff Size: Normal)   Pulse 88   Resp 20   Ht $R'5\' 5"'vw$  (1.651 m)   Wt 108 lb 3.2 oz (49.1  kg)   SpO2 98% Comment: RA  BMI 18.01 kg/m  She looks well. Cardiac exam shows a regular rate and rhythm with normal heart sounds.  There is no murmur. Lung exam reveals slight decreased breath sounds at the right base. The chest incision is well-healed and the sternum is stable. There is no peripheral edema.  Diagnostic Tests:  Narrative & Impression  CLINICAL DATA:  Pleural effusion   EXAM: CHEST - 2 VIEW   COMPARISON:  Previous studies including the examination of 05/25/2022   FINDINGS: Cardiac size is within normal limits. Central pulmonary vessels are less prominent. There is improvement in aeration of both lower lung fields. There is residual small to moderate right pleural effusion. Evaluation of right lower lung fields for infiltrates is limited by the effusion. There is faint homogeneous opacity in the right parahilar region, possibly fluid in the interlobar fissure. Left lateral CP angle is clear. There is no pneumothorax.   IMPRESSION: There is interval decrease in pulmonary vascular congestion. There is interval clearing of left pleural effusion and decrease in amount of right pleural effusion. There is small to moderate residual right pleural effusion. Faint homogeneous opacity in the right parahilar region may suggest loculated fluid in the interlobar fissure.     Electronically Signed   By: Elmer Picker M.D.   On: 05/31/2022 12:32    Impression:  She is doing well clinically.  Chest x-ray today shows a small to moderate residual right pleural effusion and complete clearing of the left pleural effusion.  I think the right pleural effusion looks smaller than it did at discharge.  I recommended that we continue her current diuretic dose for now.  She should remain on amiodarone for now and Coumadin for about 3 months postoperatively.  She has EP follow-up with Dr. Lovena Le in July and he can decide how long to keep her on amiodarone.  Plan:  I will  see her back in 1 month with a repeat chest x-ray.   [name], MD [facility] [phone]

## 2022-05-31 NOTE — Progress Notes (Signed)
Daily Session Note  Patient Details  Name: Patricia [name] MRN: [id number] Date of Birth: [birth year] Referring Provider:   Flowsheet Row Cardiac Rehab from 05/12/2022 in [facility] Cardiac and Pulmonary Rehab  Referring Provider Eleonore Chiquito MD       Encounter Date: 05/31/2022  Check In:  Session Check In - 05/31/22 0917       Check-In   Supervising physician immediately available to respond to emergencies See telemetry face sheet for immediately available ER MD    Location [facility]-Cardiac & Pulmonary Rehab    Staff Present Earlean Shawl, BS, ACSM CEP, Exercise Physiologist;Melissa [place], RDN, Luther Redo, MPA, RN    Virtual Visit No    Medication changes reported     No    Fall or balance concerns reported    No    Tobacco Cessation No Change    Warm-up and Cool-down Performed on first and last piece of equipment    Resistance Training Performed Yes    VAD Patient? No    PAD/SET Patient? No      Pain Assessment   Currently in Pain? No/denies                Social History   Tobacco Use  Smoking Status Never  Smokeless Tobacco Never    Goals Met:  Independence with exercise equipment Exercise tolerated well No report of concerns or symptoms today Strength training completed today  Goals Unmet:  Not Applicable  Comments: Pt able to follow exercise prescription today without complaint.  Will continue to monitor for progression.    Dr. Emily Filbert is Medical Director for [facility].  Dr. Ottie Glazier is Medical Director for [facility] Pulmonary Rehabilitation.

## 2022-06-02 DIAGNOSIS — Z952 Presence of prosthetic heart valve: Secondary | ICD-10-CM | POA: Diagnosis not present

## 2022-06-02 NOTE — Progress Notes (Signed)
Daily Session Note  Patient Details  Name: Patricia [name] MRN: [id number] Date of Birth: [birth year] Referring Provider:   Flowsheet Row Cardiac Rehab from 05/12/2022 in [facility] Cardiac and Pulmonary Rehab  Referring Provider Eleonore Chiquito MD       Encounter Date: 06/02/2022  Check In:  Session Check In - 06/02/22 0924       Check-In   Supervising physician immediately available to respond to emergencies See telemetry face sheet for immediately available ER MD    Location [facility]-Cardiac & Pulmonary Rehab    Staff Present Carson Myrtle, BS, RRT, CPFT;Dalen [place], MPA, RN;Joseph Tessie Fass, [place]    Virtual Visit No    Medication changes reported     No    Fall or balance concerns reported    No    Tobacco Cessation No Change    Warm-up and Cool-down Performed on first and last piece of equipment    Resistance Training Performed Yes    VAD Patient? No    PAD/SET Patient? No      Pain Assessment   Currently in Pain? No/denies                Social History   Tobacco Use  Smoking Status Never  Smokeless Tobacco Never    Goals Met:  Independence with exercise equipment Exercise tolerated well No report of concerns or symptoms today Strength training completed today  Goals Unmet:  Not Applicable  Comments: Pt able to follow exercise prescription today without complaint.  Will continue to monitor for progression.    Dr. Emily Filbert is Medical Director for [facility].  Dr. Ottie Glazier is Medical Director for [facility] Pulmonary Rehabilitation.

## 2022-06-03 ENCOUNTER — Ambulatory Visit (INDEPENDENT_AMBULATORY_CARE_PROVIDER_SITE_OTHER): Payer: Medicare PPO

## 2022-06-03 DIAGNOSIS — Z952 Presence of prosthetic heart valve: Secondary | ICD-10-CM | POA: Diagnosis not present

## 2022-06-03 DIAGNOSIS — Z7901 Long term (current) use of anticoagulants: Secondary | ICD-10-CM

## 2022-06-03 DIAGNOSIS — Z5181 Encounter for therapeutic drug level monitoring: Secondary | ICD-10-CM | POA: Diagnosis not present

## 2022-06-03 LAB — POCT INR: INR: 1.8 — AB (ref 2.0–3.0)

## 2022-06-06 ENCOUNTER — Ambulatory Visit: Payer: Medicare PPO | Admitting: Family Medicine

## 2022-06-06 ENCOUNTER — Encounter: Payer: Self-pay | Admitting: Family Medicine

## 2022-06-06 ENCOUNTER — Encounter: Payer: Medicare PPO | Admitting: *Deleted

## 2022-06-06 VITALS — BP 132/72 | HR 88 | Temp 98.8°F | Ht 65.0 in | Wt 108.0 lb

## 2022-06-06 DIAGNOSIS — R0600 Dyspnea, unspecified: Secondary | ICD-10-CM | POA: Diagnosis not present

## 2022-06-06 DIAGNOSIS — Z952 Presence of prosthetic heart valve: Secondary | ICD-10-CM | POA: Diagnosis not present

## 2022-06-06 DIAGNOSIS — Z9889 Other specified postprocedural states: Secondary | ICD-10-CM

## 2022-06-06 DIAGNOSIS — J810 Acute pulmonary edema: Secondary | ICD-10-CM | POA: Diagnosis not present

## 2022-06-06 NOTE — Progress Notes (Signed)
Daily Session Note  Patient Details  Name: Patricia [name] MRN: [id number] Date of Birth: Jul 08, 1951 Referring Provider:   Flowsheet Row Cardiac Rehab from 05/12/2022 in [facility] Cardiac and Pulmonary Rehab  Referring Provider Lennie Odor MD       Encounter Date: 06/06/2022  Check In:  Session Check In - 06/06/22 1016       Check-In   Supervising physician immediately available to respond to emergencies See telemetry face sheet for immediately available ER MD    Location [facility]-Cardiac & Pulmonary Rehab    Staff Present Cora Collum, RN, BSN, Matthew Saras, BS, ACSM CEP, Exercise Physiologist;Kelly Myles Gip, MPA, RN    Virtual Visit No    Medication changes reported     No    Fall or balance concerns reported    No    Warm-up and Cool-down Performed on first and last piece of equipment    Resistance Training Performed Yes    VAD Patient? No    PAD/SET Patient? No      Pain Assessment   Currently in Pain? No/denies                Social History   Tobacco Use  Smoking Status Never  Smokeless Tobacco Never    Goals Met:  Independence with exercise equipment Exercise tolerated well No report of concerns or symptoms today  Goals Unmet:  Not Applicable  Comments: Pt able to follow exercise prescription today without complaint.  Will continue to monitor for progression.    Dr. Bethann Punches is Medical Director for [facility] Cardiac Rehabilitation.  Dr. Vida Rigger is Medical Director for [facility] Pulmonary Rehabilitation.

## 2022-06-07 LAB — BASIC METABOLIC PANEL WITH GFR
BUN/Creatinine Ratio: 20 (calc) (ref 6–22)
BUN: 29 mg/dL — ABNORMAL HIGH (ref 7–25)
CO2: 32 mmol/L (ref 20–32)
Calcium: 9.8 mg/dL (ref 8.6–10.4)
Chloride: 96 mmol/L — ABNORMAL LOW (ref 98–110)
Creat: 1.48 mg/dL — ABNORMAL HIGH (ref 0.60–1.00)
Glucose, Bld: 136 mg/dL — ABNORMAL HIGH (ref 65–99)
Potassium: 4.8 mmol/L (ref 3.5–5.3)
Sodium: 140 mmol/L (ref 135–146)
eGFR: 38 mL/min/{1.73_m2} — ABNORMAL LOW (ref >=60)

## 2022-06-07 LAB — CBC WITH DIFFERENTIAL/PLATELET
Absolute Monocytes: 902 cells/uL (ref 200–950)
Basophils Absolute: 55 cells/uL (ref 0–200)
Basophils Relative: 0.5 %
Eosinophils Absolute: 121 cells/uL (ref 15–500)
Eosinophils Relative: 1.1 %
HCT: 37 % (ref 35.0–45.0)
Hemoglobin: 12.2 g/dL (ref 11.7–15.5)
Lymphs Abs: 1188 cells/uL (ref 850–3900)
MCH: 27.3 pg (ref 27.0–33.0)
MCHC: 33 g/dL (ref 32.0–36.0)
MCV: 82.8 fL (ref 80.0–100.0)
MPV: 9 fL (ref 7.5–12.5)
Monocytes Relative: 8.2 %
Neutro Abs: 8734 cells/uL — ABNORMAL HIGH (ref 1500–7800)
Neutrophils Relative %: 79.4 %
Platelets: 503 10*3/uL — ABNORMAL HIGH (ref 140–400)
RBC: 4.47 10*6/uL (ref 3.80–5.10)
RDW: 14.5 % (ref 11.0–15.0)
Total Lymphocyte: 10.8 %
WBC: 11 10*3/uL — ABNORMAL HIGH (ref 3.8–10.8)

## 2022-06-08 ENCOUNTER — Other Ambulatory Visit: Payer: Self-pay | Admitting: Cardiology

## 2022-06-09 DIAGNOSIS — Z952 Presence of prosthetic heart valve: Secondary | ICD-10-CM

## 2022-06-09 NOTE — Progress Notes (Signed)
Daily Session Note  Patient Details  Name: SIMISOLA SANDLES MRN: [id number] Date of Birth: May 15, 1951 Referring Provider:   Flowsheet Row Cardiac Rehab from 05/12/2022 in [facility] Cardiac and Pulmonary Rehab  Referring Provider Eleonore Chiquito MD       Encounter Date: 06/09/2022  Check In:  Session Check In - 06/09/22 0903       Check-In   Supervising physician immediately available to respond to emergencies See telemetry face sheet for immediately available ER MD    Location [facility]-Cardiac & Pulmonary Rehab    Staff Present Justin Mend, RCP,RRT,BSRT;Marita Burnsed, MPA, RN;Melissa [place], RDN, LDN;Jessica [place], MA, RCEP, CCRP, CCET    Virtual Visit No    Medication changes reported     No    Fall or balance concerns reported    No    Tobacco Cessation No Change    Warm-up and Cool-down Performed on first and last piece of equipment    Resistance Training Performed Yes    VAD Patient? No    PAD/SET Patient? No      Pain Assessment   Currently in Pain? No/denies                Social History   Tobacco Use  Smoking Status Never  Smokeless Tobacco Never    Goals Met:  Independence with exercise equipment Exercise tolerated well No report of concerns or symptoms today Strength training completed today  Goals Unmet:  Not Applicable  Comments: Pt able to follow exercise prescription today without complaint.  Will continue to monitor for progression.    Dr. Emily Filbert is Medical Director for [facility].  Dr. Ottie Glazier is Medical Director for [facility] Pulmonary Rehabilitation.

## 2022-06-13 ENCOUNTER — Encounter: Payer: Medicare PPO | Attending: Cardiovascular Disease | Admitting: *Deleted

## 2022-06-13 DIAGNOSIS — Z48812 Encounter for surgical aftercare following surgery on the circulatory system: Secondary | ICD-10-CM | POA: Diagnosis not present

## 2022-06-13 DIAGNOSIS — Z952 Presence of prosthetic heart valve: Secondary | ICD-10-CM

## 2022-06-13 NOTE — Progress Notes (Signed)
Daily Session Note  Patient Details  Name: Patricia [name] MRN: [id number] Date of Birth: [birth year] Referring Provider:   Flowsheet Row Cardiac Rehab from 05/12/2022 in [facility] Cardiac and Pulmonary Rehab  Referring Provider Eleonore Chiquito MD       Encounter Date: 06/13/2022  Check In:  Session Check In - 06/13/22 1007       Check-In   Supervising physician immediately available to respond to emergencies See telemetry face sheet for immediately available ER MD    Location [facility]-Cardiac & Pulmonary Rehab    Staff Present Heath Lark, RN, BSN, CCRP;Laureen Owens [place], BS, RRT, CPFT;Joseph [place], [place]    Medication changes reported     No    Fall or balance concerns reported    No    Warm-up and Cool-down Performed on first and last piece of equipment    Resistance Training Performed Yes    VAD Patient? No    PAD/SET Patient? No      Pain Assessment   Currently in Pain? No/denies                Social History   Tobacco Use  Smoking Status Never  Smokeless Tobacco Never    Goals Met:  Independence with exercise equipment Exercise tolerated well No report of concerns or symptoms today  Goals Unmet:  Not Applicable  Comments: Pt able to follow exercise prescription today without complaint.  Will continue to monitor for progression.    Dr. Emily Filbert is Medical Director for [place].  Dr. Ottie Glazier is Medical Director for [facility] Pulmonary Rehabilitation.

## 2022-06-16 DIAGNOSIS — Z952 Presence of prosthetic heart valve: Secondary | ICD-10-CM

## 2022-06-16 DIAGNOSIS — Z48812 Encounter for surgical aftercare following surgery on the circulatory system: Secondary | ICD-10-CM | POA: Diagnosis not present

## 2022-06-16 NOTE — Progress Notes (Signed)
Daily Session Note  Patient Details  Name: Patricia [name] MRN: [id number] Date of Birth: 1951/11/20 Referring Provider:   Flowsheet Row Cardiac Rehab from 05/12/2022 in [facility] Cardiac and Pulmonary Rehab  Referring Provider Eleonore Chiquito MD       Encounter Date: 06/16/2022  Check In:  Session Check In - 06/16/22 0919       Check-In   Supervising physician immediately available to respond to emergencies See telemetry face sheet for immediately available ER MD    Location [facility]-Cardiac & Pulmonary Rehab    Staff Present Antionette Fairy, BS, Exercise Physiologist;Joseph [place], RCP,RRT,BSRT;Melissa [place], RDN, Luther Redo, MPA, RN    Virtual Visit No    Medication changes reported     No    Fall or balance concerns reported    No    Tobacco Cessation No Change    Warm-up and Cool-down Performed on first and last piece of equipment    Resistance Training Performed Yes    VAD Patient? No    PAD/SET Patient? No      Pain Assessment   Currently in Pain? No/denies    Multiple Pain Sites No                Social History   Tobacco Use  Smoking Status Never  Smokeless Tobacco Never    Goals Met:  Independence with exercise equipment Exercise tolerated well No report of concerns or symptoms today Strength training completed today  Goals Unmet:  Not Applicable  Comments: Pt able to follow exercise prescription today without complaint.  Will continue to monitor for progression.    Dr. Emily Filbert is Medical Director for [facility].  Dr. Ottie Glazier is Medical Director for [facility] Pulmonary Rehabilitation.

## 2022-06-17 ENCOUNTER — Ambulatory Visit (INDEPENDENT_AMBULATORY_CARE_PROVIDER_SITE_OTHER): Payer: Medicare PPO | Admitting: Internal Medicine

## 2022-06-17 ENCOUNTER — Encounter: Payer: Self-pay | Admitting: Internal Medicine

## 2022-06-17 VITALS — BP 126/66 | HR 84 | Ht 65.5 in | Wt 109.0 lb

## 2022-06-17 DIAGNOSIS — I442 Atrioventricular block, complete: Secondary | ICD-10-CM | POA: Diagnosis not present

## 2022-06-17 DIAGNOSIS — Z950 Presence of cardiac pacemaker: Secondary | ICD-10-CM | POA: Diagnosis not present

## 2022-06-17 NOTE — Progress Notes (Signed)
HPI Patricia [name] follows up today for ongoing evaluation and management of her PPM, NSVT, remote atrial flutter s/p ablation, and prior MV replacement. Since she was seen last by Dr. Greggory Brandy a year ago, she had done well until a month or two ago and was found to have worsening CHF symptoms and a 2D echo and then TEE demonstrated surgical AI. She has undergone SAVR with Dr. Cyndia Bent. She has not had syncope. She is maintaining NSR. She has been on coumadin. Allergies  Allergen Reactions   Aspirin Other (See Comments)    Past bleeding ulcer. GI upset   Azithromycin Other (See Comments)    pancreatitis   Penicillins Anaphylaxis, Hives, Swelling and Other (See Comments)    PATIENT HAS HAD A PCN REACTION WITH IMMEDIATE RASH, FACIAL/TONGUE/THROAT SWELLING, SOB, OR LIGHTHEADEDNESS WITH HYPOTENSION:  #  #  #  YES  #  #  #   HAS PT DEVELOPED SEVERE RASH INVOLVING MUCUS MEMBRANES or SKIN NECROSIS: #  #  #  YES  #  #  #  Has patient had a PCN reaction that required hospitalization: No Has patient had a PCN reaction occurring within the last 10 years: No    Sulfa Antibiotics Hives and Rash   Cheese Nausea And Vomiting     Current Outpatient Medications  Medication Sig Dispense Refill   acetaminophen (TYLENOL) 325 MG tablet Take 2 tablets (650 mg total) by mouth 2 (two) times daily as needed for moderate pain or headache.     alendronate (FOSAMAX) 70 MG tablet Take 1 tablet (70 mg total) by mouth once a week. (Patient taking differently: Take 70 mg by mouth once a week. Sunday) 12 tablet 3   amiodarone (PACERONE) 200 MG tablet Take 0.5 tablets (100 mg total) by mouth daily. 45 tablet 3   atorvastatin (LIPITOR) 20 MG tablet Take 1 tablet (20 mg total) by mouth daily. 30 tablet 3   Blood Glucose Monitoring Suppl (ACCU-CHEK GUIDE) w/Device KIT 1 Units by Does not apply route daily. 1 kit 0   Calcium Carbonate (CALCIUM 600 PO) Take 600 mg by mouth 2 (two) times daily. Lunch and Dinner     chlorthalidone  (HYGROTON) 25 MG tablet TAKE 1 TABLET BY MOUTH ONCE A DAY 90 tablet 1   CREON 36000-114000 units CPEP capsule Take 36,000 Units by mouth 3 (three) times daily before meals.     esomeprazole (NEXIUM) 40 MG capsule Take 40 mg by mouth daily before breakfast.   12   fluticasone (FLONASE) 50 MCG/ACT nasal spray Place 2 sprays into both nostrils daily. (Patient taking differently: Place 2 sprays into both nostrils daily as needed for rhinitis.) 16 g 6   glucose blood (ACCU-CHEK GUIDE) test strip USE TO TEST BLOOD SUGARS EVERY MORNING 100 each 3   Lancets (ACCU-CHEK SOFT TOUCH) lancets Check BS QAM 100 each 3   Lancets Misc. (ACCU-CHEK SOFTCLIX LANCET DEV) KIT Check BS QAM 1 kit 0   levothyroxine (SYNTHROID) 50 MCG tablet TAKE 1 TABLET BY MOUTH ONCE A DAY (Patient taking differently: Take 50 mcg by mouth daily before breakfast.) 90 tablet 3   metoprolol tartrate (LOPRESSOR) 50 MG tablet Take 1 tablet (50 mg total) by mouth 2 (two) times daily. 60 tablet 1   Multiple Vitamin (MULTIVITAMIN WITH MINERALS) TABS tablet Take 1 tablet by mouth daily with lunch. Centrum Silver.     potassium chloride (KLOR-CON) 20 MEQ packet Take 20 mEq by mouth daily. 30 packet  0   potassium chloride SA (KLOR-CON M) 20 MEQ tablet TAKE 1 TABLET BY MOUTH ONCE A DAY 30 tablet 10   torsemide 40 MG TABS Take 40 mg by mouth daily. 30 tablet 1   traMADol (ULTRAM) 50 MG tablet Take 1 tablet (50 mg total) by mouth every 6 (six) hours as needed for moderate pain. 28 tablet 0   warfarin (COUMADIN) 5 MG tablet Take 1 tablet (5 mg total) by mouth daily. (Patient taking differently: Take 2.5-5 mg by mouth See admin instructions. 5 mg on Tuesday,Thursday,Saturday,Sunday 2.5 mg on Monday,Wednesday,friday) 30 tablet 3   No current facility-administered medications for this visit.     Past Medical History:  Diagnosis Date   Atrial flutter ([place])    Ablated 1998 Dr. Caryl Comes   Basal cell carcinoma 06/05/2019   R low back, EDC 07/16/19    Basal cell carcinoma 04/15/2010   Right post shoulder   Basal cell carcinoma 04/15/2010   Mid back   Basal cell carcinoma (BCC) 09/29/2016   Left post shoulder. Superficial and nodular.   BCC (basal cell carcinoma) 07/21/2021   right mid back, [facility] 09/15/2021   BCC (basal cell carcinoma) 01/27/2022   left lower abdomen   Bleeding gastric ulcer    back in 2000   Breast cancer ([place])    COPD (chronic obstructive pulmonary disease) ([place])    from radiation   GERD (gastroesophageal reflux disease)    History of basal cell carcinoma (BCC) 03/22/2021   right neck and right medial ear, Moh's   Hodgkin's lymphoma ([place])    Treated with radiation and chemo   Mitral regurgitation    pvc   Osteoporosis    lumbar verterbral fracture, left ankle fracture, dexa 2015   Pancreatitis    Pneumonia    Prediabetes    Presence of permanent cardiac pacemaker    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 09/13/2017   25 mm Medtronic Mosaic porcine stented bioprosthetic tissue valve   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   Past Surgical History:  Procedure Laterality Date   AORTIC VALVE REPLACEMENT N/A 04/07/2022   Procedure: AORTIC VALVE REPLACEMENT (AVR), USING INSPIRIS 19 MM AORTIC VALVE;  Surgeon: [name], MD;  Location: [place];  Service: Open Heart Surgery;  Laterality: N/A;   BREAST SURGERY     breast cancer since 2005   left mastectomy   BUBBLE STUDY  01/12/2022   Procedure: BUBBLE STUDY;  Surgeon: Geralynn Rile, MD;  Location: [place];  Service: Cardiovascular;;   CARDIAC ELECTROPHYSIOLOGY [place] REPLACEMENT N/A    [employer] 03/19/2021   CATARACT EXTRACTION, BILATERAL     COLONOSCOPY     IR RADIOLOGY PERIPHERAL GUIDED IV START  08/03/2017   IR THORACENTESIS ASP PLEURAL SPACE W/IMG GUIDE   05/23/2022   IR THORACENTESIS ASP PLEURAL SPACE W/IMG GUIDE  05/24/2022   IR US GUIDE VASC ACCESS RIGHT  08/03/2017   LAPAROTOMY     MASTECTOMY  2005   Left-axillary   MITRAL VALVE REPAIR Right 09/13/2017   Procedure: MINIMALLY INVASIVE MITRAL VALVE REPLACEMENT (MVR) with Medtronic Mosaic Porcine Heart Valve size 42mm;  Surgeon: Rexene Alberts, MD;  Location: [place];  Service: Open Heart Surgery;  Laterality: Right;   ORIF ANKLE FRACTURE Left  03/21/2014   Procedure: LEFT ANKLE FRACTURE OPEN TREATMENT BILMALLEOLAR INCLUDES INTERNAL FIXATION ;  Surgeon: Renette Butters, MD;  Location: [place];  Service: Orthopedics;  Laterality: Left;   PACEMAKER IMPLANT N/A 09/19/2017   Medtronic Azure XT MRI conditional dual-chamber pacemaker for symptomatic complete heart block  by Dr Rayann Heman   RIGHT/LEFT HEART CATH AND CORONARY ANGIOGRAPHY N/A 06/27/2017   Procedure: Right/Left Heart Cath and Coronary Angiography;  Surgeon: Larey [name], MD;  Location: [place] [facility];  Service: Cardiovascular;  Laterality: N/A;   SPLENECTOMY  1974   hodgkins disease   TEE WITHOUT CARDIOVERSION  06/12/2012   Procedure: TRANSESOPHAGEAL ECHOCARDIOGRAM (TEE);  Surgeon: Larey [name], MD;  Location: [place];  Service: Cardiovascular;  Laterality: N/A;   TEE WITHOUT CARDIOVERSION N/A 06/29/2017   Procedure: TRANSESOPHAGEAL ECHOCARDIOGRAM (TEE);  Surgeon: Jerline Pain, MD;  [facility] END[facility]COPY;  Service: Cardiovascular;  Laterality: N/A;   TEE WITHOUT CARDIOVERSION N/A 09/13/2017   Procedure: TRANSESOPHAGEAL ECHOCARDIOGRAM (TEE);  Surgeon: Rexene Alberts, MD;  Location: [place];  Service: Open Heart Surgery;  Laterality: N/A;   TEE WITHOUT CARDIOVERSION N/A 01/12/2022   Procedure: TRANSESOPHAGEAL ECHOCARDIOGRAM (TEE);  Surgeon: Geralynn Rile, MD;  Location: [place];  Service: Cardiovascular;  Laterality: N/A;   TEE WITHOUT CARDIOVERSION N/A 04/07/2022   Procedure: TRANSESOPHAGEAL  ECHOCARDIOGRAM (TEE);  Surgeon: [name], MD;  Location: [place];  Service: Open Heart Surgery;  Laterality: N/A;   TONSILLECTOMY AND ADENOIDECTOMY       Family History  Problem Relation Age of Onset   Rheumatic fever Father        Died suddenly age 83   Diabetes Father    Cancer Father        colon   Heart disease Father        rheumatic fever x 3   Arthritis Maternal Grandmother    Hypertension Maternal Grandmother    Diabetes Paternal Grandmother    Vision loss Paternal Grandmother        glaucoma     Social History   Socioeconomic History   Marital status: Widowed    Spouse name: Not on file   Number of children: Not on file   Years of education: Not on file   Highest education level: Master's degree (e.g., MA, MS, MEng, MEd, MSW, MBA)  Occupational History   Occupation: Pharmacist, hospital (retired)  Tobacco Use   Smoking status: Never   Smokeless tobacco: Never  Vaping Use   Vaping Use: Never used  Substance and Sexual Activity   Alcohol use: No   Drug use: No   Sexual activity: Yes  Other Topics Concern   Not on file  Social History Narrative   Husband passed away from [place] in 2017/08/22.   Social Determinants of Health   Financial Resource Strain: Low Risk  (03/22/2021)   Overall Financial Resource Strain (CARDIA)    Difficulty of Paying Living Expenses: Not hard at all  Food Insecurity: No Food Insecurity (03/22/2021)   Hunger Vital Sign    Worried About Running Out of Food in the Last Year: Never true    Ran Out of Food in the Last Year: Never true  Transportation Needs: No Transportation Needs (03/22/2021)   PRAPARE - Hydrologist (Medical): No    Lack of Transportation (Non-Medical): No  Physical Activity: Sufficiently Active (03/22/2021)   Exercise Vital Sign    Days of Exercise per Week: 7 days  Minutes of Exercise per Session: 30 min  Stress: No Stress Concern Present (03/22/2021)   [place]    Feeling of Stress : Not at all  Social Connections: Moderately Integrated (03/22/2021)   Social Connection and Isolation Panel [NHANES]    Frequency of Communication with Friends and Family: More than three times a week    Frequency of Social Gatherings with Friends and Family: Three times a week    Attends Religious Services: More than 4 times per year    Active Member of Clubs or Organizations: Yes    Attends Archivist Meetings: More than 4 times per year    Marital Status: Widowed  Intimate Partner Violence: Not At Risk (03/22/2021)   Humiliation, Afraid, Rape, and Kick questionnaire    Fear of Current or Ex-Partner: No    Emotionally Abused: No    Physically Abused: No    Sexually Abused: No     BP 126/66   Pulse 84   Ht 5' 5.5" (1.664 m)   Wt 109 lb (49.4 kg)   SpO2 98%   BMI 17.86 kg/m   Physical Exam:  Well appearing NAD HEENT: Unremarkable Neck:  No JVD, no thyromegally Lymphatics:  No adenopathy Back:  No CVA tenderness Lungs:  Clear with no wheezes HEART:  Regular rate rhythm, no murmurs, no rubs, no clicks Abd:  soft, positive bowel sounds, no organomegally, no rebound, no guarding Ext:  2 plus pulses, no edema, no cyanosis, no clubbing Skin:  No rashes no nodules Neuro:  CN II through XII intact, motor grossly intact  DEVICE  Normal device function.  See PaceArt for details.   Assess/Plan:  1. Symptomatic complete heart block. Normal pacemaker function. See Claudia Desanctis Art report. No changes today she is device dependant today.   2. Paroxysmal atrial fibrillation. no afib in several years. She has been placed on coumadin. She will continue low dose amiodarone.    3. HTN Stable. No change required today   4. S/p MVR Follows with Dr Percival Spanish   5. NSVT Noted on PPM at 150 bpm. Echo 10/09/20 reviewed,  EF is normal. Asymptomatic On metoprolol  6. AI - she is s/p SAVR.      Carleene Overlie Mathayus Stanbery,MD

## 2022-06-17 NOTE — Patient Instructions (Signed)
Medication Instructions:  Your physician recommends that you continue on your current medications as directed. Please refer to the Current Medication list given to you today.  *If you need a refill on your cardiac medications before your next appointment, please call your pharmacy*  Lab Work: None ordered.  If you have labs (blood work) drawn today and your tests are completely normal, you will receive your results only by: [place] (if you have MyChart) OR A paper copy in the mail If you have any lab test that is abnormal or we need to change your treatment, we will call you to review the results.  Testing/Procedures: None ordered.  Follow-Up: At [facility], you and your health needs are our priority.  As part of our continuing mission to provide you with exceptional heart care, we have created designated Provider Care Teams.  These Care Teams include your primary Cardiologist (physician) and Advanced Practice Providers (APPs -  Physician Assistants and Nurse Practitioners) who all work together to provide you with the care you need, when you need it.  We recommend signing up for the patient portal called "MyChart".  Sign up information is provided on this After Visit Summary.  MyChart is used to connect with patients for Virtual Visits (Telemedicine).  Patients are able to view lab/test results, encounter notes, upcoming appointments, etc.  Non-urgent messages can be sent to your provider as well.   To learn more about what you can do with MyChart, go to [email or web address].    Your next appointment:   1 year(s)  The format for your next appointment:   In Person  Provider:   Cristopher Peru, MD{or one of the following Advanced Practice Providers on your designated Care Team:   Tommye Standard, [place] Legrand Como "Jonni Sanger" Chalmers Cater, [place]  Remote monitoring is used to monitor your Pacemaker from home. This monitoring reduces the number of office visits required to check your device to  one time per year. It allows [place] to keep an eye on the functioning of your device to ensure it is working properly. You are scheduled for a device check from home on 06/28/2022. You may send your transmission at any time that day. If you have a wireless device, the transmission will be sent automatically. After your physician reviews your transmission, you will receive a postcard with your next transmission date.  Important Information About Sugar

## 2022-06-21 ENCOUNTER — Other Ambulatory Visit: Payer: Self-pay | Admitting: Surgery

## 2022-06-21 DIAGNOSIS — Z9889 Other specified postprocedural states: Secondary | ICD-10-CM

## 2022-06-22 ENCOUNTER — Encounter: Payer: Self-pay | Admitting: *Deleted

## 2022-06-22 DIAGNOSIS — Z952 Presence of prosthetic heart valve: Secondary | ICD-10-CM

## 2022-06-22 NOTE — Progress Notes (Signed)
Cardiac Individual Treatment Plan  Patient Details  Name: Patricia [name] MRN: [id number] Date of Birth: 1951/02/22 Referring Provider:   Flowsheet Row Cardiac Rehab from 05/12/2022 in [facility] Cardiac and Pulmonary Rehab  Referring Provider Eleonore Chiquito MD       Initial Encounter Date:  Flowsheet Row Cardiac Rehab from 05/12/2022 in [facility] Cardiac and Pulmonary Rehab  Date 05/12/22       Visit Diagnosis: S/P aortic valve replacement  Patient's Home Medications on Admission:  Current Outpatient Medications:    acetaminophen (TYLENOL) 325 MG tablet, Take 2 tablets (650 mg total) by mouth 2 (two) times daily as needed for moderate pain or headache., Disp: , Rfl:    alendronate (FOSAMAX) 70 MG tablet, Take 1 tablet (70 mg total) by mouth once a week. (Patient taking differently: Take 70 mg by mouth once a week. Sunday), Disp: 12 tablet, Rfl: 3   amiodarone (PACERONE) 200 MG tablet, Take 0.5 tablets (100 mg total) by mouth daily., Disp: 45 tablet, Rfl: 3   atorvastatin (LIPITOR) 20 MG tablet, Take 1 tablet (20 mg total) by mouth daily., Disp: 30 tablet, Rfl: 3   Blood Glucose Monitoring Suppl (ACCU-CHEK GUIDE) w/Device KIT, 1 Units by Does not apply route daily., Disp: 1 kit, Rfl: 0   Calcium Carbonate (CALCIUM 600 PO), Take 600 mg by mouth 2 (two) times daily. Lunch and Dinner, Disp: , Rfl:    chlorthalidone (HYGROTON) 25 MG tablet, TAKE 1 TABLET BY MOUTH ONCE A DAY, Disp: 90 tablet, Rfl: 1   CREON 36000-114000 units CPEP capsule, Take 36,000 Units by mouth 3 (three) times daily before meals., Disp: , Rfl:    esomeprazole (NEXIUM) 40 MG capsule, Take 40 mg by mouth daily before breakfast. , Disp: , Rfl: 12   fluticasone (FLONASE) 50 MCG/ACT nasal spray, Place 2 sprays into both nostrils daily. (Patient taking differently: Place 2 sprays into both nostrils daily as needed for rhinitis.), Disp: 16 g, Rfl: 6   glucose blood (ACCU-CHEK GUIDE) test strip, USE TO TEST BLOOD SUGARS EVERY MORNING,  Disp: 100 each, Rfl: 3   Lancets (ACCU-CHEK SOFT TOUCH) lancets, Check BS QAM, Disp: 100 each, Rfl: 3   Lancets Misc. (ACCU-CHEK SOFTCLIX LANCET DEV) KIT, Check BS QAM, Disp: 1 kit, Rfl: 0   levothyroxine (SYNTHROID) 50 MCG tablet, TAKE 1 TABLET BY MOUTH ONCE A DAY (Patient taking differently: Take 50 mcg by mouth daily before breakfast.), Disp: 90 tablet, Rfl: 3   metoprolol tartrate (LOPRESSOR) 50 MG tablet, Take 1 tablet (50 mg total) by mouth 2 (two) times daily., Disp: 60 tablet, Rfl: 1   Multiple Vitamin (MULTIVITAMIN WITH MINERALS) TABS tablet, Take 1 tablet by mouth daily with lunch. Centrum Silver., Disp: , Rfl:    potassium chloride (KLOR-CON) 20 MEQ packet, Take 20 mEq by mouth daily., Disp: 30 packet, Rfl: 0   potassium chloride SA (KLOR-CON M) 20 MEQ tablet, TAKE 1 TABLET BY MOUTH ONCE A DAY, Disp: 30 tablet, Rfl: 10   torsemide 40 MG TABS, Take 40 mg by mouth daily., Disp: 30 tablet, Rfl: 1   traMADol (ULTRAM) 50 MG tablet, Take 1 tablet (50 mg total) by mouth every 6 (six) hours as needed for moderate pain., Disp: 28 tablet, Rfl: 0   warfarin (COUMADIN) 5 MG tablet, Take 1 tablet (5 mg total) by mouth daily. (Patient taking differently: Take 2.5-5 mg by mouth See admin instructions. 5 mg on Tuesday,Thursday,Saturday,Sunday 2.5 mg on Monday,Wednesday,friday), Disp: 30 tablet, Rfl: 3  Past Medical  History: Past Medical History:  Diagnosis Date   Atrial flutter ([place])    Ablated 1998 Dr. Caryl Comes   Basal cell carcinoma 06/05/2019   R low back, [facility] 07/16/19   Basal cell carcinoma 04/15/2010   Right post shoulder   Basal cell carcinoma 04/15/2010   Mid back   Basal cell carcinoma (BCC) 09/29/2016   Left post shoulder. Superficial and nodular.   BCC (basal cell carcinoma) 07/21/2021   right mid back, [facility] 09/15/2021   BCC (basal cell carcinoma) 01/27/2022   left lower abdomen   Bleeding gastric ulcer    back in 2000   Breast cancer ([place])    COPD (chronic obstructive pulmonary  disease) ([place])    from radiation   GERD (gastroesophageal reflux disease)    History of basal cell carcinoma (BCC) 03/22/2021   right neck and right medial ear, Moh's   Hodgkin's lymphoma ([place])    Treated with radiation and chemo   Mitral regurgitation    pvc   Osteoporosis    lumbar verterbral fracture, left ankle fracture, dexa 2015   Pancreatitis    Pneumonia    Prediabetes    Presence of permanent cardiac pacemaker    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 09/13/2017   25 mm Medtronic Mosaic porcine stented bioprosthetic tissue valve   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   Left medial lower leg   Ulcer     Tobacco Use: Social History   Tobacco Use  Smoking Status Never  Smokeless Tobacco Never    Labs: Review Flowsheet  More data exists      Latest Ref Rng & Units 12/19/2019 03/25/2021 04/05/2022 04/07/2022 04/08/2022  Labs for ITP Cardiac and Pulmonary Rehab  Cholestrol <200 mg/dL - 136  - - -  LDL (calc) mg/dL (calc) - 57  - - -  HDL-C > OR = 50 mg/dL - 62  - - -  Trlycerides <150 mg/dL - 88  - - -  Hemoglobin A1c 4.8 - 5.6 % 6.1  6.3  6.8  - -  PH, Arterial 7.35 - 7.45 - - 7.45  7.482  7.457  7.396  7.416  7.401  7.472  7.540  7.406  7.446  7.368  7.520   PCO2 arterial 32 - 48 mmHg - - 44  34.6  36.5  44.5  46.1  51.1  43.7  37.8  35.8  45.5  43.6  29.2   Bicarbonate 20.0 - 28.0 mmol/L - - 30.6  25.8  25.9  27.3  29.6  31.7  32.0  32.4  22.5  31.3  24.8  23.7   TCO2 22 - 32 mmol/L - - - _0 33  33  31  33  32  34  24  28  33  32  26  25   Acid-base deficit 0.0 - 2.0 mmol/L - - - 2.0  -  O2 Saturation % - - 98.5  100  100  100  100  100  100  100  81  100  100  99      Exercise Target Goals: Exercise Program Goal: Individual exercise prescription set using results from initial 6 min walk test and THRR while considering  patient's activity barriers and safety.   Exercise Prescription Goal: Initial exercise  prescription builds to 30-45 minutes a day of aerobic activity, 2-3 days per week.  Home exercise guidelines will be  given to patient during program as part of exercise prescription that the participant will acknowledge.   Education: Aerobic Exercise: - Group verbal and visual presentation on the components of exercise prescription. Introduces F.I.T.T principle from ACSM for exercise prescriptions.  Reviews F.I.T.T. principles of aerobic exercise including progression. Written material given at graduation. Flowsheet Row Cardiac Rehab from 06/16/2022 in [facility] Cardiac and Pulmonary Rehab  Date 06/16/22  Educator [facility]  Instruction Review Code 1- Verbalizes Understanding       Education: Resistance Exercise: - Group verbal and visual presentation on the components of exercise prescription. Introduces F.I.T.T principle from ACSM for exercise prescriptions  Reviews F.I.T.T. principles of resistance exercise including progression. Written material given at graduation.    Education: Exercise & Equipment Safety: - Individual verbal instruction and demonstration of equipment use and safety with use of the equipment. Flowsheet Row Cardiac Rehab from 06/16/2022 in [facility] Cardiac and Pulmonary Rehab  Education need identified 05/12/22       Education: Exercise Physiology & General Exercise Guidelines: - Group verbal and written instruction with models to review the exercise physiology of the cardiovascular system and associated critical values. Provides general exercise guidelines with specific guidelines to those with heart or lung disease.  Flowsheet Row Cardiac Rehab from 06/16/2022 in [facility] Cardiac and Pulmonary Rehab  Date 06/09/22  Educator NT  Instruction Review Code 1- [employer] Understanding       Education: Flexibility, Balance, Mind/Body Relaxation: - Group verbal and visual presentation with interactive activity on the components of exercise prescription. Introduces F.I.T.T principle from  ACSM for exercise prescriptions. Reviews F.I.T.T. principles of flexibility and balance exercise training including progression. Also discusses the mind body connection.  Reviews various relaxation techniques to help reduce and manage stress (i.e. Deep breathing, progressive muscle relaxation, and visualization). Balance handout provided to take home. Written material given at graduation.   Activity Barriers & Risk Stratification:  Activity Barriers & Cardiac Risk Stratification - 05/12/22 1108       Activity Barriers & Cardiac Risk Stratification   Activity Barriers Arthritis;Joint Problems;Shortness of Breath;Deconditioning;Muscular Weakness   broke left ankle (plate/screws), bilateral knee arthritis   Cardiac Risk Stratification Moderate             6 Minute Walk:  6 Minute Walk     Row Name 05/12/22 1106         6 Minute Walk   Phase Initial     Distance 878 feet     Walk Time 6 minutes     # of Rest Breaks 0     MPH 1.66     METS 2.63     RPE 15     Perceived Dyspnea  3     VO2 Peak 9.21     Symptoms Yes (comment)     Comments fatigue, SOB     Resting HR 92 bpm     Resting BP 134/62     Resting Oxygen Saturation  98 %     Exercise Oxygen Saturation  during 6 min walk 94 %     Max Ex. HR 110 bpm     Max Ex. BP 154/74     2 Minute Post BP 124/70              Oxygen Initial Assessment:   Oxygen Re-Evaluation:   Oxygen Discharge (Final Oxygen Re-Evaluation):   Initial Exercise Prescription:  Initial Exercise Prescription - 05/12/22 1100       Date of Initial Exercise RX  and Referring Provider   Date 05/12/22    Referring Provider Eleonore Chiquito MD      Oxygen   Maintain Oxygen Saturation 88% or higher      Treadmill   MPH 1.5    Grade 0.5    Minutes 15    METs 2.26      Recumbant Bike   Level 1    RPM 50    Watts 10    Minutes 15    METs 2      NuStep   Level 1    SPM 70    Minutes 15    METs 1.5      Biostep-RELP   Level 1     SPM 50    Minutes 15    METs 1.8      Track   Laps 15    Minutes 15    METs 1.82      Prescription Details   Frequency (times per week) 3    Duration Progress to 30 minutes of continuous aerobic without signs/symptoms of physical distress      Intensity   THRR 40-80% of Max Heartrate 114-138    Ratings of Perceived Exertion 11-13    Perceived Dyspnea 0-4      Progression   Progression Continue to progress workloads to maintain intensity without signs/symptoms of physical distress.      Resistance Training   Training Prescription Yes    Weight 3 lb    Reps 10-15             Perform Capillary Blood Glucose checks as needed.  Exercise Prescription Changes:   Exercise Prescription Changes     Row Name 05/12/22 1100 05/24/22 0800 06/06/22 0900 06/20/22 1400       Response to Exercise   Blood Pressure (Admit) 134/62 102/60 112/60 102/58    Blood Pressure (Exercise) 154/74 118/64 118/60 104/60    Blood Pressure (Exit) 124/70 112/66 96/60 124/70    Heart Rate (Admit) 92 bpm 86 bpm 85 bpm 85 bpm    Heart Rate (Exercise) 110 bpm 95 bpm 96 bpm 93 bpm    Heart Rate (Exit) 103 bpm 89 bpm 94 bpm 87 bpm    Oxygen Saturation (Admit) 98 % -- -- --    Oxygen Saturation (Exercise) 94 % -- -- --    Oxygen Saturation (Exit) 98 % -- -- --    Rating of Perceived Exertion (Exercise) _0 Perceived Dyspnea (Exercise) 3 -- -- --    Symptoms fatigue, SOB none none none    Comments 6MWT third full day of exercise -- --    Duration Progress to 30 minutes of  aerobic without signs/symptoms of physical distress Progress to 30 minutes of  aerobic without signs/symptoms of physical distress Progress to 30 minutes of  aerobic without signs/symptoms of physical distress Continue with 30 min of aerobic exercise without signs/symptoms of physical distress.    Intensity THRR unchanged THRR unchanged THRR unchanged THRR unchanged      Progression   Progression Continue to progress  workloads to maintain intensity without signs/symptoms of physical distress. Continue to progress workloads to maintain intensity without signs/symptoms of physical distress. Continue to progress workloads to maintain intensity without signs/symptoms of physical distress. Continue to progress workloads to maintain intensity without signs/symptoms of physical distress.    Average METs 2.63 2.34 2.27 2.56      Resistance Training   Training Prescription -- Yes  Yes Yes    Weight -- 1 lb 2 lb 2 lb    Reps -- 10-15 10-15 10-15      Interval Training   Interval Training -- No No No      Recumbant Bike   Level -- 1 1 --    Watts -- 12 16 --    Minutes -- 15 15 --    METs -- 2.94 3.05 --      NuStep   Level -- _0 Minutes -- _1 METs -- 2.1 2.1 2.5      Biostep-RELP   Level -- -- 1 --    Minutes -- -- 15 --    METs -- -- 2 --      Track   Laps -- _2 Minutes -- _3 METs -- 2.31 2 2.63      Home Exercise Plan   Plans to continue exercise at -- -- -- Home (comment)  walking, recumbent bike    Frequency -- -- -- Add 3 additional days to program exercise sessions.    Initial Home Exercises Provided -- -- -- 06/08/22      Oxygen   Maintain Oxygen Saturation -- 88% or higher 88% or higher 88% or higher             Exercise Comments:   Exercise Comments     Row Name 05/16/22 0926           Exercise Comments First full day of exercise!  Patient was oriented to gym and equipment including functions, settings, policies, and procedures.  Patient's individual exercise prescription and treatment plan were reviewed.  All starting workloads were established based on the results of the 6 minute walk test done at initial orientation visit.  The plan for exercise progression was also introduced and progression will be customized based on patient's performance and goals.                Exercise Goals and Review:   Exercise Goals     Row Name  05/12/22 1126             Exercise Goals   Increase Physical Activity Yes       Intervention Provide advice, education, support and counseling about physical activity/exercise needs.;Develop an individualized exercise prescription for aerobic and resistive training based on initial evaluation findings, risk stratification, comorbidities and participant's personal goals.       Expected Outcomes Short Term: Attend rehab on a regular basis to increase amount of physical activity.;Long Term: Add in home exercise to make exercise part of routine and to increase amount of physical activity.;Long Term: Exercising regularly at least 3-5 days a week.       Increase Strength and Stamina Yes       Intervention Provide advice, education, support and counseling about physical activity/exercise needs.;Develop an individualized exercise prescription for aerobic and resistive training based on initial evaluation findings, risk stratification, comorbidities and participant's personal goals.       Expected Outcomes Short Term: Increase workloads from initial exercise prescription for resistance, speed, and METs.;Short Term: Perform resistance training exercises routinely during rehab and add in resistance training at home;Long Term: Improve cardiorespiratory fitness, muscular endurance and strength as measured by increased METs and functional capacity (6MWT)       Able to understand and use rate of perceived exertion (RPE) scale Yes  Intervention Provide education and explanation on how to use RPE scale       Expected Outcomes Short Term: Able to use RPE daily in rehab to express subjective intensity level;Long Term:  Able to use RPE to guide intensity level when exercising independently       Able to understand and use Dyspnea scale Yes       Intervention Provide education and explanation on how to use Dyspnea scale       Expected Outcomes Short Term: Able to use Dyspnea scale daily in rehab to express  subjective sense of shortness of breath during exertion;Long Term: Able to use Dyspnea scale to guide intensity level when exercising independently       Knowledge and understanding of Target Heart Rate Range (THRR) Yes       Intervention Provide education and explanation of THRR including how the numbers were predicted and where they are located for reference       Expected Outcomes Short Term: Able to state/look up THRR;Long Term: Able to use THRR to govern intensity when exercising independently;Short Term: Able to use daily as guideline for intensity in rehab       Able to check pulse independently Yes       Intervention Provide education and demonstration on how to check pulse in carotid and radial arteries.;Review the importance of being able to check your own pulse for safety during independent exercise       Expected Outcomes Short Term: Able to explain why pulse checking is important during independent exercise;Long Term: Able to check pulse independently and accurately       Understanding of Exercise Prescription Yes       Intervention Provide education, explanation, and written materials on patient's individual exercise prescription       Expected Outcomes Short Term: Able to explain program exercise prescription;Long Term: Able to explain home exercise prescription to exercise independently                Exercise Goals Re-Evaluation :  Exercise Goals Re-Evaluation     Row Name 05/16/22 0926 05/24/22 0826 06/06/22 0940 06/06/22 0952 06/20/22 1446     Exercise Goal Re-Evaluation   Exercise Goals Review Able to understand and use rate of perceived exertion (RPE) scale;Able to understand and use Dyspnea scale;Knowledge and understanding of Target Heart Rate Range (THRR);Understanding of Exercise Prescription Increase Physical Activity;Understanding of Exercise Prescription;Increase Strength and Stamina Increase Physical Activity;Understanding of Exercise Prescription;Increase Strength  and Stamina Increase Physical Activity;Understanding of Exercise Prescription;Increase Strength and Stamina Increase Physical Activity;Understanding of Exercise Prescription;Increase Strength and Stamina   Comments Reviewed RPE and dyspnea scales, THR and program prescription with pt today.  Pt voiced understanding and was given a copy of goals to take home. Wannetta is off to a good start in rehab.  She has completed her first three full days of exercise.  She is already up to 24 laps.  We will continue to monitor her progress. Reviewed home exercise with pt today.  Pt plans to walk, work on her elliptical bike, and use handweights at home for exercise. She is already exercising a couple days outside of rehab. She is going to focus on checking her HR and extending her exercise sessions to minimum of 30 minutes. Reviewed THR, pulse, RPE, sign and symptoms, pulse oximetery and when to call 911 or MD.  Also discussed weather considerations and indoor options.  Pt voiced understanding. Robertha is doing well in rehab. She increased to  28 laps on the track. She also is up to level three on the T4 machine. Shaunice has also improved to use 2 lb hand weights as well. We will continue to monitor her progress in the program. Samariya continues to do well in rehab.  She is up to 30 laps on the track!  She is also continues with level 3 on the NuStep.  We will conitnue to monitor her progress.   Expected Outcomes Short: Use RPE daily to regulate intensity. Long: Follow program prescription in THR. Short: Continue to attend rehab regularly Long: Continue to follow program prescription Short: Start checking HR using pulse ox during exercise Long: Maintain consistent independent exercise at home Short: Continue increase laps on the track. Long: Continue to improve strength and stamina. Short: Try 3 lb weights  Long: conitnue to improve stamina            Discharge Exercise Prescription (Final Exercise Prescription Changes):   Exercise Prescription Changes - 06/20/22 1400       Response to Exercise   Blood Pressure (Admit) 102/58    Blood Pressure (Exercise) 104/60    Blood Pressure (Exit) 124/70    Heart Rate (Admit) 85 bpm    Heart Rate (Exercise) 93 bpm    Heart Rate (Exit) 87 bpm    Rating of Perceived Exertion (Exercise) 13    Symptoms none    Duration Continue with 30 min of aerobic exercise without signs/symptoms of physical distress.    Intensity THRR unchanged      Progression   Progression Continue to progress workloads to maintain intensity without signs/symptoms of physical distress.    Average METs 2.56      Resistance Training   Training Prescription Yes    Weight 2 lb    Reps 10-15      Interval Training   Interval Training No      NuStep   Level 3    Minutes 15    METs 2.5      Track   Laps 30    Minutes 15    METs 2.63      Home Exercise Plan   Plans to continue exercise at Home (comment)   walking, recumbent bike   Frequency Add 3 additional days to program exercise sessions.    Initial Home Exercises Provided 06/08/22      Oxygen   Maintain Oxygen Saturation 88% or higher             Nutrition:  Target Goals: Understanding of nutrition guidelines, daily intake of sodium <1584m, cholesterol <2037m calories 30% from fat and 7% or less from saturated fats, daily to have 5 or more servings of fruits and vegetables.  Education: All About Nutrition: -Group instruction provided by verbal, written material, interactive activities, discussions, models, and posters to present general guidelines for heart healthy nutrition including fat, fiber, MyPlate, the role of sodium in heart healthy nutrition, utilization of the nutrition label, and utilization of this knowledge for meal planning. Follow up email sent as well. Written material given at graduation. [facility]  Education need identified 05/12/22        Biometrics:  Pre Biometrics - 05/12/22 1127       Pre Biometrics   Height 5' 5.1" (1.654 m)    Weight 118 lb 3.2 oz (53.6 kg)    BMI (Calculated) 19.6    Single Leg Stand 3.3 seconds  Nutrition Therapy Plan and Nutrition Goals:  Nutrition Therapy & Goals - 06/02/22 1024       Nutrition Therapy   RD appointment deferred Yes   Pt would not like to meet with RD at this time. Will continue to follow up.     Personal Nutrition Goals   Nutrition Goal Pt would not like to meet with RD at this time. Will continue to follow up.             Nutrition Assessments:  MEDIFICTS Score Key: ?70 Need to make dietary changes  40-70 Heart Healthy Diet ? 40 Therapeutic Level Cholesterol Diet  Flowsheet Row Cardiac Rehab from 05/12/2022 in [facility] Cardiac and Pulmonary Rehab  Picture Your Plate Total Score on Admission 76      Picture Your Plate Scores: <52 Unhealthy dietary pattern with much room for improvement. 41-50 Dietary pattern unlikely to meet recommendations for good health and room for improvement. 51-60 More healthful dietary pattern, with some room for improvement.  >60 Healthy dietary pattern, although there may be some specific behaviors that could be improved.    Nutrition Goals Re-Evaluation:   Nutrition Goals Discharge (Final Nutrition Goals Re-Evaluation):   Psychosocial: Target Goals: Acknowledge presence or absence of significant depression and/or stress, maximize coping skills, provide positive support system. Participant is able to verbalize types and ability to use techniques and skills needed for reducing stress and depression.   Education: Stress, Anxiety, and Depression - Group verbal and visual presentation to define topics covered.  Reviews how body is impacted by stress, anxiety, and depression.  Also discusses healthy ways to reduce stress and to treat/manage anxiety and depression.  Written material given at graduation. Flowsheet  Row Cardiac Rehab from 06/16/2022 in [facility] Cardiac and Pulmonary Rehab  Date 06/02/22  Educator [facility]  Instruction Review Code 1- [employer] Understanding       Education: Sleep Hygiene -Provides group verbal and written instruction about how sleep can affect your health.  Define sleep hygiene, discuss sleep cycles and impact of sleep habits. Review good sleep hygiene tips.    Initial Review & Psychosocial Screening:  Initial Psych Review & Screening - 05/04/22 0943       Initial Review   Current issues with None Identified      Family Dynamics   Good Support System? Yes   friends and neighbors   Comments lost husband 08/20/2017 from pancreatic cancer       Barriers   Psychosocial barriers to participate in program There are no identifiable barriers or psychosocial needs.      Screening Interventions   Interventions Encouraged to exercise;To provide support and resources with identified psychosocial needs;Provide feedback about the scores to participant    Expected Outcomes Short Term goal: Utilizing psychosocial counselor, staff and physician to assist with identification of specific Stressors or current issues interfering with healing process. Setting desired goal for each stressor or current issue identified.;Long Term Goal: Stressors or current issues are controlled or eliminated.;Short Term goal: Identification and review with participant of any Quality of Life or Depression concerns found by scoring the questionnaire.;Long Term goal: The participant improves quality of Life and PHQ9 Scores as seen by post scores and/or verbalization of changes             Quality of Life Scores:   Scores of 19 and below usually indicate a poorer quality of life in these areas.  A difference of  2-3 points is a clinically meaningful difference.  A  difference of 2-3 points in the total score of the Quality of Life Index has been associated with significant improvement in overall quality of life,  self-image, physical symptoms, and general health in studies assessing change in quality of life.  PHQ-9: Review Flowsheet  More data exists      05/12/2022 03/22/2021 12/19/2019 02/09/2018 01/23/2018  Depression screen PHQ 2/9  Decreased Interest 0 0 0 0 1  Down, Depressed, Hopeless 0 0 0 0 1  PHQ - 2 Score 0 0 0 0 2  Altered sleeping 1 - - - 2  Tired, decreased energy 3 - - - 1  Change in appetite 3 - - - 0  Feeling bad or failure about yourself  0 - - - 0  Trouble concentrating 0 - - - 0  Moving slowly or fidgety/restless 1 - - - 0  Suicidal thoughts 0 - - - 0  PHQ-9 Score 8 - - - 5  Difficult doing work/chores Somewhat difficult - - - Not difficult at all   Interpretation of Total Score  Total Score Depression Severity:  1-4 = Minimal depression, 5-9 = Mild depression, 10-14 = Moderate depression, 15-19 = Moderately severe depression, 20-27 = Severe depression   Psychosocial Evaluation and Intervention:  Psychosocial Evaluation - 05/04/22 0954       Psychosocial Evaluation & Interventions   Interventions Encouraged to exercise with the program and follow exercise prescription    Comments Quantina has no barriers to attending the program. [facility] is ready to start the program and work on her strength and stamina and learn about caring for herself. She has completed CR in the past. She is a widow and has friends and neighbors that check on her daily. 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    Expected Outcomes STG Jashawna will attend all scheduled sessions, she will gain back her strength and stamina to get back to her live and hobby of traveling. LTG Bhavana will continue to progress after discharge    Continue Psychosocial Services  Follow up required by staff             Psychosocial Re-Evaluation:  Psychosocial Re-Evaluation     [place] Name 06/06/22 [phone]             Psychosocial Re-Evaluation   Current issues with None Identified        Comments Aiyanah is doing well mentally. She has great support from family and friends. Not  on any medications for depression or anxiety. She is recovering from fluid gain and a thoracentesis in hospital and feels so much better with resolved SOB. She is going to [facility] in a couple weeks and is excited to spend time there with friends. She plans to bring her weights and exercise there.       Expected Outcomes Short: Have fun on vacation Long: Continue to maintain positive attitude       Interventions Encouraged to attend Cardiac Rehabilitation for the exercise       Continue Psychosocial Services  Follow up required by staff                Psychosocial Discharge (Final Psychosocial Re-Evaluation):  Psychosocial Re-Evaluation - 06/06/22 0952       Psychosocial Re-Evaluation   Current issues with None Identified    Comments Shayona is doing well mentally. She has great support from family and friends. Not  on any medications for depression or  anxiety. She is recovering from fluid gain and a thoracentesis in hospital and feels so much better with resolved SOB. She is going to [facility] in a couple weeks and is excited to spend time there with friends. She plans to bring her weights and exercise there.    Expected Outcomes Short: Have fun on vacation Long: Continue to maintain positive attitude    Interventions Encouraged to attend Cardiac Rehabilitation for the exercise    Continue Psychosocial Services  Follow up required by staff             Vocational Rehabilitation: Provide vocational rehab assistance to qualifying candidates.   Vocational Rehab Evaluation & Intervention:   Education: Education Goals: Education classes will be provided on a variety of topics geared toward better understanding of heart health and risk factor modification. Participant will state understanding/return demonstration of topics presented as noted by education test scores.  Learning  Barriers/Preferences:   General Cardiac Education Topics:  AED/CPR: - Group verbal and written instruction with the use of models to demonstrate the basic use of the AED with the basic ABC's of resuscitation.   Anatomy and Cardiac Procedures: - Group verbal and visual presentation and models provide information about basic cardiac anatomy and function. Reviews the testing methods done to diagnose heart disease and the outcomes of the test results. Describes the treatment choices: Medical Management, Angioplasty, or Coronary Bypass Surgery for treating various heart conditions including Myocardial Infarction, Angina, Valve Disease, and Cardiac Arrhythmias.  Written material given at graduation. Flowsheet Row Cardiac Rehab from 06/16/2022 in [facility] Cardiac and Pulmonary Rehab  Education need identified 05/12/22       Medication Safety: - Group verbal and visual instruction to review commonly prescribed medications for heart and lung disease. Reviews the medication, class of the drug, and side effects. Includes the steps to properly store meds and maintain the prescription regimen.  Written material given at graduation.   Intimacy: - Group verbal instruction through game format to discuss how heart and lung disease can affect sexual intimacy. Written material given at graduation.. Flowsheet Row Cardiac Rehab from 06/16/2022 in [facility] Cardiac and Pulmonary Rehab  Date 06/16/22  Educator [facility]  Instruction Review Code 1- Verbalizes Understanding       Know Your Numbers and Heart Failure: - Group verbal and visual instruction to discuss disease risk factors for cardiac and pulmonary disease and treatment options.  Reviews associated critical values for Overweight/Obesity, Hypertension, Cholesterol, and Diabetes.  Discusses basics of heart failure: signs/symptoms and treatments.  Introduces Heart Failure Zone chart for action plan for heart failure.  Written material given at graduation. Flowsheet Row  Cardiac Rehab from 06/16/2022 in [facility] Cardiac and Pulmonary Rehab  Date 05/19/22  Educator SB  Instruction Review Code 1- Verbalizes Understanding       Infection Prevention: - Provides verbal and written material to individual with discussion of infection control including proper hand washing and proper equipment cleaning during exercise session. Flowsheet Row Cardiac Rehab from 06/16/2022 in [facility] Cardiac and Pulmonary Rehab  Education need identified 05/12/22       Falls Prevention: - Provides verbal and written material to individual with discussion of falls prevention and safety. Flowsheet Row Cardiac Rehab from 06/16/2022 in [facility] Cardiac and Pulmonary Rehab  Date 05/04/22  Educator SB  Instruction Review Code 1- Verbalizes Understanding       Other: -Provides group and verbal instruction on various topics (see comments)   Knowledge Questionnaire Score:   Core Components/Risk  Factors/Patient Goals at Admission:  Personal Goals and Risk Factors at Admission - 05/12/22 1131       Core Components/Risk Factors/Patient Goals on Admission    Weight Management Yes;Weight Gain;Weight Maintenance    Intervention Weight Management: Develop a combined nutrition and exercise program designed to reach desired caloric intake, while maintaining appropriate intake of nutrient and fiber, sodium and fats, and appropriate energy expenditure required for the weight goal.;Weight Management: Provide education and appropriate resources to help participant work on and attain dietary goals.    Admit Weight 118 lb 3.2 oz (53.6 kg)    Goal Weight: Short Term 120 lb (54.4 kg)    Goal Weight: Long Term 120 lb (54.4 kg)    Expected Outcomes Short Term: Continue to assess and modify interventions until short term weight is achieved;Long Term: Adherence to nutrition and physical activity/exercise program aimed toward attainment of established weight goal;Weight Gain: Understanding of general recommendations  for a high calorie, high protein meal plan that promotes weight gain by distributing calorie intake throughout the day with the consumption for 4-5 meals, snacks, and/or supplements;Weight Maintenance: Understanding of the daily nutrition guidelines, which includes 25-35% calories from fat, 7% or less cal from saturated fats, less than $RemoveB'200mg'oxCpnKHe$  cholesterol, less than 1.5gm of sodium, & 5 or more servings of fruits and vegetables daily    Diabetes Yes    Intervention Provide education about signs/symptoms and action to take for hypo/hyperglycemia.;Provide education about proper nutrition, including hydration, and aerobic/resistive exercise prescription along with prescribed medications to achieve blood glucose in normal ranges: Fasting glucose 65-99 mg/dL    Expected Outcomes Short Term: Participant verbalizes understanding of the signs/symptoms and immediate care of hyper/hypoglycemia, proper foot care and importance of medication, aerobic/resistive exercise and nutrition plan for blood glucose control.;Long Term: Attainment of HbA1C < 7%.    Lipids Yes    Intervention Provide education and support for participant on nutrition & aerobic/resistive exercise along with prescribed medications to achieve LDL '70mg'$ , HDL >$Remo'40mg'RiYHg$ .    Expected Outcomes Short Term: Participant states understanding of desired cholesterol values and is compliant with medications prescribed. Participant is following exercise prescription and nutrition guidelines.;Long Term: Cholesterol controlled with medications as prescribed, with individualized exercise RX and with personalized nutrition plan. Value goals: LDL < $Rem'70mg'OtcE$ , HDL > 40 mg.             Education:Diabetes - Individual verbal and written instruction to review signs/symptoms of diabetes, desired ranges of glucose level fasting, after meals and with exercise. Acknowledge that pre and post exercise glucose checks will be done for 3 sessions at entry of program. Flowsheet Row  Cardiac Rehab from 06/16/2022 in [facility] Cardiac and Pulmonary Rehab  Date 05/12/22       Core Components/Risk Factors/Patient Goals Review:   Goals and Risk Factor Review     Row Name 06/06/22 0949             Core Components/Risk Factors/Patient Goals Review   Personal Goals Review Weight Management/Obesity;Lipids;Diabetes       Review Dalena is watching her weight at home every day as she was just in the hospital a couple weeks ago for increased fluid and had to get a thoracentesis. She was feeling SOB but has resolved. The doctors gave her torsemide for medication management and was put on fluid restrictions. She is aware to notify her doctor of any significant weight change but was also told she can drop to double digits if it happens but trying to  maintain. She has lost weight , weighing around 108 lb. Her normal weight is about 115 lb. She is checking blood sugars at home, which are ranging around 115 fasting which she states is her normal. She is compliant checking them and takes all her medications as  directed. She is following up with PCP later today on blood work, including her cholesterol       Expected Outcomes Short: Continue to watch weight at home closely, notify doctor of any abnormal changes Long: Continue to manage lifestyle risk factors                Core Components/Risk Factors/Patient Goals at Discharge (Final Review):   Goals and Risk Factor Review - 06/06/22 0949       Core Components/Risk Factors/Patient Goals Review   Personal Goals Review Weight Management/Obesity;Lipids;Diabetes    Review Aditri is watching her weight at home every day as she was just in the hospital a couple weeks ago for increased fluid and had to get a thoracentesis. She was feeling SOB but has resolved. The doctors gave her torsemide for medication management and was put on fluid restrictions. She is aware to notify her doctor of any significant weight change but was also told she can drop  to double digits if it happens but trying to maintain. She has lost weight , weighing around 108 lb. Her normal weight is about 115 lb. She is checking blood sugars at home, which are ranging around 115 fasting which she states is her normal. She is compliant checking them and takes all her medications as  directed. She is following up with PCP later today on blood work, including her cholesterol    Expected Outcomes Short: Continue to watch weight at home closely, notify doctor of any abnormal changes Long: Continue to manage lifestyle risk factors             ITP Comments:  ITP Comments     Row Name 05/04/22 0958 05/12/22 1104 05/16/22 0925 05/25/22 0934 06/22/22 0747   ITP Comments Virtual orientation call completed today. shehas an appointment on Date: 05/12/2022  for EP eval and gym Orientation.  Documentation of diagnosis can be found in [facility] Date: 04/07/2022 . Completed 6MWT and gym orientation. Initial ITP created and sent for review to Dr. Ramonita Lab, covering for Dr. Sabra Heck Medical Director. First full day of exercise!  Patient was oriented to gym and equipment including functions, settings, policies, and procedures.  Patient's individual exercise prescription and treatment plan were reviewed.  All starting workloads were established based on the results of the 6 minute walk test done at initial orientation visit.  The plan for exercise progression was also introduced and progression will be customized based on patient's performance and goals. 30 Day review completed. Medical Director ITP review done, changes made as directed, and signed approval by Medical Director.    New to program 30 Day review completed. Medical Director ITP review done, changes made as directed, and signed approval by Medical Director.            Comments:

## 2022-06-23 ENCOUNTER — Encounter: Payer: Medicare PPO | Admitting: Internal Medicine

## 2022-06-27 ENCOUNTER — Encounter: Payer: Medicare PPO | Admitting: *Deleted

## 2022-06-27 DIAGNOSIS — Z952 Presence of prosthetic heart valve: Secondary | ICD-10-CM | POA: Diagnosis not present

## 2022-06-27 DIAGNOSIS — Z48812 Encounter for surgical aftercare following surgery on the circulatory system: Secondary | ICD-10-CM | POA: Diagnosis not present

## 2022-06-27 NOTE — Progress Notes (Signed)
Daily Session Note  Patient Details  Name: Patricia [name] MRN: [id number] Date of Birth: 06/26/1951 Referring Provider:   Flowsheet Row Cardiac Rehab from 05/12/2022 in [facility] Cardiac and Pulmonary Rehab  Referring Provider Eleonore Chiquito MD       Encounter Date: 06/27/2022  Check In:  Session Check In - 06/27/22 0939       Check-In   Supervising physician immediately available to respond to emergencies See telemetry face sheet for immediately available ER MD    Location [facility]-Cardiac & Pulmonary Rehab    Staff Present Heath Lark, RN, BSN, CCRP;Kristen Coble, RN,BC,MSN;Kelly [place], BS, ACSM CEP, Exercise Physiologist    Virtual Visit No    Medication changes reported     No    Fall or balance concerns reported    No    Warm-up and Cool-down Performed on first and last piece of equipment    Resistance Training Performed Yes    VAD Patient? No    PAD/SET Patient? No      Pain Assessment   Currently in Pain? No/denies                Social History   Tobacco Use  Smoking Status Never  Smokeless Tobacco Never    Goals Met:  Independence with exercise equipment Exercise tolerated well No report of concerns or symptoms today  Goals Unmet:  Not Applicable  Comments: Pt able to follow exercise prescription today without complaint.  Will continue to monitor for progression.    Dr. Emily Filbert is Medical Director for [place].  Dr. Ottie Glazier is Medical Director for [facility] Pulmonary Rehabilitation.

## 2022-06-28 ENCOUNTER — Ambulatory Visit (INDEPENDENT_AMBULATORY_CARE_PROVIDER_SITE_OTHER): Payer: Medicare PPO

## 2022-06-28 DIAGNOSIS — I442 Atrioventricular block, complete: Secondary | ICD-10-CM | POA: Diagnosis not present

## 2022-06-28 DIAGNOSIS — Z952 Presence of prosthetic heart valve: Secondary | ICD-10-CM | POA: Diagnosis not present

## 2022-06-28 DIAGNOSIS — Z48812 Encounter for surgical aftercare following surgery on the circulatory system: Secondary | ICD-10-CM | POA: Diagnosis not present

## 2022-06-28 LAB — CUP PACEART REMOTE DEVICE CHECK
Battery Remaining Longevity: 65 mo
Battery Voltage: 2.96 V
Brady Statistic AP VP Percent: 66.61 %
Brady Statistic AP VS Percent: 0 %
Brady Statistic AS VP Percent: 33.38 %
Brady Statistic AS VS Percent: 0 %
Brady Statistic RA Percent Paced: 66.59 %
Brady Statistic RV Percent Paced: 100 %
Date Time Interrogation Session: 20230717224507
Implantable Lead Implant Date: 20181009
Implantable Lead Implant Date: 20181009
Implantable Lead Location: 753859
Implantable Lead Location: 753860
Implantable Lead Model: 5076
Implantable Lead Model: 5076
Implantable Pulse Generator Implant Date: 20181009
Lead Channel Impedance Value: 228 Ohm
Lead Channel Impedance Value: 285 Ohm
Lead Channel Impedance Value: 304 Ohm
Lead Channel Impedance Value: 437 Ohm
Lead Channel Pacing Threshold Amplitude: 0.875 V
Lead Channel Pacing Threshold Amplitude: 1 V
Lead Channel Pacing Threshold Pulse Width: 0.4 ms
Lead Channel Pacing Threshold Pulse Width: 0.4 ms
Lead Channel Sensing Intrinsic Amplitude: 1.625 mV
Lead Channel Sensing Intrinsic Amplitude: 1.625 mV
Lead Channel Sensing Intrinsic Amplitude: 5.25 mV
Lead Channel Setting Pacing Amplitude: 2.25 V
Lead Channel Setting Pacing Amplitude: 2.5 V
Lead Channel Setting Pacing Pulse Width: 0.4 ms
Lead Channel Setting Sensing Sensitivity: 2 mV

## 2022-06-28 NOTE — Progress Notes (Signed)
Daily Session Note  Patient Details  Name: Patricia [name] MRN: [id number] Date of Birth: 1951/05/21 Referring Provider:   Flowsheet Row Cardiac Rehab from 05/12/2022 in [facility] Cardiac and Pulmonary Rehab  Referring Provider Eleonore Chiquito MD       Encounter Date: 06/28/2022  Check In:  Session Check In - 06/28/22 0917       Check-In   Supervising physician immediately available to respond to emergencies See telemetry face sheet for immediately available ER MD    Location [facility]-Cardiac & Pulmonary Rehab    Staff Present Earlean Shawl, BS, ACSM CEP, Exercise Physiologist;Bellarose Burtt, RN,BC,MSN;Jessica [place], MA, RCEP, CCRP, CCET    Virtual Visit No    Medication changes reported     No    Fall or balance concerns reported    No    Tobacco Cessation No Change    Warm-up and Cool-down Performed on first and last piece of equipment    Resistance Training Performed Yes    VAD Patient? No    PAD/SET Patient? No      Pain Assessment   Currently in Pain? No/denies    Multiple Pain Sites No                Social History   Tobacco Use  Smoking Status Never  Smokeless Tobacco Never    Goals Met:  Independence with exercise equipment Exercise tolerated well Personal goals reviewed No report of concerns or symptoms today  Goals Unmet:  Not Applicable  Comments: Pt able to follow exercise prescription today without complaint.  Will continue to monitor for progression.    Dr. Emily Filbert is Medical Director for [place].  Dr. Ottie Glazier is Medical Director for [facility] Pulmonary Rehabilitation.

## 2022-06-29 ENCOUNTER — Ambulatory Visit (INDEPENDENT_AMBULATORY_CARE_PROVIDER_SITE_OTHER): Payer: Medicare PPO | Admitting: *Deleted

## 2022-06-29 DIAGNOSIS — Z952 Presence of prosthetic heart valve: Secondary | ICD-10-CM

## 2022-06-29 DIAGNOSIS — Z7901 Long term (current) use of anticoagulants: Secondary | ICD-10-CM

## 2022-06-29 LAB — POCT INR: INR: 1.2 — AB (ref 2.0–3.0)

## 2022-06-29 NOTE — Patient Instructions (Signed)
Description   Today take 1 tablet and tomorrow take 1.5 tablets then start taking 1 tablet daily except 1/2 tablet only on Fridays.  Repeat INR 1 week. [phone]

## 2022-06-30 ENCOUNTER — Encounter: Payer: Self-pay | Admitting: Surgery

## 2022-06-30 ENCOUNTER — Ambulatory Visit (INDEPENDENT_AMBULATORY_CARE_PROVIDER_SITE_OTHER): Payer: Self-pay | Admitting: Surgery

## 2022-06-30 ENCOUNTER — Encounter: Payer: Medicare PPO | Admitting: *Deleted

## 2022-06-30 ENCOUNTER — Ambulatory Visit
Admission: RE | Admit: 2022-06-30 | Discharge: 2022-06-30 | Disposition: A | Discharge status: Home / Self Care | Payer: Medicare PPO | Source: Ambulatory Visit | Attending: Surgery | Admitting: Surgery

## 2022-06-30 VITALS — BP 103/56 | HR 89 | Resp 20 | Ht 65.0 in | Wt 110.0 lb

## 2022-06-30 DIAGNOSIS — Z952 Presence of prosthetic heart valve: Secondary | ICD-10-CM | POA: Diagnosis not present

## 2022-06-30 DIAGNOSIS — J90 Pleural effusion, not elsewhere classified: Secondary | ICD-10-CM

## 2022-06-30 DIAGNOSIS — Z48812 Encounter for surgical aftercare following surgery on the circulatory system: Secondary | ICD-10-CM | POA: Diagnosis not present

## 2022-06-30 DIAGNOSIS — I351 Nonrheumatic aortic (valve) insufficiency: Secondary | ICD-10-CM

## 2022-06-30 DIAGNOSIS — J811 Chronic pulmonary edema: Secondary | ICD-10-CM | POA: Diagnosis not present

## 2022-06-30 DIAGNOSIS — Z9889 Other specified postprocedural states: Secondary | ICD-10-CM

## 2022-06-30 NOTE — Progress Notes (Signed)
Daily Session Note  Patient Details  Name: Patricia [name] MRN: [id number] Date of Birth: Apr 24, 1951 Referring Provider:   Flowsheet Row Cardiac Rehab from 05/12/2022 in [facility] Cardiac and Pulmonary Rehab  Referring Provider Eleonore Chiquito MD       Encounter Date: 06/30/2022  Check In:  Session Check In - 06/30/22 0948       Check-In   Supervising physician immediately available to respond to emergencies See telemetry face sheet for immediately available ER MD    Location [facility]-Cardiac & Pulmonary Rehab    Staff Present Heath Lark, RN, BSN, CCRP;Kristen Coble, RN,BC,MSN;Melissa [place], RDN, LDN;Jessica [place], MA, RCEP, CCRP, CCET    Virtual Visit No    Medication changes reported     No    Fall or balance concerns reported    No    Warm-up and Cool-down Performed on first and last piece of equipment    Resistance Training Performed Yes    VAD Patient? No    PAD/SET Patient? No      Pain Assessment   Currently in Pain? No/denies                Social History   Tobacco Use  Smoking Status Never  Smokeless Tobacco Never    Goals Met:  Independence with exercise equipment Exercise tolerated well No report of concerns or symptoms today  Goals Unmet:  Not Applicable  Comments: Pt able to follow exercise prescription today without complaint.  Will continue to monitor for progression.    Dr. Emily Filbert is Medical Director for [place].  Dr. Ottie Glazier is Medical Director for [facility] Pulmonary Rehabilitation.

## 2022-06-30 NOTE — Progress Notes (Signed)
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  Her initial postoperative course was complicated by atrial flutter underneath her permanent pacemaker.  She was started on amiodarone and EP consult was requested and they adjusted her intrinsic permanent pacemaker to assist with rate control.  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  She return to the office in early June with a chest x-ray showing moderate to large bilateral pleural effusions with progressive shortness of breath.  She underwent bilateral thoracenteses removing 700 cc of fluid on the right on 05/23/2022 and 550 cc from the left on 05/24/2022.  Her chest x-ray showed complete drainage of the left pleural effusion but there was still some right pleural effusion at the conclusion of the right thoracentesis.  She was started on torsemide 40 mg daily.  I saw her in the office on 05/31/2022 and she was doing well clinically.  Chest x-ray showed a small to moderate-sized right pleural effusion and complete clearing of the left pleural effusion we decided to continue her on the current dose of torsemide 40 mg daily.  We continued her on amiodarone as well as Coumadin.  She saw Dr. Lovena Le in the office on 06/17/2022 and he decided to continue her on amiodarone for now.  She is here today with one of her friends.  She reports feeling well overall with no shortness of breath or cough.  She has been ambulating daily and going to cardiac rehab.  She just got back from vacation.  She does report continued hoarseness that worsens over the course of the day or if she is talking a lot.  She has also had some swallowing difficulty since surgery and  has to be very careful eating and drinking.  Current Outpatient Medications  Medication Sig Dispense Refill   acetaminophen (TYLENOL) 325 MG tablet Take 2 tablets (650 mg total) by mouth 2 (two) times daily as needed for moderate pain or headache.     alendronate (FOSAMAX) 70 MG tablet Take 1 tablet (70 mg total) by mouth once a week. (Patient taking differently: Take 70 mg by mouth once a week. Sunday) 12 tablet 3   amiodarone (PACERONE) 200 MG tablet Take 0.5 tablets (100 mg total) by mouth daily. 45 tablet 3   atorvastatin (LIPITOR) 20 MG tablet Take 1 tablet (20 mg total) by mouth daily. 30 tablet 3   Blood Glucose Monitoring Suppl (ACCU-CHEK GUIDE) w/Device KIT 1 Units by Does not apply route daily. 1 kit 0   Calcium Carbonate (CALCIUM 600 PO) Take 600 mg by mouth 2 (two) times daily. Lunch and Dinner     chlorthalidone (HYGROTON) 25 MG tablet TAKE 1 TABLET BY MOUTH ONCE A DAY 90 tablet 1   CREON 36000-114000 units CPEP capsule Take 36,000 Units by mouth 3 (three) times daily before meals.     esomeprazole (NEXIUM) 40 MG capsule Take 40 mg by mouth daily before breakfast.   12   fluticasone (FLONASE) 50 MCG/ACT nasal spray Place 2 sprays into both nostrils daily. (Patient taking differently: Place 2 sprays into both nostrils daily as needed for rhinitis.) 16 g 6   glucose blood (ACCU-CHEK GUIDE) test strip USE TO  TEST BLOOD SUGARS EVERY MORNING 100 each 3   Lancets (ACCU-CHEK SOFT TOUCH) lancets Check BS QAM 100 each 3   Lancets Misc. (ACCU-CHEK SOFTCLIX LANCET DEV) KIT Check BS QAM 1 kit 0   levothyroxine (SYNTHROID) 50 MCG tablet TAKE 1 TABLET BY MOUTH ONCE A DAY (Patient taking differently: Take 50 mcg by mouth daily before breakfast.) 90 tablet 3   metoprolol tartrate (LOPRESSOR) 50 MG tablet Take 1 tablet (50 mg total) by mouth 2 (two) times daily. 60 tablet 1   Multiple Vitamin (MULTIVITAMIN WITH MINERALS) TABS tablet Take 1 tablet by mouth daily with lunch. Centrum Silver.      potassium chloride (KLOR-CON) 20 MEQ packet Take 20 mEq by mouth daily. 30 packet 0   potassium chloride SA (KLOR-CON M) 20 MEQ tablet TAKE 1 TABLET BY MOUTH ONCE A DAY 30 tablet 10   torsemide 40 MG TABS Take 40 mg by mouth daily. 30 tablet 1   traMADol (ULTRAM) 50 MG tablet Take 1 tablet (50 mg total) by mouth every 6 (six) hours as needed for moderate pain. 28 tablet 0   warfarin (COUMADIN) 5 MG tablet Take 1 tablet (5 mg total) by mouth daily. (Patient taking differently: Take 2.5-5 mg by mouth See admin instructions. 5 mg on Tuesday,Thursday,Saturday,Sunday 2.5 mg on Monday,Wednesday,friday) 30 tablet 3   No current facility-administered medications for this visit.     Physical Exam: BP (!) 103/56   Pulse 89   Resp 20   Ht $R'5\' 5"'MN$  (1.651 m)   Wt 110 lb (49.9 kg)   SpO2 98% Comment: RA  BMI 18.30 kg/m  She looks well. Cardiac exam shows a regular rate and rhythm with normal heart sounds.  There is no murmur. Lungs are clear but breath sounds are decreased in the bases. The chest incision is well-healed and the sternum is stable. There is no peripheral edema.  Diagnostic Tests:  Narrative & Impression  CLINICAL DATA:  Status post mitral valve repair.   EXAM: CHEST - 2 VIEW   COMPARISON:  Chest radiographs 05/31/2022   FINDINGS: Sequelae of aortic valve replacement and mitral valve repair are again identified. The cardiomediastinal silhouette is unchanged with normal heart size. A pacemaker remains in place with leads terminating over the right atrium and right ventricle. Aortic atherosclerosis is noted.   A small right pleural effusion has mildly enlarged with fluid again seen in the major and minor fissures. A small left pleural effusion has also enlarged. Atelectasis is noted in the lung bases. There is mild central pulmonary vascular congestion without overt edema. No pneumothorax is identified. Numerous surgical clips are present in the upper abdomen. No acute  osseous abnormality is seen.   IMPRESSION: Interval enlargement of small bilateral pleural effusions.     Electronically Signed   By: Logan Bores M.D.   On: 06/30/2022 16:19      Impression:  Overall I think she is doing well clinically almost 3 months out from her surgery.  Her chest x-ray today does show slight enlargement of small bilateral pleural effusions compared to her x-ray 1 month ago.  She remains on torsemide 40 mg daily without any peripheral edema.  Her postoperative echo on 05/26/2022 showed a normally functioning 25 mm bioprosthetic aortic valve with a low mean gradient of 4.7 mmHg with a valve area by VTI of 2.2 cm.  This is a small 19 mm valve.  Her mean mitral gradient is 7 mmHg.  Left ventricular ejection fraction is  55 to 60%.  There is mild to moderate tricuspid regurgitation.  I will plan to keep her on her current dose of torsemide for now and do a follow-up chest x-ray in 1 month as long as she is doing well clinically.  These effusions could be inflammatory.  The etiology of her hoarseness and dysphagia are unclear.  I have to think that is probably related to intubation and TEE probe.  I would have expected these to resolve by now but they are getting better according to her.  I gave her the option of investigating these issues now with ENT consult and speech therapy evaluation but she would like to wait for another month and see how things progress.  I think that is reasonable.  I did ask her to be very careful eating and drinking to avoid aspiration.  She is still on Coumadin which was started postoperatively since she had an old bioprosthetic mitral valve and a new bioprosthetic aortic valve with atrial flutter.  She now appears to be maintaining sinus rhythm with ventricular pacing and from a valve standpoint can get off of Coumadin after 3 months.  She cannot take aspirin.  She was only on Coumadin for 3 months after her mitral valve replacement and then it was  discontinued and nothing was started.  I will discuss this further with Dr. Percival Spanish before discontinuing her Coumadin.  Plan:  I will see her back in the office in 1 month as long as she continues to do well.  If she develops any shortness of breath that might indicate enlarging effusions she will call my office so that we can get her in to do another chest x-ray.  She has a follow-up appointment in a few weeks with Dr. Percival Spanish and I will try to discuss her Coumadin with him before that.   [name], MD [facility] [phone]

## 2022-07-04 ENCOUNTER — Encounter: Payer: Medicare PPO | Admitting: *Deleted

## 2022-07-04 DIAGNOSIS — Z952 Presence of prosthetic heart valve: Secondary | ICD-10-CM

## 2022-07-04 DIAGNOSIS — Z48812 Encounter for surgical aftercare following surgery on the circulatory system: Secondary | ICD-10-CM | POA: Diagnosis not present

## 2022-07-04 NOTE — Progress Notes (Signed)
Daily Session Note  Patient Details  Name: Patricia [name] MRN: [id number] Date of Birth: [birth year] Referring Provider:   Flowsheet Row Cardiac Rehab from 05/12/2022 in [facility] Cardiac and Pulmonary Rehab  Referring Provider Eleonore Chiquito MD       Encounter Date: 07/04/2022  Check In:  Session Check In - 07/04/22 0951       Check-In   Supervising physician immediately available to respond to emergencies See telemetry face sheet for immediately available ER MD    Location [facility]-Cardiac & Pulmonary Rehab    Staff Present Heath Lark, RN, BSN, CCRP;Jessica Coleta, MA, RCEP, CCRP, [place], BS, ACSM CEP, Exercise Physiologist    Virtual Visit No    Medication changes reported     No    Fall or balance concerns reported    No    Warm-up and Cool-down Performed on first and last piece of equipment    Resistance Training Performed Yes    VAD Patient? No    PAD/SET Patient? No      Pain Assessment   Currently in Pain? No/denies                Social History   Tobacco Use  Smoking Status Never  Smokeless Tobacco Never    Goals Met:  Independence with exercise equipment Exercise tolerated well No report of concerns or symptoms today  Goals Unmet:  Not Applicable  Comments: Pt able to follow exercise prescription today without complaint.  Will continue to monitor for progression.    Dr. Emily Filbert is Medical Director for [place].  Dr. Ottie Glazier is Medical Director for [facility] Pulmonary Rehabilitation.

## 2022-07-07 ENCOUNTER — Other Ambulatory Visit: Payer: Self-pay | Admitting: Cardiology

## 2022-07-07 ENCOUNTER — Encounter: Payer: Medicare PPO | Admitting: *Deleted

## 2022-07-07 DIAGNOSIS — Z48812 Encounter for surgical aftercare following surgery on the circulatory system: Secondary | ICD-10-CM | POA: Diagnosis not present

## 2022-07-07 DIAGNOSIS — Z952 Presence of prosthetic heart valve: Secondary | ICD-10-CM | POA: Diagnosis not present

## 2022-07-07 NOTE — Progress Notes (Signed)
Daily Session Note  Patient Details  Name: Patricia [name] MRN: [id number] Date of Birth: 02/16/1951 Referring Provider:   Flowsheet Row Cardiac Rehab from 05/12/2022 in [facility] Cardiac and Pulmonary Rehab  Referring Provider Eleonore Chiquito MD       Encounter Date: 07/07/2022  Check In:  Session Check In - 07/07/22 0957       Check-In   Supervising physician immediately available to respond to emergencies See telemetry face sheet for immediately available ER MD    Location [facility]-Cardiac & Pulmonary Rehab    Staff Present Heath Lark, RN, BSN, CCRP;Jessica [place], MA, RCEP, CCRP, Marylynn Pearson, MS, ASCM CEP, Exercise Physiologist    Virtual Visit No    Medication changes reported     No    Fall or balance concerns reported    No    Warm-up and Cool-down Performed on first and last piece of equipment    Resistance Training Performed Yes    VAD Patient? No    PAD/SET Patient? No      Pain Assessment   Currently in Pain? No/denies                Social History   Tobacco Use  Smoking Status Never  Smokeless Tobacco Never    Goals Met:  Independence with exercise equipment Exercise tolerated well No report of concerns or symptoms today  Goals Unmet:  Not Applicable  Comments: Pt able to follow exercise prescription today without complaint.  Will continue to monitor for progression.    Dr. Emily Filbert is Medical Director for [place].  Dr. Ottie Glazier is Medical Director for [facility] Pulmonary Rehabilitation.

## 2022-07-08 ENCOUNTER — Ambulatory Visit (INDEPENDENT_AMBULATORY_CARE_PROVIDER_SITE_OTHER): Payer: Medicare PPO

## 2022-07-08 DIAGNOSIS — Z7901 Long term (current) use of anticoagulants: Secondary | ICD-10-CM

## 2022-07-08 DIAGNOSIS — Z952 Presence of prosthetic heart valve: Secondary | ICD-10-CM

## 2022-07-08 LAB — POCT INR: INR: 1.1 — AB (ref 2.0–3.0)

## 2022-07-08 NOTE — Patient Instructions (Signed)
Description   Today take 2 tablets and then START taking 1 tablet daily EXCEPT 1.5 tablets on Mondays and Wednesdays.  Repeat INR 1 week.  Coumadin Clinic# [phone]

## 2022-07-11 ENCOUNTER — Encounter: Payer: Medicare PPO | Admitting: *Deleted

## 2022-07-11 DIAGNOSIS — Z48812 Encounter for surgical aftercare following surgery on the circulatory system: Secondary | ICD-10-CM | POA: Diagnosis not present

## 2022-07-11 DIAGNOSIS — Z952 Presence of prosthetic heart valve: Secondary | ICD-10-CM

## 2022-07-11 NOTE — Progress Notes (Signed)
Daily Session Note  Patient Details  Name: Patricia [name] MRN: [id number] Date of Birth: 1951-11-24 Referring Provider:   Flowsheet Row Cardiac Rehab from 05/12/2022 in [facility] Cardiac and Pulmonary Rehab  Referring Provider Eleonore Chiquito MD       Encounter Date: 07/11/2022  Check In:  Session Check In - 07/11/22 1005       Check-In   Supervising physician immediately available to respond to emergencies See telemetry face sheet for immediately available ER MD    Location [facility]-Cardiac & Pulmonary Rehab    Staff Present Heath Lark, RN, BSN, [place], BS, ACSM CEP, Exercise Physiologist;Noah Tickle, BS, Exercise Physiologist    Virtual Visit No    Medication changes reported     No    Fall or balance concerns reported    No    Warm-up and Cool-down Performed on first and last piece of equipment    Resistance Training Performed Yes    VAD Patient? No    PAD/SET Patient? No      Pain Assessment   Currently in Pain? No/denies                Social History   Tobacco Use  Smoking Status Never  Smokeless Tobacco Never    Goals Met:  Independence with exercise equipment Exercise tolerated well No report of concerns or symptoms today  Goals Unmet:  Not Applicable  Comments: Pt able to follow exercise prescription today without complaint.  Will continue to monitor for progression.    Dr. Emily Filbert is Medical Director for [place].  Dr. Ottie Glazier is Medical Director for [facility] Pulmonary Rehabilitation.

## 2022-07-12 ENCOUNTER — Encounter: Payer: Medicare PPO | Attending: Cardiovascular Disease | Admitting: *Deleted

## 2022-07-12 DIAGNOSIS — Z952 Presence of prosthetic heart valve: Secondary | ICD-10-CM | POA: Insufficient documentation

## 2022-07-12 NOTE — Progress Notes (Signed)
Daily Session Note  Patient Details  Name: MARKEL MERGENTHALER MRN: [id number] Date of Birth: 1951-06-19 Referring Provider:   Flowsheet Row Cardiac Rehab from 05/12/2022 in [facility] Cardiac and Pulmonary Rehab  Referring Provider Eleonore Chiquito MD       Encounter Date: 07/12/2022  Check In:  Session Check In - 07/12/22 1038       Check-In   Supervising physician immediately available to respond to emergencies See telemetry face sheet for immediately available ER MD    Location [facility]-Cardiac & Pulmonary Rehab    Staff Present Heath Lark, RN, BSN, [place], BS, ACSM CEP, Exercise Physiologist;Noah Tickle, BS, Exercise Physiologist    Virtual Visit No    Medication changes reported     No    Fall or balance concerns reported    No    Warm-up and Cool-down Performed on first and last piece of equipment    Resistance Training Performed Yes    VAD Patient? No    PAD/SET Patient? No      Pain Assessment   Currently in Pain? No/denies                Social History   Tobacco Use  Smoking Status Never  Smokeless Tobacco Never    Goals Met:  Independence with exercise equipment Exercise tolerated well No report of concerns or symptoms today  Goals Unmet:  Not Applicable  Comments: Pt able to follow exercise prescription today without complaint.  Will continue to monitor for progression.    Dr. Emily Filbert is Medical Director for [place].  Dr. Ottie Glazier is Medical Director for [facility] Pulmonary Rehabilitation.

## 2022-07-14 ENCOUNTER — Other Ambulatory Visit: Payer: Self-pay

## 2022-07-14 ENCOUNTER — Ambulatory Visit (INDEPENDENT_AMBULATORY_CARE_PROVIDER_SITE_OTHER): Payer: Medicare PPO

## 2022-07-14 DIAGNOSIS — Z7901 Long term (current) use of anticoagulants: Secondary | ICD-10-CM

## 2022-07-14 DIAGNOSIS — Z952 Presence of prosthetic heart valve: Secondary | ICD-10-CM

## 2022-07-14 LAB — POCT INR: INR: 1.2 — AB (ref 2.0–3.0)

## 2022-07-14 MED ORDER — WARFARIN SODIUM 5 MG PO TABS: ORAL_TABLET | ORAL | 0 refills | Status: DC

## 2022-07-14 NOTE — Progress Notes (Signed)
Daily Session Note  Patient Details  Name: Patricia [name] MRN: [id number] Date of Birth: Jun 01, 1951 Referring Provider:   Flowsheet Row Cardiac Rehab from 05/12/2022 in [facility] Cardiac and Pulmonary Rehab  Referring Provider Eleonore Chiquito MD       Encounter Date: 07/14/2022  Check In:  Session Check In - 07/14/22 0923       Check-In   Supervising physician immediately available to respond to emergencies See telemetry face sheet for immediately available ER MD    Location [facility]-Cardiac & Pulmonary Rehab    Staff Present Antionette Fairy, BS, Exercise Physiologist;Susanne Bice, RN, BSN, CCRP;Melissa [place], RDN, Tawanna Solo, MS, ASCM CEP, Exercise Physiologist    Virtual Visit No    Medication changes reported     No    Fall or balance concerns reported    No    Warm-up and Cool-down Performed on first and last piece of equipment    Resistance Training Performed Yes    VAD Patient? No      Pain Assessment   Currently in Pain? No/denies    Multiple Pain Sites No                Social History   Tobacco Use  Smoking Status Never  Smokeless Tobacco Never    Goals Met:  Independence with exercise equipment Exercise tolerated well No report of concerns or symptoms today  Goals Unmet:  Not Applicable  Comments: Pt able to follow exercise prescription today without complaint.  Will continue to monitor for progression.    Dr. Emily Filbert is Medical Director for [place].  Dr. Ottie Glazier is Medical Director for [facility] Pulmonary Rehabilitation.

## 2022-07-14 NOTE — Telephone Encounter (Signed)
Prescription refill request received for warfarin Lov: 06/17/22 Lovena Le) Next INR check: 07/21/22 Warfarin tablet strength: '5mg'$   Appropriate dose and refill sent to requested pharmacy.

## 2022-07-14 NOTE — Patient Instructions (Addendum)
Description   Today take 3 tablets and 2 tablets tomorrow and then START taking 1 tablet daily EXCEPT 2 tablets on Mondays, Wednesdays, Saturdays.  Repeat INR 1 week. Coumadin Clinic# [phone]

## 2022-07-18 ENCOUNTER — Encounter: Payer: Medicare PPO | Admitting: *Deleted

## 2022-07-18 DIAGNOSIS — Z952 Presence of prosthetic heart valve: Secondary | ICD-10-CM

## 2022-07-18 NOTE — Progress Notes (Signed)
Daily Session Note  Patient Details  Name: Patricia [name] MRN: [id number] Date of Birth: [birth year] Referring Provider:   Flowsheet Row Cardiac Rehab from 05/12/2022 in [facility] Cardiac and Pulmonary Rehab  Referring Provider Patricia Chiquito MD       Encounter Date: 07/18/2022  Check In:  Session Check In - 07/18/22 0955       Check-In   Supervising physician immediately available to respond to emergencies See telemetry face sheet for immediately available ER MD    Location [facility]-Cardiac & Pulmonary Rehab    Staff Present Patricia Lark, RN, BSN, Patricia Bue, MS, ASCM CEP, Exercise Physiologist;Patricia [name], BS, ACSM CEP, Exercise Physiologist    Virtual Visit No    Medication changes reported     No    Fall or balance concerns reported    No    Warm-up and Cool-down Performed on first and last piece of equipment    Resistance Training Performed Yes    VAD Patient? No    PAD/SET Patient? No      Pain Assessment   Currently in Pain? No/denies                Social History   Tobacco Use  Smoking Status Never  Smokeless Tobacco Never    Goals Met:  Independence with exercise equipment Exercise tolerated well No report of concerns or symptoms today  Goals Unmet:  Not Applicable  Comments: Pt able to follow exercise prescription today without complaint.  Will continue to monitor for progression.    Dr. Emily [name] is Medical Director for [name].  Dr. Ottie [name] is Medical Director for [facility] Pulmonary Rehabilitation.

## 2022-07-19 ENCOUNTER — Encounter: Payer: Medicare PPO | Admitting: *Deleted

## 2022-07-19 DIAGNOSIS — Z952 Presence of prosthetic heart valve: Secondary | ICD-10-CM

## 2022-07-19 NOTE — Progress Notes (Unsigned)
Cardiology Office Note   Date:  07/19/2022   ID:  Patricia [name], DOB 07/02/1951, MRN [id number]  PCP:  Patricia Brooks, MD  Cardiologist:   Patricia Rotunda, MD   No chief complaint on file.    History of Present Illness: Patricia [name] is a 71 y.o. 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  She has had atrial flutter ablated.   She had moderate to severe AI.   TEE confirmed On New Year's Day and she awoke and noticed that she had lower extremity swelling.  She is actually since that time had started sleeping on 2 or 3 pillows.  She did not have any chest discomfort, neck or arm discomfort.  She has not had any cough fevers or chills.  She has not noticed any palpitations, presyncope or syncope.  She was treated with Lasix.  This did improve her lower extremity swelling.  She did have a mildly elevated BNP.  Her kidney function was normal.  Echo demonstrated aortic valve regurgitation is moderate to severe.  The EF was 55 to 60%.  There was a Medtronic valve in the mitral position and he appeared to have normal function.  There is mild to moderate mitral regurgitation.  There is a question of flow reversal in the descending aorta.  She had TEE which also suggested severe AI with flow reversal.    She is status post AVR.  She had normal aortic valve on echo.  ***  *** She returns to day to discuss this.  She is actually felt well since that time.  She is not having the dyspnea that she was having.  She is not having any PND or orthopnea.  She is not having any palpitations, presyncope or syncope.  She has had no weight gain or edema.   Past Medical History:  Diagnosis Date   Atrial flutter (HCC)    Ablated 1998 Dr. Graciela Husbands   Basal cell carcinoma 06/05/2019   R low back, ED[facility] 07/16/19   Basal cell carcinoma 04/15/2010   Right post shoulder   Basal cell carcinoma 04/15/2010   Mid back   Basal cell carcinoma (BCC)  09/29/2016   Left post shoulder. Superficial and nodular.   BCC (basal cell carcinoma) 07/21/2021   right mid back, [facility] 09/15/2021   BCC (basal cell carcinoma) 01/27/2022   left lower abdomen   Bleeding gastric ulcer    back in 2000   Breast cancer (HCC)    COPD (chronic obstructive pulmonary disease) (HCC)    from radiation   GERD (gastroesophageal reflux disease)    History of basal cell carcinoma (BCC) 03/22/2021   right neck and right medial ear, Moh's   Hodgkin's lymphoma (HCC)    Treated with radiation and chemo   Mitral regurgitation    pvc   Osteoporosis    lumbar verterbral fracture, left ankle fracture, dexa 2015   Pancreatitis    Pneumonia    Prediabetes    Presence of permanent cardiac pacemaker    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 09/13/2017   25 mm Medtronic Mosaic porcine stented bioprosthetic tissue valve   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   Left medial lower leg   Ulcer     Past Surgical History:  Procedure Laterality Date   AORTIC VALVE REPLACEMENT N/A 04/07/2022   Procedure: AORTIC VALVE REPLACEMENT (AVR), USING INSPIRIS 19 MM  AORTIC VALVE;  Surgeon: [name], MD;  Location: [place];  Service: Open Heart Surgery;  Laterality: N/A;   BREAST SURGERY     breast cancer since 2005   left mastectomy   BUBBLE STUDY  01/12/2022   Procedure: BUBBLE STUDY;  Surgeon: Geralynn Rile, MD;  Location: [place];  Service: Cardiovascular;;   CARDIAC ELECTROPHYSIOLOGY [place] REPLACEMENT N/A    [employer] 03/19/2021   CATARACT EXTRACTION, BILATERAL     COLONOSCOPY     IR RADIOLOGY PERIPHERAL GUIDED IV START  08/03/2017   IR THORACENTESIS ASP PLEURAL SPACE W/IMG GUIDE  05/23/2022   IR THORACENTESIS ASP PLEURAL SPACE W/IMG GUIDE  05/24/2022   IR US GUIDE VASC ACCESS RIGHT  08/03/2017   LAPAROTOMY     MASTECTOMY  2005   Left-axillary   MITRAL VALVE REPAIR Right 09/13/2017    Procedure: MINIMALLY INVASIVE MITRAL VALVE REPLACEMENT (MVR) with Medtronic Mosaic Porcine Heart Valve size 71mm;  Surgeon: Rexene Alberts, MD;  Location: [place];  Service: Open Heart Surgery;  Laterality: Right;   ORIF ANKLE FRACTURE Left 03/21/2014   Procedure: LEFT ANKLE FRACTURE OPEN TREATMENT BILMALLEOLAR INCLUDES INTERNAL FIXATION ;  Surgeon: Renette Butters, MD;  Location: [place];  Service: Orthopedics;  Laterality: Left;   PACEMAKER IMPLANT N/A 09/19/2017   Medtronic Azure XT MRI conditional dual-chamber pacemaker for symptomatic complete heart block  by Dr Rayann Heman   RIGHT/LEFT HEART CATH AND CORONARY ANGIOGRAPHY N/A 06/27/2017   Procedure: Right/Left Heart Cath and Coronary Angiography;  Surgeon: Larey [name], MD;  Location: [place] [facility];  Service: Cardiovascular;  Laterality: N/A;   SPLENECTOMY  1974   hodgkins disease   TEE WITHOUT CARDIOVERSION  06/12/2012   Procedure: TRANSESOPHAGEAL ECHOCARDIOGRAM (TEE);  Surgeon: Larey [name], MD;  Location: [place];  Service: Cardiovascular;  Laterality: N/A;   TEE WITHOUT CARDIOVERSION N/A 06/29/2017   Procedure: TRANSESOPHAGEAL ECHOCARDIOGRAM (TEE);  Surgeon: Jerline Pain, MD;  Location: [facility];  Service: Cardiovascular;  Laterality: N/A;   TEE WITHOUT CARDIOVERSION N/A 09/13/2017   Procedure: TRANSESOPHAGEAL ECHOCARDIOGRAM (TEE);  Surgeon: Rexene Alberts, MD;  Location: [place];  Service: Open Heart Surgery;  Laterality: N/A;   TEE WITHOUT CARDIOVERSION N/A 01/12/2022   Procedure: TRANSESOPHAGEAL ECHOCARDIOGRAM (TEE);  Surgeon: Geralynn Rile, MD;  Location: [place];  Service: Cardiovascular;  Laterality: N/A;   TEE WITHOUT CARDIOVERSION N/A 04/07/2022   Procedure: TRANSESOPHAGEAL ECHOCARDIOGRAM (TEE);  Surgeon: [name], MD;  Location: [place];  Service: Open Heart Surgery;  Laterality: N/A;   TONSILLECTOMY AND ADENOIDECTOMY       Current Outpatient Medications  Medication Sig Dispense  Refill   acetaminophen (TYLENOL) 325 MG tablet Take 2 tablets (650 mg total) by mouth 2 (two) times daily as needed for moderate pain or headache.     alendronate (FOSAMAX) 70 MG tablet Take 1 tablet (70 mg total) by mouth once a week. (Patient taking differently: Take 70 mg by mouth once a week. Sunday) 12 tablet 3   amiodarone (PACERONE) 200 MG tablet Take 0.5 tablets (100 mg total) by mouth daily. 45 tablet 3   atorvastatin (LIPITOR) 20 MG tablet Take 1 tablet (20 mg total) by mouth daily. 30 tablet 3   Blood Glucose Monitoring Suppl (ACCU-CHEK GUIDE) w/Device KIT 1 Units by Does not apply route daily. 1 kit 0   Calcium Carbonate (CALCIUM 600 PO) Take 600 mg by mouth 2 (  two) times daily. Lunch and Dinner     chlorthalidone (HYGROTON) 25 MG tablet TAKE 1 TABLET BY MOUTH ONCE A DAY 90 tablet 1   CREON 36000-114000 units CPEP capsule Take 36,000 Units by mouth 3 (three) times daily before meals.     esomeprazole (NEXIUM) 40 MG capsule Take 40 mg by mouth daily before breakfast.   12   fluticasone (FLONASE) 50 MCG/ACT nasal spray Place 2 sprays into both nostrils daily. (Patient taking differently: Place 2 sprays into both nostrils daily as needed for rhinitis.) 16 g 6   glucose blood (ACCU-CHEK GUIDE) test strip USE TO TEST BLOOD SUGARS EVERY MORNING 100 each 3   Lancets (ACCU-CHEK SOFT TOUCH) lancets Check BS QAM 100 each 3   Lancets Misc. (ACCU-CHEK SOFTCLIX LANCET DEV) KIT Check BS QAM 1 kit 0   levothyroxine (SYNTHROID) 50 MCG tablet TAKE 1 TABLET BY MOUTH ONCE A DAY (Patient taking differently: Take 50 mcg by mouth daily before breakfast.) 90 tablet 3   metoprolol tartrate (LOPRESSOR) 50 MG tablet Take 1 tablet (50 mg total) by mouth 2 (two) times daily. 60 tablet 1   Multiple Vitamin (MULTIVITAMIN WITH MINERALS) TABS tablet Take 1 tablet by mouth daily with lunch. Centrum Silver.     potassium chloride (KLOR-CON) 20 MEQ packet Take 20 mEq by mouth daily. 30 packet 0   potassium chloride SA  (KLOR-CON M) 20 MEQ tablet TAKE 1 TABLET BY MOUTH ONCE A DAY 30 tablet 10   torsemide 40 MG TABS Take 40 mg by mouth daily. 30 tablet 1   traMADol (ULTRAM) 50 MG tablet Take 1 tablet (50 mg total) by mouth every 6 (six) hours as needed for moderate pain. 28 tablet 0   warfarin (COUMADIN) 5 MG tablet TAKE 1 TO 2 TABLETS BY MOUTH DAILY AS DIRECTED BY COUMADIN CLINIC 120 tablet 0   No current facility-administered medications for this visit.    Allergies:   Aspirin, Azithromycin, Penicillins, Sulfa antibiotics, and Cheese    ROS:  Please see the history of present illness.   Otherwise, review of systems are positive for ***.   All other systems are reviewed and negative.    PHYSICAL EXAM: VS:  There were no vitals taken for this visit. , BMI There is no height or weight on file to calculate BMI. GENERAL:  Well appearing NECK:  No jugular venous distention, waveform within normal limits, carotid upstroke brisk and symmetric, no bruits, no thyromegaly LUNGS:  Clear to auscultation bilaterally CHEST:  Well healed sternotomy scar. HEART:  PMI not displaced or sustained,S1 and S2 within normal limits, no S3, no S4, no clicks, no rubs, *** murmurs ABD:  Flat, positive bowel sounds normal in frequency in pitch, no bruits, no rebound, no guarding, no midline pulsatile mass, no hepatomegaly, no splenomegaly EXT:  2 plus pulses throughout, no edema, no cyanosis no clubbing    ***GENERAL:  Well appearing NECK:  No jugular venous distention, waveform within normal limits, carotid upstroke brisk and symmetric, no bruits, no thyromegaly LUNGS:  Clear to auscultation bilaterally CHEST:  Unremarkable HEART:  PMI not displaced or sustained,S1 and S2 within normal limits, no S3, no S4, no clicks, no rubs, third left intercostal space systolic murmur, no diastolic murmurs ABD:  Flat, positive bowel sounds normal in frequency in pitch, no bruits, no rebound, no guarding, no midline pulsatile mass, no  hepatomegaly, no splenomegaly EXT:  2 plus pulses throughout, no edema, no cyanosis no clubbing   EKG:  EKG is *** ordered today. ***   TEE 1. There is severe aortic regurgitation due to incomplete leaflet  coaptation. The leaflets appear thickened and retracted. There is  restricted movement in systole, but no significant stenosis. The  regurgitation appears to be central but also into the  commissure between the NCC/LCC. 2D PISA radius 0.7 cm, 2D VC 0.5 cm, 2D  ERO 0.26 cm2, R vol 37 cc, RF 61%. PHT ~242 msec. There is holodiastolic  flow reversal in the descending aorta consistent with severe aortic  regurgitation. The aortic valve is  tricuspid. Aortic valve regurgitation is severe. No aortic stenosis is  present.   2. Left ventricular ejection fraction, by estimation, is 55 to 60%. The  left ventricle has normal function.   3. Right ventricular systolic function is normal. The right ventricular  size is normal.   4. No left atrial/left atrial appendage thrombus was detected. The LAA  emptying velocity was 43 cm/s.   5. 11mm Medtronic Mosaic prosthetic valve in mitral position (09/13/2017).  MG 3 mmHG @ 84 bpm, E max 1.8 m/s, EOA 1.52 cm2. No evidence of  regurgitation or paravalvular leak. The mitral valve has been  repaired/replaced. No evidence of mitral valve  regurgitation. There is a 25 mm Medtronic Mosaic bioprosthetic valve  present in the mitral position. Procedure Date: 09/13/2017.   6. There is mild (Grade II) layered plaque involving the descending  aorta.   Recent Labs: 04/08/2022: ALT 25; Magnesium 3.2 05/26/2022: B Natriuretic Peptide 216.0 06/06/2022: BUN 29; Creat 1.48; Hemoglobin 12.2; Platelets 503; Potassium 4.8; Sodium 140    Lipid Panel    Component Value Date/Time   CHOL 136 03/25/2021 0811   TRIG 88 03/25/2021 0811   HDL 62 03/25/2021 0811   CHOLHDL 2.2 03/25/2021 0811   VLDL 31 (H) 07/07/2016 0847   LDLCALC 57 03/25/2021 0811      Wt  Readings from Last 3 Encounters:  06/30/22 110 lb (49.9 kg)  06/17/22 109 lb (49.4 kg)  06/06/22 108 lb (49 kg)      Other studies Reviewed: Additional studies/ records that were reviewed today include: T*** Review of the above records demonstrates:  Please see elsewhere in the note.     ASSESSMENT AND PLAN:  MVR:   Mitral valve replacement appears to be stable.  There is some mild valvular stenosis.  ***    No change in therapy.  She understands endocarditis prophylaxis.  AVR:   ***      This is severe.  We had a long discussion about this and she will need surgical repair.  I am going to set her up to see Dr. Cyndia Bent.  She will continue current therapy.  Thankfully she is not having any symptoms at this time.   FLUTTER:   ***  She has had no symptomatic recurrence of this.  She tolerates her anticoagulation.  No change in therapy.  HTN:   The blood pressure is ***   at target.  No change in therapy.  Signed, Minus Breeding, MD  07/19/2022 12:48 PM    [facility]

## 2022-07-19 NOTE — Progress Notes (Signed)
Daily Session Note  Patient Details  Name: Patricia [name] MRN: [id number] Date of Birth: Apr 28, 1951 Referring Provider:   Flowsheet Row Cardiac Rehab from 05/12/2022 in [facility] Cardiac and Pulmonary Rehab  Referring Provider Eleonore Chiquito MD       Encounter Date: 07/19/2022  Check In:  Session Check In - 07/19/22 0907       Check-In   Supervising physician immediately available to respond to emergencies See telemetry face sheet for immediately available ER MD    Location [facility]-Cardiac & Pulmonary Rehab    Staff Present Nyoka Cowden, RN, BSN, MA    Virtual Visit No    Medication changes reported     No    Fall or balance concerns reported    No    Tobacco Cessation No Change    Warm-up and Cool-down Performed on first and last piece of equipment    Resistance Training Performed Yes    VAD Patient? No    PAD/SET Patient? No      Pain Assessment   Currently in Pain? No/denies                Social History   Tobacco Use  Smoking Status Never  Smokeless Tobacco Never    Goals Met:  Independence with exercise equipment Exercise tolerated well No report of concerns or symptoms today  Goals Unmet:  Not Applicable  Comments: Pt able to follow exercise prescription today without complaint.  Will continue to monitor for progression.    Dr. Emily Filbert is Medical Director for [place].  Dr. Ottie Glazier is Medical Director for [facility] Pulmonary Rehabilitation.

## 2022-07-20 ENCOUNTER — Encounter: Payer: Self-pay | Admitting: *Deleted

## 2022-07-20 DIAGNOSIS — Z952 Presence of prosthetic heart valve: Secondary | ICD-10-CM

## 2022-07-20 NOTE — Progress Notes (Signed)
Cardiac Individual Treatment Plan  Patient Details  Name: JANEIL SCHEXNAYDER MRN: [id number] Date of Birth: 1951/07/03 Referring Provider:   Flowsheet Row Cardiac Rehab from 05/12/2022 in [facility] Cardiac and Pulmonary Rehab  Referring Provider Eleonore Chiquito MD       Initial Encounter Date:  Flowsheet Row Cardiac Rehab from 05/12/2022 in [facility] Cardiac and Pulmonary Rehab  Date 05/12/22       Visit Diagnosis: S/P aortic valve replacement  Patient's Home Medications on Admission:  Current Outpatient Medications:    acetaminophen (TYLENOL) 325 MG tablet, Take 2 tablets (650 mg total) by mouth 2 (two) times daily as needed for moderate pain or headache., Disp: , Rfl:    alendronate (FOSAMAX) 70 MG tablet, Take 1 tablet (70 mg total) by mouth once a week. (Patient taking differently: Take 70 mg by mouth once a week. Sunday), Disp: 12 tablet, Rfl: 3   amiodarone (PACERONE) 200 MG tablet, Take 0.5 tablets (100 mg total) by mouth daily., Disp: 45 tablet, Rfl: 3   atorvastatin (LIPITOR) 20 MG tablet, Take 1 tablet (20 mg total) by mouth daily., Disp: 30 tablet, Rfl: 3   Blood Glucose Monitoring Suppl (ACCU-CHEK GUIDE) w/Device KIT, 1 Units by Does not apply route daily., Disp: 1 kit, Rfl: 0   Calcium Carbonate (CALCIUM 600 PO), Take 600 mg by mouth 2 (two) times daily. Lunch and Dinner, Disp: , Rfl:    chlorthalidone (HYGROTON) 25 MG tablet, TAKE 1 TABLET BY MOUTH ONCE A DAY, Disp: 90 tablet, Rfl: 1   CREON 36000-114000 units CPEP capsule, Take 36,000 Units by mouth 3 (three) times daily before meals., Disp: , Rfl:    esomeprazole (NEXIUM) 40 MG capsule, Take 40 mg by mouth daily before breakfast. , Disp: , Rfl: 12   fluticasone (FLONASE) 50 MCG/ACT nasal spray, Place 2 sprays into both nostrils daily. (Patient taking differently: Place 2 sprays into both nostrils daily as needed for rhinitis.), Disp: 16 g, Rfl: 6   glucose blood (ACCU-CHEK GUIDE) test strip, USE TO TEST BLOOD SUGARS EVERY MORNING,  Disp: 100 each, Rfl: 3   Lancets (ACCU-CHEK SOFT TOUCH) lancets, Check BS QAM, Disp: 100 each, Rfl: 3   Lancets Misc. (ACCU-CHEK SOFTCLIX LANCET DEV) KIT, Check BS QAM, Disp: 1 kit, Rfl: 0   levothyroxine (SYNTHROID) 50 MCG tablet, TAKE 1 TABLET BY MOUTH ONCE A DAY (Patient taking differently: Take 50 mcg by mouth daily before breakfast.), Disp: 90 tablet, Rfl: 3   metoprolol tartrate (LOPRESSOR) 50 MG tablet, Take 1 tablet (50 mg total) by mouth 2 (two) times daily., Disp: 60 tablet, Rfl: 1   Multiple Vitamin (MULTIVITAMIN WITH MINERALS) TABS tablet, Take 1 tablet by mouth daily with lunch. Centrum Silver., Disp: , Rfl:    potassium chloride (KLOR-CON) 20 MEQ packet, Take 20 mEq by mouth daily., Disp: 30 packet, Rfl: 0   potassium chloride SA (KLOR-CON M) 20 MEQ tablet, TAKE 1 TABLET BY MOUTH ONCE A DAY, Disp: 30 tablet, Rfl: 10   torsemide 40 MG TABS, Take 40 mg by mouth daily., Disp: 30 tablet, Rfl: 1   traMADol (ULTRAM) 50 MG tablet, Take 1 tablet (50 mg total) by mouth every 6 (six) hours as needed for moderate pain., Disp: 28 tablet, Rfl: 0   warfarin (COUMADIN) 5 MG tablet, TAKE 1 TO 2 TABLETS BY MOUTH DAILY AS DIRECTED BY COUMADIN CLINIC, Disp: 120 tablet, Rfl: 0  Past Medical History: Past Medical History:  Diagnosis Date   Atrial flutter ([place])  Ablated 1998 Dr. Caryl Comes   Basal cell carcinoma 06/05/2019   R low back, EDC 07/16/19   Basal cell carcinoma 04/15/2010   Right post shoulder   Basal cell carcinoma 04/15/2010   Mid back   Basal cell carcinoma (BCC) 09/29/2016   Left post shoulder. Superficial and nodular.   BCC (basal cell carcinoma) 07/21/2021   right mid back, [facility] 09/15/2021   BCC (basal cell carcinoma) 01/27/2022   left lower abdomen   Bleeding gastric ulcer    back in 2000   Breast cancer ([place])    COPD (chronic obstructive pulmonary disease) ([place])    from radiation   GERD (gastroesophageal reflux disease)    History of basal cell carcinoma (BCC) 03/22/2021    right neck and right medial ear, Moh's   Hodgkin's lymphoma ([place])    Treated with radiation and chemo   Mitral regurgitation    pvc   Osteoporosis    lumbar verterbral fracture, left ankle fracture, dexa 2015   Pancreatitis    Pneumonia    Prediabetes    Presence of permanent cardiac pacemaker    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 09/13/2017   25 mm Medtronic Mosaic porcine stented bioprosthetic tissue valve   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   Left medial lower leg   Ulcer     Tobacco Use: Social History   Tobacco Use  Smoking Status Never  Smokeless Tobacco Never    Labs: Review Flowsheet  More data exists      Latest Ref Rng & Units 12/19/2019 03/25/2021 04/05/2022 04/07/2022 04/08/2022  Labs for ITP Cardiac and Pulmonary Rehab  Cholestrol <200 mg/dL - 136  - - -  LDL (calc) mg/dL (calc) - 57  - - -  HDL-C > OR = 50 mg/dL - 62  - - -  Trlycerides <150 mg/dL - 88  - - -  Hemoglobin A1c 4.8 - 5.6 % 6.1  6.3  6.8  - -  PH, Arterial 7.35 - 7.45 - - 7.45  7.482  7.457  7.396  7.416  7.401  7.472  7.540  7.406  7.446  7.368  7.520   PCO2 arterial 32 - 48 mmHg - - 44  34.6  36.5  44.5  46.1  51.1  43.7  37.8  35.8  45.5  43.6  29.2   Bicarbonate 20.0 - 28.0 mmol/L - - 30.6  25.8  25.9  27.3  29.6  31.7  32.0  32.4  22.5  31.3  24.8  23.7   TCO2 22 - 32 mmol/L - - - $R'27  27  29  30  31  'wd$ 33  33  31  33  32  34  24  28  33  32  26  25   Acid-base deficit 0.0 - 2.0 mmol/L - - - 2.0  -  O2 Saturation % - - 98.5  100  100  100  100  100  100  100  81  100  100  99      Exercise Target Goals: Exercise Program Goal: Individual exercise prescription set using results from initial 6 min walk test and THRR while considering  patient's activity barriers and safety.   Exercise Prescription Goal: Initial exercise prescription builds to 30-45 minutes a day of aerobic activity, 2-3 days per week.  Home exercise guidelines will be given to  patient during program as part of exercise prescription that the participant will acknowledge.  Education: Aerobic Exercise: - Group verbal and visual presentation on the components of exercise prescription. Introduces F.I.T.T principle from ACSM for exercise prescriptions.  Reviews F.I.T.T. principles of aerobic exercise including progression. Written material given at graduation. Flowsheet Row Cardiac Rehab from 07/14/2022 in [facility] Cardiac and Pulmonary Rehab  Date 06/16/22  Educator [facility]  Instruction Review Code 1- Verbalizes Understanding       Education: Resistance Exercise: - Group verbal and visual presentation on the components of exercise prescription. Introduces F.I.T.T principle from ACSM for exercise prescriptions  Reviews F.I.T.T. principles of resistance exercise including progression. Written material given at graduation. Flowsheet Row Cardiac Rehab from 07/14/2022 in [facility] Cardiac and Pulmonary Rehab  Date 06/30/22  Educator [facility]  Instruction Review Code 1- Verbalizes Understanding        Education: Exercise & Equipment Safety: - Individual verbal instruction and demonstration of equipment use and safety with use of the equipment. Flowsheet Row Cardiac Rehab from 07/14/2022 in [facility] Cardiac and Pulmonary Rehab  Education need identified 05/12/22       Education: Exercise Physiology & General Exercise Guidelines: - Group verbal and written instruction with models to review the exercise physiology of the cardiovascular system and associated critical values. Provides general exercise guidelines with specific guidelines to those with heart or lung disease.  Flowsheet Row Cardiac Rehab from 07/14/2022 in [facility] Cardiac and Pulmonary Rehab  Date 06/09/22  Educator [facility]  Instruction Review Code 1- [employer] Understanding       Education: Flexibility, Balance, Mind/Body Relaxation: - Group verbal and visual presentation with interactive activity on the components of exercise  prescription. Introduces F.I.T.T principle from ACSM for exercise prescriptions. Reviews F.I.T.T. principles of flexibility and balance exercise training including progression. Also discusses the mind body connection.  Reviews various relaxation techniques to help reduce and manage stress (i.e. Deep breathing, progressive muscle relaxation, and visualization). Balance handout provided to take home. Written material given at graduation. Flowsheet Row Cardiac Rehab from 07/14/2022 in [facility] Cardiac and Pulmonary Rehab  Date 06/30/22  Educator [facility]  Instruction Review Code 1- Verbalizes Understanding       Activity Barriers & Risk Stratification:  Activity Barriers & Cardiac Risk Stratification - 05/12/22 1108       Activity Barriers & Cardiac Risk Stratification   Activity Barriers Arthritis;Joint Problems;Shortness of Breath;Deconditioning;Muscular Weakness   broke left ankle (plate/screws), bilateral knee arthritis   Cardiac Risk Stratification Moderate             6 Minute Walk:  6 Minute Walk     Row Name 05/12/22 1106         6 Minute Walk   Phase Initial     Distance 878 feet     Walk Time 6 minutes     # of Rest Breaks 0     MPH 1.66     METS 2.63     RPE 15     Perceived Dyspnea  3     VO2 Peak 9.21     Symptoms Yes (comment)     Comments fatigue, SOB     Resting HR 92 bpm     Resting BP 134/62     Resting Oxygen Saturation  98 %     Exercise Oxygen Saturation  during 6 min walk 94 %     Max Ex. HR 110 bpm     Max Ex. BP 154/74     2 Minute Post BP 124/70  Oxygen Initial Assessment:   Oxygen Re-Evaluation:   Oxygen Discharge (Final Oxygen Re-Evaluation):   Initial Exercise Prescription:  Initial Exercise Prescription - 05/12/22 1100       Date of Initial Exercise RX and Referring Provider   Date 05/12/22    Referring Provider Eleonore Chiquito MD      Oxygen   Maintain Oxygen Saturation 88% or higher      Treadmill   MPH 1.5     Grade 0.5    Minutes 15    METs 2.26      Recumbant Bike   Level 1    RPM 50    Watts 10    Minutes 15    METs 2      NuStep   Level 1    SPM 70    Minutes 15    METs 1.5      Biostep-RELP   Level 1    SPM 50    Minutes 15    METs 1.8      Track   Laps 15    Minutes 15    METs 1.82      Prescription Details   Frequency (times per week) 3    Duration Progress to 30 minutes of continuous aerobic without signs/symptoms of physical distress      Intensity   THRR 40-80% of Max Heartrate 114-138    Ratings of Perceived Exertion 11-13    Perceived Dyspnea 0-4      Progression   Progression Continue to progress workloads to maintain intensity without signs/symptoms of physical distress.      Resistance Training   Training Prescription Yes    Weight 3 lb    Reps 10-15             Perform Capillary Blood Glucose checks as needed.  Exercise Prescription Changes:   Exercise Prescription Changes     Row Name 05/12/22 1100 05/24/22 0800 06/06/22 0900 06/20/22 1400 07/05/22 0900     Response to Exercise   Blood Pressure (Admit) 134/62 102/60 112/60 102/58 116/60   Blood Pressure (Exercise) 154/74 118/64 118/60 104/60 --   Blood Pressure (Exit) 124/70 112/66 96/60 124/70 100/80   Heart Rate (Admit) 92 bpm 86 bpm 85 bpm 85 bpm 86 bpm   Heart Rate (Exercise) 110 bpm 95 bpm 96 bpm 93 bpm 94 bpm   Heart Rate (Exit) 103 bpm 89 bpm 94 bpm 87 bpm 86 bpm   Oxygen Saturation (Admit) 98 % -- -- -- --   Oxygen Saturation (Exercise) 94 % -- -- -- --   Oxygen Saturation (Exit) 98 % -- -- -- --   Rating of Perceived Exertion (Exercise) 15 12 12 13 12    Perceived Dyspnea (Exercise) 3 -- -- -- --   Symptoms fatigue, SOB none none none none   Comments 6MWT third full day of exercise -- -- --   Duration Progress to 30 minutes of  aerobic without signs/symptoms of physical distress Progress to 30 minutes of  aerobic without signs/symptoms of physical distress Progress to 30  minutes of  aerobic without signs/symptoms of physical distress Continue with 30 min of aerobic exercise without signs/symptoms of physical distress. Continue with 30 min of aerobic exercise without signs/symptoms of physical distress.   Intensity THRR unchanged THRR unchanged THRR unchanged THRR unchanged THRR unchanged     Progression   Progression Continue to progress workloads to maintain intensity without signs/symptoms of physical distress. Continue to progress workloads to maintain  intensity without signs/symptoms of physical distress. Continue to progress workloads to maintain intensity without signs/symptoms of physical distress. Continue to progress workloads to maintain intensity without signs/symptoms of physical distress. Continue to progress workloads to maintain intensity without signs/symptoms of physical distress.   Average METs 2.63 2.34 2.27 2.56 2.52     Resistance Training   Training Prescription -- Yes Yes Yes Yes   Weight -- 1 lb 2 lb 2 lb 2 lb   Reps -- 10-15 10-15 10-15 10-15     Interval Training   Interval Training -- No No No No     Recumbant Bike   Level -- 1 1 -- 1   Watts -- 12 16 -- 13   Minutes -- 15 15 -- 15   METs -- 2.94 3.05 -- 2.8     NuStep   Level -- $Remove'3 3 3 3   'bFTHwJV$ Minutes -- $RemoveBe'15 15 15 15   'wvMzCcBlP$ METs -- 2.1 2.1 2.5 2.3     Biostep-RELP   Level -- -- 1 -- --   Minutes -- -- 15 -- --   METs -- -- 2 -- --     Track   Laps -- $Remov'24 28 30 30   'CWAIhs$ Minutes -- $RemoveBe'15 15 15 15   'XjernmHcV$ METs -- 2.31 2 2.63 2.63     Home Exercise Plan   Plans to continue exercise at -- -- -- Home (comment)  walking, recumbent bike Home (comment)  walking, recumbent bike   Frequency -- -- -- Add 3 additional days to program exercise sessions. Add 3 additional days to program exercise sessions.   Initial Home Exercises Provided -- -- -- 06/08/22 06/08/22     Oxygen   Maintain Oxygen Saturation -- 88% or higher 88% or higher 88% or higher 88% or higher    Row Name 07/18/22 1100              Response to Exercise   Blood Pressure (Admit) 102/62       Blood Pressure (Exit) 106/72       Heart Rate (Admit) 77 bpm       Heart Rate (Exercise) 93 bpm       Heart Rate (Exit) 84 bpm       Rating of Perceived Exertion (Exercise) 15       Symptoms none       Duration Continue with 30 min of aerobic exercise without signs/symptoms of physical distress.       Intensity THRR unchanged         Progression   Progression Continue to progress workloads to maintain intensity without signs/symptoms of physical distress.       Average METs 2.82         Resistance Training   Training Prescription Yes       Weight 2 lb       Reps 10-15         Interval Training   Interval Training No         Treadmill   MPH 3       Grade 4.5       Minutes 15       METs 5.16         Recumbant Bike   Level 5       Minutes 15       METs 2.81         NuStep   Level 3       Minutes 15  METs 2.63         Track   Laps 30       Minutes 15       METs 2.63                Exercise Comments:   Exercise Comments     Row Name 05/16/22 0926           Exercise Comments First full day of exercise!  Patient was oriented to gym and equipment including functions, settings, policies, and procedures.  Patient's individual exercise prescription and treatment plan were reviewed.  All starting workloads were established based on the results of the 6 minute walk test done at initial orientation visit.  The plan for exercise progression was also introduced and progression will be customized based on patient's performance and goals.                Exercise Goals and Review:   Exercise Goals     Row Name 05/12/22 1126             Exercise Goals   Increase Physical Activity Yes       Intervention Provide advice, education, support and counseling about physical activity/exercise needs.;Develop an individualized exercise prescription for aerobic and resistive training based on initial  evaluation findings, risk stratification, comorbidities and participant's personal goals.       Expected Outcomes Short Term: Attend rehab on a regular basis to increase amount of physical activity.;Long Term: Add in home exercise to make exercise part of routine and to increase amount of physical activity.;Long Term: Exercising regularly at least 3-5 days a week.       Increase Strength and Stamina Yes       Intervention Provide advice, education, support and counseling about physical activity/exercise needs.;Develop an individualized exercise prescription for aerobic and resistive training based on initial evaluation findings, risk stratification, comorbidities and participant's personal goals.       Expected Outcomes Short Term: Increase workloads from initial exercise prescription for resistance, speed, and METs.;Short Term: Perform resistance training exercises routinely during rehab and add in resistance training at home;Long Term: Improve cardiorespiratory fitness, muscular endurance and strength as measured by increased METs and functional capacity (6MWT)       Able to understand and use rate of perceived exertion (RPE) scale Yes       Intervention Provide education and explanation on how to use RPE scale       Expected Outcomes Short Term: Able to use RPE daily in rehab to express subjective intensity level;Long Term:  Able to use RPE to guide intensity level when exercising independently       Able to understand and use Dyspnea scale Yes       Intervention Provide education and explanation on how to use Dyspnea scale       Expected Outcomes Short Term: Able to use Dyspnea scale daily in rehab to express subjective sense of shortness of breath during exertion;Long Term: Able to use Dyspnea scale to guide intensity level when exercising independently       Knowledge and understanding of Target Heart Rate Range (THRR) Yes       Intervention Provide education and explanation of THRR including how  the numbers were predicted and where they are located for reference       Expected Outcomes Short Term: Able to state/look up THRR;Long Term: Able to use THRR to govern intensity when exercising independently;Short Term: Able to use daily as  guideline for intensity in rehab       Able to check pulse independently Yes       Intervention Provide education and demonstration on how to check pulse in carotid and radial arteries.;Review the importance of being able to check your own pulse for safety during independent exercise       Expected Outcomes Short Term: Able to explain why pulse checking is important during independent exercise;Long Term: Able to check pulse independently and accurately       Understanding of Exercise Prescription Yes       Intervention Provide education, explanation, and written materials on patient's individual exercise prescription       Expected Outcomes Short Term: Able to explain program exercise prescription;Long Term: Able to explain home exercise prescription to exercise independently                Exercise Goals Re-Evaluation :  Exercise Goals Re-Evaluation     Row Name 05/16/22 0926 05/24/22 0826 06/06/22 0940 06/06/22 0952 06/20/22 1446     Exercise Goal Re-Evaluation   Exercise Goals Review Able to understand and use rate of perceived exertion (RPE) scale;Able to understand and use Dyspnea scale;Knowledge and understanding of Target Heart Rate Range (THRR);Understanding of Exercise Prescription Increase Physical Activity;Understanding of Exercise Prescription;Increase Strength and Stamina Increase Physical Activity;Understanding of Exercise Prescription;Increase Strength and Stamina Increase Physical Activity;Understanding of Exercise Prescription;Increase Strength and Stamina Increase Physical Activity;Understanding of Exercise Prescription;Increase Strength and Stamina   Comments Reviewed RPE and dyspnea scales, THR and program prescription with pt today.  Pt  voiced understanding and was given a copy of goals to take home. Freddie is off to a good start in rehab.  She has completed her first three full days of exercise.  She is already up to 24 laps.  We will continue to monitor her progress. Reviewed home exercise with pt today.  Pt plans to walk, work on her elliptical bike, and use handweights at home for exercise. She is already exercising a couple days outside of rehab. She is going to focus on checking her HR and extending her exercise sessions to minimum of 30 minutes. Reviewed THR, pulse, RPE, sign and symptoms, pulse oximetery and when to call 911 or MD.  Also discussed weather considerations and indoor options.  Pt voiced understanding. Larayah is doing well in rehab. She increased to 28 laps on the track. She also is up to level three on the T4 machine. Coraleigh has also improved to use 2 lb hand weights as well. We will continue to monitor her progress in the program. Shellye continues to do well in rehab.  She is up to 30 laps on the track!  She is also continues with level 3 on the NuStep.  We will conitnue to monitor her progress.   Expected Outcomes Short: Use RPE daily to regulate intensity. Long: Follow program prescription in THR. Short: Continue to attend rehab regularly Long: Continue to follow program prescription Short: Start checking HR using pulse ox during exercise Long: Maintain consistent independent exercise at home Short: Continue increase laps on the track. Long: Continue to improve strength and stamina. Short: Try 3 lb weights  Long: conitnue to improve stamina    Row Name 06/28/22 0940 07/05/22 0936 07/18/22 1147         Exercise Goal Re-Evaluation   Exercise Goals Review Increase Physical Activity;Increase Strength and Stamina;Understanding of Exercise Prescription Increase Physical Activity;Increase Strength and Stamina;Understanding of Exercise Prescription Increase Physical Activity;Increase Strength  and Stamina;Understanding of  Exercise Prescription     Comments Zephyr feels like she is gaining strength and since her hopitialization and getting her fluid levels under control she feels much better. She reports that she is able to do more not just in rehab but also in her daily life without as much fatigue as she gains strength. Ajooni has been experiencing some SOB during her exercise at rehab due to fluid gain. She recently had a thoracentesis and was just told by her doctor that her fluid is slowly coming back on. She knows to let us know if she feels symptomatic while exercising. She is following up with them in 1 month. She did increase to 30 laps on the track. She would benefit from increasing to level 2 on the recumbent bike. Will continue to monitor. Amenda has been doing well in rehab. She recently increased her overall average MET level to 2.82 METs. She also has been consistently reaching 30 laps on the track. She improved her workload on the treadmill as well, to a speed of 3 mph and an incline of 4.5%. We will continue to monitor her progress in the program.     Expected Outcomes Short: continue to attend cardiac rehab consistently and progress with workloads as tolerated. Long: upon gradiation from program, continue with an independent exercise program. Short: Increase load on bike Long: Continue to increase overall MET level Short: push for more laps on track. Long: Continue to improve strength and stamina.              Discharge Exercise Prescription (Final Exercise Prescription Changes):  Exercise Prescription Changes - 07/18/22 1100       Response to Exercise   Blood Pressure (Admit) 102/62    Blood Pressure (Exit) 106/72    Heart Rate (Admit) 77 bpm    Heart Rate (Exercise) 93 bpm    Heart Rate (Exit) 84 bpm    Rating of Perceived Exertion (Exercise) 15    Symptoms none    Duration Continue with 30 min of aerobic exercise without signs/symptoms of physical distress.    Intensity THRR unchanged       Progression   Progression Continue to progress workloads to maintain intensity without signs/symptoms of physical distress.    Average METs 2.82      Resistance Training   Training Prescription Yes    Weight 2 lb    Reps 10-15      Interval Training   Interval Training No      Treadmill   MPH 3    Grade 4.5    Minutes 15    METs 5.16      Recumbant Bike   Level 5    Minutes 15    METs 2.81      NuStep   Level 3    Minutes 15    METs 2.63      Track   Laps 30    Minutes 15    METs 2.63             Nutrition:  Target Goals: Understanding of nutrition guidelines, daily intake of sodium '1500mg'$ , cholesterol '200mg'$ , calories 30% from fat and 7% or less from saturated fats, daily to have 5 or more servings of fruits and vegetables.  Education: All About Nutrition: -Group instruction provided by verbal, written material, interactive activities, discussions, models, and posters to present general guidelines for heart healthy nutrition including fat, fiber, MyPlate, the role of sodium in heart healthy  nutrition, utilization of the nutrition label, and utilization of this knowledge for meal planning. Follow up email sent as well. Written material given at graduation. Flowsheet Row Cardiac Rehab from 07/14/2022 in [facility] Cardiac and Pulmonary Rehab  Education need identified 05/12/22  Date 07/07/22  Educator [place]  Instruction Review Code 1- Verbalizes Understanding       Biometrics:  Pre Biometrics - 05/12/22 1127       Pre Biometrics   Height 5' 5.1" (1.654 m)    Weight 118 lb 3.2 oz (53.6 kg)    BMI (Calculated) 19.6    Single Leg Stand 3.3 seconds              Nutrition Therapy Plan and Nutrition Goals:  Nutrition Therapy & Goals - 06/02/22 1024       Nutrition Therapy   RD appointment deferred Yes   Pt would not like to meet with RD at this time. Will continue to follow up.     Personal Nutrition Goals   Nutrition Goal Pt would not like to meet with RD  at this time. Will continue to follow up.             Nutrition Assessments:  MEDIFICTS Score Key: ?70 Need to make dietary changes  40-70 Heart Healthy Diet ? 40 Therapeutic Level Cholesterol Diet  Flowsheet Row Cardiac Rehab from 05/12/2022 in [facility] Cardiac and Pulmonary Rehab  Picture Your Plate Total Score on Admission 76      Picture Your Plate Scores: <46 Unhealthy dietary pattern with much room for improvement. 41-50 Dietary pattern unlikely to meet recommendations for good health and room for improvement. 51-60 More healthful dietary pattern, with some room for improvement.  >60 Healthy dietary pattern, although there may be some specific behaviors that could be improved.    Nutrition Goals Re-Evaluation:  Nutrition Goals Re-Evaluation     Row Name 06/28/22 0948             Goals   Comment Patient still delines meeting with program dietician. She met with a dietician several years ago and feels like she is following that plan well and is making heart healthy choices with her eating habits.       Expected Outcome Short: continue to follow up with patient to make sure she has not further quetions or wants to meet with dietician. Long: Continue to eat a heart healthy diet.                Nutrition Goals Discharge (Final Nutrition Goals Re-Evaluation):  Nutrition Goals Re-Evaluation - 06/28/22 0948       Goals   Comment Patient still delines meeting with program dietician. She met with a dietician several years ago and feels like she is following that plan well and is making heart healthy choices with her eating habits.    Expected Outcome Short: continue to follow up with patient to make sure she has not further quetions or wants to meet with dietician. Long: Continue to eat a heart healthy diet.             Psychosocial: Target Goals: Acknowledge presence or absence of significant depression and/or stress, maximize coping skills, provide positive  support system. Participant is able to verbalize types and ability to use techniques and skills needed for reducing stress and depression.   Education: Stress, Anxiety, and Depression - Group verbal and visual presentation to define topics covered.  Reviews how body is impacted by stress, anxiety, and depression.  Also discusses healthy ways to reduce stress and to treat/manage anxiety and depression.  Written material given at graduation. Flowsheet Row Cardiac Rehab from 07/14/2022 in [facility] Cardiac and Pulmonary Rehab  Date 06/02/22  Educator [facility]  Instruction Review Code 1- [employer] Understanding       Education: Sleep Hygiene -Provides group verbal and written instruction about how sleep can affect your health.  Define sleep hygiene, discuss sleep cycles and impact of sleep habits. Review good sleep hygiene tips.    Initial Review & Psychosocial Screening:  Initial Psych Review & Screening - 05/04/22 0943       Initial Review   Current issues with None Identified      Family Dynamics   Good Support System? Yes   friends and neighbors   Comments lost husband 08/20/2017 from pancreatic cancer       Barriers   Psychosocial barriers to participate in program There are no identifiable barriers or psychosocial needs.      Screening Interventions   Interventions Encouraged to exercise;To provide support and resources with identified psychosocial needs;Provide feedback about the scores to participant    Expected Outcomes Short Term goal: Utilizing psychosocial counselor, staff and physician to assist with identification of specific Stressors or current issues interfering with healing process. Setting desired goal for each stressor or current issue identified.;Long Term Goal: Stressors or current issues are controlled or eliminated.;Short Term goal: Identification and review with participant of any Quality of Life or Depression concerns found by scoring the questionnaire.;Long Term goal: The  participant improves quality of Life and PHQ9 Scores as seen by post scores and/or verbalization of changes             Quality of Life Scores:   Scores of 19 and below usually indicate a poorer quality of life in these areas.  A difference of  2-3 points is a clinically meaningful difference.  A difference of 2-3 points in the total score of the Quality of Life Index has been associated with significant improvement in overall quality of life, self-image, physical symptoms, and general health in studies assessing change in quality of life.  PHQ-9: Review Flowsheet  More data exists      05/12/2022 03/22/2021 12/19/2019 02/09/2018 01/23/2018  Depression screen PHQ 2/9  Decreased Interest 0 0 0 0 1  Down, Depressed, Hopeless 0 0 0 0 1  PHQ - 2 Score 0 0 0 0 2  Altered sleeping 1 - - - 2  Tired, decreased energy 3 - - - 1  Change in appetite 3 - - - 0  Feeling bad or failure about yourself  0 - - - 0  Trouble concentrating 0 - - - 0  Moving slowly or fidgety/restless 1 - - - 0  Suicidal thoughts 0 - - - 0  PHQ-9 Score 8 - - - 5  Difficult doing work/chores Somewhat difficult - - - Not difficult at all   Interpretation of Total Score  Total Score Depression Severity:  1-4 = Minimal depression, 5-9 = Mild depression, 10-14 = Moderate depression, 15-19 = Moderately severe depression, 20-27 = Severe depression   Psychosocial Evaluation and Intervention:  Psychosocial Evaluation - 05/04/22 0954       Psychosocial Evaluation & Interventions   Interventions Encouraged to exercise with the program and follow exercise prescription    Comments Shetara has no barriers to attending the program. [facility] is ready to start the program and work on her strength and stamina and learn about  caring for herself. She has completed CR in the past. She is a widow and has friends and neighbors that check on her daily. 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    Expected  Outcomes STG Atiyah will attend all scheduled sessions, she will gain back her strength and stamina to get back to her live and hobby of traveling. LTG Kierston will continue to progress after discharge    Continue Psychosocial Services  Follow up required by staff             Psychosocial Re-Evaluation:  Psychosocial Re-Evaluation     [facility] Name 06/06/22 1287 06/28/22 0947           Psychosocial Re-Evaluation   Current issues with None Identified None Identified      Comments Lissandra is doing well mentally. She has great support from family and friends. Not  on any medications for depression or anxiety. She is recovering from fluid gain and a thoracentesis in hospital and feels so much better with resolved SOB. She is going to [facility] in a couple weeks and is excited to spend time there with friends. She plans to bring her weights and exercise there. Nasrin reports no new concerns with stress, sleep, or mental health. She continues to have a good support system.      Expected Outcomes Short: Have fun on vacation Long: Continue to maintain positive attitude Short: Continue to exercise at rehab for physical and mental health benefits. Long: Indpendently maintain good mental health habits.      Interventions Encouraged to attend Cardiac Rehabilitation for the exercise Encouraged to attend Cardiac Rehabilitation for the exercise      Continue Psychosocial Services  Follow up required by staff Follow up required by staff               Psychosocial Discharge (Final Psychosocial Re-Evaluation):  Psychosocial Re-Evaluation - 06/28/22 0947       Psychosocial Re-Evaluation   Current issues with None Identified    Comments Ishitha reports no new concerns with stress, sleep, or mental health. She continues to have a good support system.    Expected Outcomes Short: Continue to exercise at rehab for physical and mental health benefits. Long: Indpendently maintain good mental health habits.     Interventions Encouraged to attend Cardiac Rehabilitation for the exercise    Continue Psychosocial Services  Follow up required by staff             Vocational Rehabilitation: Provide vocational rehab assistance to qualifying candidates.   Vocational Rehab Evaluation & Intervention:   Education: Education Goals: Education classes will be provided on a variety of topics geared toward better understanding of heart health and risk factor modification. Participant will state understanding/return demonstration of topics presented as noted by education test scores.  Learning Barriers/Preferences:   General Cardiac Education Topics:  AED/CPR: - Group verbal and written instruction with the use of models to demonstrate the basic use of the AED with the basic ABC's of resuscitation.   Anatomy and Cardiac Procedures: - Group verbal and visual presentation and models provide information about basic cardiac anatomy and function. Reviews the testing methods done to diagnose heart disease and the outcomes of the test results. Describes the treatment choices: Medical Management, Angioplasty, or Coronary Bypass Surgery for treating various heart conditions including Myocardial Infarction, Angina, Valve Disease, and Cardiac Arrhythmias.  Written material given at graduation. Flowsheet Row Cardiac Rehab from  07/14/2022 in [facility] Cardiac and Pulmonary Rehab  Education need identified 05/12/22       Medication Safety: - Group verbal and visual instruction to review commonly prescribed medications for heart and lung disease. Reviews the medication, class of the drug, and side effects. Includes the steps to properly store meds and maintain the prescription regimen.  Written material given at graduation. Flowsheet Row Cardiac Rehab from 07/14/2022 in [facility] Cardiac and Pulmonary Rehab  Date 07/14/22  Educator SB  Instruction Review Code 1- Verbalizes Understanding       Intimacy: - Group verbal  instruction through game format to discuss how heart and lung disease can affect sexual intimacy. Written material given at graduation.. Flowsheet Row Cardiac Rehab from 07/14/2022 in [facility] Cardiac and Pulmonary Rehab  Date 06/16/22  Educator [facility]  Instruction Review Code 1- Verbalizes Understanding       Know Your Numbers and Heart Failure: - Group verbal and visual instruction to discuss disease risk factors for cardiac and pulmonary disease and treatment options.  Reviews associated critical values for Overweight/Obesity, Hypertension, Cholesterol, and Diabetes.  Discusses basics of heart failure: signs/symptoms and treatments.  Introduces Heart Failure Zone chart for action plan for heart failure.  Written material given at graduation. Flowsheet Row Cardiac Rehab from 07/14/2022 in [facility] Cardiac and Pulmonary Rehab  Date 05/19/22  Educator SB  Instruction Review Code 1- Verbalizes Understanding       Infection Prevention: - Provides verbal and written material to individual with discussion of infection control including proper hand washing and proper equipment cleaning during exercise session. Flowsheet Row Cardiac Rehab from 07/14/2022 in [facility] Cardiac and Pulmonary Rehab  Education need identified 05/12/22       Falls Prevention: - Provides verbal and written material to individual with discussion of falls prevention and safety. Flowsheet Row Cardiac Rehab from 07/14/2022 in [facility] Cardiac and Pulmonary Rehab  Date 05/04/22  Educator SB  Instruction Review Code 1- Verbalizes Understanding       Other: -Provides group and verbal instruction on various topics (see comments)   Knowledge Questionnaire Score:   Core Components/Risk Factors/Patient Goals at Admission:  Personal Goals and Risk Factors at Admission - 05/12/22 1131       Core Components/Risk Factors/Patient Goals on Admission    Weight Management Yes;Weight Gain;Weight Maintenance    Intervention Weight Management:  Develop a combined nutrition and exercise program designed to reach desired caloric intake, while maintaining appropriate intake of nutrient and fiber, sodium and fats, and appropriate energy expenditure required for the weight goal.;Weight Management: Provide education and appropriate resources to help participant work on and attain dietary goals.    Admit Weight 118 lb 3.2 oz (53.6 kg)    Goal Weight: Short Term 120 lb (54.4 kg)    Goal Weight: Long Term 120 lb (54.4 kg)    Expected Outcomes Short Term: Continue to assess and modify interventions until short term weight is achieved;Long Term: Adherence to nutrition and physical activity/exercise program aimed toward attainment of established weight goal;Weight Gain: Understanding of general recommendations for a high calorie, high protein meal plan that promotes weight gain by distributing calorie intake throughout the day with the consumption for 4-5 meals, snacks, and/or supplements;Weight Maintenance: Understanding of the daily nutrition guidelines, which includes 25-35% calories from fat, 7% or less cal from saturated fats, less than $RemoveB'200mg'JTXDcHQA$  cholesterol, less than 1.5gm of sodium, & 5 or more servings of fruits and vegetables daily    Diabetes Yes    Intervention  Provide education about signs/symptoms and action to take for hypo/hyperglycemia.;Provide education about proper nutrition, including hydration, and aerobic/resistive exercise prescription along with prescribed medications to achieve blood glucose in normal ranges: Fasting glucose 65-99 mg/dL    Expected Outcomes Short Term: Participant verbalizes understanding of the signs/symptoms and immediate care of hyper/hypoglycemia, proper foot care and importance of medication, aerobic/resistive exercise and nutrition plan for blood glucose control.;Long Term: Attainment of HbA1C < 7%.    Lipids Yes    Intervention Provide education and support for participant on nutrition & aerobic/resistive exercise  along with prescribed medications to achieve LDL '70mg'$ , HDL >$Remo'40mg'xPiul$ .    Expected Outcomes Short Term: Participant states understanding of desired cholesterol values and is compliant with medications prescribed. Participant is following exercise prescription and nutrition guidelines.;Long Term: Cholesterol controlled with medications as prescribed, with individualized exercise RX and with personalized nutrition plan. Value goals: LDL < $Rem'70mg'VKHv$ , HDL > 40 mg.             Education:Diabetes - Individual verbal and written instruction to review signs/symptoms of diabetes, desired ranges of glucose level fasting, after meals and with exercise. Acknowledge that pre and post exercise glucose checks will be done for 3 sessions at entry of program. Flowsheet Row Cardiac Rehab from 07/14/2022 in [facility] Cardiac and Pulmonary Rehab  Date 05/12/22       Core Components/Risk Factors/Patient Goals Review:   Goals and Risk Factor Review     Row Name 06/06/22 0949 06/28/22 0942           Core Components/Risk Factors/Patient Goals Review   Personal Goals Review Weight Management/Obesity;Lipids;Diabetes Weight Management/Obesity;Lipids;Diabetes      Review Keli is watching her weight at home every day as she was just in the hospital a couple weeks ago for increased fluid and had to get a thoracentesis. She was feeling SOB but has resolved. The doctors gave her torsemide for medication management and was put on fluid restrictions. She is aware to notify her doctor of any significant weight change but was also told she can drop to double digits if it happens but trying to maintain. She has lost weight , weighing around 108 lb. Her normal weight is about 115 lb. She is checking blood sugars at home, which are ranging around 115 fasting which she states is her normal. She is compliant checking them and takes all her medications as  directed. She is following up with PCP later today on blood work, including her  cholesterol Tedra has been doing well since her hopsital discharge and medicaiton change with managing fluid levels. She reports feeling much better and continues to monitor weight and take all medications as prescribed. She has an x-ray scheduled for this week as a follow up to make sure all her fluid has cleared.  She reports checking her blood sugars at home and that they are in acceptable ranges.      Expected Outcomes Short: Continue to watch weight at home closely, notify doctor of any abnormal changes Long: Continue to manage lifestyle risk factors Continue to monitor weight at home and have x-ray and follow up with doctor. Long: monitor and control risk factors with help of healthcare team and lifestyle changes.               Core Components/Risk Factors/Patient Goals at Discharge (Final Review):   Goals and Risk Factor Review - 06/28/22 0942       Core Components/Risk Factors/Patient Goals Review   Personal Goals Review Weight  Management/Obesity;Lipids;Diabetes    Review Maryuri has been doing well since her hopsital discharge and medicaiton change with managing fluid levels. She reports feeling much better and continues to monitor weight and take all medications as prescribed. She has an x-ray scheduled for this week as a follow up to make sure all her fluid has cleared.  She reports checking her blood sugars at home and that they are in acceptable ranges.    Expected Outcomes Continue to monitor weight at home and have x-ray and follow up with doctor. Long: monitor and control risk factors with help of healthcare team and lifestyle changes.             ITP Comments:  ITP Comments     Row Name 05/04/22 0958 05/12/22 1104 05/16/22 0925 05/25/22 0934 06/22/22 0747   ITP Comments Virtual orientation call completed today. shehas an appointment on Date: 05/12/2022  for EP eval and gym Orientation.  Documentation of diagnosis can be found in [facility] Date: 04/07/2022 . Completed 6MWT and gym  orientation. Initial ITP created and sent for review to Dr. Ramonita Lab, covering for Dr. Sabra Heck Medical Director. First full day of exercise!  Patient was oriented to gym and equipment including functions, settings, policies, and procedures.  Patient's individual exercise prescription and treatment plan were reviewed.  All starting workloads were established based on the results of the 6 minute walk test done at initial orientation visit.  The plan for exercise progression was also introduced and progression will be customized based on patient's performance and goals. 30 Day review completed. Medical Director ITP review done, changes made as directed, and signed approval by Medical Director.    New to program 30 Day review completed. Medical Director ITP review done, changes made as directed, and signed approval by Medical Director.    [place] Name 07/20/22 0647           ITP Comments 30 Day review completed. Medical Director ITP review done, changes made as directed, and signed approval by Medical Director.                Comments:

## 2022-07-21 ENCOUNTER — Ambulatory Visit (INDEPENDENT_AMBULATORY_CARE_PROVIDER_SITE_OTHER): Payer: Medicare PPO

## 2022-07-21 ENCOUNTER — Ambulatory Visit: Payer: Medicare PPO | Admitting: Cardiology

## 2022-07-21 ENCOUNTER — Encounter: Payer: Self-pay | Admitting: Cardiology

## 2022-07-21 VITALS — BP 136/78 | HR 89 | Ht 65.0 in | Wt 110.8 lb

## 2022-07-21 DIAGNOSIS — I4892 Unspecified atrial flutter: Secondary | ICD-10-CM | POA: Diagnosis not present

## 2022-07-21 DIAGNOSIS — I10 Essential (primary) hypertension: Secondary | ICD-10-CM

## 2022-07-21 DIAGNOSIS — Z7901 Long term (current) use of anticoagulants: Secondary | ICD-10-CM | POA: Diagnosis not present

## 2022-07-21 DIAGNOSIS — Z952 Presence of prosthetic heart valve: Secondary | ICD-10-CM | POA: Diagnosis not present

## 2022-07-21 DIAGNOSIS — Z5181 Encounter for therapeutic drug level monitoring: Secondary | ICD-10-CM

## 2022-07-21 DIAGNOSIS — Z9889 Other specified postprocedural states: Secondary | ICD-10-CM | POA: Diagnosis not present

## 2022-07-21 LAB — POCT INR: INR: 2.4 (ref 2.0–3.0)

## 2022-07-21 MED ORDER — METOPROLOL TARTRATE 100 MG PO TABS: 100.0000 mg | ORAL_TABLET | Freq: Two times a day (BID) | ORAL | 3 refills | Status: DC

## 2022-07-21 NOTE — Patient Instructions (Signed)
Medication Instructions:  STOP amiodarone INCREASE metoprolol tartrate (Lopressor) to 100 mg two times daily  *If you need a refill on your cardiac medications before your next appointment, please call your pharmacy*  Follow-Up: At [facility], you and your health needs are our priority.  As part of our continuing mission to provide you with exceptional heart care, we have created designated Provider Care Teams.  These Care Teams include your primary Cardiologist (physician) and Advanced Practice Providers (APPs -  Physician Assistants and Nurse Practitioners) who all work together to provide you with the care you need, when you need it.  We recommend signing up for the patient portal called "MyChart".  Sign up information is provided on this After Visit Summary.  MyChart is used to connect with patients for Virtual Visits (Telemedicine).  Patients are able to view lab/test results, encounter notes, upcoming appointments, etc.  Non-urgent messages can be sent to your provider as well.   To learn more about what you can do with MyChart, go to [email or web address].    Your next appointment:   3 month(s)  The format for your next appointment:   In Person  Provider:   Minus Breeding, MD {   Important Information About Sugar

## 2022-07-21 NOTE — Patient Instructions (Signed)
CONTINUE  taking 1 tablet daily EXCEPT 2 tablets on Mondays, Wednesdays, Saturdays.  Repeat INR 3 weeks. Coumadin Clinic# [phone]

## 2022-07-25 ENCOUNTER — Encounter: Payer: Medicare PPO | Admitting: *Deleted

## 2022-07-25 DIAGNOSIS — Z952 Presence of prosthetic heart valve: Secondary | ICD-10-CM

## 2022-07-25 NOTE — Progress Notes (Signed)
Daily Session Note  Patient Details  Name: LECIA ESPERANZA MRN: [id number] Date of Birth: 1951/09/02 Referring Provider:   Flowsheet Row Cardiac Rehab from 05/12/2022 in [facility] Cardiac and Pulmonary Rehab  Referring Provider Eleonore Chiquito MD       Encounter Date: 07/25/2022  Check In:  Session Check In - 07/25/22 0929       Check-In   Supervising physician immediately available to respond to emergencies See telemetry face sheet for immediately available ER MD    Location [facility]-Cardiac & Pulmonary Rehab    Staff Present Heath Lark, RN, BSN, Laveda Norman, BS, ACSM CEP, Exercise Physiologist;Laureen Owens Shark, BS, RRT, CPFT    Virtual Visit No    Medication changes reported     No    Fall or balance concerns reported    No    Warm-up and Cool-down Performed on first and last piece of equipment    Resistance Training Performed Yes    VAD Patient? No    PAD/SET Patient? No      Pain Assessment   Currently in Pain? No/denies                Social History   Tobacco Use  Smoking Status Never  Smokeless Tobacco Never    Goals Met:  Independence with exercise equipment Exercise tolerated well No report of concerns or symptoms today  Goals Unmet:  Not Applicable  Comments: Pt able to follow exercise prescription today without complaint.  Will continue to monitor for progression.    Dr. Emily Filbert is Medical Director for [facility].  Dr. Ottie Glazier is Medical Director for [facility] Pulmonary Rehabilitation.

## 2022-07-26 ENCOUNTER — Encounter: Payer: Medicare PPO | Admitting: *Deleted

## 2022-07-26 VITALS — Ht 65.1 in | Wt 113.7 lb

## 2022-07-26 DIAGNOSIS — Z952 Presence of prosthetic heart valve: Secondary | ICD-10-CM

## 2022-07-26 NOTE — Progress Notes (Signed)
Remote pacemaker transmission.   

## 2022-07-26 NOTE — Progress Notes (Signed)
Daily Session Note  Patient Details  Name: Patricia [name] MRN: [id number] Date of Birth: 1951/04/12 Referring Provider:   Flowsheet Row Cardiac Rehab from 05/12/2022 in [facility] Cardiac and Pulmonary Rehab  Referring Provider Eleonore Chiquito MD       Encounter Date: 07/26/2022  Check In:  Session Check In - 07/26/22 0935       Check-In   Supervising physician immediately available to respond to emergencies See telemetry face sheet for immediately available ER MD    Location [facility]-Cardiac & Pulmonary Rehab    Staff Present Heath Lark, RN, BSN, CCRP;Jessica [place], MA, RCEP, CCRP, [place], BS, ACSM CEP, Exercise Physiologist    Virtual Visit No    Medication changes reported     No    Fall or balance concerns reported    No    Warm-up and Cool-down Performed on first and last piece of equipment    Resistance Training Performed Yes    VAD Patient? No    PAD/SET Patient? No      Pain Assessment   Currently in Pain? No/denies              6 Minute Walk     Row Name 05/12/22 1106 07/26/22 0931       6 Minute Walk   Phase Initial Discharge    Distance 878 feet 980 feet    Distance % Change -- 12 %    Distance Feet Change -- 105 ft    Walk Time 6 minutes 6 minutes    # of Rest Breaks 0 0    MPH 1.66 1.56    METS 2.63 2.53    RPE 15 12    Perceived Dyspnea  3 1    VO2 Peak 9.21 8.86    Symptoms Yes (comment) Yes (comment)    Comments fatigue, SOB mild lightheadedness    Resting HR 92 bpm 62 bpm    Resting BP 134/62 122/84    Resting Oxygen Saturation  98 % 94 %    Exercise Oxygen Saturation  during 6 min walk 94 % 94 %    Max Ex. HR 110 bpm 89 bpm    Max Ex. BP 154/74 118/60    2 Minute Post BP 124/70 104/68                Social History   Tobacco Use  Smoking Status Never  Smokeless Tobacco Never    Goals Met:  Independence with exercise equipment Exercise tolerated well Personal goals reviewed No report of concerns or symptoms  today  Goals Unmet:  Not Applicable  Comments: Pt able to follow exercise prescription today without complaint.  Will continue to monitor for progression.    Dr. Emily Filbert is Medical Director for [place].  Dr. Ottie Glazier is Medical Director for [facility] Pulmonary Rehabilitation.

## 2022-07-26 NOTE — Patient Instructions (Addendum)
Discharge Patient Instructions  Patient Details  Name: Patricia [name] MRN: [id number] Date of Birth: 02/08/1951 Referring Provider:  Susy Frizzle, MD   Number of Visits: 31  Reason for Discharge:  Patient reached a stable level of exercise. Patient independent in their exercise. Patient has met program and personal goals.  Smoking History:  Social History   Tobacco Use  Smoking Status Never  Smokeless Tobacco Never    Diagnosis:  S/P aortic valve replacement  Initial Exercise Prescription:  Initial Exercise Prescription - 05/12/22 1100       Date of Initial Exercise RX and Referring Provider   Date 05/12/22    Referring Provider Eleonore Chiquito MD      Oxygen   Maintain Oxygen Saturation 88% or higher      Treadmill   MPH 1.5    Grade 0.5    Minutes 15    METs 2.26      Recumbant Bike   Level 1    RPM 50    Watts 10    Minutes 15    METs 2      NuStep   Level 1    SPM 70    Minutes 15    METs 1.5      Biostep-RELP   Level 1    SPM 50    Minutes 15    METs 1.8      Track   Laps 15    Minutes 15    METs 1.82      Prescription Details   Frequency (times per week) 3    Duration Progress to 30 minutes of continuous aerobic without signs/symptoms of physical distress      Intensity   THRR 40-80% of Max Heartrate 114-138    Ratings of Perceived Exertion 11-13    Perceived Dyspnea 0-4      Progression   Progression Continue to progress workloads to maintain intensity without signs/symptoms of physical distress.      Resistance Training   Training Prescription Yes    Weight 3 lb    Reps 10-15             Discharge Exercise Prescription (Final Exercise Prescription Changes):  Exercise Prescription Changes - 07/18/22 1100       Response to Exercise   Blood Pressure (Admit) 102/62    Blood Pressure (Exit) 106/72    Heart Rate (Admit) 77 bpm    Heart Rate (Exercise) 93 bpm    Heart Rate (Exit) 84 bpm    Rating of Perceived  Exertion (Exercise) 15    Symptoms none    Duration Continue with 30 min of aerobic exercise without signs/symptoms of physical distress.    Intensity THRR unchanged      Progression   Progression Continue to progress workloads to maintain intensity without signs/symptoms of physical distress.    Average METs 2.82      Resistance Training   Training Prescription Yes    Weight 2 lb    Reps 10-15      Interval Training   Interval Training No      Treadmill   MPH 3    Grade 4.5    Minutes 15    METs 5.16      Recumbant Bike   Level 5    Minutes 15    METs 2.81      NuStep   Level 3    Minutes 15    METs 2.63  Track   Laps 30    Minutes 15    METs 2.63             Functional Capacity:  6 Minute Walk     Row Name 05/12/22 1106 07/26/22 0931       6 Minute Walk   Phase Initial Discharge    Distance 878 feet 980 feet    Distance % Change -- 12 %    Distance Feet Change -- 105 ft    Walk Time 6 minutes 6 minutes    # of Rest Breaks 0 0    MPH 1.66 1.56    METS 2.63 2.53    RPE 15 12    Perceived Dyspnea  3 1    VO2 Peak 9.21 8.86    Symptoms Yes (comment) Yes (comment)    Comments fatigue, SOB mild lightheadedness    Resting HR 92 bpm 62 bpm    Resting BP 134/62 122/84    Resting Oxygen Saturation  98 % 94 %    Exercise Oxygen Saturation  during 6 min walk 94 % 94 %    Max Ex. HR 110 bpm 89 bpm    Max Ex. BP 154/74 118/60    2 Minute Post BP 124/70 104/68            Nutrition & Weight - Outcomes:  Pre Biometrics - 05/12/22 1127       Pre Biometrics   Height 5' 5.1" (1.654 m)    Weight 118 lb 3.2 oz (53.6 kg)    BMI (Calculated) 19.6    Single Leg Stand 3.3 seconds             Post Biometrics - 07/26/22 0936        Post  Biometrics   Height 5' 5.1" (1.654 m)    Weight 113 lb 11.2 oz (51.6 kg)    BMI (Calculated) 18.85    Single Leg Stand 4.78 seconds             Goals reviewed with patient; copy given to patient.

## 2022-07-28 ENCOUNTER — Ambulatory Visit: Payer: Medicare PPO | Admitting: Dermatology

## 2022-07-28 DIAGNOSIS — L821 Other seborrheic keratosis: Secondary | ICD-10-CM | POA: Diagnosis not present

## 2022-07-28 DIAGNOSIS — C44319 Basal cell carcinoma of skin of other parts of face: Secondary | ICD-10-CM | POA: Diagnosis not present

## 2022-07-28 DIAGNOSIS — Z1283 Encounter for screening for malignant neoplasm of skin: Secondary | ICD-10-CM | POA: Diagnosis not present

## 2022-07-28 DIAGNOSIS — C44519 Basal cell carcinoma of skin of other part of trunk: Secondary | ICD-10-CM | POA: Diagnosis not present

## 2022-07-28 DIAGNOSIS — D485 Neoplasm of uncertain behavior of skin: Secondary | ICD-10-CM

## 2022-07-28 DIAGNOSIS — L814 Other melanin hyperpigmentation: Secondary | ICD-10-CM

## 2022-07-28 DIAGNOSIS — L578 Other skin changes due to chronic exposure to nonionizing radiation: Secondary | ICD-10-CM

## 2022-07-28 DIAGNOSIS — D0471 Carcinoma in situ of skin of right lower limb, including hip: Secondary | ICD-10-CM | POA: Diagnosis not present

## 2022-07-28 DIAGNOSIS — D1800 Hemangioma unspecified site: Secondary | ICD-10-CM

## 2022-07-28 DIAGNOSIS — L570 Actinic keratosis: Secondary | ICD-10-CM

## 2022-07-28 DIAGNOSIS — Z85828 Personal history of other malignant neoplasm of skin: Secondary | ICD-10-CM

## 2022-07-28 DIAGNOSIS — D229 Melanocytic nevi, unspecified: Secondary | ICD-10-CM | POA: Diagnosis not present

## 2022-07-28 DIAGNOSIS — I8393 Asymptomatic varicose veins of bilateral lower extremities: Secondary | ICD-10-CM

## 2022-07-28 DIAGNOSIS — D099 Carcinoma in situ, unspecified: Secondary | ICD-10-CM

## 2022-07-28 HISTORY — DX: Carcinoma in situ, unspecified: D09.9

## 2022-07-28 MED ORDER — MUPIROCIN 2 % EX OINT: TOPICAL_OINTMENT | CUTANEOUS | 0 refills | Status: DC

## 2022-07-28 NOTE — Progress Notes (Signed)
Follow-Up Visit   Subjective  Patricia [name] is a 71 y.o. female who presents for the following: Annual Exam (Hx SCC, BCC) and Bx proven BCC (L lower abdomen - patient is here today for [facility]). The patient presents for Total-Body Skin Exam (TBSE) for skin cancer screening and mole check.  The patient has spots, moles and lesions to be evaluated, some may be new or changing.  The following portions of the chart were reviewed this encounter and updated as appropriate:   Tobacco  Allergies  Meds  Problems  Med Hx  Surg Hx  Fam Hx      Review of Systems:  No other skin or systemic complaints except as noted in HPI or Assessment and Plan.  Objective  Well appearing patient in no apparent distress; mood and affect are within normal limits.  A full examination was performed including scalp, head, eyes, ears, nose, lips, neck, chest, axillae, abdomen, back, buttocks, bilateral upper extremities, bilateral lower extremities, hands, feet, fingers, toes, fingernails, and toenails. All findings within normal limits unless otherwise noted below.  L lower abdomen Pink papule  Left Lower Leg - Anterior Erythematous thin papules/macules with gritty scale.   R ant thigh 0.8 cm scaly brown and pink papule.     R lat forehead 1.1 cm pink plaque.     L buttock 1.0 cm pink and brown plaque.       Assessment & Plan  Basal cell carcinoma (BCC) of skin of other part of torso L lower abdomen  Destruction of lesion  Destruction method: electrodesiccation and curettage   Informed consent: discussed and consent obtained   Timeout:  patient name, date of birth, surgical site, and procedure verified Anesthesia: the lesion was anesthetized in a standard fashion   Anesthetic:  1% lidocaine w/ epinephrine 1-100,000 buffered w/ 8.4% NaHCO3 Curettage performed in three different directions: Yes   Electrodesiccation performed over the curetted area: Yes   Curettage cycles:  3 Final  wound size (cm):  1.1 Hemostasis achieved with:  electrodesiccation Outcome: patient tolerated procedure well with no complications   Post-procedure details: sterile dressing applied and wound care instructions given   Dressing type: petrolatum    mupirocin ointment (BACTROBAN) 2 % Apply to healing wounds QD-BID until healed.  Start Mupirocin 2% ointment to aa's QD-BID until healed.   AK (actinic keratosis) Left Lower Leg - Anterior  Actinic keratoses are precancerous spots that appear secondary to cumulative UV radiation exposure/sun exposure over time. They are chronic with expected duration over 1 year. A portion of actinic keratoses will progress to squamous cell carcinoma of the skin. It is not possible to reliably predict which spots will progress to skin cancer and so treatment is recommended to prevent development of skin cancer.  Recommend daily broad spectrum sunscreen SPF 30+ to sun-exposed areas, reapply every 2 hours as needed.  Recommend staying in the shade or wearing long sleeves, sun glasses (UVA+UVB protection) and wide brim hats (4-inch brim around the entire circumference of the hat). Call for new or changing lesions.  Plan treatment with LN2 at follow-up  Neoplasm of uncertain behavior of skin (3) R ant thigh  Epidermal / dermal shaving  Lesion diameter (cm):  0.8 Informed consent: discussed and consent obtained   Timeout: patient name, date of birth, surgical site, and procedure verified   Procedure prep:  Patient was prepped and draped in usual sterile fashion Prep type:  Isopropyl alcohol Anesthesia: the lesion was anesthetized in a  standard fashion   Anesthetic:  1% lidocaine w/ epinephrine 1-100,000 buffered w/ 8.4% NaHCO3 Instrument used: flexible razor blade   Hemostasis achieved with: pressure, aluminum chloride and electrodesiccation   Outcome: patient tolerated procedure well   Post-procedure details: sterile dressing applied and wound care  instructions given   Dressing type: bandage and petrolatum    Specimen 1 - Surgical pathology Differential Diagnosis: D48.5 r/o SCCIS Check Margins: No  R lat forehead  Skin / nail biopsy Type of biopsy: tangential   Informed consent: discussed and consent obtained   Timeout: patient name, date of birth, surgical site, and procedure verified   Procedure prep:  Patient was prepped and draped in usual sterile fashion Prep type:  Isopropyl alcohol Anesthesia: the lesion was anesthetized in a standard fashion   Anesthetic:  1% lidocaine w/ epinephrine 1-100,000 buffered w/ 8.4% NaHCO3 Instrument used: flexible razor blade   Hemostasis achieved with: pressure, aluminum chloride and electrodesiccation   Outcome: patient tolerated procedure well   Post-procedure details: sterile dressing applied and wound care instructions given   Dressing type: bandage and petrolatum    Specimen 2 - Surgical pathology Differential Diagnosis: D48.5 r/o BCC  Check Margins: No  L buttock  Epidermal / dermal shaving  Lesion diameter (cm):  1.1 Informed consent: discussed and consent obtained   Timeout: patient name, date of birth, surgical site, and procedure verified   Procedure prep:  Patient was prepped and draped in usual sterile fashion Prep type:  Isopropyl alcohol Anesthesia: the lesion was anesthetized in a standard fashion   Anesthetic:  1% lidocaine w/ epinephrine 1-100,000 buffered w/ 8.4% NaHCO3 Instrument used: flexible razor blade   Hemostasis achieved with: pressure, aluminum chloride and electrodesiccation   Outcome: patient tolerated procedure well   Post-procedure details: sterile dressing applied and wound care instructions given   Dressing type: bandage and petrolatum    Specimen 3 - Surgical pathology Differential Diagnosis: D48.5 r/o MM vs BCC   Check Margins: No  Discussed Pros and cons of Mohs vs ED&C. Patient prefers ED&C here in the office due to other medical  conditions    Lentigines - Scattered tan macules - Due to sun exposure - Benign-appearing, observe - Recommend daily broad spectrum sunscreen SPF 30+ to sun-exposed areas, reapply every 2 hours as needed. - Call for any changes  Seborrheic Keratoses - Stuck-on, waxy, tan-brown papules and/or plaques  - Benign-appearing - Discussed benign etiology and prognosis. - Observe - Call for any changes  Melanocytic Nevi - Tan-brown and/or pink-flesh-colored symmetric macules and papules - Benign appearing on exam today - Observation - Call clinic for new or changing moles - Recommend daily use of broad spectrum spf 30+ sunscreen to sun-exposed areas.   Hemangiomas - Red papules - Discussed benign nature - Observe - Call for any changes  Actinic Damage - Chronic condition, secondary to cumulative UV/sun exposure - diffuse scaly erythematous macules with underlying dyspigmentation - Recommend daily broad spectrum sunscreen SPF 30+ to sun-exposed areas, reapply every 2 hours as needed.  - Staying in the shade or wearing long sleeves, sun glasses (UVA+UVB protection) and wide brim hats (4-inch brim around the entire circumference of the hat) are also recommended for sun protection.  - Call for new or changing lesions.  History of Squamous Cell Carcinoma of the Skin - No evidence of recurrence today - No lymphadenopathy - Recommend regular full body skin exams - Recommend daily broad spectrum sunscreen SPF 30+ to sun-exposed areas, reapply every 2  hours as needed.  - Call if any new or changing lesions are noted between office visits  History of Basal Cell Carcinoma of the Skin - No evidence of recurrence today - Recommend regular full body skin exams - Recommend daily broad spectrum sunscreen SPF 30+ to sun-exposed areas, reapply every 2 hours as needed.  - Call if any new or changing lesions are noted between office visits  Varicose Veins/Spider Veins - Dilated blue, purple or  red veins at the lower extremities - Reassured - Smaller vessels can be treated by sclerotherapy (a procedure to inject a medicine into the veins to make them disappear) if desired, but the treatment is not covered by insurance. Larger vessels may be covered if symptomatic and we would refer to vascular surgeon if treatment desired.  Skin cancer screening performed today.  Return in about 6 months (around 01/28/2023) for TBSE;  4-6 weeks for [facility].  Luther Redo, CMA, am acting as scribe for Forest Gleason, MD .  Documentation: I have reviewed the above documentation for accuracy and completeness, and I agree with the above.  Forest Gleason, MD

## 2022-07-28 NOTE — Patient Instructions (Signed)
Due to recent changes in healthcare laws, you may see results of your pathology and/or laboratory studies on MyChart before the doctors have had a chance to review them. We understand that in some cases there may be results that are confusing or concerning to you. Please understand that not all results are received at the same time and often the doctors may need to interpret multiple results in order to provide you with the best plan of care or course of treatment. Therefore, we ask that you please give us 2 business days to thoroughly review all your results before contacting the office for clarification. Should we see a critical lab result, you will be contacted sooner.   If You Need Anything After Your Visit  If you have any questions or concerns for your doctor, please call our main line at [phone] and press option 4 to reach your doctor's medical assistant. If no one answers, please leave a voicemail as directed and we will return your call as soon as possible. Messages left after 4 pm will be answered the following business day.   You may also send us a message via MyChart. We typically respond to MyChart messages within 1-2 business days.  For prescription refills, please ask your pharmacy to contact our office. Our fax number is [phone].  If you have an urgent issue when the clinic is closed that cannot wait until the next business day, you can page your doctor at the number below.    Please note that while we do our best to be available for urgent issues outside of office hours, we are not available 24/7.   If you have an urgent issue and are unable to reach us, you may choose to seek medical care at your doctor's office, retail clinic, urgent care center, or emergency room.  If you have a medical emergency, please immediately call 911 or go to the emergency department.  Pager Numbers  - Dr. Kowalski: [phone]  - Dr. Moye: [phone]  - Dr. Stewart:  [phone]  In the event of inclement weather, please call our main line at [phone] for an update on the status of any delays or closures.  Dermatology Medication Tips: Please keep the boxes that topical medications come in in order to help keep track of the instructions about where and how to use these. Pharmacies typically print the medication instructions only on the boxes and not directly on the medication tubes.   If your medication is too expensive, please contact our office at [phone] option 4 or send us a message through MyChart.   We are unable to tell what your co-pay for medications will be in advance as this is different depending on your insurance coverage. However, we may be able to find a substitute medication at lower cost or fill out paperwork to get insurance to cover a needed medication.   If a prior authorization is required to get your medication covered by your insurance company, please allow us 1-2 business days to complete this process.  Drug prices often vary depending on where the prescription is filled and some pharmacies may offer cheaper prices.  The website www.goodrx.com contains coupons for medications through different pharmacies. The prices here do not account for what the cost may be with help from insurance (it may be cheaper with your insurance), but the website can give you the price if you did not use any insurance.  - You can print the associated coupon and take it with   your prescription to the pharmacy.  - You may also stop by our office during regular business hours and pick up a GoodRx coupon card.  - If you need your prescription sent electronically to a different pharmacy, notify our office through [place] MyChart or by phone at [phone] option 4.     Si Usted Necesita Algo Despus de Su Visita  Tambin puede enviarnos un mensaje a travs de MyChart. Por lo general respondemos a los mensajes de MyChart en el transcurso de 1 a 2  das hbiles.  Para renovar recetas, por favor pida a su farmacia que se ponga en contacto con nuestra oficina. Nuestro nmero de fax es el [phone].  Si tiene un asunto urgente cuando la clnica est cerrada y que no puede esperar hasta el siguiente da hbil, puede llamar/localizar a su doctor(a) al nmero que aparece a continuacin.   Por favor, tenga en cuenta que aunque hacemos todo lo posible para estar disponibles para asuntos urgentes fuera del horario de oficina, no estamos disponibles las 24 horas del da, los 7 das de la semana.   Si tiene un problema urgente y no puede comunicarse con nosotros, puede optar por buscar atencin mdica  en el consultorio de su doctor(a), en una clnica privada, en un centro de atencin urgente o en una sala de emergencias.  Si tiene una emergencia mdica, por favor llame inmediatamente al 911 o vaya a la sala de emergencias.  Nmeros de bper  - Dr. Kowalski: [phone]  - Dra. Moye: [phone]  - Dra. Stewart: [phone]  En caso de inclemencias del tiempo, por favor llame a nuestra lnea principal al [phone] para una actualizacin sobre el estado de cualquier retraso o cierre.  Consejos para la medicacin en dermatologa: Por favor, guarde las cajas en las que vienen los medicamentos de uso tpico para ayudarle a seguir las instrucciones sobre dnde y cmo usarlos. Las farmacias generalmente imprimen las instrucciones del medicamento slo en las cajas y no directamente en los tubos del medicamento.   Si su medicamento es muy caro, por favor, pngase en contacto con nuestra oficina llamando al [phone] y presione la opcin 4 o envenos un mensaje a travs de MyChart.   No podemos decirle cul ser su copago por los medicamentos por adelantado ya que esto es diferente dependiendo de la cobertura de su seguro. Sin embargo, es posible que podamos encontrar un medicamento sustituto a menor costo o llenar un formulario para que el  seguro cubra el medicamento que se considera necesario.   Si se requiere una autorizacin previa para que su compaa de seguros cubra su medicamento, por favor permtanos de 1 a 2 das hbiles para completar este proceso.  Los precios de los medicamentos varan con frecuencia dependiendo del lugar de dnde se surte la receta y alguna farmacias pueden ofrecer precios ms baratos.  El sitio web www.goodrx.com tiene cupones para medicamentos de diferentes farmacias. Los precios aqu no tienen en cuenta lo que podra costar con la ayuda del seguro (puede ser ms barato con su seguro), pero el sitio web puede darle el precio si no utiliz ningn seguro.  - Puede imprimir el cupn correspondiente y llevarlo con su receta a la farmacia.  - Tambin puede pasar por nuestra oficina durante el horario de atencin regular y recoger una tarjeta de cupones de GoodRx.  - Si necesita que su receta se enve electrnicamente a una farmacia diferente, informe a nuestra oficina a travs de MyChart de    o por telfono llamando al [phone] y presione la opcin 4.  

## 2022-07-29 ENCOUNTER — Other Ambulatory Visit: Payer: Self-pay | Admitting: Family Medicine

## 2022-08-01 ENCOUNTER — Encounter: Payer: Medicare PPO | Admitting: *Deleted

## 2022-08-01 DIAGNOSIS — Z952 Presence of prosthetic heart valve: Secondary | ICD-10-CM | POA: Diagnosis not present

## 2022-08-01 NOTE — Progress Notes (Signed)
Daily Session Note  Patient Details  Name: Patricia [name] MRN: [id number] Date of Birth: 07-Sep-1951 Referring Provider:   Flowsheet Row Cardiac Rehab from 05/12/2022 in [facility] Cardiac and Pulmonary Rehab  Referring Provider Eleonore Chiquito MD       Encounter Date: 08/01/2022  Check In:  Session Check In - 08/01/22 1003       Check-In   Supervising physician immediately available to respond to emergencies See telemetry face sheet for immediately available ER MD    Location [facility]-Cardiac & Pulmonary Rehab    Staff Present Heath Lark, RN, BSN, CCRP;Joseph Huron, RCP,RRT,BSRT;Kelly Bronson, BS, ACSM CEP, Exercise Physiologist    Virtual Visit No    Medication changes reported     No    Fall or balance concerns reported    No    Warm-up and Cool-down Performed on first and last piece of equipment    Resistance Training Performed Yes    VAD Patient? No    PAD/SET Patient? No      Pain Assessment   Currently in Pain? No/denies                Social History   Tobacco Use  Smoking Status Never  Smokeless Tobacco Never    Goals Met:  Independence with exercise equipment Exercise tolerated well No report of concerns or symptoms today  Goals Unmet:  Not Applicable  Comments: Pt able to follow exercise prescription today without complaint.  Will continue to monitor for progression.    Dr. Emily Filbert is Medical Director for [place].  Dr. Ottie Glazier is Medical Director for [facility] Pulmonary Rehabilitation.

## 2022-08-02 ENCOUNTER — Other Ambulatory Visit: Payer: Self-pay | Admitting: Surgery

## 2022-08-02 ENCOUNTER — Telehealth: Payer: Self-pay

## 2022-08-02 DIAGNOSIS — J90 Pleural effusion, not elsewhere classified: Secondary | ICD-10-CM

## 2022-08-02 NOTE — Telephone Encounter (Signed)
Patient scheduled for EDC x 3 and LN2 on 08/10/22. Lurlean Horns., RMA

## 2022-08-02 NOTE — Telephone Encounter (Signed)
-----   Message from Alfonso Patten, MD sent at 08/02/2022  1:24 PM EDT ----- 1. Skin , right ant thigh SQUAMOUS CELL CARCINOMA IN SITU, ASSOCIATED WITH VERRUCA --> ED&C  2. Skin , right lat forehead SUPERFICIAL AND NODULAR BASAL CELL CARCINOMA --> Mohs vs ED&C. Pt prefers ED&C  EDC would leave a round depressed whitish scar about the same size as the original lesion.  It is treated here in office in a procedure we call "scrape and burn".  No further pathology would be performed.  Around an 85% cure rate. Mohs would leave a linear scar and pathology would be done at time of procedure to ensure complete removal.  It has a 98-99% cure rate. We would refer patient to a specialist for Mohs surgery.   3. Skin , left buttock SUPERFICIAL BASAL CELL CARCINOMA, PIGMENTED --> ED&C  WILL ALSO PLAN TO FREEZE AK AT LEG from last visit.  Discussed results with pt.  MAs please call to schedule for 20 minute ED&C and LN2 for AK appointment (and cancel Sept 19 appt). Thank you!

## 2022-08-03 ENCOUNTER — Encounter: Payer: Self-pay | Admitting: Surgery

## 2022-08-03 ENCOUNTER — Ambulatory Visit
Admission: RE | Admit: 2022-08-03 | Discharge: 2022-08-03 | Disposition: A | Discharge status: Home / Self Care | Payer: Medicare PPO | Source: Ambulatory Visit | Attending: Surgery | Admitting: Surgery

## 2022-08-03 ENCOUNTER — Ambulatory Visit (INDEPENDENT_AMBULATORY_CARE_PROVIDER_SITE_OTHER): Payer: Medicare PPO | Admitting: Surgery

## 2022-08-03 VITALS — BP 140/70 | HR 84 | Resp 20 | Ht 65.0 in | Wt 111.0 lb

## 2022-08-03 DIAGNOSIS — I351 Nonrheumatic aortic (valve) insufficiency: Secondary | ICD-10-CM

## 2022-08-03 DIAGNOSIS — Z952 Presence of prosthetic heart valve: Secondary | ICD-10-CM

## 2022-08-03 DIAGNOSIS — J90 Pleural effusion, not elsewhere classified: Secondary | ICD-10-CM

## 2022-08-03 DIAGNOSIS — R0602 Shortness of breath: Secondary | ICD-10-CM | POA: Diagnosis not present

## 2022-08-03 MED ORDER — TORSEMIDE 5 MG PO TABS: 60.0000 mg | ORAL_TABLET | Freq: Every day | ORAL | Status: DC

## 2022-08-03 NOTE — Progress Notes (Signed)
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  Her initial postoperative course was complicated by atrial flutter underneath her permanent pacemaker.  She was started on amiodarone and EP consult was requested and they adjusted her intrinsic permanent pacemaker to assist with rate control.  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  She return to the office in early June with a chest x-ray showing moderate to large bilateral pleural effusions with progressive shortness of breath.  She underwent bilateral thoracenteses removing 700 cc of fluid on the right on 05/23/2022 and 550 cc from the left on 05/24/2022.  Her chest x-ray showed complete drainage of the left pleural effusion but there was still some right pleural effusion at the conclusion of the right thoracentesis.  She was started on torsemide 40 mg daily and continue to take that.   I saw her in the office on 05/31/2022 and she was doing well clinically.  Chest x-ray showed a small to moderate-sized right pleural effusion and complete clearing of the left pleural effusion we decided to continue her on the current dose of torsemide 40 mg daily.  We continued her on amiodarone as well as Coumadin.  She saw Dr. Lovena Le in the office on 06/17/2022 and he decided to continue her on amiodarone for now.  I last saw her in the office on 06/30/2022 at which time she was doing well and had just returned from vacation.  She was feeling well and we decided to continue her on torsemide 40 mg daily in addition to Coumadin and amiodarone.  She saw Dr. Percival Spanish in the office on 07/21/2022 and her amiodarone was discontinued.  Her metoprolol was increased to  100 mg twice daily.  She was continued on Coumadin.  Her last INR on 07/21/2022 was 2.4.  She said that she was doing fairly well but then over a 4-day period last week noted an increase in her weight with development of edema in her lower legs and feet.  She had difficulty getting her shoes on.  She also noticed more shortness of breath.  She decided on her own to take some additional Lasix in addition to the torsemide she was already on.  She said that this resulted in her weight decreasing back to her baseline and resolution of the lower extremity edema.  Her breathing really has not changed.  She can be up moving around but gets short of breath with any significant exertion.  Current Outpatient Medications  Medication Sig Dispense Refill   acetaminophen (TYLENOL) 325 MG tablet Take 2 tablets (650 mg total) by mouth 2 (two) times daily as needed for moderate pain or headache.     alendronate (FOSAMAX) 70 MG tablet Take 1 tablet (70 mg total) by mouth once a week. (Patient taking differently: Take 70 mg by mouth once a week. Sunday) 12 tablet 3   atorvastatin (LIPITOR) 20 MG tablet Take 1 tablet (20 mg total) by mouth daily. 30 tablet 3   Blood Glucose Monitoring Suppl (ACCU-CHEK GUIDE) w/Device KIT 1 Units by Does not apply route daily. 1 kit 0   Calcium Carbonate (CALCIUM 600 PO) Take 600 mg by mouth 2 (two) times daily.  Lunch and Dinner     chlorthalidone (HYGROTON) 25 MG tablet TAKE 1 TABLET BY MOUTH ONCE A DAY 90 tablet 1   CREON 36000-114000 units CPEP capsule Take 36,000 Units by mouth 3 (three) times daily before meals.     esomeprazole (NEXIUM) 40 MG capsule Take 40 mg by mouth daily before breakfast.   12   fluticasone (FLONASE) 50 MCG/ACT nasal spray Place 2 sprays into both nostrils daily. (Patient taking differently: Place 2 sprays into both nostrils daily as needed for rhinitis.) 16 g 6   furosemide (LASIX) 40 MG tablet TAKE 2 TABLETS BY MOUTH DAILY 180 tablet 2   glucose blood  (ACCU-CHEK GUIDE) test strip USE TO TEST BLOOD SUGARS EVERY MORNING 100 each 3   Lancets (ACCU-CHEK SOFT TOUCH) lancets Check BS QAM 100 each 3   Lancets Misc. (ACCU-CHEK SOFTCLIX LANCET DEV) KIT Check BS QAM 1 kit 0   levothyroxine (SYNTHROID) 50 MCG tablet TAKE 1 TABLET BY MOUTH ONCE A DAY (Patient taking differently: Take 50 mcg by mouth daily before breakfast.) 90 tablet 3   metoprolol tartrate (LOPRESSOR) 100 MG tablet Take 1 tablet (100 mg total) by mouth 2 (two) times daily. 180 tablet 3   Multiple Vitamin (MULTIVITAMIN WITH MINERALS) TABS tablet Take 1 tablet by mouth daily with lunch. Centrum Silver.     mupirocin ointment (BACTROBAN) 2 % Apply to healing wounds QD-BID until healed. 22 g 0   potassium chloride SA (KLOR-CON M) 20 MEQ tablet TAKE 1 TABLET BY MOUTH ONCE A DAY 30 tablet 10   torsemide 40 MG TABS Take 40 mg by mouth daily. 30 tablet 1   traMADol (ULTRAM) 50 MG tablet Take 1 tablet (50 mg total) by mouth every 6 (six) hours as needed for moderate pain. 28 tablet 0   warfarin (COUMADIN) 5 MG tablet TAKE 1 TO 2 TABLETS BY MOUTH DAILY AS DIRECTED BY COUMADIN CLINIC 120 tablet 0   No current facility-administered medications for this visit.     Physical Exam: BP (!) 140/70   Pulse 84   Resp 20   Ht $R'5\' 5"'XZ$  (1.651 m)   Wt 111 lb (50.3 kg)   SpO2 98%   BMI 18.47 kg/m  She looks well. Cardiac exam shows a regular rate and rhythm with normal heart sounds.  There is no murmur. Lung exam reveals decreased breath sounds over both lower lobes. The chest incision is well-healed and the sternum is stable. There is no lower extremity edema.  Diagnostic Tests:  Narrative & Impression  CLINICAL DATA:  Shortness of breath, history of atrial valve replacement April 07, 2022   EXAM: CHEST - 2 VIEW   COMPARISON:  May 31, 2022, April 06, 2022   FINDINGS: The cardiomediastinal silhouette is unchanged in contour status post median sternotomy and atrial valve replacement. Intact  left chest wall dual lead pacemaker.   Persistent pulmonary opacities involving the right middle lobe and bilateral lower lobes. No new focal pulmonary opacity.   Unchanged small right-greater-than-left pleural effusions. No pneumothorax.   Surgical clips visualized in the upper left abdomen.   Multilevel degenerative changes of the thoracic spine.   IMPRESSION: 1. Persistent pulmonary opacities in the right middle lobe and bilateral lower lobes, similar in appearance to June 2023. 2. Unchanged small bilateral pleural effusions.     Electronically Signed   By: Beryle Flock M.D.   On: 08/03/2022 08:51      Impression:  She is now about 4 months out  from her surgery.  She continues to have some volume overload at times with small bilateral pleural effusions and some lower extremity edema responsive to increased diuretic.  There is some persistent pulmonary opacity in the right middle lobe and both lower lobes which I suspect is probably atelectasis and possibly some loculated fluid.  I suspect her shortness of breath is probably related to her pleural effusions and compressive atelectasis of her lower lobes.  We discussed the alternatives of doing thoracentesis again versus increasing her diuretic to see if that resolves.  She would rather try an increased diuretic dose at this time.  I think that is reasonable because I am concerned that if we do a thoracentesis again the fluid will continue to recur.  I increased her torsemide dose to 60 mg daily.  Her last echo on 05/26/2022 showed a mean gradient across the old prosthetic mitral valve of 7 mmHg and her TEE in February 2023 showed a mean gradient of 3 mmHg.  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  I am not sure how to reconcile these differences.  Her  left ventricular ejection fraction was 55 to 60% on her most recent echo.  RV function appears normal with mild to moderate tricuspid valve regurgitation.   I think her lower extremity edema and pleural effusions are related to persistent congestive heart failure which will require ongoing medical management.  If her effusions do not improve with increased diuretic then we will consider repeat thoracentesis.  Plan:  Her torsemide was increased today to 60 mg daily.  I will plan to see her back in 1 month with a chest x-ray to follow-up on her pleural effusions.  She will continue to track her weight daily.  She has a follow-up appointment with Dr. Dennard Schaumann in September.   I spent 20 minutes performing this established patient evaluation and > 50% of this time was spent face to face counseling and coordinating the care of this patient's persistent pleural effusions status post aortic valve replacement.   [name], MD [facility] [phone]

## 2022-08-04 ENCOUNTER — Encounter: Payer: Medicare PPO | Admitting: *Deleted

## 2022-08-04 DIAGNOSIS — Z952 Presence of prosthetic heart valve: Secondary | ICD-10-CM | POA: Diagnosis not present

## 2022-08-04 NOTE — Progress Notes (Signed)
Daily Session Note  Patient Details  Name: Patricia [name] MRN: [id number] Date of Birth: 1950-12-16 Referring Provider:   Flowsheet Row Cardiac Rehab from 05/12/2022 in [facility] Cardiac and Pulmonary Rehab  Referring Provider Eleonore Chiquito MD       Encounter Date: 08/04/2022  Check In:  Session Check In - 08/04/22 0954       Check-In   Supervising physician immediately available to respond to emergencies See telemetry face sheet for immediately available ER MD    Location [facility]-Cardiac & Pulmonary Rehab    Staff Present Heath Lark, RN, BSN, CCRP;Melissa [place], RDN, LDN;Joseph Harlan, RCP,RRT,BSRT;Noah Tickle, BS, Exercise Physiologist    Virtual Visit No    Medication changes reported     No    Fall or balance concerns reported    No    Warm-up and Cool-down Performed on first and last piece of equipment    Resistance Training Performed Yes    VAD Patient? No    PAD/SET Patient? No      Pain Assessment   Currently in Pain? No/denies                Social History   Tobacco Use  Smoking Status Never  Smokeless Tobacco Never    Goals Met:  Independence with exercise equipment Exercise tolerated well No report of concerns or symptoms today  Goals Unmet:  Not Applicable  Comments: Pt able to follow exercise prescription today without complaint.  Will continue to monitor for progression.    Dr. Emily Filbert is Medical Director for [place].  Dr. Ottie Glazier is Medical Director for [facility] Pulmonary Rehabilitation.

## 2022-08-06 ENCOUNTER — Encounter: Payer: Self-pay | Admitting: Dermatology

## 2022-08-08 ENCOUNTER — Encounter: Payer: Medicare PPO | Admitting: *Deleted

## 2022-08-08 DIAGNOSIS — Z952 Presence of prosthetic heart valve: Secondary | ICD-10-CM

## 2022-08-08 NOTE — Progress Notes (Signed)
Daily Session Note  Patient Details  Name: Patricia [name] MRN: [id number] Date of Birth: 1951/12/01 Referring Provider:   Flowsheet Row Cardiac Rehab from 05/12/2022 in [facility] Cardiac and Pulmonary Rehab  Referring Provider Eleonore Chiquito MD       Encounter Date: 08/08/2022  Check In:  Session Check In - 08/08/22 0934       Check-In   Supervising physician immediately available to respond to emergencies See telemetry face sheet for immediately available ER MD    Location [facility]-Cardiac & Pulmonary Rehab    Staff Present Heath Lark, RN, BSN, CCRP;Joseph [place], RCP,RRT,BSRT;Kelly [place], [place], ACSM CEP, Exercise Physiologist    Virtual Visit No    Medication changes reported     No    Fall or balance concerns reported    No    Warm-up and Cool-down Performed on first and last piece of equipment    Resistance Training Performed Yes    VAD Patient? No    PAD/SET Patient? No      Pain Assessment   Currently in Pain? No/denies                Social History   Tobacco Use  Smoking Status Never  Smokeless Tobacco Never    Goals Met:  Independence with exercise equipment Exercise tolerated well No report of concerns or symptoms today  Goals Unmet:  Not Applicable  Comments: Pt able to follow exercise prescription today without complaint.  Will continue to monitor for progression.    Dr. Emily Filbert is Medical Director for [place].  Dr. Ottie Glazier is Medical Director for [facility] Pulmonary Rehabilitation.

## 2022-08-09 ENCOUNTER — Encounter: Payer: Medicare PPO | Admitting: *Deleted

## 2022-08-09 DIAGNOSIS — Z952 Presence of prosthetic heart valve: Secondary | ICD-10-CM | POA: Diagnosis not present

## 2022-08-09 NOTE — Progress Notes (Signed)
Daily Session Note  Patient Details  Name: Patricia [name] MRN: [id number] Date of Birth: 1951/01/23 Referring Provider:   Flowsheet Row Cardiac Rehab from 05/12/2022 in [facility] Cardiac and Pulmonary Rehab  Referring Provider Eleonore Chiquito MD       Encounter Date: 08/09/2022  Check In:  Session Check In - 08/09/22 0926       Check-In   Supervising physician immediately available to respond to emergencies See telemetry face sheet for immediately available ER MD    Location [facility]-Cardiac & Pulmonary Rehab    Staff Present Nyoka Cowden, RN, BSN, [place], BS, ACSM CEP, Exercise Physiologist;Jessica [place], MA, RCEP, CCRP, CCET    Virtual Visit No    Medication changes reported     No    Fall or balance concerns reported    No    Tobacco Cessation No Change    Warm-up and Cool-down Performed on first and last piece of equipment    Resistance Training Performed Yes    VAD Patient? No    PAD/SET Patient? No      Pain Assessment   Currently in Pain? No/denies                Social History   Tobacco Use  Smoking Status Never  Smokeless Tobacco Never    Goals Met:  Independence with exercise equipment Exercise tolerated well No report of concerns or symptoms today  Goals Unmet:  Not Applicable  Comments: Pt able to follow exercise prescription today without complaint.  Will continue to monitor for progression.    Dr. Emily Filbert is Medical Director for [place].  Dr. Ottie Glazier is Medical Director for [facility] Pulmonary Rehabilitation.

## 2022-08-10 ENCOUNTER — Encounter: Payer: Self-pay | Admitting: Dermatology

## 2022-08-10 ENCOUNTER — Ambulatory Visit: Payer: Medicare PPO | Admitting: Dermatology

## 2022-08-10 DIAGNOSIS — C44319 Basal cell carcinoma of skin of other parts of face: Secondary | ICD-10-CM | POA: Diagnosis not present

## 2022-08-10 DIAGNOSIS — L570 Actinic keratosis: Secondary | ICD-10-CM | POA: Diagnosis not present

## 2022-08-10 DIAGNOSIS — C44519 Basal cell carcinoma of skin of other part of trunk: Secondary | ICD-10-CM | POA: Diagnosis not present

## 2022-08-10 DIAGNOSIS — C4491 Basal cell carcinoma of skin, unspecified: Secondary | ICD-10-CM

## 2022-08-10 DIAGNOSIS — D0471 Carcinoma in situ of skin of right lower limb, including hip: Secondary | ICD-10-CM

## 2022-08-10 DIAGNOSIS — D099 Carcinoma in situ, unspecified: Secondary | ICD-10-CM

## 2022-08-10 DIAGNOSIS — Q828 Other specified congenital malformations of skin: Secondary | ICD-10-CM

## 2022-08-10 DIAGNOSIS — Z1231 Encounter for screening mammogram for malignant neoplasm of breast: Secondary | ICD-10-CM | POA: Diagnosis not present

## 2022-08-10 LAB — HM MAMMOGRAPHY

## 2022-08-10 NOTE — Patient Instructions (Addendum)
Wound Care Instructions  Cleanse wound gently with soap and water once a day then pat dry with clean gauze. Apply a thin coat of Petrolatum (petroleum jelly, "Vaseline") over the wound (unless you have an allergy to this). We recommend that you use a new, sterile tube of Vaseline. Do not pick or remove scabs. Do not remove the yellow or white "healing tissue" from the base of the wound.  Cover the wound with fresh, clean, nonstick gauze and secure with paper tape. You may use Band-Aids in place of gauze and tape if the wound is small enough, but would recommend trimming much of the tape off as there is often too much. Sometimes Band-Aids can irritate the skin.  You should call the office for your biopsy report after 1 week if you have not already been contacted.  If you experience any problems, such as abnormal amounts of bleeding, swelling, significant bruising, significant pain, or evidence of infection, please call the office immediately.  FOR ADULT SURGERY PATIENTS: If you need something for pain relief you may take 1 extra strength Tylenol (acetaminophen) AND 2 Ibuprofen (200mg each) together every 4 hours as needed for pain. (do not take these if you are allergic to them or if you have a reason you should not take them.) Typically, you may only need pain medication for 1 to 3 days.     Due to recent changes in healthcare laws, you may see results of your pathology and/or laboratory studies on MyChart before the doctors have had a chance to review them. We understand that in some cases there may be results that are confusing or concerning to you. Please understand that not all results are received at the same time and often the doctors may need to interpret multiple results in order to provide you with the best plan of care or course of treatment. Therefore, we ask that you please give us 2 business days to thoroughly review all your results before contacting the office for clarification. Should  we see a critical lab result, you will be contacted sooner.   If You Need Anything After Your Visit  If you have any questions or concerns for your doctor, please call our main line at [phone] and press option 4 to reach your doctor's medical assistant. If no one answers, please leave a voicemail as directed and we will return your call as soon as possible. Messages left after 4 pm will be answered the following business day.   You may also send us a message via MyChart. We typically respond to MyChart messages within 1-2 business days.  For prescription refills, please ask your pharmacy to contact our office. Our fax number is [phone].  If you have an urgent issue when the clinic is closed that cannot wait until the next business day, you can page your doctor at the number below.    Please note that while we do our best to be available for urgent issues outside of office hours, we are not available 24/7.   If you have an urgent issue and are unable to reach us, you may choose to seek medical care at your doctor's office, retail clinic, urgent care center, or emergency room.  If you have a medical emergency, please immediately call 911 or go to the emergency department.  Pager Numbers  - Dr. Kowalski: [phone]  - Dr. Moye: [phone]  - Dr. Stewart: [phone]  In the event of inclement weather, please call our main line at   [phone] for an update on the status of any delays or closures.  Dermatology Medication Tips: Please keep the boxes that topical medications come in in order to help keep track of the instructions about where and how to use these. Pharmacies typically print the medication instructions only on the boxes and not directly on the medication tubes.   If your medication is too expensive, please contact our office at [phone] option 4 or send us a message through MyChart.   We are unable to tell what your co-pay for medications will be in  advance as this is different depending on your insurance coverage. However, we may be able to find a substitute medication at lower cost or fill out paperwork to get insurance to cover a needed medication.   If a prior authorization is required to get your medication covered by your insurance company, please allow us 1-2 business days to complete this process.  Drug prices often vary depending on where the prescription is filled and some pharmacies may offer cheaper prices.  The website www.goodrx.com contains coupons for medications through different pharmacies. The prices here do not account for what the cost may be with help from insurance (it may be cheaper with your insurance), but the website can give you the price if you did not use any insurance.  - You can print the associated coupon and take it with your prescription to the pharmacy.  - You may also stop by our office during regular business hours and pick up a GoodRx coupon card.  - If you need your prescription sent electronically to a different pharmacy, notify our office through [facility] MyChart or by phone at [phone] option 4.     Si Usted Necesita Algo Despus de Su Visita  Tambin puede enviarnos un mensaje a travs de MyChart. Por lo general respondemos a los mensajes de MyChart en el transcurso de 1 a 2 das hbiles.  Para renovar recetas, por favor pida a su farmacia que se ponga en contacto con nuestra oficina. Nuestro nmero de fax es el [phone].  Si tiene un asunto urgente cuando la clnica est cerrada y que no puede esperar hasta el siguiente da hbil, puede llamar/localizar a su doctor(a) al nmero que aparece a continuacin.   Por favor, tenga en cuenta que aunque hacemos todo lo posible para estar disponibles para asuntos urgentes fuera del horario de oficina, no estamos disponibles las 24 horas del da, los 7 das de la semana.   Si tiene un problema urgente y no puede comunicarse con nosotros, puede  optar por buscar atencin mdica  en el consultorio de su doctor(a), en una clnica privada, en un centro de atencin urgente o en una sala de emergencias.  Si tiene una emergencia mdica, por favor llame inmediatamente al 911 o vaya a la sala de emergencias.  Nmeros de bper  - Dr. Kowalski: [phone]  - Dra. Moye: [phone]  - Dra. Stewart: [phone]  En caso de inclemencias del tiempo, por favor llame a nuestra lnea principal al [phone] para una actualizacin sobre el estado de cualquier retraso o cierre.  Consejos para la medicacin en dermatologa: Por favor, guarde las cajas en las que vienen los medicamentos de uso tpico para ayudarle a seguir las instrucciones sobre dnde y cmo usarlos. Las farmacias generalmente imprimen las instrucciones del medicamento slo en las cajas y no directamente en los tubos del medicamento.   Si su medicamento es muy caro, por favor, pngase en contacto con   nuestra oficina llamando al [phone] y presione la opcin 4 o envenos un mensaje a travs de MyChart.   No podemos decirle cul ser su copago por los medicamentos por adelantado ya que esto es diferente dependiendo de la cobertura de su seguro. Sin embargo, es posible que podamos encontrar un medicamento sustituto a menor costo o llenar un formulario para que el seguro cubra el medicamento que se considera necesario.   Si se requiere una autorizacin previa para que su compaa de seguros cubra su medicamento, por favor permtanos de 1 a 2 das hbiles para completar este proceso.  Los precios de los medicamentos varan con frecuencia dependiendo del lugar de dnde se surte la receta y alguna farmacias pueden ofrecer precios ms baratos.  El sitio web www.goodrx.com tiene cupones para medicamentos de diferentes farmacias. Los precios aqu no tienen en cuenta lo que podra costar con la ayuda del seguro (puede ser ms barato con su seguro), pero el sitio web puede darle el  precio si no utiliz ningn seguro.  - Puede imprimir el cupn correspondiente y llevarlo con su receta a la farmacia.  - Tambin puede pasar por nuestra oficina durante el horario de atencin regular y recoger una tarjeta de cupones de GoodRx.  - Si necesita que su receta se enve electrnicamente a una farmacia diferente, informe a nuestra oficina a travs de MyChart de [place] o por telfono llamando al [phone] y presione la opcin 4.  

## 2022-08-10 NOTE — Progress Notes (Unsigned)
Follow-Up Visit   Subjective  Patricia [name] is a 71 y.o. female who presents for the following: Procedure (Patient here today to treat SCCis at right thigh, BCC at right forehead and BCC at left buttock. ) and Actinic Keratosis (Left lower leg).   The following portions of the chart were reviewed this encounter and updated as appropriate:   Tobacco  Allergies  Meds  Problems  Med Hx  Surg Hx  Fam Hx      Review of Systems:  No other skin or systemic complaints except as noted in HPI or Assessment and Plan.  Objective  Well appearing patient in no apparent distress; mood and affect are within normal limits.  A focused examination was performed including legs, face, buttock. Relevant physical exam findings are noted in the Assessment and Plan.  Left Lower Leg - Anterior Erythematous thin papules/macules with gritty scale.   right anterior thigh Pink papule  right lateral forehead Pink papule  left buttock Healing bx site  right medial ankle Pink papule with peripheral scale    Assessment & Plan  AK (actinic keratosis) Left Lower Leg - Anterior  Actinic keratoses are precancerous spots that appear secondary to cumulative UV radiation exposure/sun exposure over time. They are chronic with expected duration over 1 year. A portion of actinic keratoses will progress to squamous cell carcinoma of the skin. It is not possible to reliably predict which spots will progress to skin cancer and so treatment is recommended to prevent development of skin cancer.  Recommend daily broad spectrum sunscreen SPF 30+ to sun-exposed areas, reapply every 2 hours as needed.  Recommend staying in the shade or wearing long sleeves, sun glasses (UVA+UVB protection) and wide brim hats (4-inch brim around the entire circumference of the hat). Call for new or changing lesions.  Prior to procedure, discussed risks of blister formation, small wound, skin dyspigmentation, or rare scar  following cryotherapy. Recommend Vaseline ointment to treated areas while healing.   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  Destruction of lesion  Destruction method: electrodesiccation and curettage   Informed consent: discussed and consent obtained   Timeout:  patient name, date of birth, surgical site, and procedure verified Anesthesia: the lesion was anesthetized in a standard fashion   Anesthetic:  1% lidocaine w/ epinephrine 1-100,000 buffered w/ 8.4% NaHCO3 Curettage performed in three different directions: Yes   Electrodesiccation performed over the curetted area: Yes   Curettage cycles:  3 Final wound size (cm):  1.4 Hemostasis achieved with:  electrodesiccation Outcome: patient tolerated procedure well with no complications   Post-procedure details: sterile dressing applied and wound care instructions given   Dressing type: petrolatum    Basal cell carcinoma (BCC) of skin of other part of face right lateral forehead  Destruction of lesion  Destruction method: electrodesiccation and curettage   Informed consent: discussed and consent obtained   Timeout:  patient name, date of birth, surgical site, and procedure verified Anesthesia: the lesion was anesthetized in a standard fashion   Anesthetic:  1% lidocaine w/ epinephrine 1-100,000 buffered w/ 8.4% NaHCO3 Curettage performed in three different directions: Yes   Electrodesiccation performed over the curetted  area: Yes   Curettage cycles:  3 Final wound size (cm):  1.7 Hemostasis achieved with:  electrodesiccation Outcome: patient tolerated procedure well with no complications    Post-procedure details: sterile dressing applied and wound care instructions given   Dressing type: petrolatum    Basal cell carcinoma (BCC), unspecified site left buttock  Destruction of lesion  Destruction method: electrodesiccation and curettage   Informed consent: discussed and consent obtained   Timeout:  patient name, date of birth, surgical site, and procedure verified Anesthesia: the lesion was anesthetized in a standard fashion   Anesthetic:  1% lidocaine w/ epinephrine 1-100,000 buffered w/ 8.4% NaHCO3 Curettage performed in three different directions: Yes   Electrodesiccation performed over the curetted area: Yes   Curettage cycles:  3 Final wound size (cm):  1.5 Hemostasis achieved with:  electrodesiccation Outcome: patient tolerated procedure well with no complications   Post-procedure details: sterile dressing applied and wound care instructions given   Dressing type: petrolatum    Porokeratosis right medial ankle  Benign-appearing.  Observation.  Call clinic for new or changing lesions.  Recommend daily use of broad spectrum spf 30+ sunscreen to sun-exposed areas.     Return for TBSE, as scheduled.  Graciella Belton, RMA, am acting as scribe for Forest Gleason, MD .  Documentation: I have reviewed the above documentation for accuracy and completeness, and I agree with the above.  Forest Gleason, MD

## 2022-08-11 ENCOUNTER — Encounter: Payer: Medicare PPO | Admitting: *Deleted

## 2022-08-11 DIAGNOSIS — Z952 Presence of prosthetic heart valve: Secondary | ICD-10-CM | POA: Diagnosis not present

## 2022-08-11 NOTE — Progress Notes (Signed)
Daily Session Note  Patient Details  Name: Patricia [name] MRN: [id number] Date of Birth: [birth year] Referring Provider:   Flowsheet Row Cardiac Rehab from 05/12/2022 in [facility] Cardiac [facility] Pulmonary Rehab  Referring Provider Eleonore Chiquito MD       Encounter Date: 08/11/2022  Check In:  Session Check In - 08/11/22 1235       Check-In   Supervising physician immediately available to respond to emergencies See telemetry face sheet for immediately available ER MD    Location [facility]-Cardiac & Pulmonary Rehab    Staff Present Nyoka Cowden, RN, BSN, Lauretta Grill, RCP,RRT,BSRT;Melissa Uriah, [place], LDN    Virtual Visit No    Medication changes reported     No    Fall or balance concerns reported    No    Tobacco Cessation No Change    Warm-up and Cool-down Performed on first and last piece of equipment    Resistance Training Performed Yes    VAD Patient? No    PAD/SET Patient? No      Pain Assessment   Currently in Pain? No/denies                Social History   Tobacco Use  Smoking Status Never  Smokeless Tobacco Never    Goals Met:  Independence with exercise equipment Exercise tolerated well No report of concerns or symptoms today  Goals Unmet:  Not Applicable  Comments: Pt able to follow exercise prescription today without complaint.  Will continue to monitor for progression.    Dr. Emily Filbert is Medical Director for [place].  Dr. Ottie Glazier is Medical Director for [facility] Pulmonary Rehabilitation.

## 2022-08-12 ENCOUNTER — Ambulatory Visit: Payer: Medicare PPO | Attending: Cardiology

## 2022-08-12 DIAGNOSIS — Z952 Presence of prosthetic heart valve: Secondary | ICD-10-CM | POA: Diagnosis not present

## 2022-08-12 DIAGNOSIS — Z7901 Long term (current) use of anticoagulants: Secondary | ICD-10-CM

## 2022-08-12 DIAGNOSIS — Z5181 Encounter for therapeutic drug level monitoring: Secondary | ICD-10-CM

## 2022-08-12 LAB — POCT INR: INR: 2.1 (ref 2.0–3.0)

## 2022-08-12 NOTE — Patient Instructions (Signed)
CONTINUE  taking 1 tablet daily EXCEPT 2 tablets on Mondays, Wednesdays, Saturdays.  Repeat INR 5 weeks. Coumadin Clinic# [phone]

## 2022-08-16 ENCOUNTER — Encounter: Payer: Medicare PPO | Attending: Cardiovascular Disease | Admitting: *Deleted

## 2022-08-16 DIAGNOSIS — Z952 Presence of prosthetic heart valve: Secondary | ICD-10-CM | POA: Diagnosis not present

## 2022-08-16 NOTE — Progress Notes (Signed)
Daily Session Note  Patient Details  Name: CHARLYE SPARE MRN: [id number] Date of Birth: Jun 08, 1951 Referring Provider:   Flowsheet Row Cardiac Rehab from 05/12/2022 in [facility] Cardiac and Pulmonary Rehab  Referring Provider Eleonore Chiquito MD       Encounter Date: 08/16/2022  Check In:  Session Check In - 08/16/22 7473       Check-In   Supervising physician immediately available to respond to emergencies See telemetry face sheet for immediately available ER MD    Location [facility]-Cardiac & Pulmonary Rehab    Staff Present Nyoka Cowden, RN, BSN, Fenton Foy, BS, Exercise Physiologist;Jessica [place], MA, RCEP, CCRP, CCET    Virtual Visit No    Medication changes reported     No    Fall or balance concerns reported    No    Tobacco Cessation No Change    Warm-up and Cool-down Performed on first and last piece of equipment    Resistance Training Performed Yes    VAD Patient? No    PAD/SET Patient? No      Pain Assessment   Currently in Pain? No/denies                Social History   Tobacco Use  Smoking Status Never  Smokeless Tobacco Never    Goals Met:  Independence with exercise equipment Exercise tolerated well No report of concerns or symptoms today  Goals Unmet:  Not Applicable  Comments: Pt able to follow exercise prescription today without complaint.  Will continue to monitor for progression.   Arieonna graduated today from  rehab with 36 sessions completed.  Details of the patient's exercise prescription and what She needs to do in order to continue the prescription and progress were discussed with patient.  Patient was given a copy of prescription and goals.  Patient verbalized understanding.  Sharron plans to continue to exercise by walking at home.   Dr. Emily Filbert is Medical Director for [place].  Dr. Ottie Glazier is Medical Director for [facility] Pulmonary Rehabilitation.

## 2022-08-16 NOTE — Progress Notes (Signed)
Discharge Progress Report  Patient Details  Name: Patricia [name] MRN: [id number] Date of Birth: [birth year] Referring Provider:   Flowsheet Row Cardiac Rehab from 05/12/2022 in [facility] Cardiac and Pulmonary Rehab  Referring Provider Eleonore Chiquito MD        Number of Visits: 36  Reason for Discharge:  Patient reached a stable level of exercise. Patient independent in their exercise. Patient has met program and personal goals.  Smoking History:  Social History   Tobacco Use  Smoking Status Never  Smokeless Tobacco Never    Diagnosis:  S/P aortic valve replacement  ADL UCSD:   Initial Exercise Prescription:  Initial Exercise Prescription - 05/12/22 1100       Date of Initial Exercise RX and Referring Provider   Date 05/12/22    Referring Provider Eleonore Chiquito MD      Oxygen   Maintain Oxygen Saturation 88% or higher      Treadmill   MPH 1.5    Grade 0.5    Minutes 15    METs 2.26      Recumbant Bike   Level 1    RPM 50    Watts 10    Minutes 15    METs 2      NuStep   Level 1    SPM 70    Minutes 15    METs 1.5      Biostep-RELP   Level 1    SPM 50    Minutes 15    METs 1.8      Track   Laps 15    Minutes 15    METs 1.82      Prescription Details   Frequency (times per week) 3    Duration Progress to 30 minutes of continuous aerobic without signs/symptoms of physical distress      Intensity   THRR 40-80% of Max Heartrate 114-138    Ratings of Perceived Exertion 11-13    Perceived Dyspnea 0-4      Progression   Progression Continue to progress workloads to maintain intensity without signs/symptoms of physical distress.      Resistance Training   Training Prescription Yes    Weight 3 lb    Reps 10-15             Discharge Exercise Prescription (Final Exercise Prescription Changes):  Exercise Prescription Changes - 08/01/22 1000       Response to Exercise   Blood Pressure (Admit) 122/84    Blood Pressure (Exit) 132/62     Heart Rate (Admit) 62 bpm    Heart Rate (Exercise) 90 bpm    Heart Rate (Exit) 87 bpm    Rating of Perceived Exertion (Exercise) 13    Symptoms none    Duration Continue with 30 min of aerobic exercise without signs/symptoms of physical distress.    Intensity THRR unchanged      Progression   Progression Continue to progress workloads to maintain intensity without signs/symptoms of physical distress.    Average METs 2.32      Resistance Training   Training Prescription Yes    Weight 2 lb    Reps 10-15      Interval Training   Interval Training No      Recumbant Bike   Level 3    Minutes 15    METs 2.79      NuStep   Level 4    Minutes 15    METs 2.3  Biostep-RELP   Level 2    Minutes 15    METs 2      Track   Laps 22    Minutes 15    METs 2.2      Home Exercise Plan   Plans to continue exercise at Home (comment)   walking, recumbent bike   Frequency Add 3 additional days to program exercise sessions.    Initial Home Exercises Provided 06/08/22      Oxygen   Maintain Oxygen Saturation 88% or higher             Functional Capacity:  6 Minute Walk     Row Name 05/12/22 1106 07/26/22 0931       6 Minute Walk   Phase Initial Discharge    Distance 878 feet 980 feet    Distance % Change -- 12 %    Distance Feet Change -- 105 ft    Walk Time 6 minutes 6 minutes    # of Rest Breaks 0 0    MPH 1.66 1.56    METS 2.63 2.53    RPE 15 12    Perceived Dyspnea  3 1    VO2 Peak 9.21 8.86    Symptoms Yes (comment) Yes (comment)    Comments fatigue, SOB mild lightheadedness    Resting HR 92 bpm 62 bpm    Resting BP 134/62 122/84    Resting Oxygen Saturation  98 % 94 %    Exercise Oxygen Saturation  during 6 min walk 94 % 94 %    Max Ex. HR 110 bpm 89 bpm    Max Ex. BP 154/74 118/60    2 Minute Post BP 124/70 104/68             Psychological, QOL, Others - Outcomes: PHQ 2/9:    08/01/2022    9:53 AM 05/12/2022   11:34 AM 03/22/2021    2:17 PM  12/19/2019    8:22 AM 02/09/2018   10:25 AM  Depression screen PHQ 2/9  Decreased Interest 0 0 0 0 0  Down, Depressed, Hopeless 0 0 0 0 0  PHQ - 2 Score 0 0 0 0 0  Altered sleeping 0 1     Tired, decreased energy 1 3     Change in appetite 0 3     Feeling bad or failure about yourself  0 0     Trouble concentrating 0 0     Moving slowly or fidgety/restless 0 1     Suicidal thoughts 0 0     PHQ-9 Score 1 8     Difficult doing work/chores Not difficult at all Somewhat difficult       Quality of Life:  Quality of Life - 08/01/22 0955       Quality of Life   Select Quality of Life      Quality of Life Scores   Health/Function Pre 27.2 %    Health/Function Post 24.92 %    Health/Function % Change -8.38 %    Socioeconomic Pre 25.19 %    Socioeconomic Post 30 %    Socioeconomic % Change  19.09 %    Psych/Spiritual Pre 30 %    Psych/Spiritual Post 30 %    Psych/Spiritual % Change 0 %    Family Pre 15 %    Family Post 15 %    Family % Change 0 %    GLOBAL Pre 25.26 %  Nutrition & Weight - Outcomes:  Pre Biometrics - 05/12/22 1127       Pre Biometrics   Height 5' 5.1" (1.654 m)    Weight 118 lb 3.2 oz (53.6 kg)    BMI (Calculated) 19.6    Single Leg Stand 3.3 seconds             Post Biometrics - 07/26/22 0936        Post  Biometrics   Height 5' 5.1" (1.654 m)    Weight 113 lb 11.2 oz (51.6 kg)    BMI (Calculated) 18.85    Single Leg Stand 4.78 seconds             Nutrition:  Nutrition Therapy & Goals - 07/25/22 0933       Nutrition Therapy   RD appointment deferred Yes   Pt would not like to meet with RD at this time. Will continue to follow up.   Diet Heart Healthy      Personal Nutrition Goals   Nutrition Goal Pt would not like to meet with RD at this time. Will continue to follow up.      Intervention Plan   Intervention Prescribe, educate and counsel regarding individualized specific dietary modifications aiming towards  targeted core components such as weight, hypertension, lipid management, diabetes, heart failure and other comorbidities.    Expected Outcomes Short Term Goal: Understand basic principles of dietary content, such as calories, fat, sodium, cholesterol and nutrients.;Long Term Goal: Adherence to prescribed nutrition plan.;Short Term Goal: A plan has been developed with personal nutrition goals set during dietitian appointment.           Goals reviewed with patient; copy given to patient.

## 2022-08-16 NOTE — Progress Notes (Signed)
Cardiac Individual Treatment Plan  Patient Details  Name: Patricia [name] MRN: [id number] Date of Birth: 09-Sep-1951 Referring Provider:   Flowsheet Row Cardiac Rehab from 05/12/2022 in [facility] Cardiac and Pulmonary Rehab  Referring Provider Eleonore Chiquito MD       Initial Encounter Date:  Flowsheet Row Cardiac Rehab from 05/12/2022 in [facility] Cardiac and Pulmonary Rehab  Date 05/12/22       Visit Diagnosis: S/P aortic valve replacement  Patient's Home Medications on Admission:  Current Outpatient Medications:    acetaminophen (TYLENOL) 325 MG tablet, Take 2 tablets (650 mg total) by mouth 2 (two) times daily as needed for moderate pain or headache., Disp: , Rfl:    alendronate (FOSAMAX) 70 MG tablet, Take 1 tablet (70 mg total) by mouth once a week. (Patient taking differently: Take 70 mg by mouth once a week. Sunday), Disp: 12 tablet, Rfl: 3   atorvastatin (LIPITOR) 20 MG tablet, Take 1 tablet (20 mg total) by mouth daily., Disp: 30 tablet, Rfl: 3   Blood Glucose Monitoring Suppl (ACCU-CHEK GUIDE) w/Device KIT, 1 Units by Does not apply route daily., Disp: 1 kit, Rfl: 0   Calcium Carbonate (CALCIUM 600 PO), Take 600 mg by mouth 2 (two) times daily. Lunch and Dinner, Disp: , Rfl:    chlorthalidone (HYGROTON) 25 MG tablet, TAKE 1 TABLET BY MOUTH ONCE A DAY, Disp: 90 tablet, Rfl: 1   CREON 36000-114000 units CPEP capsule, Take 36,000 Units by mouth 3 (three) times daily before meals., Disp: , Rfl:    esomeprazole (NEXIUM) 40 MG capsule, Take 40 mg by mouth daily before breakfast. , Disp: , Rfl: 12   fluticasone (FLONASE) 50 MCG/ACT nasal spray, Place 2 sprays into both nostrils daily. (Patient taking differently: Place 2 sprays into both nostrils daily as needed for rhinitis.), Disp: 16 g, Rfl: 6   glucose blood (ACCU-CHEK GUIDE) test strip, USE TO TEST BLOOD SUGARS EVERY MORNING, Disp: 100 each, Rfl: 3   Lancets (ACCU-CHEK SOFT TOUCH) lancets, Check BS QAM, Disp: 100 each, Rfl: 3   Lancets  Misc. (ACCU-CHEK SOFTCLIX LANCET DEV) KIT, Check BS QAM, Disp: 1 kit, Rfl: 0   levothyroxine (SYNTHROID) 50 MCG tablet, TAKE 1 TABLET BY MOUTH ONCE A DAY (Patient taking differently: Take 50 mcg by mouth daily before breakfast.), Disp: 90 tablet, Rfl: 3   metoprolol tartrate (LOPRESSOR) 100 MG tablet, Take 1 tablet (100 mg total) by mouth 2 (two) times daily., Disp: 180 tablet, Rfl: 3   Multiple Vitamin (MULTIVITAMIN WITH MINERALS) TABS tablet, Take 1 tablet by mouth daily with lunch. Centrum Silver., Disp: , Rfl:    mupirocin ointment (BACTROBAN) 2 %, Apply to healing wounds QD-BID until healed., Disp: 22 g, Rfl: 0   potassium chloride SA (KLOR-CON M) 20 MEQ tablet, TAKE 1 TABLET BY MOUTH ONCE A DAY, Disp: 30 tablet, Rfl: 10   traMADol (ULTRAM) 50 MG tablet, Take 1 tablet (50 mg total) by mouth every 6 (six) hours as needed for moderate pain., Disp: 28 tablet, Rfl: 0   warfarin (COUMADIN) 5 MG tablet, TAKE 1 TO 2 TABLETS BY MOUTH DAILY AS DIRECTED BY COUMADIN CLINIC, Disp: 120 tablet, Rfl: 0  Current Facility-Administered Medications:    torsemide (DEMADEX) tablet 60 mg, 60 mg, Oral, Daily, Bartle, Fernande Boyden, MD  Past Medical History: Past Medical History:  Diagnosis Date   Atrial flutter ([place])    Ablated 1998 Dr. Caryl Comes   Basal cell carcinoma 06/05/2019   R low back, [facility] 07/16/19  Basal cell carcinoma 04/15/2010   Right post shoulder   Basal cell carcinoma 04/15/2010   Mid back   Basal cell carcinoma (BCC) 09/29/2016   Left post shoulder. Superficial and nodular.   BCC (basal cell carcinoma) 07/21/2021   right mid back, [facility] 09/15/2021   BCC (basal cell carcinoma) 01/27/2022   left lower abdomen, [facility] 07/28/22   BCC (basal cell carcinoma) 07/28/2022   right lateral forehead, [facility] 08/10/22   BCC (basal cell carcinoma) 07/28/2022   left buttock, [facility] 08/10/22   Bleeding gastric ulcer    back in 2000   Breast cancer ([place])    COPD (chronic obstructive pulmonary disease) ([place])    from  radiation   GERD (gastroesophageal reflux disease)    History of basal cell carcinoma (BCC) 03/22/2021   right neck and right medial ear, Moh's   Hodgkin's lymphoma ([place])    Treated with radiation and chemo   Mitral regurgitation    pvc   Osteoporosis    lumbar verterbral fracture, left ankle fracture, dexa 2015   Pancreatitis    Pneumonia    Prediabetes    Presence of permanent cardiac pacemaker    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 09/13/2017   25 mm Medtronic Mosaic porcine stented bioprosthetic tissue valve   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   Left medial lower leg   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368  7.520   PCO2 arterial 32 - 48 mmHg - - 44  34.6  36.5  44.5  46.1  51.1  43.7  37.8  35.8  45.5  43.6  29.2   Bicarbonate 20.0 - 28.0 mmol/L - - 30.6  25.8  25.9  27.3  29.6  31.7  32.0  32.4  22.5  31.3  24.8  23.7   TCO2 22 - 32 mmol/L - - - $R'27  27  29  30  31  'MK$ 33  33  31  33  32  34  24  28  33  32  26  25   Acid-base deficit 0.0 - 2.0 mmol/L - - - 2.0  -  O2 Saturation % - - 98.5  100  100  100  100  100  100  100  81  100  100  99      Exercise Target Goals: Exercise Program Goal: Individual exercise prescription set using results from initial 6 min walk test and THRR while considering  patient's activity  barriers and safety.   Exercise Prescription Goal: Initial exercise prescription builds to 30-45 minutes a day of aerobic  activity, 2-3 days per week.  Home exercise guidelines will be given to patient during program as part of exercise prescription that the participant will acknowledge.   Education: Aerobic Exercise: - Group verbal and visual presentation on the components of exercise prescription. Introduces F.I.T.T principle from ACSM for exercise prescriptions.  Reviews F.I.T.T. principles of aerobic exercise including progression. Written material given at graduation. Flowsheet Row Cardiac Rehab from 08/11/2022 in [facility] Cardiac and Pulmonary Rehab  Date 06/16/22  Educator [facility]  Instruction Review Code 1- Verbalizes Understanding       Education: Resistance Exercise: - Group verbal and visual presentation on the components of exercise prescription. Introduces F.I.T.T principle from ACSM for exercise prescriptions  Reviews F.I.T.T. principles of resistance exercise including progression. Written material given at graduation. Flowsheet Row Cardiac Rehab from 08/11/2022 in [facility] Cardiac and Pulmonary Rehab  Date 06/30/22  Educator [facility]  Instruction Review Code 1- Verbalizes Understanding        Education: Exercise & Equipment Safety: - Individual verbal instruction and demonstration of equipment use and safety with use of the equipment. Flowsheet Row Cardiac Rehab from 08/11/2022 in [facility] Cardiac and Pulmonary Rehab  Education need identified 05/12/22       Education: Exercise Physiology & General Exercise Guidelines: - Group verbal and written instruction with models to review the exercise physiology of the cardiovascular system and associated critical values. Provides general exercise guidelines with specific guidelines to those with heart or lung disease.  Flowsheet Row Cardiac Rehab from 08/11/2022 in [facility] Cardiac and Pulmonary Rehab  Date 06/09/22  Educator NT  Instruction Review  Code 1- [employer] Understanding       Education: Flexibility, Balance, Mind/Body Relaxation: - Group verbal and visual presentation with interactive activity on the components of exercise prescription. Introduces F.I.T.T principle from ACSM for exercise prescriptions. Reviews F.I.T.T. principles of flexibility and balance exercise training including progression. Also discusses the mind body connection.  Reviews various relaxation techniques to help reduce and manage stress (i.e. Deep breathing, progressive muscle relaxation, and visualization). Balance handout provided to take home. Written material given at graduation. Flowsheet Row Cardiac Rehab from 08/11/2022 in [facility] Cardiac and Pulmonary Rehab  Date 06/30/22  Educator [facility]  Instruction Review Code 1- Verbalizes Understanding       Activity Barriers & Risk Stratification:  Activity Barriers & Cardiac Risk Stratification - 05/12/22 1108       Activity Barriers & Cardiac Risk Stratification   Activity Barriers Arthritis;Joint Problems;Shortness of Breath;Deconditioning;Muscular Weakness   broke left ankle (plate/screws), bilateral knee arthritis   Cardiac Risk Stratification Moderate             6 Minute Walk:  6 Minute Walk     Row Name 05/12/22 1106 07/26/22 0931       6 Minute Walk   Phase Initial Discharge    Distance 878 feet 980 feet    Distance % Change -- 12 %    Distance Feet Change -- 105 ft    Walk Time 6 minutes 6 minutes    # of Rest Breaks 0 0    MPH 1.66 1.56    METS 2.63 2.53    RPE 15 12    Perceived Dyspnea  3 1    VO2 Peak 9.21 8.86    Symptoms Yes (comment) Yes (comment)    Comments fatigue, SOB mild lightheadedness    Resting HR 92 bpm 62 bpm    Resting BP 134/62 122/84    Resting Oxygen Saturation  98 % 94 %    Exercise Oxygen Saturation  during 6 min walk 94 % 94 %    Max Ex. HR 110 bpm 89 bpm    Max Ex. BP 154/74 118/60    2 Minute Post BP 124/70 104/68             Oxygen Initial  Assessment:   Oxygen Re-Evaluation:   Oxygen Discharge (Final Oxygen Re-Evaluation):   Initial Exercise Prescription:  Initial Exercise Prescription - 05/12/22 1100       Date of Initial Exercise RX and Referring Provider   Date 05/12/22    Referring Provider Eleonore Chiquito MD      Oxygen   Maintain Oxygen Saturation 88% or higher      Treadmill   MPH 1.5    Grade 0.5    Minutes 15    METs 2.26      Recumbant Bike   Level 1    RPM 50    Watts 10    Minutes 15    METs 2      NuStep   Level 1    SPM 70    Minutes 15    METs 1.5      Biostep-RELP   Level 1    SPM 50    Minutes 15    METs 1.8      Track   Laps 15    Minutes 15    METs 1.82      Prescription Details   Frequency (times per week) 3    Duration Progress to 30 minutes of continuous aerobic without signs/symptoms of physical distress      Intensity   THRR 40-80% of Max Heartrate 114-138    Ratings of Perceived Exertion 11-13    Perceived Dyspnea 0-4      Progression   Progression Continue to progress workloads to maintain intensity without signs/symptoms of physical distress.      Resistance Training   Training Prescription Yes    Weight 3 lb    Reps 10-15             Perform Capillary Blood Glucose checks as needed.  Exercise Prescription Changes:   Exercise Prescription Changes     Row Name 05/12/22 1100 05/24/22 0800 06/06/22 0900 06/20/22 1400 07/05/22 0900     Response to Exercise   Blood Pressure (Admit) 134/62 102/60 112/60 102/58 116/60   Blood Pressure (Exercise) 154/74 118/64 118/60 104/60 --   Blood Pressure (Exit) 124/70 112/66 96/60 124/70 100/80   Heart Rate (Admit) 92 bpm 86 bpm 85 bpm 85 bpm 86 bpm   Heart Rate (Exercise) 110 bpm 95 bpm 96 bpm 93 bpm 94 bpm   Heart Rate (Exit) 103 bpm 89 bpm 94 bpm 87 bpm 86 bpm   Oxygen Saturation (Admit) 98 % -- -- -- --   Oxygen Saturation (Exercise) 94 % -- -- -- --   Oxygen Saturation (Exit) 98 % -- -- -- --   Rating  of Perceived Exertion (Exercise) 15 12 12 13 12    Perceived Dyspnea (Exercise) 3 -- -- -- --   Symptoms fatigue, SOB none none none none   Comments 6MWT third full day of exercise -- -- --   Duration Progress to 30 minutes of  aerobic without signs/symptoms of physical distress Progress to 30 minutes of  aerobic without signs/symptoms of physical distress Progress to 30 minutes of  aerobic without signs/symptoms of physical distress Continue with 30 min of  aerobic exercise without signs/symptoms of physical distress. Continue with 30 min of aerobic exercise without signs/symptoms of physical distress.   Intensity THRR unchanged THRR unchanged THRR unchanged THRR unchanged THRR unchanged     Progression   Progression Continue to progress workloads to maintain intensity without signs/symptoms of physical distress. Continue to progress workloads to maintain intensity without signs/symptoms of physical distress. Continue to progress workloads to maintain intensity without signs/symptoms of physical distress. Continue to progress workloads to maintain intensity without signs/symptoms of physical distress. Continue to progress workloads to maintain intensity without signs/symptoms of physical distress.   Average METs 2.63 2.34 2.27 2.56 2.52     Resistance Training   Training Prescription -- Yes Yes Yes Yes   Weight -- 1 lb 2 lb 2 lb 2 lb   Reps -- 10-15 10-15 10-15 10-15     Interval Training   Interval Training -- No No No No     Recumbant Bike   Level -- 1 1 -- 1   Watts -- 12 16 -- 13   Minutes -- 15 15 -- 15   METs -- 2.94 3.05 -- 2.8     NuStep   Level -- $Remove'3 3 3 3   'VDrrVVE$ Minutes -- $RemoveBe'15 15 15 15   'ixYiIdCFR$ METs -- 2.1 2.1 2.5 2.3     Biostep-RELP   Level -- -- 1 -- --   Minutes -- -- 15 -- --   METs -- -- 2 -- --     Track   Laps -- $Remov'24 28 30 30   'tTgSgF$ Minutes -- $RemoveBe'15 15 15 15   'dXqfORzqW$ METs -- 2.31 2 2.63 2.63     Home Exercise Plan   Plans to continue exercise at -- -- -- Home (comment)  walking, recumbent  bike Home (comment)  walking, recumbent bike   Frequency -- -- -- Add 3 additional days to program exercise sessions. Add 3 additional days to program exercise sessions.   Initial Home Exercises Provided -- -- -- 06/08/22 06/08/22     Oxygen   Maintain Oxygen Saturation -- 88% or higher 88% or higher 88% or higher 88% or higher    Row Name 07/18/22 1100 08/01/22 1000           Response to Exercise   Blood Pressure (Admit) 102/62 122/84      Blood Pressure (Exit) 106/72 132/62      Heart Rate (Admit) 77 bpm 62 bpm      Heart Rate (Exercise) 93 bpm 90 bpm      Heart Rate (Exit) 84 bpm 87 bpm      Rating of Perceived Exertion (Exercise) 15 13      Symptoms none none      Duration Continue with 30 min of aerobic exercise without signs/symptoms of physical distress. Continue with 30 min of aerobic exercise without signs/symptoms of physical distress.      Intensity THRR unchanged THRR unchanged        Progression   Progression Continue to progress workloads to maintain intensity without signs/symptoms of physical distress. Continue to progress workloads to maintain intensity without signs/symptoms of physical distress.      Average METs 2.82 2.32        Resistance Training   Training Prescription Yes Yes      Weight 2 lb 2 lb      Reps 10-15 10-15        Interval Training   Interval Training No No  Treadmill   MPH 3 --      Grade 4.5 --      Minutes 15 --      METs 5.16 --        Recumbant Bike   Level 5 3      Minutes 15 15      METs 2.81 2.79        NuStep   Level 3 4      Minutes 15 15      METs 2.63 2.3        Biostep-RELP   Level -- 2      Minutes -- 15      METs -- 2        Track   Laps 30 22      Minutes 15 15      METs 2.63 2.2        Home Exercise Plan   Plans to continue exercise at -- Home (comment)  walking, recumbent bike      Frequency -- Add 3 additional days to program exercise sessions.      Initial Home Exercises Provided -- 06/08/22         Oxygen   Maintain Oxygen Saturation -- 88% or higher               Exercise Comments:   Exercise Comments     Row Name 05/16/22 0926 08/16/22 1018         Exercise Comments First full day of exercise!  Patient was oriented to gym and equipment including functions, settings, policies, and procedures.  Patient's individual exercise prescription and treatment plan were reviewed.  All starting workloads were established based on the results of the 6 minute walk test done at initial orientation visit.  The plan for exercise progression was also introduced and progression will be customized based on patient's performance and goals. Ivett graduated today from  rehab with 36 sessions completed.  Details of the patient's exercise prescription and what She needs to do in order to continue the prescription and progress were discussed with patient.  Patient was given a copy of prescription and goals.  Patient verbalized understanding.  Aedyn plans to continue to exercise by walking at home.               Exercise Goals and Review:   Exercise Goals     Row Name 05/12/22 1126             Exercise Goals   Increase Physical Activity Yes       Intervention Provide advice, education, support and counseling about physical activity/exercise needs.;Develop an individualized exercise prescription for aerobic and resistive training based on initial evaluation findings, risk stratification, comorbidities and participant's personal goals.       Expected Outcomes Short Term: Attend rehab on a regular basis to increase amount of physical activity.;Long Term: Add in home exercise to make exercise part of routine and to increase amount of physical activity.;Long Term: Exercising regularly at least 3-5 days a week.       Increase Strength and Stamina Yes       Intervention Provide advice, education, support and counseling about physical activity/exercise needs.;Develop an individualized exercise  prescription for aerobic and resistive training based on initial evaluation findings, risk stratification, comorbidities and participant's personal goals.       Expected Outcomes Short Term: Increase workloads from initial exercise prescription for resistance, speed, and METs.;Short Term: Perform resistance training exercises routinely during  rehab and add in resistance training at home;Long Term: Improve cardiorespiratory fitness, muscular endurance and strength as measured by increased METs and functional capacity (6MWT)       Able to understand and use rate of perceived exertion (RPE) scale Yes       Intervention Provide education and explanation on how to use RPE scale       Expected Outcomes Short Term: Able to use RPE daily in rehab to express subjective intensity level;Long Term:  Able to use RPE to guide intensity level when exercising independently       Able to understand and use Dyspnea scale Yes       Intervention Provide education and explanation on how to use Dyspnea scale       Expected Outcomes Short Term: Able to use Dyspnea scale daily in rehab to express subjective sense of shortness of breath during exertion;Long Term: Able to use Dyspnea scale to guide intensity level when exercising independently       Knowledge and understanding of Target Heart Rate Range (THRR) Yes       Intervention Provide education and explanation of THRR including how the numbers were predicted and where they are located for reference       Expected Outcomes Short Term: Able to state/look up THRR;Long Term: Able to use THRR to govern intensity when exercising independently;Short Term: Able to use daily as guideline for intensity in rehab       Able to check pulse independently Yes       Intervention Provide education and demonstration on how to check pulse in carotid and radial arteries.;Review the importance of being able to check your own pulse for safety during independent exercise       Expected Outcomes  Short Term: Able to explain why pulse checking is important during independent exercise;Long Term: Able to check pulse independently and accurately       Understanding of Exercise Prescription Yes       Intervention Provide education, explanation, and written materials on patient's individual exercise prescription       Expected Outcomes Short Term: Able to explain program exercise prescription;Long Term: Able to explain home exercise prescription to exercise independently                Exercise Goals Re-Evaluation :  Exercise Goals Re-Evaluation     Row Name 05/16/22 0926 05/24/22 0826 06/06/22 0940 06/06/22 0952 06/20/22 1446     Exercise Goal Re-Evaluation   Exercise Goals Review Able to understand and use rate of perceived exertion (RPE) scale;Able to understand and use Dyspnea scale;Knowledge and understanding of Target Heart Rate Range (THRR);Understanding of Exercise Prescription Increase Physical Activity;Understanding of Exercise Prescription;Increase Strength and Stamina Increase Physical Activity;Understanding of Exercise Prescription;Increase Strength and Stamina Increase Physical Activity;Understanding of Exercise Prescription;Increase Strength and Stamina Increase Physical Activity;Understanding of Exercise Prescription;Increase Strength and Stamina   Comments Reviewed RPE and dyspnea scales, THR and program prescription with pt today.  Pt voiced understanding and was given a copy of goals to take home. Kimbery is off to a good start in rehab.  She has completed her first three full days of exercise.  She is already up to 24 laps.  We will continue to monitor her progress. Reviewed home exercise with pt today.  Pt plans to walk, work on her elliptical bike, and use handweights at home for exercise. She is already exercising a couple days outside of rehab. She is going to focus on checking her  HR and extending her exercise sessions to minimum of 30 minutes. Reviewed THR, pulse, RPE,  sign and symptoms, pulse oximetery and when to call 911 or MD.  Also discussed weather considerations and indoor options.  Pt voiced understanding. Kimmarie is doing well in rehab. She increased to 28 laps on the track. She also is up to level three on the T4 machine. Alechia has also improved to use 2 lb hand weights as well. We will continue to monitor her progress in the program. Kesha continues to do well in rehab.  She is up to 30 laps on the track!  She is also continues with level 3 on the NuStep.  We will conitnue to monitor her progress.   Expected Outcomes Short: Use RPE daily to regulate intensity. Long: Follow program prescription in THR. Short: Continue to attend rehab regularly Long: Continue to follow program prescription Short: Start checking HR using pulse ox during exercise Long: Maintain consistent independent exercise at home Short: Continue increase laps on the track. Long: Continue to improve strength and stamina. Short: Try 3 lb weights  Long: conitnue to improve stamina    Row Name 06/28/22 0940 07/05/22 0936 07/18/22 1147 07/25/22 0929 07/26/22 0941     Exercise Goal Re-Evaluation   Exercise Goals Review Increase Physical Activity;Increase Strength and Stamina;Understanding of Exercise Prescription Increase Physical Activity;Increase Strength and Stamina;Understanding of Exercise Prescription Increase Physical Activity;Increase Strength and Stamina;Understanding of Exercise Prescription Increase Physical Activity;Increase Strength and Stamina;Understanding of Exercise Prescription Increase Physical Activity;Increase Strength and Stamina;Understanding of Exercise Prescription   Comments Karielle feels like she is gaining strength and since her hopitialization and getting her fluid levels under control she feels much better. She reports that she is able to do more not just in rehab but also in her daily life without as much fatigue as she gains strength. Leniyah has been experiencing some SOB  during her exercise at rehab due to fluid gain. She recently had a thoracentesis and was just told by her doctor that her fluid is slowly coming back on. She knows to let us know if she feels symptomatic while exercising. She is following up with them in 1 month. She did increase to 30 laps on the track. She would benefit from increasing to level 2 on the recumbent bike. Will continue to monitor. Homer has been doing well in rehab. She recently increased her overall average MET level to 2.82 METs. She also has been consistently reaching 30 laps on the track. She improved her workload on the treadmill as well, to a speed of 3 mph and an incline of 4.5%. We will continue to monitor her progress in the program. Tempest is doing well in rehab. She feels that the exercise has definitely helped. She notices an increase in her energy during adls, and she feels she is stronger and has more endurance. She is experiencing some shortness of breath due to fluid around her lungs. She is also exercising at home by using her personal recumbent bike and walking. Shelena completed her post 6 MWT today and improved by 12% fron her initial test. Upon graduation she plans to continue to exercise at home using her stationary bike and walking.   Expected Outcomes Short: continue to attend cardiac rehab consistently and progress with workloads as tolerated. Long: upon gradiation from program, continue with an independent exercise program. Short: Increase load on bike Long: Continue to increase overall MET level Short: push for more laps on track. Long: Continue to improve  strength and stamina. Short: Continue to exercise independently on days away from rehab. Long: Continue to increase strength and stamina. Continue with exercise program independently upon graudation from cardiac rehab.    [place] Name 08/01/22 1057             Exercise Goal Re-Evaluation   Exercise Goals Review Increase Physical Activity;Increase Strength and  Stamina;Understanding of Exercise Prescription       Comments Kimber continues to do well in rehab. She recently completed her post 6MWT and improved by 12%! She also had an average overall MET level of 2.32 METs. She has also continued to tolerate 2 lb hand weights for resistance training. We will continue to monitor her progress until she graduates from the program.       Expected Outcomes Short: Graduate. Long: Continue to exercise independently.                Discharge Exercise Prescription (Final Exercise Prescription Changes):  Exercise Prescription Changes - 08/01/22 1000       Response to Exercise   Blood Pressure (Admit) 122/84    Blood Pressure (Exit) 132/62    Heart Rate (Admit) 62 bpm    Heart Rate (Exercise) 90 bpm    Heart Rate (Exit) 87 bpm    Rating of Perceived Exertion (Exercise) 13    Symptoms none    Duration Continue with 30 min of aerobic exercise without signs/symptoms of physical distress.    Intensity THRR unchanged      Progression   Progression Continue to progress workloads to maintain intensity without signs/symptoms of physical distress.    Average METs 2.32      Resistance Training   Training Prescription Yes    Weight 2 lb    Reps 10-15      Interval Training   Interval Training No      Recumbant Bike   Level 3    Minutes 15    METs 2.79      NuStep   Level 4    Minutes 15    METs 2.3      Biostep-RELP   Level 2    Minutes 15    METs 2      Track   Laps 22    Minutes 15    METs 2.2      Home Exercise Plan   Plans to continue exercise at Home (comment)   walking, recumbent bike   Frequency Add 3 additional days to program exercise sessions.    Initial Home Exercises Provided 06/08/22      Oxygen   Maintain Oxygen Saturation 88% or higher             Nutrition:  Target Goals: Understanding of nutrition guidelines, daily intake of sodium '1500mg'$ , cholesterol '200mg'$ , calories 30% from fat and 7% or less from saturated  fats, daily to have 5 or more servings of fruits and vegetables.  Education: All About Nutrition: -Group instruction provided by verbal, written material, interactive activities, discussions, models, and posters to present general guidelines for heart healthy nutrition including fat, fiber, MyPlate, the role of sodium in heart healthy nutrition, utilization of the nutrition label, and utilization of this knowledge for meal planning. Follow up email sent as well. Written material given at graduation. Flowsheet Row Cardiac Rehab from 08/11/2022 in [facility] Card[facility] Rehab  Education need identified 05/12/22  Date 07/07/22  Educator [facility]  Instruction Review Code 1- [employer]  Biometrics:  Pre Biometrics - 05/12/22 1127       Pre Biometrics   Height 5' 5.1" (1.654 m)    Weight 118 lb 3.2 oz (53.6 kg)    BMI (Calculated) 19.6    Single Leg Stand 3.3 seconds             Post Biometrics - 07/26/22 0936        Post  Biometrics   Height 5' 5.1" (1.654 m)    Weight 113 lb 11.2 oz (51.6 kg)    BMI (Calculated) 18.85    Single Leg Stand 4.78 seconds             Nutrition Therapy Plan and Nutrition Goals:  Nutrition Therapy & Goals - 07/25/22 0933       Nutrition Therapy   RD appointment deferred Yes   Pt would not like to meet with RD at this time. Will continue to follow up.   Diet Heart Healthy      Personal Nutrition Goals   Nutrition Goal Pt would not like to meet with RD at this time. Will continue to follow up.      Intervention Plan   Intervention Prescribe, educate and counsel regarding individualized specific dietary modifications aiming towards targeted core components such as weight, hypertension, lipid management, diabetes, heart failure and other comorbidities.    Expected Outcomes Short Term Goal: Understand basic principles of dietary content, such as calories, fat, sodium, cholesterol and nutrients.;Long Term Goal: Adherence to  prescribed nutrition plan.;Short Term Goal: A plan has been developed with personal nutrition goals set during dietitian appointment.             Nutrition Assessments:  MEDIFICTS Score Key: ?70 Need to make dietary changes  40-70 Heart Healthy Diet ? 40 Therapeutic Level Cholesterol Diet  Flowsheet Row Cardiac Rehab from 08/01/2022 in [facility] Cardiac and Pulmonary Rehab  Picture Your Plate Total Score on Admission 76  Picture Your Plate Total Score on Discharge 81      Picture Your Plate Scores: <28 Unhealthy dietary pattern with much room for improvement. 41-50 Dietary pattern unlikely to meet recommendations for good health and room for improvement. 51-60 More healthful dietary pattern, with some room for improvement.  >60 Healthy dietary pattern, although there may be some specific behaviors that could be improved.    Nutrition Goals Re-Evaluation:  Nutrition Goals Re-Evaluation     Row Name 06/28/22 0948 07/26/22 0944           Goals   Nutrition Goal -- Pt would not like to meet with RD at this time. Will continue to follow up.      Comment Patient still delines meeting with program dietician. She met with a dietician several years ago and feels like she is following that plan well and is making heart healthy choices with her eating habits. Patient feels like she is doing well meeting her nutritional needs and eating a heart healthy diet.      Expected Outcome Short: continue to follow up with patient to make sure she has not further quetions or wants to meet with dietician. Long: Continue to eat a heart healthy diet. Patient will continue heart healthy diet to help control cardiac risk factors upon graduation from cardiac rehab.               Nutrition Goals Discharge (Final Nutrition Goals Re-Evaluation):  Nutrition Goals Re-Evaluation - 07/26/22 0944       Goals   Nutrition  Goal Pt would not like to meet with RD at this time. Will continue to follow up.     Comment Patient feels like she is doing well meeting her nutritional needs and eating a heart healthy diet.    Expected Outcome Patient will continue heart healthy diet to help control cardiac risk factors upon graduation from cardiac rehab.             Psychosocial: Target Goals: Acknowledge presence or absence of significant depression and/or stress, maximize coping skills, provide positive support system. Participant is able to verbalize types and ability to use techniques and skills needed for reducing stress and depression.   Education: Stress, Anxiety, and Depression - Group verbal and visual presentation to define topics covered.  Reviews how body is impacted by stress, anxiety, and depression.  Also discusses healthy ways to reduce stress and to treat/manage anxiety and depression.  Written material given at graduation. Flowsheet Row Cardiac Rehab from 08/11/2022 in [facility] Cardiac and Pulmonary Rehab  Date 06/02/22  Educator [facility]  Instruction Review Code 1- [employer] Understanding       Education: Sleep Hygiene -Provides group verbal and written instruction about how sleep can affect your health.  Define sleep hygiene, discuss sleep cycles and impact of sleep habits. Review good sleep hygiene tips.    Initial Review & Psychosocial Screening:  Initial Psych Review & Screening - 05/04/22 0943       Initial Review   Current issues with None Identified      Family Dynamics   Good Support System? Yes   friends and neighbors   Comments lost husband 08/20/2017 from pancreatic cancer       Barriers   Psychosocial barriers to participate in program There are no identifiable barriers or psychosocial needs.      Screening Interventions   Interventions Encouraged to exercise;To provide support and resources with identified psychosocial needs;Provide feedback about the scores to participant    Expected Outcomes Short Term goal: Utilizing psychosocial counselor, staff and physician to  assist with identification of specific Stressors or current issues interfering with healing process. Setting desired goal for each stressor or current issue identified.;Long Term Goal: Stressors or current issues are controlled or eliminated.;Short Term goal: Identification and review with participant of any Quality of Life or Depression concerns found by scoring the questionnaire.;Long Term goal: The participant improves quality of Life and PHQ9 Scores as seen by post scores and/or verbalization of changes             Quality of Life Scores:   Quality of Life - 08/01/22 0955       Quality of Life   Select Quality of Life      Quality of Life Scores   Health/Function Pre 27.2 %    Health/Function Post 24.92 %    Health/Function % Change -8.38 %    Socioeconomic Pre 25.19 %    Socioeconomic Post 30 %    Socioeconomic % Change  19.09 %    Psych/Spiritual Pre 30 %    Psych/Spiritual Post 30 %    Psych/Spiritual % Change 0 %    Family Pre 15 %    Family Post 15 %    Family % Change 0 %    GLOBAL Pre 25.26 %            Scores of 19 and below usually indicate a poorer quality of life in these areas.  A difference of  2-3 points is a clinically  meaningful difference.  A difference of 2-3 points in the total score of the Quality of Life Index has been associated with significant improvement in overall quality of life, self-image, physical symptoms, and general health in studies assessing change in quality of life.  PHQ-9: Review Flowsheet  More data exists      08/01/2022 05/12/2022 03/22/2021 12/19/2019 02/09/2018  Depression screen PHQ 2/9  Decreased Interest 0 0 0 0 0  Down, Depressed, Hopeless 0 0 0 0 0  PHQ - 2 Score 0 0 0 0 0  Altered sleeping 0 1 - - -  Tired, decreased energy 1 3 - - -  Change in appetite 0 3 - - -  Feeling bad or failure about yourself  0 0 - - -  Trouble concentrating 0 0 - - -  Moving slowly or fidgety/restless 0 1 - - -  Suicidal thoughts 0 0 - - -   PHQ-9 Score 1 8 - - -  Difficult doing work/chores Not difficult at all Somewhat difficult - - -   Interpretation of Total Score  Total Score Depression Severity:  1-4 = Minimal depression, 5-9 = Mild depression, 10-14 = Moderate depression, 15-19 = Moderately severe depression, 20-27 = Severe depression   Psychosocial Evaluation and Intervention:  Psychosocial Evaluation - 05/04/22 0954       Psychosocial Evaluation & Interventions   Interventions Encouraged to exercise with the program and follow exercise prescription    Comments Ervin has no barriers to attending the program. [facility] is ready to start the program and work on her strength and stamina and learn about caring for herself. She has completed CR in the past. She is a widow and has friends and neighbors that check on her daily. 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    Expected Outcomes STG Daijha will attend all scheduled sessions, she will gain back her strength and stamina to get back to her live and hobby of traveling. LTG Rosilyn will continue to progress after discharge    Continue Psychosocial Services  Follow up required by staff             Psychosocial Re-Evaluation:  Psychosocial Re-Evaluation     [place] Name 06/06/22 [phone] 06/28/22 0947 07/25/22 0933 07/26/22 0943       Psychosocial Re-Evaluation   Current issues with None Identified None Identified -- None Identified    Comments Dominque is doing well mentally. She has great support from family and friends. Not  on any medications for depression or anxiety. She is recovering from fluid gain and a thoracentesis in hospital and feels so much better with resolved SOB. She is going to [facility] in a couple weeks and is excited to spend time there with friends. She plans to bring her weights and exercise there. Emaly reports no new concerns with stress, sleep, or mental health. She continues to have a good support system. Tanajah  reports no currents stressors at this time. She also states no current issues with anxiety or mental health. She is sleeping well, but does wake up at night from time to time. Loana also says that exercise has been a good stress reliever for her. Patient reports no new stress, mental health, or sleep concerns.    Expected Outcomes Short: Have fun on vacation Long: Continue to maintain positive attitude Short: Continue to exercise at rehab for physical and mental health benefits. Long: Indpendently maintain good mental health  habits. Short: Continue to exercise for stress relief. Long: Continue to maintain positive outlook. Patient will continue positive mental health habits upon graduation from cardiac rehab.    Interventions Encouraged to attend Cardiac Rehabilitation for the exercise Encouraged to attend Cardiac Rehabilitation for the exercise Encouraged to attend Cardiac Rehabilitation for the exercise Encouraged to attend Cardiac Rehabilitation for the exercise    Continue Psychosocial Services  Follow up required by staff Follow up required by staff Follow up required by staff No Follow up required  patient is graduating from program soon             Psychosocial Discharge (Final Psychosocial Re-Evaluation):  Psychosocial Re-Evaluation - 07/26/22 0943       Psychosocial Re-Evaluation   Current issues with None Identified    Comments Patient reports no new stress, mental health, or sleep concerns.    Expected Outcomes Patient will continue positive mental health habits upon graduation from cardiac rehab.    Interventions Encouraged to attend Cardiac Rehabilitation for the exercise    Continue Psychosocial Services  No Follow up required   patient is graduating from program soon            Vocational Rehabilitation: Provide vocational rehab assistance to qualifying candidates.   Vocational Rehab Evaluation & Intervention:  Vocational Rehab - 08/01/22 0955       Discharge  Vocational Rehab   Discharge Vocational Rehabilitation retired             Education: Education Goals: Education classes will be provided on a variety of topics geared toward better understanding of heart health and risk factor modification. Participant will state understanding/return demonstration of topics presented as noted by education test scores.  Learning Barriers/Preferences:   General Cardiac Education Topics:  AED/CPR: - Group verbal and written instruction with the use of models to demonstrate the basic use of the AED with the basic ABC's of resuscitation.   Anatomy and Cardiac Procedures: - Group verbal and visual presentation and models provide information about basic cardiac anatomy and function. Reviews the testing methods done to diagnose heart disease and the outcomes of the test results. Describes the treatment choices: Medical Management, Angioplasty, or Coronary Bypass Surgery for treating various heart conditions including Myocardial Infarction, Angina, Valve Disease, and Cardiac Arrhythmias.  Written material given at graduation. Flowsheet Row Cardiac Rehab from 08/11/2022 in [facility] Cardiac and Pulmonary Rehab  Education need identified 05/12/22       Medication Safety: - Group verbal and visual instruction to review commonly prescribed medications for heart and lung disease. Reviews the medication, class of the drug, and side effects. Includes the steps to properly store meds and maintain the prescription regimen.  Written material given at graduation. Flowsheet Row Cardiac Rehab from 08/11/2022 in [facility] Cardiac and Pulmonary Rehab  Date 07/14/22  Educator SB  Instruction Review Code 1- Verbalizes Understanding       Intimacy: - Group verbal instruction through game format to discuss how heart and lung disease can affect sexual intimacy. Written material given at graduation.. Flowsheet Row Cardiac Rehab from 08/11/2022 in [facility] Cardiac and Pulmonary Rehab   Date 06/16/22  Educator [facility]  Instruction Review Code 1- Verbalizes Understanding       Know Your Numbers and Heart Failure: - Group verbal and visual instruction to discuss disease risk factors for cardiac and pulmonary disease and treatment options.  Reviews associated critical values for Overweight/Obesity, Hypertension, Cholesterol, and Diabetes.  Discusses basics of heart failure: signs/symptoms and treatments.  Introduces Heart Failure Zone chart for action plan for heart failure.  Written material given at graduation. Flowsheet Row Cardiac Rehab from 08/11/2022 in [facility] Cardiac and Pulmonary Rehab  Date 05/19/22  Educator SB  Instruction Review Code 1- Verbalizes Understanding       Infection Prevention: - Provides verbal and written material to individual with discussion of infection control including proper hand washing and proper equipment cleaning during exercise session. Flowsheet Row Cardiac Rehab from 08/11/2022 in [facility] Cardiac and Pulmonary Rehab  Education need identified 05/12/22       Falls Prevention: - Provides verbal and written material to individual with discussion of falls prevention and safety. Flowsheet Row Cardiac Rehab from 08/11/2022 in [facility] Cardiac and Pulmonary Rehab  Date 05/04/22  Educator SB  Instruction Review Code 1- Verbalizes Understanding       Other: -Provides group and verbal instruction on various topics (see comments)   Knowledge Questionnaire Score:  Knowledge Questionnaire Score - 08/01/22 0955       Knowledge Questionnaire Score   Post Score 26/26             Core Components/Risk Factors/Patient Goals at Admission:  Personal Goals and Risk Factors at Admission - 05/12/22 1131       Core Components/Risk Factors/Patient Goals on Admission    Weight Management Yes;Weight Gain;Weight Maintenance    Intervention Weight Management: Develop a combined nutrition and exercise program designed to reach desired caloric intake,  while maintaining appropriate intake of nutrient and fiber, sodium and fats, and appropriate energy expenditure required for the weight goal.;Weight Management: Provide education and appropriate resources to help participant work on and attain dietary goals.    Admit Weight 118 lb 3.2 oz (53.6 kg)    Goal Weight: Short Term 120 lb (54.4 kg)    Goal Weight: Long Term 120 lb (54.4 kg)    Expected Outcomes Short Term: Continue to assess and modify interventions until short term weight is achieved;Long Term: Adherence to nutrition and physical activity/exercise program aimed toward attainment of established weight goal;Weight Gain: Understanding of general recommendations for a high calorie, high protein meal plan that promotes weight gain by distributing calorie intake throughout the day with the consumption for 4-5 meals, snacks, and/or supplements;Weight Maintenance: Understanding of the daily nutrition guidelines, which includes 25-35% calories from fat, 7% or less cal from saturated fats, less than $RemoveB'200mg'[id number]$  cholesterol, less than 1.5gm of sodium, & 5 or more servings of fruits and vegetables daily    Diabetes Yes    Intervention Provide education about signs/symptoms and action to take for hypo/hyperglycemia.;Provide education about proper nutrition, including hydration, and aerobic/resistive exercise prescription along with prescribed medications to achieve blood glucose in normal ranges: Fasting glucose 65-99 mg/dL    Expected Outcomes Short Term: Participant verbalizes understanding of the signs/symptoms and immediate care of hyper/hypoglycemia, proper foot care and importance of medication, aerobic/resistive exercise and nutrition plan for blood glucose control.;Long Term: Attainment of HbA1C < 7%.    Lipids Yes    Intervention Provide education and support for participant on nutrition & aerobic/resistive exercise along with prescribed medications to achieve LDL '70mg'$ , HDL >$Remo'40mg'olxYa$ .    Expected Outcomes  Short Term: Participant states understanding of desired cholesterol values and is compliant with medications prescribed. Participant is following exercise prescription and nutrition guidelines.;Long Term: Cholesterol controlled with medications as prescribed, with individualized exercise RX and with personalized nutrition plan. Value goals: LDL < $Rem'70mg'lefN$ , HDL > 40 mg.  Education:Diabetes - Individual verbal and written instruction to review signs/symptoms of diabetes, desired ranges of glucose level fasting, after meals and with exercise. Acknowledge that pre and post exercise glucose checks will be done for 3 sessions at entry of program. [place] from 08/11/2022 in [facility] Cardiac and Pulmonary Rehab  Date 05/12/22       Core Components/Risk Factors/Patient Goals Review:   Goals and Risk Factor Review     Row Name 06/06/22 0949 06/28/22 0942 07/25/22 0936 07/26/22 0946       Core Components/Risk Factors/Patient Goals Review   Personal Goals Review Weight Management/Obesity;Lipids;Diabetes Weight Management/Obesity;Lipids;Diabetes Weight Management/Obesity;Lipids;Diabetes Weight Management/Obesity;Lipids;Diabetes    Review Cierra is watching her weight at home every day as she was just in the hospital a couple weeks ago for increased fluid and had to get a thoracentesis. She was feeling SOB but has resolved. The doctors gave her torsemide for medication management and was put on fluid restrictions. She is aware to notify her doctor of any significant weight change but was also told she can drop to double digits if it happens but trying to maintain. She has lost weight , weighing around 108 lb. Her normal weight is about 115 lb. She is checking blood sugars at home, which are ranging around 115 fasting which she states is her normal. She is compliant checking them and takes all her medications as  directed. She is following up with PCP later today on blood work,  including her cholesterol Isabeau has been doing well since her hopsital discharge and medicaiton change with managing fluid levels. She reports feeling much better and continues to monitor weight and take all medications as prescribed. She has an x-ray scheduled for this week as a follow up to make sure all her fluid has cleared.  She reports checking her blood sugars at home and that they are in acceptable ranges. Jahyra feels that she would still like to gain some weight, but is taking all her medication as prescribed. She is still dealing with some fluid around her lungs, but states she feels much better when she gets the fluid drained. Fidencia has also been checking her blood suger levels at home and states that they have been within the normal ranges. Patient reports that she continues to monitor blood sugars at home and continues to try to eat healthy calorie rich foods to continue to work on gaining some weight.    Expected Outcomes Short: Continue to watch weight at home closely, notify doctor of any abnormal changes Long: Continue to manage lifestyle risk factors Continue to monitor weight at home and have x-ray and follow up with doctor. Long: monitor and control risk factors with help of healthcare team and lifestyle changes. Short: Continue to monitor weight and continue healthy habits for weight gain. Long: Continue to monitor and control risk factors. Upon graduation from cardiac rehab patient will continue to monitor weight and blood sugars to help control cardiac risk factors.             Core Components/Risk Factors/Patient Goals at Discharge (Final Review):   Goals and Risk Factor Review - 07/26/22 0946       Core Components/Risk Factors/Patient Goals Review   Personal Goals Review Weight Management/Obesity;Lipids;Diabetes    Review Patient reports that she continues to monitor blood sugars at home and continues to try to eat healthy calorie rich foods to continue to work on gaining some  weight.    Expected Outcomes Upon graduation  from cardiac rehab patient will continue to monitor weight and blood sugars to help control cardiac risk factors.             ITP Comments:  ITP Comments     Row Name 05/04/22 0958 05/12/22 1104 05/16/22 0925 05/25/22 0934 06/22/22 0747   ITP Comments Virtual orientation call completed today. shehas an appointment on Date: 05/12/2022  for EP eval and gym Orientation.  Documentation of diagnosis can be found in [facility] Date: 04/07/2022 . Completed 6MWT and gym orientation. Initial ITP created and sent for review to Dr. Ramonita Lab, covering for Dr. Sabra Heck Medical Director. First full day of exercise!  Patient was oriented to gym and equipment including functions, settings, policies, and procedures.  Patient's individual exercise prescription and treatment plan were reviewed.  All starting workloads were established based on the results of the 6 minute walk test done at initial orientation visit.  The plan for exercise progression was also introduced and progression will be customized based on patient's performance and goals. 30 Day review completed. Medical Director ITP review done, changes made as directed, and signed approval by Medical Director.    New to program 30 Day review completed. Medical Director ITP review done, changes made as directed, and signed approval by Medical Director.    Briny [place] Name 07/20/22 0647 08/16/22 1018         ITP Comments 30 Day review completed. Medical Director ITP review done, changes made as directed, and signed approval by Medical Director. Lennan graduated today from  rehab with 36 sessions completed.  Details of the patient's exercise prescription and what She needs to do in order to continue the prescription and progress were discussed with patient.  Patient was given a copy of prescription and goals.  Patient verbalized understanding.  Betti plans to continue to exercise by walking at home.               Comments:  Discharge ITP

## 2022-08-18 ENCOUNTER — Ambulatory Visit (HOSPITAL_COMMUNITY): Payer: Medicare PPO

## 2022-08-18 ENCOUNTER — Encounter (HOSPITAL_COMMUNITY): Payer: Self-pay

## 2022-08-30 ENCOUNTER — Ambulatory Visit: Payer: Medicare PPO | Admitting: Dermatology

## 2022-08-30 DIAGNOSIS — H401132 Primary open-angle glaucoma, bilateral, moderate stage: Secondary | ICD-10-CM | POA: Diagnosis not present

## 2022-08-30 DIAGNOSIS — Z961 Presence of intraocular lens: Secondary | ICD-10-CM | POA: Diagnosis not present

## 2022-09-01 ENCOUNTER — Encounter: Payer: Self-pay | Admitting: Family Medicine

## 2022-09-08 ENCOUNTER — Other Ambulatory Visit: Payer: Medicare PPO

## 2022-09-08 ENCOUNTER — Ambulatory Visit: Payer: Medicare PPO

## 2022-09-08 DIAGNOSIS — E871 Hypo-osmolality and hyponatremia: Secondary | ICD-10-CM | POA: Diagnosis not present

## 2022-09-08 DIAGNOSIS — I10 Essential (primary) hypertension: Secondary | ICD-10-CM | POA: Diagnosis not present

## 2022-09-08 DIAGNOSIS — Z23 Encounter for immunization: Secondary | ICD-10-CM

## 2022-09-08 DIAGNOSIS — R7303 Prediabetes: Secondary | ICD-10-CM | POA: Diagnosis not present

## 2022-09-08 DIAGNOSIS — E785 Hyperlipidemia, unspecified: Secondary | ICD-10-CM

## 2022-09-09 LAB — COMPREHENSIVE METABOLIC PANEL
AG Ratio: 1.7 (calc) (ref 1.0–2.5)
ALT: 18 U/L (ref 6–29)
AST: 18 U/L (ref 10–35)
Albumin: 4.3 g/dL (ref 3.6–5.1)
Alkaline phosphatase (APISO): 97 U/L (ref 37–153)
BUN/Creatinine Ratio: 18 (calc) (ref 6–22)
BUN: 28 mg/dL — ABNORMAL HIGH (ref 7–25)
CO2: 33 mmol/L — ABNORMAL HIGH (ref 20–32)
Calcium: 9.9 mg/dL (ref 8.6–10.4)
Chloride: 98 mmol/L (ref 98–110)
Creat: 1.54 mg/dL — ABNORMAL HIGH (ref 0.60–1.00)
Globulin: 2.6 g/dL (calc) (ref 1.9–3.7)
Glucose, Bld: 124 mg/dL — ABNORMAL HIGH (ref 65–99)
Potassium: 5.1 mmol/L (ref 3.5–5.3)
Sodium: 140 mmol/L (ref 135–146)
Total Bilirubin: 0.7 mg/dL (ref 0.2–1.2)
Total Protein: 6.9 g/dL (ref 6.1–8.1)

## 2022-09-09 LAB — CBC WITH DIFFERENTIAL/PLATELET
Absolute Monocytes: 1067 cells/uL — ABNORMAL HIGH (ref 200–950)
Basophils Absolute: 71 cells/uL (ref 0–200)
Basophils Relative: 0.9 %
Eosinophils Absolute: 221 cells/uL (ref 15–500)
Eosinophils Relative: 2.8 %
HCT: 35.8 % (ref 35.0–45.0)
Hemoglobin: 11.6 g/dL — ABNORMAL LOW (ref 11.7–15.5)
Lymphs Abs: 1035 cells/uL (ref 850–3900)
MCH: 26.9 pg — ABNORMAL LOW (ref 27.0–33.0)
MCHC: 32.4 g/dL (ref 32.0–36.0)
MCV: 82.9 fL (ref 80.0–100.0)
MPV: 8.8 fL (ref 7.5–12.5)
Monocytes Relative: 13.5 %
Neutro Abs: 5506 cells/uL (ref 1500–7800)
Neutrophils Relative %: 69.7 %
Platelets: 451 10*3/uL — ABNORMAL HIGH (ref 140–400)
RBC: 4.32 10*6/uL (ref 3.80–5.10)
RDW: 16.3 % — ABNORMAL HIGH (ref 11.0–15.0)
Total Lymphocyte: 13.1 %
WBC: 7.9 10*3/uL (ref 3.8–10.8)

## 2022-09-13 ENCOUNTER — Other Ambulatory Visit: Payer: Self-pay | Admitting: Surgery

## 2022-09-13 DIAGNOSIS — Z952 Presence of prosthetic heart valve: Secondary | ICD-10-CM

## 2022-09-13 DIAGNOSIS — J90 Pleural effusion, not elsewhere classified: Secondary | ICD-10-CM

## 2022-09-14 ENCOUNTER — Ambulatory Visit: Payer: Medicare PPO | Admitting: Surgery

## 2022-09-14 ENCOUNTER — Ambulatory Visit
Admission: RE | Admit: 2022-09-14 | Discharge: 2022-09-14 | Disposition: A | Discharge status: Home / Self Care | Payer: Medicare PPO | Source: Ambulatory Visit | Attending: Surgery | Admitting: Surgery

## 2022-09-14 DIAGNOSIS — I351 Nonrheumatic aortic (valve) insufficiency: Secondary | ICD-10-CM

## 2022-09-14 DIAGNOSIS — Z9889 Other specified postprocedural states: Secondary | ICD-10-CM | POA: Diagnosis not present

## 2022-09-14 DIAGNOSIS — J90 Pleural effusion, not elsewhere classified: Secondary | ICD-10-CM

## 2022-09-14 DIAGNOSIS — Z952 Presence of prosthetic heart valve: Secondary | ICD-10-CM | POA: Diagnosis not present

## 2022-09-14 DIAGNOSIS — J449 Chronic obstructive pulmonary disease, unspecified: Secondary | ICD-10-CM | POA: Diagnosis not present

## 2022-09-14 NOTE — Progress Notes (Signed)
    HPI: ***  Current Outpatient Medications  Medication Sig Dispense Refill   acetaminophen (TYLENOL) 325 MG tablet Take 2 tablets (650 mg total) by mouth 2 (two) times daily as needed for moderate pain or headache.     alendronate (FOSAMAX) 70 MG tablet Take 1 tablet (70 mg total) by mouth once a week. (Patient taking differently: Take 70 mg by mouth once a week. Sunday) 12 tablet 3   atorvastatin (LIPITOR) 20 MG tablet Take 1 tablet (20 mg total) by mouth daily. 30 tablet 3   Blood Glucose Monitoring Suppl (ACCU-CHEK GUIDE) w/Device KIT 1 Units by Does not apply route daily. 1 kit 0   Calcium Carbonate (CALCIUM 600 PO) Take 600 mg by mouth 2 (two) times daily. Lunch and Dinner     chlorthalidone (HYGROTON) 25 MG tablet TAKE 1 TABLET BY MOUTH ONCE A DAY 90 tablet 1   CREON 36000-114000 units CPEP capsule Take 36,000 Units by mouth 3 (three) times daily before meals.     esomeprazole (NEXIUM) 40 MG capsule Take 40 mg by mouth daily before breakfast.   12   fluticasone (FLONASE) 50 MCG/ACT nasal spray Place 2 sprays into both nostrils daily. (Patient taking differently: Place 2 sprays into both nostrils daily as needed for rhinitis.) 16 g 6   glucose blood (ACCU-CHEK GUIDE) test strip USE TO TEST BLOOD SUGARS EVERY MORNING 100 each 3   Lancets (ACCU-CHEK SOFT TOUCH) lancets Check BS QAM 100 each 3   Lancets Misc. (ACCU-CHEK SOFTCLIX LANCET DEV) KIT Check BS QAM 1 kit 0   levothyroxine (SYNTHROID) 50 MCG tablet TAKE 1 TABLET BY MOUTH ONCE A DAY (Patient taking differently: Take 50 mcg by mouth daily before breakfast.) 90 tablet 3   metoprolol tartrate (LOPRESSOR) 100 MG tablet Take 1 tablet (100 mg total) by mouth 2 (two) times daily. 180 tablet 3   Multiple Vitamin (MULTIVITAMIN WITH MINERALS) TABS tablet Take 1 tablet by mouth daily with lunch. Centrum Silver.     mupirocin ointment (BACTROBAN) 2 % Apply to healing wounds QD-BID until healed. 22 g 0   potassium chloride SA (KLOR-CON M) 20 MEQ  tablet TAKE 1 TABLET BY MOUTH ONCE A DAY 30 tablet 10   traMADol (ULTRAM) 50 MG tablet Take 1 tablet (50 mg total) by mouth every 6 (six) hours as needed for moderate pain. 28 tablet 0   warfarin (COUMADIN) 5 MG tablet TAKE 1 TO 2 TABLETS BY MOUTH DAILY AS DIRECTED BY COUMADIN CLINIC 120 tablet 0   Current Facility-Administered Medications  Medication Dose Route Frequency Provider Last Rate Last Admin   torsemide (DEMADEX) tablet 60 mg  60 mg Oral Daily [name], MD         Physical Exam: ***  Diagnostic Tests: ***  Impression: ***  Plan: ***   [name], MD [facility] [phone]

## 2022-09-16 ENCOUNTER — Ambulatory Visit: Payer: Medicare PPO | Attending: Cardiovascular Disease

## 2022-09-16 DIAGNOSIS — Z952 Presence of prosthetic heart valve: Secondary | ICD-10-CM

## 2022-09-16 DIAGNOSIS — Z7901 Long term (current) use of anticoagulants: Secondary | ICD-10-CM

## 2022-09-16 LAB — POCT INR: INR: 2.2 (ref 2.0–3.0)

## 2022-09-16 NOTE — Patient Instructions (Signed)
Description   Continue taking 1 tablet daily EXCEPT 2 tablets on Mondays, Wednesdays, Saturdays.  Repeat INR 5 weeks. Coumadin Clinic# [phone]

## 2022-09-27 ENCOUNTER — Ambulatory Visit (INDEPENDENT_AMBULATORY_CARE_PROVIDER_SITE_OTHER): Payer: Medicare PPO

## 2022-09-27 DIAGNOSIS — I442 Atrioventricular block, complete: Secondary | ICD-10-CM

## 2022-09-27 LAB — CUP PACEART REMOTE DEVICE CHECK
Battery Remaining Longevity: 56 mo
Battery Voltage: 2.95 V
Brady Statistic AP VP Percent: 77.95 %
Brady Statistic AP VS Percent: 0 %
Brady Statistic AS VP Percent: 22.05 %
Brady Statistic AS VS Percent: 0 %
Brady Statistic RA Percent Paced: 77.94 %
Brady Statistic RV Percent Paced: 100 %
Date Time Interrogation Session: 20231017073838
Implantable Lead Implant Date: 20181009
Implantable Lead Implant Date: 20181009
Implantable Lead Location: 753859
Implantable Lead Location: 753860
Implantable Lead Model: 5076
Implantable Lead Model: 5076
Implantable Pulse Generator Implant Date: 20181009
Lead Channel Impedance Value: 247 Ohm
Lead Channel Impedance Value: 285 Ohm
Lead Channel Impedance Value: 380 Ohm
Lead Channel Impedance Value: 437 Ohm
Lead Channel Pacing Threshold Amplitude: 1 V
Lead Channel Pacing Threshold Amplitude: 1.25 V
Lead Channel Pacing Threshold Pulse Width: 0.4 ms
Lead Channel Pacing Threshold Pulse Width: 0.4 ms
Lead Channel Sensing Intrinsic Amplitude: 2.5 mV
Lead Channel Sensing Intrinsic Amplitude: 2.5 mV
Lead Channel Sensing Intrinsic Amplitude: 5.25 mV
Lead Channel Setting Pacing Amplitude: 2.5 V
Lead Channel Setting Pacing Amplitude: 2.5 V
Lead Channel Setting Pacing Pulse Width: 0.4 ms
Lead Channel Setting Sensing Sensitivity: 2 mV

## 2022-09-28 ENCOUNTER — Other Ambulatory Visit: Payer: Self-pay | Admitting: Cardiology

## 2022-09-28 DIAGNOSIS — Z952 Presence of prosthetic heart valve: Secondary | ICD-10-CM

## 2022-09-28 NOTE — Telephone Encounter (Signed)
Prescription refill request received for warfarin [place]: 07/21/22 ([place])  Next INR check: 10/21/22 Warfarin tablet strength: '5mg'$   Appropriate dose and refill sent to requested pharmacy.

## 2022-10-05 ENCOUNTER — Other Ambulatory Visit: Payer: Self-pay | Admitting: Family Medicine

## 2022-10-07 ENCOUNTER — Telehealth: Payer: Self-pay | Admitting: Cardiology

## 2022-10-07 MED ORDER — TORSEMIDE 10 MG PO TABS: 20.0000 mg | ORAL_TABLET | Freq: Every day | ORAL | 0 refills | Status: DC

## 2022-10-07 MED ORDER — ATORVASTATIN CALCIUM 20 MG PO TABS: 20.0000 mg | ORAL_TABLET | Freq: Every day | ORAL | 6 refills | Status: DC

## 2022-10-07 NOTE — Telephone Encounter (Signed)
Pt c/o medication issue:  1. Name of Medication: torsemide (DEMADEX) tablet 60 mg  2. How are you currently taking this medication (dosage and times per day)? Not sure   3. Are you having a reaction (difficulty breathing--STAT)?   4. What is your medication issue? Pt calling in because she needs a refill on this medication, however she said it's supposed to be '10mg'$  and not '60mg'$ . She also states that Dr. Cyndia Bent has released her from his care

## 2022-10-07 NOTE — Telephone Encounter (Signed)
*  STAT* If patient is at the pharmacy, call can be transferred to refill team.   1. Which medications need to be refilled? (please list name of each medication and dose if known) atorvastatin (LIPITOR) 20 MG tablet  2. Which pharmacy/location (including street and city if local pharmacy) is medication to be sent to? [place], [place]  3. Do they need a 30 day or 90 day supply? [place]

## 2022-10-07 NOTE — Telephone Encounter (Signed)
Returned call to patient who states that her Torsemide dose was decreased at recent OV with Dr. Cyndia Bent.   Per that OV note patient supposed to decrease down to '30mg'$  torsemide daily   " I told her to decrease the torsemide to 30 mg daily.  If she continues to do well with no increased pleural effusion or peripheral edema then she may be able to have her diuretic dose decreased further.  She has an appointment with Dr. Percival Spanish in early November."   Patient states that she has only been taking '20mg'$  of Torsemide daily and states that she feels well and denies any shortness of breath/LE swelling. Patient states that she feels the '20mg'$  of Torsemide daily is enough and states that she is out and needs a refill  Spoke with Dr. Percival Spanish who advised that it is okay to refill Torsemide at '20mg'$  daily- refill has been sent to patient preferred pharmacy. Advised patient to keep [place] on 11/10 with Dr. Percival Spanish   Patient aware and verbalized understanding

## 2022-10-11 NOTE — Progress Notes (Signed)
Remote pacemaker transmission.   

## 2022-10-19 NOTE — Progress Notes (Signed)
Cardiology Office Note   Date:  10/21/2022   ID:  Patricia [name], DOB 05/22/1951, MRN [id number]  PCP:  Susy Frizzle, MD  Cardiologist:   Minus Breeding, MD   Chief Complaint  Patient presents with   Valvular heart disease     History of Present Illness: Patricia [name] is a 71 y.o. 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  She has had atrial flutter ablated.   She had moderate to severe AI.   TEE confirmed On New Year's Day and she awoke and noticed that she had lower extremity swelling.  She is actually since that time had started sleeping on 2 or 3 pillows.  She did not have any chest discomfort, neck or arm discomfort.  She has not had any cough fevers or chills.  She has not noticed any palpitations, presyncope or syncope.  She was treated with Lasix.  This did improve her lower extremity swelling.  She did have a mildly elevated BNP.  Her kidney function was normal.  Echo demonstrated aortic valve regurgitation is moderate to severe.  The EF was 55 to 60%.  There was a Medtronic valve in the mitral position and he appeared to have normal function.  There is mild to moderate mitral regurgitation.  There is a question of flow reversal in the descending aorta.  She had TEE which also suggested severe AI with flow reversal.  She is status post AVR.  She had atrial fibrillation postprocedure.  She had to come back to the hospital for pleural effusions and had thoracentesis.    At the last visit I stopped her amiodarone.  Since then she has had no evidence of atrial fibrillation on her device.  She has had no palpitations, presyncope or syncope.  She is actually lost quite a bit of weight because she cannot eat because of the restrictions on her warfarin.  She has had no new shortness of breath, PND or orthopnea.  She has had no edema.  She has had no fevers or chills.   Past Medical History:  Diagnosis Date    Atrial flutter ([place])    Ablated 1998 Dr. Caryl Comes   Basal cell carcinoma 06/05/2019   R low back, [facility] 07/16/19   Basal cell carcinoma 04/15/2010   Right post shoulder   Basal cell carcinoma 04/15/2010   Mid back   Basal cell carcinoma (BCC) 09/29/2016   Left post shoulder. Superficial and nodular.   BCC (basal cell carcinoma) 07/21/2021   right mid back, [facility] 09/15/2021   BCC (basal cell carcinoma) 01/27/2022   left lower abdomen, [facility] 07/28/22   BCC (basal cell carcinoma) 07/28/2022   right lateral forehead, [facility] 08/10/22   BCC (basal cell carcinoma) 07/28/2022   left buttock, [facility] 08/10/22   Bleeding gastric ulcer    back in 2000   Breast cancer ([place])    COPD (chronic obstructive pulmonary disease) ([place])    from radiation   GERD (gastroesophageal reflux disease)    History of basal cell carcinoma (BCC) 03/22/2021   right neck and right medial ear, Moh's   Hodgkin's lymphoma ([place])    Treated with radiation and chemo   Mitral regurgitation    pvc   Osteoporosis    lumbar verterbral fracture, left ankle fracture, dexa 2015   Pancreatitis    Pneumonia    Prediabetes    Presence of permanent cardiac pacemaker  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 09/13/2017   25 mm Medtronic Mosaic porcine stented bioprosthetic tissue valve   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   Left medial lower leg   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   right anterior thigh, [facility] 08/10/22   Ulcer     Past Surgical History:  Procedure Laterality Date   AORTIC VALVE REPLACEMENT N/A 04/07/2022   Procedure: AORTIC VALVE REPLACEMENT (AVR), USING INSPIRIS 19 MM AORTIC VALVE;  Surgeon: [name], MD;  Location: [place] OR;  Service: Open Heart Surgery;  Laterality: N/A;   BREAST SURGERY     breast cancer since 2005   left mastectomy   BUBBLE STUDY  01/12/2022   Procedure: BUBBLE STUDY;  Surgeon: Geralynn Rile, MD;  Location: [place];  Service:  Cardiovascular;;   CARDIAC ELECTROPHYSIOLOGY [place] REPLACEMENT N/A    [employer] 03/19/2021   CATARACT EXTRACTION, BILATERAL     COLONOSCOPY     IR RADIOLOGY PERIPHERAL GUIDED IV START  08/03/2017   IR THORACENTESIS ASP PLEURAL SPACE W/IMG GUIDE  05/23/2022   IR THORACENTESIS ASP PLEURAL SPACE W/IMG GUIDE  05/24/2022   IR US GUIDE VASC ACCESS RIGHT  08/03/2017   LAPAROTOMY     MASTECTOMY  2005   Left-axillary   MITRAL VALVE REPAIR Right 09/13/2017   Procedure: MINIMALLY INVASIVE MITRAL VALVE REPLACEMENT (MVR) with Medtronic Mosaic Porcine Heart Valve size 49m;  Surgeon: ORexene Alberts MD;  [place] [facility]  Service: Open Heart Surgery;  Laterality: Right;   ORIF ANKLE FRACTURE Left 03/21/2014   Procedure: LEFT ANKLE FRACTURE OPEN TREATMENT BILMALLEOLAR INCLUDES INTERNAL FIXATION ;  Surgeon: TRenette Butters MD;  [place] [facility]  Service: Orthopedics;  Laterality: Left;   PACEMAKER IMPLANT N/A 09/19/2017   Medtronic Azure XT MRI conditional dual-chamber pacemaker for symptomatic complete heart block  by Dr ARayann Heman  RIGHT/LEFT HEART CATH AND CORONARY ANGIOGRAPHY N/A 06/27/2017   Procedure: Right/Left Heart Cath and Coronary Angiography;  Surgeon: MLarey [name] MD;  [facility];  Service: Cardiovascular;  Laterality: N/A;   SPLENECTOMY  1974   hodgkins disease   TEE WITHOUT CARDIOVERSION  06/12/2012   Procedure: TRANSESOPHAGEAL ECHOCARDIOGRAM (TEE);  Surgeon: DLarey [name] MD;  [place] [facility]  Service: Cardiovascular;  Laterality: N/A;   TEE WITHOUT CARDIOVERSION N/A 06/29/2017   Procedure: TRANSESOPHAGEAL ECHOCARDIOGRAM (TEE);  Surgeon: SJerline Pain MD;  [facility];  Service: Cardiovascular;  Laterality: N/A;   TEE WITHOUT CARDIOVERSION N/A 09/13/2017   Procedure: TRANSESOPHAGEAL ECHOCARDIOGRAM (TEE);  Surgeon: ORexene Alberts MD;  [place] [facility]  Service: Open Heart Surgery;  Laterality:  N/A;   TEE WITHOUT CARDIOVERSION N/A 01/12/2022   Procedure: TRANSESOPHAGEAL ECHOCARDIOGRAM (TEE);  Surgeon: OGeralynn Rile MD;  [place] [facility]  Service: Cardiovascular;  Laterality: N/A;   TEE WITHOUT CARDIOVERSION N/A 04/07/2022   Procedure: TRANSESOPHAGEAL ECHOCARDIOGRAM (TEE);  Surgeon: BGaye Pollack MD;  [place] [facility]  Service: Open Heart Surgery;  Laterality: N/A;   TONSILLECTOMY AND ADENOIDECTOMY       Current Outpatient Medications  Medication Sig Dispense Refill   acetaminophen (TYLENOL) 325 MG tablet Take 2 tablets (650 mg total) by mouth 2 (two) times daily as needed for moderate pain or headache.     alendronate (FOSAMAX) 70 MG tablet Take 1 tablet (70 mg total) by mouth once a week. (Patient taking differently: Take  70 mg by mouth once a week. Sunday) 12 tablet 3   atorvastatin (LIPITOR) 20 MG tablet Take 1 tablet (20 mg total) by mouth daily. 30 tablet 6   Blood Glucose Monitoring Suppl (ACCU-CHEK GUIDE) w/Device KIT 1 Units by Does not apply route daily. 1 kit 0   Calcium Carbonate (CALCIUM 600 PO) Take 600 mg by mouth 2 (two) times daily. Lunch and Dinner     chlorthalidone (HYGROTON) 25 MG tablet TAKE 1 TABLET BY MOUTH ONCE A DAY 90 tablet 1   CREON 36000-114000 units CPEP capsule Take 36,000 Units by mouth 3 (three) times daily before meals.     esomeprazole (NEXIUM) 40 MG capsule Take 40 mg by mouth daily before breakfast.   12   fluticasone (FLONASE) 50 MCG/ACT nasal spray Place 2 sprays into both nostrils daily. (Patient taking differently: Place 2 sprays into both nostrils daily as needed for rhinitis.) 16 g 6   glucose blood (ACCU-CHEK GUIDE) test strip USE TO TEST BLOOD SUGARS EVERY MORNING 100 each 3   Lancets (ACCU-CHEK SOFT TOUCH) lancets Check BS QAM 100 each 3   Lancets Misc. (ACCU-CHEK SOFTCLIX LANCET DEV) KIT Check BS QAM 1 kit 0   levothyroxine (SYNTHROID) 50 MCG tablet TAKE 1 TABLET BY MOUTH ONCE A DAY (Patient taking differently: Take 50  mcg by mouth daily before breakfast.) 90 tablet 3   metoprolol tartrate (LOPRESSOR) 100 MG tablet Take 1 tablet (100 mg total) by mouth 2 (two) times daily. 180 tablet 3   Multiple Vitamin (MULTIVITAMIN WITH MINERALS) TABS tablet Take 1 tablet by mouth daily with lunch. Centrum Silver.     mupirocin ointment (BACTROBAN) 2 % Apply to healing wounds QD-BID until healed. 22 g 0   potassium chloride SA (KLOR-CON M) 20 MEQ tablet TAKE 1 TABLET BY MOUTH ONCE A DAY 30 tablet 10   torsemide (DEMADEX) 10 MG tablet Take 2 tablets (20 mg total) by mouth daily. 28 tablet 0   traMADol (ULTRAM) 50 MG tablet Take 1 tablet (50 mg total) by mouth every 6 (six) hours as needed for moderate pain. 28 tablet 0   Current Facility-Administered Medications  Medication Dose Route Frequency Provider Last Rate Last Admin   torsemide (DEMADEX) tablet 60 mg  60 mg Oral Daily [name], MD        Allergies:   Aspirin, Azithromycin, Penicillins, Sulfa antibiotics, and Cheese    ROS:  Please see the history of present illness.   Otherwise, review of systems are positive for none.   All other systems are reviewed and negative.    PHYSICAL EXAM: VS:  BP 138/68 (BP Location: Right Arm, Patient Position: Sitting, Cuff Size: Normal)   Pulse 87   Ht _0  (1.676 m)   Wt 103 lb 12.8 oz (47.1 kg)   SpO2 95%   BMI 16.75 kg/m  , BMI Body mass index is 16.75 kg/m. GENERAL:  Well appearing NECK:  No jugular venous distention, waveform within normal limits, carotid upstroke brisk and symmetric, no bruits, no thyromegaly LUNGS:  Clear to auscultation bilaterally CHEST:  Well healed sternotomy scar. HEART:  PMI not displaced or sustained,S1 and S2 within normal limits, no S3, no S4, no clicks, no rubs, no murmurs ABD:  Flat, positive bowel sounds normal in frequency in pitch, no bruits, no rebound, no guarding, no midline pulsatile mass, no hepatomegaly, no splenomegaly EXT:  2 plus pulses throughout, no edema, no cyanosis  no clubbing     GENERAL:  Well appearing NECK:  No jugular venous distention, waveform within normal limits, carotid upstroke brisk and symmetric, no bruits, no thyromegaly LUNGS:  Clear to auscultation bilaterally CHEST:  Unremarkable HEART:  PMI not displaced or sustained,S1 and S2 within normal limits, no S3, no S4, no clicks, no rubs, third left intercostal space systolic murmur, no diastolic murmurs ABD:  Flat, positive bowel sounds normal in frequency in pitch, no bruits, no rebound, no guarding, no midline pulsatile mass, no hepatomegaly, no splenomegaly EXT:  2 plus pulses throughout, no edema, no cyanosis no clubbing   EKG:  EKG is not ordered today.  Recent Labs: 04/08/2022: Magnesium 3.2 05/26/2022: B Natriuretic Peptide 216.0 09/08/2022: ALT 18; BUN 28; Creat 1.54; Hemoglobin 11.6; Platelets 451; Potassium 5.1; Sodium 140    Lipid Panel    Component Value Date/Time   CHOL 136 03/25/2021 0811   TRIG 88 03/25/2021 0811   HDL 62 03/25/2021 0811   CHOLHDL 2.2 03/25/2021 0811   VLDL 31 (H) 07/07/2016 0847   LDLCALC 57 03/25/2021 0811      Wt Readings from Last 3 Encounters:  10/21/22 103 lb 12.8 oz (47.1 kg)  08/03/22 111 lb (50.3 kg)  07/26/22 113 lb 11.2 oz (51.6 kg)      Other studies Reviewed: Additional studies/ records that were reviewed today include: Most recent hospital visit Review of the above records demonstrates:  Please see elsewhere in the note.     ASSESSMENT AND PLAN:  MVR:   She has had stable valve replacement in June.  She understands endocarditis prophylaxis.  AVR:   She is doing well status post AVR.  As above  FLUTTER:    She has had no further arrhythmias.  She can come off the Coumadin.  She cannot take aspirin because of GI bleeding which was life-threatening previously.  I will put her on Plavix for the same reason.   HTN:   The blood pressure is at target.  No change  Pulmonic stenosis and regurgitation: She does have mildly  related pulmonary pressures.  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  Right ventricular function is normal.  We will follow this clinically and with repeat echo probably in June.  Signed, Minus Breeding, MD  10/21/2022 8:44 AM    [place]

## 2022-10-21 ENCOUNTER — Encounter: Payer: Self-pay | Admitting: Cardiology

## 2022-10-21 ENCOUNTER — Ambulatory Visit (INDEPENDENT_AMBULATORY_CARE_PROVIDER_SITE_OTHER): Payer: Medicare PPO | Admitting: *Deleted

## 2022-10-21 ENCOUNTER — Ambulatory Visit: Payer: Medicare PPO | Attending: Cardiology | Admitting: Cardiology

## 2022-10-21 VITALS — BP 138/68 | HR 87 | Ht 66.0 in | Wt 103.8 lb

## 2022-10-21 DIAGNOSIS — Z9889 Other specified postprocedural states: Secondary | ICD-10-CM

## 2022-10-21 DIAGNOSIS — Z7901 Long term (current) use of anticoagulants: Secondary | ICD-10-CM

## 2022-10-21 DIAGNOSIS — I10 Essential (primary) hypertension: Secondary | ICD-10-CM

## 2022-10-21 DIAGNOSIS — I4892 Unspecified atrial flutter: Secondary | ICD-10-CM

## 2022-10-21 DIAGNOSIS — Z952 Presence of prosthetic heart valve: Secondary | ICD-10-CM | POA: Diagnosis not present

## 2022-10-21 LAB — POCT INR: INR: 1.6 — AB (ref 2.0–3.0)

## 2022-10-21 NOTE — Patient Instructions (Signed)
Description   Today take 2 tablets then start taking 2 tablets daily EXCEPT 1 tablets on Sundays, Tuesdays, and Fridays. Repeat INR 4 weeks. Coumadin Clinic# [phone]

## 2022-10-21 NOTE — Patient Instructions (Signed)
Medication Instructions:  STOP Warfarin  *If you need a refill on your cardiac medications before your next appointment, please call your pharmacy*  Follow-Up: At [facility], you and your health needs are our priority.  As part of our continuing mission to provide you with exceptional heart care, we have created designated Provider Care Teams.  These Care Teams include your primary Cardiologist (physician) and Advanced Practice Providers (APPs -  Physician Assistants and Nurse Practitioners) who all work together to provide you with the care you need, when you need it.  We recommend signing up for the patient portal called "MyChart".  Sign up information is provided on this After Visit Summary.  MyChart is used to connect with patients for Virtual Visits (Telemedicine).  Patients are able to view lab/test results, encounter notes, upcoming appointments, etc.  Non-urgent messages can be sent to your provider as well.   To learn more about what you can do with MyChart, go to [email or web address].    Your next appointment:   6 month(s)  The format for your next appointment:   In Person  Provider:   Minus Breeding, MD

## 2022-11-07 ENCOUNTER — Other Ambulatory Visit: Payer: Self-pay | Admitting: Cardiology

## 2022-11-14 ENCOUNTER — Telehealth: Payer: Self-pay | Admitting: Cardiology

## 2022-11-14 NOTE — Telephone Encounter (Signed)
Spoke with pt, her appointment has been cancelled as her warfarin was stopped.

## 2022-11-14 NOTE — Telephone Encounter (Signed)
Patient calling in to see if her appt on Friday 12/8 is needed. She states dr took her off of coumadin. Please advise

## 2022-11-15 ENCOUNTER — Other Ambulatory Visit: Payer: Self-pay | Admitting: Family Medicine

## 2022-11-22 ENCOUNTER — Other Ambulatory Visit: Payer: Self-pay | Admitting: Internal Medicine

## 2022-11-26 ENCOUNTER — Other Ambulatory Visit: Payer: Self-pay | Admitting: Family Medicine

## 2022-12-06 ENCOUNTER — Ambulatory Visit (INDEPENDENT_AMBULATORY_CARE_PROVIDER_SITE_OTHER): Payer: Medicare PPO

## 2022-12-06 VITALS — Ht 65.0 in | Wt 101.0 lb

## 2022-12-06 DIAGNOSIS — Z0000 Encounter for general adult medical examination without abnormal findings: Secondary | ICD-10-CM | POA: Diagnosis not present

## 2022-12-06 NOTE — Patient Instructions (Signed)
Patricia [name] , Thank you for taking time to come for your Medicare Wellness Visit. I appreciate your ongoing commitment to your health goals. Please review the following plan we discussed and let me know if I can assist you in the future.   These are the goals we discussed:  Goals      Patient Stated     I would like to be maintain my current level.        This is a list of the screening recommended for you and due dates:  Health Maintenance  Topic Date Due   Colon Cancer Screening  11/06/2021   COVID-19 Vaccine (5 - 2023-24 season) 08/12/2022   Mammogram  08/11/2023   Medicare Annual Wellness Visit  12/07/2023   DTaP/Tdap/Td vaccine (2 - Td or Tdap) 12/11/2031   Pneumonia Vaccine  Completed   Flu Shot  Completed   DEXA scan (bone density measurement)  Completed   Hepatitis C Screening: USPSTF Recommendation to screen - Ages 91-79 yo.  Completed   Zoster (Shingles) Vaccine  Completed   HPV Vaccine  Aged Out    Advanced directives: We have a copy of your advanced directives available in your record should your provider ever need to access them.   Conditions/risks identified: Aim for 30 minutes of exercise or brisk walking, 6-8 glasses of water, and 5 servings of fruits and vegetables each day.   Next appointment: Follow up in one year for your annual wellness visit    Preventive Care 65 Years and Older, Female Preventive care refers to lifestyle choices and visits with your health care provider that can promote health and wellness. What does preventive care include? A yearly physical exam. This is also called an annual well check. Dental exams once or twice a year. Routine eye exams. Ask your health care provider how often you should have your eyes checked. Personal lifestyle choices, including: Daily care of your teeth and gums. Regular physical activity. Eating a healthy diet. Avoiding tobacco and drug use. Limiting alcohol use. Practicing safe sex. Taking low-dose  aspirin every day. Taking vitamin and mineral supplements as recommended by your health care provider. What happens during an annual well check? The services and screenings done by your health care provider during your annual well check will depend on your age, overall health, lifestyle risk factors, and family history of disease. Counseling  Your health care provider may ask you questions about your: Alcohol use. Tobacco use. Drug use. Emotional well-being. Home and relationship well-being. Sexual activity. Eating habits. History of falls. Memory and ability to understand (cognition). Work and work Statistician. Reproductive health. Screening  You may have the following tests or measurements: Height, weight, and BMI. Blood pressure. Lipid and cholesterol levels. These may be checked every 5 years, or more frequently if you are over 105 years old. Skin check. Lung cancer screening. You may have this screening every year starting at age 63 if you have a 30-pack-year history of smoking and currently smoke or have quit within the past 15 years. Fecal occult blood test (FOBT) of the stool. You may have this test every year starting at age 30. Flexible sigmoidoscopy or colonoscopy. You may have a sigmoidoscopy every 5 years or a colonoscopy every 10 years starting at age 66. Hepatitis C blood test. Hepatitis B blood test. Sexually transmitted disease (STD) testing. Diabetes screening. This is done by checking your blood sugar (glucose) after you have not eaten for a while (fasting). You may have this  done every 1-3 years. Bone density scan. This is done to screen for osteoporosis. You may have this done starting at age 76. Mammogram. This may be done every 1-2 years. Talk to your health care provider about how often you should have regular mammograms. Talk with your health care provider about your test results, treatment options, and if necessary, the need for more tests. Vaccines  Your  health care provider may recommend certain vaccines, such as: Influenza vaccine. This is recommended every year. Tetanus, diphtheria, and acellular pertussis (Tdap, Td) vaccine. You may need a Td booster every 10 years. Zoster vaccine. You may need this after age 62. Pneumococcal 13-valent conjugate (PCV13) vaccine. One dose is recommended after age 53. Pneumococcal polysaccharide (PPSV23) vaccine. One dose is recommended after age 74. Talk to your health care provider about which screenings and vaccines you need and how often you need them. This information is not intended to replace advice given to you by your health care provider. Make sure you discuss any questions you have with your health care provider. Document Released: 12/25/2015 Document Revised: 08/17/2016 Document Reviewed: 09/29/2015 Elsevier Interactive Patient Education  2017 [place] Prevention in the Home Falls can cause injuries. They can happen to people of all ages. There are many things you can do to make your home safe and to help prevent falls. What can I do on the outside of my home? Regularly fix the edges of walkways and driveways and fix any cracks. Remove anything that might make you trip as you walk through a door, such as a raised step or threshold. Trim any bushes or trees on the path to your home. Use bright outdoor lighting. Clear any walking paths of anything that might make someone trip, such as rocks or tools. Regularly check to see if handrails are loose or broken. Make sure that both sides of any steps have handrails. Any raised decks and porches should have guardrails on the edges. Have any leaves, snow, or ice cleared regularly. Use sand or salt on walking paths during winter. Clean up any spills in your garage right away. This includes oil or grease spills. What can I do in the bathroom? Use night lights. Install grab bars by the toilet and in the tub and shower. Do not use towel bars as  grab bars. Use non-skid mats or decals in the tub or shower. If you need to sit down in the shower, use a plastic, non-slip stool. Keep the floor dry. Clean up any water that spills on the floor as soon as it happens. Remove soap buildup in the tub or shower regularly. Attach bath mats securely with double-sided non-slip rug tape. Do not have throw rugs and other things on the floor that can make you trip. What can I do in the bedroom? Use night lights. Make sure that you have a light by your bed that is easy to reach. Do not use any sheets or blankets that are too big for your bed. They should not hang down onto the floor. Have a firm chair that has side arms. You can use this for support while you get dressed. Do not have throw rugs and other things on the floor that can make you trip. What can I do in the kitchen? Clean up any spills right away. Avoid walking on wet floors. Keep items that you use a lot in easy-to-reach places. If you need to reach something above you, use a strong step stool that  has a grab bar. Keep electrical cords out of the way. Do not use floor polish or wax that makes floors slippery. If you must use wax, use non-skid floor wax. Do not have throw rugs and other things on the floor that can make you trip. What can I do with my stairs? Do not leave any items on the stairs. Make sure that there are handrails on both sides of the stairs and use them. Fix handrails that are broken or loose. Make sure that handrails are as long as the stairways. Check any carpeting to make sure that it is firmly attached to the stairs. Fix any carpet that is loose or worn. Avoid having throw rugs at the top or bottom of the stairs. If you do have throw rugs, attach them to the floor with carpet tape. Make sure that you have a light switch at the top of the stairs and the bottom of the stairs. If you do not have them, ask someone to add them for you. What else can I do to help prevent  falls? Wear shoes that: Do not have high heels. Have rubber bottoms. Are comfortable and fit you well. Are closed at the toe. Do not wear sandals. If you use a stepladder: Make sure that it is fully opened. Do not climb a closed stepladder. Make sure that both sides of the stepladder are locked into place. Ask someone to hold it for you, if possible. Clearly mark and make sure that you can see: Any grab bars or handrails. First and last steps. Where the edge of each step is. Use tools that help you move around (mobility aids) if they are needed. These include: Canes. Walkers. Scooters. Crutches. Turn on the lights when you go into a dark area. Replace any light bulbs as soon as they burn out. Set up your furniture so you have a clear path. Avoid moving your furniture around. If any of your floors are uneven, fix them. If there are any pets around you, be aware of where they are. Review your medicines with your doctor. Some medicines can make you feel dizzy. This can increase your chance of falling. Ask your doctor what other things that you can do to help prevent falls. This information is not intended to replace advice given to you by your health care provider. Make sure you discuss any questions you have with your health care provider. Document Released: 09/24/2009 Document Revised: 05/05/2016 Document Reviewed: 01/02/2015 Elsevier Interactive Patient Education  2017 [employer].

## 2022-12-06 NOTE — Progress Notes (Signed)
Subjective:   Patricia [name] is a 71 y.o. female who presents for Medicare Annual (Subsequent) preventive examination.  I connected with  Patricia [name] on 12/06/22 by a audio enabled telemedicine application and verified that I am speaking with the correct person using two identifiers.  Patient Location: Home  Provider Location: Office/Clinic  I discussed the limitations of evaluation and management by telemedicine. The patient expressed understanding and agreed to proceed.  Review of Systems     Cardiac Risk Factors include: advanced age (>75mn, >>4women);hypertension;dyslipidemia     Objective:    Today's Vitals   12/06/22 1403  Weight: 101 lb (45.8 kg)  Height: _0  (1.651 m)   Body mass index is 16.81 kg/m.     12/06/2022    2:08 PM 05/26/2022   11:00 AM 05/22/2022    4:59 PM 05/04/2022    9:39 AM 04/12/2022    8:00 PM 04/05/2022   11:12 AM 01/12/2022   11:09 AM  Advanced Directives  Does Patient Have a Medical Advance Directive? Yes  Yes Yes Yes Yes No  Type of Advance Directive Living will;Healthcare Power of [place]   Does patient want to make changes to medical advance directive? No - Patient declined No - Patient declined  Yes (MAU/Ambulatory/Procedural Areas - Information given) No - Patient declined No - Patient declined   Copy of HMaver[place]in Chart? No - copy requested No - copy requested No - copy requested, Physician notified  No - copy requested No - copy requested   Would patient like information on creating a medical advance directive? No - Patient declined      No - Patient declined    Current Medications (verified) Outpatient Encounter Medications as of 12/06/2022  Medication Sig   acetaminophen (TYLENOL) 325 MG tablet Take 2 tablets (650 mg total) by mouth 2 (two) times  daily as needed for moderate pain or headache.   alendronate (FOSAMAX) 70 MG tablet Take 1 tablet (70 mg total) by mouth once a week. (Patient taking differently: Take 70 mg by mouth once a week. Sunday)   atorvastatin (LIPITOR) 20 MG tablet Take 1 tablet (20 mg total) by mouth daily.   Blood Glucose Monitoring Suppl (ACCU-CHEK GUIDE) w/Device KIT 1 Units by Does not apply route daily.   Calcium Carbonate (CALCIUM 600 PO) Take 600 mg by mouth 2 (two) times daily. Lunch and Dinner   chlorthalidone (HYGROTON) 25 MG tablet TAKE 1 TABLET BY MOUTH ONCE A DAY   CREON 36000-114000 units CPEP capsule Take 36,000 Units by mouth 3 (three) times daily before meals.   esomeprazole (NEXIUM) 40 MG capsule Take 40 mg by mouth daily before breakfast.    fluticasone (FLONASE) 50 MCG/ACT nasal spray Place 2 sprays into both nostrils daily. (Patient taking differently: Place 2 sprays into both nostrils daily as needed for rhinitis.)   glucose blood (ACCU-CHEK GUIDE) test strip USE TO TEST BLOOD SUGARS EVERY MORNING   Lancets (ACCU-CHEK SOFT TOUCH) lancets Check BS QAM   Lancets Misc. (ACCU-CHEK SOFTCLIX LANCET DEV) KIT Check BS QAM   levothyroxine (SYNTHROID) 50 MCG tablet TAKE 1 TABLET BY MOUTH ONCE A DAY (Patient taking differently: Take 50 mcg by mouth daily before breakfast.)   metoprolol tartrate (LOPRESSOR) 100 MG tablet Take 1 tablet (100 mg total) by mouth 2 (two) times daily.  Multiple Vitamin (MULTIVITAMIN WITH MINERALS) TABS tablet Take 1 tablet by mouth daily with lunch. Centrum Silver.   potassium chloride SA (KLOR-CON M) 20 MEQ tablet TAKE 1 TABLET BY MOUTH ONCE A DAY   torsemide (DEMADEX) 10 MG tablet TAKE 2 TABLETS BY MOUTH ONCE A DAY   [DISCONTINUED] mupirocin ointment (BACTROBAN) 2 % Apply to healing wounds QD-BID until healed.   [DISCONTINUED] traMADol (ULTRAM) 50 MG tablet Take 1 tablet (50 mg total) by mouth every 6 (six) hours as needed for moderate pain.   Facility-Administered Encounter  Medications as of 12/06/2022  Medication   torsemide (DEMADEX) tablet 60 mg    Allergies (verified) Aspirin, Azithromycin, Penicillins, Sulfa antibiotics, and Cheese   History: Past Medical History:  Diagnosis Date   Atrial flutter (HCC)    Ablated 1998 Dr. Caryl Comes   Basal cell carcinoma 06/05/2019   R low back, [facility] 07/16/19   Basal cell carcinoma 04/15/2010   Right post shoulder   Basal cell carcinoma 04/15/2010   Mid back   Basal cell carcinoma (BCC) 09/29/2016   Left post shoulder. Superficial and nodular.   BCC (basal cell carcinoma) 07/21/2021   right mid back, [facility] 09/15/2021   BCC (basal cell carcinoma) 01/27/2022   left lower abdomen, [facility] 07/28/22   BCC (basal cell carcinoma) 07/28/2022   right lateral forehead, [facility] 08/10/22   BCC (basal cell carcinoma) 07/28/2022   left buttock, [facility] 08/10/22   Bleeding gastric ulcer    back in 2000   Breast cancer ([place])    COPD (chronic obstructive pulmonary disease) ([place])    from radiation   GERD (gastroesophageal reflux disease)    History of basal cell carcinoma (BCC) 03/22/2021   right neck and right medial ear, Moh's   Hodgkin's lymphoma ([place])    Treated with radiation and chemo   Mitral regurgitation    pvc   Osteoporosis    lumbar verterbral fracture, left ankle fracture, dexa 2015   Pancreatitis    Pneumonia    Prediabetes    Presence of permanent cardiac pacemaker    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 09/13/2017   25 mm Medtronic Mosaic porcine stented bioprosthetic tissue valve   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   Left medial lower leg   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   right anterior thigh, [facility] 08/10/22   Ulcer    Past Surgical History:  Procedure Laterality Date   AORTIC VALVE REPLACEMENT N/A 04/07/2022   Procedure: AORTIC VALVE REPLACEMENT (AVR), USING INSPIRIS 19 MM AORTIC VALVE;  Surgeon: [name], MD;  Location: [facility];  Service: Open  Heart Surgery;  Laterality: N/A;   BREAST SURGERY     breast cancer since 2005   left mastectomy   BUBBLE STUDY  01/12/2022   Procedure: BUBBLE STUDY;  Surgeon: Geralynn Rile, MD;  Location: [facility];  Service: Cardiovascular;;   CARDIAC ELECTROPHYSIOLOGY [place] VALVE REPLACEMENT N/A    [employer] 03/19/2021   CATARACT EXTRACTION, BILATERAL     COLONOSCOPY     IR RADIOLOGY PERIPHERAL GUIDED IV START  08/03/2017   IR THORACENTESIS ASP PLEURAL SPACE W/IMG GUIDE  05/23/2022   IR THORACENTESIS ASP PLEURAL SPACE W/IMG GUIDE  05/24/2022   IR US GUIDE VASC ACCESS RIGHT  08/03/2017   LAPAROTOMY     MASTECTOMY  2005   Left-axillary   MITRAL VALVE REPAIR Right 09/13/2017   Procedure: MINIMALLY INVASIVE MITRAL  VALVE REPLACEMENT (MVR) with Medtronic Mosaic Porcine Heart Valve size 6m;  Surgeon: ORexene Alberts MD;  [place]  Service: Open Heart Surgery;  Laterality: Right;   ORIF ANKLE FRACTURE Left 03/21/2014   Procedure: LEFT ANKLE FRACTURE OPEN TREATMENT BILMALLEOLAR INCLUDES INTERNAL FIXATION ;  Surgeon: TRenette Butters MD;  [place]  Service: Orthopedics;  Laterality: Left;   PACEMAKER IMPLANT N/A 09/19/2017   Medtronic Azure XT MRI conditional dual-chamber pacemaker for symptomatic complete heart block  by Dr ARayann Heman  RIGHT/LEFT HEART CATH AND CORONARY ANGIOGRAPHY N/A 06/27/2017   Procedure: Right/Left Heart Cath and Coronary Angiography;  Surgeon: MLarey [name] MD;  [facility];  Service: Cardiovascular;  Laterality: N/A;   SPLENECTOMY  1974   hodgkins disease   TEE WITHOUT CARDIOVERSION  06/12/2012   Procedure: TRANSESOPHAGEAL ECHOCARDIOGRAM (TEE);  Surgeon: DLarey [name] MD;  [place] [facility]  Service: Cardiovascular;  Laterality: N/A;   TEE WITHOUT CARDIOVERSION N/A 06/29/2017   Procedure: TRANSESOPHAGEAL ECHOCARDIOGRAM (TEE);  Surgeon: SJerline Pain MD;  [facility]  Service:  Cardiovascular;  Laterality: N/A;   TEE WITHOUT CARDIOVERSION N/A 09/13/2017   Procedure: TRANSESOPHAGEAL ECHOCARDIOGRAM (TEE);  Surgeon: ORexene Alberts MD;  [place]  Service: Open Heart Surgery;  Laterality: N/A;   TEE WITHOUT CARDIOVERSION N/A 01/12/2022   Procedure: TRANSESOPHAGEAL ECHOCARDIOGRAM (TEE);  Surgeon: OGeralynn Rile MD;  [place]  Service: Cardiovascular;  Laterality: N/A;   TEE WITHOUT CARDIOVERSION N/A 04/07/2022   Procedure: TRANSESOPHAGEAL ECHOCARDIOGRAM (TEE);  Surgeon: BGaye Pollack MD;  [place] [facility]  Service: Open Heart Surgery;  Laterality: N/A;   TONSILLECTOMY AND ADENOIDECTOMY     Family History  Problem Relation Age of Onset   Rheumatic fever Father        Died suddenly age 72  Diabetes Father    Cancer Father        colon   Heart disease Father        rheumatic fever x 3   Arthritis Maternal Grandmother    Hypertension Maternal Grandmother    Diabetes Paternal Grandmother    Vision loss Paternal Grandmother        glaucoma   Social History   Socioeconomic History   Marital status: Widowed    Spouse name: Not on file   Number of children: Not on file   Years of education: Not on file   Highest education level: Master's degree (e.g., MA, MS, MEng, MEd, MSW, MBA)  Occupational History   Occupation: TPharmacist, hospital(retired)  Tobacco Use   Smoking status: Never   Smokeless tobacco: Never  Vaping Use   Vaping Use: Never used  Substance and Sexual Activity   Alcohol use: No   Drug use: No   Sexual activity: Yes  Other Topics Concern   Not on file  Social History Narrative   [place] passed away from [place] 92018-09-29   Social Determinants of Health   Financial Resource Strain: Low Risk  (12/06/2022)   Overall Financial Resource Strain (CARDIA)    Difficulty of Paying Living Expenses: Not hard at all  Food Insecurity: No Food Insecurity (12/06/2022)   Hunger Vital Sign    Worried About Running Out of Food in the  Last Year: Never true    Ran Out of Food in the Last Year: Never true  Transportation Needs: No Transportation Needs (12/06/2022)   PRAPARE - Transportation    Lack of  Transportation (Medical): No    Lack of Transportation (Non-Medical): No  Physical Activity: Sufficiently Active (12/06/2022)   Exercise Vital Sign    Days of Exercise per Week: 5 days    Minutes of Exercise per Session: 30 min  Stress: No Stress Concern Present (12/06/2022)   [place]    Feeling of Stress : Not at all  Social Connections: Moderately Integrated (12/06/2022)   Social Connection and Isolation Panel [NHANES]    Frequency of Communication with Friends and Family: More than three times a week    Frequency of Social Gatherings with Friends and Family: Three times a week    Attends Religious Services: More than 4 times per year    Active Member of Clubs or Organizations: Yes    Attends Archivist Meetings: More than 4 times per year    Marital Status: Widowed    Tobacco Counseling Counseling given: Not Answered   Clinical Intake:  Pre-visit preparation completed: Yes  Pain : No/denies pain  Diabetes: No  How often do you need to have someone help you when you read instructions, pamphlets, or other written materials from your doctor or pharmacy?: 1 - Never  Diabetic?No   Interpreter Needed?: No  Information entered by :: Denman George LPN   Activities of Daily Living    12/06/2022    2:09 PM 12/02/2022    8:40 AM  In your present state of health, do you have any difficulty performing the following activities:  Hearing? 0 0  Vision? 0 0  Difficulty concentrating or making decisions? 0 0  Walking or climbing stairs? 0 0  Dressing or bathing? 0 0  Doing errands, shopping? 0 0  Preparing Food and eating ? N N  Using the Toilet? N N  In the past six months, have you accidently leaked urine? N N  Do you have  problems with loss of bowel control? N N  Managing your Medications? N N  Managing your Finances? N N  Housekeeping or managing your Housekeeping? N N    Patient Care Team: Susy Frizzle, MD as PCP - General (Family Medicine) Minus Breeding, MD as PCP - Cardiology (Cardiology) Evans Lance, MD as PCP - Electrophysiology (Cardiology) Juanita Craver, MD as Consulting Physician (Gastroenterology) Minus Breeding, MD as Consulting Physician (Cardiology) [name], MD as Consulting Physician (Cardiothoracic Surgery) Luberta Mutter, MD as Consulting Physician (Ophthalmology)  Indicate any recent Medical Services you may have received from other than [facility] providers in the past year (date may be approximate).     Assessment:   This is a routine wellness examination for Patricia [name].  Hearing/Vision screen Hearing Screening - Comments:: Denies hearing difficulties    Dietary issues and exercise activities discussed: Current Exercise Habits: Home exercise routine, Type of exercise: walking, Time (Minutes): 30, Frequency (Times/Week): 4, Weekly Exercise (Minutes/Week): 120   Goals Addressed             This Visit's Progress    Patient Stated   On track    I would like to be maintain my current level.      Depression Screen    12/06/2022    2:10 PM 08/01/2022    9:53 AM 05/12/2022   11:34 AM 03/22/2021    2:17 PM 12/19/2019    8:22 AM 02/09/2018   10:25 AM 01/23/2018    9:04 AM  PHQ 2/9 Scores  PHQ - 2 Score 0 0 0  0 0 0 2  PHQ- 9 Score  _0 Fall Risk    12/02/2022    8:40 AM 11/04/2022    9:22 AM 05/04/2022    9:38 AM 03/22/2021    2:17 PM 12/19/2019    8:22 AM  Fall Risk   Falls in the past year? 0 0 0 0 0  Number falls in past yr: 0 0  0   Injury with Fall? 0 0  0   Risk for fall due to :   Medication side effect No Fall Risks   Follow up   Education provided;Falls prevention discussed Falls evaluation completed;Falls prevention discussed Falls evaluation  completed    FALL RISK PREVENTION PERTAINING TO THE HOME:  Any stairs in or around the home? Yes  If so, are there any without handrails? No  Home free of loose throw rugs in walkways, pet beds, electrical cords, etc? Yes  Adequate lighting in your home to reduce risk of falls? Yes   ASSISTIVE DEVICES UTILIZED TO PREVENT FALLS:  Life alert? No  Use of a cane, walker or w/c? No  Grab bars in the bathroom? Yes  Shower chair or bench in shower? No  Elevated toilet seat or a handicapped toilet? Yes   TIMED UP AND GO:  Was the test performed? No . Telephonic visit   Cognitive Function:        12/06/2022    2:09 PM  6CIT Screen  What Year? 0 points  What month? 0 points  What time? 0 points  Count back from 20 0 points  Months in reverse 0 points  Repeat phrase 0 points  Total Score 0 points    Immunizations Immunization History  Administered Date(s) Administered   Fluad Quad(high Dose 65+) 09/08/2022   Influenza Split 09/15/2015   Influenza, High Dose Seasonal PF 09/16/2017, 08/02/2019   Influenza,inj,Quad PF,6+ Mos 09/15/2016   Influenza-Unspecified 08/03/2018, 09/03/2020   PFIZER(Purple Top)SARS-COV-2 Vaccination 01/02/2020, 01/24/2020, 09/26/2020   Pfizer Covid-19 Vaccine Bivalent Booster 66yr & up 04/14/2021   Pneumococcal Conjugate-13 07/07/2016   Pneumococcal Polysaccharide-23 01/23/2018   Tdap 12/10/2021   Zoster Recombinat (Shingrix) 08/03/2018, 12/17/2018    TDAP status: Up to date  Flu Vaccine status: Up to date  Pneumococcal vaccine status: Up to date  Covid-19 vaccine status: Information provided on how to obtain vaccines.   Qualifies for Shingles Vaccine? Yes   Zostavax completed No   Shingrix Completed?: Yes  Screening Tests Health Maintenance  Topic Date Due   COLONOSCOPY (Pts [phone] coverage will need to be confirmed)  11/06/2021   COVID-19 Vaccine (5 - 2023-24 season) 08/12/2022   MAMMOGRAM  08/11/2023   Medicare Annual  Wellness (AWV)  12/07/2023   DTaP/Tdap/Td (2 - Td or Tdap) 12/11/2031   Pneumonia Vaccine 6541Years old  Completed   INFLUENZA VACCINE  Completed   DEXA SCAN  Completed   Hepatitis C Screening  Completed   Zoster Vaccines- Shingrix  Completed   HPV VACCINES  Aged Out    Health Maintenance  Health Maintenance Due  Topic Date Due   COLONOSCOPY (Pts [phone] coverage will need to be confirmed)  11/06/2021   COVID-19 Vaccine (5 - 2023-24 season) 08/12/2022    Colorectal cancer screening: Type of screening: Colonoscopy. Completed Will discuss with Dr. ManCollene Mares when repeat is needed. . Repeat every as directed  years  Mammogram status: Completed 08/10/22. Repeat every year  Bone Density status:  Completed 03/16/20. Results reflect: Bone density results: OSTEOPOROSIS. Repeat every 2 years.  Lung Cancer Screening: (Low Dose CT Chest recommended if Age 42-80 years, 30 pack-year currently smoking OR have quit w/in 15years.) does not qualify.   Lung Cancer Screening Referral: n/a   Additional Screening:  Hepatitis C Screening: does qualify; Completed 03/20/18  Vision Screening: Recommended annual ophthalmology exams for early detection of glaucoma and other disorders of the eye. Is the patient up to date with their annual eye exam?  Yes  Who is the provider or what is the name of the office in which the patient attends annual eye exams? Dr. Ellie Lunch  If pt is not established with a provider, would they like to be referred to a provider to establish care? No .   Dental Screening: Recommended annual dental exams for proper oral hygiene  Community Resource Referral / Chronic Care Management: CRR required this visit?  No   CCM required this visit?  No      Plan:     I have personally reviewed and noted the following in the patient's chart:   Medical and social history Use of alcohol, tobacco or illicit drugs  Current medications and supplements including opioid prescriptions.  Patient is not currently taking opioid prescriptions. Functional ability and status Nutritional status Physical activity Advanced directives List of other physicians Hospitalizations, surgeries, and ER visits in previous 12 months Vitals Screenings to include cognitive, depression, and falls Referrals and appointments  In addition, I have reviewed and discussed with patient certain preventive protocols, quality metrics, and best practice recommendations. A written personalized care plan for preventive services as well as general preventive health recommendations were provided to patient.     Vanetta Mulders, [place]   20/35/5974   Due to this being a virtual visit, the after visit summary with patients personalized plan was offered to patient via mail or my-chart. Patient would like to access on my-chart  Nurse Notes: No concerns

## 2022-12-14 ENCOUNTER — Other Ambulatory Visit: Payer: Self-pay

## 2022-12-14 DIAGNOSIS — Z79899 Other long term (current) drug therapy: Secondary | ICD-10-CM

## 2022-12-15 ENCOUNTER — Other Ambulatory Visit: Payer: Self-pay | Admitting: Family Medicine

## 2022-12-16 ENCOUNTER — Telehealth: Payer: Self-pay | Admitting: Pharmacist

## 2022-12-16 NOTE — Progress Notes (Signed)
Care Management & Coordination Services Pharmacy Team  Reason for Encounter: Chart Prep For Initial Visit With Clinical Pharmacist  Contacted patient on 12/16/2022   Recent office visits:  None  Recent consult visits:  10/21/2022 OV (Cardiology) Minus Breeding, MD; no medication changes indicated.  09/14/2022 OV (Cardiothoracic) [name], MD; I told her to decrease the torsemide to 30 mg daily.  08/10/2022 OV ([facility]) Laurence Ferrari, [place], MD;  no medication changes indicated.  08/03/2022 OV (Cardiothoracic) [name], MD; Her torsemide was increased today to 60 mg daily.   07/28/2022 OV ([facility]) Laurence Ferrari, [place], MD; no medication changes indicated.  07/21/2022 OV (Cardiology) Minus Breeding, MD;  She very much wants to come off of the amiodarone.  I think this is reasonable but to continue on the anticoagulation.  She thinks she can feel it when she is in fibrillation   Hospital visits:  None in previous 6 months  Medications: Outpatient Encounter Medications as of 12/16/2022  Medication Sig   acetaminophen (TYLENOL) 325 MG tablet Take 2 tablets (650 mg total) by mouth 2 (two) times daily as needed for moderate pain or headache.   alendronate (FOSAMAX) 70 MG tablet Take 1 tablet (70 mg total) by mouth once a week. (Patient taking differently: Take 70 mg by mouth once a week. Sunday)   atorvastatin (LIPITOR) 20 MG tablet Take 1 tablet (20 mg total) by mouth daily.   Blood Glucose Monitoring Suppl (ACCU-CHEK GUIDE) w/Device KIT 1 Units by Does not apply route daily.   Calcium Carbonate (CALCIUM 600 PO) Take 600 mg by mouth 2 (two) times daily. Lunch and Dinner   chlorthalidone (HYGROTON) 25 MG tablet TAKE 1 TABLET BY MOUTH ONCE A DAY   CREON 36000-114000 units CPEP capsule Take 36,000 Units by mouth 3 (three) times daily before meals.   esomeprazole (NEXIUM) 40 MG capsule Take 40 mg by mouth daily before breakfast.    fluticasone (FLONASE) 50 MCG/ACT nasal spray Place 2 sprays into  both nostrils daily as needed for rhinitis.   glucose blood (ACCU-CHEK GUIDE) test strip USE TO TEST BLOOD SUGARS EVERY MORNING   Lancets (ACCU-CHEK SOFT TOUCH) lancets Check BS QAM   Lancets Misc. (ACCU-CHEK SOFTCLIX LANCET DEV) KIT Check BS QAM   levothyroxine (SYNTHROID) 50 MCG tablet TAKE 1 TABLET BY MOUTH ONCE A DAY (Patient taking differently: Take 50 mcg by mouth daily before breakfast.)   metoprolol tartrate (LOPRESSOR) 100 MG tablet Take 1 tablet (100 mg total) by mouth 2 (two) times daily.   Multiple Vitamin (MULTIVITAMIN WITH MINERALS) TABS tablet Take 1 tablet by mouth daily with lunch. Centrum Silver.   potassium chloride SA (KLOR-CON M) 20 MEQ tablet TAKE 1 TABLET BY MOUTH ONCE A DAY   torsemide (DEMADEX) 10 MG tablet TAKE 2 TABLETS BY MOUTH ONCE A DAY   Facility-Administered Encounter Medications as of 12/16/2022  Medication   torsemide (DEMADEX) tablet 60 mg   Lab Results  Component Value Date/Time   HGBA1C 6.8 (H) 04/05/2022 11:18 AM   HGBA1C 6.3 (H) 03/25/2021 08:11 AM    BP Readings from Last 3 Encounters:  10/21/22 138/68  08/03/22 (!) 140/70  07/21/22 136/78    Patient contacted to confirm telephone appointment with Leata Mouse PharmD, on 12/23/2022 at 9 am.  Do you have any problems getting your medications? No If yes what types of problems are you experiencing?  None  What is your top health concern you would like to discuss at your upcoming visit? No  Have  you seen any other providers since your last visit with PCP? No    Star Rating Drugs:  Atorvastatin 20 mg last filled 12/14/2022 30 DS   Care Gaps: Annual wellness visit in last year? Yes  Future Appointments  Date Time Provider [place]  12/23/2022  9:00 AM Edythe Clarity, [place] None  12/27/2022  7:05 AM [facility] DEVICE REMOTES [facility]  12/28/2022  8:50 AM [place], [place], MD [facility] None  03/28/2023  7:05 AM [facility] DEVICE REMOTES [facility]   06/27/2023  7:05 AM [facility] DEVICE REMOTES [facility]  09/26/2023  7:05 AM [facility] DEVICE REMOTES [facility]  12/12/2023  2:00 PM [facility]SE HEALTH ADVISOR [facility]  12/26/2023  7:05 AM [facility] DEVICE REMOTES [facility]   April D Calhoun, [place] Pharmacist Assistant [phone]

## 2022-12-21 ENCOUNTER — Other Ambulatory Visit: Payer: Self-pay | Admitting: Family Medicine

## 2022-12-21 NOTE — Telephone Encounter (Signed)
Requested medication (s) are due for refill today: routing for review  Requested medication (s) are on the active medication list:yes  Last refill:  12/23/21  Future visit scheduled: no  Notes to clinic:  Unable to refill per protocol due to failed labs, no updated results.      Requested Prescriptions  Pending Prescriptions Disp Refills   alendronate (FOSAMAX) 70 MG tablet [Pharmacy Med Name: ALENDRONATE SODIUM 70 MG TAB] 12 tablet 3    Sig: TAKE 1 TABLET BY MOUTH EVERY 7 DAYS. TAKE WITH A FULL GLASS OF WATER ON AN EMPTY STOMACH. (NEED APPT FOR REFILLS)     Endocrinology:  Bisphosphonates Failed - 12/21/2022 11:03 AM      Failed - Vitamin D in normal range and within 360 days    Vit D, 25-Hydroxy  Date Value Ref Range Status  06/03/2011 59 30 - 89 ng/mL Final    Comment:    This assay accurately quantifies Vitamin D, which is the sum of the25-Hydroxy forms of Vitamin D2 and D3.  Studies have shown that theoptimum concentration of 25-Hydroxy Vitamin D is 30 ng/mL or higher. Concentrations of Vitamin D between 20 and 29 ng/mL  are considered tobe insufficient and concentrations less than 20 ng/mL are consideredto be deficient for Vitamin D.         Failed - Cr in normal range and within 360 days    Creat  Date Value Ref Range Status  09/08/2022 1.54 (H) 0.60 - 1.00 mg/dL Final         Failed - Mg Level in normal range and within 360 days    Magnesium  Date Value Ref Range Status  04/08/2022 3.2 (H) 1.7 - 2.4 mg/dL Final    Comment:    Performed at [facility], [place] [address] [place],  [address]         Failed - Phosphate in normal range and within 360 days    Phosphorus  Date Value Ref Range Status  12/25/2014 4.4 2.3 - 4.6 mg/dL Final         Failed - Valid encounter within last 12 months    Recent Outpatient Visits           1 year ago Dyspnea, unspecified type   [place] [facility] [facility], Cammie Mcgee, MD   1 year ago Bilateral lower  extremity edema   [place] Eulogio Bear, NP   1 year ago Subacute cough   [place] Susy Frizzle, MD   2 years ago Acute right-sided low back pain without sciatica   [place] Susy Frizzle, MD   3 years ago Postmenopausal estrogen deficiency   [place] Pickard, Cammie Mcgee, MD              Failed - Bone Mineral Density or Dexa Scan completed in the last 2 years      27 - Ca in normal range and within 360 days    Calcium  Date Value Ref Range Status  09/08/2022 9.9 8.6 - 10.4 mg/dL Final   Calcium, Ion  Date Value Ref Range Status  04/08/2022 1.08 (L) 1.15 - 1.40 mmol/L Final         Passed - eGFR is 30 or above and within 360 days    GFR, Est African American  Date Value Ref Range Status  11/23/2020 51 (L) > OR = 60 mL/min/1.61m Final  GFR, Est Non African American  Date Value Ref Range Status  11/23/2020 44 (L) > OR = 60 mL/min/1.58m Final   GFR, Estimated  Date Value Ref Range Status  05/28/2022 30 (L) >60 mL/min Final    Comment:    (NOTE) Calculated using the CKD-EPI Creatinine Equation (2021)    eGFR  Date Value Ref Range Status  06/06/2022 38 (L) > OR = 60 mL/min/1.777mFinal    Comment:    The eGFR is based on the CKD-EPI 2021 equation. To calculate  the new eGFR from a previous Creatinine or Cystatin C result, go to https://www.kidney.org/professionals/ kdoqi/gfr%5Fcalculator   04/26/2022 48 (L) >59 mL/min/1.73 Final

## 2022-12-21 NOTE — Progress Notes (Signed)
Care Management & Coordination Services Pharmacy Note  12/23/2022 Name:  Patricia [name] MRN:  [id number] DOB:  [birth year]  Summary: PharmD Initial visit.  Patient doing well post heart surgery working to gain strength back.  Torsemide is now once daily '10mg'$  which is controlling fluid status.  Sugars have improved since weight lost post surgery.  LDL well controlled but from 2022, tolerates statin.  Monitoring BP at home.  Recommendations/Changes made from today's visit: No changes - monitor fluids  Follow up plan: FU 6 months CMA to check BP 3 months   Subjective: Patricia [name] is an 72 y.o. year old female who is a primary patient of Pickard, Cammie Mcgee, MD.  The care coordination team was consulted for assistance with disease management and care coordination needs.    Engaged with patient by telephone for initial visit.  Patient Care Team: Susy Frizzle, MD as PCP - General (Family Medicine) Minus Breeding, MD as PCP - Cardiology (Cardiology) Evans Lance, MD as PCP - Electrophysiology (Cardiology) Juanita Craver, MD as Consulting Physician (Gastroenterology) Minus Breeding, MD as Consulting Physician (Cardiology) [name], MD as Consulting Physician (Cardiothoracic Surgery) Luberta Mutter, MD as Consulting Physician (Ophthalmology)  Recent office visits:  None   Recent consult visits:  10/21/2022 OV (Cardiology) Minus Breeding, MD; no medication changes indicated.   09/14/2022 OV (Cardiothoracic) [name], MD; I told her to decrease the torsemide to 30 mg daily.   08/10/2022 OV ([facility]) Laurence Ferrari, [place], MD;  no medication changes indicated.   08/03/2022 OV (Cardiothoracic) [name], MD; Her torsemide was increased today to 60 mg daily.    07/28/2022 OV ([facility]) Laurence Ferrari, [place], MD; no medication changes indicated.   07/21/2022 OV (Cardiology) Minus Breeding, MD;  She very much wants to come off of the amiodarone.  I think this is reasonable  but to continue on the anticoagulation.  She thinks she can feel it when she is in fibrillation    Hospital visits:  None in previous 6 months   Objective:  Lab Results  Component Value Date   CREATININE 1.54 (H) 09/08/2022   BUN 28 (H) 09/08/2022   EGFR 38 (L) 06/06/2022   GFRNONAA 30 (L) 05/28/2022   GFRAA 51 (L) 11/23/2020   NA 140 09/08/2022   K 5.1 09/08/2022   CALCIUM 9.9 09/08/2022   CO2 33 (H) 09/08/2022   GLUCOSE 124 (H) 09/08/2022    Lab Results  Component Value Date/Time   HGBA1C 6.8 (H) 04/05/2022 11:18 AM   HGBA1C 6.3 (H) 03/25/2021 08:11 AM    Last diabetic Eye exam: No results found for: "HMDIABEYEEXA"  Last diabetic Foot exam: No results found for: "HMDIABFOOTEX"   Lab Results  Component Value Date   CHOL 136 03/25/2021   HDL 62 03/25/2021   LDLCALC 57 03/25/2021   TRIG 88 03/25/2021   CHOLHDL 2.2 03/25/2021       Latest Ref Rng & Units 09/08/2022    8:57 AM 04/08/2022    2:29 AM 04/05/2022   11:18 AM  Hepatic Function  Total Protein 6.1 - 8.1 g/dL 6.9  5.0  6.7   Albumin 3.5 - 5.0 g/dL  3.4  4.1   AST 10 - 35 U/L 18  33  27   ALT 6 - 29 U/L '18  25  27   '$ Alk Phosphatase 38 - 126 U/L  40  62   Total Bilirubin 0.2 - 1.2 mg/dL 0.7  1.7  0.9  Lab Results  Component Value Date/Time   TSH 2.75 12/16/2019 08:12 AM   TSH 4.21 07/18/2018 08:54 AM   FREET4 1.1 03/20/2018 08:36 AM   FREET4 1.1 01/23/2018 09:33 AM       Latest Ref Rng & Units 09/08/2022    8:57 AM 06/06/2022    2:55 PM 05/25/2022    2:38 AM  CBC  WBC 3.8 - 10.8 Thousand/uL 7.9  11.0  10.3   Hemoglobin 11.7 - 15.5 g/dL 11.6  12.2  10.3   Hematocrit 35.0 - 45.0 % 35.8  37.0  32.2   Platelets 140 - 400 Thousand/uL 451  503  393     Lab Results  Component Value Date/Time   VD25OH 59 06/03/2011 01:41 PM   VD25OH 48 06/02/2010 02:11 PM    Clinical ASCVD: Yes  The ASCVD Risk score (Arnett DK, et al., 2019) failed to calculate for the following reasons:   The systolic blood  pressure is missing       12/06/2022    2:10 PM 08/01/2022    9:53 AM 05/12/2022   11:34 AM  Depression screen PHQ 2/9  Decreased Interest 0 0 0  Down, Depressed, Hopeless 0 0 0  PHQ - 2 Score 0 0 0  Altered sleeping  0 1  Tired, decreased energy  1 3  Change in appetite  0 3  Feeling bad or failure about yourself   0 0  Trouble concentrating  0 0  Moving slowly or fidgety/restless  0 1  Suicidal thoughts  0 0  PHQ-9 Score  1 8  Difficult doing work/chores  Not difficult at all Somewhat difficult     Social History   Tobacco Use  Smoking Status Never  Smokeless Tobacco Never   BP Readings from Last 3 Encounters:  10/21/22 138/68  08/03/22 (!) 140/70  07/21/22 136/78   Pulse Readings from Last 3 Encounters:  10/21/22 87  08/03/22 84  07/21/22 89   Wt Readings from Last 3 Encounters:  12/06/22 101 lb (45.8 kg)  10/21/22 103 lb 12.8 oz (47.1 kg)  08/03/22 111 lb (50.3 kg)   BMI Readings from Last 3 Encounters:  12/06/22 16.81 kg/m  10/21/22 16.75 kg/m  08/03/22 18.47 kg/m    Allergies  Allergen Reactions   Aspirin Other (See Comments)    Past bleeding ulcer. GI upset   Azithromycin Other (See Comments)    pancreatitis   Penicillins Anaphylaxis, Hives, Swelling and Other (See Comments)    PATIENT HAS HAD A PCN REACTION WITH IMMEDIATE RASH, FACIAL/TONGUE/THROAT SWELLING, SOB, OR LIGHTHEADEDNESS WITH HYPOTENSION:  #  #  #  YES  #  #  #   HAS PT DEVELOPED SEVERE RASH INVOLVING MUCUS MEMBRANES or SKIN NECROSIS: #  #  #  YES  #  #  #  Has patient had a PCN reaction that required hospitalization: No Has patient had a PCN reaction occurring within the last 10 years: No    Sulfa Antibiotics Hives and Rash   Cheese Nausea And Vomiting    Medications Reviewed Today     Reviewed by Bernita Raisin, LPN (Licensed Practical Nurse) on 12/06/22 at [place] List Status: <None>   Medication Order Taking? Sig Documenting Provider Last Dose Status Informant   acetaminophen (TYLENOL) 325 MG tablet [id number] Yes Take 2 tablets (650 mg total) by mouth 2 (two) times daily as needed for moderate pain or headache. Nani Skillern, PA-C Taking Active Self  alendronate (FOSAMAX) 70 MG tablet [id number] Yes Take 1 tablet (70 mg total) by mouth once a week.  Patient taking differently: Take 70 mg by mouth once a week. Sunday   Susy Frizzle, MD Taking Active Self  atorvastatin (LIPITOR) 20 MG tablet [id number] Yes Take 1 tablet (20 mg total) by mouth daily. Minus Breeding, MD Taking Active   Blood Glucose Monitoring Suppl (ACCU-CHEK GUIDE) w/Device KIT [id number] Yes 1 Units by Does not apply route daily. Susy Frizzle, MD Taking Active Self  Calcium Carbonate (CALCIUM 600 PO) [id number] Yes Take 600 mg by mouth 2 (two) times daily. Lunch and Careers adviser, Historical, MD Taking Active Self  chlorthalidone (HYGROTON) 25 MG tablet [id number] Yes TAKE 1 TABLET BY MOUTH ONCE A DAY Evans Lance, MD Taking Active   CREON [phone] units CPEP capsule [id number] Yes Take 36,000 Units by mouth 3 (three) times daily before meals. [provider] Taking Active Self  esomeprazole (NEXIUM) 40 MG capsule [id number] Yes Take 40 mg by mouth daily before breakfast.  [provider] Taking Active Self           Med Note Caryn Section, KYLE A   Fri Jun 23, 2017  1:53 PM)    fluticasone (FLONASE) 50 MCG/ACT nasal spray [id number] Yes Place 2 sprays into both nostrils daily.  Patient taking differently: Place 2 sprays into both nostrils daily as needed for rhinitis.   Susy Frizzle, MD Taking Active Self  glucose blood (ACCU-CHEK GUIDE) test strip [id number] Yes USE TO TEST BLOOD SUGARS EVERY MORNING Susy Frizzle, MD Taking Active Self  Lancets (ACCU-CHEK SOFT TOUCH) lancets [id number] Yes Check BS QAM Susy Frizzle, MD Taking Active Self  Lancets Misc. (ACCU-CHEK SOFTCLIX LANCET DEV) KIT [id number] Yes Check BS QAM Susy Frizzle, MD Taking  Active Self  levothyroxine (SYNTHROID) 50 MCG tablet [id number] Yes TAKE 1 TABLET BY MOUTH ONCE A DAY  Patient taking differently: Take 50 mcg by mouth daily before breakfast.   Susy Frizzle, MD Taking Active Self  metoprolol tartrate (LOPRESSOR) 100 MG tablet [id number] Yes Take 1 tablet (100 mg total) by mouth 2 (two) times daily. Minus Breeding, MD Taking Active   Multiple Vitamin (MULTIVITAMIN WITH MINERALS) TABS tablet [id number] Yes Take 1 tablet by mouth daily with lunch. Centrum Silver. [provider] Taking Active Self  potassium chloride SA (KLOR-CON M) 20 MEQ tablet [id number] Yes TAKE 1 TABLET BY MOUTH ONCE A Kelton Pillar, MD Taking Active   torsemide (DEMADEX) 10 MG tablet [id number] Yes TAKE 2 TABLETS BY MOUTH ONCE A Kelton Pillar, MD Taking Active   torsemide [facility]) tablet 60 mg [id number]   [name], MD  Active             Patient Active Problem List   Diagnosis Date Noted   Pleural effusion 05/22/2022   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/07/2022   Pacemaker 03/08/2022   CHB (complete heart block) ([place]) 12/27/2021   S/P MVR (mitral valve repair) 10/02/2019   Pneumonia    Hodgkin's lymphoma ([place])    GERD (gastroesophageal reflux disease)    COPD (chronic obstructive pulmonary disease) ([place])    Atrial flutter (HCC)    Breast cancer (HCC)    Bleeding gastric ulcer    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 09/13/2017   Pulmonary HTN ([place]) 06/25/2017   Mitral valve stenosis 04/25/2017   Aortic valve regurgitation 04/25/2017   Osteoporosis    Pancreatitis  Prediabetes    Acute respiratory failure with hypoxia (HCC) 01/07/2015   Hodgkin lymphoma ([place]) 01/07/2015   Abdominal pain, epigastric    Diarrhea    AKI (acute kidney injury) ([place])    Hyponatremia    Acute pancreatitis 12/25/2014   Leukocytosis    Mitral regurgitation 06/07/2012   Syncope 04/26/2012   HTN (hypertension) 04/26/2012    Abnormal stress test 04/26/2012   Hyperkalemia 04/26/2012   Tachycardia 04/26/2012    Immunization History  Administered Date(s) Administered   Fluad Quad(high Dose 65+) 09/08/2022   Influenza Split 09/15/2015   Influenza, High Dose Seasonal PF 09/16/2017, 08/02/2019   Influenza,inj,Quad PF,6+ Mos 09/15/2016   Influenza-Unspecified 08/03/2018, 09/03/2020   PFIZER(Purple Top)SARS-COV-2 Vaccination 01/02/2020, 01/24/2020, 09/26/2020   Pfizer Covid-19 Vaccine Bivalent Booster 38yr & up 04/14/2021   Pneumococcal Conjugate-13 07/07/2016   Pneumococcal Polysaccharide-23 01/23/2018   Tdap 12/10/2021   Zoster Recombinat (Shingrix) 08/03/2018, 12/17/2018     Compliance/Adherence/Medication fill history: Care Gaps: None  Star-Rating Drugs: Atorvastatin 20 mg last filled 12/14/2022 30 DS   SDOH:  (Social Determinants of Health) assessments and interventions performed: Yes Financial Resource Strain: Low Risk  (12/06/2022)   Overall Financial Resource Strain (CARDIA)    Difficulty of Paying Living Expenses: Not hard at all   Food Insecurity: No Food Insecurity (12/06/2022)   Hunger Vital Sign    Worried About Running Out of Food in the Last Year: Never true    Ran Out of Food in the Last Year: Never true    SDOH Interventions    Flowsheet Row [facility] [facility] [place] [facility] [place]     Food Insecurity Interventions Intervention Not Indicated -- Intervention Not Indicated  Housing Interventions Intervention Not Indicated -- Intervention Not Indicated  Transportation Interventions Intervention Not Indicated -- Intervention Not Indicated  Utilities Interventions Intervention Not Indicated -- --  Alcohol Usage Interventions Intervention Not Indicated (Score <7) -- --  Depression Interventions/Treatment  -- PWSF6-8 Score <4 Follow-up Not Indicated --  Financial Strain Interventions Intervention Not Indicated -- Intervention Not Indicated  Physical Activity Interventions Intervention Not Indicated -- Intervention Not Indicated  Stress Interventions Intervention Not Indicated -- Intervention Not Indicated  Social Connections Interventions Intervention Not Indicated -- Intervention Not Indicated      SDOH Screenings   Food Insecurity: No Food Insecurity (12/06/2022)  Housing: Low Risk  (12/06/2022)  Transportation Needs: No Transportation Needs (12/06/2022)  Utilities: Not At Risk (12/06/2022)  Alcohol Screen: Low Risk  (12/06/2022)  Depression (PHQ2-9): Low Risk  (12/06/2022)  Financial Resource Strain: Low Risk  (12/06/2022)  Physical Activity: Sufficiently Active (12/06/2022)  Social Connections: Moderately Integrated (12/06/2022)  Stress: No Stress Concern Present (12/06/2022)  Tobacco Use: Low Risk  (12/06/2022)    Medication Assistance: None required.  Patient affirms current coverage meets needs.  Medication Access: Within the past 30 days, how often has patient missed a dose of medication? 0 Is a pillbox or other method used to improve adherence? Yes  Factors that may affect medication adherence? no barriers identified Are meds synced by current pharmacy? No  Are meds delivered by current pharmacy? No  Does patient experience delays in picking up medications due to transportation concerns? No   [facility] Services Reviewed: Is patient disadvantaged to use [facility]?: No  Current Rx insurance plan: [employer] Name and location of Current pharmacy:  [facility]  [place], [place] [address] Phone: [phone] Fax: [phone]  [facility] services reviewed with patient today?: Yes  Patient requests to transfer care to [facility] Pharmacy?: No  Reason patient declined to change pharmacies: Loyalty to other  pharmacy/Patient preference   Assessment/Plan   Hypertension (BP goal <130/80) -Controlled -Current treatment: Chlorthalidone '25mg'$  Appropriate, Effective, Safe, Accessible Metoprolol tartrate '100mg'$  BID Appropriate, Effective, Safe, Accessible -Medications previously tried: diltiazem  -Current home readings: "good" did not have logs to discuss but reports they have been good.  Checks a few times per week -Current dietary habits: eats about what she wants -Current exercise habits: walking -Denies hypotensive/hypertensive symptoms -Educated on BP goals and benefits of medications for prevention of heart attack, stroke and kidney damage; Exercise goal of 150 minutes per week; Importance of home blood pressure monitoring; -Counseled to monitor BP at home a few times per week, document, and provide log at future appointments -Recommended to continue current medication  Osteoporosis (Goal Prevent fracture) -Controlled -Last DEXA Scan: 03/16/20   T-Score femoral neck: -2.6 -Patient is a candidate for pharmacologic treatment due to T-Score < -2.5 in femoral neck -Current treatment  Alendronate '70mg'$  once weekly Appropriate, Effective, Safe, Accessible -Medications previously tried: none noted  -Recommend [phone] units of vitamin D daily. Recommend 1200 mg of calcium daily from dietary and supplemental sources. Counseled on oral bisphosphonate administration: take in the morning, 30 minutes prior to food with 6-8 oz of water. Do not lie down for at least 30 minutes after taking. -Recommended to continue current medication Due for FU DEXA scan, patient started med in 2021 - may need drug holiday in April 2026. Counseled on fall prevention - mentions dizziness when she gets up during the night - counseled her to go slow, use walker, clear floors, etc.  Pre-Diabetes (A1c goal <6.5%) -Not ideally controlled -Current medications: none -Medications previously tried: none  -Current home  glucose readings fasting glucose: 105, 99, 112, 116 -Denies hypoglycemic/hyperglycemic symptoms -Current meal patterns: eats about what she wants - had lost weight post heart surgery and is now down to ~105 lbs.  Sugars have gotten much better  -Educated on A1c and blood sugar goals; Exercise goal of 150 minutes per week; -Counseled to check feet daily and get yearly eye exams - Continue current monitoring - watch carbs and excess sugars.  Mitral regurgitation (Goal: Minimize fluid) -Controlled -Current treatment  Torsemide '10mg'$  daily Appropriate, Effective, Safe, Accessible -Medications previously tried: none noted (dose recently decreased)  -Counseled on s/sx of fluid overload.  She is monitoring weights daily, knows to take extra torsemide if weight is > 3 lbs overnight or 5 lbs in a week.  Beverly Milch, PharmD, CPP Clinical Pharmacist Practitioner [place] [phone]

## 2022-12-22 ENCOUNTER — Other Ambulatory Visit: Payer: Self-pay | Admitting: Family Medicine

## 2022-12-22 DIAGNOSIS — Z86010 Personal history of colonic polyps: Secondary | ICD-10-CM | POA: Diagnosis not present

## 2022-12-22 DIAGNOSIS — K219 Gastro-esophageal reflux disease without esophagitis: Secondary | ICD-10-CM | POA: Diagnosis not present

## 2022-12-22 DIAGNOSIS — I10 Essential (primary) hypertension: Secondary | ICD-10-CM | POA: Diagnosis not present

## 2022-12-22 DIAGNOSIS — Z1211 Encounter for screening for malignant neoplasm of colon: Secondary | ICD-10-CM | POA: Diagnosis not present

## 2022-12-22 DIAGNOSIS — K8681 Exocrine pancreatic insufficiency: Secondary | ICD-10-CM | POA: Diagnosis not present

## 2022-12-22 DIAGNOSIS — R634 Abnormal weight loss: Secondary | ICD-10-CM | POA: Diagnosis not present

## 2022-12-22 DIAGNOSIS — Z800 Family history of malignant neoplasm of digestive organs: Secondary | ICD-10-CM | POA: Diagnosis not present

## 2022-12-23 ENCOUNTER — Ambulatory Visit: Payer: Medicare PPO | Admitting: Pharmacist

## 2022-12-27 ENCOUNTER — Other Ambulatory Visit: Payer: Self-pay | Admitting: *Deleted

## 2022-12-27 ENCOUNTER — Ambulatory Visit: Payer: Medicare PPO | Attending: Internal Medicine

## 2022-12-27 DIAGNOSIS — I442 Atrioventricular block, complete: Secondary | ICD-10-CM

## 2022-12-27 LAB — CUP PACEART REMOTE DEVICE CHECK
Battery Remaining Longevity: 57 mo
Battery Voltage: 2.96 V
Brady Statistic AP VP Percent: 24.15 %
Brady Statistic AP VS Percent: 0 %
Brady Statistic AS VP Percent: 75.84 %
Brady Statistic AS VS Percent: 0.01 %
Brady Statistic RA Percent Paced: 24.15 %
Brady Statistic RV Percent Paced: 99.99 %
Date Time Interrogation Session: 20240115221633
Implantable Lead Connection Status: 753985
Implantable Lead Connection Status: 753985
Implantable Lead Implant Date: 20181009
Implantable Lead Implant Date: 20181009
Implantable Lead Location: 753859
Implantable Lead Location: 753860
Implantable Lead Model: 5076
Implantable Lead Model: 5076
Implantable Pulse Generator Implant Date: 20181009
Lead Channel Impedance Value: 285 Ohm
Lead Channel Impedance Value: 323 Ohm
Lead Channel Impedance Value: 418 Ohm
Lead Channel Impedance Value: 475 Ohm
Lead Channel Pacing Threshold Amplitude: 1 V
Lead Channel Pacing Threshold Amplitude: 1.125 V
Lead Channel Pacing Threshold Pulse Width: 0.4 ms
Lead Channel Pacing Threshold Pulse Width: 0.4 ms
Lead Channel Sensing Intrinsic Amplitude: 2.5 mV
Lead Channel Sensing Intrinsic Amplitude: 2.5 mV
Lead Channel Sensing Intrinsic Amplitude: 5.25 mV
Lead Channel Setting Pacing Amplitude: 2.5 V
Lead Channel Setting Pacing Amplitude: 2.5 V
Lead Channel Setting Pacing Pulse Width: 0.4 ms
Lead Channel Setting Sensing Sensitivity: 2 mV
Zone Setting Status: 755011
Zone Setting Status: 755011

## 2022-12-27 NOTE — Telephone Encounter (Signed)
-----  Message from Edythe Clarity, [facility] sent at 12/27/2022  8:41 AM EST ----- Patient called to request refill of Fosamax.  I will call her to schedule an appointment if she needs to.  Thanks!  Beverly Milch, PharmD, CPP Clinical Pharmacist Practitioner [place] [phone]

## 2022-12-27 NOTE — Telephone Encounter (Signed)
Requested medication (s) are due for refill today - expired Rx  Requested medication (s) are on the active medication list -yes  Future visit scheduled -no  Last refill: 12/23/21 #12 3RF  Notes to clinic: expired Rx, last OV 06/06/22- fails imaging follow, some labs, due 6 month follow up if appropriate for patient follow up  Requested Prescriptions  Pending Prescriptions Disp Refills   alendronate (FOSAMAX) 70 MG tablet 12 tablet 3    Sig: Take 1 tablet (70 mg total) by mouth once a week.     Endocrinology:  Bisphosphonates Failed - 12/27/2022  9:02 AM      Failed - Vitamin D in normal range and within 360 days    Vit D, 25-Hydroxy  Date Value Ref Range Status  06/03/2011 59 30 - 89 ng/mL Final    Comment:    This assay accurately quantifies Vitamin D, which is the sum of the25-Hydroxy forms of Vitamin D2 and D3.  Studies have shown that theoptimum concentration of 25-Hydroxy Vitamin D is 30 ng/mL or higher. Concentrations of Vitamin D between 20 and 29 ng/mL  are considered tobe insufficient and concentrations less than 20 ng/mL are consideredto be deficient for Vitamin D.         Failed - Cr in normal range and within 360 days    Creat  Date Value Ref Range Status  09/08/2022 1.54 (H) 0.60 - 1.00 mg/dL Final         Failed - Mg Level in normal range and within 360 days    Magnesium  Date Value Ref Range Status  04/08/2022 3.2 (H) 1.7 - 2.4 mg/dL Final    Comment:    Performed at [facility], [place] [address] [place], [place] [address]         Failed - Phosphate in normal range and within 360 days    Phosphorus  Date Value Ref Range Status  12/25/2014 4.4 2.3 - 4.6 mg/dL Final         Failed - Valid encounter within last 12 months    Recent Outpatient Visits           1 year ago Dyspnea, unspecified type   [place] [facility]enn[facility] Schaumann, Cammie Mcgee, MD   1 year ago Bilateral lower extremity edema   [place] Eulogio Bear, NP   1  year ago Subacute cough   [place] Susy Frizzle, MD   2 years ago Acute right-sided low back pain without sciatica   [place] Susy Frizzle, MD   3 years ago Postmenopausal estrogen deficiency   [place] Pickard, Cammie Mcgee, MD              Failed - Bone Mineral Density or Dexa Scan completed in the last 2 years      3 - Ca in normal range and within 360 days    Calcium  Date Value Ref Range Status  09/08/2022 9.9 8.6 - 10.4 mg/dL Final   Calcium, Ion  Date Value Ref Range Status  04/08/2022 1.08 (L) 1.15 - 1.40 mmol/L Final         Passed - eGFR is 30 or above and within 360 days    GFR, Est African American  Date Value Ref Range Status  11/23/2020 51 (L) > OR = 60 mL/min/1.49m Final   GFR, Est Non African American  Date Value Ref Range Status  11/23/2020  44 (L) > OR = 60 mL/min/1.32m Final   GFR, Estimated  Date Value Ref Range Status  05/28/2022 30 (L) >60 mL/min Final    Comment:    (NOTE) Calculated using the CKD-EPI Creatinine Equation (2021)    eGFR  Date Value Ref Range Status  06/06/2022 38 (L) > OR = 60 mL/min/1.771mFinal    Comment:    The eGFR is based on the CKD-EPI 2021 equation. To calculate  the new eGFR from a previous Creatinine or Cystatin C result, go to https://www.kidney.org/professionals/ kdoqi/gfr%5Fcalculator   04/26/2022 48 (L) >59 mL/min/1.73 Final            Requested Prescriptions  Pending Prescriptions Disp Refills   alendronate (FOSAMAX) 70 MG tablet 12 tablet 3    Sig: Take 1 tablet (70 mg total) by mouth once a week.     Endocrinology:  Bisphosphonates Failed - 12/27/2022  9:02 AM      Failed - Vitamin D in normal range and within 360 days    Vit D, 25-Hydroxy  Date Value Ref Range Status  06/03/2011 59 30 - 89 ng/mL Final    Comment:    This assay accurately quantifies Vitamin D, which is the sum of the25-Hydroxy forms of Vitamin D2 and D3.   Studies have shown that theoptimum concentration of 25-Hydroxy Vitamin D is 30 ng/mL or higher. Concentrations of Vitamin D between 20 and 29 ng/mL  are considered tobe insufficient and concentrations less than 20 ng/mL are consideredto be deficient for Vitamin D.         Failed - Cr in normal range and within 360 days    Creat  Date Value Ref Range Status  09/08/2022 1.54 (H) 0.60 - 1.00 mg/dL Final         Failed - Mg Level in normal range and within 360 days    Magnesium  Date Value Ref Range Status  04/08/2022 3.2 (H) 1.7 - 2.4 mg/dL Final    Comment:    Performed [facility] [place] [id number]       Failed - Phosphate in normal range and within 360 days    Phosphorus  Date Value Ref Range Status  12/25/2014 4.4 2.3 - 4.6 mg/dL Final         Failed - Valid encounter within last 12 months    Recent Outpatient Visits           1 year ago Dyspnea, unspecified type   [place] [facility] Mcge[facility]   1 year ago Bilateral lower extremity edema   [place] [facility]   1 year ago Subacute cough   [place]   2 years ago Acute right-sided low back pain without sciatica   [place]   3 years ago Postmenopausal estrogen deficiency   [place]ge[place]              Failed - Bone Mineral Density or Dexa Scan completed in the last 2 years      Pa78 Ca in normal range and within 360 days    Calcium  Date Value Ref Range Status  09/08/2022 9.9 8.6 - 10.4 mg/dL Final   Calcium, Ion  Date Value Ref Range Status  04/08/2022 1.08 (L) 1.15 - 1.40 mmol/L Final  Passed - eGFR is 30 or above and within 360 days    GFR, Est African American  Date Value Ref Range Status  11/23/2020 51 (L) > OR = 60 mL/min/1.69m Final   GFR, Est Non African American  Date Value Ref  Range Status  11/23/2020 44 (L) > OR = 60 mL/min/1.760mFinal   GFR, Estimated  Date Value Ref Range Status  05/28/2022 30 (L) >60 mL/min Final    Comment:    (NOTE) Calculated using the CKD-EPI Creatinine Equation (2021)    eGFR  Date Value Ref Range Status  06/06/2022 38 (L) > OR = 60 mL/min/1.7345minal    Comment:    The eGFR is based on the CKD-EPI 2021 equation. To calculate  the new eGFR from a previous Creatinine or Cystatin C result, go to https://www.kidney.org/professionals/ kdoqi/gfr%5Fcalculator   04/26/2022 48 (L) >59 mL/min/1.73 Final

## 2022-12-28 ENCOUNTER — Encounter: Payer: Medicare PPO | Admitting: Dermatology

## 2022-12-28 ENCOUNTER — Encounter: Payer: Self-pay | Admitting: Dermatology

## 2022-12-28 ENCOUNTER — Ambulatory Visit (INDEPENDENT_AMBULATORY_CARE_PROVIDER_SITE_OTHER): Payer: Medicare PPO | Admitting: Dermatology

## 2022-12-28 ENCOUNTER — Other Ambulatory Visit: Payer: Self-pay

## 2022-12-28 DIAGNOSIS — L814 Other melanin hyperpigmentation: Secondary | ICD-10-CM | POA: Diagnosis not present

## 2022-12-28 DIAGNOSIS — D229 Melanocytic nevi, unspecified: Secondary | ICD-10-CM | POA: Diagnosis not present

## 2022-12-28 DIAGNOSIS — L570 Actinic keratosis: Secondary | ICD-10-CM

## 2022-12-28 DIAGNOSIS — L578 Other skin changes due to chronic exposure to nonionizing radiation: Secondary | ICD-10-CM

## 2022-12-28 DIAGNOSIS — L821 Other seborrheic keratosis: Secondary | ICD-10-CM

## 2022-12-28 DIAGNOSIS — I8310 Varicose veins of unspecified lower extremity with inflammation: Secondary | ICD-10-CM | POA: Diagnosis not present

## 2022-12-28 MED ORDER — ALENDRONATE SODIUM 70 MG PO TABS: 70.0000 mg | ORAL_TABLET | ORAL | 3 refills | Status: DC

## 2022-12-28 NOTE — Patient Instructions (Signed)
Actinic keratoses are precancerous spots that appear secondary to cumulative UV radiation exposure/sun exposure over time. They are chronic with expected duration over 1 year. A portion of actinic keratoses will progress to squamous cell carcinoma of the skin. It is not possible to reliably predict which spots will progress to skin cancer and so treatment is recommended to prevent development of skin cancer.  Recommend daily broad spectrum sunscreen SPF 30+ to sun-exposed areas, reapply every 2 hours as needed.  Recommend staying in the shade or wearing long sleeves, sun glasses (UVA+UVB protection) and wide brim hats (4-inch brim around the entire circumference of the hat). Call for new or changing lesions.    Cryotherapy Aftercare  Wash gently with soap and water everyday.   Apply Vaseline and Band-Aid daily until healed.        Melanoma ABCDEs  Melanoma is the most dangerous type of skin cancer, and is the leading cause of death from skin disease.  You are more likely to develop melanoma if you: Have light-colored skin, light-colored eyes, or red or blond hair Spend a lot of time in the sun Tan regularly, either outdoors or in a tanning bed Have had blistering sunburns, especially during childhood Have a close family member who has had a melanoma Have atypical moles or large birthmarks  Early detection of melanoma is key since treatment is typically straightforward and cure rates are extremely high if we catch it early.   The first sign of melanoma is often a change in a mole or a new dark spot.  The ABCDE system is a way of remembering the signs of melanoma.  A for asymmetry:  The two halves do not match. B for border:  The edges of the growth are irregular. C for color:  A mixture of colors are present instead of an even brown color. D for diameter:  Melanomas are usually (but not always) greater than 96m - the size of a pencil eraser. E for evolution:  The spot keeps  changing in size, shape, and color.  Please check your skin once per month between visits. You can use a small mirror in front and a large mirror behind you to keep an eye on the back side or your body.   If you see any new or changing lesions before your next follow-up, please call to schedule a visit.  Please continue daily skin protection including broad spectrum sunscreen SPF 30+ to sun-exposed areas, reapplying every 2 hours as needed when you're outdoors.   Staying in the shade or wearing long sleeves, sun glasses (UVA+UVB protection) and wide brim hats (4-inch brim around the entire circumference of the hat) are also recommended for sun protection.    Due to recent changes in healthcare laws, you may see results of your pathology and/or laboratory studies on MyChart before the doctors have had a chance to review them. We understand that in some cases there may be results that are confusing or concerning to you. Please understand that not all results are received at the same time and often the doctors may need to interpret multiple results in order to provide you with the best plan of care or course of treatment. Therefore, we ask that you please give u[place]2 business days to thoroughly review all your results before contacting the office for clarification. Should we see a critical lab result, you will be contacted sooner.   If You Need Anything After Your Visit  If you have any questions  or concerns for your doctor, please call our main line at [phone] and press option 4 to reach your doctor's medical assistant. If no one answers, please leave a voicemail as directed and we will return your call as soon as possible. Messages left after 4 pm will be answered the following business day.   You may also send [place] a message via [place]. We typically respond to MyChart messages within 1-2 business days.  For prescription refills, please ask your pharmacy to contact our office. Our fax number is  [phone].  If you have an urgent issue when the clinic is closed that cannot wait until the next business day, you can page your doctor at the number below.    Please note that while we do our best to be available for urgent issues outside of office hours, we are not available 24/7.   If you have an urgent issue and are unable to reach [place], you may choose to seek medical care at your doctor's office, retail clinic, urgent care center, or emergency room.  If you have a medical emergency, please immediately call 911 or go to the emergency department.  Pager Numbers  - Dr. Nehemiah Massed: [phone]  - Dr. Laurence Ferrari: [phone]  - Dr. Nicole Kindred: [phone]  In the event of inclement weather, please call our main line at [phone] for an update on the status of any delays or closures.  Dermatology Medication Tips: Please keep the boxes that topical medications come in in order to help keep track of the instructions about where and how to use these. Pharmacies typically print the medication instructions only on the boxes and not directly on the medication tubes.   If your medication is too expensive, please contact our office at [phone] option 4 or send [place] a message through [place].   We are unable to tell what your co-pay for medications will be in advance as this is different depending on your insurance coverage. However, we may be able to find a substitute medication at lower cost or fill out paperwork to get insurance to cover a needed medication.   If a prior authorization is required to get your medication covered by your insurance company, please allow [place] 1-2 business days to complete this process.  Drug prices often vary depending on where the prescription is filled and some pharmacies may offer cheaper prices.  The website www.goodrx.com contains coupons for medications through different pharmacies. The prices here do not account for what the cost may be with help from  insurance (it may be cheaper with your insurance), but the website can give you the price if you did not use any insurance.  - You can print the associated coupon and take it with your prescription to the pharmacy.  - You may also stop by our office during regular business hours and pick up a GoodRx coupon card.  - If you need your prescription sent electronically to a different pharmacy, notify our office through [facility] or by phone at [phone] option 4.     Si Usted Necesita Algo Despus de Su Visita  Tambin puede enviarnos un mensaje a travs de Pharmacist, community. Por lo general respondemos a los mensajes de MyChart en el transcurso de 1 a 2 das hbiles.  Para renovar recetas, por favor pida a su farmacia que se ponga en contacto con nuestra oficina. Harland Dingwall de fax es [place] [phone].  Si tiene un asunto urgente cuando la clnica est cerrada y que no puede  esperar [place] hbil, puede llamar/localizar a su doctor(a) al nmero que aparece a continuacin.   Por favor, tenga en cuenta que aunque hacemos todo lo posible para estar disponibles para asuntos urgentes fuera del horario de [place], no estamos disponibles las 24 horas del da, los 7 das de la [place].   Si tiene un problema urgente y no puede comunicarse con nosotros, puede optar por buscar atencin mdica  en el consultorio de su doctor(a), en una clnica privada, en un centro de atencin urgente o en una sala de emergencias.  Si tiene Engineering geologist, por favor llame inmediatamente al 911 o vaya a la sala de emergencias.  Nmeros de bper  - Dr. Nehemiah Massed: [phone]  - Dra. Moye: [phone]  - Dra. Nicole Kindred: [phone]  En caso de inclemencias del [place], por favor llame a Johnsie Kindred principal al [phone] para una actualizacin sobre el [place] de cualquier retraso o cierre.  Consejos para la medicacin en dermatologa: Por favor, guarde las cajas en las que vienen los  medicamentos de uso tpico para ayudarle a seguir las instrucciones sobre dnde y cmo usarlos. Las farmacias generalmente imprimen las instrucciones del medicamento slo en las cajas y no directamente en los tubos del [place].   Si su medicamento es muy caro, por favor, pngase en contacto con Zigmund Daniel llamando al [phone] y presione la opcin 4 o envenos un mensaje a travs de Pharmacist, community.   No podemos decirle cul ser su copago por los medicamentos por adelantado ya que esto es diferente dependiendo de la cobertura de su seguro. Sin embargo, es posible que podamos encontrar un medicamento sustituto a Electrical engineer un formulario para que el seguro cubra el medicamento que se considera necesario.   Si se requiere una autorizacin previa para que su compaa de seguros [place] su medicamento, por favor permtanos de 1 a 2 das hbiles para completar este proceso.  Los precios de los medicamentos varan con frecuencia dependiendo del Environmental consultant de dnde se surte la receta y alguna farmacias pueden ofrecer precios ms baratos.  El sitio web www.goodrx.com tiene cupones para medicamentos de Airline pilot. Los precios aqu no tienen en cuenta lo que podra costar con la ayuda del seguro (puede ser ms barato con su seguro), pero el sitio web puede darle el precio si no utiliz Research scientist (physical sciences).  - Puede imprimir el cupn correspondiente y llevarlo con su receta a la farmacia.  - Tambin puede pasar por nuestra oficina durante el horario de atencin regular y Charity fundraiser una tarjeta de cupones de GoodRx.  - Si necesita que su receta se enve electrnicamente a una farmacia diferente, informe a nuestra oficina a travs de MyChart de [place] o por telfono llamando al [phone] y presione la opcin 4.

## 2022-12-28 NOTE — Progress Notes (Signed)
Follow-Up Visit   Subjective  Patricia [name] is a 72 y.o. female who presents for the following: Annual Exam (6 month tbse, hx of aks, hx of bcc, hx of sccis).  The patient presents for Total-Body Skin Exam (TBSE) for skin cancer screening and mole check.  The patient has spots, moles and lesions to be evaluated, some may be new or changing and the patient has concerns that these could be cancer.   The following portions of the chart were reviewed this encounter and updated as appropriate:  Tobacco  Allergies  Meds  Problems  Med Hx  Surg Hx  Fam Hx      Review of Systems: No other skin or systemic complaints except as noted in HPI or Assessment and Plan.   Objective  Well appearing patient in no apparent distress; mood and affect are within normal limits.  A full examination was performed including scalp, head, eyes, ears, nose, lips, neck, chest, axillae, abdomen, back, buttocks, bilateral upper extremities, bilateral lower extremities, hands, feet, fingers, toes, fingernails, and toenails. All findings within normal limits unless otherwise noted below.  lower mid back x 1, right lateral popliteal fossa x 1, right axilla x 1, right lower abdomen x 1 (4) Erythematous thin papules/macules with gritty scale.    Assessment & Plan  Actinic keratosis (4) lower mid back x 1, right lateral popliteal fossa x 1, right axilla x 1, right lower abdomen x 1  Favor early AK at right zygoma. Recommend daily sunscreen, monitor for changes and will recheck at next follow up.   Actinic keratoses are precancerous spots that appear secondary to cumulative UV radiation exposure/sun exposure over time. They are chronic with expected duration over 1 year. A portion of actinic keratoses will progress to squamous cell carcinoma of the skin. It is not possible to reliably predict which spots will progress to skin cancer and so treatment is recommended to prevent development of skin  cancer.  Recommend daily broad spectrum sunscreen SPF 30+ to sun-exposed areas, reapply every 2 hours as needed.   Recommend staying in the shade or wearing long sleeves, sun glasses (UVA+UVB protection) and wide brim hats (4-inch brim around the entire circumference of the hat). Call for new or changing lesions.  Destruction of lesion - lower mid back x 1, right lateral popliteal fossa x 1, right axilla x 1, right lower abdomen x 1  Destruction method: cryotherapy   Informed consent: discussed and consent obtained   Lesion destroyed using liquid nitrogen: Yes   Cryotherapy cycles:  2 Outcome: patient tolerated procedure well with no complications   Post-procedure details: wound care instructions given   Additional details:  Prior to procedure, discussed risks of blister formation, small wound, skin dyspigmentation, or rare scar following cryotherapy. Recommend Vaseline ointment to treated areas while healing.   Lentigines - Scattered tan macules - Due to sun exposure - Benign-appearing, observe - Recommend daily broad spectrum sunscreen SPF 30+ to sun-exposed areas, reapply every 2 hours as needed. - Call for any changes  Seborrheic Keratoses - Stuck-on, waxy, tan-brown papules and/or plaques  - Benign-appearing - Discussed benign etiology and prognosis. - Observe - Call for any changes  Melanocytic Nevi - Tan-brown and/or pink-flesh-colored symmetric macules and papules - Benign appearing on exam today - Observation - Call clinic for new or changing moles - Recommend daily use of broad spectrum spf 30+ sunscreen to sun-exposed areas.   Hemangiomas - Red papules - Discussed benign nature - Observe -  Call for any changes  Varicose Veins/Spider Veins - Dilated blue, purple or red veins at the lower extremities - Reassured - Smaller vessels can be treated by sclerotherapy (a procedure to inject a medicine into the veins to make them disappear) if desired, but the  treatment is not covered by insurance. Larger vessels may be covered if symptomatic and we would refer to vascular surgeon if treatment desired.   Actinic Damage - Chronic condition, secondary to cumulative UV/sun exposure - diffuse scaly erythematous macules with underlying dyspigmentation - Recommend daily broad spectrum sunscreen SPF 30+ to sun-exposed areas, reapply every 2 hours as needed.  - Staying in the shade or wearing long sleeves, sun glasses (UVA+UVB protection) and wide brim hats (4-inch brim around the entire circumference of the hat) are also recommended for sun protection.  - Call for new or changing lesions.  History of Basal Cell Carcinoma of the Skin Right lateral forehead, left buttock ED&C 08/10/22 and other sites see history  - No evidence of recurrence today - Recommend regular full body skin exams - Recommend daily broad spectrum sunscreen SPF 30+ to sun-exposed areas, reapply every 2 hours as needed.  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  - Call if any new or changing lesions are noted between office visits   Skin cancer screening performed today. Return in about 6 months (around 06/28/2023) for tbse .  I, Ruthell Rummage, CMA, am acting as scribe for Forest Gleason, MD.  Documentation: I have reviewed the above documentation for accuracy and completeness, and I agree with the above.  Forest Gleason, MD

## 2023-01-14 ENCOUNTER — Other Ambulatory Visit: Payer: Self-pay | Admitting: Family Medicine

## 2023-01-20 NOTE — Progress Notes (Signed)
Remote pacemaker transmission.   

## 2023-02-09 ENCOUNTER — Telehealth: Payer: Self-pay | Admitting: *Deleted

## 2023-02-09 NOTE — Telephone Encounter (Signed)
Pt has been scheduled for tele pre op appt 02/17/23 @ 10:20. Med rec and consent are done.

## 2023-02-09 NOTE — Telephone Encounter (Signed)
Pt has been scheduled for tele pre op appt 02/17/23 @ 10:20. Med rec and consent are done.      Patient Consent for Virtual Visit        Patricia [name] has provided verbal consent on 02/09/2023 for a virtual visit (video or telephone).   CONSENT FOR VIRTUAL VISIT FOR:  Patricia [name]  By participating in this virtual visit I agree to the following:  I hereby voluntarily request, consent and authorize [facility] and its employed or contracted physicians, physician assistants, nurse practitioners or other licensed health care professionals (the Practitioner), to provide me with telemedicine health care services (the "Services") as deemed necessary by the treating Practitioner. I acknowledge and consent to receive the Services by the Practitioner via telemedicine. I understand that the telemedicine visit will involve communicating with the Practitioner through live audiovisual communication technology and the disclosure of certain medical information by electronic transmission. I acknowledge that I have been given the opportunity to request an in-person assessment or other available alternative prior to the telemedicine visit and am voluntarily participating in the telemedicine visit.  I understand that I have the right to withhold or withdraw my consent to the use of telemedicine in the course of my care at any time, without affecting my right to future care or treatment, and that the Practitioner or I may terminate the telemedicine visit at any time. I understand that I have the right to inspect all information obtained and/or recorded in the course of the telemedicine visit and may receive copies of available information for a reasonable fee.  I understand that some of the potential risks of receiving the Services via telemedicine include:  Delay or interruption in medical evaluation due to technological equipment failure or disruption; Information transmitted may not be sufficient  (e.g. poor resolution of images) to allow for appropriate medical decision making by the Practitioner; and/or  In rare instances, security protocols could fail, causing a breach of personal health information.  Furthermore, I acknowledge that it is my responsibility to provide information about my medical history, conditions and care that is complete and accurate to the best of my ability. I acknowledge that Practitioner's advice, recommendations, and/or decision may be based on factors not within their control, such as incomplete or inaccurate data provided by me or distortions of diagnostic images or specimens that may result from electronic transmissions. I understand that the practice of medicine is not an exact science and that Practitioner makes no warranties or guarantees regarding treatment outcomes. I acknowledge that a copy of this consent can be made available to me via my patient portal ([facility]), or I can request a printed copy by calling the office of [facility].    I understand that my insurance will be billed for this visit.   I have read or had this consent read to me. I understand the contents of this consent, which adequately explains the benefits and risks of the Services being provided via telemedicine.  I have been provided ample opportunity to ask questions regarding this consent and the Services and have had my questions answered to my satisfaction. I give my informed consent for the services to be provided through the use of telemedicine in my medical care

## 2023-02-09 NOTE — Telephone Encounter (Signed)
   Name: Patricia [name]  DOB: 1951/03/29  MRN: [id number]  Primary Cardiologist: Minus Breeding, MD  Chart reviewed as part of pre-operative protocol coverage. Because of [name]'s past medical history and time since last visit, she will require a follow-up telephone visit in order to better assess preoperative cardiovascular risk.  Pre-op covering staff: - Please schedule appointment and call patient to inform them. If patient already had an upcoming appointment within acceptable timeframe, please add "pre-op clearance" to the appointment notes so provider is aware. - Please contact requesting surgeon's office via preferred method (i.e, phone, fax) to inform them of need for appointment prior to surgery.   Elgie Collard, PA-C  02/09/2023, 4:36 PM

## 2023-02-09 NOTE — Telephone Encounter (Signed)
   Pre-operative Risk Assessment    Patient Name: Patricia [name]  DOB: May 07, 1951 MRN: [id number]   Request for Surgical Clearance    Procedure:   COLONOSCOPY  { { Surgeon:  DR Collene Mares   SCHEDULED   02-27-23   Surgeon's Group or Practice Name:  [facility]   Phone number:  [phone]  Fax number:  [phone] { Type of Clearance Requested:    - Medical  { Type of Anesthesia:   PROPOFOL    Additional requests/questions:   N/A  Kathleen Argue   02/09/2023, 3:33 PM

## 2023-02-15 NOTE — Progress Notes (Signed)
Virtual Visit via Telephone Note   Because of Patricia [name]'s co-morbid illnesses, she is at least at moderate risk for complications without adequate follow up.  This format is felt to be most appropriate for this patient at this time.  The patient did not have access to video technology/had technical difficulties with video requiring transitioning to audio format only (telephone).  All issues noted in this document were discussed and addressed.  No physical exam could be performed with this format.  Please refer to the patient's chart for her consent to telehealth for [facility].  Evaluation Performed:  Preoperative cardiovascular risk assessment _____________   Date:  02/15/2023   Patient ID:  Patricia [name], DOB [birth year], MRN [id number] Patient Location:  Home Provider location:   Office  Primary Care Provider:  Susy Frizzle, MD Primary Cardiologist:  Minus Breeding, MD  Chief Complaint / Patient Profile   72 y.o. 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  History of Present Illness    Patricia [name] is a 72 y.o. female who presents via audio/video conferencing for a telehealth visit today.  Pt was last seen in cardiology clinic on 10/21/22 by Dr. Percival Spanish.  At that time CHRISTYE VIERLING was doing well.  The patient is now pending procedure as outlined above. Since her last visit, she denies chest pain, shortness of breath, lower extremity edema, fatigue,  melena, hematuria, hemoptysis, diaphoresis, weakness, presyncope, syncope, orthopnea, and PND. Has occasional palpitations that are brief and not concerning to her. She rides an elliptical bike, walks, and housework to achieve > 4 METS without  concerning cardiac symptoms.    Past Medical History    Past Medical History:  Diagnosis Date   Atrial flutter ([place])    Ablated 1998 Dr. Caryl Comes   Basal cell carcinoma 06/05/2019   R low back, [facility] 07/16/19   Basal cell carcinoma 04/15/2010   Right post shoulder   Basal cell carcinoma 04/15/2010   Mid back   Basal cell carcinoma (BCC) 09/29/2016   Left post shoulder. Superficial and nodular.   BCC (basal cell carcinoma) 07/21/2021   right mid back, [facility] 09/15/2021   BCC (basal cell carcinoma) 01/27/2022   left lower abdomen, [facility] 07/28/22   BCC (basal cell carcinoma) 07/28/2022   right lateral forehead, [facility] 08/10/22   BCC (basal cell carcinoma) 07/28/2022   left buttock, [facility] 08/10/22   Bleeding gastric ulcer    back in 2000   Breast cancer ([place])    COPD (chronic obstructive pulmonary disease) ([place])    from radiation   GERD (gastroesophageal reflux disease)    History of basal cell carcinoma (BCC) 03/22/2021   right neck and right medial ear, Moh's   Hodgkin's lymphoma ([place])    Treated with radiation and chemo   Mitral regurgitation    pvc   Osteoporosis    lumbar verterbral fracture, left ankle fracture, dexa 2015   Pancreatitis    Pneumonia    Prediabetes    Presence of permanent cardiac pacemaker    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 09/13/2017   25 mm Medtronic Mosaic porcine stented bioprosthetic tissue valve   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   Left medial lower leg   Squamous  cell carcinoma in situ (SCCIS) 07/28/2022   right anterior thigh, [facility] 08/10/22   Ulcer    Past Surgical History:  Procedure Laterality Date   AORTIC VALVE REPLACEMENT N/A 04/07/2022   Procedure: AORTIC VALVE REPLACEMENT (AVR), USING INSPIRIS 19 MM AORTIC VALVE;  Surgeon: [name], MD;  Location: [facility];  Service: Open Heart Surgery;  Laterality: N/A;   BREAST SURGERY     breast cancer since 2005   left mastectomy   BUBBLE STUDY  01/12/2022    Procedure: BUBBLE STUDY;  Surgeon: Geralynn Rile, MD;  Location: [place];  Service: Cardiovascular;;   CARDIAC ELECTROPHYSIOLOGY [place] REPLACEMENT N/A    [employer]reesia 03/19/2021   CATARACT EXTRACTION, BILATERAL     COLONOSCOPY     IR RADIOLOGY PERIPHERAL GUIDED IV START  08/03/2017   IR THORACENTESIS ASP PLEURAL SPACE W/IMG GUIDE  05/23/2022   IR THORACENTESIS ASP PLEURAL SPACE W/IMG GUIDE  05/24/2022   IR US GUIDE VASC ACCESS RIGHT  08/03/2017   LAPAROTOMY     MASTECTOMY  2005   Left-axillary   MITRAL VALVE REPAIR Right 09/13/2017   Procedure: MINIMALLY INVASIVE MITRAL VALVE REPLACEMENT (MVR) with Medtronic Mosaic Porcine Heart Valve size 72m;  Surgeon: ORexene Alberts MD;  [place] [facility]  Service: Open Heart Surgery;  Laterality: Right;   ORIF ANKLE FRACTURE Left 03/21/2014   Procedure: LEFT ANKLE FRACTURE OPEN TREATMENT BILMALLEOLAR INCLUDES INTERNAL FIXATION ;  Surgeon: TRenette Butters MD;  [place] [facility]  Service: Orthopedics;  Laterality: Left;   PACEMAKER IMPLANT N/A 09/19/2017   Medtronic Azure XT MRI conditional dual-chamber pacemaker for symptomatic complete heart block  by Dr ARayann Heman  RIGHT/LEFT HEART CATH AND CORONARY ANGIOGRAPHY N/A 06/27/2017   Procedure: Right/Left Heart Cath and Coronary Angiography;  Surgeon: MLarey [name] MD;  [facility];  Service: Cardiovascular;  Laterality: N/A;   SPLENECTOMY  1974   hodgkins disease   TEE WITHOUT CARDIOVERSION  06/12/2012   Procedure: TRANSESOPHAGEAL ECHOCARDIOGRAM (TEE);  Surgeon: DLarey [name] MD;  [place] [facility]  Service: Cardiovascular;  Laterality: N/A;   TEE WITHOUT CARDIOVERSION N/A 06/29/2017   Procedure: TRANSESOPHAGEAL ECHOCARDIOGRAM (TEE);  Surgeon: SJerline Pain MD;  [facility];  Service: Cardiovascular;  Laterality: N/A;   TEE WITHOUT CARDIOVERSION N/A 09/13/2017   Procedure: TRANSESOPHAGEAL ECHOCARDIOGRAM  (TEE);  Surgeon: ORexene Alberts MD;  [place] [facility]  Service: Open Heart Surgery;  Laterality: N/A;   TEE WITHOUT CARDIOVERSION N/A 01/12/2022   Procedure: TRANSESOPHAGEAL ECHOCARDIOGRAM (TEE);  Surgeon: OGeralynn Rile MD;  [place] [facility]  Service: Cardiovascular;  Laterality: N/A;   TEE WITHOUT CARDIOVERSION N/A 04/07/2022   Procedure: TRANSESOPHAGEAL ECHOCARDIOGRAM (TEE);  Surgeon: BGaye Pollack MD;  [facility]  Service: Open Heart Surgery;  Laterality: N/A;   TONSILLECTOMY AND ADENOIDECTOMY      Allergies  Allergies  Allergen Reactions   Aspirin Other (See Comments)    Past bleeding ulcer. GI upset   Azithromycin Other (See Comments)    pancreatitis   Penicillins Anaphylaxis, Hives, Swelling and Other (See Comments)    PATIENT HAS HAD A PCN REACTION WITH IMMEDIATE RASH, FACIAL/TONGUE/THROAT SWELLING, SOB, OR LIGHTHEADEDNESS WITH HYPOTENSION:  #  #  #  YES  #  #  #   HAS PT DEVELOPED SEVERE RASH INVOLVING MUCUS MEMBRANES or SKIN NECROSIS: #  #  #  YES  #  #  #  Has patient had a PCN reaction that required hospitalization: No Has patient had a PCN reaction occurring within the last 10 years: No    Sulfa Antibiotics Hives and Rash   Cheese Nausea And Vomiting    Home Medications    Prior to Admission medications   Medication Sig Start Date End Date Taking? Authorizing Provider  acetaminophen (TYLENOL) 325 MG tablet Take 2 tablets (650 mg total) by mouth 2 (two) times daily as needed for moderate pain or headache. 09/21/17   Nani Skillern, PA-C  alendronate (FOSAMAX) 70 MG tablet Take 1 tablet (70 mg total) by mouth once a week. 12/28/22   Susy Frizzle, MD  atorvastatin (LIPITOR) 20 MG tablet Take 1 tablet (20 mg total) by mouth daily. 10/07/22   Minus Breeding, MD  Blood Glucose Monitoring Suppl (ACCU-CHEK GUIDE) w/Device KIT 1 Units by Does not apply route daily. 12/25/19   Susy Frizzle, MD  Calcium Carbonate (CALCIUM 600 PO) Take 600 mg  by mouth 2 (two) times daily. Lunch and Higher education careers adviser, Historical, MD  chlorthalidone (HYGROTON) 25 MG tablet TAKE 1 TABLET BY MOUTH ONCE A DAY 11/23/22   Evans Lance, MD  CREON [phone] units CPEP capsule Take 36,000 Units by mouth 3 (three) times daily before meals. 12/23/21   [provider]  esomeprazole (NEXIUM) 40 MG capsule Take 40 mg by mouth daily before breakfast.  07/02/15   [provider]  fluticasone (FLONASE) 50 MCG/ACT nasal spray Place 2 sprays into both nostrils daily as needed for rhinitis. 12/15/22   Susy Frizzle, MD  glucose blood (ACCU-CHEK GUIDE) test strip USE TO TEST BLOOD SUGARS EVERY MORNING 01/16/23   Susy Frizzle, MD  Lancets (ACCU-CHEK SOFT TOUCH) lancets Check BS QAM 07/08/20   Susy Frizzle, MD  Lancets Misc. (ACCU-CHEK SOFTCLIX LANCET DEV) KIT Check BS QAM 12/25/19   Susy Frizzle, MD  levothyroxine (SYNTHROID) 50 MCG tablet TAKE 1 TABLET BY MOUTH ONCE A DAY Patient taking differently: Take 50 mcg by mouth daily before breakfast. 03/17/22   Susy Frizzle, MD  metoprolol tartrate (LOPRESSOR) 100 MG tablet Take 1 tablet (100 mg total) by mouth 2 (two) times daily. 07/21/22   Minus Breeding, MD  Multiple Vitamin (MULTIVITAMIN WITH MINERALS) TABS tablet Take 1 tablet by mouth daily with lunch. Centrum Silver.    [provider]  potassium chloride SA (KLOR-CON M) 20 MEQ tablet TAKE 1 TABLET BY MOUTH ONCE A DAY 06/09/22   Minus Breeding, MD  torsemide (DEMADEX) 10 MG tablet TAKE 2 TABLETS BY MOUTH ONCE A DAY 11/08/22   Minus Breeding, MD    Physical Exam    Vital Signs:  SHANDIA PORRATA does not have vital signs available for review today.  Given telephonic nature of communication, physical exam is limited. AAOx3. NAD. Normal affect.  Speech and respirations are unlabored.  Accessory Clinical Findings    None  Assessment & Plan    1.  Preoperative Cardiovascular Risk Assessment:The patient is doing well  from a cardiac perspective. Therefore, based on ACC/AHA guidelines, the patient would be at acceptable risk for the planned procedure without further cardiovascular testing. According to the Revised Cardiac Risk Index (RCRI), her Perioperative Risk of Major Cardiac Event is (%): 0.9. Her Functional Capacity in METs is: 6.61 according to the [employer] Activity Status Index (DASI).  The patient was advised that if she develops new symptoms prior to surgery to contact our office to  arrange for a follow-up visit, and she verbalized understanding.  Needs f/u appointment A copy of this note will be routed to requesting surgeon.  Time:   Today, I have spent 8 minutes with the patient with telehealth technology discussing medical history, symptoms, and management plan.     Emmaline Life, NP-C  02/17/2023, 10:16 AM [address] Office [phone] Fax [phone]

## 2023-02-17 ENCOUNTER — Ambulatory Visit: Payer: Medicare PPO | Attending: Internal Medicine | Admitting: Nurse Practitioner

## 2023-02-17 ENCOUNTER — Encounter: Payer: Self-pay | Admitting: Nurse Practitioner

## 2023-02-17 DIAGNOSIS — Z01810 Encounter for preprocedural cardiovascular examination: Secondary | ICD-10-CM | POA: Diagnosis not present

## 2023-02-27 LAB — HM COLONOSCOPY

## 2023-03-02 ENCOUNTER — Ambulatory Visit: Payer: Medicare PPO | Admitting: Family Medicine

## 2023-03-02 VITALS — BP 112/62 | HR 88 | Temp 98.0°F | Ht 65.0 in | Wt 107.0 lb

## 2023-03-02 DIAGNOSIS — J069 Acute upper respiratory infection, unspecified: Secondary | ICD-10-CM

## 2023-03-02 MED ORDER — LEVOFLOXACIN 500 MG PO TABS: 500.0000 mg | ORAL_TABLET | Freq: Every day | ORAL | 0 refills | Status: AC

## 2023-03-02 NOTE — Progress Notes (Signed)
Subjective:    Patient ID: [name], female    DOB: 09/10/1951, 72 y.o.   MRN: [id number]  Patient reports a 2-day history of runny nose, sinus pressure, sinus headache, sore throat, and a dry nonproductive cough.  She had a low-grade fever yesterday of 99.3.  Her COVID test was negative.  She denies any shortness of breath or chest pain. Past Medical History:  Diagnosis Date   Atrial flutter ([place])    Ablated 1998 Dr. Caryl Comes   Basal cell carcinoma 06/05/2019   R low back, [facility] 07/16/19   Basal cell carcinoma 04/15/2010   Right post shoulder   Basal cell carcinoma 04/15/2010   Mid back   Basal cell carcinoma (BCC) 09/29/2016   Left post shoulder. Superficial and nodular.   BCC (basal cell carcinoma) 07/21/2021   right mid back, [facility] 09/15/2021   BCC (basal cell carcinoma) 01/27/2022   left lower abdomen, [facility] 07/28/22   BCC (basal cell carcinoma) 07/28/2022   right lateral forehead, [facility] 08/10/22   BCC (basal cell carcinoma) 07/28/2022   left buttock, [facility] 08/10/22   Bleeding gastric ulcer    back in 2000   Breast cancer ([place])    COPD (chronic obstructive pulmonary disease) ([place])    from radiation   GERD (gastroesophageal reflux disease)    History of basal cell carcinoma (BCC) 03/22/2021   right neck and right medial ear, Moh's   Hodgkin's lymphoma ([place])    Treated with radiation and chemo   Mitral regurgitation    pvc   Osteoporosis    lumbar verterbral fracture, left ankle fracture, dexa 2015   Pancreatitis    Pneumonia    Prediabetes    Presence of permanent cardiac pacemaker    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 09/13/2017   25 mm Medtronic Mosaic porcine stented bioprosthetic tissue valve   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   Left medial lower leg   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   right anterior thigh, [facility] 08/10/22   Ulcer    Past Surgical History:  Procedure Laterality Date   AORTIC VALVE  REPLACEMENT N/A 04/07/2022   Procedure: AORTIC VALVE REPLACEMENT (AVR), USING INSPIRIS 19 MM AORTIC VALVE;  Surgeon: [name], MD;  Location: [name];  Service: Open Heart Surgery;  Laterality: N/A;   BREAST SURGERY     breast cancer since 2005   left mastectomy   BUBBLE STUDY  01/12/2022   Procedure: BUBBLE STUDY;  Surgeon: Geralynn Rile, MD;  Location: [place];  Service: Cardiovascular;;   CARDIAC ELECTROPHYSIOLOGY [place] REPLACEMENT N/A    [employer] 03/19/2021   CATARACT EXTRACTION, BILATERAL     COLONOSCOPY     IR RADIOLOGY PERIPHERAL GUIDED IV START  08/03/2017   IR THORACENTESIS ASP PLEURAL SPACE W/IMG GUIDE  05/23/2022   IR THORACENTESIS ASP PLEURAL SPACE W/IMG GUIDE  05/24/2022   IR US GUIDE VASC ACCESS RIGHT  08/03/2017   LAPAROTOMY     MASTECTOMY  2005   Left-axillary   MITRAL VALVE REPAIR Right 09/13/2017   Procedure: MINIMALLY INVASIVE MITRAL VALVE REPLACEMENT (MVR) with Medtronic Mosaic Porcine Heart Valve size 43mm;  Surgeon: Rexene Alberts, MD;  Location: [place];  Service: Open Heart Surgery;  Laterality: Right;   ORIF ANKLE FRACTURE Left 03/21/2014   Procedure: LEFT ANKLE FRACTURE OPEN TREATMENT BILMALLEOLAR INCLUDES INTERNAL FIXATION ;  Surgeon: Renette Butters, MD;  Location: [place];  Service: Orthopedics;  Laterality: Left;   PACEMAKER IMPLANT N/A 09/19/2017   Medtronic Azure XT MRI conditional dual-chamber pacemaker for symptomatic complete heart block  by Dr Rayann Heman   RIGHT/LEFT HEART CATH AND CORONARY ANGIOGRAPHY N/A 06/27/2017   Procedure: Right/Left Heart Cath and Coronary Angiography;  Surgeon: Larey [name], MD;  Location: [place] [facility];  Service: Cardiovascular;  Laterality: N/A;   SPLENECTOMY  1974   hodgkins disease   TEE WITHOUT CARDIOVERSION  06/12/2012   Procedure: TRANSESOPHAGEAL ECHOCARDIOGRAM (TEE);  Surgeon: Larey [name], MD;  Location: [place];  Service: Cardiovascular;   Laterality: N/A;   TEE WITHOUT CARDIOVERSION N/A 06/29/2017   Procedure: TRANSESOPHAGEAL ECHOCARDIOGRAM (TEE);  Surgeon: Jerline Pain, MD;  Location: [facility] ENDOSCOPY;  Service: Cardiovascular;  Laterality: N/A;   TEE WITHOUT CARDIOVERSION N/A 09/13/2017   Procedure: TRANSESOPHAGEAL ECHOCARDIOGRAM (TEE);  Surgeon: Rexene Alberts, MD;  Location: [place];  Service: Open Heart Surgery;  Laterality: N/A;   TEE WITHOUT CARDIOVERSION N/A 01/12/2022   Procedure: TRANSESOPHAGEAL ECHOCARDIOGRAM (TEE);  Surgeon: Geralynn Rile, MD;  Location: [place];  Service: Cardiovascular;  Laterality: N/A;   TEE WITHOUT CARDIOVERSION N/A 04/07/2022   Procedure: TRANSESOPHAGEAL ECHOCARDIOGRAM (TEE);  Surgeon: [name], MD;  Location: [place];  Service: Open Heart Surgery;  Laterality: N/A;   TONSILLECTOMY AND ADENOIDECTOMY     Current Outpatient Medications on File Prior to Visit  Medication Sig Dispense Refill   acetaminophen (TYLENOL) 325 MG tablet Take 2 tablets (650 mg total) by mouth 2 (two) times daily as needed for moderate pain or headache.     alendronate (FOSAMAX) 70 MG tablet Take 1 tablet (70 mg total) by mouth once a week. 12 tablet 3   atorvastatin (LIPITOR) 20 MG tablet Take 1 tablet (20 mg total) by mouth daily. 30 tablet 6   Blood Glucose Monitoring Suppl (ACCU-CHEK GUIDE) w/Device KIT 1 Units by Does not apply route daily. 1 kit 0   Calcium Carbonate (CALCIUM 600 PO) Take 600 mg by mouth 2 (two) times daily. Lunch and Dinner     chlorthalidone (HYGROTON) 25 MG tablet TAKE 1 TABLET BY MOUTH ONCE A DAY 90 tablet 2   CREON 36000-114000 units CPEP capsule Take 36,000 Units by mouth 3 (three) times daily before meals.     esomeprazole (NEXIUM) 40 MG capsule Take 40 mg by mouth daily before breakfast.   12   fluticasone (FLONASE) 50 MCG/ACT nasal spray Place 2 sprays into both nostrils daily as needed for rhinitis. 16 g 1   glucose blood (ACCU-CHEK GUIDE) test strip USE TO TEST BLOOD SUGARS  EVERY MORNING 100 each 0   Lancets (ACCU-CHEK SOFT TOUCH) lancets Check BS QAM 100 each 3   Lancets Misc. (ACCU-CHEK SOFTCLIX LANCET DEV) KIT Check BS QAM 1 kit 0   levothyroxine (SYNTHROID) 50 MCG tablet TAKE 1 TABLET BY MOUTH ONCE A DAY (Patient taking differently: Take 50 mcg by mouth daily before breakfast.) 90 tablet 3   metoprolol tartrate (LOPRESSOR) 100 MG tablet Take 1 tablet (100 mg total) by mouth 2 (two) times daily. 180 tablet 3   Multiple Vitamin (MULTIVITAMIN WITH MINERALS) TABS tablet Take 1 tablet by mouth daily with lunch. Centrum Silver.     potassium chloride SA (KLOR-CON M) 20 MEQ tablet TAKE 1 TABLET BY MOUTH ONCE A DAY 30 tablet 10   torsemide (DEMADEX) 10 MG tablet TAKE 2 TABLETS BY MOUTH ONCE A DAY 120 tablet 3  Current Facility-Administered Medications on File Prior to Visit  Medication Dose Route Frequency Provider Last Rate Last Admin   torsemide (DEMADEX) tablet 60 mg  60 mg Oral Daily Bartle, Fernande Boyden, MD         Allergies  Allergen Reactions   Aspirin Other (See Comments)    Past bleeding ulcer. GI upset   Azithromycin Other (See Comments)    pancreatitis   Penicillins Anaphylaxis, Hives, Swelling and Other (See Comments)    PATIENT HAS HAD A PCN REACTION WITH IMMEDIATE RASH, FACIAL/TONGUE/THROAT SWELLING, SOB, OR LIGHTHEADEDNESS WITH HYPOTENSION:  #  #  #  YES  #  #  #   HAS PT DEVELOPED SEVERE RASH INVOLVING MUCUS MEMBRANES or SKIN NECROSIS: #  #  #  YES  #  #  #  Has patient had a PCN reaction that required hospitalization: No Has patient had a PCN reaction occurring within the last 10 years: No    Sulfa Antibiotics Hives and Rash   Cheese Nausea And Vomiting   Social History   Socioeconomic History   Marital status: Widowed    Spouse name: Not on file   Number of children: Not on file   Years of education: Not on file   Highest education level: Master's degree (e.g., MA, MS, MEng, MEd, MSW, MBA)  Occupational History   Occupation: Pharmacist, hospital  (retired)  Tobacco Use   Smoking status: Never   Smokeless tobacco: Never  Vaping Use   Vaping Use: Never used  Substance and Sexual Activity   Alcohol use: No   Drug use: No   Sexual activity: Yes  Other Topics Concern   Not on file  Social History Narrative   Husband passed away from [place] in 2017/09/14.   Social Determinants of Health   Financial Resource Strain: Low Risk  (03/02/2023)   Overall Financial Resource Strain (CARDIA)    Difficulty of Paying Living Expenses: Not hard at all  Food Insecurity: No Food Insecurity (03/02/2023)   Hunger Vital Sign    Worried About Running Out of Food in the Last Year: Never true    Ran Out of Food in the Last Year: Never true  Transportation Needs: No Transportation Needs (03/02/2023)   PRAPARE - Hydrologist (Medical): No    Lack of Transportation (Non-Medical): No  Physical Activity: Insufficiently Active (03/02/2023)   Exercise Vital Sign    Days of Exercise per Week: 5 days    Minutes of Exercise per Session: 20 min  Stress: No Stress Concern Present (03/02/2023)   [place]    Feeling of Stress : Only a little  Social Connections: Moderately Integrated (03/02/2023)   Social Connection and Isolation Panel [NHANES]    Frequency of Communication with Friends and Family: Twice a week    Frequency of Social Gatherings with Friends and Family: Once a week    Attends Religious Services: More than 4 times per year    Active Member of [employer] or Organizations: Yes    Attends Archivist Meetings: More than 4 times per year    Marital Status: Widowed  Intimate Partner Violence: Not At Risk (12/06/2022)   Humiliation, Afraid, Rape, and Kick questionnaire    Fear of Current or Ex-Partner: No    Emotionally Abused: No    Physically Abused: No    Sexually Abused: No   Family History  Problem Relation Age of Onset  Rheumatic fever Father         Died suddenly age 56   Diabetes Father    Cancer Father        colon   Heart disease Father        rheumatic fever x 3   Arthritis Maternal Grandmother    Hypertension Maternal Grandmother    Diabetes Paternal Grandmother    Vision loss Paternal Grandmother        glaucoma      Review of Systems     Objective:   Physical Exam Vitals reviewed.  Constitutional:      General: She is not in acute distress.    Appearance: She is well-developed. She is not diaphoretic.  HENT:     Head: Normocephalic and atraumatic.     Right Ear: Tympanic membrane and ear canal normal.     Left Ear: Tympanic membrane and ear canal normal.     Nose: Rhinorrhea present. No congestion.     Mouth/Throat:     Mouth: Mucous membranes are moist.     Pharynx: Oropharynx is clear. No oropharyngeal exudate or posterior oropharyngeal erythema.  Eyes:     Conjunctiva/sclera: Conjunctivae normal.     Pupils: Pupils are equal, round, and reactive to light.  Neck:     Thyroid: No thyromegaly.     Vascular: No JVD.  Cardiovascular:     Rate and Rhythm: Normal rate and regular rhythm.     Heart sounds: Murmur heard.     No friction rub. No gallop.  Pulmonary:     Effort: Pulmonary effort is normal. No respiratory distress.     Breath sounds: Normal breath sounds. No stridor. No wheezing, rhonchi or rales.  Chest:     Chest wall: No tenderness.  Abdominal:     General: Bowel sounds are normal. There is no distension.     Palpations: Abdomen is soft. There is no mass.     Tenderness: There is no abdominal tenderness. There is no guarding or rebound.  Musculoskeletal:        General: No tenderness. Normal range of motion.     Cervical back: Neck supple.     Right lower leg: No edema.     Left lower leg: No edema.  Lymphadenopathy:     Cervical: No cervical adenopathy.  Skin:    General: Skin is warm.     Coloration: Skin is not pale.     Findings: No erythema or rash.  Neurological:      Mental Status: She is alert and oriented to person, place, and time.     Cranial Nerves: No cranial nerve deficit.     Motor: No abnormal muscle tone.     Coordination: Coordination normal.     Deep Tendon Reflexes: Reflexes normal.  Psychiatric:        Behavior: Behavior normal.        Thought Content: Thought content normal.        Judgment: Judgment normal.           Assessment & Plan:  Viral upper respiratory tract infection I believe the patient has a viral upper respiratory infection.  I recommended that she use Flonase and Mucinex for nasal congestion.  She can also use Coricidin HBP for nasal congestion.  I recommended tincture of time.  The patient has a very high risk given her underlying medical comorbidities.  I did give the patient a prescription for Levaquin with instructions to fill the  prescription if she develops a high fever with severe sinus pain or chest congestion and shortness of breath.

## 2023-03-06 ENCOUNTER — Other Ambulatory Visit: Payer: Self-pay | Admitting: Family Medicine

## 2023-03-21 ENCOUNTER — Other Ambulatory Visit: Payer: Self-pay | Admitting: Family Medicine

## 2023-03-28 ENCOUNTER — Ambulatory Visit (INDEPENDENT_AMBULATORY_CARE_PROVIDER_SITE_OTHER): Payer: Medicare PPO

## 2023-03-28 DIAGNOSIS — I442 Atrioventricular block, complete: Secondary | ICD-10-CM | POA: Diagnosis not present

## 2023-03-28 LAB — CUP PACEART REMOTE DEVICE CHECK
Battery Remaining Longevity: 49 mo
Battery Voltage: 2.95 V
Brady Statistic AP VP Percent: 6.98 %
Brady Statistic AP VS Percent: 0 %
Brady Statistic AS VP Percent: 93.01 %
Brady Statistic AS VS Percent: 0.01 %
Brady Statistic RA Percent Paced: 6.97 %
Brady Statistic RV Percent Paced: 99.99 %
Date Time Interrogation Session: 20240415215502
Implantable Lead Connection Status: 753985
Implantable Lead Connection Status: 753985
Implantable Lead Implant Date: 20181009
Implantable Lead Implant Date: 20181009
Implantable Lead Location: 753859
Implantable Lead Location: 753860
Implantable Lead Model: 5076
Implantable Lead Model: 5076
Implantable Pulse Generator Implant Date: 20181009
Lead Channel Impedance Value: 247 Ohm
Lead Channel Impedance Value: 285 Ohm
Lead Channel Impedance Value: 342 Ohm
Lead Channel Impedance Value: 418 Ohm
Lead Channel Pacing Threshold Amplitude: 0.875 V
Lead Channel Pacing Threshold Amplitude: 1 V
Lead Channel Pacing Threshold Pulse Width: 0.4 ms
Lead Channel Pacing Threshold Pulse Width: 0.4 ms
Lead Channel Sensing Intrinsic Amplitude: 2.5 mV
Lead Channel Sensing Intrinsic Amplitude: 2.5 mV
Lead Channel Sensing Intrinsic Amplitude: 5.25 mV
Lead Channel Setting Pacing Amplitude: 2 V
Lead Channel Setting Pacing Amplitude: 2.5 V
Lead Channel Setting Pacing Pulse Width: 0.4 ms
Lead Channel Setting Sensing Sensitivity: 2 mV
Zone Setting Status: 755011
Zone Setting Status: 755011

## 2023-04-06 ENCOUNTER — Other Ambulatory Visit: Payer: Medicare PPO

## 2023-04-06 DIAGNOSIS — I10 Essential (primary) hypertension: Secondary | ICD-10-CM

## 2023-04-06 DIAGNOSIS — Z13220 Encounter for screening for lipoid disorders: Secondary | ICD-10-CM

## 2023-04-06 LAB — CBC WITH DIFFERENTIAL/PLATELET
Eosinophils Absolute: 295 cells/uL (ref 15–500)
Hemoglobin: 12.7 g/dL (ref 11.7–15.5)
MCH: 29.1 pg (ref 27.0–33.0)
MCHC: 33.5 g/dL (ref 32.0–36.0)
MCV: 86.9 fL (ref 80.0–100.0)
Neutro Abs: 4478 cells/uL (ref 1500–7800)

## 2023-04-07 LAB — CBC WITH DIFFERENTIAL/PLATELET
Absolute Monocytes: 994 cells/uL — ABNORMAL HIGH (ref 200–950)
Basophils Absolute: 94 cells/uL (ref 0–200)
Basophils Relative: 1.3 %
Eosinophils Relative: 4.1 %
HCT: 37.9 % (ref 35.0–45.0)
Lymphs Abs: 1339 cells/uL (ref 850–3900)
MPV: 9.1 fL (ref 7.5–12.5)
Monocytes Relative: 13.8 %
Neutrophils Relative %: 62.2 %
Platelets: 383 10*3/uL (ref 140–400)
RBC: 4.36 10*6/uL (ref 3.80–5.10)
RDW: 14.3 % (ref 11.0–15.0)
Total Lymphocyte: 18.6 %
WBC: 7.2 10*3/uL (ref 3.8–10.8)

## 2023-04-07 LAB — COMPLETE METABOLIC PANEL WITH GFR
AG Ratio: 2.1 (calc) (ref 1.0–2.5)
ALT: 20 U/L (ref 6–29)
AST: 24 U/L (ref 10–35)
Albumin: 4.5 g/dL (ref 3.6–5.1)
Alkaline phosphatase (APISO): 96 U/L (ref 37–153)
BUN/Creatinine Ratio: 30 (calc) — ABNORMAL HIGH (ref 6–22)
BUN: 41 mg/dL — ABNORMAL HIGH (ref 7–25)
CO2: 31 mmol/L (ref 20–32)
Calcium: 10.5 mg/dL — ABNORMAL HIGH (ref 8.6–10.4)
Chloride: 94 mmol/L — ABNORMAL LOW (ref 98–110)
Creat: 1.37 mg/dL — ABNORMAL HIGH (ref 0.60–1.00)
Globulin: 2.1 g/dL (calc) (ref 1.9–3.7)
Glucose, Bld: 106 mg/dL — ABNORMAL HIGH (ref 65–99)
Potassium: 4.5 mmol/L (ref 3.5–5.3)
Sodium: 139 mmol/L (ref 135–146)
Total Bilirubin: 0.6 mg/dL (ref 0.2–1.2)
Total Protein: 6.6 g/dL (ref 6.1–8.1)
eGFR: 41 mL/min/{1.73_m2} — ABNORMAL LOW (ref >=60)

## 2023-04-07 LAB — LIPID PANEL
Cholesterol: 119 mg/dL (ref <200)
HDL: 65 mg/dL (ref >=50)
LDL Cholesterol (Calc): 41 mg/dL (calc)
Non-HDL Cholesterol (Calc): 54 mg/dL (calc) (ref <130)
Total CHOL/HDL Ratio: 1.8 (calc) (ref <5.0)
Triglycerides: 58 mg/dL (ref <150)

## 2023-04-11 ENCOUNTER — Ambulatory Visit (INDEPENDENT_AMBULATORY_CARE_PROVIDER_SITE_OTHER): Payer: Medicare PPO | Admitting: Family Medicine

## 2023-04-11 ENCOUNTER — Encounter: Payer: Self-pay | Admitting: Family Medicine

## 2023-04-11 VITALS — BP 120/62 | HR 87 | Temp 98.1°F | Ht 65.0 in | Wt 104.0 lb

## 2023-04-11 DIAGNOSIS — Z0000 Encounter for general adult medical examination without abnormal findings: Secondary | ICD-10-CM

## 2023-04-11 DIAGNOSIS — Z9889 Other specified postprocedural states: Secondary | ICD-10-CM | POA: Diagnosis not present

## 2023-04-11 DIAGNOSIS — M810 Age-related osteoporosis without current pathological fracture: Secondary | ICD-10-CM | POA: Diagnosis not present

## 2023-04-11 DIAGNOSIS — Z952 Presence of prosthetic heart valve: Secondary | ICD-10-CM

## 2023-04-11 DIAGNOSIS — Z0001 Encounter for general adult medical examination with abnormal findings: Secondary | ICD-10-CM

## 2023-04-11 DIAGNOSIS — T17908A Unspecified foreign body in respiratory tract, part unspecified causing other injury, initial encounter: Secondary | ICD-10-CM

## 2023-04-11 DIAGNOSIS — G8929 Other chronic pain: Secondary | ICD-10-CM

## 2023-04-11 DIAGNOSIS — M545 Low back pain, unspecified: Secondary | ICD-10-CM

## 2023-04-11 DIAGNOSIS — I370 Nonrheumatic pulmonary valve stenosis: Secondary | ICD-10-CM

## 2023-04-11 NOTE — Progress Notes (Signed)
Subjective:    Patient ID: Patricia [name], female    DOB: [birth year], 72 y.o.   MRN: [id number]  Patient is a very pleasant 72 year old Caucasian female with a past medical history of lymphoma as well as mitral valve placement with a bioprosthetic valve.  She also has a history of atrial flutter status post ablation, aortic valve replacement, pulmonary hypertension, and severe pulmonic valve stenosis.  Most recent echocardiogram was in June 2023.  I have copied the results below for my reference: FINDINGS   Left Ventricle: Left ventricular ejection fraction, by estimation, is 55  to 60%. The left ventricle has normal function. Definity contrast agent  was given IV to delineate the left ventricular endocardial borders. There  is no left ventricular  hypertrophy. Abnormal (paradoxical) septal motion consistent with  post-operative status. Left ventricular diastolic function could not be  evaluated. Left ventricular diastolic function could not be evaluated due  to mitral valve replacement.   Right Ventricle: There is mildly elevated pulmonary artery systolic  pressure. The tricuspid regurgitant velocity is 2.85 m/s, and with an  assumed right atrial pressure of 8 mmHg, the estimated right ventricular  systolic pressure is 40.5 mmHg.   Mitral Valve: There is a 25 mm Medtronic bioprosthetic valve present in  the mitral position. Procedure Date: 2019. Functional progressive mitral  valve stenosis. MV peak gradient, 14.2 mmHg. The mean mitral valve  gradient is 7.0 mmHg.   Tricuspid Valve: Tricuspid valve regurgitation is mild to moderate.   Aortic Valve: The aortic valve has been repaired/replaced. Aortic valve  regurgitation is trivial. No aortic stenosis is present. Aortic valve mean  gradient measures 4.7 mmHg. Aortic valve peak gradient measures 8.2 mmHg.  Aortic valve area, by VTI  measures 2.20 cm. There is a 19 mm Inspiris Resilia valve present in the  aortic position.  Procedure Date: 2023.   Pulmonic Valve: Pulmonic valve regurgitation is moderate to severe. Severe  pulmonic stenosis.  Patient usually gets her mammogram in August.  She schedules this on her own.  It has been 3 years since her last bone density.  She is currently on Fosamax for osteoporosis.  She is due for this again.  Due to her history, she does not require Pap smear.  Her colonoscopy was recently performed and was normal patient continues to have choking episodes.  She states almost every time she eats, she will feel like she is choking.  She will have a difficult time breathing.  I believe the patient may be aspirating and suffering from laryngospasms.  She has an appointment to see an ENT specialist for laryngoscopy.  She has not had a swallowing study yet.  She denies any swelling in her legs.  She denies any shortness of breath beyond her baseline.  She is taking both torsemide and chlorthalidone.  I believe that this is redundant especially giving her chronic kidney disease.  Her blood pressure is excellent so I think we can potentially stop the chlorthalidone.  She is also on atorvastatin however her cholesterol is outstanding.  Given her frail frame, her prediabetes has essentially resolved.  Therefore I do not feel that she is benefiting dramatically from statin.  She had a catheterization in 2019 that showed no blockages.  She does report constant severe pain in her lower back that keeps her from sleeping.  She previously had epidural steroid injections under the care of Dr. Channing Mutters.  He has since retired.  He recommended Dr. Dutch Quint.  She would  like to see him Past Medical History:  Diagnosis Date   Atrial flutter (HCC)    Ablated 1998 Dr. Graciela Husbands   Basal cell carcinoma 06/05/2019   R low back, [facility] 07/16/19   Basal cell carcinoma 04/15/2010   Right post shoulder   Basal cell carcinoma 04/15/2010   Mid back   Basal cell carcinoma (BCC) 09/29/2016   Left post shoulder. Superficial and nodular.    BCC (basal cell carcinoma) 07/21/2021   right mid back, [facility] 09/15/2021   BCC (basal cell carcinoma) 01/27/2022   left lower abdomen, [facility] 07/28/22   BCC (basal cell carcinoma) 07/28/2022   right lateral forehead, [facility] 08/10/22   BCC (basal cell carcinoma) 07/28/2022   left buttock, [facility] 08/10/22   Bleeding gastric ulcer    back in 2000   Breast cancer (HCC)    COPD (chronic obstructive pulmonary disease) (HCC)    from radiation   GERD (gastroesophageal reflux disease)    History of basal cell carcinoma (BCC) 03/22/2021   right neck and right medial ear, Moh's   Hodgkin's lymphoma (HCC)    Treated with radiation and chemo   Mitral regurgitation    pvc   Osteoporosis    lumbar verterbral fracture, left ankle fracture, dexa 2015   Pancreatitis    Pneumonia    Prediabetes    Presence of permanent cardiac pacemaker    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 09/13/2017   25 mm Medtronic Mosaic porcine stented bioprosthetic tissue valve   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   Left medial lower leg   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   right anterior thigh, [facility] 08/10/22   Ulcer    Past Surgical History:  Procedure Laterality Date   AORTIC VALVE REPLACEMENT N/A 04/07/2022   Procedure: AORTIC VALVE REPLACEMENT (AVR), USING INSPIRIS 19 MM AORTIC VALVE;  Surgeon: Alleen Borne, MD;  Location: [facility] OR;  Service: Open Heart Surgery;  Laterality: N/A;   BREAST SURGERY     breast cancer since 2005   left mastectomy   BUBBLE STUDY  01/12/2022   Procedure: BUBBLE STUDY;  Surgeon: Sande Rives, MD;  Location: [facility] ENDOSCOPY;  Service: Cardiovascular;;   CARDIAC ELECTROPHYSIOLOGY MAPPING AND ABLATION  1999   CARDIAC VALVE REPLACEMENT N/A    [employer] 03/19/2021   CATARACT EXTRACTION, BILATERAL     COLONOSCOPY     IR RADIOLOGY PERIPHERAL GUIDED IV START  08/03/2017   IR THORACENTESIS ASP PLEURAL SPACE W/IMG GUIDE  05/23/2022   IR  THORACENTESIS ASP PLEURAL SPACE W/IMG GUIDE  05/24/2022   IR US GUIDE VASC ACCESS RIGHT  08/03/2017   LAPAROTOMY     MASTECTOMY  2005   Left-axillary   MITRAL VALVE REPAIR Right 09/13/2017   Procedure: MINIMALLY INVASIVE MITRAL VALVE REPLACEMENT (MVR) with Medtronic Mosaic Porcine Heart Valve size 25mm;  Surgeon: Purcell Nails, MD;  Location: [facility] OR;  Service: Open Heart Surgery;  Laterality: Right;   ORIF ANKLE FRACTURE Left 03/21/2014   Procedure: LEFT ANKLE FRACTURE OPEN TREATMENT BILMALLEOLAR INCLUDES INTERNAL FIXATION ;  Surgeon: Sheral Apley, MD;  Location: [facility];  Service: Orthopedics;  Laterality: Left;   PACEMAKER IMPLANT N/A 09/19/2017   Medtronic Azure XT MRI conditional dual-chamber pacemaker for symptomatic complete heart block  by Dr Johney Frame   RIGHT/LEFT HEART CATH AND CORONARY ANGIOGRAPHY N/A 06/27/2017   Procedure: Right/Left Heart Cath and Coronary Angiography;  Surgeon: Laurey Morale, MD;  Location:  [facility] INVASIVE CV LAB;  Service: Cardiovascular;  Laterality: N/A;   SPLENECTOMY  1974   hodgkins disease   TEE WITHOUT CARDIOVERSION  06/12/2012   Procedure: TRANSESOPHAGEAL ECHOCARDIOGRAM (TEE);  Surgeon: Laurey Morale, MD;  Location: [facility] ENDOSCOPY;  Service: Cardiovascular;  Laterality: N/A;   TEE WITHOUT CARDIOVERSION N/A 06/29/2017   Procedure: TRANSESOPHAGEAL ECHOCARDIOGRAM (TEE);  Surgeon: Jake Bathe, MD;  Location: [facility] ENDOSCOPY;  Service: Cardiovascular;  Laterality: N/A;   TEE WITHOUT CARDIOVERSION N/A 09/13/2017   Procedure: TRANSESOPHAGEAL ECHOCARDIOGRAM (TEE);  Surgeon: Purcell Nails, MD;  Location: [facility] OR;  Service: Open Heart Surgery;  Laterality: N/A;   TEE WITHOUT CARDIOVERSION N/A 01/12/2022   Procedure: TRANSESOPHAGEAL ECHOCARDIOGRAM (TEE);  Surgeon: Sande Rives, MD;  Location: [facility] ENDOSCOPY;  Service: Cardiovascular;  Laterality: N/A;   TEE WITHOUT CARDIOVERSION N/A 04/07/2022   Procedure: TRANSESOPHAGEAL ECHOCARDIOGRAM (TEE);   Surgeon: Alleen Borne, MD;  Location: [facility] OR;  Service: Open Heart Surgery;  Laterality: N/A;   TONSILLECTOMY AND ADENOIDECTOMY     Current Outpatient Medications on File Prior to Visit  Medication Sig Dispense Refill   acetaminophen (TYLENOL) 325 MG tablet Take 2 tablets (650 mg total) by mouth 2 (two) times daily as needed for moderate pain or headache.     alendronate (FOSAMAX) 70 MG tablet Take 1 tablet (70 mg total) by mouth once a week. 12 tablet 3   atorvastatin (LIPITOR) 20 MG tablet Take 1 tablet (20 mg total) by mouth daily. 30 tablet 6   Blood Glucose Monitoring Suppl (ACCU-CHEK GUIDE) w/Device KIT 1 Units by Does not apply route daily. 1 kit 0   Calcium Carbonate (CALCIUM 600 PO) Take 600 mg by mouth 2 (two) times daily. Lunch and Dinner     chlorthalidone (HYGROTON) 25 MG tablet TAKE 1 TABLET BY MOUTH ONCE A DAY 90 tablet 2   CREON 36000-114000 units CPEP capsule Take 36,000 Units by mouth 3 (three) times daily before meals.     esomeprazole (NEXIUM) 40 MG capsule Take 40 mg by mouth daily before breakfast.   12   fluticasone (FLONASE) 50 MCG/ACT nasal spray Place 2 sprays into both nostrils daily as needed for rhinitis. 16 g 1   glucose blood (ACCU-CHEK GUIDE) test strip USE TO TEST BLOOD SUGARS EVERY MORNING 100 strip 3   Lancets (ACCU-CHEK SOFT TOUCH) lancets Check BS QAM 100 each 3   Lancets Misc. (ACCU-CHEK SOFTCLIX LANCET DEV) KIT Check BS QAM 1 kit 0   levothyroxine (SYNTHROID) 50 MCG tablet TAKE 1 TABLET BY MOUTH ONCE A DAY (Patient taking differently: Take 50 mcg by mouth daily before breakfast.) 90 tablet 3   metoprolol tartrate (LOPRESSOR) 100 MG tablet Take 1 tablet (100 mg total) by mouth 2 (two) times daily. 180 tablet 3   Multiple Vitamin (MULTIVITAMIN WITH MINERALS) TABS tablet Take 1 tablet by mouth daily with lunch. Centrum Silver.     potassium chloride SA (KLOR-CON M) 20 MEQ tablet TAKE 1 TABLET BY MOUTH ONCE A DAY 30 tablet 10   torsemide (DEMADEX) 10 MG  tablet TAKE 2 TABLETS BY MOUTH ONCE A DAY 120 tablet 3   Current Facility-Administered Medications on File Prior to Visit  Medication Dose Route Frequency Provider Last Rate Last Admin   torsemide (DEMADEX) tablet 60 mg  60 mg Oral Daily Bartle, Payton Doughty, MD         Allergies  Allergen Reactions   Aspirin Other (See Comments)    Past bleeding ulcer. GI upset  Azithromycin Other (See Comments)    pancreatitis   Penicillins Anaphylaxis, Hives, Swelling and Other (See Comments)    PATIENT HAS HAD A PCN REACTION WITH IMMEDIATE RASH, FACIAL/TONGUE/THROAT SWELLING, SOB, OR LIGHTHEADEDNESS WITH HYPOTENSION:  #  #  #  YES  #  #  #   HAS PT DEVELOPED SEVERE RASH INVOLVING MUCUS MEMBRANES or SKIN NECROSIS: #  #  #  YES  #  #  #  Has patient had a PCN reaction that required hospitalization: No Has patient had a PCN reaction occurring within the last 10 years: No    Sulfa Antibiotics Hives and Rash   Cheese Nausea And Vomiting   Social History   Socioeconomic History   Marital status: Widowed    Spouse name: Not on file   Number of children: Not on file   Years of education: Not on file   Highest education level: Master's degree (e.g., MA, MS, MEng, MEd, MSW, MBA)  Occupational History   Occupation: Runner, broadcasting/film/video (retired)  Tobacco Use   Smoking status: Never   Smokeless tobacco: Never  Vaping Use   Vaping Use: Never used  Substance and Sexual Activity   Alcohol use: No   Drug use: No   Sexual activity: Yes  Other Topics Concern   Not on file  Social History Narrative   Husband passed away from Cancer in Sep 07, 2017.   Social Determinants of Health   Financial Resource Strain: Low Risk  (03/02/2023)   Overall Financial Resource Strain (CARDIA)    Difficulty of Paying Living Expenses: Not hard at all  Food Insecurity: No Food Insecurity (03/02/2023)   Hunger Vital Sign    Worried About Running Out of Food in the Last Year: Never true    Ran Out of Food in the Last Year: Never true   Transportation Needs: No Transportation Needs (03/02/2023)   PRAPARE - Administrator, Civil Service (Medical): No    Lack of Transportation (Non-Medical): No  Physical Activity: Insufficiently Active (03/02/2023)   Exercise Vital Sign    Days of Exercise per Week: 5 days    Minutes of Exercise per Session: 20 min  Stress: No Stress Concern Present (03/02/2023)   [employer] of Occupational Health - Occupational Stress Questionnaire    Feeling of Stress : Only a little  Social Connections: Moderately Integrated (03/02/2023)   Social Connection and Isolation Panel [NHANES]    Frequency of Communication with Friends and Family: Twice a week    Frequency of Social Gatherings with Friends and Family: Once a week    Attends Religious Services: More than 4 times per year    Active Member of [employer] or Organizations: Yes    Attends Banker Meetings: More than 4 times per year    Marital Status: Widowed  Intimate Partner Violence: Not At Risk (12/06/2022)   Humiliation, Afraid, Rape, and Kick questionnaire    Fear of Current or Ex-Partner: No    Emotionally Abused: No    Physically Abused: No    Sexually Abused: No   Family History  Problem Relation Age of Onset   Rheumatic fever Father        Died suddenly age 33   Diabetes Father    Cancer Father        colon   Heart disease Father        rheumatic fever x 3   Arthritis Maternal Grandmother    Hypertension Maternal Grandmother    Diabetes  Paternal Grandmother    Vision loss Paternal Grandmother        glaucoma      Review of Systems     Objective:   Physical Exam Vitals reviewed.  Constitutional:      General: She is not in acute distress.    Appearance: She is well-developed. She is not diaphoretic.  HENT:     Head: Normocephalic and atraumatic.  Neck:     Thyroid: No thyromegaly.     Vascular: No JVD.  Cardiovascular:     Rate and Rhythm: Normal rate and regular rhythm.     Heart  sounds: Normal heart sounds. No murmur heard.    No friction rub. No gallop.  Pulmonary:     Effort: Pulmonary effort is normal. No respiratory distress.     Breath sounds: Normal breath sounds. No stridor. No wheezing or rales.  Chest:     Chest wall: No tenderness.  Abdominal:     General: Bowel sounds are normal. There is no distension.     Palpations: Abdomen is soft. There is no mass.     Tenderness: There is no abdominal tenderness. There is no guarding or rebound.  Musculoskeletal:        General: No tenderness. Normal range of motion.     Cervical back: Neck supple.  Lymphadenopathy:     Cervical: No cervical adenopathy.  Skin:    General: Skin is warm.     Coloration: Skin is not pale.     Findings: No erythema or rash.  Neurological:     Mental Status: She is alert and oriented to person, place, and time.     Cranial Nerves: No cranial nerve deficit.     Motor: No abnormal muscle tone.     Coordination: Coordination normal.     Deep Tendon Reflexes: Reflexes normal.  Psychiatric:        Behavior: Behavior normal.        Thought Content: Thought content normal.        Judgment: Judgment normal.           Assessment & Plan:  Aspiration into airway, initial encounter - Plan: SLP modified barium swallow  Osteoporosis, unspecified osteoporosis type, unspecified pathological fracture presence - Plan: DG Bone Density  Encounter for Medicare annual wellness exam  S/P MVR (mitral valve repair)  S/P aortic valve replacement  Pulmonary valve stenosis, unspecified etiology  Chronic right-sided low back pain without sciatica Patient is has stage III chronic kidney disease.  Therefore I recommended holding chlorthalidone as her blood pressure is excellent.  I also recommended holding potassium and rechecking her potassium with a BMP in 2 weeks.  Her calcium is elevated.  This could be due to her calcium supplement for the chlorthalidone.  If her calcium does not improve  off the chlorthalidone I will reduce her calcium supplement.  I will repeat her bone density test.  The patient has been on Fosamax for more than 5 years.  If her bone density is stable, we may consider a therapeutic medication from the Fosamax.  I recommended RSV and COVID vaccine.  Her mammogram is due in August and her colonoscopy is up-to-date.  I have asked the patient to stop atorvastatin as her cholesterol is outstanding and she has no history of coronary artery disease on catheterization.  I will consult Dr. Dutch Quint regarding her back pain per the patient's request.  I will also schedule the patient for a swallow study as I  am concerned that she may be having laryngospasms due to aspiration.  Await the results of her laryngoscopy

## 2023-04-12 ENCOUNTER — Other Ambulatory Visit (HOSPITAL_COMMUNITY): Payer: Self-pay

## 2023-04-12 DIAGNOSIS — R1310 Dysphagia, unspecified: Secondary | ICD-10-CM

## 2023-04-18 ENCOUNTER — Other Ambulatory Visit: Payer: Self-pay | Admitting: Otolaryngology

## 2023-04-18 DIAGNOSIS — J3801 Paralysis of vocal cords and larynx, unilateral: Secondary | ICD-10-CM | POA: Diagnosis not present

## 2023-04-18 DIAGNOSIS — I379 Nonrheumatic pulmonary valve disorder, unspecified: Secondary | ICD-10-CM | POA: Insufficient documentation

## 2023-04-18 DIAGNOSIS — R490 Dysphonia: Secondary | ICD-10-CM | POA: Diagnosis not present

## 2023-04-18 DIAGNOSIS — R1314 Dysphagia, pharyngoesophageal phase: Secondary | ICD-10-CM | POA: Diagnosis not present

## 2023-04-18 NOTE — Progress Notes (Unsigned)
  Cardiology Office Note:   Date:  04/20/2023  ID:  MONTANNA HELLWIG, DOB [birth year], MRN [id number]  History of Present Illness:   Patricia [name] is a 72 y.o. 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 She has had atrial flutter ablated.  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  She is now status post AVR.  She had post procedure atrial fibrillation.  She had pleural effusions requiring thoracentesis.  She eventually recovered.   She had a history of atrial flutter status post ablation.  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  Since I saw her she has done well.  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 The patient denies any new symptoms such as chest discomfort, neck or arm discomfort. There has been no new shortness of breath, PND or orthopnea. There have been no reported palpitations, presyncope or syncope.    ROS: As stated in the HPI and negative for all other systems.  Studies Reviewed:    EKG: Sinus rhythm with ventricular pacing 100% capture, rate 95    Risk Assessment/Calculations:              Physical Exam:   VS:  BP 124/72 (BP Location: Right Arm, Patient Position: Sitting, Cuff Size: Normal)   Pulse 92   Ht 5\' 6"  (1.676 m)   Wt 104 lb 12.8 oz (47.5 kg)   SpO2 100%   BMI 16.92 kg/m    Wt Readings from Last 3 Encounters:  04/20/23 104 lb 12.8 oz (47.5 kg)  04/11/23 104 lb (47.2 kg)  03/02/23 107 lb (48.5 kg)     GEN: Well nourished, well developed in no acute distress NECK: No JVD; No carotid bruits CARDIAC: RRR, soft apical systolic murmur, no diastolic murmurs, rubs, gallops RESPIRATORY:  Clear to auscultation without rales, wheezing or rhonchi  ABDOMEN: Soft, non-tender, non-distended EXTREMITIES:  No edema; No deformity   ASSESSMENT AND PLAN:   MVR: She had stable valve replacement in June.  She understands endocarditis prophylaxis.   AVR:   She is  doing well status post AVR.  No change in therapy.    FLUTTER:   She has had atrial flutter ablated.  She has had no recurrent dysrhythmias.  She has off of any anticoagulation that she has had life-threatening serious GI bleeding in the past.   HTN:   The blood pressure is at target.  No change in therapy.   Pulmonic stenosis and regurgitation: I am going to follow this up with an echo in June.  Other: She was to reduce her Torsemide and I think this is reasonable to once a day and then as needed.        Signed, Rollene Rotunda, MD

## 2023-04-19 ENCOUNTER — Ambulatory Visit (HOSPITAL_COMMUNITY)
Admission: RE | Admit: 2023-04-19 | Discharge: 2023-04-19 | Disposition: A | Discharge status: Home / Self Care | Payer: Medicare PPO | Source: Ambulatory Visit | Attending: Family Medicine | Admitting: Family Medicine

## 2023-04-19 DIAGNOSIS — R1312 Dysphagia, oropharyngeal phase: Secondary | ICD-10-CM | POA: Diagnosis not present

## 2023-04-19 DIAGNOSIS — T17908A Unspecified foreign body in respiratory tract, part unspecified causing other injury, initial encounter: Secondary | ICD-10-CM | POA: Diagnosis not present

## 2023-04-19 DIAGNOSIS — R059 Cough, unspecified: Secondary | ICD-10-CM | POA: Diagnosis not present

## 2023-04-19 DIAGNOSIS — R1310 Dysphagia, unspecified: Secondary | ICD-10-CM | POA: Diagnosis not present

## 2023-04-19 NOTE — Therapy (Signed)
Modified Barium Swallow Study  Patient Details  Name: Patricia [name] MRN: [id number] Date of Birth: 1951/08/25  Today's Date: 04/19/2023  Modified Barium Swallow completed.  Full report located under Chart Review in the Imaging Section.  History of Present Illness Patricia [name] is a 72 y.o. female with PMH: osteoporosis, s/p MVR, s/p aortic valve replacement, CKD stage III, lymphoma, pulmonary hypertension, severe pulmonic valve stenosis. She has had hoarse voice and gets choked easily when eating and drinking since having ETT during aortic valve replacement in 2023. Patient reports her throat was injured from ETT She saw an ENT on 04/18/23 (Dr. 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   Clinical Impression Patient presents with a mild oropharyngeal swallow that is without penetration or aspiration of any tested consistency (thin, nectar thick, honey thick, puree, solid, 13mm barium tablet). Mildly reduced base of tongue retraction led to trace residue of barium on tongue but with patient clearing indpendently with subsequent swallows. Epiglottic inversion was [facility] and no barium observed in laryngeal vestibule. Trace to no residue remained after initial swallows in vallecular and pyriform sinus and ultimately cleared with subsequent swallows. Patient did present with prominent cricopharyngeal bar but PES opening was [facility] and no barium retention or retrograde movement observed in upper esophagus. With heavier/thicker boluses (honey thick, puree, solid) bolus transit through PES and upper esophagus was mildly slowed, but was complete. 13mm barium tablet transited pharyngeally and through esophagus without observed difficulty or delay. SLP suspects that patient's sensation of "choking" with foods is related to likely slow transit of heavier, more dense boluses and possibly brief stasis of these boluses within pharynx. She does not appear to  be at a high risk of aspiration with PO's as per this study. SLP provided patient with IDDSI diet handout: level 7 Regular easy to chew.   Factors that may increase risk of adverse event in presence of aspiration [employer] Oaks [employer] Clearance Coots 2021): Poor general health and/or compromised immunity  Swallow Evaluation Recommendations Recommendations: PO diet PO Diet Recommendation: Regular;Dysphagia 3 (Mechanical soft);Thin liquids (Level 0) Liquid Administration via: Cup;Straw Medication Administration: Whole meds with liquid Swallowing strategies  : Follow solids with liquids Postural changes: Stay upright 30-60 min after meals      Angela Nevin, MA, CCC-SLP Speech Therapy

## 2023-04-20 ENCOUNTER — Encounter: Payer: Self-pay | Admitting: Cardiology

## 2023-04-20 ENCOUNTER — Ambulatory Visit: Payer: Medicare PPO | Attending: Cardiology | Admitting: Cardiology

## 2023-04-20 VITALS — BP 124/72 | HR 92 | Ht 66.0 in | Wt 104.8 lb

## 2023-04-20 DIAGNOSIS — J9601 Acute respiratory failure with hypoxia: Secondary | ICD-10-CM | POA: Diagnosis not present

## 2023-04-20 DIAGNOSIS — I4892 Unspecified atrial flutter: Secondary | ICD-10-CM | POA: Diagnosis not present

## 2023-04-20 DIAGNOSIS — I10 Essential (primary) hypertension: Secondary | ICD-10-CM

## 2023-04-20 DIAGNOSIS — Z8679 Personal history of other diseases of the circulatory system: Secondary | ICD-10-CM | POA: Diagnosis not present

## 2023-04-20 DIAGNOSIS — Z952 Presence of prosthetic heart valve: Secondary | ICD-10-CM

## 2023-04-20 MED ORDER — TORSEMIDE 10 MG PO TABS: 10.0000 mg | ORAL_TABLET | Freq: Every day | ORAL | 3 refills | Status: DC

## 2023-04-20 NOTE — Patient Instructions (Signed)
Medication Instructions:  Your physician has recommended you make the following change in your medication:   -Decrease torsemide (demadex) to 10mg  once daily.  *If you need a refill on your cardiac medications before your next appointment, please call your pharmacy*   Testing/Procedures: Your physician has requested that you have an echocardiogram. Echocardiography is a painless test that uses sound waves to create images of your heart. It provides your doctor with information about the size and shape of your heart and how well your heart's chambers and valves are working. This procedure takes approximately one hour. There are no restrictions for this procedure. Please do NOT wear cologne, perfume, aftershave, or lotions (deodorant is allowed). Please arrive 15 minutes prior to your appointment time. This will take place at [address] [place] **To do in June**    Follow-Up: At [facility], you and your health needs are our priority.  As part of our continuing mission to provide you with exceptional heart care, we have created designated Provider Care Teams.  These Care Teams include your primary Cardiologist (physician) and Advanced Practice Providers (APPs -  Physician Assistants and Nurse Practitioners) who all work together to provide you with the care you need, when you need it.  We recommend signing up for the patient portal called "MyChart".  Sign up information is provided on this After Visit Summary.  MyChart is used to connect with patients for Virtual Visits (Telemedicine).  Patients are able to view lab/test results, encounter notes, upcoming appointments, etc.  Non-urgent messages can be sent to your provider as well.   To learn more about what you can do with MyChart, go to [email or web address].    Your next appointment:   6 month(s)  Provider:   Rollene Rotunda, MD

## 2023-04-24 ENCOUNTER — Other Ambulatory Visit: Payer: Medicare PPO

## 2023-04-24 DIAGNOSIS — T17908A Unspecified foreign body in respiratory tract, part unspecified causing other injury, initial encounter: Secondary | ICD-10-CM | POA: Diagnosis not present

## 2023-04-25 ENCOUNTER — Ambulatory Visit
Admission: RE | Admit: 2023-04-25 | Discharge: 2023-04-25 | Disposition: A | Discharge status: Home / Self Care | Payer: Medicare PPO | Source: Ambulatory Visit | Attending: Otolaryngology | Admitting: Otolaryngology

## 2023-04-25 DIAGNOSIS — J3801 Paralysis of vocal cords and larynx, unilateral: Secondary | ICD-10-CM

## 2023-04-25 DIAGNOSIS — S1983XA Other specified injuries of vocal cord, initial encounter: Secondary | ICD-10-CM | POA: Diagnosis not present

## 2023-04-25 LAB — BASIC METABOLIC PANEL
BUN/Creatinine Ratio: 25 (calc) — ABNORMAL HIGH (ref 6–22)
BUN: 30 mg/dL — ABNORMAL HIGH (ref 7–25)
CO2: 34 mmol/L — ABNORMAL HIGH (ref 20–32)
Calcium: 10.3 mg/dL (ref 8.6–10.4)
Chloride: 97 mmol/L — ABNORMAL LOW (ref 98–110)
Creat: 1.22 mg/dL — ABNORMAL HIGH (ref 0.60–1.00)
Glucose, Bld: 105 mg/dL — ABNORMAL HIGH (ref 65–99)
Potassium: 4.6 mmol/L (ref 3.5–5.3)
Sodium: 141 mmol/L (ref 135–146)

## 2023-04-25 MED ORDER — IOHEXOL 300 MG/ML  SOLN
100.0000 mL | Freq: Once | INTRAMUSCULAR | Status: AC | PRN
  Administered 2023-04-25: 80 mL via INTRAVENOUS

## 2023-05-01 ENCOUNTER — Other Ambulatory Visit: Payer: Self-pay | Admitting: Family Medicine

## 2023-05-01 NOTE — Progress Notes (Signed)
Remote pacemaker transmission.   

## 2023-05-02 NOTE — Telephone Encounter (Signed)
Requested medications are due for refill today.  yes  Requested medications are on the active medications list.  yes  Last refill. 03/17/2022 #90 3 rf  Future visit scheduled.   no  Notes to clinic.  Labs are expired. Pt needs appointment.    Requested Prescriptions  Pending Prescriptions Disp Refills   levothyroxine (SYNTHROID) 50 MCG tablet [Pharmacy Med Name: LEVOTHYROXINE SODIUM TABLET] 90 tablet 3    Sig: TAKE ONE TABLET BY MOUTH ONCE A DAY     Endocrinology:  Hypothyroid Agents Failed - 05/01/2023  9:25 AM      Failed - TSH in normal range and within 360 days    TSH  Date Value Ref Range Status  12/16/2019 2.75 0.40 - 4.50 mIU/L Final         Failed - Valid encounter within last 12 months    Recent Outpatient Visits           1 year ago Dyspnea, unspecified type   [facility] Donita Brooks, MD   1 year ago Bilateral lower extremity edema   [facility] Valentino Nose, NP   1 year ago Subacute cough   [facility] Donita Brooks, MD   2 years ago Acute right-sided low back pain without sciatica   [facility] Donita Brooks, MD   3 years ago Postmenopausal estrogen deficiency   [facility] Pickard, Priscille Heidelberg, MD       Future Appointments             In 2 months Deirdre Evener, MD [facility]   In 5 months Rollene Rotunda, MD [facility]

## 2023-05-03 DIAGNOSIS — Z681 Body mass index (BMI) 19 or less, adult: Secondary | ICD-10-CM | POA: Diagnosis not present

## 2023-05-03 DIAGNOSIS — S32000A Wedge compression fracture of unspecified lumbar vertebra, initial encounter for closed fracture: Secondary | ICD-10-CM | POA: Diagnosis not present

## 2023-05-05 ENCOUNTER — Telehealth: Payer: Self-pay

## 2023-05-05 ENCOUNTER — Other Ambulatory Visit: Payer: Self-pay | Admitting: Neurosurgery

## 2023-05-05 DIAGNOSIS — S32000A Wedge compression fracture of unspecified lumbar vertebra, initial encounter for closed fracture: Secondary | ICD-10-CM

## 2023-05-05 NOTE — Telephone Encounter (Signed)
Pt called in asking if nurse/pcp would fax results of swallow test to Dr. Andee Poles please.Please advise  Cb#: [phone]

## 2023-05-05 NOTE — Telephone Encounter (Signed)
My chart message sent to patient to call and schedule Lab appointment to check TSH. Last TSH in chart was 12/16/2019. Mjp,lpn

## 2023-05-09 ENCOUNTER — Other Ambulatory Visit: Payer: Medicare PPO

## 2023-05-09 DIAGNOSIS — R5383 Other fatigue: Secondary | ICD-10-CM

## 2023-05-10 ENCOUNTER — Other Ambulatory Visit: Payer: Self-pay

## 2023-05-10 DIAGNOSIS — R849 Unspecified abnormal finding in specimens from respiratory organs and thorax: Secondary | ICD-10-CM

## 2023-05-10 LAB — TSH: TSH: 6.51 mIU/L — ABNORMAL HIGH (ref 0.40–4.50)

## 2023-05-10 MED ORDER — LEVOTHYROXINE SODIUM 75 MCG PO TABS: 75.0000 ug | ORAL_TABLET | Freq: Every day | ORAL | 3 refills | Status: DC

## 2023-05-11 ENCOUNTER — Ambulatory Visit
Admission: RE | Admit: 2023-05-11 | Discharge: 2023-05-11 | Disposition: A | Discharge status: Home / Self Care | Payer: Medicare PPO | Source: Ambulatory Visit | Attending: Neurosurgery | Admitting: Neurosurgery

## 2023-05-11 DIAGNOSIS — S32000A Wedge compression fracture of unspecified lumbar vertebra, initial encounter for closed fracture: Secondary | ICD-10-CM

## 2023-05-17 DIAGNOSIS — M5136 Other intervertebral disc degeneration, lumbar region: Secondary | ICD-10-CM | POA: Diagnosis not present

## 2023-05-30 ENCOUNTER — Ambulatory Visit (HOSPITAL_COMMUNITY): Payer: Medicare PPO | Attending: Cardiology

## 2023-05-30 DIAGNOSIS — I4892 Unspecified atrial flutter: Secondary | ICD-10-CM | POA: Insufficient documentation

## 2023-05-30 DIAGNOSIS — Z8679 Personal history of other diseases of the circulatory system: Secondary | ICD-10-CM | POA: Diagnosis not present

## 2023-05-30 DIAGNOSIS — M47816 Spondylosis without myelopathy or radiculopathy, lumbar region: Secondary | ICD-10-CM | POA: Diagnosis not present

## 2023-05-30 DIAGNOSIS — I10 Essential (primary) hypertension: Secondary | ICD-10-CM | POA: Insufficient documentation

## 2023-05-30 DIAGNOSIS — Z952 Presence of prosthetic heart valve: Secondary | ICD-10-CM | POA: Insufficient documentation

## 2023-05-30 DIAGNOSIS — J9601 Acute respiratory failure with hypoxia: Secondary | ICD-10-CM | POA: Diagnosis not present

## 2023-05-30 DIAGNOSIS — Z950 Presence of cardiac pacemaker: Secondary | ICD-10-CM | POA: Diagnosis not present

## 2023-05-30 LAB — ECHOCARDIOGRAM COMPLETE
AV Mean grad: 4.6 mmHg
AV Peak grad: 7.8 mmHg
Ao pk vel: 1.4 m/s
Area-P 1/2: 2.86 cm2
S' Lateral: 2.2 cm

## 2023-05-30 MED ORDER — PERFLUTREN LIPID MICROSPHERE
1.0000 mL | INTRAVENOUS | Status: AC | PRN
Start: 2023-05-30 — End: 2023-05-30
  Administered 2023-05-30: 2 mL via INTRAVENOUS

## 2023-05-31 ENCOUNTER — Other Ambulatory Visit: Payer: Self-pay | Admitting: Family Medicine

## 2023-05-31 NOTE — Telephone Encounter (Signed)
Unable to refill per protocol, Rx expired. Discontinued 05/10/23. Dose change  Requested Prescriptions  Pending Prescriptions Disp Refills   levothyroxine (SYNTHROID) 50 MCG tablet [Pharmacy Med Name: LEVOTHYROXINE SODIUM TABLET] 30 tablet 0    Sig: TAKE ONE TABLET (50 MCG TOTAL) BY MOUTH DAILY BEFORE BREAKFAST.     Endocrinology:  Hypothyroid Agents Failed - 05/31/2023 12:41 PM      Failed - TSH in normal range and within 360 days    TSH  Date Value Ref Range Status  05/09/2023 6.51 (H) 0.40 - 4.50 mIU/L Final         Failed - Valid encounter within last 12 months    Recent Outpatient Visits           1 year ago Dyspnea, unspecified type   [facility] Tanya Nones, Priscille Heidelberg, MD   1 year ago Bilateral lower extremity edema   [facility] Valentino Nose, NP   1 year ago Subacute cough   [facility] Donita Brooks, MD   2 years ago Acute right-sided low back pain without sciatica   [facility] Donita Brooks, MD   3 years ago Postmenopausal estrogen deficiency   [facility] Pickard, Priscille Heidelberg, MD       Future Appointments             In 1 month Deirdre Evener, MD [facility]   In 4 months Rollene Rotunda, MD [facility] at [facility]

## 2023-06-14 ENCOUNTER — Encounter: Payer: Medicare PPO | Admitting: Dermatology

## 2023-06-21 IMAGING — MR MR KNEE*R* W/O CM
4 of 6 series · 19 of 40 positions shown · non-contrast
Comparison: None.

CLINICAL DATA: Right medial knee anterior knee pain for 1 year

EXAM:
MRI OF THE RIGHT KNEE WITHOUT CONTRAST
TECHNIQUE: Multiplanar, multisequence MR imaging of the knee was performed. No
intravenous contrast was administered. Sagittal T2 fat saturated
sequence was not performed.

[Series 3: PD fat-sat · axial · 4.0mm · 0.29mm/px · z∈[-82,+27]mm · 7 of 23 slices shown (1 of 3)]
[im 1/23]
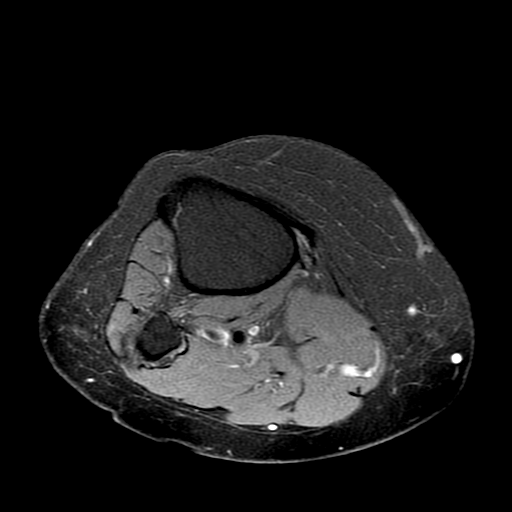
[im 4/23]
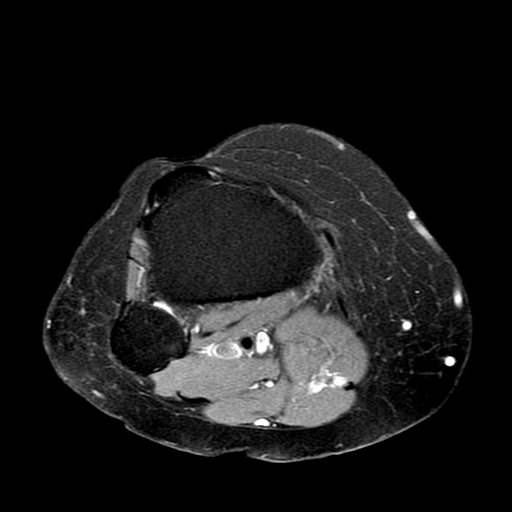
[im 8/23]
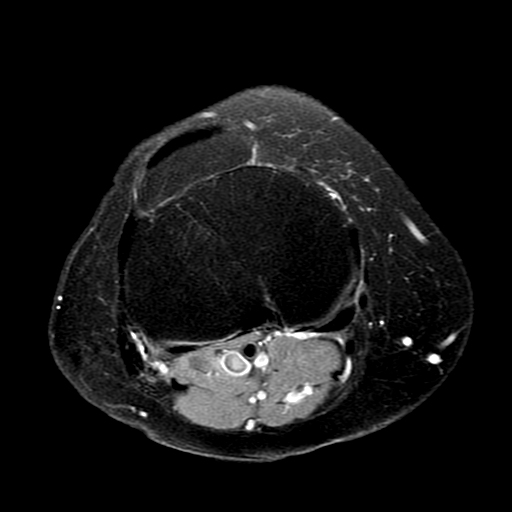
[im 12/23]
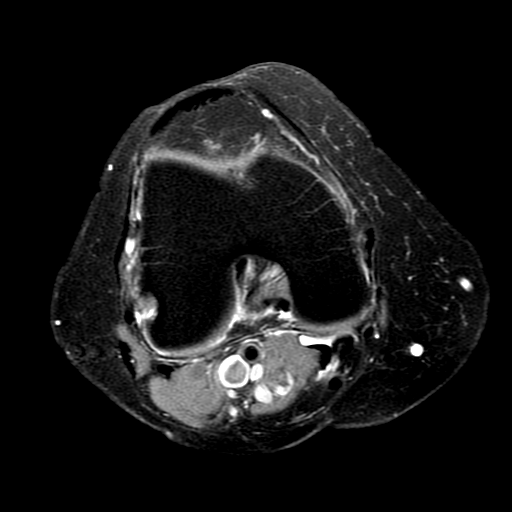
[im 15/23]
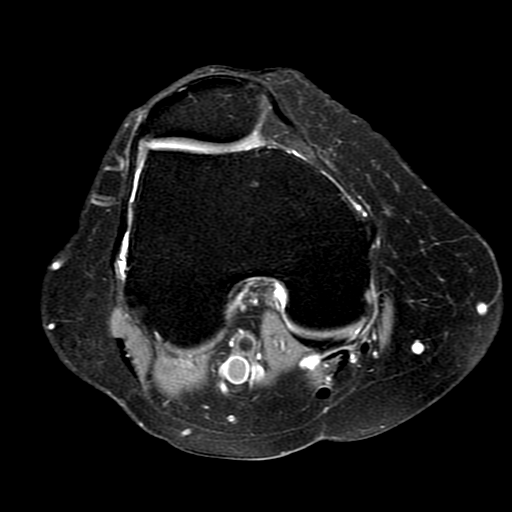
[im 19/23]
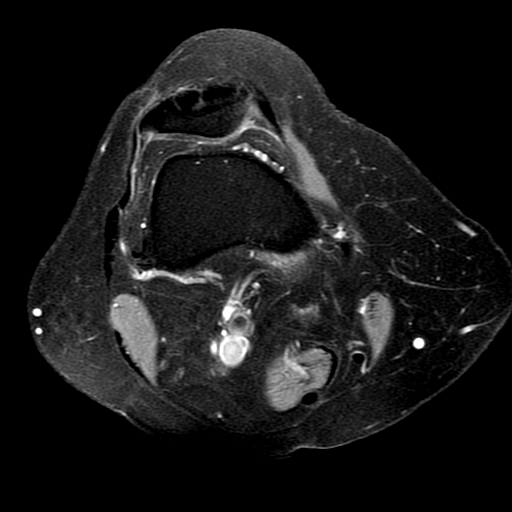
[im 23/23]
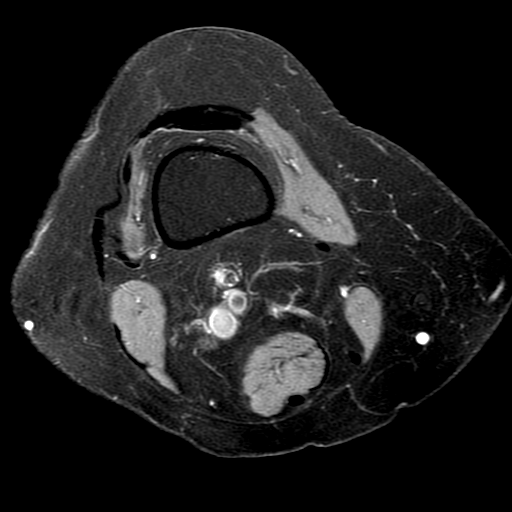

[Series 4: PD fat-sat · coronal · 4.0mm · 0.31mm/px · 6 of 19 slices shown (2 of 3)]
[im 1/19]
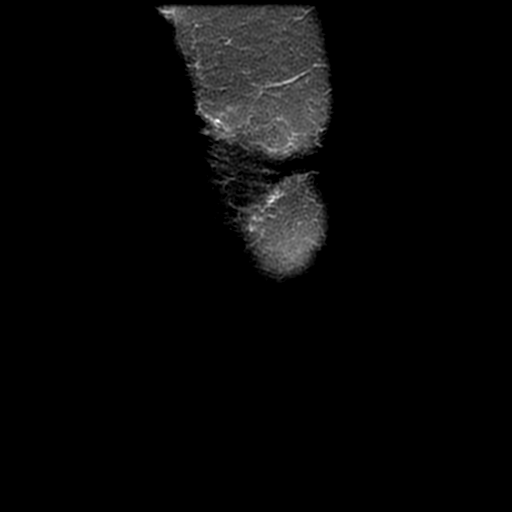
[im 4/19]
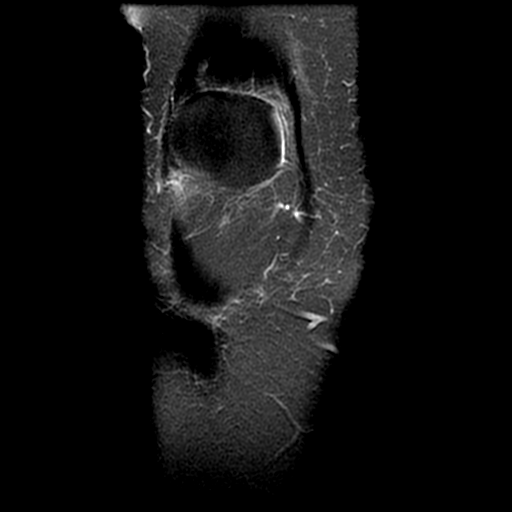
[im 7/19]
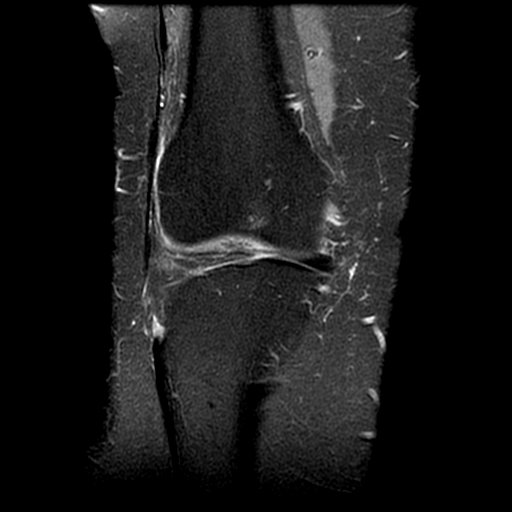
[im 10/19]
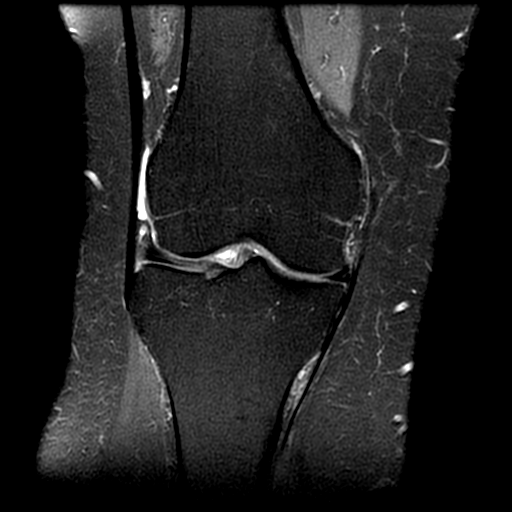
[im 13/19]
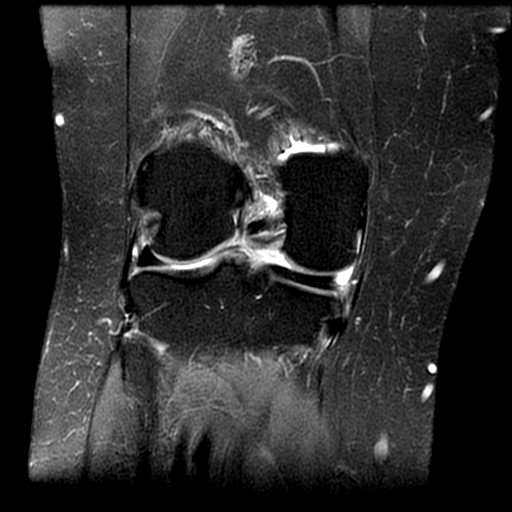
[im 16/19]
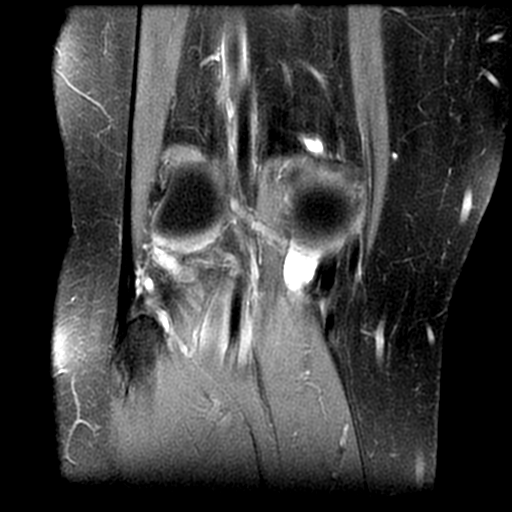

[Series 5: T2 fat-sat · coronal · 4.0mm · 0.31mm/px · 3 of 19 slices shown]
[im 4/19]
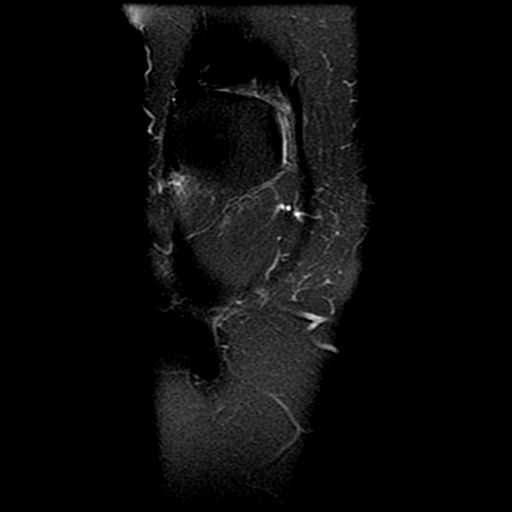
[im 10/19]
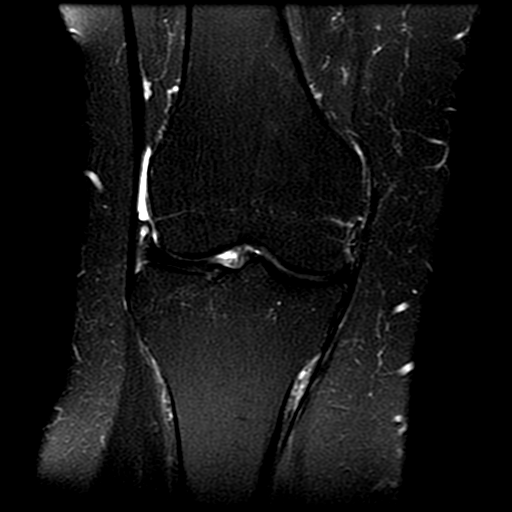
[im 16/19]
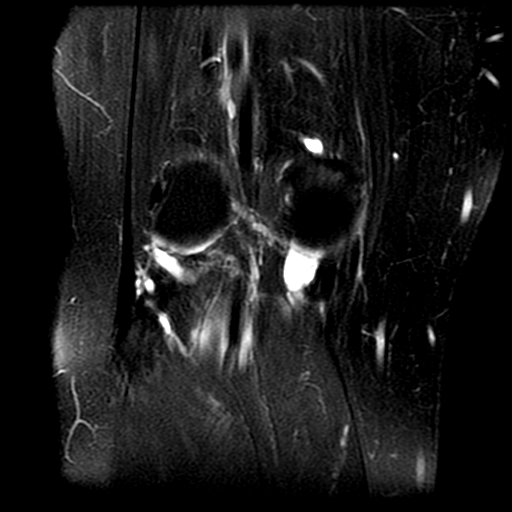

[Series 7: PD fat-sat · sagittal · 4.0mm · 0.29mm/px · 3 of 19 slices shown (3 of 3)]
[im 4/19]
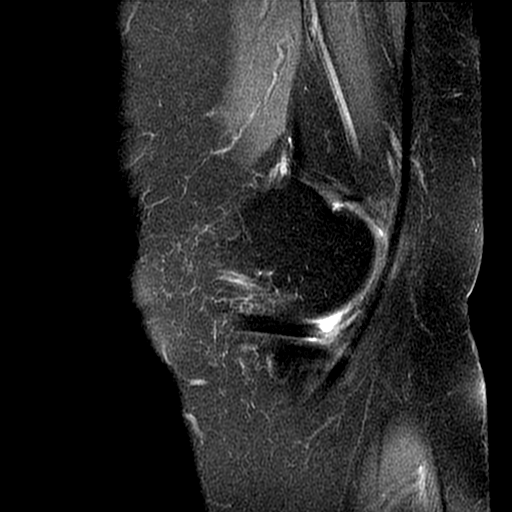
[im 10/19]
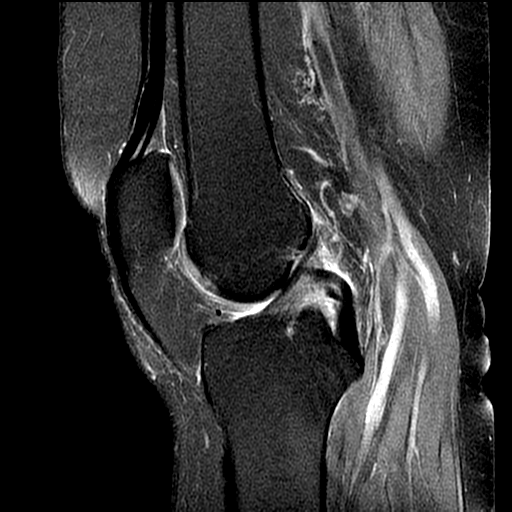
[im 16/19]
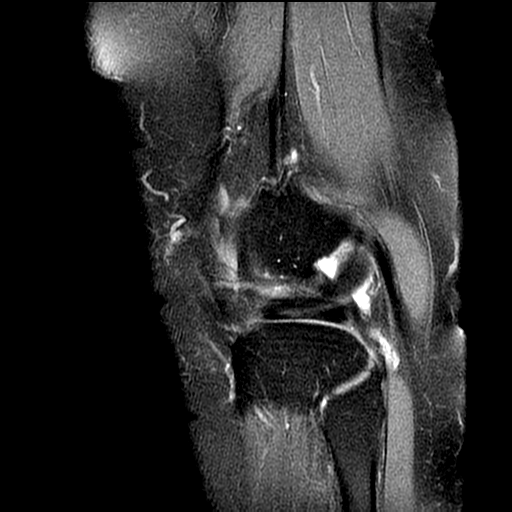

[19 of 40 positions shown; findings below may reference images not displayed]

FINDINGS: MENISCI

Medial meniscus:  Intact.

Lateral meniscus: Intrasubstance degeneration with linear horizontal
intrasubstance signal without definite extension to an articular
surface.

LIGAMENTS

Cruciates:  Intact ACL and PCL.

Collaterals: Medial collateral ligament is intact. Lateral
collateral ligament complex is intact.

CARTILAGE

Patellofemoral: Partial-thickness chondral fissuring within the
inferior trochlear groove (series 3, image 12). No patellar chondral
defect.

Medial:  No chondral defect.

Lateral:  No chondral defect.

Joint:  No joint effusion.  Fat pads within normal limits.

Popliteal Fossa:  Small Baker's cyst.  Intact popliteus tendon.

Extensor Mechanism:  Intact quadriceps tendon and patellar tendon.

Bones: No focal marrow signal abnormality. No fracture or
dislocation.

Other: None.
IMPRESSION: 1. Intrasubstance degeneration of the lateral meniscus without
definite tear.
2. Partial-thickness chondral fissuring within the central trochlear
groove inferiorly.
3. Small Baker's cyst.
4. Intact cruciate and collateral ligaments.

## 2023-06-27 ENCOUNTER — Ambulatory Visit (INDEPENDENT_AMBULATORY_CARE_PROVIDER_SITE_OTHER): Payer: Medicare PPO

## 2023-06-27 DIAGNOSIS — I442 Atrioventricular block, complete: Secondary | ICD-10-CM | POA: Diagnosis not present

## 2023-06-27 LAB — CUP PACEART REMOTE DEVICE CHECK
Battery Remaining Longevity: 45 mo
Battery Voltage: 2.95 V
Brady Statistic AP VP Percent: 3.45 %
Brady Statistic AP VS Percent: 0 %
Brady Statistic AS VP Percent: 96.54 %
Brady Statistic AS VS Percent: 0.01 %
Brady Statistic RA Percent Paced: 3.44 %
Brady Statistic RV Percent Paced: 99.99 %
Date Time Interrogation Session: 20240716010027
Implantable Lead Connection Status: 753985
Implantable Lead Connection Status: 753985
Implantable Lead Implant Date: 20181009
Implantable Lead Implant Date: 20181009
Implantable Lead Location: 753859
Implantable Lead Location: 753860
Implantable Lead Model: 5076
Implantable Lead Model: 5076
Implantable Pulse Generator Implant Date: 20181009
Lead Channel Impedance Value: 247 Ohm
Lead Channel Impedance Value: 304 Ohm
Lead Channel Impedance Value: 361 Ohm
Lead Channel Impedance Value: 418 Ohm
Lead Channel Pacing Threshold Amplitude: 0.75 V
Lead Channel Pacing Threshold Amplitude: 1 V
Lead Channel Pacing Threshold Pulse Width: 0.4 ms
Lead Channel Pacing Threshold Pulse Width: 0.4 ms
Lead Channel Sensing Intrinsic Amplitude: 2.5 mV
Lead Channel Sensing Intrinsic Amplitude: 2.5 mV
Lead Channel Sensing Intrinsic Amplitude: 5.25 mV
Lead Channel Setting Pacing Amplitude: 2.25 V
Lead Channel Setting Pacing Amplitude: 2.5 V
Lead Channel Setting Pacing Pulse Width: 0.4 ms
Lead Channel Setting Sensing Sensitivity: 2 mV
Zone Setting Status: 755011
Zone Setting Status: 755011

## 2023-06-29 ENCOUNTER — Ambulatory Visit: Payer: Medicare PPO | Attending: Internal Medicine | Admitting: Internal Medicine

## 2023-06-29 ENCOUNTER — Encounter: Payer: Self-pay | Admitting: Pharmacist

## 2023-06-29 ENCOUNTER — Encounter: Payer: Self-pay | Admitting: Internal Medicine

## 2023-06-29 VITALS — BP 120/74 | HR 99 | Ht 66.0 in | Wt 105.6 lb

## 2023-06-29 DIAGNOSIS — I442 Atrioventricular block, complete: Secondary | ICD-10-CM

## 2023-06-29 DIAGNOSIS — I4729 Other ventricular tachycardia: Secondary | ICD-10-CM | POA: Diagnosis not present

## 2023-06-29 DIAGNOSIS — I480 Paroxysmal atrial fibrillation: Secondary | ICD-10-CM

## 2023-06-29 LAB — CUP PACEART INCLINIC DEVICE CHECK
Battery Remaining Longevity: 45 mo
Battery Voltage: 2.95 V
Brady Statistic AP VP Percent: 29.26 %
Brady Statistic AP VS Percent: 0 %
Brady Statistic AS VP Percent: 70.74 %
Brady Statistic AS VS Percent: 0.01 %
Brady Statistic RA Percent Paced: 29.25 %
Brady Statistic RV Percent Paced: 99.99 %
Date Time Interrogation Session: 20240718173422
Implantable Lead Connection Status: 753985
Implantable Lead Connection Status: 753985
Implantable Lead Implant Date: 20181009
Implantable Lead Implant Date: 20181009
Implantable Lead Location: 753859
Implantable Lead Location: 753860
Implantable Lead Model: 5076
Implantable Lead Model: 5076
Implantable Pulse Generator Implant Date: 20181009
Lead Channel Impedance Value: 266 Ohm
Lead Channel Impedance Value: 323 Ohm
Lead Channel Impedance Value: 399 Ohm
Lead Channel Impedance Value: 437 Ohm
Lead Channel Pacing Threshold Amplitude: 0.75 V
Lead Channel Pacing Threshold Amplitude: 1 V
Lead Channel Pacing Threshold Pulse Width: 0.4 ms
Lead Channel Pacing Threshold Pulse Width: 0.4 ms
Lead Channel Sensing Intrinsic Amplitude: 2 mV
Lead Channel Sensing Intrinsic Amplitude: 2.5 mV
Lead Channel Sensing Intrinsic Amplitude: 5.25 mV
Lead Channel Setting Pacing Amplitude: 2 V
Lead Channel Setting Pacing Amplitude: 2.5 V
Lead Channel Setting Pacing Pulse Width: 0.4 ms
Lead Channel Setting Sensing Sensitivity: 2 mV
Zone Setting Status: 755011
Zone Setting Status: 755011

## 2023-06-29 NOTE — Progress Notes (Signed)
HPI  Patricia [name] follows up today for ongoing evaluation and management of her PPM, NSVT, remote atrial flutter s/p ablation, and prior MV replacement. Since she was seen last by Dr. Fawn Kirk a year ago, she had done well until a month or two ago and was found to have worsening CHF symptoms and a 2D echo and then TEE demonstrated surgical AI. She has undergone SAVR with Dr. Laneta Simmers. She has not had syncope. She is maintaining NSR. She has been on coumadin.     Current Outpatient Medications  Medication Sig Dispense Refill   levothyroxine (SYNTHROID) 75 MCG tablet Take 1 tablet (75 mcg total) by mouth daily. 90 tablet 3   acetaminophen (TYLENOL) 325 MG tablet Take 2 tablets (650 mg total) by mouth 2 (two) times daily as needed for moderate pain or headache.     alendronate (FOSAMAX) 70 MG tablet Take 1 tablet (70 mg total) by mouth once a week. 12 tablet 3   atorvastatin (LIPITOR) 20 MG tablet Take 1 tablet (20 mg total) by mouth daily. (Patient not taking: Reported on 04/20/2023) 30 tablet 6   Blood Glucose Monitoring Suppl (ACCU-CHEK GUIDE) w/Device KIT 1 Units by Does not apply route daily. 1 kit 0   Calcium Carbonate (CALCIUM 600 PO) Take 600 mg by mouth once. One a day     CREON 36000-114000 units CPEP capsule Take 36,000 Units by mouth 3 (three) times daily before meals.     esomeprazole (NEXIUM) 40 MG capsule Take 40 mg by mouth daily before breakfast.   12   fluticasone (FLONASE) 50 MCG/ACT nasal spray Place 2 sprays into both nostrils daily as needed for rhinitis. 16 g 1   glucose blood (ACCU-CHEK GUIDE) test strip USE TO TEST BLOOD SUGARS EVERY MORNING 100 strip 3   Lancets (ACCU-CHEK SOFT TOUCH) lancets Check BS QAM 100 each 3   Lancets Misc. (ACCU-CHEK SOFTCLIX LANCET DEV) KIT Check BS QAM 1 kit 0   metoprolol tartrate (LOPRESSOR) 100 MG tablet Take 1 tablet (100 mg total) by mouth 2 (two) times daily. 180 tablet 3   Multiple Vitamin (MULTIVITAMIN WITH MINERALS) TABS tablet Take 1  tablet by mouth daily with lunch. Centrum Silver.     torsemide (DEMADEX) 10 MG tablet Take 1 tablet (10 mg total) by mouth daily. 90 tablet 3   No current facility-administered medications for this visit.     Past Medical History:  Diagnosis Date   Atrial flutter (HCC)    Ablated 1998 Dr. Graciela Husbands   Basal cell carcinoma 06/05/2019   R low back, EDC 07/16/19   Basal cell carcinoma 04/15/2010   Right post shoulder   Basal cell carcinoma 04/15/2010   Mid back   Basal cell carcinoma (BCC) 09/29/2016   Left post shoulder. Superficial and nodular.   BCC (basal cell carcinoma) 07/21/2021   right mid back, [facility] 09/15/2021   BCC (basal cell carcinoma) 01/27/2022   left lower abdomen, EDC 07/28/22   BCC (basal cell carcinoma) 07/28/2022   right lateral forehead, EDC 08/10/22   BCC (basal cell carcinoma) 07/28/2022   left buttock, EDC 08/10/22   Bleeding gastric ulcer    back in 2000   Breast cancer (HCC)    COPD (chronic obstructive pulmonary disease) (HCC)    from radiation   GERD (gastroesophageal reflux disease)    History of basal cell carcinoma (BCC) 03/22/2021   right neck and right medial ear, Moh's   Hodgkin's lymphoma (HCC)  Treated with radiation and chemo   Mitral regurgitation    pvc   Osteoporosis    lumbar verterbral fracture, left ankle fracture, dexa 2015   Pancreatitis    Pneumonia    Prediabetes    Presence of permanent cardiac pacemaker    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 09/13/2017   25 mm Medtronic Mosaic porcine stented bioprosthetic tissue valve   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   Past Surgical History:  Procedure Laterality Date   AORTIC VALVE REPLACEMENT N/A 04/07/2022   Procedure: AORTIC VALVE REPLACEMENT  (AVR), USING INSPIRIS 19 MM AORTIC VALVE;  Surgeon: Alleen Borne, MD;  Location: [facility] OR;  Service: Open Heart Surgery;  Laterality: N/A;   BREAST SURGERY     breast cancer since 2005   left mastectomy   BUBBLE STUDY  01/12/2022   Procedure: BUBBLE STUDY;  Surgeon: Sande Rives, MD;  Location: [facility] ENDOSCOPY;  Service: Cardiovascular;;   CARDIAC ELECTROPHYSIOLOGY MAPPING AND ABLATION  1999   CARDIAC VALVE REPLACEMENT N/A    [employer] 03/19/2021   CATARACT EXTRACTION, BILATERAL     COLONOSCOPY     IR RADIOLOGY PERIPHERAL GUIDED IV START  08/03/2017   IR THORACENTESIS ASP PLEURAL SPACE W/IMG GUIDE  05/23/2022   IR THORACENTESIS ASP PLEURAL SPACE W/IMG GUIDE  05/24/2022   IR US GUIDE VASC ACCESS RIGHT  08/03/2017   LAPAROTOMY     MASTECTOMY  2005   Left-axillary   MITRAL VALVE REPAIR Right 09/13/2017   Procedure: MINIMALLY INVASIVE MITRAL VALVE REPLACEMENT (MVR) with Medtronic Mosaic Porcine Heart Valve size 25mm;  Surgeon: Purcell Nails, MD;  Location: [facility] OR;  Service: Open Heart Surgery;  Laterality: Right;   ORIF ANKLE FRACTURE Left 03/21/2014   Procedure: LEFT ANKLE FRACTURE OPEN TREATMENT BILMALLEOLAR INCLUDES INTERNAL FIXATION ;  Surgeon: Sheral Apley, MD;  Location: [facility];  Service: Orthopedics;  Laterality: Left;   PACEMAKER IMPLANT N/A 09/19/2017   Medtronic Azure XT MRI conditional dual-chamber pacemaker for symptomatic complete heart block  by Dr Johney Frame   RIGHT/LEFT HEART CATH AND CORONARY ANGIOGRAPHY N/A 06/27/2017   Procedure: Right/Left Heart Cath and Coronary Angiography;  Surgeon: Laurey Morale, MD;  Location: [facility] INVASIVE CV LAB;  Service: Cardiovascular;  Laterality: N/A;   SPLENECTOMY  1974   hodgkins disease   TEE WITHOUT CARDIOVERSION  06/12/2012   Procedure: TRANSESOPHAGEAL ECHOCARDIOGRAM (TEE);  Surgeon: Laurey Morale, MD;  Location: [facility] ENDOSCOPY;  Service: Cardiovascular;  Laterality: N/A;   TEE WITHOUT CARDIOVERSION N/A 06/29/2017    Procedure: TRANSESOPHAGEAL ECHOCARDIOGRAM (TEE);  Surgeon: Jake Bathe, MD;  Location: [facility] ENDOSCOPY;  Service: Cardiovascular;  Laterality: N/A;   TEE WITHOUT CARDIOVERSION N/A 09/13/2017   Procedure: TRANSESOPHAGEAL ECHOCARDIOGRAM (TEE);  Surgeon: Purcell Nails, MD;  Location: [facility] OR;  Service: Open Heart Surgery;  Laterality: N/A;   TEE WITHOUT CARDIOVERSION N/A 01/12/2022   Procedure: TRANSESOPHAGEAL ECHOCARDIOGRAM (TEE);  Surgeon: Sande Rives, MD;  Location: [facility] ENDOSCOPY;  Service: Cardiovascular;  Laterality: N/A;   TEE WITHOUT CARDIOVERSION N/A 04/07/2022   Procedure: TRANSESOPHAGEAL ECHOCARDIOGRAM (TEE);  Surgeon: Alleen Borne, MD;  Location: [facility] OR;  Service: Open Heart Surgery;  Laterality: N/A;   TONSILLECTOMY AND  ADENOIDECTOMY       Family History  Problem Relation Age of Onset   Rheumatic fever Father        Died suddenly age 15   Diabetes Father    Cancer Father        colon   Heart disease Father        rheumatic fever x 3   Arthritis Maternal Grandmother    Hypertension Maternal Grandmother    Diabetes Paternal Grandmother    Vision loss Paternal Grandmother        glaucoma     Social History   Socioeconomic History   Marital status: Widowed    Spouse name: Not on file   Number of children: Not on file   Years of education: Not on file   Highest education level: Master's degree (e.g., MA, MS, MEng, MEd, MSW, MBA)  Occupational History   Occupation: Runner, broadcasting/film/video (retired)  Tobacco Use   Smoking status: Never   Smokeless tobacco: Never  Vaping Use   Vaping status: Never Used  Substance and Sexual Activity   Alcohol use: No   Drug use: No   Sexual activity: Yes  Other Topics Concern   Not on file  Social History Narrative   Husband passed away from Cancer in 09/17/17.   Social Determinants of Health   Financial Resource Strain: Low Risk  (03/02/2023)   Overall Financial Resource Strain (CARDIA)    Difficulty of Paying Living Expenses: Not hard  at all  Food Insecurity: No Food Insecurity (03/02/2023)   Hunger Vital Sign    Worried About Running Out of Food in the Last Year: Never true    Ran Out of Food in the Last Year: Never true  Transportation Needs: No Transportation Needs (03/02/2023)   PRAPARE - Administrator, Civil Service (Medical): No    Lack of Transportation (Non-Medical): No  Physical Activity: Insufficiently Active (03/02/2023)   Exercise Vital Sign    Days of Exercise per Week: 5 days    Minutes of Exercise per Session: 20 min  Stress: No Stress Concern Present (03/02/2023)   [employer] of Occupational Health - Occupational Stress Questionnaire    Feeling of Stress : Only a little  Social Connections: Moderately Integrated (03/02/2023)   Social Connection and Isolation Panel [NHANES]    Frequency of Communication with Friends and Family: Twice a week    Frequency of Social Gatherings with Friends and Family: Once a week    Attends Religious Services: More than 4 times per year    Active Member of [employer] or Organizations: Yes    Attends Banker Meetings: More than 4 times per year    Marital Status: Widowed  Intimate Partner Violence: Not At Risk (12/06/2022)   Humiliation, Afraid, Rape, and Kick questionnaire    Fear of Current or Ex-Partner: No    Emotionally Abused: No    Physically Abused: No    Sexually Abused: No     BP 120/74   Pulse 99   Ht 5\' 6"  (1.676 m)   Wt 105 lb 9.6 oz (47.9 kg)   SpO2 98%   BMI 17.04 kg/m   Physical Exam:  Well appearing NAD HEENT: Unremarkable Neck:  No JVD, no thyromegally Lymphatics:  No adenopathy Back:  No CVA tenderness Lungs:  Clear HEART:  Regular rate rhythm, no murmurs, no rubs, no clicks Abd:  soft, positive bowel sounds, no organomegally, no rebound, no guarding Ext:  2 plus pulses,  no edema, no cyanosis, no clubbing Skin:  No rashes no nodules Neuro:  CN II through XII intact, motor grossly intact  EKG - nsr with p  synch ventricular pacing  DEVICE  Normal device function.  See PaceArt for details.   Assess/Plan: CHB - she is asymptomatic s/p PPM insertion AI - she is s/p SAVR and doing well.  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   Sharlot Gowda Henleigh Robello,MD

## 2023-06-29 NOTE — Patient Instructions (Signed)
Medication Instructions:  Your physician recommends that you continue on your current medications as directed. Please refer to the Current Medication list given to you today.  *If you need a refill on your cardiac medications before your next appointment, please call your pharmacy*   Lab Work: None ordered.  If you have labs (blood work) drawn today and your tests are completely normal, you will receive your results only by: MyChart Message (if you have MyChart) OR A paper copy in the mail If you have any lab test that is abnormal or we need to change your treatment, we will call you to review the results.   Testing/Procedures: None ordered.    Follow-Up: At [facility], you and your health needs are our priority.  As part of our continuing mission to provide you with exceptional heart care, we have created designated Provider Care Teams.  These Care Teams include your primary Cardiologist (physician) and Advanced Practice Providers (APPs -  Physician Assistants and Nurse Practitioners) who all work together to provide you with the care you need, when you need it.  We recommend signing up for the patient portal called "MyChart".  Sign up information is provided on this After Visit Summary.  MyChart is used to connect with patients for Virtual Visits (Telemedicine).  Patients are able to view lab/test results, encounter notes, upcoming appointments, etc.  Non-urgent messages can be sent to your provider as well.   To learn more about what you can do with MyChart, go to [email or web address].    Your next appointment:   12 months with Dr Ladona Ridgel

## 2023-06-29 NOTE — Progress Notes (Signed)
Patient previously followed by [facility] pharmacist. Per clinical review, no pharmacist appointment needed at this time.

## 2023-07-06 ENCOUNTER — Encounter: Payer: Medicare PPO | Admitting: Pharmacist

## 2023-07-10 ENCOUNTER — Other Ambulatory Visit: Payer: Medicare PPO

## 2023-07-10 DIAGNOSIS — R7989 Other specified abnormal findings of blood chemistry: Secondary | ICD-10-CM

## 2023-07-10 DIAGNOSIS — Z1329 Encounter for screening for other suspected endocrine disorder: Secondary | ICD-10-CM | POA: Diagnosis not present

## 2023-07-11 DIAGNOSIS — Z681 Body mass index (BMI) 19 or less, adult: Secondary | ICD-10-CM | POA: Diagnosis not present

## 2023-07-11 DIAGNOSIS — M47816 Spondylosis without myelopathy or radiculopathy, lumbar region: Secondary | ICD-10-CM | POA: Diagnosis not present

## 2023-07-13 ENCOUNTER — Encounter: Payer: Self-pay | Admitting: Dermatology

## 2023-07-13 ENCOUNTER — Ambulatory Visit: Payer: Medicare PPO | Admitting: Dermatology

## 2023-07-13 DIAGNOSIS — L820 Inflamed seborrheic keratosis: Secondary | ICD-10-CM | POA: Diagnosis not present

## 2023-07-13 DIAGNOSIS — D1801 Hemangioma of skin and subcutaneous tissue: Secondary | ICD-10-CM | POA: Diagnosis not present

## 2023-07-13 DIAGNOSIS — Z1283 Encounter for screening for malignant neoplasm of skin: Secondary | ICD-10-CM

## 2023-07-13 DIAGNOSIS — D485 Neoplasm of uncertain behavior of skin: Secondary | ICD-10-CM

## 2023-07-13 DIAGNOSIS — Z85828 Personal history of other malignant neoplasm of skin: Secondary | ICD-10-CM

## 2023-07-13 DIAGNOSIS — L578 Other skin changes due to chronic exposure to nonionizing radiation: Secondary | ICD-10-CM | POA: Diagnosis not present

## 2023-07-13 DIAGNOSIS — W908XXA Exposure to other nonionizing radiation, initial encounter: Secondary | ICD-10-CM | POA: Diagnosis not present

## 2023-07-13 DIAGNOSIS — C44619 Basal cell carcinoma of skin of left upper limb, including shoulder: Secondary | ICD-10-CM | POA: Diagnosis not present

## 2023-07-13 DIAGNOSIS — D692 Other nonthrombocytopenic purpura: Secondary | ICD-10-CM | POA: Diagnosis not present

## 2023-07-13 DIAGNOSIS — L821 Other seborrheic keratosis: Secondary | ICD-10-CM | POA: Diagnosis not present

## 2023-07-13 DIAGNOSIS — L814 Other melanin hyperpigmentation: Secondary | ICD-10-CM

## 2023-07-13 DIAGNOSIS — D229 Melanocytic nevi, unspecified: Secondary | ICD-10-CM

## 2023-07-13 DIAGNOSIS — Z8589 Personal history of malignant neoplasm of other organs and systems: Secondary | ICD-10-CM

## 2023-07-13 NOTE — Patient Instructions (Signed)
Wound Care Instructions  Cleanse wound gently with soap and water once a day then pat dry with clean gauze. Apply a thin coat of Petrolatum (petroleum jelly, "Vaseline") over the wound (unless you have an allergy to this). We recommend that you use a new, sterile tube of Vaseline. Do not pick or remove scabs. Do not remove the yellow or white "healing tissue" from the base of the wound.  Cover the wound with fresh, clean, nonstick gauze and secure with paper tape. You may use Band-Aids in place of gauze and tape if the wound is small enough, but would recommend trimming much of the tape off as there is often too much. Sometimes Band-Aids can irritate the skin.  You should call the office for your biopsy report after 1 week if you have not already been contacted.  If you experience any problems, such as abnormal amounts of bleeding, swelling, significant bruising, significant pain, or evidence of infection, please call the office immediately.  FOR ADULT SURGERY PATIENTS: If you need something for pain relief you may take 1 extra strength Tylenol (acetaminophen) AND 2 Ibuprofen (200mg  each) together every 4 hours as needed for pain. (do not take these if you are allergic to them or if you have a reason you should not take them.) Typically, you may only need pain medication for 1 to 3 days.    Melanoma ABCDEs  Melanoma is the most dangerous type of skin cancer, and is the leading cause of death from skin disease.  You are more likely to develop melanoma if you: Have light-colored skin, light-colored eyes, or red or blond hair Spend a lot of time in the sun Tan regularly, either outdoors or in a tanning bed Have had blistering sunburns, especially during childhood Have a close family member who has had a melanoma Have atypical moles or large birthmarks  Early detection of melanoma is key since treatment is typically straightforward and cure rates are extremely high if we catch it early.   The  first sign of melanoma is often a change in a mole or a new dark spot.  The ABCDE system is a way of remembering the signs of melanoma.  A for asymmetry:  The two halves do not match. B for border:  The edges of the growth are irregular. C for color:  A mixture of colors are present instead of an even brown color. D for diameter:  Melanomas are usually (but not always) greater than 6mm - the size of a pencil eraser. E for evolution:  The spot keeps changing in size, shape, and color.  Please check your skin once per month between visits. You can use a small mirror in front and a large mirror behind you to keep an eye on the back side or your body.   If you see any new or changing lesions before your next follow-up, please call to schedule a visit.  Please continue daily skin protection including broad spectrum sunscreen SPF 30+ to sun-exposed areas, reapplying every 2 hours as needed when you're outdoors.    Due to recent changes in healthcare laws, you may see results of your pathology and/or laboratory studies on MyChart before the doctors have had a chance to review them. We understand that in some cases there may be results that are confusing or concerning to you. Please understand that not all results are received at the same time and often the doctors may need to interpret multiple results in order to provide  you with the best plan of care or course of treatment. Therefore, we ask that you please give [place] 2 business days to thoroughly review all your results before contacting the office for clarification. Should we see a critical lab result, you will be contacted sooner.   If You Need Anything After Your Visit  If you have any questions or concerns for your doctor, please call our main line at [phone] and press option 4 to reach your doctor's medical assistant. If no one answers, please leave a voicemail as directed and we will return your call as soon as possible. Messages left after 4  pm will be answered the following business day.   You may also send [place] a message via MyChart. We typically respond to MyChart messages within 1-2 business days.  For prescription refills, please ask your pharmacy to contact our office. Our fax number is [phone].  If you have an urgent issue when the clinic is closed that cannot wait until the next business day, you can page your doctor at the number below.    Please note that while we do our best to be available for urgent issues outside of office hours, we are not available 24/7.   If you have an urgent issue and are unable to reach [place], you may choose to seek medical care at your doctor's office, retail clinic, urgent care center, or emergency room.  If you have a medical emergency, please immediately call 911 or go to the emergency department.  Pager Numbers  - Dr. Gwen Pounds: [phone]  - Dr. Roseanne Reno: [phone]  In the event of inclement weather, please call our main line at [phone] for an update on the status of any delays or closures.  Dermatology Medication Tips: Please keep the boxes that topical medications come in in order to help keep track of the instructions about where and how to use these. Pharmacies typically print the medication instructions only on the boxes and not directly on the medication tubes.   If your medication is too expensive, please contact our office at [phone] option 4 or send [place] a message through MyChart.   We are unable to tell what your co-pay for medications will be in advance as this is different depending on your insurance coverage. However, we may be able to find a substitute medication at lower cost or fill out paperwork to get insurance to cover a needed medication.   If a prior authorization is required to get your medication covered by your insurance company, please allow [place] 1-2 business days to complete this process.  Drug prices often vary depending on where the  prescription is filled and some pharmacies may offer cheaper prices.  The website www.goodrx.com contains coupons for medications through different pharmacies. The prices here do not account for what the cost may be with help from insurance (it may be cheaper with your insurance), but the website can give you the price if you did not use any insurance.  - You can print the associated coupon and take it with your prescription to the pharmacy.  - You may also stop by our office during regular business hours and pick up a GoodRx coupon card.  - If you need your prescription sent electronically to a different pharmacy, notify our office through [facility] or by phone at [phone] option 4.

## 2023-07-13 NOTE — Progress Notes (Signed)
Follow-Up Visit   Subjective  Patricia [name] is a 72 y.o. female who presents for the following: Skin Cancer Screening and Full Body Skin Exam  The patient presents for Total-Body Skin Exam (TBSE) for skin cancer screening and mole check. The patient has spots, moles and lesions to be evaluated, some may be new or changing and the patient may have concern these could be cancer.  Patient with hx of SCC and BCC.   The following portions of the chart were reviewed this encounter and updated as appropriate: medications, allergies, medical history  Review of Systems:  No other skin or systemic complaints except as noted in HPI or Assessment and Plan.  Objective  Well appearing patient in no apparent distress; mood and affect are within normal limits.  A full examination was performed including scalp, head, eyes, ears, nose, lips, neck, chest, axillae, abdomen, back, buttocks, bilateral upper extremities, bilateral lower extremities, hands, feet, fingers, toes, fingernails, and toenails. All findings within normal limits unless otherwise noted below.   Relevant physical exam findings are noted in the Assessment and Plan.  left deltoid x 1, right sideburn x 1, nose x 2 (4) Erythematous stuck-on, waxy papule or plaque  left top of shoulder Pink patch 1.1 cm       Assessment & Plan   SKIN CANCER SCREENING PERFORMED TODAY.  ACTINIC DAMAGE - Chronic condition, secondary to cumulative UV/sun exposure - diffuse scaly erythematous macules with underlying dyspigmentation - Recommend daily broad spectrum sunscreen SPF 30+ to sun-exposed areas, reapply every 2 hours as needed.  - Staying in the shade or wearing long sleeves, sun glasses (UVA+UVB protection) and wide brim hats (4-inch brim around the entire circumference of the hat) are also recommended for sun protection.  - Call for new or changing lesions.  LENTIGINES, SEBORRHEIC KERATOSES, HEMANGIOMAS - Benign normal skin  lesions - Benign-appearing - Call for any changes  MELANOCYTIC NEVI - Tan-brown and/or pink-flesh-colored symmetric macules and papules - Benign appearing on exam today - Observation - Call clinic for new or changing moles - Recommend daily use of broad spectrum spf 30+ sunscreen to sun-exposed areas.   HISTORY OF BASAL CELL CARCINOMA OF THE SKIN - No evidence of recurrence today - Recommend regular full body skin exams - Recommend daily broad spectrum sunscreen SPF 30+ to sun-exposed areas, reapply every 2 hours as needed.  - Call if any new or changing lesions are noted between office visits  HISTORY OF SQUAMOUS CELL CARCINOMA OF THE SKIN - No evidence of recurrence today - No lymphadenopathy - Recommend regular full body skin exams - Recommend daily broad spectrum sunscreen SPF 30+ to sun-exposed areas, reapply every 2 hours as needed.  - Call if any new or changing lesions are noted between office visits  Purpura - Chronic; persistent and recurrent.  Treatable, but not curable. - Violaceous macules and patches - Benign - Related to trauma, age, sun damage and/or use of blood thinners, chronic use of topical and/or oral steroids - Observe - Can use OTC arnica containing moisturizer such as Dermend Bruise Formula if desired - Call for worsening or other concerns    Inflamed seborrheic keratosis (4) left deltoid x 1, right sideburn x 1, nose x 2  Symptomatic, irritating, patient would like treated.  Benign-appearing.  Call clinic for new or changing lesions.    Destruction of lesion - left deltoid x 1, right sideburn x 1, nose x 2 (4)  Destruction method: cryotherapy   Informed  consent: discussed and consent obtained   Lesion destroyed using liquid nitrogen: Yes   Cryotherapy cycles:  2 Outcome: patient tolerated procedure well with no complications   Post-procedure details: wound care instructions given    Neoplasm of uncertain behavior of skin left top of  shoulder  Skin / nail biopsy Type of biopsy: tangential   Informed consent: discussed and consent obtained   Timeout: patient name, date of birth, surgical site, and procedure verified   Procedure prep:  Patient was prepped and draped in usual sterile fashion Prep type:  Isopropyl alcohol Anesthesia: the lesion was anesthetized in a standard fashion   Anesthetic:  1% lidocaine w/ epinephrine 1-100,000 buffered w/ 8.4% NaHCO3 Instrument used: flexible razor blade   Hemostasis achieved with: pressure, aluminum chloride and electrodesiccation   Outcome: patient tolerated procedure well   Post-procedure details: sterile dressing applied and wound care instructions given   Dressing type: bandage and petrolatum    Specimen 1 - Surgical pathology Differential Diagnosis: r/o BCC vs ISK  Check Margins: No Pink patch 1.1 cm   Return in about 6 months (around 01/13/2024) for TBSE, Hx BCC, Hx SCC.  Anise Salvo, RMA, am acting as scribe for Armida Sans, MD .   Documentation: I have reviewed the above documentation for accuracy and completeness, and I agree with the above.  Armida Sans, MD

## 2023-07-13 NOTE — Progress Notes (Signed)
Remote pacemaker transmission.   

## 2023-07-17 ENCOUNTER — Other Ambulatory Visit: Payer: Self-pay

## 2023-07-17 ENCOUNTER — Telehealth: Payer: Self-pay

## 2023-07-17 ENCOUNTER — Other Ambulatory Visit: Payer: Self-pay | Admitting: Family Medicine

## 2023-07-17 DIAGNOSIS — M810 Age-related osteoporosis without current pathological fracture: Secondary | ICD-10-CM

## 2023-07-17 NOTE — Telephone Encounter (Signed)
Pt called in asking if the orders for her bone density exam could be sent to [facility]. Pt states that she has a mammogram scheduled there in September and can also get the bone density exam there if the orders from pcp were sent to that office. Pt states if there are any questions, please feel to call her at number listed in chart.   Cb#: [phone]

## 2023-07-18 ENCOUNTER — Encounter: Payer: Self-pay | Admitting: Dermatology

## 2023-07-18 NOTE — Telephone Encounter (Signed)
Last OV 04/11/23, within protocol.  Requested Prescriptions  Pending Prescriptions Disp Refills   fluticasone (FLONASE) 50 MCG/ACT nasal spray [Pharmacy Med Name: FLUTICASONE PROPIONATE SUSPENSION] 16 mL 0    Sig: PLACE TWO SPRAYS INTO EACH NOSTRIL DAILY     Ear, Nose, and Throat: Nasal Preparations - Corticosteroids Failed - 07/17/2023 11:19 AM      Failed - Valid encounter within last 12 months    Recent Outpatient Visits           1 year ago Dyspnea, unspecified type   [facility] Donita Brooks, MD   1 year ago Bilateral lower extremity edema   [facility] Valentino Nose, NP   1 year ago Subacute cough   [facility] Donita Brooks, MD   2 years ago Acute right-sided low back pain without sciatica   [facility] Donita Brooks, MD   3 years ago Postmenopausal estrogen deficiency   [facility] Pickard, Priscille Heidelberg, MD       Future Appointments             In 3 months Rollene Rotunda, MD [facility]   In 6 months Deirdre Evener, MD [facility]

## 2023-07-26 DIAGNOSIS — M47816 Spondylosis without myelopathy or radiculopathy, lumbar region: Secondary | ICD-10-CM | POA: Diagnosis not present

## 2023-07-27 ENCOUNTER — Telehealth: Payer: Self-pay

## 2023-07-27 NOTE — Telephone Encounter (Signed)
-----   Message from Armida Sans sent at 07/20/2023  5:21 PM EDT ----- Diagnosis Skin , left top of shoulder SUPERFICIAL BASAL CELL CARCINOMA  Cancer = BCC Superficial Schedule for [facility]

## 2023-07-27 NOTE — Telephone Encounter (Signed)
Advised pt of bx results/sh ?

## 2023-08-02 DIAGNOSIS — M47816 Spondylosis without myelopathy or radiculopathy, lumbar region: Secondary | ICD-10-CM | POA: Diagnosis not present

## 2023-08-03 DIAGNOSIS — M47816 Spondylosis without myelopathy or radiculopathy, lumbar region: Secondary | ICD-10-CM | POA: Diagnosis not present

## 2023-08-04 DIAGNOSIS — J3801 Paralysis of vocal cords and larynx, unilateral: Secondary | ICD-10-CM | POA: Diagnosis not present

## 2023-08-04 DIAGNOSIS — R490 Dysphonia: Secondary | ICD-10-CM | POA: Diagnosis not present

## 2023-08-04 DIAGNOSIS — R1314 Dysphagia, pharyngoesophageal phase: Secondary | ICD-10-CM | POA: Diagnosis not present

## 2023-08-07 DIAGNOSIS — M47816 Spondylosis without myelopathy or radiculopathy, lumbar region: Secondary | ICD-10-CM | POA: Diagnosis not present

## 2023-08-08 ENCOUNTER — Telehealth: Payer: Self-pay | Admitting: *Deleted

## 2023-08-08 ENCOUNTER — Encounter: Payer: Self-pay | Admitting: Internal Medicine

## 2023-08-08 ENCOUNTER — Other Ambulatory Visit: Payer: Self-pay | Admitting: Otolaryngology

## 2023-08-08 NOTE — Progress Notes (Signed)
PERIOPERATIVE PRESCRIPTION FOR IMPLANTED CARDIAC DEVICE PROGRAMMING  Patient Information: Name:  DEEANDRA HELING  DOB:  05-30-1951  MRN:  [id number]    Planned Procedure: SUSPENSION MICROLARYNGOSCOPY WITH INJECTION; LARYNGOPLASTY VOCAL FOLD   Surgeon:  Dr. Bud Face, MD  Requesting device clearance: Quentin Mulling, FNP-C  Date of Procedure:  08/22/2023  Cautery will be used.   Please route documentation back me via [facility], or may fax report to [facility] PAT APP at [phone].   Device Information:  Clinic EP Physician:  Lewayne Bunting, MD   Device Type:  Pacemaker Manufacturer and Phone #:  Medtronic: [id number] Pacemaker Dependent?:  Yes.   Date of Last Device Check:  06/29/2023 Normal Device Function?:  Yes.    Electrophysiologist's Recommendations:  Have magnet available. Provide continuous ECG monitoring when magnet is used or reprogramming is to be performed.  Procedure will likely interfere with device function.  Device should be programmed:  Asynchronous pacing during procedure and returned to normal programming after procedure  Per Device Clinic Standing Orders, Lenor Coffin, RN  9:19 AM 08/08/2023

## 2023-08-08 NOTE — Telephone Encounter (Signed)
-----   Message from Verlee Monte sent at 08/08/2023  9:15 AM EDT ----- Regarding: Request for pre-operative cardiac clearance Request for pre-operative cardiac clearance:  1. What type of surgery is being performed?  SUSPENSION MICROLARYNGOSCOPY WITH INJECTION; LARYNGOPLASTY VOCAL FOLD   2. When is this surgery scheduled?  08/22/2023  3. Type of clearance being requested (medical, pharmacy, both)? MEDICAL   4. Are there any medications that need to be held prior to surgery? NONE  5. Practice name and name of physician performing surgery?  Performing surgeon: Dr. Bud Face, MD Requesting clearance: Quentin Mulling, FNP-C    6. Anesthesia type (none, local, MAC, general)? GENERAL  7. What is the office phone and fax number?   Fax: [phone]  ATTENTION: Unable to create telephone message as per your standard workflow. Directed by [facility] providers to send requests for cardiac clearance to this pool for appropriate distribution to provider covering pre-operative clearances.   Quentin Mulling, MSN, APRN, FNP-C, CEN [facility]  Peri-operative Services Nurse Practitioner Phone: [phone] 08/08/23 9:15 AM

## 2023-08-08 NOTE — Telephone Encounter (Signed)
I s/w the pt and she has been scheduled for tele pre op appt 08/17/23. Med rec and consent are done.

## 2023-08-08 NOTE — Telephone Encounter (Signed)
I s/w the pt and she has been scheduled for tele pre op appt 08/17/23. Med rec and consent are done.     Patient Consent for Virtual Visit        Patricia [name] has provided verbal consent on 08/08/2023 for a virtual visit (video or telephone).   CONSENT FOR VIRTUAL VISIT FOR:  Patricia [name]  By participating in this virtual visit I agree to the following:  I hereby voluntarily request, consent and authorize [facility] and its employed or contracted physicians, physician assistants, nurse practitioners or other licensed health care professionals (the Practitioner), to provide me with telemedicine health care services (the "Services") as deemed necessary by the treating Practitioner. I acknowledge and consent to receive the Services by the Practitioner via telemedicine. I understand that the telemedicine visit will involve communicating with the Practitioner through live audiovisual communication technology and the disclosure of certain medical information by electronic transmission. I acknowledge that I have been given the opportunity to request an in-person assessment or other available alternative prior to the telemedicine visit and am voluntarily participating in the telemedicine visit.  I understand that I have the right to withhold or withdraw my consent to the use of telemedicine in the course of my care at any time, without affecting my right to future care or treatment, and that the Practitioner or I may terminate the telemedicine visit at any time. I understand that I have the right to inspect all information obtained and/or recorded in the course of the telemedicine visit and may receive copies of available information for a reasonable fee.  I understand that some of the potential risks of receiving the Services via telemedicine include:  Delay or interruption in medical evaluation due to technological equipment failure or disruption; Information transmitted may not be  sufficient (e.g. poor resolution of images) to allow for appropriate medical decision making by the Practitioner; and/or  In rare instances, security protocols could fail, causing a breach of personal health information.  Furthermore, I acknowledge that it is my responsibility to provide information about my medical history, conditions and care that is complete and accurate to the best of my ability. I acknowledge that Practitioner's advice, recommendations, and/or decision may be based on factors not within their control, such as incomplete or inaccurate data provided by me or distortions of diagnostic images or specimens that may result from electronic transmissions. I understand that the practice of medicine is not an exact science and that Practitioner makes no warranties or guarantees regarding treatment outcomes. I acknowledge that a copy of this consent can be made available to me via my patient portal [facility] MyChart), or I can request a printed copy by calling the office of [facility].    I understand that my insurance will be billed for this visit.   I have read or had this consent read to me. I understand the contents of this consent, which adequately explains the benefits and risks of the Services being provided via telemedicine.  I have been provided ample opportunity to ask questions regarding this consent and the Services and have had my questions answered to my satisfaction. I give my informed consent for the services to be provided through the use of telemedicine in my medical care

## 2023-08-08 NOTE — Telephone Encounter (Signed)
   Name: Patricia [name]  DOB: 03/15/1951  MRN: [id number]  Primary Cardiologist: Rollene Rotunda, MD   Preoperative team, please contact this patient and set up a phone call appointment for further preoperative risk assessment. Please obtain consent and complete medication review. Thank you for your help.  I confirm that guidance regarding antiplatelet and oral anticoagulation therapy has been completed and, if necessary, noted below.  None requested   Carlos Levering, NP 08/08/2023, 1:26 PM [facility]

## 2023-08-09 DIAGNOSIS — M47816 Spondylosis without myelopathy or radiculopathy, lumbar region: Secondary | ICD-10-CM | POA: Diagnosis not present

## 2023-08-15 ENCOUNTER — Other Ambulatory Visit: Payer: Self-pay | Admitting: Family Medicine

## 2023-08-15 ENCOUNTER — Inpatient Hospital Stay: Admission: RE | Admit: 2023-08-15 | Payer: Medicare PPO | Source: Ambulatory Visit

## 2023-08-15 DIAGNOSIS — Z1231 Encounter for screening mammogram for malignant neoplasm of breast: Secondary | ICD-10-CM | POA: Diagnosis not present

## 2023-08-15 DIAGNOSIS — M47816 Spondylosis without myelopathy or radiculopathy, lumbar region: Secondary | ICD-10-CM | POA: Diagnosis not present

## 2023-08-15 LAB — HM MAMMOGRAPHY

## 2023-08-15 NOTE — Progress Notes (Unsigned)
Virtual Visit via Telephone Note   Because of Patricia [name]'s co-morbid illnesses, she is at least at moderate risk for complications without adequate follow up.  This format is felt to be most appropriate for this patient at this time.  The patient did not have access to video technology/had technical difficulties with video requiring transitioning to audio format only (telephone).  All issues noted in this document were discussed and addressed.  No physical exam could be performed with this format.  Please refer to the patient's chart for her consent to telehealth for [facility].  Evaluation Performed:  Preoperative cardiovascular risk assessment _____________   Date:  08/17/2023   Patient ID:  Patricia [name], DOB 1951/07/22, MRN [id number] Patient Location:  Home Provider location:   Office  Primary Care Provider:  Donita Brooks, MD Primary Cardiologist:  Rollene Rotunda, MD  Chief Complaint / Patient Profile   72 y.o. y/o female with a h/o mitral valve replacement with 25 mm Medtronic Mosaic porcine valve in October 2018, atrial flutter ablation, severe AI status post AVR, postoperative atrial fibrillation.  She has a history of atrial flutter status post ablation, she is not on any anticoagulation.  Symptomatic heart block with pacemaker in situ.   She is pending suspension microlaryngoscopy with injection, laryngoplasty vocal fold, by Dr. Andee Poles on 08/22/2023.  And presents today for telephonic preoperative cardiovascular risk assessment.  History of Present Illness    Patricia [name] is a 72 y.o. female who presents via audio/video conferencing for a telehealth visit today.  Pt was last seen in cardiology clinic on 04/20/2023 by Dr. Antoine Poche.  At that time Patricia [name] was doing well .  The patient is now pending procedure as outlined above. Since her last visit, she has been doing well. She continues to get regular exercise at least 3 days per week biking,  walking, housework. 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 She has chronic stable shortness of breath with very heavy exertion. She otherwise denies chest pain, lower extremity edema, fatigue, palpitations, melena, hematuria, hemoptysis, diaphoresis, weakness, presyncope, syncope, orthopnea, and PND.   Past Medical History    Past Medical History:  Diagnosis Date   Abnormal stress test    Acute pancreatitis    AKI (acute kidney injury) (HCC)    Anemia    Aortic atherosclerosis (HCC)    Aortic valve regurgitation    Atrial flutter (HCC)    Ablated 1998 Dr. Graciela Husbands   Atrial flutter [facility])    Basal cell carcinoma 06/05/2019   R low back, EDC 07/16/19   Basal cell carcinoma 04/15/2010   Right post shoulder   Basal cell carcinoma 04/15/2010   Mid back   Basal cell carcinoma 07/13/2023   left top of shoulder, needs EDC   Basal cell carcinoma (BCC) 09/29/2016   Left post shoulder. Superficial and nodular.   BCC (basal cell carcinoma) 07/21/2021   right mid back, [facility] 09/15/2021   BCC (basal cell carcinoma) 01/27/2022   left lower abdomen, EDC 07/28/22   BCC (basal cell carcinoma) 07/28/2022   right lateral forehead, EDC 08/10/22   BCC (basal cell carcinoma) 07/28/2022   left buttock, EDC 08/10/22   Bilateral pleural effusion    Bleeding gastric ulcer    back in 2000   Breast cancer (HCC)    CHB (complete heart block) (HCC)    COPD (chronic obstructive pulmonary disease) (HCC)  from radiation   Dyspnea    GERD (gastroesophageal reflux disease)    History of basal cell carcinoma (BCC) 03/22/2021   right neck and right medial ear, Moh's   History of blood transfusion    Hodgkin's lymphoma (HCC)    Treated with radiation and chemo   Hyperkalemia    Hyponatremia    Hypothyroidism    Lower leg edema    Mitral regurgitation    pvc   Mitral valve stenosis    NSVT (nonsustained ventricular tachycardia) (HCC)     Osteoporosis    lumbar verterbral fracture, left ankle fracture, dexa 2015   PAF (paroxysmal atrial fibrillation) (HCC)    Pancreatitis    Pneumonia    Pre-diabetes    Presence of permanent cardiac pacemaker    Pulmonary HTN (HCC)    S/P aortic valve replacement    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 09/13/2017   25 mm Medtronic Mosaic porcine stented bioprosthetic tissue valve   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   Left medial lower leg   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   right anterior thigh, EDC 08/10/22   Syncope    Tachycardia    Ulcer    Past Surgical History:  Procedure Laterality Date   AORTIC VALVE REPLACEMENT N/A 04/07/2022   Procedure: AORTIC VALVE REPLACEMENT (AVR), USING INSPIRIS 19 MM AORTIC VALVE;  Surgeon: Alleen Borne, MD;  Location: [facility] OR;  Service: Open Heart Surgery;  Laterality: N/A;   ATRIAL FLUTTER ABLATION     BREAST SURGERY     breast cancer since 2005   left mastectomy   BUBBLE STUDY  01/12/2022   Procedure: BUBBLE STUDY;  Surgeon: Sande Rives, MD;  Location: [facility] ENDOSCOPY;  Service: Cardiovascular;;   CARDIAC ELECTROPHYSIOLOGY MAPPING AND ABLATION  1999   CARDIAC VALVE REPLACEMENT N/A    [employer] 03/19/2021   CATARACT EXTRACTION, BILATERAL     COLONOSCOPY     IR RADIOLOGY PERIPHERAL GUIDED IV START  08/03/2017   IR THORACENTESIS ASP PLEURAL SPACE W/IMG GUIDE  05/23/2022   IR THORACENTESIS ASP PLEURAL SPACE W/IMG GUIDE  05/24/2022   IR US GUIDE VASC ACCESS RIGHT  08/03/2017   LAPAROTOMY     MASTECTOMY  2005   Left-axillary   MITRAL VALVE REPAIR Right 09/13/2017   Procedure: MINIMALLY INVASIVE MITRAL VALVE REPLACEMENT (MVR) with Medtronic Mosaic Porcine Heart Valve size 25mm;  Surgeon: Purcell Nails, MD;  Location: [facility] OR;  Service: Open Heart Surgery;  Laterality: Right;   ORIF ANKLE FRACTURE Left 03/21/2014   Procedure: LEFT ANKLE FRACTURE OPEN TREATMENT BILMALLEOLAR  INCLUDES INTERNAL FIXATION ;  Surgeon: Sheral Apley, MD;  Location: [facility];  Service: Orthopedics;  Laterality: Left;   PACEMAKER IMPLANT N/A 09/19/2017   Medtronic Azure XT MRI conditional dual-chamber pacemaker for symptomatic complete heart block  by Dr Johney Frame   RIGHT/LEFT HEART CATH AND CORONARY ANGIOGRAPHY N/A 06/27/2017   Procedure: Right/Left Heart Cath and Coronary Angiography;  Surgeon: Laurey Morale, MD;  Location: [facility] INVASIVE CV LAB;  Service: Cardiovascular;  Laterality: N/A;   SPLENECTOMY  1974   hodgkins disease   TEE WITHOUT CARDIOVERSION  06/12/2012   Procedure: TRANSESOPHAGEAL ECHOCARDIOGRAM (TEE);  Surgeon: Laurey Morale, MD;  Location: [facility] ENDOSCOPY;  Service: Cardiovascular;  Laterality: N/A;   TEE WITHOUT CARDIOVERSION N/A 06/29/2017   Procedure: TRANSESOPHAGEAL ECHOCARDIOGRAM (TEE);  Surgeon: Jake Bathe, MD;  Location: [facility] ENDOSCOPY;  Service: Cardiovascular;  Laterality: N/A;   TEE WITHOUT CARDIOVERSION N/A 09/13/2017   Procedure: TRANSESOPHAGEAL ECHOCARDIOGRAM (TEE);  Surgeon: Purcell Nails, MD;  Location: [facility] OR;  Service: Open Heart Surgery;  Laterality: N/A;   TEE WITHOUT CARDIOVERSION N/A 01/12/2022   Procedure: TRANSESOPHAGEAL ECHOCARDIOGRAM (TEE);  Surgeon: Sande Rives, MD;  Location: [facility] ENDOSCOPY;  Service: Cardiovascular;  Laterality: N/A;   TEE WITHOUT CARDIOVERSION N/A 04/07/2022   Procedure: TRANSESOPHAGEAL ECHOCARDIOGRAM (TEE);  Surgeon: Alleen Borne, MD;  Location: [facility] OR;  Service: Open Heart Surgery;  Laterality: N/A;   TONSILLECTOMY AND ADENOIDECTOMY      Allergies  Allergies  Allergen Reactions   Aspirin Other (See Comments)    Past bleeding ulcer. GI upset   Azithromycin Other (See Comments)    pancreatitis   Penicillins Anaphylaxis, Hives, Swelling and Other (See Comments)    PATIENT HAS HAD A PCN REACTION WITH IMMEDIATE RASH, FACIAL/TONGUE/THROAT SWELLING, SOB, OR LIGHTHEADEDNESS WITH HYPOTENSION:  #   #  #  YES  #  #  #   HAS PT DEVELOPED SEVERE RASH INVOLVING MUCUS MEMBRANES or SKIN NECROSIS: #  #  #  YES  #  #  #  Has patient had a PCN reaction that required hospitalization: No Has patient had a PCN reaction occurring within the last 10 years: No    Sulfa Antibiotics Hives and Rash   Cheese Nausea And Vomiting    Home Medications    Prior to Admission medications   Medication Sig Start Date End Date Taking? Authorizing Provider  acetaminophen (TYLENOL) 325 MG tablet Take 2 tablets (650 mg total) by mouth 2 (two) times daily as needed for moderate pain or headache. 09/21/17   Ardelle Balls, PA-C  alendronate (FOSAMAX) 70 MG tablet Take 1 tablet (70 mg total) by mouth once a week. 12/28/22   Donita Brooks, MD  atorvastatin (LIPITOR) 20 MG tablet Take 1 tablet (20 mg total) by mouth daily. Patient not taking: Reported on 04/20/2023 10/07/22   Rollene Rotunda, MD  Blood Glucose Monitoring Suppl (ACCU-CHEK GUIDE) w/Device KIT 1 Units by Does not apply route daily. 12/25/19   Donita Brooks, MD  Calcium Carbonate (CALCIUM 600 PO) Take 600 mg by mouth once. One a day    [provider]  CREON 36000-114000 units CPEP capsule Take 36,000 Units by mouth 3 (three) times daily before meals. 12/23/21   [provider]  esomeprazole (NEXIUM) 40 MG capsule Take 40 mg by mouth daily before breakfast.  07/02/15   [provider]  fluticasone (FLONASE) 50 MCG/ACT nasal spray PLACE TWO SPRAYS INTO EACH NOSTRIL DAILY 07/18/23   Donita Brooks, MD  glucose blood (ACCU-CHEK GUIDE) test strip USE TO TEST BLOOD SUGARS EVERY MORNING 03/21/23   Donita Brooks, MD  Lancets (ACCU-CHEK SOFT TOUCH) lancets Check BS QAM 07/08/20   Donita Brooks, MD  Lancets Misc. (ACCU-CHEK SOFTCLIX LANCET DEV) KIT Check BS QAM 12/25/19   Donita Brooks, MD  levothyroxine (SYNTHROID) 75 MCG tablet Take 1 tablet (75 mcg total) by mouth daily. 05/10/23   Donita Brooks, MD  metoprolol  tartrate (LOPRESSOR) 100 MG tablet Take 1 tablet (100 mg total) by mouth 2 (two) times daily. 07/21/22   Rollene Rotunda, MD  Multiple Vitamin (MULTIVITAMIN WITH MINERALS) TABS tablet Take 1 tablet by mouth daily with lunch. Centrum Silver.    [provider]  torsemide (DEMADEX) 10 MG tablet Take 1 tablet (10 mg total) by mouth daily. 04/20/23  Rollene Rotunda, MD    Physical Exam    Vital Signs:  Patricia [name] does not have vital signs available for review today.  Given telephonic nature of communication, physical exam is limited. AAOx3. NAD. Normal affect.  Speech and respirations are unlabored.  Accessory Clinical Findings    None  Assessment & Plan    1.  Preoperative Cardiovascular Risk Assessment:  According to the Revised Cardiac Risk Index (RCRI), her Perioperative Risk of Major Cardiac Event is (%): 0.9  Her Functional Capacity in METs is: 6.36 according to the [facility] Activity Status Index (DASI).   The patient was advised that if she develops new symptoms prior to surgery to contact our office to arrange for a follow-up visit, and she verbalized understanding.  Therefore, based on ACC/AHA guidelines, patient would be at acceptable risk for the planned procedure without further cardiovascular testing. I will route this recommendation to the requesting party via Epic fax function.   A copy of this note will be routed to requesting surgeon.  Time:   Today, I have spent 20 minutes with the patient with telehealth technology discussing medical history, symptoms, and management plan.     Perlie Gold, PA-C  08/17/2023, 2:04 PM

## 2023-08-16 NOTE — Telephone Encounter (Signed)
Request is too soon, last refill 07/18/23.  Requested Prescriptions  Pending Prescriptions Disp Refills   fluticasone (FLONASE) 50 MCG/ACT nasal spray [Pharmacy Med Name: FLUTICASONE PROPIONATE SUSPENSION] 16 mL 0    Sig: PLACE TWO SPRAYS INTO EACH NOSTRIL DAILY     Ear, Nose, and Throat: Nasal Preparations - Corticosteroids Failed - 08/15/2023  8:29 AM      Failed - Valid encounter within last 12 months    Recent Outpatient Visits           1 year ago Dyspnea, unspecified type   [facility] Donita Brooks, MD   1 year ago Bilateral lower extremity edema   [facility] Valentino Nose, NP   1 year ago Subacute cough   [facility] Donita Brooks, MD   2 years ago Acute right-sided low back pain without sciatica   [facility] Donita Brooks, MD   3 years ago Postmenopausal estrogen deficiency   [facility] Pickard, Priscille Heidelberg, MD       Future Appointments             In 2 months Rollene Rotunda, MD [facility]   In 5 months Deirdre Evener, MD [facility]

## 2023-08-17 ENCOUNTER — Ambulatory Visit: Payer: Medicare PPO | Attending: Internal Medicine

## 2023-08-17 ENCOUNTER — Encounter: Payer: Self-pay | Admitting: Internal Medicine

## 2023-08-17 ENCOUNTER — Encounter
Admission: RE | Admit: 2023-08-17 | Discharge: 2023-08-17 | Disposition: A | Discharge status: Home / Self Care | Payer: Medicare PPO | Source: Ambulatory Visit | Attending: Otolaryngology | Admitting: Otolaryngology

## 2023-08-17 VITALS — Ht 66.0 in | Wt 104.0 lb

## 2023-08-17 DIAGNOSIS — Z01812 Encounter for preprocedural laboratory examination: Secondary | ICD-10-CM

## 2023-08-17 DIAGNOSIS — J449 Chronic obstructive pulmonary disease, unspecified: Secondary | ICD-10-CM

## 2023-08-17 DIAGNOSIS — I272 Pulmonary hypertension, unspecified: Secondary | ICD-10-CM

## 2023-08-17 DIAGNOSIS — I10 Essential (primary) hypertension: Secondary | ICD-10-CM

## 2023-08-17 HISTORY — DX: Pulmonary hypertension, unspecified: I27.20

## 2023-08-17 HISTORY — DX: Rheumatic mitral stenosis: I05.0

## 2023-08-17 HISTORY — DX: Pleural effusion, not elsewhere classified: J90

## 2023-08-17 HISTORY — DX: Syncope and collapse: R55

## 2023-08-17 HISTORY — DX: Dyspnea, unspecified: R06.00

## 2023-08-17 HISTORY — DX: Personal history of other medical treatment: Z92.89

## 2023-08-17 HISTORY — DX: Tachycardia, unspecified: R00.0

## 2023-08-17 HISTORY — DX: Other ventricular tachycardia: I47.29

## 2023-08-17 HISTORY — DX: Anemia, unspecified: D64.9

## 2023-08-17 HISTORY — DX: Hypothyroidism, unspecified: E03.9

## 2023-08-17 HISTORY — DX: Atrioventricular block, complete: I44.2

## 2023-08-17 HISTORY — DX: Localized edema: R60.0

## 2023-08-17 HISTORY — DX: Atherosclerosis of aorta: I70.0

## 2023-08-17 NOTE — Patient Instructions (Signed)
Your procedure is scheduled on:08-22-23 Tuesday Report to the Registration Desk on the 1st floor of the Medical Mall.Then proceed to the 2nd floor Surgery Desk To find out your arrival time, please call [phone] between 1PM - 3PM on:08-21-23 Monday If your arrival time is 6:00 am, do not arrive before that time as the Medical Mall entrance doors do not open until 6:00 am.  REMEMBER: Instructions that are not followed completely may result in serious medical risk, up to and including death; or upon the discretion of your surgeon and anesthesiologist your surgery may need to be rescheduled.  Do not eat food after midnight the night before surgery.  No gum chewing or hard candies.  You may however, drink CLEAR liquids up to 2 hours before you are scheduled to arrive for your surgery. Do not drink anything within 2 hours of your scheduled arrival time.  Clear liquids include: - water  - apple juice without pulp - gatorade (not RED colors) - black coffee or tea (Do NOT add milk or creamers to the coffee or tea) Do NOT drink anything that is not on this list.  One week prior to surgery: Stop Anti-inflammatories (NSAIDS) such as Advil, Aleve, Ibuprofen, Motrin, Naproxen, Naprosyn and Aspirin based products such as Excedrin, Goody's Powder, BC Powder.You may however, continue to take Tylenol if needed for pain up until the day of surgery. Stop ANY OVER THE COUNTER supplements/vitamins until after surgery (Last dose of Calcium, Multivitamin was on 08-13-23)   Continue taking all prescribed medications  TAKE ONLY THESE MEDICATIONS THE MORNING OF SURGERY WITH A SIP OF WATER: -evothyroxine (SYNTHROID)  -metoprolol tartrate (LOPRESSOR)  -esomeprazole (NEXIUM)-take one the night before and one on the morning of surgery - helps to prevent nausea after surgery.)  No Alcohol for 24 hours before or after surgery.  No Smoking including e-cigarettes for 24 hours before surgery.  No chewable tobacco  products for at least 6 hours before surgery.  No nicotine patches on the day of surgery.  Do not use any "recreational" drugs for at least a week (preferably 2 weeks) before your surgery.  Please be advised that the combination of cocaine and anesthesia may have negative outcomes, up to and including death. If you test positive for cocaine, your surgery will be cancelled.  On the morning of surgery brush your teeth with toothpaste and water, you may rinse your mouth with mouthwash if you wish. Do not swallow any toothpaste or mouthwash.  Do not wear jewelry, make-up, hairpins, clips or nail polish.  Do not wear lotions, powders, or perfumes.   Do not shave body hair from the neck down 48 hours before surgery.  Contact lenses, hearing aids and dentures may not be worn into surgery.  Do not bring valuables to the hospital. [facility] is not responsible for any missing/lost belongings or valuables.  Notify your doctor if there is any change in your medical condition (cold, fever, infection).  Wear comfortable clothing (specific to your surgery type) to the hospital.  After surgery, you can help prevent lung complications by doing breathing exercises.  Take deep breaths and cough every 1-2 hours. Your doctor may order a device called an Incentive Spirometer to help you take deep breaths. When coughing or sneezing, hold a pillow firmly against your incision with both hands. This is called "splinting." Doing this helps protect your incision. It also decreases belly discomfort.  If you are being admitted to the hospital overnight, leave your suitcase in  the car. After surgery it may be brought to your room.  In case of increased patient census, it may be necessary for you, the patient, to continue your postoperative care in the Same Day Surgery department.  If you are being discharged the day of surgery, you will not be allowed to drive home. You will need a responsible individual to  drive you home and stay with you for 24 hours after surgery.   If you are taking public transportation, you will need to have a responsible individual with you.  Please call the Pre-admissions Testing Dept. at [phone] if you have any questions about these instructions.  Surgery Visitation Policy:  Patients having surgery or a procedure may have two visitors.  Children under the age of 51 must have an adult with them who is not the patient.

## 2023-08-17 NOTE — Progress Notes (Signed)
PERIOPERATIVE PRESCRIPTION FOR IMPLANTED CARDIAC DEVICE PROGRAMMING  Patient Information: Name:  GENESES JAIN  DOB:  05-02-1951  MRN:  [id number]    Planned Procedure: SUSPENSION MICROLARYNGOSCOPY WITH INJECTION (BILATERAL); LARYNGOPLASTY VOCAL FOLD (RIGHT)   Surgeon:  Dr. Bud Face, MD  Requesting device clearance: Quentin Mulling, FNP-C  Date of Procedure:  08/22/2023  Cautery will be used.   Please route documentation back me via [facility], or may fax report to [facility] PAT APP at [phone].  Device Information:  Clinic EP Physician:  Lewayne Bunting, MD   Device Type:  Pacemaker Manufacturer and Phone #:  Medtronic: [phone] Pacemaker Dependent?:  Yes.   Date of Last Device Check:  06/29/2023 Normal Device Function?:  Yes.    Electrophysiologist's Recommendations:  Have magnet available. Provide continuous ECG monitoring when magnet is used or reprogramming is to be performed.  Procedure will likely interfere with device function.  Device should be programmed:  Asynchronous pacing during procedure and returned to normal programming after procedure  Per Device Clinic Standing Orders, Lenor Coffin, RN  4:40 PM 08/17/2023

## 2023-08-18 ENCOUNTER — Encounter: Payer: Self-pay | Admitting: Otolaryngology

## 2023-08-18 ENCOUNTER — Other Ambulatory Visit: Payer: Self-pay | Admitting: Cardiology

## 2023-08-18 ENCOUNTER — Encounter: Payer: Self-pay | Admitting: Family Medicine

## 2023-08-18 ENCOUNTER — Encounter
Admission: RE | Admit: 2023-08-18 | Discharge: 2023-08-18 | Disposition: A | Discharge status: Home / Self Care | Payer: Medicare PPO | Source: Ambulatory Visit | Attending: Otolaryngology | Admitting: Otolaryngology

## 2023-08-18 ENCOUNTER — Other Ambulatory Visit: Payer: Self-pay | Admitting: Family Medicine

## 2023-08-18 DIAGNOSIS — J449 Chronic obstructive pulmonary disease, unspecified: Secondary | ICD-10-CM | POA: Insufficient documentation

## 2023-08-18 DIAGNOSIS — I272 Pulmonary hypertension, unspecified: Secondary | ICD-10-CM | POA: Diagnosis not present

## 2023-08-18 DIAGNOSIS — I10 Essential (primary) hypertension: Secondary | ICD-10-CM | POA: Insufficient documentation

## 2023-08-18 DIAGNOSIS — Z01812 Encounter for preprocedural laboratory examination: Secondary | ICD-10-CM | POA: Diagnosis not present

## 2023-08-18 DIAGNOSIS — M47816 Spondylosis without myelopathy or radiculopathy, lumbar region: Secondary | ICD-10-CM | POA: Diagnosis not present

## 2023-08-18 LAB — CBC
HCT: 36.8 % (ref 36.0–46.0)
Hemoglobin: 12.5 g/dL (ref 12.0–15.0)
MCH: 30.1 pg (ref 26.0–34.0)
MCHC: 34 g/dL (ref 30.0–36.0)
MCV: 88.7 fL (ref 80.0–100.0)
Platelets: 402 10*3/uL — ABNORMAL HIGH (ref 150–400)
RBC: 4.15 MIL/uL (ref 3.87–5.11)
RDW: 14.6 % (ref 11.5–15.5)
WBC: 9.7 10*3/uL (ref 4.0–10.5)
nRBC: 0 % (ref 0.0–0.2)

## 2023-08-18 LAB — BASIC METABOLIC PANEL
Anion gap: 13 (ref 5–15)
BUN: 37 mg/dL — ABNORMAL HIGH (ref 8–23)
CO2: 30 mmol/L (ref 22–32)
Calcium: 9.7 mg/dL (ref 8.9–10.3)
Chloride: 97 mmol/L — ABNORMAL LOW (ref 98–111)
Creatinine, Ser: 1.23 mg/dL — ABNORMAL HIGH (ref 0.44–1.00)
GFR, Estimated: 47 mL/min — ABNORMAL LOW (ref >60)
Glucose, Bld: 115 mg/dL — ABNORMAL HIGH (ref 70–99)
Potassium: 4.3 mmol/L (ref 3.5–5.1)
Sodium: 140 mmol/L (ref 135–145)

## 2023-08-18 NOTE — Progress Notes (Signed)
Perioperative / Anesthesia Services  Pre-Admission Testing Clinical Review / Pre-Operative Anesthesia Consult  Date: 08/21/23  Patient Demographics:  Name: Patricia [name] DOB:   31-Oct-1951 MRN:   [id number]  Planned Surgical Procedure(s):    Case: [id number] Date/Time: 08/22/23 0927   Procedures:      SUSPENSION MICROLARYNGOSCOPY WITH INJECTION (Bilateral)     LARYNGOPLASTY VOCAL FOLD (Right)   Anesthesia type: General   Pre-op diagnosis:      Dysphonia, hoarseness     Paralysis vocal folds unilateral / complete   Location: [facility] OR ROOM 09 / [facility]   Surgeons: Bud Face, MD     NOTE: Available PAT nursing documentation and vital signs have been reviewed. Clinical nursing staff has updated patient's PMH/PSHx, current medication list, and drug allergies/intolerances to ensure comprehensive history available to assist in medical decision making as it pertains to the aforementioned surgical procedure and anticipated anesthetic course. Extensive review of available clinical information personally performed. [place] [facility] and P[facility] updated with any diagnoses/procedures that  may have been inadvertently omitted during her intake with the pre-admission testing department's nursing staff.  Clinical Discussion:  Patricia [name] is a 72 y.o. female who is submitted for pre-surgical anesthesia review and clearance prior to her undergoing the above procedure. Patient has never been a smoker. Pertinent PMH includes: CAD, CHB (s/p PPM placement), atrial fibrillation/flutter, aortic insufficiency (s/p AVR), mitral valve stenosis (s/p MVR), NSVT, tachycardia, aortic atherosclerosis, hypothyroidism, T2DM, pulmonary hypertension, COPD, GERD (on daily PPI), gastric ulcer, anemia, dysphonia/hoarse voice quality, multiple skin cancers, remote LEFT breast cancer (s/p mastectomy + chemotherapy + XRT), pancreatitis, Hodgkin's lymphoma (s/p splenectomy), lower extremity edema,  osteoporosis.  Patient is followed by cardiology Antoine Poche, MD). She was last seen in the cardiology clinic on 04/30/2023; notes reviewed. At the time of her clinic visit, patient doing well overall from a cardiovascular perspective. Patient denied any chest pain, shortness of breath, PND, orthopnea, palpitations, significant peripheral edema, weakness, fatigue, vertiginous symptoms, or presyncope/syncope. Patient with a past medical history significant for cardiovascular diagnoses. Documented physical exam was grossly benign, providing no evidence of acute exacerbation and/or decompensation of the patient's known cardiovascular conditions.    Patient with a history of significant mitral valve stenosis.  She ultimately underwent minimally invasive mitral valve replacement on 09/13/2017, at which time a 25 mm Medtronic Mosaic porcine stented bioprosthetic tissue valve was placed.  Following her mitral valve replacement, patient developed symptomatic complete heart block.  She underwent placement of a Medtronic Azure XT DR MRI SureScan pacemaker on 09/19/2017.  Device is regularly interrogated by her primary electrophysiology team.  Device was last interrogated on 06/29/2023, at which time device was noted to be functioning properly.  Of note, during interrogations of her pacemaker, patient with noted evidence of short runs of nonsustained ventricular tachycardia (NSVT), which improved with the addition of beta-blocker therapy.  Patient with a history of severe aortic valve insufficiency.  Replacement of her aortic valve was performed on 04/07/2022.  19 mm Inspiris Resilia valve was placed.  Most recent coronary CTA was performed on 04/05/2022 that demonstrated an Agatston coronary artery calcium score of 23. This placed patient in the 51st percentile for age, sex, and race matched controls. Calcium depositions noted in the LAD (25-49%) distribution after the first diagonal.  There was no flow-limiting  stenosis noted.  Study demonstrated normal coronary origin with RIGHT sided dominance.  Most recent TTE was performed on 05/30/2023 revealing a low normal left ventricular  systolic function with an EF of 50-55%.  Septal hypokinesis noted.  Right ventricle mildly enlarged with mildly reduced systolic function.  RVSP 37.1 mmHg; markedly improved following valve repairs.  Moderate to severe tricuspid and moderate pulmonic valve regurgitation observed.  Bioprosthetic mitral and aortic valves noted to be well-seated and functioning properly.  All transvalvular gradients were noted be normal providing no evidence suggestive of valvular stenosis.  Aorta normal in size with no evidence of aneurysmal dilatation.  Patient with an atrial fibrillation diagnosis; CHA2DS2-VASc Score = 4 (age, sex, vascular disease history, T2DM). Her rate and rhythm are currently being maintained on oral metoprolol succinate.  Patient's status post successful ablation back in 1998.  She is not currently on oral anticoagulation therapy. Blood pressure well controlled at 124/72 mmHg on currently prescribed beta-blocker (metoprolol tartrate) and diuretic (torsemide) therapies.  Patient is not on any type of lipid-lowering therapies for ASCVD prevention. Patient is not diabetic.  She does not have an OSAH diagnosis.  Patient makes efforts to maintain an active lifestyle.  She works on her farm and Scientist, clinical (histocompatibility and immunogenetics).  Patient is able to complete all of her ADLs/IADLs independently without significant cardiovascular limitation.  Per the DASI, patient is able to complete >4 METS of physical activity without experiencing any significant angina/anginal equivalent symptoms.  No changes were made to her medication regimen.  Patient to follow-up with outpatient cardiology in 6 months or sooner if needed.  Patricia [name] is scheduled for SUSPENSION MICROLARYNGOSCOPY WITH INJECTION (Bilateral); LARYNGOPLASTY VOCAL FOLD (Right) as treatment modality for  her dysphonia/hoarse voice quality on 08/22/2023 with Dr. Bud Face, MD.  Given patient's past medical history significant for cardiovascular diagnoses, presurgical cardiac clearance was sought by the PAT team.  Per cardiology, "according to the Revised Cardiac Risk Index (RCRI), her Perioperative Risk of Major Cardiac Event is 0.9%. Her Functional Capacity in METs is: 6.36 according to the [place] Activity Status Index (DASI).  Therefore, based on ACC/AHA guidelines, patient would be at ACCEPTABLE risk for the planned procedure without further cardiovascular testing".   In review of her medication reconciliation, the patient is not noted to be taking any type of anticoagulation or antiplatelet therapies that would need to be held during her perioperative course.  Patient denies previous perioperative complications with anesthesia in the past. In review of the available records, it is noted that patient underwent a general anesthetic course at [facility] ([facility]) in 03/2022 without documented complications.      08/17/2023    1:00 PM 06/29/2023    3:53 PM 04/20/2023    8:36 AM  Vitals with BMI  Height 5\' 6"  5\' 6"  5\' 6"   Weight 104 lbs 105 lbs 10 oz 104 lbs 13 oz  BMI 16.79 17.05 16.92  Systolic  120 124  Diastolic  74 72  Pulse  99 92    Providers/Specialists:   NOTE: Primary physician provider listed below. Patient may have been seen by APP or partner within same practice.   PROVIDER ROLE / SPECIALTY LAST Juline Patch, MD Otolaryngology (Surgeon) 08/07/2023  Donita Brooks, MD Primary Care Provider 04/11/2023  Rollene Rotunda, MD  Cardiology 04/20/2023; update call with preop APP on 08/17/2023  Lewayne Bunting, MD Electrophysiology 06/21/2023   Allergies:  Aspirin, Azithromycin, Penicillins, Sulfa antibiotics, and Cheese  Current Home Medications:   No current facility-administered medications for this encounter.    acetaminophen (TYLENOL) 325 MG tablet    alendronate (FOSAMAX) 70 MG tablet  Blood Glucose Monitoring Suppl (ACCU-CHEK GUIDE) w/Device KIT   Calcium Carbonate (CALCIUM 600 PO)   CREON 36000-114000 units CPEP capsule   esomeprazole (NEXIUM) 40 MG capsule   fluticasone (FLONASE) 50 MCG/ACT nasal spray   glucose blood (ACCU-CHEK GUIDE) test strip   Lancets (ACCU-CHEK SOFT TOUCH) lancets   Lancets Misc. (ACCU-CHEK SOFTCLIX LANCET DEV) KIT   levothyroxine (SYNTHROID) 75 MCG tablet   metoprolol tartrate (LOPRESSOR) 100 MG tablet   Multiple Vitamin (MULTIVITAMIN WITH MINERALS) TABS tablet   torsemide (DEMADEX) 10 MG tablet   History:   Past Medical History:  Diagnosis Date   Anemia    Aortic atherosclerosis (HCC)    Aortic insufficiency    a.) s/p AVR 04/07/2022 --> 19 mm Inspiris Resilia valve   Atrial fibrillation and flutter (HCC)    a.) s/p ablation 1998; b.) CHA2DS2-VASc: 93 (age, sex, vascular disease history, T2DM); b.) rate/rhythm maintained on oral metoprolol tartrate; no chronic OAC   Basal cell carcinoma 06/05/2019   R low back, EDC 07/16/19   Basal cell carcinoma 04/15/2010   Right post shoulder   Basal cell carcinoma 04/15/2010   Mid back   Basal cell carcinoma 07/13/2023   left top of shoulder, needs EDC   Basal cell carcinoma (BCC) 09/29/2016   Left post shoulder. Superficial and nodular.   BCC (basal cell carcinoma) 07/21/2021   right mid back, [facility] 09/15/2021   BCC (basal cell carcinoma) 01/27/2022   left lower abdomen, EDC 07/28/22   BCC (basal cell carcinoma) 07/28/2022   right lateral forehead, EDC 08/10/22   BCC (basal cell carcinoma) 07/28/2022   left buttock, EDC 08/10/22   Bilateral pleural effusion    Bleeding gastric ulcer 2000   Breast cancer, left (HCC) 2005   a.)  s/p LEFT mastectomy 2005 --> lymph nodes were (+) --> Tx'd with systemic antineoplastic chemotherapy   CAD (coronary artery disease)    a.) cCTA 04/05/2022: Ca2+ 23 (51st %'ile for age/sex/race matched control); 25-49% mLAD  calcification   CHB (complete heart block) (HCC)    a.) symptomatic; s/p Medtronic Azure XT DR MRI SureScan PPM placement 09/19/2017   COPD (chronic obstructive pulmonary disease) (HCC)    Diet-controlled type 2 diabetes mellitus (HCC)    Dysphonia    Dyspnea    GERD (gastroesophageal reflux disease)    History of basal cell carcinoma (BCC) 03/22/2021   right neck and right medial ear, Moh's   History of bilateral cataract extraction    History of blood transfusion    Hodgkin's lymphoma (HCC) 1974   a.) s/p laparotomy for splenectomy and liver Bx in 1974 --> Tx'd with systemic chemotherapy + chest XRT --> recurrence 2 years later requiring additional systemic chemotherapy and XRT   Hypothyroidism    Lower leg edema    Mitral valve stenosis    a.) s/p minimally invasive MVR 09/13/2017 --> 25 mm Medtronic Mosaic porcine stented bioprosthetic tissue valve   NSVT (nonsustained ventricular tachycardia) (HCC)    Osteoporosis    lumbar verterbral fracture, left ankle fracture, dexa 2015   Pancreatitis    Pneumonia    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   a.) Medtronic Azure XT DR MRI SureScan [id number] (SN: [id number])   Pulmonary HTN (HCC)    a.) TTE: 05/14/14: RVSP 65; b.) TTE 08/31/15: RVSP 51, c.) TTE 12/23/16: RVSP 78; d.) RHC 06/27/17: mRA 6, mPA 37, mPCWP 18, AO sat 99, PA sat 67, CO 4.15, CI 2.43, PVR 4.6, AoV peak-peak 12, mitral  MPG 11; e.) TTE 11/10/17: RVSP 63, f.) TTE 10/19/18: RVSP 52; g.) TTE 10/09/20: RVSP 42.9; h.) TTE 12/22/21: RVSP 54.7; i.) TTE 05/17/22: 47.7; j.) TTE 05/26/22: RVSP 40.5; k.) TTE 05/30/23: RVSP 37.1   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/07/2022   a.) s/p AVR 04/07/2022 --> 19 mm Inspiris Resilia valve   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 09/13/2017   a.) 25 mm Medtronic Mosaic porcine stented bioprosthetic tissue valve   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   Left medial lower leg   Squamous cell  carcinoma in situ (SCCIS) 07/28/2022   right anterior thigh, EDC 08/10/22   Syncope    Tachycardia    Past Surgical History:  Procedure Laterality Date   AORTIC VALVE REPLACEMENT N/A 04/07/2022   Procedure: AORTIC VALVE REPLACEMENT (AVR), USING INSPIRIS 19 MM AORTIC VALVE;  Surgeon: Alleen Borne, MD;  Location: [facility] OR;  Service: Open Heart Surgery;  Laterality: N/A;   ATRIAL FLUTTER ABLATION N/A 1998   BUBBLE STUDY  01/12/2022   Procedure: BUBBLE STUDY;  Surgeon: Sande Rives, MD;  Location: [facility] ENDOSCOPY;  Service: Cardiovascular;;   CARDIAC ELECTROPHYSIOLOGY MAPPING AND ABLATION  1999   CATARACT EXTRACTION, BILATERAL     COLONOSCOPY     IR RADIOLOGY PERIPHERAL GUIDED IV START  08/03/2017   IR THORACENTESIS ASP PLEURAL SPACE W/IMG GUIDE  05/23/2022   IR THORACENTESIS ASP PLEURAL SPACE W/IMG GUIDE  05/24/2022   IR US GUIDE VASC ACCESS RIGHT  08/03/2017   LAPAROTOMY N/A 1974   MASTECTOMY Left 2005   MITRAL VALVE REPAIR Right 09/13/2017   Procedure: MINIMALLY INVASIVE MITRAL VALVE REPLACEMENT (MVR) with Medtronic Mosaic Porcine Heart Valve size 25mm;  Surgeon: Purcell Nails, MD;  Location: [facility] OR;  Service: Open Heart Surgery;  Laterality: Right;   ORIF ANKLE FRACTURE Left 03/21/2014   Procedure: LEFT ANKLE FRACTURE OPEN TREATMENT BILMALLEOLAR INCLUDES INTERNAL FIXATION ;  Surgeon: Sheral Apley, MD;  Location: [facility];  Service: Orthopedics;  Laterality: Left;   PACEMAKER IMPLANT N/A 09/19/2017   Medtronic Azure XT MRI conditional dual-chamber pacemaker for symptomatic complete heart block  by Dr Johney Frame   RIGHT/LEFT HEART CATH AND CORONARY ANGIOGRAPHY N/A 06/27/2017   Procedure: Right/Left Heart Cath and Coronary Angiography;  Surgeon: Laurey Morale, MD;  Location: [facility] INVASIVE CV LAB;  Service: Cardiovascular;  Laterality: N/A;   SPLENECTOMY  1974   hodgkins disease   TEE WITHOUT CARDIOVERSION  06/12/2012   Procedure: TRANSESOPHAGEAL ECHOCARDIOGRAM  (TEE);  Surgeon: Laurey Morale, MD;  Location: [facility] ENDOSCOPY;  Service: Cardiovascular;  Laterality: N/A;   TEE WITHOUT CARDIOVERSION N/A 06/29/2017   Procedure: TRANSESOPHAGEAL ECHOCARDIOGRAM (TEE);  Surgeon: Jake Bathe, MD;  Location: [facility] ENDOSCOPY;  Service: Cardiovascular;  Laterality: N/A;   TEE WITHOUT CARDIOVERSION N/A 09/13/2017   Procedure: TRANSESOPHAGEAL ECHOCARDIOGRAM (TEE);  Surgeon: Purcell Nails, MD;  Location: [facility] OR;  Service: Open Heart Surgery;  Laterality: N/A;   TEE WITHOUT CARDIOVERSION N/A 01/12/2022   Procedure: TRANSESOPHAGEAL ECHOCARDIOGRAM (TEE);  Surgeon: Sande Rives, MD;  Location: [facility] ENDOSCOPY;  Service: Cardiovascular;  Laterality: N/A;   TEE WITHOUT CARDIOVERSION N/A 04/07/2022   Procedure: TRANSESOPHAGEAL ECHOCARDIOGRAM (TEE);  Surgeon: Alleen Borne, MD;  Location: [facility] OR;  Service: Open Heart Surgery;  Laterality: N/A;   TONSILLECTOMY AND ADENOIDECTOMY     Family History  Problem Relation Age of Onset   Rheumatic fever Father        Died suddenly  age 31   Diabetes Father    Cancer Father        colon   Heart disease Father        rheumatic fever x 3   Arthritis Maternal Grandmother    Hypertension Maternal Grandmother    Diabetes Paternal Grandmother    Vision loss Paternal Grandmother        glaucoma   Social History   Tobacco Use   Smoking status: Never   Smokeless tobacco: Never  Vaping Use   Vaping status: Never Used  Substance Use Topics   Alcohol use: No   Drug use: No    Pertinent Clinical Results:  LABS:   No visits with results within 3 Day(s) from this visit.  Latest known visit with results is:  Hospital Outpatient Visit on 08/18/2023  Component Date Value Ref Range Status   WBC 08/18/2023 9.7  4.0 - 10.5 K/uL Final   RBC 08/18/2023 4.15  3.87 - 5.11 MIL/uL Final   Hemoglobin 08/18/2023 12.5  12.0 - 15.0 g/dL Final   HCT 56/21/3086 36.8  36.0 - 46.0 % Final   MCV 08/18/2023 88.7  80.0 - 100.0 fL Final    MCH 08/18/2023 30.1  26.0 - 34.0 pg Final   MCHC 08/18/2023 34.0  30.0 - 36.0 g/dL Final   RDW 57/84/6962 14.6  11.5 - 15.5 % Final   Platelets 08/18/2023 402 (H)  150 - 400 K/uL Final   nRBC 08/18/2023 0.0  0.0 - 0.2 % Final   Performed at [facility], [address] [place], [place] [address]   Sodium 08/18/2023 140  135 - 145 mmol/L Final   Potassium 08/18/2023 4.3  3.5 - 5.1 mmol/L Final   Chloride 08/18/2023 97 (L)  98 - 111 mmol/L Final   CO2 08/18/2023 30  22 - 32 mmol/L Final   Glucose, Bld 08/18/2023 115 (H)  70 - 99 mg/dL Final   Glucose reference range applies only to samples taken after fasting for at least 8 hours.   BUN 08/18/2023 37 (H)  8 - 23 mg/dL Final   Creatinine, Ser 08/18/2023 1.23 (H)  0.44 - 1.00 mg/dL Final   Calcium 13/24/4010 9.7  8.9 - 10.3 mg/dL Final   GFR, Estimated 08/18/2023 47 (L)  >60 mL/min Final   Comment: (NOTE) Calculated using the CKD-EPI Creatinine Equation (2021)    Anion gap 08/18/2023 13  5 - 15 Final   Performed at [facility], [address] [place], [place] [address]    ECG: Date: 06/29/2023 Time ECG obtained: 1551 PM Rate: 99 bpm Rhythm:  Atrial sensed ventricular paced rhythm Axis (leads I and aVF): Normal Intervals: PR 206 ms. QRS 146 ms. QTc 505 ms. ST segment and T wave changes: No evidence of acute ST segment elevation or depression.   Comparison: Similar to previous tracing obtained on 04/20/2023   IMAGING / PROCEDURES: TRANSTHORACIC ECHOCARDIOGRAM performed on 05/30/2023 Left ventricular ejection fraction, by estimation, is 50 to 55%. The left ventricle has low normal function. The left ventricle demonstrates regional wall motion abnormalities. Septal hypokinesis. Left ventricular diastolic parameters are indeterminate.  Right ventricular systolic function is mildly reduced. The right ventricular size is mildly enlarged. There is mildly elevated pulmonary artery systolic pressure. The estimated right  ventricular systolic pressure is 37.1 mmHg.  The mitral valve has been repaired/replaced. No evidence of mitral valve regurgitation. The mean mitral valve gradient is 7.0 mmHg with average heart rate of 93 bpm, stable  from prior echo. There is a 25 mm Medtronic bioprosthetic valve present in the mitral position.  The tricuspid valve is abnormal. Tricuspid valve regurgitation is moderate to severe.  The aortic valve has been repaired/replaced. Aortic valve regurgitation is not visualized. There is a 19 mm Inspiris Resilia valve present in the aortic position. Echo findings are consistent with normal structure and function of the aortic valve prosthesis.  Pulmonic valve regurgitation is moderate.  The inferior vena cava is normal in size with greater than 50% respiratory variability, suggesting right atrial pressure of 3 mmHg.   CT SOFT TISSUE NECK W CONTRAST performed on 04/25/2023 Symmetric appearance of the glottis, no detected cord paresis.  Enlargement of right thoracic inlet lymph nodes measuring up to 14 mm in length, long-standing when compared with remote imaging including cervical MRI in 2016 and chest CT from 13 months prior.  CT CORONARY MORPH W/CTA COR W/SCORE W/CA W/CM &/OR WO/CM performed on 04/05/2022 Coronary calcium score of 23 (LAD). This was 48 percentile for age and sex matched control. Normal coronary origin with right dominance. Mid calcified LAD plaque after first diagonal 25-49%. Non flow limiting. No circumflex disease identified. Pacemaker leads, mitral valve repair.   RIGHT/LEFT HEART CATHETERIZATION AND CORONARY ANGIOGRAPHY performed on 06/27/2017 No significant coronary disease.  Normal LV systolic function.  3+ mitral regurgitation.  Severe mitral stenosis.  No more than mild aortic stenosis.  Mildly elevated PCWP with near-normal LVEDP.  Moderate pulmonary hypertension, appears to be mixed pulmonary venous hypertension and pulmonary arterial hypertension (may be  component of reactive pulmonary arterial changes in the setting long-standing mitral valve disease).  Root shot not done to assess aortic insufficiency, will have TEE on Thursday.  Hemodynamics (mmHg): RA mean 6 RV 68/8 PA 62/16, mean 37 PCWP mean 18 LV 106/14 AO 94/44 Oxygen saturations PA 67% AO 99% Cardiac Output (Fick) 4.15  Cardiac Index (Fick) 2.43 PVR 4.6 WU Aortic valve peak to peak gradient 12 mmHg Mitral valve mean gradient 11 mmHg Mitral valve area (continuity equation) 1.07 cm^2    Impression and Plan:  Patricia [name] has been referred for pre-anesthesia review and clearance prior to her undergoing the planned anesthetic and procedural courses. Available labs, pertinent testing, and imaging results were personally reviewed by me in preparation for upcoming operative/procedural course. [facility] medical record has been updated following extensive record review and patient interview with PAT staff.   This patient has been appropriately cleared by cardiology with an overall ACCEPTABLE risk of experiencing significant perioperative cardiovascular complications. Completed perioperative prescription for cardiac device management documentation completed by primary cardiology team and placed on patient's chart for review by the surgical/anesthetic team on the day of her procedure. Electrophysiology indicating that procedure will likely interfere with planned surgical procedure.  Recommendations are for device to be programmed to asynchronous pacing during the procedure and return to normal programming postoperatively.  This be accomplished by placing magnet over device intraoperatively.  Device will revert to previous programming following magnet removal.  Beyond normal perioperative cardiovascular monitoring, there are no recommendations from electrophysiology team that prompt further discussion/recommendations from industry representative.   Based on clinical review performed today  (08/21/23), barring any significant acute changes in the patient's overall condition, it is is anticipated that she will be able to proceed with the planned surgical intervention. Any acute changes in clinical condition may necessitate her procedure being postponed and/or cancelled. Patient will meet with anesthesia team (MD and/or CRNA) on the day of her  procedure for preoperative evaluation/assessment. Questions regarding anesthetic course will be fielded at that time.   Pre-surgical instructions were reviewed with the patient during her PAT appointment, and questions were fielded to satisfaction by PAT clinical staff. She has been instructed on which medications that she will need to hold prior to surgery, as well as the ones that have been deemed safe/appropriate to take on the day of her procedure. As part of the general education provided by PAT, patient made aware both verbally and in writing, that she would need to abstain from the use of any illegal substances during her perioperative course.  She was advised that failure to follow the provided instructions could necessitate case cancellation or result in serious perioperative complications up to and including death. Patient encouraged to contact PAT and/or her surgeon's office to discuss any questions or concerns that may arise prior to surgery; verbalized understanding.   Quentin Mulling, MSN, APRN, FNP-C, CEN [facility]  Peri-operative Services Nurse Practitioner Phone: [phone] Fax: [phone] 08/21/23 9:23 AM  NOTE: This note has been prepared using Dragon dictation software. Despite my best ability to proofread, there is always the potential that unintentional transcriptional errors may still occur from this process.

## 2023-08-21 DIAGNOSIS — M47816 Spondylosis without myelopathy or radiculopathy, lumbar region: Secondary | ICD-10-CM | POA: Diagnosis not present

## 2023-08-21 MED ORDER — LACTATED RINGERS IV SOLN: INTRAVENOUS | Status: DC

## 2023-08-21 MED ORDER — CHLORHEXIDINE GLUCONATE 0.12 % MT SOLN
15.0000 mL | Freq: Once | OROMUCOSAL | Status: AC
  Administered 2023-08-22: 15 mL via OROMUCOSAL

## 2023-08-21 MED ORDER — ORAL CARE MOUTH RINSE: 15.0000 mL | Freq: Once | OROMUCOSAL | Status: AC

## 2023-08-21 NOTE — Telephone Encounter (Signed)
Request is too soon, duplicate request.  Requested Prescriptions  Pending Prescriptions Disp Refills   fluticasone (FLONASE) 50 MCG/ACT nasal spray [Pharmacy Med Name: FLUTICASONE PROPIONATE SUSPENSION] 16 mL 0    Sig: PLACE TWO SPRAYS INTO EACH NOSTRIL DAILY     Ear, Nose, and Throat: Nasal Preparations - Corticosteroids Failed - 08/18/2023  3:00 PM      Failed - Valid encounter within last 12 months    Recent Outpatient Visits           1 year ago Dyspnea, unspecified type   [facility] Donita Brooks, MD   1 year ago Bilateral lower extremity edema   [facility] Valentino Nose, NP   1 year ago Subacute cough   [facility] Donita Brooks, MD   2 years ago Acute right-sided low back pain without sciatica   [facility] Donita Brooks, MD   3 years ago Postmenopausal estrogen deficiency   [facility] Pickard, Priscille Heidelberg, MD       Future Appointments             In 2 months Rollene Rotunda, MD [facility]   In 5 months Deirdre Evener, MD [facility]

## 2023-08-22 ENCOUNTER — Encounter: Payer: Self-pay | Admitting: Otolaryngology

## 2023-08-22 ENCOUNTER — Encounter
Admission: RE | Disposition: A | Discharge status: Home / Self Care | Payer: Self-pay | Source: Home / Self Care | Attending: Otolaryngology

## 2023-08-22 ENCOUNTER — Other Ambulatory Visit: Payer: Self-pay

## 2023-08-22 ENCOUNTER — Ambulatory Visit: Payer: Medicare PPO | Admitting: Urgent Care

## 2023-08-22 ENCOUNTER — Ambulatory Visit
Admission: RE | Admit: 2023-08-22 | Discharge: 2023-08-22 | Disposition: A | Discharge status: Home / Self Care | Payer: Medicare PPO | Attending: Otolaryngology | Admitting: Otolaryngology

## 2023-08-22 DIAGNOSIS — I10 Essential (primary) hypertension: Secondary | ICD-10-CM | POA: Insufficient documentation

## 2023-08-22 DIAGNOSIS — E039 Hypothyroidism, unspecified: Secondary | ICD-10-CM | POA: Insufficient documentation

## 2023-08-22 DIAGNOSIS — J3801 Paralysis of vocal cords and larynx, unilateral: Secondary | ICD-10-CM | POA: Diagnosis not present

## 2023-08-22 DIAGNOSIS — R490 Dysphonia: Secondary | ICD-10-CM | POA: Diagnosis not present

## 2023-08-22 DIAGNOSIS — R1313 Dysphagia, pharyngeal phase: Secondary | ICD-10-CM | POA: Insufficient documentation

## 2023-08-22 DIAGNOSIS — J449 Chronic obstructive pulmonary disease, unspecified: Secondary | ICD-10-CM | POA: Insufficient documentation

## 2023-08-22 DIAGNOSIS — E119 Type 2 diabetes mellitus without complications: Secondary | ICD-10-CM | POA: Diagnosis not present

## 2023-08-22 DIAGNOSIS — K219 Gastro-esophageal reflux disease without esophagitis: Secondary | ICD-10-CM | POA: Diagnosis not present

## 2023-08-22 DIAGNOSIS — I2510 Atherosclerotic heart disease of native coronary artery without angina pectoris: Secondary | ICD-10-CM | POA: Insufficient documentation

## 2023-08-22 DIAGNOSIS — Z8711 Personal history of peptic ulcer disease: Secondary | ICD-10-CM | POA: Diagnosis not present

## 2023-08-22 DIAGNOSIS — I480 Paroxysmal atrial fibrillation: Secondary | ICD-10-CM | POA: Diagnosis not present

## 2023-08-22 HISTORY — DX: Dysphonia: R49.0

## 2023-08-22 HISTORY — DX: Atherosclerotic heart disease of native coronary artery without angina pectoris: I25.10

## 2023-08-22 HISTORY — PX: MICROLARYNGOSCOPY: SHX5208

## 2023-08-22 HISTORY — DX: Cataract extraction status, right eye: Z98.41

## 2023-08-22 HISTORY — DX: Nonrheumatic aortic (valve) insufficiency: I35.1

## 2023-08-22 HISTORY — DX: Type 2 diabetes mellitus without complications: E11.9

## 2023-08-22 HISTORY — DX: Unspecified atrial fibrillation: I48.91

## 2023-08-22 LAB — GLUCOSE, CAPILLARY: Glucose-Capillary: 102 mg/dL — ABNORMAL HIGH (ref 70–99)

## 2023-08-22 SURGERY — MICROLARYNGOSCOPY
Anesthesia: General | Laterality: Bilateral

## 2023-08-22 MED ORDER — LIDOCAINE HCL (PF) 2 % IJ SOLN
INTRAMUSCULAR | Status: AC
  Filled 2023-08-22: qty 5

## 2023-08-22 MED ORDER — OXYCODONE HCL 5 MG/5ML PO SOLN: 5.0000 mg | Freq: Once | ORAL | Status: DC | PRN

## 2023-08-22 MED ORDER — PROMETHAZINE HCL 25 MG/ML IJ SOLN: 6.2500 mg | INTRAMUSCULAR | Status: DC | PRN

## 2023-08-22 MED ORDER — FENTANYL CITRATE (PF) 100 MCG/2ML IJ SOLN
INTRAMUSCULAR | Status: AC
  Filled 2023-08-22: qty 2

## 2023-08-22 MED ORDER — PHENYLEPHRINE HCL (PRESSORS) 10 MG/ML IV SOLN
INTRAVENOUS | Status: DC | PRN
  Administered 2023-08-22: 80 ug via INTRAVENOUS

## 2023-08-22 MED ORDER — PHENYLEPHRINE HCL-NACL 20-0.9 MG/250ML-% IV SOLN
INTRAVENOUS | Status: AC
  Filled 2023-08-22: qty 250

## 2023-08-22 MED ORDER — PROPOFOL 10 MG/ML IV BOLUS
INTRAVENOUS | Status: AC
  Filled 2023-08-22: qty 20

## 2023-08-22 MED ORDER — LIDOCAINE HCL (CARDIAC) PF 100 MG/5ML IV SOSY
PREFILLED_SYRINGE | INTRAVENOUS | Status: DC | PRN
  Administered 2023-08-22: 100 mg via INTRAVENOUS

## 2023-08-22 MED ORDER — ACETAMINOPHEN 10 MG/ML IV SOLN: 1000.0000 mg | Freq: Once | INTRAVENOUS | Status: DC | PRN

## 2023-08-22 MED ORDER — CHLORHEXIDINE GLUCONATE 0.12 % MT SOLN
OROMUCOSAL | Status: AC
  Filled 2023-08-22: qty 15

## 2023-08-22 MED ORDER — DEXAMETHASONE SODIUM PHOSPHATE 10 MG/ML IJ SOLN
INTRAMUSCULAR | Status: DC | PRN
  Administered 2023-08-22: 10 mg via INTRAVENOUS

## 2023-08-22 MED ORDER — FENTANYL CITRATE (PF) 100 MCG/2ML IJ SOLN
INTRAMUSCULAR | Status: DC | PRN
  Administered 2023-08-22 (×2): 50 ug via INTRAVENOUS

## 2023-08-22 MED ORDER — MIDAZOLAM HCL 2 MG/2ML IJ SOLN
INTRAMUSCULAR | Status: AC
  Filled 2023-08-22: qty 2

## 2023-08-22 MED ORDER — DEXAMETHASONE SODIUM PHOSPHATE 10 MG/ML IJ SOLN
INTRAMUSCULAR | Status: AC
  Filled 2023-08-22: qty 1

## 2023-08-22 MED ORDER — ROCURONIUM BROMIDE 10 MG/ML (PF) SYRINGE
PREFILLED_SYRINGE | INTRAVENOUS | Status: AC
  Filled 2023-08-22: qty 10

## 2023-08-22 MED ORDER — PROPOFOL 10 MG/ML IV BOLUS
INTRAVENOUS | Status: DC | PRN
  Administered 2023-08-22: 30 mg via INTRAVENOUS
  Administered 2023-08-22: 150 mg via INTRAVENOUS
  Administered 2023-08-22: 50 mg via INTRAVENOUS

## 2023-08-22 MED ORDER — SUCCINYLCHOLINE CHLORIDE 200 MG/10ML IV SOSY
PREFILLED_SYRINGE | INTRAVENOUS | Status: DC | PRN
  Administered 2023-08-22: 50 mg via INTRAVENOUS

## 2023-08-22 MED ORDER — PHENYLEPHRINE 80 MCG/ML (10ML) SYRINGE FOR IV PUSH (FOR BLOOD PRESSURE SUPPORT)
PREFILLED_SYRINGE | INTRAVENOUS | Status: AC
  Filled 2023-08-22: qty 10

## 2023-08-22 MED ORDER — ONDANSETRON HCL 4 MG PO TABS: 4.0000 mg | ORAL_TABLET | Freq: Three times a day (TID) | ORAL | 0 refills | Status: DC | PRN

## 2023-08-22 MED ORDER — ONDANSETRON HCL 4 MG/2ML IJ SOLN
INTRAMUSCULAR | Status: AC
  Filled 2023-08-22: qty 2

## 2023-08-22 MED ORDER — MIDAZOLAM HCL 2 MG/2ML IJ SOLN
INTRAMUSCULAR | Status: DC | PRN
  Administered 2023-08-22 (×2): 1 mg via INTRAVENOUS

## 2023-08-22 MED ORDER — FENTANYL CITRATE (PF) 100 MCG/2ML IJ SOLN: 25.0000 ug | INTRAMUSCULAR | Status: DC | PRN

## 2023-08-22 MED ORDER — PROPOFOL 500 MG/50ML IV EMUL
INTRAVENOUS | Status: DC | PRN
  Administered 2023-08-22: 125 ug/kg/min via INTRAVENOUS

## 2023-08-22 MED ORDER — SUCCINYLCHOLINE CHLORIDE 200 MG/10ML IV SOSY
PREFILLED_SYRINGE | INTRAVENOUS | Status: AC
  Filled 2023-08-22: qty 10

## 2023-08-22 MED ORDER — DROPERIDOL 2.5 MG/ML IJ SOLN: 0.6250 mg | Freq: Once | INTRAMUSCULAR | Status: DC | PRN

## 2023-08-22 MED ORDER — ONDANSETRON HCL 4 MG/2ML IJ SOLN
INTRAMUSCULAR | Status: DC | PRN
  Administered 2023-08-22: 4 mg via INTRAVENOUS

## 2023-08-22 MED ORDER — OXYCODONE HCL 5 MG PO TABS: 5.0000 mg | ORAL_TABLET | Freq: Once | ORAL | Status: DC | PRN

## 2023-08-22 SURGICAL SUPPLY — 19 items
ANTIFOG SOL W/FOAM PAD STRL (MISCELLANEOUS) ×2
BASIN GRAD PLASTIC 32OZ STRL (MISCELLANEOUS) ×1 IMPLANT
COVER MAYO STAND STRL (DRAPES) ×2 IMPLANT
CUP MEDICINE 2OZ PLAST GRAD ST (MISCELLANEOUS) ×1 IMPLANT
DRAPE TABLE BACK 80X90 (DRAPES) ×2 IMPLANT
DRSG TELFA 3X4 N-ADH STERILE (GAUZE/BANDAGES/DRESSINGS) ×1 IMPLANT
GAUZE 4X4 16PLY ~~LOC~~+RFID DBL (SPONGE) ×1 IMPLANT
GLOVE BIO SURGEON STRL SZ7.5 (GLOVE) ×3 IMPLANT
GOWN STRL REUS W/ TWL LRG LVL3 (GOWN DISPOSABLE) ×6 IMPLANT
GOWN STRL REUS W/TWL LRG LVL3 (GOWN DISPOSABLE) ×4
KIT PROLARN GEL W/NDL (Miscellaneous) ×1 IMPLANT
MANIFOLD NEPTUNE II (INSTRUMENTS) ×2 IMPLANT
NDL SAFETY ECLIP 18X1.5 (MISCELLANEOUS) ×1 IMPLANT
PATTIES SURGICAL .5 X.5 (GAUZE/BANDAGES/DRESSINGS) ×2 IMPLANT
SOLUTION ANTFG W/FOAM PAD STRL (MISCELLANEOUS) ×2 IMPLANT
TOWEL OR 17X26 4PK STRL BLUE (TOWEL DISPOSABLE) ×2 IMPLANT
TRAP FLUID SMOKE EVACUATOR (MISCELLANEOUS) ×2 IMPLANT
TUBING CONNECTING 10 (TUBING) ×2 IMPLANT
WATER STERILE IRR 500ML POUR (IV SOLUTION) ×1 IMPLANT

## 2023-08-22 NOTE — Anesthesia Procedure Notes (Signed)
Procedure Name: Intubation Date/Time: 08/22/2023 11:42 AM  Performed by: Lysbeth Penner, CRNAPre-anesthesia Checklist: Patient identified, Emergency Drugs available, Suction available and Patient being monitored Patient Re-evaluated:Patient Re-evaluated prior to induction Oxygen Delivery Method: Circle system utilized Preoxygenation: Pre-oxygenation with 100% oxygen Induction Type: IV induction Ventilation: Mask ventilation without difficulty Laryngoscope Size: McGraph and 3 Grade View: Grade I Tube type: MLT Tube size: 6.0 mm Number of attempts: 1 Airway Equipment and Method: Stylet and Oral airway Placement Confirmation: ETT inserted through vocal cords under direct vision, positive ETCO2 and breath sounds checked- equal and bilateral Secured at: 20 cm Tube secured with: Tape Dental Injury: Teeth and Oropharynx as per pre-operative assessment

## 2023-08-22 NOTE — Transfer of Care (Signed)
Immediate Anesthesia Transfer of Care Note  Patient: Patricia [name]  Procedure(s) Performed: SUSPENSION MICROLARYNGOSCOPY WITH INJECTION (Bilateral)  Patient Location: PACU  Anesthesia Type:General  Level of Consciousness: drowsy and patient cooperative  Airway & Oxygen Therapy: Patient Spontanous Breathing and Patient connected to face mask oxygen  Post-op Assessment: Report given to RN and Post -op Vital signs reviewed and stable  Post vital signs: Reviewed and stable  Last Vitals:  Vitals Value Taken Time  BP 122/50 08/22/23 1215  Temp 36.3 C 08/22/23 1214  Pulse 84 08/22/23 1220  Resp 10 08/22/23 1220  SpO2 100 % 08/22/23 1220  Vitals shown include unfiled device data.  Last Pain:  Vitals:   08/22/23 0822  TempSrc: Temporal  PainSc: 0-No pain         Complications: No notable events documented.

## 2023-08-22 NOTE — Discharge Instructions (Signed)
AMBULATORY SURGERY  ?DISCHARGE INSTRUCTIONS ? ? ?The drugs that you were given will stay in your system until tomorrow so for the next 24 hours you should not: ? ?Drive an automobile ?Make any legal decisions ?Drink any alcoholic beverage ? ? ?You may resume regular meals tomorrow.  Today it is better to start with liquids and gradually work up to solid foods. ? ?You may eat anything you prefer, but it is better to start with liquids, then soup and crackers, and gradually work up to solid foods. ? ? ?Please notify your doctor immediately if you have any unusual bleeding, trouble breathing, redness and pain at the surgery site, drainage, fever, or pain not relieved by medication. ? ? ? ?Additional Instructions: ? ? ? ?Please contact your physician with any problems or Same Day Surgery at [phone], Monday through Friday 6 am to 4 pm, or [place] [facility] [place] Main number at [phone].  ?

## 2023-08-22 NOTE — Op Note (Signed)
....  08/22/2023  11:58 AM    Patricia [name]  [id number]   Pre-Op Dx:  Dysphonia, right vocal fold paresis  Post-op Dx: same  Proc: Suspension Microlaryngoscopy with Prolaryn VOICE GEL injection of bilateral vocal folds Right > Left 0.3cc in total  Surg:  Brenda Cowher  Anes:  GOT  EBL:  <60ml  Comp:  none  Findings:  0.93ml of voice gel injected.  More bowing noted on left on laryngoscopy today than appreciated in office so therefore bilateral vocal folds injected.  Procedure: After the patient was identified in holding and the history and physical and consent was reviewed, the patient was taken to the operating room and placed in a supine position. General endotracheal anesthesia was induced in the normal fashion.  At this time, the patient was rotated 90 degrees and a shoulder roll was placed as well as well as a mouth guard.   At this time,Dedo laryngoscope was inserted into the patient's oral cavity. Visualization of the oral cavity, oropharynx, pharynx and larynx was made. This demonstrated vocal fold bowing bilaterally but moreso on the patient's left.  Patient with previous history of right vocal fold paresis that has persisted following sternotomy incision.  To get a better view, the anterior commisure scope was brought onto the field and inserted into the patient's oral cavity.  This allowed a more complete view of the patient's glottis.  Again bowing was noted bilaterally but more prominent on the patient's left side.  The paretic vocal fold was splayed laterally by the MLT ETT.    The anterior commisure scope was suspended from the mayo stand with a Lewy arm.  At this time, the Prolaryn Voice injector needle was brought onto the field and using a hopkins rod the needle was inserted into the proper depth just lateral to the vocal process and area of bowing on the patient's right vocal fold.  This was injected for bulging of the vocal fold.  This was repeated on the  patient's left side making a symmetrical appearing glottis with resolution of bowing.  Quivering was noted on the patient's bilateral vocal folds at one point during the procedure.  The patient's entire airway was next evaluated for hemostasis and meticulous suctioning was performed. 0.25ccs of 4% lidocaine was sprayed onto the epiglottis. The patient was released from suspension and the mouth gag and laryngoscope was removed from the patient's oral cavity without injury to teeth, lips, or gums.   Dispo:   To PACU in good condition  Plan:  Soft Diet, voice rest for 3 days.  follow up in 1 week for repeat evaluation   Bud Face  08/22/2023 11:58 AM

## 2023-08-22 NOTE — H&P (Signed)
..  History and Physical paper copy reviewed and updated date of procedure and will be scanned into system.  Patient seen and examined and right neck marked.

## 2023-08-23 ENCOUNTER — Encounter: Payer: Self-pay | Admitting: Otolaryngology

## 2023-08-24 NOTE — Anesthesia Postprocedure Evaluation (Signed)
Anesthesia Post Note  Patient: Patricia [name]  Procedure(s) Performed: SUSPENSION MICROLARYNGOSCOPY WITH INJECTION (Bilateral)  Patient location during evaluation: PACU Anesthesia Type: General Level of consciousness: awake and alert Pain management: pain level controlled Vital Signs Assessment: post-procedure vital signs reviewed and stable Respiratory status: spontaneous breathing, nonlabored ventilation, respiratory function stable and patient connected to nasal cannula oxygen Cardiovascular status: blood pressure returned to baseline and stable Postop Assessment: no apparent nausea or vomiting Anesthetic complications: no   No notable events documented.   Last Vitals:  Vitals:   08/22/23 1300 08/22/23 1316  BP: (!) 144/84 132/60  Pulse: 90 95  Resp: 13 16  Temp: 36.6 C 36.9 C  SpO2: 99% 97%    Last Pain:  Vitals:   08/22/23 1316  TempSrc: Temporal  PainSc: 2                  Yevette Edwards

## 2023-08-24 NOTE — Anesthesia Preprocedure Evaluation (Signed)
Anesthesia Evaluation  Patient identified by MRN, date of birth, ID band Patient awake    Reviewed: Allergy & Precautions, H&P , NPO status , Patient's Chart, lab work & pertinent test results, reviewed documented beta blocker date and time   Airway Mallampati: II  TM Distance: >3 FB Neck ROM: full    Dental  (+) Teeth Intact   Pulmonary shortness of breath and with exertion, pneumonia, resolved, COPD   Pulmonary exam normal        Cardiovascular Exercise Tolerance: Good hypertension, + CAD  Normal cardiovascular exam+ pacemaker  Rhythm:regular Rate:Normal     Neuro/Psych negative neurological ROS  negative psych ROS   GI/Hepatic Neg liver ROS, PUD,GERD  ,,  Endo/Other  diabetesHypothyroidism    Renal/GU Renal disease  negative genitourinary   Musculoskeletal   Abdominal   Peds  Hematology  (+) Blood dyscrasia, anemia   Anesthesia Other Findings Past Medical History: No date: Anemia No date: Aortic atherosclerosis (HCC) No date: Aortic insufficiency     Comment:  a.) s/p AVR 04/07/2022 --> 19 mm Inspiris Resilia valve No date: Atrial fibrillation and flutter (HCC)     Comment:  a.) s/p ablation 1998; b.) CHA2DS2-VASc: 77 (age, sex,               vascular disease history, T2DM); b.) rate/rhythm               maintained on oral metoprolol tartrate; no chronic OAC 06/05/2019: Basal cell carcinoma     Comment:  R low back, [facility] 07/16/19 04/15/2010: Basal cell carcinoma     Comment:  Right post shoulder 04/15/2010: Basal cell carcinoma     Comment:  Mid back 07/13/2023: Basal cell carcinoma     Comment:  left top of shoulder, needs EDC 09/29/2016: Basal cell carcinoma (BCC)     Comment:  Left post shoulder. Superficial and nodular. 07/21/2021: BCC (basal cell carcinoma)     Comment:  right mid back, [facility] 09/15/2021 01/27/2022: BCC (basal cell carcinoma)     Comment:  left lower abdomen, [facility] 07/28/22 07/28/2022:  BCC (basal cell carcinoma)     Comment:  right lateral forehead, EDC 08/10/22 07/28/2022: BCC (basal cell carcinoma)     Comment:  left buttock, EDC 08/10/22 No date: Bilateral pleural effusion 2000: Bleeding gastric ulcer 2005: Breast cancer, left (HCC)     Comment:  a.)  s/p LEFT mastectomy 2005 --> lymph nodes were (+)               --> Tx'd with systemic antineoplastic chemotherapy No date: CAD (coronary artery disease)     Comment:  a.) cCTA 04/05/2022: Ca2+ 23 (51st %'ile for               age/sex/race matched control); 25-49% mLAD calcification No date: CHB (complete heart block) (HCC)     Comment:  a.) symptomatic; s/p Medtronic Azure XT DR MRI SureScan               PPM placement 09/19/2017 No date: COPD (chronic obstructive pulmonary disease) (HCC) No date: Diet-controlled type 2 diabetes mellitus (HCC) No date: Dysphonia No date: Dyspnea No date: GERD (gastroesophageal reflux disease) 03/22/2021: History of basal cell carcinoma (BCC)     Comment:  right neck and right medial ear, Moh's No date: History of bilateral cataract extraction No date: History of blood transfusion 1974: Hodgkin's lymphoma (HCC)     Comment:  a.) s/p laparotomy for splenectomy and liver Bx in 1974               -->  Tx'd with systemic chemotherapy + chest XRT -->               recurrence 2 years later requiring additional systemic               chemotherapy and XRT No date: Hypothyroidism No date: Lower leg edema No date: Mitral valve stenosis     Comment:  a.) s/p minimally invasive MVR 09/13/2017 --> 25 mm               Medtronic Mosaic porcine stented bioprosthetic tissue               valve No date: NSVT (nonsustained ventricular tachycardia) (HCC) No date: Osteoporosis     Comment:  lumbar verterbral fracture, left ankle fracture, dexa               2015 No date: Pancreatitis No date: Pneumonia 09/19/2017: Presence of permanent cardiac pacemaker     Comment:  a.) Medtronic Azure XT DR MRI  SureScan [id number] (SN:               [id number]) No date: Pulmonary HTN (HCC)     Comment:  a.) TTE: 05/14/14: RVSP 65; b.) TTE 08/31/15: RVSP 51,               c.) TTE 12/23/16: RVSP 78; d.) RHC 06/27/17: mRA 6, mPA               37, mPCWP 18, AO sat 99, PA sat 67, CO 4.15, CI 2.43, PVR              4.6, AoV peak-peak 12, mitral MPG 11; e.) TTE 11/10/17:               RVSP 63, f.) TTE 10/19/18: RVSP 52; g.) TTE 10/09/20:               RVSP 42.9; h.) TTE 12/22/21: RVSP 54.7; i.) TTE 05/17/22:              47.7; j.) TTE 05/26/22: RVSP 40.5; k.) TTE 05/30/23: RVSP              37.1 04/07/2022: S/P aortic valve replacement     Comment:  a.) s/p AVR 04/07/2022 --> 19 mm Inspiris Resilia valve 09/13/2017: S/P minimally invasive mitral valve replacement with  bioprosthetic valve     Comment:  a.) 25 mm Medtronic Mosaic porcine stented bioprosthetic              tissue valve 04/15/2010: Squamous cell carcinoma in situ     Comment:  Left medial lower leg 07/28/2022: Squamous cell carcinoma in situ (SCCIS)     Comment:  right anterior thigh, EDC 08/10/22 No date: Syncope No date: Tachycardia Past Surgical History: 04/07/2022: AORTIC VALVE REPLACEMENT; N/A     Comment:  Procedure: AORTIC VALVE REPLACEMENT (AVR), USING               INSPIRIS 19 MM AORTIC VALVE;  Surgeon: Alleen Borne,               MD;  Location: [facility] OR;  Service: Open Heart Surgery;                Laterality: N/A; 1998: ATRIAL FLUTTER ABLATION; N/A 01/12/2022: BUBBLE STUDY     Comment:  Procedure: BUBBLE STUDY;  Surgeon: Sande Rives, MD;  Location: [facility] ENDOSCOPY;  Service:               Cardiovascular;; 1999: CARDIAC ELECTROPHYSIOLOGY MAPPING AND ABLATION No date: CATARACT EXTRACTION, BILATERAL No date: COLONOSCOPY 08/03/2017: IR RADIOLOGY PERIPHERAL GUIDED IV START 05/23/2022: IR THORACENTESIS ASP PLEURAL SPACE W/IMG GUIDE 05/24/2022: IR THORACENTESIS ASP PLEURAL SPACE W/IMG GUIDE 08/03/2017: IR  US GUIDE VASC ACCESS RIGHT 1974: LAPAROTOMY; N/A 2005: MASTECTOMY; Left 08/22/2023: MICROLARYNGOSCOPY; Bilateral     Comment:  Procedure: SUSPENSION MICROLARYNGOSCOPY WITH INJECTION;               Surgeon: Bud Face, MD;  Location: [facility] ORS;                Service: ENT;  Laterality: Bilateral; 09/13/2017: MITRAL VALVE REPAIR; Right     Comment:  Procedure: MINIMALLY INVASIVE MITRAL VALVE REPLACEMENT               (MVR) with Medtronic Mosaic Porcine Heart Valve size               25mm;  Surgeon: Purcell Nails, MD;  Location: [facility] OR;                Service: Open Heart Surgery;  Laterality: Right; 03/21/2014: ORIF ANKLE FRACTURE; Left     Comment:  Procedure: LEFT ANKLE FRACTURE OPEN TREATMENT               BILMALLEOLAR INCLUDES INTERNAL FIXATION ;  Surgeon:               Sheral Apley, MD;  Location: [facility];  Service: Orthopedics;  Laterality: Left; 09/19/2017: PACEMAKER IMPLANT; N/A     Comment:  Medtronic Azure XT MRI conditional dual-chamber               pacemaker for symptomatic complete heart block  by Dr               Johney Frame 06/27/2017: RIGHT/LEFT HEART CATH AND CORONARY ANGIOGRAPHY; N/A     Comment:  Procedure: Right/Left Heart Cath and Coronary               Angiography;  Surgeon: Laurey Morale, MD;  Location:               [facility] INVASIVE CV LAB;  Service: Cardiovascular;                Laterality: N/A; 1974: SPLENECTOMY     Comment:  hodgkins disease 06/12/2012: TEE WITHOUT CARDIOVERSION     Comment:  Procedure: TRANSESOPHAGEAL ECHOCARDIOGRAM (TEE);                Surgeon: Laurey Morale, MD;  Location: [facility] ENDOSCOPY;                Service: Cardiovascular;  Laterality: N/A; 06/29/2017: TEE WITHOUT CARDIOVERSION; N/A     Comment:  Procedure: TRANSESOPHAGEAL ECHOCARDIOGRAM (TEE);                Surgeon: Jake Bathe, MD;  Location: [facility] ENDOSCOPY;                Service: Cardiovascular;  Laterality: N/A; 09/13/2017: TEE WITHOUT  CARDIOVERSION; N/A     Comment:  Procedure: TRANSESOPHAGEAL ECHOCARDIOGRAM (TEE);                Surgeon: Purcell Nails, MD;  Location: [facility] OR;  Service: Open Heart Surgery;  Laterality: N/A; 01/12/2022: TEE WITHOUT CARDIOVERSION; N/A     Comment:  Procedure: TRANSESOPHAGEAL ECHOCARDIOGRAM (TEE);                Surgeon: Sande Rives, MD;  Location: [facility]               ENDOSCOPY;  Service: Cardiovascular;  Laterality: N/A; 04/07/2022: TEE WITHOUT CARDIOVERSION; N/A     Comment:  Procedure: TRANSESOPHAGEAL ECHOCARDIOGRAM (TEE);                Surgeon: Alleen Borne, MD;  Location: [facility] OR;  Service:              Open Heart Surgery;  Laterality: N/A; No date: TONSILLECTOMY AND ADENOIDECTOMY BMI    Body Mass Index: 16.79 kg/m     Reproductive/Obstetrics negative OB ROS                             Anesthesia Physical Anesthesia Plan  ASA: 3  Anesthesia Plan: General ETT   Post-op Pain Management:    Induction:   PONV Risk Score and Plan:   Airway Management Planned:   Additional Equipment:   Intra-op Plan:   Post-operative Plan:   Informed Consent: I have reviewed the patients History and Physical, chart, labs and discussed the procedure including the risks, benefits and alternatives for the proposed anesthesia with the patient or authorized representative who has indicated his/her understanding and acceptance.     Dental Advisory Given  Plan Discussed with: CRNA  Anesthesia Plan Comments:        Anesthesia Quick Evaluation

## 2023-08-28 DIAGNOSIS — M47816 Spondylosis without myelopathy or radiculopathy, lumbar region: Secondary | ICD-10-CM | POA: Diagnosis not present

## 2023-08-30 ENCOUNTER — Other Ambulatory Visit: Payer: Self-pay | Admitting: Family Medicine

## 2023-08-30 DIAGNOSIS — H6121 Impacted cerumen, right ear: Secondary | ICD-10-CM | POA: Diagnosis not present

## 2023-08-30 DIAGNOSIS — R490 Dysphonia: Secondary | ICD-10-CM | POA: Diagnosis not present

## 2023-08-30 DIAGNOSIS — M47816 Spondylosis without myelopathy or radiculopathy, lumbar region: Secondary | ICD-10-CM | POA: Diagnosis not present

## 2023-08-30 DIAGNOSIS — J3801 Paralysis of vocal cords and larynx, unilateral: Secondary | ICD-10-CM | POA: Diagnosis not present

## 2023-08-31 NOTE — Telephone Encounter (Signed)
Requested Prescriptions  Pending Prescriptions Disp Refills   fluticasone (FLONASE) 50 MCG/ACT nasal spray [Pharmacy Med Name: FLUTICASONE PROPIONATE SUSPENSION] 16 mL 0    Sig: PLACE TWO SPRAYS INTO EACH NOSTRIL DAILY     Ear, Nose, and Throat: Nasal Preparations - Corticosteroids Failed - 08/30/2023  3:44 PM      Failed - Valid encounter within last 12 months    Recent Outpatient Visits           1 year ago Dyspnea, unspecified type   [facility] Donita Brooks, MD   1 year ago Bilateral lower extremity edema   [facility] Valentino Nose, NP   1 year ago Subacute cough   [facility] Donita Brooks, MD   2 years ago Acute right-sided low back pain without sciatica   [facility] Donita Brooks, MD   3 years ago Postmenopausal estrogen deficiency   [facility] Pickard, Priscille Heidelberg, MD       Future Appointments             In 1 month Rollene Rotunda, MD [facility]   In 4 months Deirdre Evener, MD [facility] [facility]

## 2023-09-04 DIAGNOSIS — M47816 Spondylosis without myelopathy or radiculopathy, lumbar region: Secondary | ICD-10-CM | POA: Diagnosis not present

## 2023-09-20 DIAGNOSIS — M47816 Spondylosis without myelopathy or radiculopathy, lumbar region: Secondary | ICD-10-CM | POA: Diagnosis not present

## 2023-09-26 ENCOUNTER — Encounter: Payer: Self-pay | Admitting: Dermatology

## 2023-09-26 ENCOUNTER — Ambulatory Visit: Payer: Medicare PPO | Admitting: Dermatology

## 2023-09-26 ENCOUNTER — Ambulatory Visit (INDEPENDENT_AMBULATORY_CARE_PROVIDER_SITE_OTHER): Payer: Medicare PPO

## 2023-09-26 DIAGNOSIS — C44619 Basal cell carcinoma of skin of left upper limb, including shoulder: Secondary | ICD-10-CM

## 2023-09-26 DIAGNOSIS — I442 Atrioventricular block, complete: Secondary | ICD-10-CM | POA: Diagnosis not present

## 2023-09-26 NOTE — Patient Instructions (Addendum)

## 2023-09-26 NOTE — Progress Notes (Signed)
   Follow-Up Visit   Subjective  Patricia [name] is a 72 y.o. female who presents for the following: Bx proven BCC L top of shoulder, pt presents for [facility] The patient has spots, moles and lesions to be evaluated, some may be new or changing and the patient may have concern these could be cancer.   The following portions of the chart were reviewed this encounter and updated as appropriate: medications, allergies, medical history  Review of Systems:  No other skin or systemic complaints except as noted in HPI or Assessment and Plan.  Objective  Well appearing patient in no apparent distress; mood and affect are within normal limits.   A focused examination was performed of the following areas: Left shoulder  Relevant exam findings are noted in the Assessment and Plan.  L top of shoulder Pink bx site 1.2cm    Assessment & Plan     Basal cell carcinoma (BCC) of skin of left upper extremity including shoulder L top of shoulder  Epidermal / dermal shaving  Lesion diameter (cm):  1.2 Informed consent: discussed and consent obtained   Timeout: patient name, date of birth, surgical site, and procedure verified   Procedure prep:  Patient was prepped and draped in usual sterile fashion Prep type:  Isopropyl alcohol Anesthesia: the lesion was anesthetized in a standard fashion   Anesthetic:  1% lidocaine w/ epinephrine 1-100,000 buffered w/ 8.4% NaHCO3 Instrument used: flexible razor blade   Hemostasis achieved with: pressure, aluminum chloride and electrodesiccation   Outcome: patient tolerated procedure well   Post-procedure details: sterile dressing applied and wound care instructions given   Dressing type: bandage and bacitracin    Destruction of lesion Complexity: extensive   Destruction method: electrodesiccation and curettage   Informed consent: discussed and consent obtained   Timeout:  patient name, date of birth, surgical site, and procedure verified Procedure prep:   Patient was prepped and draped in usual sterile fashion Prep type:  Isopropyl alcohol Anesthesia: the lesion was anesthetized in a standard fashion   Local anesthetic: 1cc lido w/ epi, 1cc bupivicaine, Total of 2cc. Curettage performed in three different directions: Yes   Electrodesiccation performed over the curetted area: Yes   Final wound size (cm):  1.2 Hemostasis achieved with:  pressure, aluminum chloride and electrodesiccation Outcome: patient tolerated procedure well with no complications   Post-procedure details: sterile dressing applied and wound care instructions given   Dressing type: bandage and bacitracin    BCC bx proven    Return for as scheduled for 8m f/u.  I, Ardis Rowan, RMA, am acting as scribe for Armida Sans, MD .   Documentation: I have reviewed the above documentation for accuracy and completeness, and I agree with the above.  Armida Sans, MD

## 2023-09-27 LAB — CUP PACEART REMOTE DEVICE CHECK
Battery Remaining Longevity: 40 mo
Battery Voltage: 2.94 V
Brady Statistic AP VP Percent: 2.64 %
Brady Statistic AP VS Percent: 0 %
Brady Statistic AS VP Percent: 97.36 %
Brady Statistic AS VS Percent: 0.01 %
Brady Statistic RA Percent Paced: 2.63 %
Brady Statistic RV Percent Paced: 99.99 %
Date Time Interrogation Session: 20241015010411
Implantable Lead Connection Status: 753985
Implantable Lead Connection Status: 753985
Implantable Lead Implant Date: 20181009
Implantable Lead Implant Date: 20181009
Implantable Lead Location: 753859
Implantable Lead Location: 753860
Implantable Lead Model: 5076
Implantable Lead Model: 5076
Implantable Pulse Generator Implant Date: 20181009
Lead Channel Impedance Value: 247 Ohm
Lead Channel Impedance Value: 285 Ohm
Lead Channel Impedance Value: 342 Ohm
Lead Channel Impedance Value: 399 Ohm
Lead Channel Pacing Threshold Amplitude: 0.75 V
Lead Channel Pacing Threshold Amplitude: 1 V
Lead Channel Pacing Threshold Pulse Width: 0.4 ms
Lead Channel Pacing Threshold Pulse Width: 0.4 ms
Lead Channel Sensing Intrinsic Amplitude: 2.375 mV
Lead Channel Sensing Intrinsic Amplitude: 2.375 mV
Lead Channel Sensing Intrinsic Amplitude: 4.375 mV
Lead Channel Sensing Intrinsic Amplitude: 4.375 mV
Lead Channel Setting Pacing Amplitude: 2 V
Lead Channel Setting Pacing Amplitude: 2.5 V
Lead Channel Setting Pacing Pulse Width: 0.4 ms
Lead Channel Setting Sensing Sensitivity: 2 mV
Zone Setting Status: 755011
Zone Setting Status: 755011

## 2023-10-03 ENCOUNTER — Encounter: Payer: Self-pay | Admitting: Dermatology

## 2023-10-13 NOTE — Progress Notes (Signed)
Remote pacemaker transmission.   

## 2023-10-18 NOTE — Progress Notes (Signed)
Cardiology Office Note:   Date:  10/20/2023  ID:  Patricia [name], DOB September 20, 1951, MRN [id number] PCP: Donita Brooks, MD  [facility] Providers Cardiologist:  Rollene Rotunda, MD Electrophysiologist:  Lewayne Bunting, MD {  History of Present Illness:   Patricia [name] is a 72 y.o. 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 She has had atrial flutter ablated.  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  She is now status post AVR.  She had post procedure atrial fibrillation.  She had pleural effusions requiring thoracentesis.  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  She wakes up in the morning with a little shortness of breath.  This then seems to resolve through the day.  She stays active doing her gardening and household chores.  She is not having any resting shortness of breath, PND or orthopnea.  She is not having any chest pressure, neck or arm discomfort.  She sleeps with the head of her bed slightly elevated.  She has a little lower extremity swelling at the end of the day.  ROS: As stated in the HPI and negative for all other systems.  Studies Reviewed:    EKG:   EKG Interpretation Date/Time:  Friday October 20 2023 08:17:34 EST Ventricular Rate:  92 PR Interval:  212 QRS Duration:  150 QT Interval:  420 QTC Calculation: 519 R Axis:   106  Text Interpretation: Atrial-sensed ventricular-paced rhythm with prolonged AV conduction When compared with ECG of 29-Jun-2023 15:51, Vent. rate has decreased BY   7 BPM Confirmed by Rollene Rotunda ([id number]) on 10/20/2023 8:25:12 AM     Risk Assessment/Calculations:      Physical Exam:   VS:  BP (!) 142/78 (BP Location: Left Arm, Patient Position: Sitting, Cuff Size: Normal)   Pulse 94   Ht 5\' 6"  (1.676 m)   Wt 108 lb 9.6 oz (49.3 kg)   SpO2 99%   BMI 17.53 kg/m    Wt Readings from Last 3 Encounters:  10/20/23 108 lb 9.6 oz  (49.3 kg)  08/22/23 104 lb (47.2 kg)  08/17/23 104 lb (47.2 kg)     GEN:   Very thin appearing, well developed in no acute distress NECK: No JVD; No carotid bruits CARDIAC: RRR, 3 out of 6 holosystolic murmur heard at the third left interspace, probable diastolic murmur heard in the left upper sternal border murmurs, rubs, gallops RESPIRATORY:  Clear to auscultation without rales, wheezing or rhonchi  ABDOMEN: Soft, non-tender, non-distended EXTREMITIES:  No edema; No deformity   ASSESSMENT AND PLAN:   MVR: This was stable in June.  She understands endocarditis prophylaxis.  AVR:   This was stable post AVR.  No change in therapy.   Flutter:   She has had atrial flutter ablated.  She has had no symptomatic recurrence of this.  She is off of anticoagulation because of life-threatening serious bleeding in the past.  HTN:   The blood pressure is mildly elevated which is unusual.  She keep a blood pressure diary.  Bradycardia:  She had symptomatic HB and is status post PPM.  She saw Dr. Ladona Ridgel in July.   Pulmonic stenosis and regurgitation: I will follow this clinically  TR: He has severe TR.  At this point no change in therapy.       Follow up with me in January 2026 and see Dr. Ladona Ridgel in July  as scheduled 2025.    Signed, Rollene Rotunda, MD

## 2023-10-20 ENCOUNTER — Ambulatory Visit: Payer: Medicare PPO | Attending: Cardiology | Admitting: Cardiology

## 2023-10-20 ENCOUNTER — Encounter: Payer: Self-pay | Admitting: Cardiology

## 2023-10-20 VITALS — BP 142/78 | HR 94 | Ht 66.0 in | Wt 108.6 lb

## 2023-10-20 DIAGNOSIS — I10 Essential (primary) hypertension: Secondary | ICD-10-CM

## 2023-10-20 DIAGNOSIS — R001 Bradycardia, unspecified: Secondary | ICD-10-CM | POA: Diagnosis not present

## 2023-10-20 DIAGNOSIS — I4892 Unspecified atrial flutter: Secondary | ICD-10-CM | POA: Diagnosis not present

## 2023-10-20 DIAGNOSIS — I379 Nonrheumatic pulmonary valve disorder, unspecified: Secondary | ICD-10-CM | POA: Diagnosis not present

## 2023-10-20 DIAGNOSIS — Z952 Presence of prosthetic heart valve: Secondary | ICD-10-CM

## 2023-10-20 DIAGNOSIS — I272 Pulmonary hypertension, unspecified: Secondary | ICD-10-CM | POA: Diagnosis not present

## 2023-10-20 NOTE — Patient Instructions (Signed)
    Follow-Up: At [facility], you and your health needs are our priority.  As part of our continuing mission to provide you with exceptional heart care, we have created designated Provider Care Teams.  These Care Teams include your primary Cardiologist (physician) and Advanced Practice Providers (APPs -  Physician Assistants and Nurse Practitioners) who all work together to provide you with the care you need, when you need it.  We recommend signing up for the patient portal called "MyChart".  Sign up information is provided on this After Visit Summary.  MyChart is used to connect with patients for Virtual Visits (Telemedicine).  Patients are able to view lab/test results, encounter notes, upcoming appointments, etc.  Non-urgent messages can be sent to your provider as well.   To learn more about what you can do with MyChart, go to [email or web address].    Your next appointment:   6 month(s)  Provider:   Rollene Rotunda, MD  6 months after seeing Dr Ladona Ridgel

## 2023-10-23 ENCOUNTER — Telehealth: Payer: Self-pay | Admitting: Cardiology

## 2023-10-23 NOTE — Telephone Encounter (Signed)
Called and spoke to patient concerning her Torsemide 10 mg dose. Patient stated she has been taking 10 mg twice daily and wanted clarification. Advised patient according to her last note from 04/20/23 it stated Other: She was to reduce her Torsemide and I think this is reasonable to once a day and then as needed. -Decrease torsemide (demadex) to 10mg  once daily.  She will start taking medication as instructed.

## 2023-10-23 NOTE — Telephone Encounter (Signed)
Pt c/o medication issue:  1. Name of Medication: torsemide (DEMADEX) 10 MG tablet   2. How are you currently taking this medication (dosage and times per day)?   Take 1 tablet (10 mg total) by mouth daily.Patient taking differently: Take 10 mg by mouth 2 (two) times daily.    3. Are you having a reaction (difficulty breathing--STAT)? no  4. What is your medication issue? Patient states the dosage is different then what it was before. Calling with questions. Please advise  .

## 2023-10-24 DIAGNOSIS — H401132 Primary open-angle glaucoma, bilateral, moderate stage: Secondary | ICD-10-CM | POA: Diagnosis not present

## 2023-10-30 ENCOUNTER — Other Ambulatory Visit: Payer: Medicare PPO

## 2023-11-21 DIAGNOSIS — H409 Unspecified glaucoma: Secondary | ICD-10-CM | POA: Diagnosis not present

## 2023-11-21 DIAGNOSIS — M1990 Unspecified osteoarthritis, unspecified site: Secondary | ICD-10-CM | POA: Diagnosis not present

## 2023-11-21 DIAGNOSIS — K219 Gastro-esophageal reflux disease without esophagitis: Secondary | ICD-10-CM | POA: Diagnosis not present

## 2023-11-21 DIAGNOSIS — Z950 Presence of cardiac pacemaker: Secondary | ICD-10-CM | POA: Diagnosis not present

## 2023-11-21 DIAGNOSIS — I129 Hypertensive chronic kidney disease with stage 1 through stage 4 chronic kidney disease, or unspecified chronic kidney disease: Secondary | ICD-10-CM | POA: Diagnosis not present

## 2023-11-21 DIAGNOSIS — N1831 Chronic kidney disease, stage 3a: Secondary | ICD-10-CM | POA: Diagnosis not present

## 2023-11-21 DIAGNOSIS — J309 Allergic rhinitis, unspecified: Secondary | ICD-10-CM | POA: Diagnosis not present

## 2023-11-21 DIAGNOSIS — E039 Hypothyroidism, unspecified: Secondary | ICD-10-CM | POA: Diagnosis not present

## 2023-11-21 DIAGNOSIS — Z8701 Personal history of pneumonia (recurrent): Secondary | ICD-10-CM | POA: Diagnosis not present

## 2023-11-21 DIAGNOSIS — Z79890 Hormone replacement therapy (postmenopausal): Secondary | ICD-10-CM | POA: Diagnosis not present

## 2023-11-21 DIAGNOSIS — M545 Low back pain, unspecified: Secondary | ICD-10-CM | POA: Diagnosis not present

## 2023-11-21 DIAGNOSIS — I2510 Atherosclerotic heart disease of native coronary artery without angina pectoris: Secondary | ICD-10-CM | POA: Diagnosis not present

## 2023-11-21 DIAGNOSIS — Z79899 Other long term (current) drug therapy: Secondary | ICD-10-CM | POA: Diagnosis not present

## 2023-11-21 DIAGNOSIS — Z8572 Personal history of non-Hodgkin lymphomas: Secondary | ICD-10-CM | POA: Diagnosis not present

## 2023-11-21 DIAGNOSIS — Z953 Presence of xenogenic heart valve: Secondary | ICD-10-CM | POA: Diagnosis not present

## 2023-11-21 DIAGNOSIS — Z91011 Allergy to milk products: Secondary | ICD-10-CM | POA: Diagnosis not present

## 2023-11-21 DIAGNOSIS — Z681 Body mass index (BMI) 19 or less, adult: Secondary | ICD-10-CM | POA: Diagnosis not present

## 2023-11-30 DIAGNOSIS — J3801 Paralysis of vocal cords and larynx, unilateral: Secondary | ICD-10-CM | POA: Diagnosis not present

## 2023-11-30 DIAGNOSIS — R490 Dysphonia: Secondary | ICD-10-CM | POA: Diagnosis not present

## 2023-11-30 DIAGNOSIS — R1314 Dysphagia, pharyngoesophageal phase: Secondary | ICD-10-CM | POA: Diagnosis not present

## 2023-12-14 ENCOUNTER — Ambulatory Visit: Payer: Medicare PPO | Admitting: *Deleted

## 2023-12-14 DIAGNOSIS — Z0000 Encounter for general adult medical examination without abnormal findings: Secondary | ICD-10-CM | POA: Diagnosis not present

## 2023-12-14 NOTE — Patient Instructions (Signed)
 Patricia [name] , Thank you for taking time to come for your Medicare Wellness Visit. I appreciate your ongoing commitment to your health goals. Please review the following plan we discussed and let me know if I can assist you in the future.   Screening recommendations/referrals: Colonoscopy: no longer required Mammogram: up to date Bone Density: ordered Recommended yearly ophthalmology/optometry visit for glaucoma screening and checkup Recommended yearly dental visit for hygiene and checkup  Vaccinations: Influenza vaccine: up to date Pneumococcal vaccine: up to date Tdap vaccine: up to date Shingles vaccine: up to date    Advanced directives: yes   Preventive Care 65 Years and Older, Female Preventive care refers to lifestyle choices and visits with your health care provider that can promote health and wellness. What does preventive care include? A yearly physical exam. This is also called an annual well check. Dental exams once or twice a year. Routine eye exams. Ask your health care provider how often you should have your eyes checked. Personal lifestyle choices, including: Daily care of your teeth and gums. Regular physical activity. Eating a healthy diet. Avoiding tobacco and drug use. Limiting alcohol use. Practicing safe sex. Taking low-dose aspirin  every day. Taking vitamin and mineral supplements as recommended by your health care provider. What happens during an annual well check? The services and screenings done by your health care provider during your annual well check will depend on your age, overall health, lifestyle risk factors, and family history of disease. Counseling  Your health care provider may ask you questions about your: Alcohol use. Tobacco use. Drug use. Emotional well-being. Home and relationship well-being. Sexual activity. Eating habits. History of falls. Memory and ability to understand (cognition). Work and work astronomer. Reproductive  health. Screening  You may have the following tests or measurements: Height, weight, and BMI. Blood pressure. Lipid and cholesterol levels. These may be checked every 5 years, or more frequently if you are over 74 years old. Skin check. Lung cancer screening. You may have this screening every year starting at age 74 if you have a 30-pack-year history of smoking and currently smoke or have quit within the past 15 years. Fecal occult blood test (FOBT) of the stool. You may have this test every year starting at age 74. Flexible sigmoidoscopy or colonoscopy. You may have a sigmoidoscopy every 5 years or a colonoscopy every 10 years starting at age 74. Hepatitis C blood test. Hepatitis B blood test. Sexually transmitted disease (STD) testing. Diabetes screening. This is done by checking your blood sugar (glucose) after you have not eaten for a while (fasting). You may have this done every 1-3 years. Bone density scan. This is done to screen for osteoporosis. You may have this done starting at age 74. Mammogram. This may be done every 1-2 years. Talk to your health care provider about how often you should have regular mammograms. Talk with your health care provider about your test results, treatment options, and if necessary, the need for more tests. Vaccines  Your health care provider may recommend certain vaccines, such as: Influenza vaccine. This is recommended every year. Tetanus, diphtheria, and acellular pertussis (Tdap, Td) vaccine. You may need a Td booster every 10 years. Zoster vaccine. You may need this after age 74. Pneumococcal 13-valent conjugate (PCV13) vaccine. One dose is recommended after age 74. Pneumococcal polysaccharide (PPSV23) vaccine. One dose is recommended after age 74. Talk to your health care provider about which screenings and vaccines you need and how often you need them.  This information is not intended to replace advice given to you by your health care provider.  Make sure you discuss any questions you have with your health care provider. Document Released: 12/25/2015 Document Revised: 08/17/2016 Document Reviewed: 09/29/2015 Elsevier Interactive Patient Education  2017 [employer].  Fall Prevention in the Home Falls can cause injuries. They can happen to people of all ages. There are many things you can do to make your home safe and to help prevent falls. What can I do on the outside of my home? Regularly fix the edges of walkways and driveways and fix any cracks. Remove anything that might make you trip as you walk through a door, such as a raised step or threshold. Trim any bushes or trees on the path to your home. Use bright outdoor lighting. Clear any walking paths of anything that might make someone trip, such as rocks or tools. Regularly check to see if handrails are loose or broken. Make sure that both sides of any steps have handrails. Any raised decks and porches should have guardrails on the edges. Have any leaves, snow, or ice cleared regularly. Use sand or salt on walking paths during winter. Clean up any spills in your garage right away. This includes oil or grease spills. What can I do in the bathroom? Use night lights. Install grab bars by the toilet and in the tub and shower. Do not use towel bars as grab bars. Use non-skid mats or decals in the tub or shower. If you need to sit down in the shower, use a plastic, non-slip stool. Keep the floor dry. Clean up any water that spills on the floor as soon as it happens. Remove soap buildup in the tub or shower regularly. Attach bath mats securely with double-sided non-slip rug tape. Do not have throw rugs and other things on the floor that can make you trip. What can I do in the bedroom? Use night lights. Make sure that you have a light by your bed that is easy to reach. Do not use any sheets or blankets that are too big for your bed. They should not hang down onto the floor. Have a  firm chair that has side arms. You can use this for support while you get dressed. Do not have throw rugs and other things on the floor that can make you trip. What can I do in the kitchen? Clean up any spills right away. Avoid walking on wet floors. Keep items that you use a lot in easy-to-reach places. If you need to reach something above you, use a strong step stool that has a grab bar. Keep electrical cords out of the way. Do not use floor polish or wax that makes floors slippery. If you must use wax, use non-skid floor wax. Do not have throw rugs and other things on the floor that can make you trip. What can I do with my stairs? Do not leave any items on the stairs. Make sure that there are handrails on both sides of the stairs and use them. Fix handrails that are broken or loose. Make sure that handrails are as long as the stairways. Check any carpeting to make sure that it is firmly attached to the stairs. Fix any carpet that is loose or worn. Avoid having throw rugs at the top or bottom of the stairs. If you do have throw rugs, attach them to the floor with carpet tape. Make sure that you have a light switch at the  top of the stairs and the bottom of the stairs. If you do not have them, ask someone to add them for you. What else can I do to help prevent falls? Wear shoes that: Do not have high heels. Have rubber bottoms. Are comfortable and fit you well. Are closed at the toe. Do not wear sandals. If you use a stepladder: Make sure that it is fully opened. Do not climb a closed stepladder. Make sure that both sides of the stepladder are locked into place. Ask someone to hold it for you, if possible. Clearly mark and make sure that you can see: Any grab bars or handrails. First and last steps. Where the edge of each step is. Use tools that help you move around (mobility aids) if they are needed. These include: Canes. Walkers. Scooters. Crutches. Turn on the lights when you  go into a dark area. Replace any light bulbs as soon as they burn out. Set up your furniture so you have a clear path. Avoid moving your furniture around. If any of your floors are uneven, fix them. If there are any pets around you, be aware of where they are. Review your medicines with your doctor. Some medicines can make you feel dizzy. This can increase your chance of falling. Ask your doctor what other things that you can do to help prevent falls. This information is not intended to replace advice given to you by your health care provider. Make sure you discuss any questions you have with your health care provider. Document Released: 09/24/2009 Document Revised: 05/05/2016 Document Reviewed: 01/02/2015 Elsevier Interactive Patient Education  2017 [employer].

## 2023-12-14 NOTE — Progress Notes (Signed)
 Subjective:   Patricia [name] is a 73 y.o. female who presents for Medicare Annual (Subsequent) preventive examination.  Visit Complete: Virtual I connected with  Patricia [name] on 12/14/23 by a video and audio enabled telemedicine application and verified that I am speaking with the correct person using two identifiers.  Patient Location: Home  Provider Location: Home Office  I discussed the limitations of evaluation and management by telemedicine. The patient expressed understanding and agreed to proceed.  Vital Signs: Because this visit was a virtual/telehealth visit, some criteria may be missing or patient reported. Any vitals not documented were not able to be obtained and vitals that have been documented are patient reported.  Patient Medicare AWV questionnaire was completed by the patient on 12-08-2023; I have confirmed that all information answered by patient is correct and no changes since this date.  Cardiac Risk Factors include: advanced age (>48men, >47 women);hypertension;family history of premature cardiovascular disease     Objective:    There were no vitals filed for this visit. There is no height or weight on file to calculate BMI.     12/14/2023    2:03 PM 08/22/2023    8:20 AM 08/17/2023    1:18 PM 12/06/2022    2:08 PM 05/26/2022   11:00 AM 05/22/2022    4:59 PM 05/04/2022    9:39 AM  Advanced Directives  Does Patient Have a Medical Advance Directive? Yes Yes Yes Yes  Yes Yes  Type of Advance Directive Living will Healthcare Power of [place];Living will  Living will;Healthcare Power of [employer] Power of [employer] Power of Attorney Healthcare Power of Attorney  Does patient want to make changes to medical advance directive?  No - Patient declined  No - Patient declined No - Patient declined  Yes (MAU/Ambulatory/Procedural Areas - Information given)  Copy of Healthcare Power of Attorney in Chart?  No - copy requested  No - copy requested No -  copy requested No - copy requested, Physician notified   Would patient like information on creating a medical advance directive?    No - Patient declined       Current Medications (verified) Outpatient Encounter Medications as of 12/14/2023  Medication Sig   acetaminophen  (TYLENOL ) 325 MG tablet Take 2 tablets (650 mg total) by mouth 2 (two) times daily as needed for moderate pain or headache.   alendronate  (FOSAMAX ) 70 MG tablet Take 1 tablet (70 mg total) by mouth once a week. (Patient taking differently: Take 70 mg by mouth once a week. Sundays)   Blood Glucose Monitoring Suppl (ACCU-CHEK GUIDE) w/Device KIT 1 Units by Does not apply route daily.   Calcium  Carbonate (CALCIUM  600 PO) Take 600 mg by mouth daily. One a day   CREON  36000-114000 units CPEP capsule Take 36,000 Units by mouth 3 (three) times daily before meals.   esomeprazole (NEXIUM) 40 MG capsule Take 40 mg by mouth daily before breakfast.    fluticasone  (FLONASE ) 50 MCG/ACT nasal spray PLACE TWO SPRAYS INTO EACH NOSTRIL DAILY   glucose blood (ACCU-CHEK GUIDE) test strip USE TO TEST BLOOD SUGARS EVERY MORNING   Lancets (ACCU-CHEK SOFT TOUCH) lancets Check BS QAM   Lancets Misc. (ACCU-CHEK SOFTCLIX LANCET DEV) KIT Check BS QAM   levothyroxine  (SYNTHROID ) 75 MCG tablet Take 1 tablet (75 mcg total) by mouth daily. (Patient taking differently: Take 75 mcg by mouth daily before breakfast.)   metoprolol  tartrate (LOPRESSOR ) 100 MG tablet TAKE ONE TABLET BY MOUTH TWICE A  DAY   Multiple Vitamin (MULTIVITAMIN WITH MINERALS) TABS tablet Take 1 tablet by mouth daily with lunch. Centrum Silver.   torsemide  (DEMADEX ) 10 MG tablet Take 1 tablet (10 mg total) by mouth daily. (Patient taking differently: Take 10 mg by mouth 2 (two) times daily.)   ondansetron  (ZOFRAN ) 4 MG tablet Take 1 tablet (4 mg total) by mouth every 8 (eight) hours as needed for nausea or vomiting.   No facility-administered encounter medications on file as of 12/14/2023.     Allergies (verified) Aspirin , Azithromycin, Penicillins, Sulfa antibiotics, and Cheese   History: Past Medical History:  Diagnosis Date   Anemia    Aortic atherosclerosis (HCC)    Aortic insufficiency    a.) s/p AVR 04/07/2022 --> 19 mm Inspiris Resilia valve   Atrial fibrillation and flutter (HCC)    a.) s/p ablation 1998; b.) CHA2DS2-VASc: 4 (age, sex, vascular disease history, T2DM); b.) rate/rhythm maintained on oral metoprolol  tartrate; no chronic OAC   Basal cell carcinoma 06/05/2019   R low back, [facility] 07/16/19   Basal cell carcinoma 04/15/2010   Right post shoulder   Basal cell carcinoma 04/15/2010   Mid back   Basal cell carcinoma 07/13/2023   left top of shoulder, [facility] 09/26/23   Basal cell carcinoma (BCC) 09/29/2016   Left post shoulder. Superficial and nodular.   BCC (basal cell carcinoma) 07/21/2021   right mid back, [facility] 09/15/2021   BCC (basal cell carcinoma) 01/27/2022   left lower abdomen, [facility] 07/28/22   BCC (basal cell carcinoma) 07/28/2022   right lateral forehead, [facility] 08/10/22   BCC (basal cell carcinoma) 07/28/2022   left buttock, [facility] 08/10/22   Bilateral pleural effusion    Bleeding gastric ulcer 2000   Breast cancer, left (HCC) 2005   a.)  s/p LEFT mastectomy 2005 --> lymph nodes were (+) --> Tx'd with systemic antineoplastic chemotherapy   CAD (coronary artery disease)    a.) cCTA 04/05/2022: Ca2+ 23 (51st %'ile for age/sex/race matched control); 25-49% mLAD calcification   CHB (complete heart block) (HCC)    a.) symptomatic; s/p Medtronic Azure XT DR MRI SureScan PPM placement 09/19/2017   COPD (chronic obstructive pulmonary disease) (HCC)    Diet-controlled type 2 diabetes mellitus (HCC)    Dysphonia    Dyspnea    GERD (gastroesophageal reflux disease)    History of basal cell carcinoma (BCC) 03/22/2021   right neck and right medial ear, Moh's   History of bilateral cataract extraction    History of blood transfusion    Hodgkin's lymphoma (HCC)  1974   a.) s/p laparotomy for splenectomy and liver Bx in 1974 --> Tx'd with systemic chemotherapy + chest XRT --> recurrence 2 years later requiring additional systemic chemotherapy and XRT   Hypothyroidism    Lower leg edema    Mitral valve stenosis    a.) s/p minimally invasive MVR 09/13/2017 --> 25 mm Medtronic Mosaic porcine stented bioprosthetic tissue valve   NSVT (nonsustained ventricular tachycardia) (HCC)    Osteoporosis    lumbar verterbral fracture, left ankle fracture, dexa 2015   Pancreatitis    Pneumonia    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   a.) Medtronic Azure XT DR MRI SureScan [id number] (SN: [id number])   Pulmonary HTN (HCC)    a.) TTE: 05/14/14: RVSP 65; b.) TTE 08/31/15: RVSP 51, c.) TTE 12/23/16: RVSP 78; d.) RHC 06/27/17: mRA 6, mPA 37, mPCWP 18, AO sat 99, PA sat 67, CO 4.15, CI 2.43, PVR 4.6, AoV  peak-peak 12, mitral MPG 11; e.) TTE 11/10/17: RVSP 63, f.) TTE 10/19/18: RVSP 52; g.) TTE 10/09/20: RVSP 42.9; h.) TTE 12/22/21: RVSP 54.7; i.) TTE 05/17/22: 47.7; j.) TTE 05/26/22: RVSP 40.5; k.) TTE 05/30/23: RVSP 37.1   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/07/2022   a.) s/p AVR 04/07/2022 --> 19 mm Inspiris Resilia valve   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 09/13/2017   a.) 25 mm Medtronic Mosaic porcine stented bioprosthetic tissue valve   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   Left medial lower leg   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   right anterior thigh, [facility] 08/10/22   Syncope    Tachycardia    Past Surgical History:  Procedure Laterality Date   AORTIC VALVE REPLACEMENT N/A 04/07/2022   Procedure: AORTIC VALVE REPLACEMENT (AVR), USING INSPIRIS 19 MM AORTIC VALVE;  Surgeon: Lucas Dorise POUR, MD;  Location: [facility] OR;  Service: Open Heart Surgery;  Laterality: N/A;   ATRIAL FLUTTER ABLATION N/A 1998   BUBBLE STUDY  01/12/2022   Procedure: BUBBLE STUDY;  Surgeon: Barbaraann Darryle Ned, MD;   Location: [facility] ENDOSCOPY;  Service: Cardiovascular;;   CARDIAC ELECTROPHYSIOLOGY MAPPING AND ABLATION  1999   CATARACT EXTRACTION, BILATERAL     COLONOSCOPY     IR RADIOLOGY PERIPHERAL GUIDED IV START  08/03/2017   IR THORACENTESIS ASP PLEURAL SPACE W/IMG GUIDE  05/23/2022   IR THORACENTESIS ASP PLEURAL SPACE W/IMG GUIDE  05/24/2022   IR US  GUIDE VASC ACCESS RIGHT  08/03/2017   LAPAROTOMY N/A 1974   MASTECTOMY Left 2005   MICROLARYNGOSCOPY Bilateral 08/22/2023   Procedure: SUSPENSION MICROLARYNGOSCOPY WITH INJECTION;  Surgeon: Milissa Hamming, MD;  Location: [facility] ORS;  Service: ENT;  Laterality: Bilateral;   MITRAL VALVE REPAIR Right 09/13/2017   Procedure: MINIMALLY INVASIVE MITRAL VALVE REPLACEMENT (MVR) with Medtronic Mosaic Porcine Heart Valve size 25mm;  Surgeon: Dusty Sudie DEL, MD;  Location: [facility] OR;  Service: Open Heart Surgery;  Laterality: Right;   ORIF ANKLE FRACTURE Left 03/21/2014   Procedure: LEFT ANKLE FRACTURE OPEN TREATMENT BILMALLEOLAR INCLUDES INTERNAL FIXATION ;  Surgeon: Evalene JONETTA Chancy, MD;  Location: [facility];  Service: Orthopedics;  Laterality: Left;   PACEMAKER IMPLANT N/A 09/19/2017   Medtronic Azure XT MRI conditional dual-chamber pacemaker for symptomatic complete heart block  by Dr Kelsie   RIGHT/LEFT HEART CATH AND CORONARY ANGIOGRAPHY N/A 06/27/2017   Procedure: Right/Left Heart Cath and Coronary Angiography;  Surgeon: Rolan Ezra RAMAN, MD;  Location: [facility] INVASIVE CV LAB;  Service: Cardiovascular;  Laterality: N/A;   SPLENECTOMY  1974   hodgkins disease   TEE WITHOUT CARDIOVERSION  06/12/2012   Procedure: TRANSESOPHAGEAL ECHOCARDIOGRAM (TEE);  Surgeon: Ezra RAMAN Rolan, MD;  Location: [facility] ENDOSCOPY;  Service: Cardiovascular;  Laterality: N/A;   TEE WITHOUT CARDIOVERSION N/A 06/29/2017   Procedure: TRANSESOPHAGEAL ECHOCARDIOGRAM (TEE);  Surgeon: Jeffrie Oneil BROCKS, MD;  Location: [facility] ENDOSCOPY;  Service: Cardiovascular;  Laterality: N/A;   TEE WITHOUT  CARDIOVERSION N/A 09/13/2017   Procedure: TRANSESOPHAGEAL ECHOCARDIOGRAM (TEE);  Surgeon: Dusty Sudie DEL, MD;  Location: [facility] OR;  Service: Open Heart Surgery;  Laterality: N/A;   TEE WITHOUT CARDIOVERSION N/A 01/12/2022   Procedure: TRANSESOPHAGEAL ECHOCARDIOGRAM (TEE);  Surgeon: Barbaraann Darryle Ned, MD;  Location: [facility] ENDOSCOPY;  Service: Cardiovascular;  Laterality: N/A;   TEE WITHOUT CARDIOVERSION N/A 04/07/2022   Procedure: TRANSESOPHAGEAL ECHOCARDIOGRAM (TEE);  Surgeon: Lucas Dorise POUR, MD;  Location: [facility] OR;  Service: Open Heart Surgery;  Laterality: N/A;  TONSILLECTOMY AND ADENOIDECTOMY     Family History  Problem Relation Age of Onset   Rheumatic fever Father        Died suddenly age 57   Diabetes Father    Cancer Father        colon   Heart disease Father        rheumatic fever x 3   Arthritis Maternal Grandmother    Hypertension Maternal Grandmother    Diabetes Paternal Grandmother    Vision loss Paternal Grandmother        glaucoma   Social History   Socioeconomic History   Marital status: Widowed    Spouse name: Not on file   Number of children: Not on file   Years of education: Not on file   Highest education level: Professional school degree (e.g., MD, DDS, DVM, JD)  Occupational History   Occupation: Runner, Broadcasting/film/video (retired)  Tobacco Use   Smoking status: Never   Smokeless tobacco: Never  Vaping Use   Vaping status: Never Used  Substance and Sexual Activity   Alcohol use: No   Drug use: No   Sexual activity: Yes  Other Topics Concern   Not on file  Social History Narrative   Husband passed away from Cancer in 09/02/2017.   Social Drivers of Corporate Investment Banker Strain: Low Risk  (12/14/2023)   Overall Financial Resource Strain (CARDIA)    Difficulty of Paying Living Expenses: Not hard at all  Food Insecurity: No Food Insecurity (12/14/2023)   Hunger Vital Sign    Worried About Running Out of Food in the Last Year: Never true    Ran Out of Food in the  Last Year: Never true  Transportation Needs: No Transportation Needs (12/14/2023)   PRAPARE - Administrator, Civil Service (Medical): No    Lack of Transportation (Non-Medical): No  Physical Activity: Insufficiently Active (12/08/2023)   Exercise Vital Sign    Days of Exercise per Week: 3 days    Minutes of Exercise per Session: 20 min  Stress: No Stress Concern Present (12/14/2023)   [employer] of Occupational Health - Occupational Stress Questionnaire    Feeling of Stress : Not at all  Social Connections: Moderately Integrated (12/14/2023)   Social Connection and Isolation Panel [NHANES]    Frequency of Communication with Friends and Family: More than three times a week    Frequency of Social Gatherings with Friends and Family: Three times a week    Attends Religious Services: More than 4 times per year    Active Member of Clubs or Organizations: Yes    Attends Banker Meetings: More than 4 times per year    Marital Status: Widowed    Tobacco Counseling Counseling given: Not Answered   Clinical Intake:  Pre-visit preparation completed: Yes  Pain : No/denies pain     Diabetes: No  How often do you need to have someone help you when you read instructions, pamphlets, or other written materials from your doctor or pharmacy?: 1 - Never     Information entered by :: Mliss Graff LPN   Activities of Daily Living    12/14/2023    2:02 PM 12/08/2023    8:40 AM  In your present state of health, do you have any difficulty performing the following activities:  Hearing? 0 0  Vision? 0 0  Difficulty concentrating or making decisions? 0 0  Walking or climbing stairs? 0 1  Dressing or bathing? 0  0  Doing errands, shopping? 0 0  Preparing Food and eating ? N N  Using the Toilet? N N  In the past six months, have you accidently leaked urine? N N  Do you have problems with loss of bowel control? N N  Managing your Medications? N N  Managing your  Finances? N N  Housekeeping or managing your Housekeeping? N N    Patient Care Team: Duanne Butler DASEN, MD as PCP - General (Family Medicine) Lavona Agent, MD as PCP - Cardiology (Cardiology) Waddell Danelle ORN, MD as PCP - Electrophysiology (Cardiology) Kristie Lamprey, MD as Consulting Physician (Gastroenterology) Lavona Agent, MD as Consulting Physician (Cardiology) Lucas Dorise POUR, MD as Consulting Physician (Cardiothoracic Surgery) Leslee Reusing, MD as Consulting Physician (Ophthalmology)  Indicate any recent Medical Services you may have received from other than [facility] providers in the past year (date may be approximate).     Assessment:   This is a routine wellness examination for Patricia [name].  Hearing/Vision screen Hearing Screening - Comments:: No trouble hearing Vision Screening - Comments:: [place] Up to date   Goals Addressed             This Visit's Progress    Patient Stated       Maintain current lifestyle       Depression Screen    12/14/2023    2:07 PM 12/14/2023    2:06 PM 04/11/2023    3:02 PM 12/06/2022    2:10 PM 08/01/2022    9:53 AM 05/12/2022   11:34 AM 03/22/2021    2:17 PM  PHQ 2/9 Scores  PHQ - 2 Score 0 2 0 0 0 0 0  PHQ- 9 Score 0 3   1 8      Fall Risk    12/14/2023    2:00 PM 12/08/2023    8:40 AM 04/11/2023    3:01 PM 12/02/2022    8:40 AM 11/04/2022    9:22 AM  Fall Risk   Falls in the past year? 1 1 0 0 0  Number falls in past yr: 0 0 0 0 0  Injury with Fall? 0 1 0 0 0  Risk for fall due to :   No Fall Risks    Follow up Falls evaluation completed;Education provided;Falls prevention discussed  Falls prevention discussed      MEDICARE RISK AT HOME: Medicare Risk at Home Any stairs in or around the home?: Yes If so, are there any without handrails?: No Home free of loose throw rugs in walkways, pet beds, electrical cords, etc?: Yes Adequate lighting in your home to reduce risk of falls?: Yes Life alert?: Yes Use of a cane, walker  or w/c?: No Grab bars in the bathroom?: Yes Shower chair or bench in shower?: Yes Elevated toilet seat or a handicapped toilet?: No  TIMED UP AND GO:  Was the test performed?  No    Cognitive Function:        12/14/2023    2:03 PM 12/06/2022    2:09 PM  6CIT Screen  What Year? 0 points 0 points  What month? 0 points 0 points  What time? 0 points 0 points  Count back from 20 0 points 0 points  Months in reverse 0 points 0 points  Repeat phrase 0 points 0 points  Total Score 0 points 0 points    Immunizations Immunization History  Administered Date(s) Administered   Fluad Quad(high Dose 65+) 09/08/2022, 08/30/2023   Influenza Split 09/15/2015  Influenza, High Dose Seasonal PF 09/16/2017, 08/02/2019   Influenza,inj,Quad PF,6+ Mos 09/15/2016   Influenza-Unspecified 08/03/2018, 09/03/2020, 09/07/2021   PFIZER(Purple Top)SARS-COV-2 Vaccination 01/02/2020, 01/24/2020, 09/26/2020, 08/11/2023   Pfizer Covid-19 Vaccine Bivalent Booster 73yrs & up 04/14/2021   Pneumococcal Conjugate-13 07/07/2016   Pneumococcal Polysaccharide-23 01/23/2018   Tdap 12/10/2021   Zoster Recombinant(Shingrix) 08/03/2018, 12/17/2018    TDAP status: Up to date  Flu Vaccine status: Up to date  Pneumococcal vaccine status: Up to date  Covid-19 vaccine status: Completed vaccines  Qualifies for Shingles Vaccine? No   Zostavax completed Yes   Shingrix Completed?: Yes  Screening Tests Health Maintenance  Topic Date Due   COVID-19 Vaccine (6 - 2024-25 season) 12/30/2023 (Originally 10/06/2023)   FOOT EXAM  01/14/2024 (Originally 10/29/1961)   HEMOGLOBIN A1C  01/14/2024 (Originally 10/05/2022)   OPHTHALMOLOGY EXAM  04/04/2024 (Originally 10/29/1961)   Diabetic kidney evaluation - Urine ACR  12/13/2028 (Originally 10/29/1969)   MAMMOGRAM  08/14/2024   Diabetic kidney evaluation - eGFR measurement  08/17/2024   Medicare Annual Wellness (AWV)  12/13/2024   DTaP/Tdap/Td (2 - Td or Tdap) 12/11/2031    Colonoscopy  02/26/2033   Pneumonia Vaccine 72+ Years old  Completed   INFLUENZA VACCINE  Completed   DEXA SCAN  Completed   Hepatitis C Screening  Completed   Zoster Vaccines- Shingrix  Completed   HPV VACCINES  Aged Out    Health Maintenance  There are no preventive care reminders to display for this patient.   Colorectal cancer screening: No longer required.   Mammogram status: Completed  . Repeat every year  Bone Density status: Ordered  . Pt provided with contact info and advised to call to schedule appt.  Lung Cancer Screening: (Low Dose CT Chest recommended if Age 59-80 years, 20 pack-year currently smoking OR have quit w/in 15years.) does not qualify.   Lung Cancer Screening Referral:   Additional Screening:  Hepatitis C Screening: does not qualify; Completed 2019  Vision Screening: Recommended annual ophthalmology exams for early detection of glaucoma and other disorders of the eye. Is the patient up to date with their annual eye exam?  Yes  Who is the provider or what is the name of the office in which the patient attends annual eye exams? [facility] If pt is not established with a provider, would they like to be referred to a provider to establish care? No .   Dental Screening: Recommended annual dental exams for proper oral hygiene    Community Resource Referral / Chronic Care Management: CRR required this visit?  No   CCM required this visit?  No     Plan:     I have personally reviewed and noted the following in the patient's chart:   Medical and social history Use of alcohol, tobacco or illicit drugs  Current medications and supplements including opioid prescriptions. Patient is not currently taking opioid prescriptions. Functional ability and status Nutritional status Physical activity Advanced directives List of other physicians Hospitalizations, surgeries, and ER visits in previous 12 months Vitals Screenings to include cognitive, depression,  and falls Referrals and appointments  In addition, I have reviewed and discussed with patient certain preventive protocols, quality metrics, and best practice recommendations. A written personalized care plan for preventive services as well as general preventive health recommendations were provided to patient.     Mliss Graff, LPN   07/18/7973   After Visit Summary: (MyChart) Due to this being a telephonic visit, the after visit summary with patients personalized  plan was offered to patient via MyChart   Nurse Notes:

## 2023-12-19 ENCOUNTER — Ambulatory Visit: Payer: Medicare PPO | Attending: Otolaryngology | Admitting: Speech Pathology

## 2023-12-19 DIAGNOSIS — R1310 Dysphagia, unspecified: Secondary | ICD-10-CM | POA: Diagnosis not present

## 2023-12-19 DIAGNOSIS — R490 Dysphonia: Secondary | ICD-10-CM | POA: Diagnosis not present

## 2023-12-19 DIAGNOSIS — T17908A Unspecified foreign body in respiratory tract, part unspecified causing other injury, initial encounter: Secondary | ICD-10-CM | POA: Insufficient documentation

## 2023-12-19 NOTE — Therapy (Signed)
 OUTPATIENT SPEECH LANGUAGE PATHOLOGY  VOICE EVALUATION Clinical Swallow Evaluation   Patient Name: Patricia [name] MRN: [id number] DOB:07-02-1951, 73 y.o., female Today's Date: 12/19/2023  PCP: Butler Burr, MD REFERRING PROVIDER: Carolee Hunter, MD   End of Session - 12/19/23 1007     Visit Number 1    Number of Visits 17    Date for SLP Re-Evaluation 02/13/24    Authorization Type [employer] Choice PPO    Progress Note Due on Visit 10    SLP Start Time 0930    SLP Stop Time  1007    SLP Time Calculation (min) 37 min    Activity Tolerance Patient tolerated treatment well             Past Medical History:  Diagnosis Date   Anemia    Aortic atherosclerosis (HCC)    Aortic insufficiency    a.) s/p AVR 04/07/2022 --> 19 mm Inspiris Resilia valve   Atrial fibrillation and flutter (HCC)    a.) s/p ablation 1998; b.) CHA2DS2-VASc: 4 (age, sex, vascular disease history, T2DM); b.) rate/rhythm maintained on oral metoprolol  tartrate; no chronic OAC   Basal cell carcinoma 06/05/2019   R low back, [facility] 07/16/19   Basal cell carcinoma 04/15/2010   Right post shoulder   Basal cell carcinoma 04/15/2010   Mid back   Basal cell carcinoma 07/13/2023   left top of shoulder, [facility] 09/26/23   Basal cell carcinoma (BCC) 09/29/2016   Left post shoulder. Superficial and nodular.   BCC (basal cell carcinoma) 07/21/2021   right mid back, [facility] 09/15/2021   BCC (basal cell carcinoma) 01/27/2022   left lower abdomen, [facility] 07/28/22   BCC (basal cell carcinoma) 07/28/2022   right lateral forehead, [facility] 08/10/22   BCC (basal cell carcinoma) 07/28/2022   left buttock, [facility] 08/10/22   Bilateral pleural effusion    Bleeding gastric ulcer 2000   Breast cancer, left (HCC) 2005   a.)  s/p LEFT mastectomy 2005 --> lymph nodes were (+) --> Tx'd with systemic antineoplastic chemotherapy   CAD (coronary artery disease)    a.) cCTA 04/05/2022: Ca2+ 23 (51st %'ile for age/sex/race matched control);  25-49% mLAD calcification   CHB (complete heart block) (HCC)    a.) symptomatic; s/p Medtronic Azure XT DR MRI SureScan PPM placement 09/19/2017   COPD (chronic obstructive pulmonary disease) (HCC)    Diet-controlled type 2 diabetes mellitus (HCC)    Dysphonia    Dyspnea    GERD (gastroesophageal reflux disease)    History of basal cell carcinoma (BCC) 03/22/2021   right neck and right medial ear, Moh's   History of bilateral cataract extraction    History of blood transfusion    Hodgkin's lymphoma (HCC) 1974   a.) s/p laparotomy for splenectomy and liver Bx in 1974 --> Tx'd with systemic chemotherapy + chest XRT --> recurrence 2 years later requiring additional systemic chemotherapy and XRT   Hypothyroidism    Lower leg edema    Mitral valve stenosis    a.) s/p minimally invasive MVR 09/13/2017 --> 25 mm Medtronic Mosaic porcine stented bioprosthetic tissue valve   NSVT (nonsustained ventricular tachycardia) (HCC)    Osteoporosis    lumbar verterbral fracture, left ankle fracture, dexa 2015   Pancreatitis    Pneumonia    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   a.) Medtronic Azure XT DR MRI SureScan [id number] (SN: [id number])   Pulmonary HTN (HCC)    a.) TTE: 05/14/14: RVSP 65; b.) TTE 08/31/15:  RVSP 51, c.) TTE 12/23/16: RVSP 78; d.) RHC 06/27/17: mRA 6, mPA 37, mPCWP 18, AO sat 99, PA sat 67, CO 4.15, CI 2.43, PVR 4.6, AoV peak-peak 12, mitral MPG 11; e.) TTE 11/10/17: RVSP 63, f.) TTE 10/19/18: RVSP 52; g.) TTE 10/09/20: RVSP 42.9; h.) TTE 12/22/21: RVSP 54.7; i.) TTE 05/17/22: 47.7; j.) TTE 05/26/22: RVSP 40.5; k.) TTE 05/30/23: RVSP 37.1   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/07/2022   a.) s/p AVR 04/07/2022 --> 19 mm Inspiris Resilia valve   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 09/13/2017   a.) 25 mm Medtronic Mosaic porcine stented bioprosthetic tissue valve   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   Left medial lower leg   Squamous  cell carcinoma in situ (SCCIS) 07/28/2022   right anterior thigh, EDC 08/10/22   Syncope    Tachycardia    Past Surgical History:  Procedure Laterality Date   AORTIC VALVE REPLACEMENT N/A 04/07/2022   Procedure: AORTIC VALVE REPLACEMENT (AVR), USING INSPIRIS 19 MM AORTIC VALVE;  Surgeon: Lucas Dorise POUR, MD;  Location: [facility] OR;  Service: Open Heart Surgery;  Laterality: N/A;   ATRIAL FLUTTER ABLATION N/A 1998   BUBBLE STUDY  01/12/2022   Procedure: BUBBLE STUDY;  Surgeon: Barbaraann Darryle Ned, MD;  Location: [facility] ENDOSCOPY;  Service: Cardiovascular;;   CARDIAC ELECTROPHYSIOLOGY MAPPING AND ABLATION  1999   CATARACT EXTRACTION, BILATERAL     COLONOSCOPY     IR RADIOLOGY PERIPHERAL GUIDED IV START  08/03/2017   IR THORACENTESIS ASP PLEURAL SPACE W/IMG GUIDE  05/23/2022   IR THORACENTESIS ASP PLEURAL SPACE W/IMG GUIDE  05/24/2022   IR US  GUIDE VASC ACCESS RIGHT  08/03/2017   LAPAROTOMY N/A 1974   MASTECTOMY Left 2005   MICROLARYNGOSCOPY Bilateral 08/22/2023   Procedure: SUSPENSION MICROLARYNGOSCOPY WITH INJECTION;  Surgeon: Milissa Hamming, MD;  Location: [facility] ORS;  Service: ENT;  Laterality: Bilateral;   MITRAL VALVE REPAIR Right 09/13/2017   Procedure: MINIMALLY INVASIVE MITRAL VALVE REPLACEMENT (MVR) with Medtronic Mosaic Porcine Heart Valve size 25mm;  Surgeon: Dusty Sudie DEL, MD;  Location: [facility] OR;  Service: Open Heart Surgery;  Laterality: Right;   ORIF ANKLE FRACTURE Left 03/21/2014   Procedure: LEFT ANKLE FRACTURE OPEN TREATMENT BILMALLEOLAR INCLUDES INTERNAL FIXATION ;  Surgeon: Evalene JONETTA Chancy, MD;  Location: [facility];  Service: Orthopedics;  Laterality: Left;   PACEMAKER IMPLANT N/A 09/19/2017   Medtronic Azure XT MRI conditional dual-chamber pacemaker for symptomatic complete heart block  by Dr Kelsie   RIGHT/LEFT HEART CATH AND CORONARY ANGIOGRAPHY N/A 06/27/2017   Procedure: Right/Left Heart Cath and Coronary Angiography;  Surgeon: Rolan Ezra RAMAN, MD;   Location: [facility] INVASIVE CV LAB;  Service: Cardiovascular;  Laterality: N/A;   SPLENECTOMY  1974   hodgkins disease   TEE WITHOUT CARDIOVERSION  06/12/2012   Procedure: TRANSESOPHAGEAL ECHOCARDIOGRAM (TEE);  Surgeon: Ezra RAMAN Rolan, MD;  Location: [facility] ENDOSCOPY;  Service: Cardiovascular;  Laterality: N/A;   TEE WITHOUT CARDIOVERSION N/A 06/29/2017   Procedure: TRANSESOPHAGEAL ECHOCARDIOGRAM (TEE);  Surgeon: Jeffrie Oneil BROCKS, MD;  Location: [facility] ENDOSCOPY;  Service: Cardiovascular;  Laterality: N/A;   TEE WITHOUT CARDIOVERSION N/A 09/13/2017   Procedure: TRANSESOPHAGEAL ECHOCARDIOGRAM (TEE);  Surgeon: Dusty Sudie DEL, MD;  Location: [facility] OR;  Service: Open Heart Surgery;  Laterality: N/A;   TEE WITHOUT CARDIOVERSION N/A 01/12/2022   Procedure: TRANSESOPHAGEAL ECHOCARDIOGRAM (TEE);  Surgeon: Barbaraann Darryle Ned, MD;  Location: [facility] ENDOSCOPY;  Service: Cardiovascular;  Laterality: N/A;   TEE  WITHOUT CARDIOVERSION N/A 04/07/2022   Procedure: TRANSESOPHAGEAL ECHOCARDIOGRAM (TEE);  Surgeon: Lucas Dorise POUR, MD;  Location: [facility] OR;  Service: Open Heart Surgery;  Laterality: N/A;   TONSILLECTOMY AND ADENOIDECTOMY     Patient Active Problem List   Diagnosis Date Noted   NSVT (nonsustained ventricular tachycardia) (HCC) 06/29/2023   Paroxysmal atrial fibrillation (HCC) 06/29/2023   Pulmonary valve disease 04/18/2023   Pleural effusion 05/22/2022   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/07/2022   Pacemaker 03/08/2022   CHB (complete heart block) (HCC) 12/27/2021   S/P MVR (mitral valve repair) 10/02/2019   Pneumonia    Hodgkin's lymphoma (HCC)    GERD (gastroesophageal reflux disease)    COPD (chronic obstructive pulmonary disease) (HCC)    Atrial flutter (HCC)    Breast cancer (HCC)    Bleeding gastric ulcer    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 09/13/2017   Pulmonary HTN (HCC) 06/25/2017   Mitral valve stenosis 04/25/2017   Aortic valve regurgitation 04/25/2017    Osteoporosis    Pancreatitis    Prediabetes    Acute respiratory failure with hypoxia (HCC) 01/07/2015   Hodgkin lymphoma (HCC) 01/07/2015   Abdominal pain, epigastric    Diarrhea    AKI (acute kidney injury) (HCC)    Hyponatremia    Acute pancreatitis 12/25/2014   Leukocytosis    Mitral regurgitation 06/07/2012   Syncope 04/26/2012   HTN (hypertension) 04/26/2012   Abnormal stress test 04/26/2012   Hyperkalemia 04/26/2012   Tachycardia 04/26/2012    ONSET DATE:  04/06/2022; date of referral 11/30/2023  REFERRING DIAG: Dysphonia (R49.0); 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 On 08/22/2023 pt underwent bilateral injection of Prolaryn VOICE GEL d/t more bowing noted on left on laryngoscopy during procedure than appreciate in office. On 11/30/2023 laryngoscopy revealed moderate glottic gap with phonation and mild weakness of right vocal fold. Further ENT not reported pt continue to have dysphonia with moderate glottic gap. Initially right vocal fold was immobile then twitching at the time of injection with voice gel. Now with pretty good movement. No improvement per patient in dysphonia and choking. Continues to have dysphagia and coughing and swallowing issues. Reviewed MBSS. Will make speech referral.  Of note, pt with MBSS on 04/19/2023 [pt presents with mild oropharyngeal swallow that is without penetration or aspiration of any tested consistency (thin, nectar thick, honey thick, puree, solid, 13mm barium  tablet). SLP suspects that patient's sensation of choking with foods is related to likely slow transit of heavier, more dense boluses and possibly brief stasis of these boluses within pharynx.   CT soft tissue neck 04/25/2023 1. Symmetric appearance of the glottis, no detected cord paresis.    PAIN:  Are you having pain? No   FALLS: Has patient fallen in last 6 months? No,  LIVING ENVIRONMENT: Lives with: lives alone Lives in: House/apartment  PLOF: Independent  PATIENT GOALS    to improve vocal quality and swallow ability  OBJECTIVE:  COGNITION: Overall cognitive status: Within functional limits for tasks assessed  SOCIAL HISTORY: Occupation: retired Publishing Rights Manager intake: optimal Caffeine/alcohol intake:  minimal Daily voice use: minimal Environmental risks: None reported Occupational risks: Runner, Broadcasting/film/video and Other: previous teacher Misuse: Excessively high pitch, Strain, and Tension Phonotraumatic behaviors: Excessive and/or habitual throat clearing  PERCEPTUAL VOICE ASSESSMENT: Voice quality: hoarse, rough, strained, diplophonia, and vocal fatigue Vocal abuse: habitual throat clearing and habitual abnormal pitch Resonance: normal Respiratory function: clavicular breathing   ORAL MOTOR EXAMINATION Facial : WFL Lingual: WFL Velum: WFL Mandible: WFL Cough: WFL Voice: Hoarse, Strained, Breathy, Weak   PATIENT REPORTED OUTCOME MEASURES (PROM):  VOICE HANDICAP INDEX (VHI)  The Voice Handicap Index is comprised of a series of questions to assess the patient's perception of their voice. It is designed to evaluate the emotional, physical and functional components of the voice problem.  Functional: 26 Physical: 28 Emotional: 15 Total: 69 (Normal mean 8.75, SD =14.97)  z score =  4.02 severe = 3.00+,   Reflux Symptom Index These are statements many people have used to describe their voices and the effects of their voices on their lives. In the last  month, how did the  following problems affect you?   Hoarseness or a problem with your voice 4=Severe problem  Clearing your throat 3+Moderate problem  Excess throat mucous or postnasal drip 4=Severe problem  Difficulty swallowing food, liquids, or pills 4=Severe problem  Coughing after you eat or after lying down 5=Problem as bad as it can be Breathing difficulties or choking episodes 4=Severe problem  Troublesome or annoying cough 3+Moderate problem  Sensation of something sticking in your throat or a lump in your throat 4=Severe problem  Heartburn, chest pain, indigestion, or stomach acid coming up 1=Very mild problem   Total: 32    Normative data suggests that an RSI of greater than or equal to 13 is clinically significant  Therefore, an RSI > 13 may be indicative of significant reflux disease.  EATING ASSESSMENT TOOL (EAT-10)   The patient was asked to rate to what extent the following statements are problematic on a scale of 0-4. 0 = No problem; 4 = Severe problem. A total score of 3 or higher is considered abnormal.  1.) My swallowing problem has caused me to lose weight. 2 2.) My swallowing problem interferes with my ability to go out for meals. 3 3.) Swallowing liquids takes extra effort. 3 4.) Swallowing solids takes extra effort. 3  5.) Swallowing pills takes extra effort. 4  6.) Swallowing is painful. 0 7.) The pleasure of eating is affected by my swallowing. 3 8.) When I swallow food sticks in my throat. 4  9.) I cough when I eat. 4  10.) Swallowing is stressful. 3   TOTAL SCORE: 29     TODAY'S TREATMENT:  N/A   PATIENT EDUCATION: Education details: results of this assessment, ST POC Person educated: Patient Education method: Explanation Education comprehension: needs further education   HOME EXERCISE PROGRAM: N/A     GOALS: Goals reviewed with patient? Yes  SHORT TERM GOALS: Target date: 10 sessions  Patient will participate in objective swallowing evaluation  (MBSS) to identify safest diet recommendation as well as therapeutic targets.  Baseline: Goal status: INITIAL  2.  With Supervision A, pt will demonstrate understanding of result and recommendations of MBS by verbalizing salient points. Baseline:  Goal status: INITIAL  3.  The patient will demonstrate abdominal breathing patterns and steady release of breath on exhalation to optimize efficiency of voicing and decrease laryngeal hyperfunction.  Baseline:  Goal status: INITIAL   LONG TERM GOALS: Target date: 02/13/2024  Patient will improve perception of swallowing as indicated by an improvement in EAT-10 score by 12 weeks from initial swallowing therapy session.  Baseline: score = 29 Goal status: INITIAL  2.  The patient will be independent for abdominal breathing and breath support exercises.  Baseline:  Goal status: INITIAL  3.  Patient will report improved communication effectiveness as measured by PROM Baseline:  Goal status: INITIAL  4.  The patient will participate in 15-20 minutes conversation, maintaining average loudness of 75 dB and loud, good quality voice.  Baseline:  Goal status: INITIAL  ASSESSMENT:  CLINICAL IMPRESSION: Patient is a 73 y.o. female who was seen today for a clinical swallow and voice evaluation. Pt presents with functional oropharyngeal abilities when consuming thin liquids x 8 oz. However, after consumption, pt with mild throat clear and cough. Question s/s of aspiration vs reflux. Will request order for a Modified Barium Swallow Study for further assessment.   In addition, pt presents with mild dysphonia that is c/b decreased respiratory support and clavicular breathing, strained, raspy and hoarse vocal quality.   OBJECTIVE IMPAIRMENTS include voice disorder and dysphagia. These impairments are limiting patient from effectively communicating at home and in community and safety when swallowing. Factors affecting potential to achieve goals and  functional outcome are medical prognosis, severity of impairments, and time post onset and surgical intervention . Patient will benefit from skilled SLP services to address above impairments and improve overall function.  REHAB POTENTIAL: Good  PLAN: SLP FREQUENCY: 1-2x/week  SLP DURATION: 8 weeks  PLANNED INTERVENTIONS: Aspiration precaution training, Diet toleration management , SLP instruction and feedback, Compensatory strategies, Patient/family education, [phone] Treatment of speech (30 or 45 min) , and [id number] Treatment of swallowing function  Declyn Delsol B. Rubbie, M.S., CCC-SLP, Tree Surgeon Certified Brain Injury Specialist [facility]  [facility] Rehabilitation Services Office [phone] Ascom [phone] Fax [phone]

## 2023-12-20 DIAGNOSIS — M47816 Spondylosis without myelopathy or radiculopathy, lumbar region: Secondary | ICD-10-CM | POA: Diagnosis not present

## 2023-12-22 ENCOUNTER — Ambulatory Visit: Payer: Medicare PPO | Admitting: Speech Pathology

## 2023-12-22 DIAGNOSIS — T17908A Unspecified foreign body in respiratory tract, part unspecified causing other injury, initial encounter: Secondary | ICD-10-CM | POA: Diagnosis not present

## 2023-12-22 DIAGNOSIS — R490 Dysphonia: Secondary | ICD-10-CM | POA: Diagnosis not present

## 2023-12-22 DIAGNOSIS — R1310 Dysphagia, unspecified: Secondary | ICD-10-CM | POA: Diagnosis not present

## 2023-12-22 NOTE — Therapy (Signed)
 OUTPATIENT SPEECH LANGUAGE PATHOLOGY  VOICE TREATMENT NOTE   Patient Name: Patricia [name] MRN: [id number] DOB:[birth year], 73 y.o., female Today's Date: 12/22/2023  PCP: Butler Burr, MD REFERRING PROVIDER: Carolee Hunter, MD   End of Session - 12/22/23 [phone]     Visit Number 2    Number of Visits 17    Date for SLP Re-Evaluation 02/13/24    Authorization Type [employer] PPO    Progress Note Due on Visit 10    SLP Start Time [phone]    SLP Stop Time  0915    SLP Time Calculation (min) 40 min    Activity Tolerance Patient tolerated treatment well             Past Medical History:  Diagnosis Date   Anemia    Aortic atherosclerosis (HCC)    Aortic insufficiency    a.) s/p AVR 04/07/2022 --> 19 mm Inspiris Resilia valve   Atrial fibrillation and flutter (HCC)    a.) s/p ablation 1998; b.) CHA2DS2-VASc: 4 (age, sex, vascular disease history, T2DM); b.) rate/rhythm maintained on oral metoprolol  tartrate; no chronic OAC   Basal cell carcinoma 06/05/2019   R low back, [facility] 07/16/19   Basal cell carcinoma 04/15/2010   Right post shoulder   Basal cell carcinoma 04/15/2010   Mid back   Basal cell carcinoma 07/13/2023   left top of shoulder, [facility] 09/26/23   Basal cell carcinoma (BCC) 09/29/2016   Left post shoulder. Superficial and nodular.   BCC (basal cell carcinoma) 07/21/2021   right mid back, [facility] 09/15/2021   BCC (basal cell carcinoma) 01/27/2022   left lower abdomen, [facility] 07/28/22   BCC (basal cell carcinoma) 07/28/2022   right lateral forehead, [facility] 08/10/22   BCC (basal cell carcinoma) 07/28/2022   left buttock, [facility] 08/10/22   Bilateral pleural effusion    Bleeding gastric ulcer 2000   Breast cancer, left (HCC) 2005   a.)  s/p LEFT mastectomy 2005 --> lymph nodes were (+) --> Tx'd with systemic antineoplastic chemotherapy   CAD (coronary artery disease)    a.) cCTA 04/05/2022: Ca2+ 23 (51st %'ile for age/sex/race matched control); 25-49% mLAD  calcification   CHB (complete heart block) (HCC)    a.) symptomatic; s/p Medtronic Azure XT DR MRI SureScan PPM placement 09/19/2017   COPD (chronic obstructive pulmonary disease) (HCC)    Diet-controlled type 2 diabetes mellitus (HCC)    Dysphonia    Dyspnea    GERD (gastroesophageal reflux disease)    History of basal cell carcinoma (BCC) 03/22/2021   right neck and right medial ear, Moh's   History of bilateral cataract extraction    History of blood transfusion    Hodgkin's lymphoma (HCC) 1974   a.) s/p laparotomy for splenectomy and liver Bx in 1974 --> Tx'd with systemic chemotherapy + chest XRT --> recurrence 2 years later requiring additional systemic chemotherapy and XRT   Hypothyroidism    Lower leg edema    Mitral valve stenosis    a.) s/p minimally invasive MVR 09/13/2017 --> 25 mm Medtronic Mosaic porcine stented bioprosthetic tissue valve   NSVT (nonsustained ventricular tachycardia) (HCC)    Osteoporosis    lumbar verterbral fracture, left ankle fracture, dexa 2015   Pancreatitis    Pneumonia    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   a.) Medtronic Azure XT DR MRI SureScan [id number] (SN: [id number])   Pulmonary HTN (HCC)    a.) TTE: 05/14/14: RVSP 65; b.) TTE 08/31/15: RVSP 51,  c.) TTE 12/23/16: RVSP 78; d.) RHC 06/27/17: mRA 6, mPA 37, mPCWP 18, AO sat 99, PA sat 67, CO 4.15, CI 2.43, PVR 4.6, AoV peak-peak 12, mitral MPG 11; e.) TTE 11/10/17: RVSP 63, f.) TTE 10/19/18: RVSP 52; g.) TTE 10/09/20: RVSP 42.9; h.) TTE 12/22/21: RVSP 54.7; i.) TTE 05/17/22: 47.7; j.) TTE 05/26/22: RVSP 40.5; k.) TTE 05/30/23: RVSP 37.1   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/07/2022   a.) s/p AVR 04/07/2022 --> 19 mm Inspiris Resilia valve   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 09/13/2017   a.) 25 mm Medtronic Mosaic porcine stented bioprosthetic tissue valve   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 ENDOSCOPY;  Service: Cardiovascular;;   CARDIAC ELECTROPHYSIOLOGY MAPPING AND ABLATION  1999   CATARACT EXTRACTION, BILATERAL     COLONOSCOPY     IR RADIOLOGY PERIPHERAL GUIDED IV START  08/03/2017   IR THORACENTESIS ASP PLEURAL SPACE W/IMG GUIDE  05/23/2022   IR THORACENTESIS ASP PLEURAL SPACE W/IMG GUIDE  05/24/2022   IR US  GUIDE VASC ACCESS RIGHT  08/03/2017   LAPAROTOMY N/A 1974   MASTECTOMY Left 2005   MICROLARYNGOSCOPY Bilateral 08/22/2023   Procedure: SUSPENSION MICROLARYNGOSCOPY WITH INJECTION;  Surgeon: Milissa Hamming, MD;  Location: [facility] ORS;  Service: ENT;  Laterality: Bilateral;   MITRAL VALVE REPAIR Right 09/13/2017   Procedure: MINIMALLY INVASIVE MITRAL VALVE REPLACEMENT (MVR) with Medtronic Mosaic Porcine Heart Valve size 25mm;  Surgeon: Dusty Sudie DEL, MD;  Location: [facility] OR;  Service: Open Heart Surgery;  Laterality: Right;   ORIF ANKLE FRACTURE Left 03/21/2014   Procedure: LEFT ANKLE FRACTURE OPEN TREATMENT BILMALLEOLAR INCLUDES INTERNAL FIXATION ;  Surgeon: Evalene JONETTA Chancy, MD;  Location: [facility];  Service: Orthopedics;  Laterality: Left;   PACEMAKER IMPLANT N/A 09/19/2017   Medtronic Azure XT MRI conditional dual-chamber pacemaker for symptomatic complete heart block  by Dr Kelsie   RIGHT/LEFT HEART CATH AND CORONARY ANGIOGRAPHY N/A 06/27/2017   Procedure: Right/Left Heart Cath and Coronary Angiography;  Surgeon: Rolan Ezra RAMAN, MD;  Location:  [facility] INVASIVE CV LAB;  Service: Cardiovascular;  Laterality: N/A;   SPLENECTOMY  1974   hodgkins disease   TEE WITHOUT CARDIOVERSION  06/12/2012   Procedure: TRANSESOPHAGEAL ECHOCARDIOGRAM (TEE);  Surgeon: Ezra RAMAN Rolan, MD;  Location: [facility] ENDOSCOPY;  Service: Cardiovascular;  Laterality: N/A;   TEE WITHOUT CARDIOVERSION N/A 06/29/2017   Procedure: TRANSESOPHAGEAL ECHOCARDIOGRAM (TEE);  Surgeon: Jeffrie Oneil BROCKS, MD;  Location: [facility] ENDOSCOPY;  Service: Cardiovascular;  Laterality: N/A;   TEE WITHOUT CARDIOVERSION N/A 09/13/2017   Procedure: TRANSESOPHAGEAL ECHOCARDIOGRAM (TEE);  Surgeon: Dusty Sudie DEL, MD;  Location: [facility] OR;  Service: Open Heart Surgery;  Laterality: N/A;   TEE WITHOUT CARDIOVERSION N/A 01/12/2022   Procedure: TRANSESOPHAGEAL ECHOCARDIOGRAM (TEE);  Surgeon: Barbaraann Darryle Ned, MD;  Location: [facility] ENDOSCOPY;  Service: Cardiovascular;  Laterality: N/A;   TEE WITHOUT CARDIOVERSION  N/A 04/07/2022   Procedure: TRANSESOPHAGEAL ECHOCARDIOGRAM (TEE);  Surgeon: Lucas Dorise POUR, MD;  Location: [facility] OR;  Service: Open Heart Surgery;  Laterality: N/A;   TONSILLECTOMY AND ADENOIDECTOMY     Patient Active Problem List   Diagnosis Date Noted   NSVT (nonsustained ventricular tachycardia) (HCC) 06/29/2023   Paroxysmal atrial fibrillation (HCC) 06/29/2023   Pulmonary valve disease 04/18/2023   Pleural effusion 05/22/2022   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/07/2022   Pacemaker 03/08/2022   CHB (complete heart block) (HCC) 12/27/2021   S/P MVR (mitral valve repair) 10/02/2019   Pneumonia    Hodgkin's lymphoma (HCC)    GERD (gastroesophageal reflux disease)    COPD (chronic obstructive pulmonary disease) (HCC)    Atrial flutter (HCC)    Breast cancer (HCC)    Bleeding gastric ulcer    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 09/13/2017   Pulmonary HTN (HCC) 06/25/2017   Mitral valve stenosis 04/25/2017   Aortic valve regurgitation 04/25/2017   Osteoporosis     Pancreatitis    Prediabetes    Acute respiratory failure with hypoxia (HCC) 01/07/2015   Hodgkin lymphoma (HCC) 01/07/2015   Abdominal pain, epigastric    Diarrhea    AKI (acute kidney injury) (HCC)    Hyponatremia    Acute pancreatitis 12/25/2014   Leukocytosis    Mitral regurgitation 06/07/2012   Syncope 04/26/2012   HTN (hypertension) 04/26/2012   Abnormal stress test 04/26/2012   Hyperkalemia 04/26/2012   Tachycardia 04/26/2012    ONSET DATE:  04/06/2022; date of referral 11/30/2023  REFERRING DIAG: Dysphonia (R49.0); 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 On 08/22/2023 pt underwent bilateral injection of Prolaryn VOICE GEL d/t more bowing noted on left on laryngoscopy during procedure than appreciate in office. On 11/30/2023 laryngoscopy revealed moderate glottic gap with phonation and mild weakness of right vocal fold. Further ENT not reported pt continue to have dysphonia with moderate glottic gap. Initially right vocal fold was immobile then twitching at the time of injection with voice gel. Now with pretty good movement. No improvement per patient in dysphonia and choking. Continues to have dysphagia and coughing and swallowing issues. Reviewed MBSS. Will make speech referral.  Of note, pt with MBSS on 04/19/2023 [pt presents with mild oropharyngeal swallow that is without penetration or aspiration of any tested consistency (thin, nectar thick, honey thick, puree, solid, 13mm barium tablet). SLP suspects that patient's sensation of choking with foods is related to likely slow transit of heavier, more dense boluses and possibly brief stasis of these boluses within  pharynx.   CT soft tissue neck 04/25/2023 1. Symmetric appearance of the glottis, no detected cord paresis.    PAIN:  Are you having pain? No   FALLS: Has patient fallen in last 6 months? No,  LIVING ENVIRONMENT: Lives with: lives alone Lives in: House/apartment  PLOF: Independent  SUBJECTIVE STATEMENT: Pt pleasant, eager, arrived for session earlier, so session started earlier Pt accompanied by: self  PATIENT GOALS    to improve vocal quality and swallow ability  OBJECTIVE:   TODAY'S TREATMENT:  Skilled treatment session focused on pt's dysphonia goals. SLP facilitated session by providing the following interventions:  Sustained ah maximum phonation time: 8.1 seconds Sustained ah  loudness average: 68 dB Average fundamental frequency during sustained "ah":181.3 Hz   (2.3 SD below average of  244 Hz +/- 27 for gender)  Oral reading (passage) loudness average: 75 dB Oral reading loudness range: 23 dB (61 - 84 dB) Conversational pitch average: 196 Hz Highest dynamic pitch in conversational speech: 264 Hz Lowest dynamic pitch in conversational speech: 136 Hz Conversational pitch range: 129 Hz Conversational loudness average: 75 dB Conversational loudness range: 23 dB  SLP further facilitated session by providing skilled written and verbal education on How the Voice Works with plan to target respiratory support initially. SLP further instructed pt in abdominal breathing with pt able to progress to independence in 10 out of 10 trials when placing one hand on her chest and one hand on her abdomen. She was also stimulable for progression to /s/ as well as pulsing /s/. With rare Min A, pt able to produce with steady/controlled airflow.    PATIENT EDUCATION: Education details: results of this assessment, ST POC Person educated: Patient Education method: Explanation Education comprehension: needs further education   HOME EXERCISE PROGRAM: Abdominal breathing Sustained  /s/ using abdominal breathing Pulsing /s/     GOALS: Goals reviewed with patient? Yes  SHORT TERM GOALS: Target date: 10 sessions  Patient will participate in objective swallowing evaluation (MBSS) to identify safest diet recommendation as well as therapeutic targets.  Baseline: Goal status: INITIAL  2.  With Supervision A, pt will demonstrate understanding of result and recommendations of MBS by verbalizing salient points. Baseline:  Goal status: INITIAL  3.  The patient will demonstrate abdominal breathing patterns and steady release of breath on exhalation to optimize efficiency of voicing and decrease laryngeal hyperfunction.  Baseline:  Goal status: INITIAL   LONG TERM GOALS: Target date: 02/13/2024  Patient will improve perception of swallowing as indicated by an improvement in EAT-10 score by 12 weeks from initial swallowing therapy session.  Baseline: score = 29 Goal status: INITIAL  2.  The patient will be independent for abdominal breathing and breath support exercises.  Baseline:  Goal status: INITIAL  3.  Patient will report improved communication effectiveness as measured by PROM Baseline:  Goal status: INITIAL  4.  The patient will participate in 15-20 minutes conversation, maintaining average loudness of 75 dB and loud, good quality voice.  Baseline:  Goal status: INITIAL  ASSESSMENT:  CLINICAL IMPRESSION: Patient is a 73 y.o. female who was seen today for a behavioral voice treatment session. She continues to present with mild dysphonia that is c/b decreased respiratory support and clavicular breathing, strained, raspy and hoarse vocal quality.   Throughout the session, pt was eager and able to produce abdominal breathing techniques with improved independence. See the above treatment note for details.   OBJECTIVE IMPAIRMENTS include voice disorder and dysphagia. These impairments are limiting patient from effectively communicating at home and in  community and safety when swallowing. Factors affecting potential to achieve goals and functional outcome are medical prognosis, severity of impairments, and time post onset and surgical intervention . Patient will benefit from skilled SLP services to address above impairments and improve overall function.  REHAB POTENTIAL: Good  PLAN: SLP FREQUENCY: 1-2x/week  SLP DURATION: 8 weeks  PLANNED INTERVENTIONS: Aspiration precaution training, Diet toleration management , SLP instruction and feedback, Compensatory strategies, Patient/family education, [phone] Treatment of speech (30 or 45 min) , and [phone] Treatment of swallowing function  Tashari Schoenfelder B. Rubbie, M.S., CCC-SLP, CBIS Speech-Language Pathologist Certified Brain Injury Specialist [facility]  [facility] Rehabilitation Services Office [phone] Ascom [phone] Fax [phone]

## 2023-12-26 ENCOUNTER — Ambulatory Visit (INDEPENDENT_AMBULATORY_CARE_PROVIDER_SITE_OTHER): Payer: Medicare PPO

## 2023-12-26 DIAGNOSIS — I442 Atrioventricular block, complete: Secondary | ICD-10-CM

## 2023-12-26 LAB — CUP PACEART REMOTE DEVICE CHECK
Battery Remaining Longevity: 37 mo
Battery Voltage: 2.93 V
Brady Statistic AP VP Percent: 8.03 %
Brady Statistic AP VS Percent: 0 %
Brady Statistic AS VP Percent: 91.96 %
Brady Statistic AS VS Percent: 0.01 %
Brady Statistic RA Percent Paced: 8.01 %
Brady Statistic RV Percent Paced: 99.99 %
Date Time Interrogation Session: 20250113234226
Implantable Lead Connection Status: 753985
Implantable Lead Connection Status: 753985
Implantable Lead Implant Date: 20181009
Implantable Lead Implant Date: 20181009
Implantable Lead Location: 753859
Implantable Lead Location: 753860
Implantable Lead Model: 5076
Implantable Lead Model: 5076
Implantable Pulse Generator Implant Date: 20181009
Lead Channel Impedance Value: 247 Ohm
Lead Channel Impedance Value: 266 Ohm
Lead Channel Impedance Value: 342 Ohm
Lead Channel Impedance Value: 380 Ohm
Lead Channel Pacing Threshold Amplitude: 0.75 V
Lead Channel Pacing Threshold Amplitude: 1 V
Lead Channel Pacing Threshold Pulse Width: 0.4 ms
Lead Channel Pacing Threshold Pulse Width: 0.4 ms
Lead Channel Sensing Intrinsic Amplitude: 2.625 mV
Lead Channel Sensing Intrinsic Amplitude: 2.625 mV
Lead Channel Sensing Intrinsic Amplitude: 4.375 mV
Lead Channel Sensing Intrinsic Amplitude: 4.375 mV
Lead Channel Setting Pacing Amplitude: 2 V
Lead Channel Setting Pacing Amplitude: 2.5 V
Lead Channel Setting Pacing Pulse Width: 0.4 ms
Lead Channel Setting Sensing Sensitivity: 2 mV
Zone Setting Status: 755011
Zone Setting Status: 755011

## 2023-12-27 ENCOUNTER — Ambulatory Visit: Payer: Medicare PPO | Admitting: Speech Pathology

## 2023-12-27 DIAGNOSIS — R490 Dysphonia: Secondary | ICD-10-CM | POA: Diagnosis not present

## 2023-12-27 DIAGNOSIS — R1310 Dysphagia, unspecified: Secondary | ICD-10-CM | POA: Diagnosis not present

## 2023-12-27 DIAGNOSIS — T17908A Unspecified foreign body in respiratory tract, part unspecified causing other injury, initial encounter: Secondary | ICD-10-CM | POA: Diagnosis not present

## 2023-12-27 NOTE — Therapy (Signed)
OUTPATIENT SPEECH LANGUAGE PATHOLOGY  VOICE TREATMENT NOTE   Patient Name: Patricia [name] MRN: [id number] DOB:1951-02-14, 73 y.o., female Today's Date: 12/27/2023  PCP: Lynnea Ferrier, MD REFERRING PROVIDER: Bud Face, MD   End of Session - 12/27/23 0925     Visit Number 3    Number of Visits 17    Date for SLP Re-Evaluation 02/13/24    Authorization Type [employer] Choice PPO    Progress Note Due on Visit 10    SLP Start Time 0930    SLP Stop Time  1015    SLP Time Calculation (min) 45 min    Activity Tolerance Patient tolerated treatment well             Past Medical History:  Diagnosis Date   Anemia    Aortic atherosclerosis (HCC)    Aortic insufficiency    a.) s/p AVR 04/07/2022 --> 19 mm Inspiris Resilia valve   Atrial fibrillation and flutter (HCC)    a.) s/p ablation 1998; b.) CHA2DS2-VASc: 4 (age, sex, vascular disease history, T2DM); b.) rate/rhythm maintained on oral metoprolol tartrate; no chronic OAC   Basal cell carcinoma 06/05/2019   R low back, ED[facility] 07/16/19   Basal cell carcinoma 04/15/2010   Right post shoulder   Basal cell carcinoma 04/15/2010   Mid back   Basal cell carcinoma 07/13/2023   left top of shoulder, [facility] 09/26/23   Basal cell carcinoma (BCC) 09/29/2016   Left post shoulder. Superficial and nodular.   BCC (basal cell carcinoma) 07/21/2021   right mid back, [facility] 09/15/2021   BCC (basal cell carcinoma) 01/27/2022   left lower abdomen, [facility] 07/28/22   BCC (basal cell carcinoma) 07/28/2022   right lateral forehead, [facility] 08/10/22   BCC (basal cell carcinoma) 07/28/2022   left buttock, ED[facility] 08/10/22   Bilateral pleural effusion    Bleeding gastric ulcer 2000   Breast cancer, left (HCC) 2005   a.)  s/p LEFT mastectomy 2005 --> lymph nodes were (+) --> Tx'd with systemic antineoplastic chemotherapy   CAD (coronary artery disease)    a.) cCTA 04/05/2022: Ca2+ 23 (51st %'ile for age/sex/race matched control); 25-49% mLAD  calcification   CHB (complete heart block) (HCC)    a.) symptomatic; s/p Medtronic Azure XT DR MRI SureScan PPM placement 09/19/2017   COPD (chronic obstructive pulmonary disease) (HCC)    Diet-controlled type 2 diabetes mellitus (HCC)    Dysphonia    Dyspnea    GERD (gastroesophageal reflux disease)    History of basal cell carcinoma (BCC) 03/22/2021   right neck and right medial ear, Moh's   History of bilateral cataract extraction    History of blood transfusion    Hodgkin's lymphoma (HCC) 1974   a.) s/p laparotomy for splenectomy and liver Bx in 1974 --> Tx'd with systemic chemotherapy + chest XRT --> recurrence 2 years later requiring additional systemic chemotherapy and XRT   Hypothyroidism    Lower leg edema    Mitral valve stenosis    a.) s/p minimally invasive MVR 09/13/2017 --> 25 mm Medtronic Mosaic porcine stented bioprosthetic tissue valve   NSVT (nonsustained ventricular tachycardia) (HCC)    Osteoporosis    lumbar verterbral fracture, left ankle fracture, dexa 2015   Pancreatitis    Pneumonia    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   a.) Medtronic Azure XT DR MRI SureScan [id number] (SN: [id number])   Pulmonary HTN (HCC)    a.) TTE: 05/14/14: RVSP 65; b.) TTE 08/31/15: RVSP 51,  c.) TTE 12/23/16: RVSP 78; d.) RHC 06/27/17: mRA 6, mPA 37, mPCWP 18, AO sat 99, PA sat 67, CO 4.15, CI 2.43, PVR 4.6, AoV peak-peak 12, mitral MPG 11; e.) TTE 11/10/17: RVSP 63, f.) TTE 10/19/18: RVSP 52; g.) TTE 10/09/20: RVSP 42.9; h.) TTE 12/22/21: RVSP 54.7; i.) TTE 05/17/22: 47.7; j.) TTE 05/26/22: RVSP 40.5; k.) TTE 05/30/23: RVSP 37.1   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/07/2022   a.) s/p AVR 04/07/2022 --> 19 mm Inspiris Resilia valve   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 09/13/2017   a.) 25 mm Medtronic Mosaic porcine stented bioprosthetic tissue valve   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   Left medial lower leg   Squamous cell  carcinoma in situ (SCCIS) 07/28/2022   right anterior thigh, EDC 08/10/22   Syncope    Tachycardia    Past Surgical History:  Procedure Laterality Date   AORTIC VALVE REPLACEMENT N/A 04/07/2022   Procedure: AORTIC VALVE REPLACEMENT (AVR), USING INSPIRIS 19 MM AORTIC VALVE;  Surgeon: Alleen Borne, MD;  Location: [facility] OR;  Service: Open Heart Surgery;  Laterality: N/A;   ATRIAL FLUTTER ABLATION N/A 1998   BUBBLE STUDY  01/12/2022   Procedure: BUBBLE STUDY;  Surgeon: Sande Rives, MD;  Location: [facility] ENDOSCOPY;  Service: Cardiovascular;;   CARDIAC ELECTROPHYSIOLOGY MAPPING AND ABLATION  1999   CATARACT EXTRACTION, BILATERAL     COLONOSCOPY     IR RADIOLOGY PERIPHERAL GUIDED IV START  08/03/2017   IR THORACENTESIS ASP PLEURAL SPACE W/IMG GUIDE  05/23/2022   IR THORACENTESIS ASP PLEURAL SPACE W/IMG GUIDE  05/24/2022   IR US GUIDE VASC ACCESS RIGHT  08/03/2017   LAPAROTOMY N/A 1974   MASTECTOMY Left 2005   MICROLARYNGOSCOPY Bilateral 08/22/2023   Procedure: SUSPENSION MICROLARYNGOSCOPY WITH INJECTION;  Surgeon: Bud Face, MD;  Location: [facility] ORS;  Service: ENT;  Laterality: Bilateral;   MITRAL VALVE REPAIR Right 09/13/2017   Procedure: MINIMALLY INVASIVE MITRAL VALVE REPLACEMENT (MVR) with Medtronic Mosaic Porcine Heart Valve size 25mm;  Surgeon: Purcell Nails, MD;  Location: [facility] OR;  Service: Open Heart Surgery;  Laterality: Right;   ORIF ANKLE FRACTURE Left 03/21/2014   Procedure: LEFT ANKLE FRACTURE OPEN TREATMENT BILMALLEOLAR INCLUDES INTERNAL FIXATION ;  Surgeon: Sheral Apley, MD;  Location: [facility];  Service: Orthopedics;  Laterality: Left;   PACEMAKER IMPLANT N/A 09/19/2017   Medtronic Azure XT MRI conditional dual-chamber pacemaker for symptomatic complete heart block  by Dr Johney Frame   RIGHT/LEFT HEART CATH AND CORONARY ANGIOGRAPHY N/A 06/27/2017   Procedure: Right/Left Heart Cath and Coronary Angiography;  Surgeon: Laurey Morale, MD;  Location:  [facility] INVASIVE CV LAB;  Service: Cardiovascular;  Laterality: N/A;   SPLENECTOMY  1974   hodgkins disease   TEE WITHOUT CARDIOVERSION  06/12/2012   Procedure: TRANSESOPHAGEAL ECHOCARDIOGRAM (TEE);  Surgeon: Laurey Morale, MD;  Location: [facility] ENDOSCOPY;  Service: Cardiovascular;  Laterality: N/A;   TEE WITHOUT CARDIOVERSION N/A 06/29/2017   Procedure: TRANSESOPHAGEAL ECHOCARDIOGRAM (TEE);  Surgeon: Jake Bathe, MD;  Location: [facility] ENDOSCOPY;  Service: Cardiovascular;  Laterality: N/A;   TEE WITHOUT CARDIOVERSION N/A 09/13/2017   Procedure: TRANSESOPHAGEAL ECHOCARDIOGRAM (TEE);  Surgeon: Purcell Nails, MD;  Location: [facility] OR;  Service: Open Heart Surgery;  Laterality: N/A;   TEE WITHOUT CARDIOVERSION N/A 01/12/2022   Procedure: TRANSESOPHAGEAL ECHOCARDIOGRAM (TEE);  Surgeon: Sande Rives, MD;  Location: [facility] ENDOSCOPY;  Service: Cardiovascular;  Laterality: N/A;   TEE WITHOUT CARDIOVERSION  N/A 04/07/2022   Procedure: TRANSESOPHAGEAL ECHOCARDIOGRAM (TEE);  Surgeon: Alleen Borne, MD;  [facility];  Service: Open Heart Surgery;  Laterality: N/A;   TONSILLECTOMY AND ADENOIDECTOMY     Patient Active Problem List   Diagnosis Date Noted   NSVT (nonsustained ventricular tachycardia) (HCC) 06/29/2023   Paroxysmal atrial fibrillation (HCC) 06/29/2023   Pulmonary valve disease 04/18/2023   Pleural effusion 05/22/2022   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/07/2022   Pacemaker 03/08/2022   CHB (complete heart block) (HCC) 12/27/2021   S/P MVR (mitral valve repair) 10/02/2019   Pneumonia    Hodgkin's lymphoma (HCC)    GERD (gastroesophageal reflux disease)    COPD (chronic obstructive pulmonary disease) (HCC)    Atrial flutter (HCC)    Breast cancer (HCC)    Bleeding gastric ulcer    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 09/13/2017   Pulmonary HTN (HCC) 06/25/2017   Mitral valve stenosis 04/25/2017   Aortic valve regurgitation 04/25/2017   Osteoporosis     Pancreatitis    Prediabetes    Acute respiratory failure with hypoxia (HCC) 01/07/2015   Hodgkin lymphoma (HCC) 01/07/2015   Abdominal pain, epigastric    Diarrhea    AKI (acute kidney injury) (HCC)    Hyponatremia    Acute pancreatitis 12/25/2014   Leukocytosis    Mitral regurgitation 06/07/2012   Syncope 04/26/2012   HTN (hypertension) 04/26/2012   Abnormal stress test 04/26/2012   Hyperkalemia 04/26/2012   Tachycardia 04/26/2012    ONSET DATE:  04/06/2022; date of referral 11/30/2023  REFERRING DIAG: Dysphonia (R49.0); 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 On 08/22/2023 pt underwent bilateral injection of Prolaryn VOICE GEL d/t "more bowing noted on left on laryngoscopy during procedure than appreciate in office. On 11/30/2023 laryngoscopy revealed moderate glottic gap with phonation and mild weakness of right vocal fold. Further ENT not reported pt "continue to have dysphonia with moderate glottic gap. Initially right vocal fold was immobile then twitching at the time of injection with voice gel. Now with pretty good movement. No improvement per patient in dysphonia and choking. Continues to have dysphagia and coughing and swallowing issues. Reviewed MBSS. Will make speech referral."  Of note, pt with MBSS on 04/19/2023 [pt presents with mild oropharyngeal swallow that is without penetration or aspiration of any tested consistency (thin, nectar thick, honey thick, puree, solid, 13mm barium tablet). SLP suspects that patient's sensation of "choking" with foods is related to likely slow transit of heavier, more dense boluses and possibly brief stasis of these boluses within  pharynx.   CT soft tissue neck 04/25/2023 1. Symmetric appearance of the glottis, no detected cord paresis.    PAIN:  Are you having pain? No   FALLS: Has patient fallen in last 6 months? No,  LIVING ENVIRONMENT: Lives with: lives alone Lives in: House/apartment  PLOF: Independent  SUBJECTIVE STATEMENT: Pt pleasant, eager, discussing recent snow Pt accompanied by: self  PATIENT GOALS    to improve vocal quality and swallow ability  OBJECTIVE:   TODAY'S TREATMENT:  Skilled treatment session focused on pt's dysphonia goals. SLP facilitated session by providing the following interventions:  Skilled verbal and written education provided on throat clearing and ways to suppress throat  clear. Pt with increased self-monitoring on throat clearing. "I feel like something is there." Sensation reduced with swallow.   SLP further engaged pt in pulsing /s/ with moderate faded to minimal assistance. Pt able to participate in increased vocal intensity drills utilizing "ah" and counting to improve vocal quality. Min A faded to rare supervision cues.    PATIENT EDUCATION: Education details: throat clearing, see the above Person educated: Patient Education method: Explanation Education comprehension: needs further education   HOME EXERCISE PROGRAM: Abdominal breathing Sustained /s/ using abdominal breathing Pulsing /s/ Suppression of throat clears  GOALS: Goals reviewed with patient? Yes  SHORT TERM GOALS: Target date: 10 sessions  Patient will participate in objective swallowing evaluation (MBSS) to identify safest diet recommendation as well as therapeutic targets.  Baseline: Goal status: INITIAL  2.  With Supervision A, pt will demonstrate understanding of result and recommendations of MBS by verbalizing salient points. Baseline:  Goal status: INITIAL  3.  The patient will demonstrate abdominal breathing patterns and steady release of breath on exhalation to optimize  efficiency of voicing and decrease laryngeal hyperfunction.  Baseline:  Goal status: INITIAL   LONG TERM GOALS: Target date: 02/13/2024  Patient will improve perception of swallowing as indicated by an improvement in EAT-10 score by 12 weeks from initial swallowing therapy session.  Baseline: score = 29 Goal status: INITIAL  2.  The patient will be independent for abdominal breathing and breath support exercises.  Baseline:  Goal status: INITIAL  3.  Patient will report improved communication effectiveness as measured by PROM Baseline:  Goal status: INITIAL  4.  The patient will participate in 15-20 minutes conversation, maintaining average loudness of 75 dB and loud, good quality voice.  Baseline:  Goal status: INITIAL  ASSESSMENT:  CLINICAL IMPRESSION: Patient is a 73 y.o. female who was seen today for a behavioral voice treatment session. She continues to present with mild dysphonia that is c/b decreased respiratory support and clavicular breathing, strained, raspy and hoarse vocal quality.   Pt eager with ability to improve vocal quality. See the above treatment note for details.   OBJECTIVE IMPAIRMENTS include voice disorder and dysphagia. These impairments are limiting patient from effectively communicating at home and in community and safety when swallowing. Factors affecting potential to achieve goals and functional outcome are medical prognosis, severity of impairments, and time post onset and surgical intervention . Patient will benefit from skilled SLP services to address above impairments and improve overall function.  REHAB POTENTIAL: Good  PLAN: SLP FREQUENCY: 1-2x/week  SLP DURATION: 8 weeks  PLANNED INTERVENTIONS: Aspiration precaution training, Diet toleration management , SLP instruction and feedback, Compensatory strategies, Patient/family education, [phone] Treatment of speech (30 or 45 min) , and [id number] Treatment of swallowing function  Johnston Maddocks B. Dreama Saa,  M.S., CCC-SLP, Tree surgeon Certified Brain Injury Specialist [facility]  [facility] Services Office [phone] Ascom [phone] Fax [phone]

## 2023-12-29 ENCOUNTER — Ambulatory Visit: Payer: Medicare PPO | Admitting: Speech Pathology

## 2023-12-29 DIAGNOSIS — T17908A Unspecified foreign body in respiratory tract, part unspecified causing other injury, initial encounter: Secondary | ICD-10-CM | POA: Diagnosis not present

## 2023-12-29 DIAGNOSIS — R490 Dysphonia: Secondary | ICD-10-CM

## 2023-12-29 DIAGNOSIS — R1310 Dysphagia, unspecified: Secondary | ICD-10-CM | POA: Diagnosis not present

## 2023-12-29 NOTE — Therapy (Unsigned)
OUTPATIENT SPEECH LANGUAGE PATHOLOGY  VOICE TREATMENT NOTE   Patient Name: Patricia [name] MRN: [id number] DOB:19-Mar-1951, 73 y.o., female Today's Date: 12/29/2023  PCP: Lynnea Ferrier, MD REFERRING PROVIDER: Bud Face, MD   End of Session - 12/29/23 1500     Visit Number 4    Number of Visits 17    Date for SLP Re-Evaluation 02/13/24    Authorization Type [employer] Choice PPO    Progress Note Due on Visit 10    SLP Start Time 0845    SLP Stop Time  0930    SLP Time Calculation (min) 45 min    Activity Tolerance Patient tolerated treatment well             Past Medical History:  Diagnosis Date   Anemia    Aortic atherosclerosis (HCC)    Aortic insufficiency    a.) s/p AVR 04/07/2022 --> 19 mm Inspiris Resilia valve   Atrial fibrillation and flutter (HCC)    a.) s/p ablation 1998; b.) CHA2DS2-VASc: 4 (age, sex, vascular disease history, T2DM); b.) rate/rhythm maintained on oral metoprolol tartrate; no chronic OAC   Basal cell carcinoma 06/05/2019   R low back, EDC 07/16/19   Basal cell carcinoma 04/15/2010   Right post shoulder   Basal cell carcinoma 04/15/2010   Mid back   Basal cell carcinoma 07/13/2023   left top of shoulder, [facility] 09/26/23   Basal cell carcinoma (BCC) 09/29/2016   Left post shoulder. Superficial and nodular.   BCC (basal cell carcinoma) 07/21/2021   right mid back, [facility] 09/15/2021   BCC (basal cell carcinoma) 01/27/2022   left lower abdomen, [facility] 07/28/22   BCC (basal cell carcinoma) 07/28/2022   right lateral forehead, [facility] 08/10/22   BCC (basal cell carcinoma) 07/28/2022   left buttock, ED[facility] 08/10/22   Bilateral pleural effusion    Bleeding gastric ulcer 2000   Breast cancer, left (HCC) 2005   a.)  s/p LEFT mastectomy 2005 --> lymph nodes were (+) --> Tx'd with systemic antineoplastic chemotherapy   CAD (coronary artery disease)    a.) cCTA 04/05/2022: Ca2+ 23 (51st %'ile for age/sex/race matched control); 25-49% mLAD  calcification   CHB (complete heart block) (HCC)    a.) symptomatic; s/p Medtronic Azure XT DR MRI SureScan PPM placement 09/19/2017   COPD (chronic obstructive pulmonary disease) (HCC)    Diet-controlled type 2 diabetes mellitus (HCC)    Dysphonia    Dyspnea    GERD (gastroesophageal reflux disease)    History of basal cell carcinoma (BCC) 03/22/2021   right neck and right medial ear, Moh's   History of bilateral cataract extraction    History of blood transfusion    Hodgkin's lymphoma (HCC) 1974   a.) s/p laparotomy for splenectomy and liver Bx in 1974 --> Tx'd with systemic chemotherapy + chest XRT --> recurrence 2 years later requiring additional systemic chemotherapy and XRT   Hypothyroidism    Lower leg edema    Mitral valve stenosis    a.) s/p minimally invasive MVR 09/13/2017 --> 25 mm Medtronic Mosaic porcine stented bioprosthetic tissue valve   NSVT (nonsustained ventricular tachycardia) (HCC)    Osteoporosis    lumbar verterbral fracture, left ankle fracture, dexa 2015   Pancreatitis    Pneumonia    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   a.) Medtronic Azure XT DR MRI SureScan [id number] (SN: [id number])   Pulmonary HTN (HCC)    a.) TTE: 05/14/14: RVSP 65; b.) TTE 08/31/15: RVSP 51,  c.) TTE 12/23/16: RVSP 78; d.) RHC 06/27/17: mRA 6, mPA 37, mPCWP 18, AO sat 99, PA sat 67, CO 4.15, CI 2.43, PVR 4.6, AoV peak-peak 12, mitral MPG 11; e.) TTE 11/10/17: RVSP 63, f.) TTE 10/19/18: RVSP 52; g.) TTE 10/09/20: RVSP 42.9; h.) TTE 12/22/21: RVSP 54.7; i.) TTE 05/17/22: 47.7; j.) TTE 05/26/22: RVSP 40.5; k.) TTE 05/30/23: RVSP 37.1   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/07/2022   a.) s/p AVR 04/07/2022 --> 19 mm Inspiris Resilia valve   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 09/13/2017   a.) 25 mm Medtronic Mosaic porcine stented bioprosthetic tissue valve   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   Left medial lower leg   Squamous cell  carcinoma in situ (SCCIS) 07/28/2022   right anterior thigh, EDC 08/10/22   Syncope    Tachycardia    Past Surgical History:  Procedure Laterality Date   AORTIC VALVE REPLACEMENT N/A 04/07/2022   Procedure: AORTIC VALVE REPLACEMENT (AVR), USING INSPIRIS 19 MM AORTIC VALVE;  Surgeon: Alleen Borne, MD;  Location: [facility] OR;  Service: Open Heart Surgery;  Laterality: N/A;   ATRIAL FLUTTER ABLATION N/A 1998   BUBBLE STUDY  01/12/2022   Procedure: BUBBLE STUDY;  Surgeon: Sande Rives, MD;  Location: [facility];  Service: Cardiovascular;;   CARDIAC ELECTROPHYSIOLOGY MAPPING AND ABLATION  1999   CATARACT EXTRACTION, BILATERAL     COLONOSCOPY     IR RADIOLOGY PERIPHERAL GUIDED IV START  08/03/2017   IR THORACENTESIS ASP PLEURAL SPACE W/IMG GUIDE  05/23/2022   IR THORACENTESIS ASP PLEURAL SPACE W/IMG GUIDE  05/24/2022   IR US GUIDE VASC ACCESS RIGHT  08/03/2017   LAPAROTOMY N/A 1974   MASTECTOMY Left 2005   MICROLARYNGOSCOPY Bilateral 08/22/2023   Procedure: SUSPENSION MICROLARYNGOSCOPY WITH INJECTION;  Surgeon: Bud Face, MD;  Location: [facility] ORS;  Service: ENT;  Laterality: Bilateral;   MITRAL VALVE REPAIR Right 09/13/2017   Procedure: MINIMALLY INVASIVE MITRAL VALVE REPLACEMENT (MVR) with Medtronic Mosaic Porcine Heart Valve size 25mm;  Surgeon: Purcell Nails, MD;  Location: [facility] OR;  Service: Open Heart Surgery;  Laterality: Right;   ORIF ANKLE FRACTURE Left 03/21/2014   Procedure: LEFT ANKLE FRACTURE OPEN TREATMENT BILMALLEOLAR INCLUDES INTERNAL FIXATION ;  Surgeon: Sheral Apley, MD;  Location: [facility];  Service: Orthopedics;  Laterality: Left;   PACEMAKER IMPLANT N/A 09/19/2017   Medtronic Azure XT MRI conditional dual-chamber pacemaker for symptomatic complete heart block  by Dr Johney Frame   RIGHT/LEFT HEART CATH AND CORONARY ANGIOGRAPHY N/A 06/27/2017   Procedure: Right/Left Heart Cath and Coronary Angiography;  Surgeon: Laurey Morale, MD;  Location:  [facility] [address] INVASIVE CV LAB;  Service: Cardiovascular;  Laterality: N/A;   SPLENECTOMY  1974   hodgkins disease   TEE WITHOUT CARDIOVERSION  06/12/2012   Procedure: TRANSESOPHAGEAL ECHOCARDIOGRAM (TEE);  Surgeon: Laurey Morale, MD;  Location: [facility] ENDOSCOPY;  Service: Cardiovascular;  Laterality: N/A;   TEE WITHOUT CARDIOVERSION N/A 06/29/2017   Procedure: TRANSESOPHAGEAL ECHOCARDIOGRAM (TEE);  Surgeon: Jake Bathe, MD;  Location: [facility] ENDOSCOPY;  Service: Cardiovascular;  Laterality: N/A;   TEE WITHOUT CARDIOVERSION N/A 09/13/2017   Procedure: TRANSESOPHAGEAL ECHOCARDIOGRAM (TEE);  Surgeon: Purcell Nails, MD;  Location: [facility] OR;  Service: Open Heart Surgery;  Laterality: N/A;   TEE WITHOUT CARDIOVERSION N/A 01/12/2022   Procedure: TRANSESOPHAGEAL ECHOCARDIOGRAM (TEE);  Surgeon: Sande Rives, MD;  Location: [facility] ENDOSCOPY;  Service: Cardiovascular;  Laterality: N/A;   TEE WITHOUT CARDIOVERSION  N/A 04/07/2022   Procedure: TRANSESOPHAGEAL ECHOCARDIOGRAM (TEE);  Surgeon: Alleen Borne, MD;  Location: [facility] OR;  Service: Open Heart Surgery;  Laterality: N/A;   TONSILLECTOMY AND ADENOIDECTOMY     Patient Active Problem List   Diagnosis Date Noted   NSVT (nonsustained ventricular tachycardia) (HCC) 06/29/2023   Paroxysmal atrial fibrillation (HCC) 06/29/2023   Pulmonary valve disease 04/18/2023   Pleural effusion 05/22/2022   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/07/2022   Pacemaker 03/08/2022   CHB (complete heart block) (HCC) 12/27/2021   S/P MVR (mitral valve repair) 10/02/2019   Pneumonia    Hodgkin's lymphoma (HCC)    GERD (gastroesophageal reflux disease)    COPD (chronic obstructive pulmonary disease) (HCC)    Atrial flutter (HCC)    Breast cancer (HCC)    Bleeding gastric ulcer    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 09/13/2017   Pulmonary HTN (HCC) 06/25/2017   Mitral valve stenosis 04/25/2017   Aortic valve regurgitation 04/25/2017   Osteoporosis     Pancreatitis    Prediabetes    Acute respiratory failure with hypoxia (HCC) 01/07/2015   Hodgkin lymphoma (HCC) 01/07/2015   Abdominal pain, epigastric    Diarrhea    AKI (acute kidney injury) (HCC)    Hyponatremia    Acute pancreatitis 12/25/2014   Leukocytosis    Mitral regurgitation 06/07/2012   Syncope 04/26/2012   HTN (hypertension) 04/26/2012   Abnormal stress test 04/26/2012   Hyperkalemia 04/26/2012   Tachycardia 04/26/2012    ONSET DATE:  04/06/2022; date of referral 11/30/2023  REFERRING DIAG: Dysphonia (R49.0); 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 On 08/22/2023 pt underwent bilateral injection of Prolaryn VOICE GEL d/t "more bowing noted on left on laryngoscopy during procedure than appreciate in office. On 11/30/2023 laryngoscopy revealed moderate glottic gap with phonation and mild weakness of right vocal fold. Further ENT not reported pt "continue to have dysphonia with moderate glottic gap. Initially right vocal fold was immobile then twitching at the time of injection with voice gel. Now with pretty good movement. No improvement per patient in dysphonia and choking. Continues to have dysphagia and coughing and swallowing issues. Reviewed MBSS. Will make speech referral."  Of note, pt with MBSS on 04/19/2023 [pt presents with mild oropharyngeal swallow that is without penetration or aspiration of any tested consistency (thin, nectar thick, honey thick, puree, solid, 13mm barium tablet). SLP suspects that patient's sensation of "choking" with foods is related to likely slow transit of heavier, more dense boluses and possibly brief stasis of these boluses within  pharynx.   CT soft tissue neck 04/25/2023 1. Symmetric appearance of the glottis, no detected cord paresis.    PAIN:  Are you having pain? No   FALLS: Has patient fallen in last 6 months? No,  LIVING ENVIRONMENT: Lives with: lives alone Lives in: House/apartment  PLOF: Independent  SUBJECTIVE STATEMENT: Pt pleasant, eager, discussing recent snow Pt accompanied by: self  PATIENT GOALS    to improve vocal quality and swallow ability  OBJECTIVE:   TODAY'S TREATMENT:  Skilled treatment session focused on pt's dysphonia goals. SLP facilitated session by providing the following interventions:  Skilled verbal and written education provided on throat clearing and ways to suppress throat  clear. Pt with increased self-monitoring on throat clearing. "I feel like something is there." Sensation reduced with swallow.   SLP further engaged pt in pulsing /s/ with moderate faded to minimal assistance. Pt able to participate in increased vocal intensity drills utilizing "ah" and counting to improve vocal quality. Min A faded to rare supervision cues.    PATIENT EDUCATION: Education details: throat clearing, see the above Person educated: Patient Education method: Explanation Education comprehension: needs further education   HOME EXERCISE PROGRAM: Abdominal breathing Sustained /s/ using abdominal breathing Pulsing /s/ Suppression of throat clears  GOALS: Goals reviewed with patient? Yes  SHORT TERM GOALS: Target date: 10 sessions  Patient will participate in objective swallowing evaluation (MBSS) to identify safest diet recommendation as well as therapeutic targets.  Baseline: Goal status: INITIAL  2.  With Supervision A, pt will demonstrate understanding of result and recommendations of MBS by verbalizing salient points. Baseline:  Goal status: INITIAL  3.  The patient will demonstrate abdominal breathing patterns and steady release of breath on exhalation to optimize  efficiency of voicing and decrease laryngeal hyperfunction.  Baseline:  Goal status: INITIAL   LONG TERM GOALS: Target date: 02/13/2024  Patient will improve perception of swallowing as indicated by an improvement in EAT-10 score by 12 weeks from initial swallowing therapy session.  Baseline: score = 29 Goal status: INITIAL  2.  The patient will be independent for abdominal breathing and breath support exercises.  Baseline:  Goal status: INITIAL  3.  Patient will report improved communication effectiveness as measured by PROM Baseline:  Goal status: INITIAL  4.  The patient will participate in 15-20 minutes conversation, maintaining average loudness of 75 dB and loud, good quality voice.  Baseline:  Goal status: INITIAL  ASSESSMENT:  CLINICAL IMPRESSION: Patient is a 73 y.o. female who was seen today for a behavioral voice treatment session. She continues to present with mild dysphonia that is c/b decreased respiratory support and clavicular breathing, strained, raspy and hoarse vocal quality.   Pt eager with ability to improve vocal quality. See the above treatment note for details.   OBJECTIVE IMPAIRMENTS include voice disorder and dysphagia. These impairments are limiting patient from effectively communicating at home and in community and safety when swallowing. Factors affecting potential to achieve goals and functional outcome are medical prognosis, severity of impairments, and time post onset and surgical intervention . Patient will benefit from skilled SLP services to address above impairments and improve overall function.  REHAB POTENTIAL: Good  PLAN: SLP FREQUENCY: 1-2x/week  SLP DURATION: 8 weeks  PLANNED INTERVENTIONS: Aspiration precaution training, Diet toleration management , SLP instruction and feedback, Compensatory strategies, Patient/family education, [phone] Treatment of speech (30 or 45 min) , and [id number] Treatment of swallowing function  Caliyah Sieh B. Dreama Saa,  M.S., CCC-SLP, Tree surgeon Certified Brain Injury Specialist [facility]  [facility] Office [phone] Ascom [phone] Fax [phone]

## 2024-01-03 ENCOUNTER — Ambulatory Visit: Payer: Medicare PPO | Admitting: Speech Pathology

## 2024-01-05 ENCOUNTER — Ambulatory Visit: Payer: Medicare PPO | Admitting: Speech Pathology

## 2024-01-05 DIAGNOSIS — R1310 Dysphagia, unspecified: Secondary | ICD-10-CM | POA: Diagnosis not present

## 2024-01-05 DIAGNOSIS — R490 Dysphonia: Secondary | ICD-10-CM

## 2024-01-05 DIAGNOSIS — T17908A Unspecified foreign body in respiratory tract, part unspecified causing other injury, initial encounter: Secondary | ICD-10-CM | POA: Diagnosis not present

## 2024-01-05 NOTE — Therapy (Signed)
OUTPATIENT SPEECH LANGUAGE PATHOLOGY  VOICE TREATMENT NOTE   Patient Name: Patricia [name] MRN: [id number] DOB:July 10, 1951, 73 y.o., female Today's Date: 01/05/2024  PCP: Lynnea Ferrier, MD REFERRING PROVIDER: Bud Face, MD   End of Session - 01/05/24 [phone]     Visit Number 5    Number of Visits 17    Date for SLP Re-Evaluation 02/13/24    Authorization Type [employer] PPO    Progress Note Due on Visit 10    SLP Start Time 0845    SLP Stop Time  0930    SLP Time Calculation (min) 45 min    Activity Tolerance Patient tolerated treatment well             Past Medical History:  Diagnosis Date   Anemia    Aortic atherosclerosis (HCC)    Aortic insufficiency    a.) s/p AVR 04/07/2022 --> 19 mm Inspiris Resilia valve   Atrial fibrillation and flutter (HCC)    a.) s/p ablation 1998; b.) CHA2DS2-VASc: 4 (age, sex, vascular disease history, T2DM); b.) rate/rhythm maintained on oral metoprolol tartrate; no chronic OAC   Basal cell carcinoma 06/05/2019   R low back, EDC 07/16/19   Basal cell carcinoma 04/15/2010   Right post shoulder   Basal cell carcinoma 04/15/2010   Mid back   Basal cell carcinoma 07/13/2023   left top of shoulder, EDC 09/26/23   Basal cell carcinoma (BCC) 09/29/2016   Left post shoulder. Superficial and nodular.   BCC (basal cell carcinoma) 07/21/2021   right mid back, [facility] 09/15/2021   BCC (basal cell carcinoma) 01/27/2022   left lower abdomen, EDC 07/28/22   BCC (basal cell carcinoma) 07/28/2022   right lateral forehead, EDC 08/10/22   BCC (basal cell carcinoma) 07/28/2022   left buttock, EDC 08/10/22   Bilateral pleural effusion    Bleeding gastric ulcer 2000   Breast cancer, left (HCC) 2005   a.)  s/p LEFT mastectomy 2005 --> lymph nodes were (+) --> Tx'd with systemic antineoplastic chemotherapy   CAD (coronary artery disease)    a.) cCTA 04/05/2022: Ca2+ 23 (51st %'ile for age/sex/race matched control); 25-49% mLAD  calcification   CHB (complete heart block) (HCC)    a.) symptomatic; s/p Medtronic Azure XT DR MRI SureScan PPM placement 09/19/2017   COPD (chronic obstructive pulmonary disease) (HCC)    Diet-controlled type 2 diabetes mellitus (HCC)    Dysphonia    Dyspnea    GERD (gastroesophageal reflux disease)    History of basal cell carcinoma (BCC) 03/22/2021   right neck and right medial ear, Moh's   History of bilateral cataract extraction    History of blood transfusion    Hodgkin's lymphoma (HCC) 1974   a.) s/p laparotomy for splenectomy and liver Bx in 1974 --> Tx'd with systemic chemotherapy + chest XRT --> recurrence 2 years later requiring additional systemic chemotherapy and XRT   Hypothyroidism    Lower leg edema    Mitral valve stenosis    a.) s/p minimally invasive MVR 09/13/2017 --> 25 mm Medtronic Mosaic porcine stented bioprosthetic tissue valve   NSVT (nonsustained ventricular tachycardia) (HCC)    Osteoporosis    lumbar verterbral fracture, left ankle fracture, dexa 2015   Pancreatitis    Pneumonia    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   a.) Medtronic Azure XT DR MRI SureScan [id number] (SN: [id number])   Pulmonary HTN (HCC)    a.) TTE: 05/14/14: RVSP 65; b.) TTE 08/31/15: RVSP 51,  c.) TTE 12/23/16: RVSP 78; d.) RHC 06/27/17: mRA 6, mPA 37, mPCWP 18, AO sat 99, PA sat 67, CO 4.15, CI 2.43, PVR 4.6, AoV peak-peak 12, mitral MPG 11; e.) TTE 11/10/17: RVSP 63, f.) TTE 10/19/18: RVSP 52; g.) TTE 10/09/20: RVSP 42.9; h.) TTE 12/22/21: RVSP 54.7; i.) TTE 05/17/22: 47.7; j.) TTE 05/26/22: RVSP 40.5; k.) TTE 05/30/23: RVSP 37.1   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/07/2022   a.) s/p AVR 04/07/2022 --> 19 mm Inspiris Resilia valve   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 09/13/2017   a.) 25 mm Medtronic Mosaic porcine stented bioprosthetic tissue valve   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   Left medial lower leg   Squamous cell  carcinoma in situ (SCCIS) 07/28/2022   right anterior thigh, EDC 08/10/22   Syncope    Tachycardia    Past Surgical History:  Procedure Laterality Date   AORTIC VALVE REPLACEMENT N/A 04/07/2022   Procedure: AORTIC VALVE REPLACEMENT (AVR), USING INSPIRIS 19 MM AORTIC VALVE;  Surgeon: Alleen Borne, MD;  Location: [facility] OR;  Service: Open Heart Surgery;  Laterality: N/A;   ATRIAL FLUTTER ABLATION N/A 1998   BUBBLE STUDY  01/12/2022   Procedure: BUBBLE STUDY;  Surgeon: Sande Rives, MD;  Location: [facility] ENDOSCOPY;  Service: Cardiovascular;;   CARDIAC ELECTROPHYSIOLOGY MAPPING AND ABLATION  1999   CATARACT EXTRACTION, BILATERAL     COLONOSCOPY     IR RADIOLOGY PERIPHERAL GUIDED IV START  08/03/2017   IR THORACENTESIS ASP PLEURAL SPACE W/IMG GUIDE  05/23/2022   IR THORACENTESIS ASP PLEURAL SPACE W/IMG GUIDE  05/24/2022   IR US GUIDE VASC ACCESS RIGHT  08/03/2017   LAPAROTOMY N/A 1974   MASTECTOMY Left 2005   MICROLARYNGOSCOPY Bilateral 08/22/2023   Procedure: SUSPENSION MICROLARYNGOSCOPY WITH INJECTION;  Surgeon: Bud Face, MD;  Location: [facility] ORS;  Service: ENT;  Laterality: Bilateral;   MITRAL VALVE REPAIR Right 09/13/2017   Procedure: MINIMALLY INVASIVE MITRAL VALVE REPLACEMENT (MVR) with Medtronic Mosaic Porcine Heart Valve size 25mm;  Surgeon: Purcell Nails, MD;  Location: [facility] OR;  Service: Open Heart Surgery;  Laterality: Right;   ORIF ANKLE FRACTURE Left 03/21/2014   Procedure: LEFT ANKLE FRACTURE OPEN TREATMENT BILMALLEOLAR INCLUDES INTERNAL FIXATION ;  Surgeon: Sheral Apley, MD;  Location: [facility];  Service: Orthopedics;  Laterality: Left;   PACEMAKER IMPLANT N/A 09/19/2017   Medtronic Azure XT MRI conditional dual-chamber pacemaker for symptomatic complete heart block  by Dr Johney Frame   RIGHT/LEFT HEART CATH AND CORONARY ANGIOGRAPHY N/A 06/27/2017   Procedure: Right/Left Heart Cath and Coronary Angiography;  Surgeon: Laurey Morale, MD;  Location:  [facility] INVASIVE CV LAB;  Service: Cardiovascular;  Laterality: N/A;   SPLENECTOMY  1974   hodgkins disease   TEE WITHOUT CARDIOVERSION  06/12/2012   Procedure: TRANSESOPHAGEAL ECHOCARDIOGRAM (TEE);  Surgeon: Laurey Morale, MD;  Location: [facility] ENDOSCOPY;  Service: Cardiovascular;  Laterality: N/A;   TEE WITHOUT CARDIOVERSION N/A 06/29/2017   Procedure: TRANSESOPHAGEAL ECHOCARDIOGRAM (TEE);  Surgeon: Jake Bathe, MD;  Location: [facility] ENDOSCOPY;  Service: Cardiovascular;  Laterality: N/A;   TEE WITHOUT CARDIOVERSION N/A 09/13/2017   Procedure: TRANSESOPHAGEAL ECHOCARDIOGRAM (TEE);  Surgeon: Purcell Nails, MD;  Location: [facility] OR;  Service: Open Heart Surgery;  Laterality: N/A;   TEE WITHOUT CARDIOVERSION N/A 01/12/2022   Procedure: TRANSESOPHAGEAL ECHOCARDIOGRAM (TEE);  Surgeon: Sande Rives, MD;  Location: [facility] ENDOSCOPY;  Service: Cardiovascular;  Laterality: N/A;   TEE WITHOUT CARDIOVERSION  N/A 04/07/2022   Procedure: TRANSESOPHAGEAL ECHOCARDIOGRAM (TEE);  Surgeon: Alleen Borne, MD;  Location: [facility] OR;  Service: Open Heart Surgery;  Laterality: N/A;   TONSILLECTOMY AND ADENOIDECTOMY     Patient Active Problem List   Diagnosis Date Noted   NSVT (nonsustained ventricular tachycardia) (HCC) 06/29/2023   Paroxysmal atrial fibrillation (HCC) 06/29/2023   Pulmonary valve disease 04/18/2023   Pleural effusion 05/22/2022   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/07/2022   Pacemaker 03/08/2022   CHB (complete heart block) (HCC) 12/27/2021   S/P MVR (mitral valve repair) 10/02/2019   Pneumonia    Hodgkin's lymphoma (HCC)    GERD (gastroesophageal reflux disease)    COPD (chronic obstructive pulmonary disease) (HCC)    Atrial flutter (HCC)    Breast cancer (HCC)    Bleeding gastric ulcer    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 09/13/2017   Pulmonary HTN (HCC) 06/25/2017   Mitral valve stenosis 04/25/2017   Aortic valve regurgitation 04/25/2017   Osteoporosis     Pancreatitis    Prediabetes    Acute respiratory failure with hypoxia (HCC) 01/07/2015   Hodgkin lymphoma (HCC) 01/07/2015   Abdominal pain, epigastric    Diarrhea    AKI (acute kidney injury) (HCC)    Hyponatremia    Acute pancreatitis 12/25/2014   Leukocytosis    Mitral regurgitation 06/07/2012   Syncope 04/26/2012   HTN (hypertension) 04/26/2012   Abnormal stress test 04/26/2012   Hyperkalemia 04/26/2012   Tachycardia 04/26/2012    ONSET DATE:  04/06/2022; date of referral 11/30/2023  REFERRING DIAG: Dysphonia (R49.0); 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 On 08/22/2023 pt underwent bilateral injection of Prolaryn VOICE GEL d/t "more bowing noted on left on laryngoscopy during procedure than appreciate in office. On 11/30/2023 laryngoscopy revealed moderate glottic gap with phonation and mild weakness of right vocal fold. Further ENT not reported pt "continue to have dysphonia with moderate glottic gap. Initially right vocal fold was immobile then twitching at the time of injection with voice gel. Now with pretty good movement. No improvement per patient in dysphonia and choking. Continues to have dysphagia and coughing and swallowing issues. Reviewed MBSS. Will make speech referral."  Of note, pt with MBSS on 04/19/2023 [pt presents with mild oropharyngeal swallow that is without penetration or aspiration of any tested consistency (thin, nectar thick, honey thick, puree, solid, 13mm barium tablet). SLP suspects that patient's sensation of "choking" with foods is related to likely slow transit of heavier, more dense boluses and possibly brief stasis of these boluses within  pharynx.   CT soft tissue neck 04/25/2023 1. Symmetric appearance of the glottis, no detected cord paresis.    PAIN:  Are you having pain? No   FALLS: Has patient fallen in last 6 months? No,  LIVING ENVIRONMENT: Lives with: lives alone Lives in: House/apartment  PLOF: Independent  SUBJECTIVE STATEMENT: Pt pleasant, eager, conversant, completed HEP, reports increased hoarseness this morning, sipping on water helpful throughout session Pt accompanied by: self  PATIENT GOALS    to improve vocal quality and swallow ability  OBJECTIVE:   TODAY'S TREATMENT:  Skilled treatment session focused on pt's dysphonia goals. SLP facilitated session by providing the following interventions:  Patient instructed in  flow phonation through skill level 1: establish airflow release: Unarticulated: significant improvement in duration of airflow and stability of airflow - rare Min A to supervision cues.  Articulated (unvoiced): patient demonstrates improving mastery of maintaining airflow while moving articulators.  Rare Min A;  SKILL LEVEL 2:  Airflow + Voicing - Unarticulated:  best success with moderate to maximal cues for increasing breath support, ultimately discontinued d/t pitch breaks  You have been issued a Respiratory Muscle Strength Trainer(s) to increase lung strength for improved speech and/or swallowing abilities. Please follow the instructions below provided by your Speech-Language Pathologist to complete your exercises. PT able to achieve a self-rate effort level of 7 out of 10 when completing 3 sets of 8 with EMST set at 15cmH2O.     PATIENT EDUCATION: Education details: throat clearing, see the above Person educated: Patient Education method: Explanation Education comprehension: needs further education   HOME EXERCISE PROGRAM: Abdominal breathing Sustained /s/ using abdominal breathing Pulsing /s/ Suppression of throat clears  GOALS: Goals reviewed with patient?  Yes  SHORT TERM GOALS: Target date: 10 sessions  Patient will participate in objective swallowing evaluation (MBSS) to identify safest diet recommendation as well as therapeutic targets.  Baseline: Goal status: INITIAL  2.  With Supervision A, pt will demonstrate understanding of result and recommendations of MBS by verbalizing salient points. Baseline:  Goal status: INITIAL  3.  The patient will demonstrate abdominal breathing patterns and steady release of breath on exhalation to optimize efficiency of voicing and decrease laryngeal hyperfunction.  Baseline:  Goal status: INITIAL   LONG TERM GOALS: Target date: 02/13/2024  Patient will improve perception of swallowing as indicated by an improvement in EAT-10 score by 12 weeks from initial swallowing therapy session.  Baseline: score = 29 Goal status: INITIAL  2.  The patient will be independent for abdominal breathing and breath support exercises.  Baseline:  Goal status: INITIAL  3.  Patient will report improved communication effectiveness as measured by PROM Baseline:  Goal status: INITIAL  4.  The patient will participate in 15-20 minutes conversation, maintaining average loudness of 75 dB and loud, good quality voice.  Baseline:  Goal status: INITIAL  ASSESSMENT:  CLINICAL IMPRESSION: Patient is a 73 y.o. female who was seen today for a behavioral voice treatment session. She continues to present with mild dysphonia that is c/b decreased respiratory support and clavicular breathing, strained, raspy and hoarse vocal quality.   Pt eager with ability to improve vocal quality. See the above treatment note for details.   OBJECTIVE IMPAIRMENTS include voice disorder and dysphagia. These impairments are limiting patient from effectively communicating at home and in community and safety when swallowing. Factors affecting potential to achieve goals and functional outcome are medical prognosis, severity of impairments, and time  post onset and surgical intervention . Patient will benefit from skilled SLP services to address above impairments and improve overall function.  REHAB POTENTIAL: Good  PLAN: SLP FREQUENCY: 1-2x/week  SLP DURATION: 8 weeks  PLANNED INTERVENTIONS: Aspiration precaution training, Diet toleration management , SLP instruction and feedback, Compensatory strategies, Patient/family education, [phone] Treatment of speech (30 or 45 min) , and [id number] Treatment of swallowing function  Shilo Philipson B. Dreama Saa, M.S., CCC-SLP, Tree surgeon Certified Brain Injury Specialist [facility]  [facility] Services Office [phone] Ascom [phone] Fax [phone]

## 2024-01-09 ENCOUNTER — Ambulatory Visit: Payer: Medicare PPO | Admitting: Speech Pathology

## 2024-01-09 DIAGNOSIS — R490 Dysphonia: Secondary | ICD-10-CM

## 2024-01-09 DIAGNOSIS — T17908A Unspecified foreign body in respiratory tract, part unspecified causing other injury, initial encounter: Secondary | ICD-10-CM | POA: Diagnosis not present

## 2024-01-09 DIAGNOSIS — R1310 Dysphagia, unspecified: Secondary | ICD-10-CM | POA: Diagnosis not present

## 2024-01-09 NOTE — Therapy (Signed)
OUTPATIENT SPEECH LANGUAGE PATHOLOGY  VOICE TREATMENT NOTE   Patient Name: Patricia [name] MRN: [id number] DOB:1951-11-04, 73 y.o., female Today's Date: 01/09/2024  PCP: Lynnea Ferrier, MD REFERRING PROVIDER: Bud Face, MD   End of Session - 01/09/24 1356     Visit Number 6    Number of Visits 17    Date for SLP Re-Evaluation 02/13/24    Authorization Type [employer] Choice PPO    Progress Note Due on Visit 10    SLP Start Time 1400    SLP Stop Time  1445    SLP Time Calculation (min) 45 min    Activity Tolerance Patient tolerated treatment well             Past Medical History:  Diagnosis Date   Anemia    Aortic atherosclerosis (HCC)    Aortic insufficiency    a.) s/p AVR 04/07/2022 --> 19 mm Inspiris Resilia valve   Atrial fibrillation and flutter (HCC)    a.) s/p ablation 1998; b.) CHA2DS2-VASc: 4 (age, sex, vascular disease history, T2DM); b.) rate/rhythm maintained on oral metoprolol tartrate; no chronic OAC   Basal cell carcinoma 06/05/2019   R low back, ED[facility] 07/16/19   Basal cell carcinoma 04/15/2010   Right post shoulder   Basal cell carcinoma 04/15/2010   Mid back   Basal cell carcinoma 07/13/2023   left top of shoulder, [facility] 09/26/23   Basal cell carcinoma (BCC) 09/29/2016   Left post shoulder. Superficial and nodular.   BCC (basal cell carcinoma) 07/21/2021   right mid back, [facility] 09/15/2021   BCC (basal cell carcinoma) 01/27/2022   left lower abdomen, [facility] 07/28/22   BCC (basal cell carcinoma) 07/28/2022   right lateral forehead, [facility] 08/10/22   BCC (basal cell carcinoma) 07/28/2022   left buttock, ED[facility] 08/10/22   Bilateral pleural effusion    Bleeding gastric ulcer 2000   Breast cancer, left (HCC) 2005   a.)  s/p LEFT mastectomy 2005 --> lymph nodes were (+) --> Tx'd with systemic antineoplastic chemotherapy   CAD (coronary artery disease)    a.) cCTA 04/05/2022: Ca2+ 23 (51st %'ile for age/sex/race matched control); 25-49% mLAD  calcification   CHB (complete heart block) (HCC)    a.) symptomatic; s/p Medtronic Azure XT DR MRI SureScan PPM placement 09/19/2017   COPD (chronic obstructive pulmonary disease) (HCC)    Diet-controlled type 2 diabetes mellitus (HCC)    Dysphonia    Dyspnea    GERD (gastroesophageal reflux disease)    History of basal cell carcinoma (BCC) 03/22/2021   right neck and right medial ear, Moh's   History of bilateral cataract extraction    History of blood transfusion    Hodgkin's lymphoma (HCC) 1974   a.) s/p laparotomy for splenectomy and liver Bx in 1974 --> Tx'd with systemic chemotherapy + chest XRT --> recurrence 2 years later requiring additional systemic chemotherapy and XRT   Hypothyroidism    Lower leg edema    Mitral valve stenosis    a.) s/p minimally invasive MVR 09/13/2017 --> 25 mm Medtronic Mosaic porcine stented bioprosthetic tissue valve   NSVT (nonsustained ventricular tachycardia) (HCC)    Osteoporosis    lumbar verterbral fracture, left ankle fracture, dexa 2015   Pancreatitis    Pneumonia    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   a.) Medtronic Azure XT DR MRI SureScan [id number] (SN: [id number])   Pulmonary HTN (HCC)    a.) TTE: 05/14/14: RVSP 65; b.) TTE 08/31/15: RVSP 51,  c.) TTE 12/23/16: RVSP 78; d.) RHC 06/27/17: mRA 6, mPA 37, mPCWP 18, AO sat 99, PA sat 67, CO 4.15, CI 2.43, PVR 4.6, AoV peak-peak 12, mitral MPG 11; e.) TTE 11/10/17: RVSP 63, f.) TTE 10/19/18: RVSP 52; g.) TTE 10/09/20: RVSP 42.9; h.) TTE 12/22/21: RVSP 54.7; i.) TTE 05/17/22: 47.7; j.) TTE 05/26/22: RVSP 40.5; k.) TTE 05/30/23: RVSP 37.1   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/07/2022   a.) s/p AVR 04/07/2022 --> 19 mm Inspiris Resilia valve   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 09/13/2017   a.) 25 mm Medtronic Mosaic porcine stented bioprosthetic tissue valve   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   Left medial lower leg   Squamous cell  carcinoma in situ (SCCIS) 07/28/2022   right anterior thigh, EDC 08/10/22   Syncope    Tachycardia    Past Surgical History:  Procedure Laterality Date   AORTIC VALVE REPLACEMENT N/A 04/07/2022   Procedure: AORTIC VALVE REPLACEMENT (AVR), USING INSPIRIS 19 MM AORTIC VALVE;  Surgeon: Alleen Borne, MD;  Location: [facility] OR;  Service: Open Heart Surgery;  Laterality: N/A;   ATRIAL FLUTTER ABLATION N/A 1998   BUBBLE STUDY  01/12/2022   Procedure: BUBBLE STUDY;  Surgeon: Sande Rives, MD;  Location: [facility] ENDOSCOPY;  Service: Cardiovascular;;   CARDIAC ELECTROPHYSIOLOGY MAPPING AND ABLATION  1999   CATARACT EXTRACTION, BILATERAL     COLONOSCOPY     IR RADIOLOGY PERIPHERAL GUIDED IV START  08/03/2017   IR THORACENTESIS ASP PLEURAL SPACE W/IMG GUIDE  05/23/2022   IR THORACENTESIS ASP PLEURAL SPACE W/IMG GUIDE  05/24/2022   IR US GUIDE VASC ACCESS RIGHT  08/03/2017   LAPAROTOMY N/A 1974   MASTECTOMY Left 2005   MICROLARYNGOSCOPY Bilateral 08/22/2023   Procedure: SUSPENSION MICROLARYNGOSCOPY WITH INJECTION;  Surgeon: Bud Face, MD;  Location: [facility] ORS;  Service: ENT;  Laterality: Bilateral;   MITRAL VALVE REPAIR Right 09/13/2017   Procedure: MINIMALLY INVASIVE MITRAL VALVE REPLACEMENT (MVR) with Medtronic Mosaic Porcine Heart Valve size 25mm;  Surgeon: Purcell Nails, MD;  Location: [facility] OR;  Service: Open Heart Surgery;  Laterality: Right;   ORIF ANKLE FRACTURE Left 03/21/2014   Procedure: LEFT ANKLE FRACTURE OPEN TREATMENT BILMALLEOLAR INCLUDES INTERNAL FIXATION ;  Surgeon: Sheral Apley, MD;  Location: [facility];  Service: Orthopedics;  Laterality: Left;   PACEMAKER IMPLANT N/A 09/19/2017   Medtronic Azure XT MRI conditional dual-chamber pacemaker for symptomatic complete heart block  by Dr Johney Frame   RIGHT/LEFT HEART CATH AND CORONARY ANGIOGRAPHY N/A 06/27/2017   Procedure: Right/Left Heart Cath and Coronary Angiography;  Surgeon: Laurey Morale, MD;  Location:  [facility] INVASIVE CV LAB;  Service: Cardiovascular;  Laterality: N/A;   SPLENECTOMY  1974   hodgkins disease   TEE WITHOUT CARDIOVERSION  06/12/2012   Procedure: TRANSESOPHAGEAL ECHOCARDIOGRAM (TEE);  Surgeon: Laurey Morale, MD;  Location: [facility] ENDOSCOPY;  Service: Cardiovascular;  Laterality: N/A;   TEE WITHOUT CARDIOVERSION N/A 06/29/2017   Procedure: TRANSESOPHAGEAL ECHOCARDIOGRAM (TEE);  Surgeon: Jake Bathe, MD;  Location: [facility] ENDOSCOPY;  Service: Cardiovascular;  Laterality: N/A;   TEE WITHOUT CARDIOVERSION N/A 09/13/2017   Procedure: TRANSESOPHAGEAL ECHOCARDIOGRAM (TEE);  Surgeon: Purcell Nails, MD;  Location: [facility] OR;  Service: Open Heart Surgery;  Laterality: N/A;   TEE WITHOUT CARDIOVERSION N/A 01/12/2022   Procedure: TRANSESOPHAGEAL ECHOCARDIOGRAM (TEE);  Surgeon: Sande Rives, MD;  Location: [facility] ENDOSCOPY;  Service: Cardiovascular;  Laterality: N/A;   TEE WITHOUT CARDIOVERSION  N/A 04/07/2022   Procedure: TRANSESOPHAGEAL ECHOCARDIOGRAM (TEE);  Surgeon: Alleen Borne, MD;  Location: [facility] OR;  Service: Open Heart Surgery;  Laterality: N/A;   TONSILLECTOMY AND ADENOIDECTOMY     Patient Active Problem List   Diagnosis Date Noted   NSVT (nonsustained ventricular tachycardia) (HCC) 06/29/2023   Paroxysmal atrial fibrillation (HCC) 06/29/2023   Pulmonary valve disease 04/18/2023   Pleural effusion 05/22/2022   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/07/2022   Pacemaker 03/08/2022   CHB (complete heart block) (HCC) 12/27/2021   S/P MVR (mitral valve repair) 10/02/2019   Pneumonia    Hodgkin's lymphoma (HCC)    GERD (gastroesophageal reflux disease)    COPD (chronic obstructive pulmonary disease) (HCC)    Atrial flutter (HCC)    Breast cancer (HCC)    Bleeding gastric ulcer    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 09/13/2017   Pulmonary HTN (HCC) 06/25/2017   Mitral valve stenosis 04/25/2017   Aortic valve regurgitation 04/25/2017   Osteoporosis     Pancreatitis    Prediabetes    Acute respiratory failure with hypoxia (HCC) 01/07/2015   Hodgkin lymphoma (HCC) 01/07/2015   Abdominal pain, epigastric    Diarrhea    AKI (acute kidney injury) (HCC)    Hyponatremia    Acute pancreatitis 12/25/2014   Leukocytosis    Mitral regurgitation 06/07/2012   Syncope 04/26/2012   HTN (hypertension) 04/26/2012   Abnormal stress test 04/26/2012   Hyperkalemia 04/26/2012   Tachycardia 04/26/2012    ONSET DATE:  04/06/2022; date of referral 11/30/2023  REFERRING DIAG: Dysphonia (R49.0); 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 On 08/22/2023 pt underwent bilateral injection of Prolaryn VOICE GEL d/t "more bowing noted on left on laryngoscopy during procedure than appreciate in office. On 11/30/2023 laryngoscopy revealed moderate glottic gap with phonation and mild weakness of right vocal fold. Further ENT not reported pt "continue to have dysphonia with moderate glottic gap. Initially right vocal fold was immobile then twitching at the time of injection with voice gel. Now with pretty good movement. No improvement per patient in dysphonia and choking. Continues to have dysphagia and coughing and swallowing issues. Reviewed MBSS. Will make speech referral."  Of note, pt with MBSS on 04/19/2023 [pt presents with mild oropharyngeal swallow that is without penetration or aspiration of any tested consistency (thin, nectar thick, honey thick, puree, solid, 13mm barium tablet). SLP suspects that patient's sensation of "choking" with foods is related to likely slow transit of heavier, more dense boluses and possibly brief stasis of these boluses within  pharynx.   CT soft tissue neck 04/25/2023 1. Symmetric appearance of the glottis, no detected cord paresis.    PAIN:  Are you having pain? No   FALLS: Has patient fallen in last 6 months? No,  LIVING ENVIRONMENT: Lives with: lives alone Lives in: House/apartment  PLOF: Independent  SUBJECTIVE STATEMENT: Pt brought in water, sipped on throughout session Pt accompanied by: self  PATIENT GOALS    to improve vocal quality and swallow ability  OBJECTIVE:   TODAY'S TREATMENT:  Skilled treatment session focused on pt's dysphonia goals. SLP facilitated session by providing the following interventions:  Patient instructed in flow phonation through skill level 1: establish airflow release:  Unarticulated: significant improvement in duration of airflow and stability of airflow - independent.  Articulated (unvoiced): patient demonstrates improving mastery of maintaining airflow while moving articulators.  independent;  SKILL LEVEL 2:  Airflow + Voicing - Unarticulated:  best success occasional Min A:  Airflow + Voicing - Articulated: having best response to imitating therapist with meaningful phrases.:   You have been issued a Respiratory Muscle Strength Trainer(s) to increase lung strength for improved speech and/or swallowing abilities. Please follow the instructions below provided by your Speech-Language Pathologist to complete your exercises. PT able to achieve a self-rate effort level of 5 out of 10 when completing 3 sets of 8 with EMST set at 15cmH2O.   Pt sipped on water throughout session with no overt s/s of aspiration for ~ 6 oz.    PATIENT EDUCATION: Education details: throat clearing, see the above Person educated: Patient Education method: Explanation Education comprehension: needs further education   HOME EXERCISE PROGRAM: Abdominal breathing Sustained /s/ using abdominal breathing Pulsing /s/ Suppression of throat clears  GOALS: Goals reviewed with patient?  Yes  SHORT TERM GOALS: Target date: 10 sessions  Patient will participate in objective swallowing evaluation (MBSS) to identify safest diet recommendation as well as therapeutic targets.  Baseline: Goal status: INITIAL  2.  With Supervision A, pt will demonstrate understanding of result and recommendations of MBS by verbalizing salient points. Baseline:  Goal status: INITIAL  3.  The patient will demonstrate abdominal breathing patterns and steady release of breath on exhalation to optimize efficiency of voicing and decrease laryngeal hyperfunction.  Baseline:  Goal status: INITIAL   LONG TERM GOALS: Target date: 02/13/2024  Patient will improve perception of swallowing as indicated by an improvement in EAT-10 score by 12 weeks from initial swallowing therapy session.  Baseline: score = 29 Goal status: INITIAL  2.  The patient will be independent for abdominal breathing and breath support exercises.  Baseline:  Goal status: INITIAL  3.  Patient will report improved communication effectiveness as measured by PROM Baseline:  Goal status: INITIAL  4.  The patient will participate in 15-20 minutes conversation, maintaining average loudness of 75 dB and loud, good quality voice.  Baseline:  Goal status: INITIAL  ASSESSMENT:  CLINICAL IMPRESSION: Patient is a 73 y.o. female who was seen today for a behavioral voice treatment session. She continues to present with mild dysphonia that is c/b decreased respiratory support and clavicular breathing, strained, raspy and hoarse vocal quality.   Pt eager with ability to improve vocal quality. See the above treatment note for details.   OBJECTIVE IMPAIRMENTS include voice disorder and dysphagia. These impairments are limiting patient from effectively communicating at home and in community and safety when swallowing. Factors affecting potential to achieve goals and functional outcome are medical prognosis, severity of impairments, and time  post onset and surgical intervention . Patient will benefit from skilled SLP services to address above impairments and improve overall function.  REHAB POTENTIAL: Good  PLAN: SLP FREQUENCY: 1-2x/week  SLP DURATION: 8 weeks  PLANNED INTERVENTIONS: Aspiration precaution training, Diet toleration management , SLP instruction and feedback, Compensatory strategies, Patient/family education, [phone] Treatment of speech (30 or 45 min) , and [id number] Treatment of swallowing function  Quintel Mccalla B. Dreama Saa, M.S., CCC-SLP, Tree surgeon Certified Brain Injury Specialist [facility]  [facility] Rehabilitation Services Office [phone] Ascom [phone] Fax [phone]

## 2024-01-12 ENCOUNTER — Ambulatory Visit: Payer: Medicare PPO | Admitting: Speech Pathology

## 2024-01-17 ENCOUNTER — Ambulatory Visit: Payer: Medicare PPO | Attending: Otolaryngology | Admitting: Speech Pathology

## 2024-01-17 DIAGNOSIS — R490 Dysphonia: Secondary | ICD-10-CM | POA: Insufficient documentation

## 2024-01-17 NOTE — Therapy (Signed)
 OUTPATIENT SPEECH LANGUAGE PATHOLOGY  VOICE TREATMENT NOTE   Patient Name: Patricia [name] MRN: [id number] DOB:09/21/1951, 73 y.o., female Today's Date: 01/17/2024  PCP: Butler Burr, MD REFERRING PROVIDER: Carolee Hunter, MD   End of Session - 01/17/24 0849     Visit Number 7    Number of Visits 17    Date for SLP Re-Evaluation 02/13/24    Authorization Type [employer] PPO    Authorization Time Period 12/19/2023 thru 02/16/2024    Authorization - Visit Number 7    Authorization - Number of Visits 24    Progress Note Due on Visit 10    SLP Start Time 0845    SLP Stop Time  0930    SLP Time Calculation (min) 45 min    Activity Tolerance Patient tolerated treatment well             Past Medical History:  Diagnosis Date   Anemia    Aortic atherosclerosis (HCC)    Aortic insufficiency    a.) s/p AVR 04/07/2022 --> 19 mm Inspiris Resilia valve   Atrial fibrillation and flutter (HCC)    a.) s/p ablation 1998; b.) CHA2DS2-VASc: 4 (age, sex, vascular disease history, T2DM); b.) rate/rhythm maintained on oral metoprolol  tartrate; no chronic OAC   Basal cell carcinoma 06/05/2019   R low back, [facility] 07/16/19   Basal cell carcinoma 04/15/2010   Right post shoulder   Basal cell carcinoma 04/15/2010   Mid back   Basal cell carcinoma 07/13/2023   left top of shoulder, [facility] 09/26/23   Basal cell carcinoma (BCC) 09/29/2016   Left post shoulder. Superficial and nodular.   BCC (basal cell carcinoma) 07/21/2021   right mid back, [facility] 09/15/2021   BCC (basal cell carcinoma) 01/27/2022   left lower abdomen, [facility] 07/28/22   BCC (basal cell carcinoma) 07/28/2022   right lateral forehead, [facility] 08/10/22   BCC (basal cell carcinoma) 07/28/2022   left buttock, ED[facility] 08/10/22   Bilateral pleural effusion    Bleeding gastric ulcer 2000   Breast cancer, left (HCC) 2005   a.)  s/p LEFT mastectomy 2005 --> lymph nodes were (+) --> Tx'd with systemic antineoplastic chemotherapy   CAD  (coronary artery disease)    a.) cCTA 04/05/2022: Ca2+ 23 (51st %'ile for age/sex/race matched control); 25-49% mLAD calcification   CHB (complete heart block) (HCC)    a.) symptomatic; s/p Medtronic Azure XT DR MRI SureScan PPM placement 09/19/2017   COPD (chronic obstructive pulmonary disease) (HCC)    Diet-controlled type 2 diabetes mellitus (HCC)    Dysphonia    Dyspnea    GERD (gastroesophageal reflux disease)    History of basal cell carcinoma (BCC) 03/22/2021   right neck and right medial ear, Moh's   History of bilateral cataract extraction    History of blood transfusion    Hodgkin's lymphoma (HCC) 1974   a.) s/p laparotomy for splenectomy and liver Bx in 1974 --> Tx'd with systemic chemotherapy + chest XRT --> recurrence 2 years later requiring additional systemic chemotherapy and XRT   Hypothyroidism    Lower leg edema    Mitral valve stenosis    a.) s/p minimally invasive MVR 09/13/2017 --> 25 mm Medtronic Mosaic porcine stented bioprosthetic tissue valve   NSVT (nonsustained ventricular tachycardia) (HCC)    Osteoporosis    lumbar verterbral fracture, left ankle fracture, dexa 2015   Pancreatitis    Pneumonia    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   a.) Medtronic  Azure XT DR MRI SureScan [id number] (SN: [id number])   Pulmonary HTN (HCC)    a.) TTE: 05/14/14: RVSP 65; b.) TTE 08/31/15: RVSP 51, c.) TTE 12/23/16: RVSP 78; d.) RHC 06/27/17: mRA 6, mPA 37, mPCWP 18, AO sat 99, PA sat 67, CO 4.15, CI 2.43, PVR 4.6, AoV peak-peak 12, mitral MPG 11; e.) TTE 11/10/17: RVSP 63, f.) TTE 10/19/18: RVSP 52; g.) TTE 10/09/20: RVSP 42.9; h.) TTE 12/22/21: RVSP 54.7; i.) TTE 05/17/22: 47.7; j.) TTE 05/26/22: RVSP 40.5; k.) TTE 05/30/23: RVSP 37.1   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/07/2022   a.) s/p AVR 04/07/2022 --> 19 mm Inspiris Resilia valve   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 09/13/2017   a.) 25 mm Medtronic Mosaic porcine stented  bioprosthetic tissue valve   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   Left medial lower leg   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   right anterior thigh, EDC 08/10/22   Syncope    Tachycardia    Past Surgical History:  Procedure Laterality Date   AORTIC VALVE REPLACEMENT N/A 04/07/2022   Procedure: AORTIC VALVE REPLACEMENT (AVR), USING INSPIRIS 19 MM AORTIC VALVE;  Surgeon: Lucas Dorise POUR, MD;  Location: [facility] OR;  Service: Open Heart Surgery;  Laterality: N/A;   ATRIAL FLUTTER ABLATION N/A 1998   BUBBLE STUDY  01/12/2022   Procedure: BUBBLE STUDY;  Surgeon: Barbaraann Darryle Ned, MD;  Location: [facility] ENDOSCOPY;  Service: Cardiovascular;;   CARDIAC ELECTROPHYSIOLOGY MAPPING AND ABLATION  1999   CATARACT EXTRACTION, BILATERAL     COLONOSCOPY     IR RADIOLOGY PERIPHERAL GUIDED IV START  08/03/2017   IR THORACENTESIS ASP PLEURAL SPACE W/IMG GUIDE  05/23/2022   IR THORACENTESIS ASP PLEURAL SPACE W/IMG GUIDE  05/24/2022   IR US  GUIDE VASC ACCESS RIGHT  08/03/2017   LAPAROTOMY N/A 1974   MASTECTOMY Left 2005   MICROLARYNGOSCOPY Bilateral 08/22/2023   Procedure: SUSPENSION MICROLARYNGOSCOPY WITH INJECTION;  Surgeon: Milissa Hamming, MD;  Location: [facility] ORS;  Service: ENT;  Laterality: Bilateral;   MITRAL VALVE REPAIR Right 09/13/2017   Procedure: MINIMALLY INVASIVE MITRAL VALVE REPLACEMENT (MVR) with Medtronic Mosaic Porcine Heart Valve size 25mm;  Surgeon: Dusty Sudie DEL, MD;  Location: [facility] OR;  Service: Open Heart Surgery;  Laterality: Right;   ORIF ANKLE FRACTURE Left 03/21/2014   Procedure: LEFT ANKLE FRACTURE OPEN TREATMENT BILMALLEOLAR INCLUDES INTERNAL FIXATION ;  Surgeon: Evalene JONETTA Chancy, MD;  Location:  SURGERY CENTER;  Service: Orthopedics;  Laterality: Left;   PACEMAKER IMPLANT N/A 09/19/2017   Medtronic Azure XT MRI conditional dual-chamber pacemaker for symptomatic complete heart block  by Dr Kelsie   RIGHT/LEFT HEART CATH AND CORONARY ANGIOGRAPHY N/A  06/27/2017   Procedure: Right/Left Heart Cath and Coronary Angiography;  Surgeon: Rolan Ezra RAMAN, MD;  Location: [facility] INVASIVE CV LAB;  Service: Cardiovascular;  Laterality: N/A;   SPLENECTOMY  1974   hodgkins disease   TEE WITHOUT CARDIOVERSION  06/12/2012   Procedure: TRANSESOPHAGEAL ECHOCARDIOGRAM (TEE);  Surgeon: Ezra RAMAN Rolan, MD;  Location: [facility] ENDOSCOPY;  Service: Cardiovascular;  Laterality: N/A;   TEE WITHOUT CARDIOVERSION N/A 06/29/2017   Procedure: TRANSESOPHAGEAL ECHOCARDIOGRAM (TEE);  Surgeon: Jeffrie Oneil BROCKS, MD;  Location: [facility] ENDOSCOPY;  Service: Cardiovascular;  Laterality: N/A;   TEE WITHOUT CARDIOVERSION N/A 09/13/2017   Procedure: TRANSESOPHAGEAL ECHOCARDIOGRAM (TEE);  Surgeon: Dusty Sudie DEL, MD;  Location: [facility] OR;  Service: Open Heart Surgery;  Laterality: N/A;   TEE WITHOUT CARDIOVERSION N/A 01/12/2022  Procedure: TRANSESOPHAGEAL ECHOCARDIOGRAM (TEE);  Surgeon: Barbaraann Darryle Ned, MD;  Location: [facility] ENDOSCOPY;  Service: Cardiovascular;  Laterality: N/A;   TEE WITHOUT CARDIOVERSION N/A 04/07/2022   Procedure: TRANSESOPHAGEAL ECHOCARDIOGRAM (TEE);  Surgeon: Lucas Dorise POUR, MD;  Location: [facility] OR;  Service: Open Heart Surgery;  Laterality: N/A;   TONSILLECTOMY AND ADENOIDECTOMY     Patient Active Problem List   Diagnosis Date Noted   NSVT (nonsustained ventricular tachycardia) (HCC) 06/29/2023   Paroxysmal atrial fibrillation (HCC) 06/29/2023   Pulmonary valve disease 04/18/2023   Pleural effusion 05/22/2022   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/07/2022   Pacemaker 03/08/2022   CHB (complete heart block) (HCC) 12/27/2021   S/P MVR (mitral valve repair) 10/02/2019   Pneumonia    Hodgkin's lymphoma (HCC)    GERD (gastroesophageal reflux disease)    COPD (chronic obstructive pulmonary disease) (HCC)    Atrial flutter (HCC)    Breast cancer (HCC)    Bleeding gastric ulcer    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 09/13/2017   Pulmonary  HTN (HCC) 06/25/2017   Mitral valve stenosis 04/25/2017   Aortic valve regurgitation 04/25/2017   Osteoporosis    Pancreatitis    Prediabetes    Acute respiratory failure with hypoxia (HCC) 01/07/2015   Hodgkin lymphoma (HCC) 01/07/2015   Abdominal pain, epigastric    Diarrhea    AKI (acute kidney injury) (HCC)    Hyponatremia    Acute pancreatitis 12/25/2014   Leukocytosis    Mitral regurgitation 06/07/2012   Syncope 04/26/2012   HTN (hypertension) 04/26/2012   Abnormal stress test 04/26/2012   Hyperkalemia 04/26/2012   Tachycardia 04/26/2012    ONSET DATE:  04/06/2022; date of referral 11/30/2023  REFERRING DIAG: Dysphonia (R49.0); 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 On 08/22/2023 pt underwent bilateral injection of Prolaryn VOICE GEL d/t more bowing noted on left on laryngoscopy during procedure than appreciate in office. On 11/30/2023 laryngoscopy revealed moderate glottic gap with phonation and mild weakness of right vocal fold. Further ENT not reported pt continue to have dysphonia with moderate glottic gap. Initially right vocal fold was immobile then twitching at the time of injection with voice gel. Now with pretty good movement. No improvement per patient in dysphonia and choking. Continues to have dysphagia and coughing and swallowing issues. Reviewed MBSS. Will make speech referral.  Of note, pt with MBSS on 04/19/2023 [pt presents with mild oropharyngeal swallow that is without penetration or aspiration of any tested consistency (thin, nectar thick, honey thick, puree, solid, 13mm barium tablet). SLP suspects that patient's sensation of choking with foods is  related to likely slow transit of heavier, more dense boluses and possibly brief stasis of these boluses within pharynx.   CT soft tissue neck 04/25/2023 1. Symmetric appearance of the glottis, no detected cord paresis.    PAIN:  Are you having pain? No   FALLS: Has patient fallen in last 6 months? No,  LIVING ENVIRONMENT: Lives with: lives alone Lives in: House/apartment  PLOF: Independent  SUBJECTIVE STATEMENT: Pt brought in water, sipped on throughout session Pt accompanied by: self  PATIENT GOALS    to improve vocal quality and swallow ability  OBJECTIVE:   TODAY'S TREATMENT:  Skilled treatment session focused  on pt's dysphonia goals. SLP facilitated session by providing the following interventions:  Patient instructed in flow phonation through skill level 1: establish airflow release: Unarticulated: Min A  Articulated (unvoiced): patient demonstrates improving mastery of maintaining airflow while moving articulators.  Min A;  SKILL LEVEL 2:  Airflow + Voicing - Unarticulated:  best success occasional Min A:  Airflow + Voicing - Articulated: having best response to imitating therapist with meaningful phrases.:   You have been issued a Respiratory Muscle Strength Trainer(s) to increase lung strength for improved speech and/or swallowing abilities. Please follow the instructions below provided by your Speech-Language Pathologist to complete your exercises. PT able to achieve a self-rate effort level of 5 out of 10 when completing 3 sets of 8 with EMST set at [facility].    Skilled observation was provided of pt consuming thin liquids via water bottle top. She continues to be free of any overt s/s of aspiration with the appearance of a swift pharyngeal swallow. Pt reports that she swallows more frequently in the morning d/t her allergies. She also reports that she has a lot of throat clearing after she completes a meal (but not during). Suspicious for possible reflux.     PATIENT  EDUCATION: Education details: see the above Person educated: Patient Education method: Explanation Education comprehension: needs further education   HOME EXERCISE PROGRAM: Abdominal breathing Sustained /s/ using abdominal breathing Pulsing /s/ Suppression of throat clears  GOALS: Goals reviewed with patient? Yes  SHORT TERM GOALS: Target date: 10 sessions  Patient will participate in objective swallowing evaluation (MBSS) to identify safest diet recommendation as well as therapeutic targets.  Baseline: Goal status: INITIAL  2.  With Supervision A, pt will demonstrate understanding of result and recommendations of MBS by verbalizing salient points. Baseline:  Goal status: INITIAL  3.  The patient will demonstrate abdominal breathing patterns and steady release of breath on exhalation to optimize efficiency of voicing and decrease laryngeal hyperfunction.  Baseline:  Goal status: INITIAL   LONG TERM GOALS: Target date: 02/13/2024  Patient will improve perception of swallowing as indicated by an improvement in EAT-10 score by 12 weeks from initial swallowing therapy session.  Baseline: score = 29 Goal status: INITIAL  2.  The patient will be independent for abdominal breathing and breath support exercises.  Baseline:  Goal status: INITIAL  3.  Patient will report improved communication effectiveness as measured by PROM Baseline:  Goal status: INITIAL  4.  The patient will participate in 15-20 minutes conversation, maintaining average loudness of 75 dB and loud, good quality voice.  Baseline:  Goal status: INITIAL  ASSESSMENT:  CLINICAL IMPRESSION: Patient is a 72 y.o. female who was seen today for a behavioral voice treatment session. She continues to present with mild dysphonia that is c/b decreased respiratory support and clavicular breathing, strained, raspy and hoarse vocal quality.   Pt continues to struggle with increased tension during phonation resulting in  a strained vocal quality. She does report that people report she is talking louder and she also reports that she is not experiencing as many times when I can't speak at all. See the above treatment note for details.   OBJECTIVE IMPAIRMENTS include voice disorder and dysphagia. These impairments are limiting patient from effectively communicating at home and in community and safety when swallowing. Factors affecting potential to achieve goals and functional outcome are medical prognosis, severity of impairments, and time post onset and surgical intervention . Patient will benefit from skilled SLP services  to address above impairments and improve overall function.  REHAB POTENTIAL: Good  PLAN: SLP FREQUENCY: 1-2x/week  SLP DURATION: 8 weeks  PLANNED INTERVENTIONS: Aspiration precaution training, Diet toleration management , SLP instruction and feedback, Compensatory strategies, Patient/family education, [phone] Treatment of speech (30 or 45 min) , and [id number] Treatment of swallowing function  Levan Aloia B. Rubbie, M.S., CCC-SLP, Tree Surgeon Certified Brain Injury Specialist [facility]  [facility] Rehabilitation Services Office [phone] Ascom [phone] Fax [phone]

## 2024-01-18 ENCOUNTER — Ambulatory Visit: Payer: Medicare PPO | Admitting: Dermatology

## 2024-01-19 ENCOUNTER — Ambulatory Visit: Payer: Medicare PPO | Admitting: Speech Pathology

## 2024-01-19 DIAGNOSIS — R490 Dysphonia: Secondary | ICD-10-CM | POA: Diagnosis not present

## 2024-01-19 NOTE — Therapy (Signed)
 OUTPATIENT SPEECH LANGUAGE PATHOLOGY  VOICE TREATMENT NOTE   Patient Name: Patricia [name] MRN: [id number] DOB:December 20, 1950, 73 y.o., female Today's Date: 01/19/2024  PCP: Butler Burr, MD REFERRING PROVIDER: Carolee Hunter, MD   End of Session - 01/19/24 0844     Visit Number 8    Number of Visits 17    Date for SLP Re-Evaluation 02/13/24    Authorization Type [employer] PPO    Authorization Time Period 12/19/2023 thru 02/16/2024    Authorization - Visit Number 8    Authorization - Number of Visits 24    Progress Note Due on Visit 10    SLP Start Time 0845    SLP Stop Time  0930    SLP Time Calculation (min) 45 min    Activity Tolerance Patient tolerated treatment well             Past Medical History:  Diagnosis Date   Anemia    Aortic atherosclerosis (HCC)    Aortic insufficiency    a.) s/p AVR 04/07/2022 --> 19 mm Inspiris Resilia valve   Atrial fibrillation and flutter (HCC)    a.) s/p ablation 1998; b.) CHA2DS2-VASc: 4 (age, sex, vascular disease history, T2DM); b.) rate/rhythm maintained on oral metoprolol  tartrate; no chronic OAC   Basal cell carcinoma 06/05/2019   R low back, EDC 07/16/19   Basal cell carcinoma 04/15/2010   Right post shoulder   Basal cell carcinoma 04/15/2010   Mid back   Basal cell carcinoma 07/13/2023   left top of shoulder, [facility] 09/26/23   Basal cell carcinoma (BCC) 09/29/2016   Left post shoulder. Superficial and nodular.   BCC (basal cell carcinoma) 07/21/2021   right mid back, [facility] 09/15/2021   BCC (basal cell carcinoma) 01/27/2022   left lower abdomen, ED[facility] 07/28/22   BCC (basal cell carcinoma) 07/28/2022   right lateral forehead, ED[facility] 08/10/22   BCC (basal cell carcinoma) 07/28/2022   left buttock, EDC 08/10/22   Bilateral pleural effusion    Bleeding gastric ulcer 2000   Breast cancer, left (HCC) 2005   a.)  s/p LEFT mastectomy 2005 --> lymph nodes were (+) --> Tx'd with systemic antineoplastic chemotherapy   CAD  (coronary artery disease)    a.) cCTA 04/05/2022: Ca2+ 23 (51st %'ile for age/sex/race matched control); 25-49% mLAD calcification   CHB (complete heart block) (HCC)    a.) symptomatic; s/p Medtronic Azure XT DR MRI SureScan PPM placement 09/19/2017   COPD (chronic obstructive pulmonary disease) (HCC)    Diet-controlled type 2 diabetes mellitus (HCC)    Dysphonia    Dyspnea    GERD (gastroesophageal reflux disease)    History of basal cell carcinoma (BCC) 03/22/2021   right neck and right medial ear, Moh's   History of bilateral cataract extraction    History of blood transfusion    Hodgkin's lymphoma (HCC) 1974   a.) s/p laparotomy for splenectomy and liver Bx in 1974 --> Tx'd with systemic chemotherapy + chest XRT --> recurrence 2 years later requiring additional systemic chemotherapy and XRT   Hypothyroidism    Lower leg edema    Mitral valve stenosis    a.) s/p minimally invasive MVR 09/13/2017 --> 25 mm Medtronic Mosaic porcine stented bioprosthetic tissue valve   NSVT (nonsustained ventricular tachycardia) (HCC)    Osteoporosis    lumbar verterbral fracture, left ankle fracture, dexa 2015   Pancreatitis    Pneumonia    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   a.) Medtronic  Azure XT DR MRI SureScan [id number] (SN: [id number])   Pulmonary HTN (HCC)    a.) TTE: 05/14/14: RVSP 65; b.) TTE 08/31/15: RVSP 51, c.) TTE 12/23/16: RVSP 78; d.) RHC 06/27/17: mRA 6, mPA 37, mPCWP 18, AO sat 99, PA sat 67, CO 4.15, CI 2.43, PVR 4.6, AoV peak-peak 12, mitral MPG 11; e.) TTE 11/10/17: RVSP 63, f.) TTE 10/19/18: RVSP 52; g.) TTE 10/09/20: RVSP 42.9; h.) TTE 12/22/21: RVSP 54.7; i.) TTE 05/17/22: 47.7; j.) TTE 05/26/22: RVSP 40.5; k.) TTE 05/30/23: RVSP 37.1   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/07/2022   a.) s/p AVR 04/07/2022 --> 19 mm Inspiris Resilia valve   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 09/13/2017   a.) 25 mm Medtronic Mosaic porcine stented  bioprosthetic tissue valve   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   Left medial lower leg   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   right anterior thigh, EDC 08/10/22   Syncope    Tachycardia    Past Surgical History:  Procedure Laterality Date   AORTIC VALVE REPLACEMENT N/A 04/07/2022   Procedure: AORTIC VALVE REPLACEMENT (AVR), USING INSPIRIS 19 MM AORTIC VALVE;  Surgeon: Lucas Dorise POUR, MD;  Location: [facility] OR;  Service: Open Heart Surgery;  Laterality: N/A;   ATRIAL FLUTTER ABLATION N/A 1998   BUBBLE STUDY  01/12/2022   Procedure: BUBBLE STUDY;  Surgeon: Barbaraann Darryle Ned, MD;  Location: [facility] ENDOSCOPY;  Service: Cardiovascular;;   CARDIAC ELECTROPHYSIOLOGY MAPPING AND ABLATION  1999   CATARACT EXTRACTION, BILATERAL     COLONOSCOPY     IR RADIOLOGY PERIPHERAL GUIDED IV START  08/03/2017   IR THORACENTESIS ASP PLEURAL SPACE W/IMG GUIDE  05/23/2022   IR THORACENTESIS ASP PLEURAL SPACE W/IMG GUIDE  05/24/2022   IR US  GUIDE VASC ACCESS RIGHT  08/03/2017   LAPAROTOMY N/A 1974   MASTECTOMY Left 2005   MICROLARYNGOSCOPY Bilateral 08/22/2023   Procedure: SUSPENSION MICROLARYNGOSCOPY WITH INJECTION;  Surgeon: Milissa Hamming, MD;  Location: [facility] ORS;  Service: ENT;  Laterality: Bilateral;   MITRAL VALVE REPAIR Right 09/13/2017   Procedure: MINIMALLY INVASIVE MITRAL VALVE REPLACEMENT (MVR) with Medtronic Mosaic Porcine Heart Valve size 25mm;  Surgeon: Dusty Sudie DEL, MD;  Location: [facility] OR;  Service: Open Heart Surgery;  Laterality: Right;   ORIF ANKLE FRACTURE Left 03/21/2014   Procedure: LEFT ANKLE FRACTURE OPEN TREATMENT BILMALLEOLAR INCLUDES INTERNAL FIXATION ;  Surgeon: Evalene JONETTA Chancy, MD;  Location: [facility];  Service: Orthopedics;  Laterality: Left;   PACEMAKER IMPLANT N/A 09/19/2017   Medtronic Azure XT MRI conditional dual-chamber pacemaker for symptomatic complete heart block  by Dr Kelsie   RIGHT/LEFT HEART CATH AND CORONARY ANGIOGRAPHY N/A  06/27/2017   Procedure: Right/Left Heart Cath and Coronary Angiography;  Surgeon: Rolan Ezra RAMAN, MD;  Location: [facility] INVASIVE CV LAB;  Service: Cardiovascular;  Laterality: N/A;   SPLENECTOMY  1974   hodgkins disease   TEE WITHOUT CARDIOVERSION  06/12/2012   Procedure: TRANSESOPHAGEAL ECHOCARDIOGRAM (TEE);  Surgeon: Ezra RAMAN Rolan, MD;  Location: [facility] ENDOSCOPY;  Service: Cardiovascular;  Laterality: N/A;   TEE WITHOUT CARDIOVERSION N/A 06/29/2017   Procedure: TRANSESOPHAGEAL ECHOCARDIOGRAM (TEE);  Surgeon: Jeffrie Oneil BROCKS, MD;  Location: [facility] ENDOSCOPY;  Service: Cardiovascular;  Laterality: N/A;   TEE WITHOUT CARDIOVERSION N/A 09/13/2017   Procedure: TRANSESOPHAGEAL ECHOCARDIOGRAM (TEE);  Surgeon: Dusty Sudie DEL, MD;  Location: [facility] OR;  Service: Open Heart Surgery;  Laterality: N/A;   TEE WITHOUT CARDIOVERSION N/A 01/12/2022  Procedure: TRANSESOPHAGEAL ECHOCARDIOGRAM (TEE);  Surgeon: Barbaraann Darryle Ned, MD;  Location: [facility] ENDOSCOPY;  Service: Cardiovascular;  Laterality: N/A;   TEE WITHOUT CARDIOVERSION N/A 04/07/2022   Procedure: TRANSESOPHAGEAL ECHOCARDIOGRAM (TEE);  Surgeon: Lucas Dorise POUR, MD;  Location: [facility] OR;  Service: Open Heart Surgery;  Laterality: N/A;   TONSILLECTOMY AND ADENOIDECTOMY     Patient Active Problem List   Diagnosis Date Noted   NSVT (nonsustained ventricular tachycardia) (HCC) 06/29/2023   Paroxysmal atrial fibrillation (HCC) 06/29/2023   Pulmonary valve disease 04/18/2023   Pleural effusion 05/22/2022   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/07/2022   Pacemaker 03/08/2022   CHB (complete heart block) (HCC) 12/27/2021   S/P MVR (mitral valve repair) 10/02/2019   Pneumonia    Hodgkin's lymphoma (HCC)    GERD (gastroesophageal reflux disease)    COPD (chronic obstructive pulmonary disease) (HCC)    Atrial flutter (HCC)    Breast cancer (HCC)    Bleeding gastric ulcer    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 09/13/2017   Pulmonary  HTN (HCC) 06/25/2017   Mitral valve stenosis 04/25/2017   Aortic valve regurgitation 04/25/2017   Osteoporosis    Pancreatitis    Prediabetes    Acute respiratory failure with hypoxia (HCC) 01/07/2015   Hodgkin lymphoma (HCC) 01/07/2015   Abdominal pain, epigastric    Diarrhea    AKI (acute kidney injury) (HCC)    Hyponatremia    Acute pancreatitis 12/25/2014   Leukocytosis    Mitral regurgitation 06/07/2012   Syncope 04/26/2012   HTN (hypertension) 04/26/2012   Abnormal stress test 04/26/2012   Hyperkalemia 04/26/2012   Tachycardia 04/26/2012    ONSET DATE:  04/06/2022; date of referral 11/30/2023  REFERRING DIAG: Dysphonia (R49.0); 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 On 08/22/2023 pt underwent bilateral injection of Prolaryn VOICE GEL d/t more bowing noted on left on laryngoscopy during procedure than appreciate in office. On 11/30/2023 laryngoscopy revealed moderate glottic gap with phonation and mild weakness of right vocal fold. Further ENT not reported pt continue to have dysphonia with moderate glottic gap. Initially right vocal fold was immobile then twitching at the time of injection with voice gel. Now with pretty good movement. No improvement per patient in dysphonia and choking. Continues to have dysphagia and coughing and swallowing issues. Reviewed MBSS. Will make speech referral.  Of note, pt with MBSS on 04/19/2023 [pt presents with mild oropharyngeal swallow that is without penetration or aspiration of any tested consistency (thin, nectar thick, honey thick, puree, solid, 13mm barium tablet). SLP suspects that patient's sensation of choking with foods is  related to likely slow transit of heavier, more dense boluses and possibly brief stasis of these boluses within pharynx.   CT soft tissue neck 04/25/2023 1. Symmetric appearance of the glottis, no detected cord paresis.    PAIN:  Are you having pain? No   FALLS: Has patient fallen in last 6 months? No,  LIVING ENVIRONMENT: Lives with: lives alone Lives in: House/apartment  PLOF: Independent  SUBJECTIVE STATEMENT: Pt states she has been practicing in front of the mirror Pt accompanied by: self  PATIENT GOALS    to improve vocal quality and swallow ability  OBJECTIVE:   TODAY'S TREATMENT:  Skilled  treatment session focused on pt's dysphonia goals. SLP facilitated session by providing the following interventions:  Patient instructed in flow phonation through skill level 1: establish airflow release: Unarticulated: Min A  Articulated (unvoiced): patient demonstrates improving mastery of maintaining airflow while moving articulators.  Min A;  SKILL LEVEL 2:  Airflow + Voicing - Unarticulated:  much improved with easy onset Airflow + Voicing - Articulated: much improved with use of easy onset - pt reports that practicing in front of the mirror has been helpful - when reading novel sentences pt benefited from rare Min A for relaxed voicing  You have been issued a Respiratory Muscle Strength Trainer(s) to increase lung strength for improved speech and/or swallowing abilities. Please follow the instructions below provided by your Speech-Language Pathologist to complete your exercises. PT able to achieve a self-rate effort level of 5 out of 10 when completing 3 sets of 8 with EMST set at [facility].      PATIENT EDUCATION: Education details: see the above Person educated: Patient Education method: Explanation Education comprehension: needs further education   HOME EXERCISE PROGRAM: Abdominal breathing Sustained /s/ using abdominal breathing Pulsing /s/ Suppression of throat  clears  GOALS: Goals reviewed with patient? Yes  SHORT TERM GOALS: Target date: 10 sessions  Patient will participate in objective swallowing evaluation (MBSS) to identify safest diet recommendation as well as therapeutic targets.  Baseline: Goal status: INITIAL  2.  With Supervision A, pt will demonstrate understanding of result and recommendations of MBS by verbalizing salient points. Baseline:  Goal status: INITIAL  3.  The patient will demonstrate abdominal breathing patterns and steady release of breath on exhalation to optimize efficiency of voicing and decrease laryngeal hyperfunction.  Baseline:  Goal status: INITIAL   LONG TERM GOALS: Target date: 02/13/2024  Patient will improve perception of swallowing as indicated by an improvement in EAT-10 score by 12 weeks from initial swallowing therapy session.  Baseline: score = 29 Goal status: INITIAL  2.  The patient will be independent for abdominal breathing and breath support exercises.  Baseline:  Goal status: INITIAL  3.  Patient will report improved communication effectiveness as measured by PROM Baseline:  Goal status: INITIAL  4.  The patient will participate in 15-20 minutes conversation, maintaining average loudness of 75 dB and loud, good quality voice.  Baseline:  Goal status: INITIAL  ASSESSMENT:  CLINICAL IMPRESSION: Patient is a 73 y.o. female who was seen today for a behavioral voice treatment session. She continues to present with mild dysphonia that is c/b decreased respiratory support and clavicular breathing, strained, raspy and hoarse vocal quality.   Pt returns to session with improvement d/t completion of HEP. See the above treatment note for details.   OBJECTIVE IMPAIRMENTS include voice disorder and dysphagia. These impairments are limiting patient from effectively communicating at home and in community and safety when swallowing. Factors affecting potential to achieve goals and functional  outcome are medical prognosis, severity of impairments, and time post onset and surgical intervention . Patient will benefit from skilled SLP services to address above impairments and improve overall function.  REHAB POTENTIAL: Good  PLAN: SLP FREQUENCY: 1-2x/week  SLP DURATION: 8 weeks  PLANNED INTERVENTIONS: Aspiration precaution training, Diet toleration management , SLP instruction and feedback, Compensatory strategies, Patient/family education, [phone] Treatment of speech (30 or 45 min) , and [id number] Treatment of swallowing function  Anjolie Majer B. Rubbie, M.S., CCC-SLP, CBIS Speech-Language Pathologist Certified Brain Injury Specialist [place]  [facility]  Office [phone] Ascom [phone] Fax [phone]

## 2024-01-22 ENCOUNTER — Other Ambulatory Visit: Payer: Self-pay | Admitting: Family Medicine

## 2024-01-24 ENCOUNTER — Ambulatory Visit: Payer: Medicare PPO | Admitting: Speech Pathology

## 2024-01-26 ENCOUNTER — Ambulatory Visit: Payer: Medicare PPO | Admitting: Speech Pathology

## 2024-01-26 DIAGNOSIS — R490 Dysphonia: Secondary | ICD-10-CM

## 2024-01-26 NOTE — Therapy (Signed)
OUTPATIENT SPEECH LANGUAGE PATHOLOGY  VOICE TREATMENT NOTE DISCHARGE SUMMARY    Patient Name: Patricia [name] MRN: [id number] DOB:Jan 09, 1951, 73 y.o., female Today's Date: 01/26/2024  PCP: Lynnea Ferrier, MD REFERRING PROVIDER: Bud Face, MD   End of Session - 01/26/24 0848     Visit Number 9    Number of Visits 17    Date for SLP Re-Evaluation 02/13/24    Authorization Type [employer] PPO    Authorization Time Period 12/19/2023 thru 02/16/2024    Authorization - Visit Number 9    Authorization - Number of Visits 24    Progress Note Due on Visit 10    SLP Start Time 0845    SLP Stop Time  0930    SLP Time Calculation (min) 45 min    Activity Tolerance Patient tolerated treatment well             Past Medical History:  Diagnosis Date   Anemia    Aortic atherosclerosis (HCC)    Aortic insufficiency    a.) s/p AVR 04/07/2022 --> 19 mm Inspiris Resilia valve   Atrial fibrillation and flutter (HCC)    a.) s/p ablation 1998; b.) CHA2DS2-VASc: 4 (age, sex, vascular disease history, T2DM); b.) rate/rhythm maintained on oral metoprolol tartrate; no chronic OAC   Basal cell carcinoma 06/05/2019   R low back, ED[facility] 07/16/19   Basal cell carcinoma 04/15/2010   Right post shoulder   Basal cell carcinoma 04/15/2010   Mid back   Basal cell carcinoma 07/13/2023   left top of shoulder, ED[facility] 09/26/23   Basal cell carcinoma (BCC) 09/29/2016   Left post shoulder. Superficial and nodular.   BCC (basal cell carcinoma) 07/21/2021   right mid back, [facility] 09/15/2021   BCC (basal cell carcinoma) 01/27/2022   left lower abdomen, ED[facility] 07/28/22   BCC (basal cell carcinoma) 07/28/2022   right lateral forehead, ED[facility] 08/10/22   BCC (basal cell carcinoma) 07/28/2022   left buttock, EDC 08/10/22   Bilateral pleural effusion    Bleeding gastric ulcer 2000   Breast cancer, left (HCC) 2005   a.)  s/p LEFT mastectomy 2005 --> lymph nodes were (+) --> Tx'd with systemic  antineoplastic chemotherapy   CAD (coronary artery disease)    a.) cCTA 04/05/2022: Ca2+ 23 (51st %'ile for age/sex/race matched control); 25-49% mLAD calcification   CHB (complete heart block) (HCC)    a.) symptomatic; s/p Medtronic Azure XT DR MRI SureScan PPM placement 09/19/2017   COPD (chronic obstructive pulmonary disease) (HCC)    Diet-controlled type 2 diabetes mellitus (HCC)    Dysphonia    Dyspnea    GERD (gastroesophageal reflux disease)    History of basal cell carcinoma (BCC) 03/22/2021   right neck and right medial ear, Moh's   History of bilateral cataract extraction    History of blood transfusion    Hodgkin's lymphoma (HCC) 1974   a.) s/p laparotomy for splenectomy and liver Bx in 1974 --> Tx'd with systemic chemotherapy + chest XRT --> recurrence 2 years later requiring additional systemic chemotherapy and XRT   Hypothyroidism    Lower leg edema    Mitral valve stenosis    a.) s/p minimally invasive MVR 09/13/2017 --> 25 mm Medtronic Mosaic porcine stented bioprosthetic tissue valve   NSVT (nonsustained ventricular tachycardia) (HCC)    Osteoporosis    lumbar verterbral fracture, left ankle fracture, dexa 2015   Pancreatitis    Pneumonia    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  a.) Medtronic Azure XT DR MRI SureScan [id number] (SN: [id number])   Pulmonary HTN (HCC)    a.) TTE: 05/14/14: RVSP 65; b.) TTE 08/31/15: RVSP 51, c.) TTE 12/23/16: RVSP 78; d.) RHC 06/27/17: mRA 6, mPA 37, mPCWP 18, AO sat 99, PA sat 67, CO 4.15, CI 2.43, PVR 4.6, AoV peak-peak 12, mitral MPG 11; e.) TTE 11/10/17: RVSP 63, f.) TTE 10/19/18: RVSP 52; g.) TTE 10/09/20: RVSP 42.9; h.) TTE 12/22/21: RVSP 54.7; i.) TTE 05/17/22: 47.7; j.) TTE 05/26/22: RVSP 40.5; k.) TTE 05/30/23: RVSP 37.1   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/07/2022   a.) s/p AVR 04/07/2022 --> 19 mm Inspiris Resilia valve   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 09/13/2017   a.) 25 mm  Medtronic Mosaic porcine stented bioprosthetic tissue valve   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   Left medial lower leg   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   right anterior thigh, EDC 08/10/22   Syncope    Tachycardia    Past Surgical History:  Procedure Laterality Date   AORTIC VALVE REPLACEMENT N/A 04/07/2022   Procedure: AORTIC VALVE REPLACEMENT (AVR), USING INSPIRIS 19 MM AORTIC VALVE;  Surgeon: Alleen Borne, MD;  Location: [facility] OR;  Service: Open Heart Surgery;  Laterality: N/A;   ATRIAL FLUTTER ABLATION N/A 1998   BUBBLE STUDY  01/12/2022   Procedure: BUBBLE STUDY;  Surgeon: Sande Rives, MD;  Location: [facility] ENDOSCOPY;  Service: Cardiovascular;;   CARDIAC ELECTROPHYSIOLOGY MAPPING AND ABLATION  1999   CATARACT EXTRACTION, BILATERAL     COLONOSCOPY     IR RADIOLOGY PERIPHERAL GUIDED IV START  08/03/2017   IR THORACENTESIS ASP PLEURAL SPACE W/IMG GUIDE  05/23/2022   IR THORACENTESIS ASP PLEURAL SPACE W/IMG GUIDE  05/24/2022   IR US GUIDE VASC ACCESS RIGHT  08/03/2017   LAPAROTOMY N/A 1974   MASTECTOMY Left 2005   MICROLARYNGOSCOPY Bilateral 08/22/2023   Procedure: SUSPENSION MICROLARYNGOSCOPY WITH INJECTION;  Surgeon: Bud Face, MD;  Location: [facility] ORS;  Service: ENT;  Laterality: Bilateral;   MITRAL VALVE REPAIR Right 09/13/2017   Procedure: MINIMALLY INVASIVE MITRAL VALVE REPLACEMENT (MVR) with Medtronic Mosaic Porcine Heart Valve size 25mm;  Surgeon: Purcell Nails, MD;  Location: [facility] OR;  Service: Open Heart Surgery;  Laterality: Right;   ORIF ANKLE FRACTURE Left 03/21/2014   Procedure: LEFT ANKLE FRACTURE OPEN TREATMENT BILMALLEOLAR INCLUDES INTERNAL FIXATION ;  Surgeon: Sheral Apley, MD;  Location: [facility];  Service: Orthopedics;  Laterality: Left;   PACEMAKER IMPLANT N/A 09/19/2017   Medtronic Azure XT MRI conditional dual-chamber pacemaker for symptomatic complete heart block  by Dr Johney Frame   RIGHT/LEFT HEART  CATH AND CORONARY ANGIOGRAPHY N/A 06/27/2017   Procedure: Right/Left Heart Cath and Coronary Angiography;  Surgeon: Laurey Morale, MD;  Location: [facility] INVASIVE CV LAB;  Service: Cardiovascular;  Laterality: N/A;   SPLENECTOMY  1974   hodgkins disease   TEE WITHOUT CARDIOVERSION  06/12/2012   Procedure: TRANSESOPHAGEAL ECHOCARDIOGRAM (TEE);  Surgeon: Laurey Morale, MD;  Location: [facility] ENDOSCOPY;  Service: Cardiovascular;  Laterality: N/A;   TEE WITHOUT CARDIOVERSION N/A 06/29/2017   Procedure: TRANSESOPHAGEAL ECHOCARDIOGRAM (TEE);  Surgeon: Jake Bathe, MD;  Location: [facility] ENDOSCOPY;  Service: Cardiovascular;  Laterality: N/A;   TEE WITHOUT CARDIOVERSION N/A 09/13/2017   Procedure: TRANSESOPHAGEAL ECHOCARDIOGRAM (TEE);  Surgeon: Purcell Nails, MD;  Location: [facility] OR;  Service: Open Heart Surgery;  Laterality: N/A;   TEE WITHOUT CARDIOVERSION N/A  01/12/2022   Procedure: TRANSESOPHAGEAL ECHOCARDIOGRAM (TEE);  Surgeon: Sande Rives, MD;  Location: [facility] ENDOSCOPY;  Service: Cardiovascular;  Laterality: N/A;   TEE WITHOUT CARDIOVERSION N/A 04/07/2022   Procedure: TRANSESOPHAGEAL ECHOCARDIOGRAM (TEE);  Surgeon: Alleen Borne, MD;  Location: [facility] OR;  Service: Open Heart Surgery;  Laterality: N/A;   TONSILLECTOMY AND ADENOIDECTOMY     Patient Active Problem List   Diagnosis Date Noted   NSVT (nonsustained ventricular tachycardia) (HCC) 06/29/2023   Paroxysmal atrial fibrillation (HCC) 06/29/2023   Pulmonary valve disease 04/18/2023   Pleural effusion 05/22/2022   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/07/2022   Pacemaker 03/08/2022   CHB (complete heart block) (HCC) 12/27/2021   S/P MVR (mitral valve repair) 10/02/2019   Pneumonia    Hodgkin's lymphoma (HCC)    GERD (gastroesophageal reflux disease)    COPD (chronic obstructive pulmonary disease) (HCC)    Atrial flutter (HCC)    Breast cancer (HCC)    Bleeding gastric ulcer    S/P minimally invasive mitral valve replacement with  bioprosthetic valve 09/13/2017   Pulmonary HTN (HCC) 06/25/2017   Mitral valve stenosis 04/25/2017   Aortic valve regurgitation 04/25/2017   Osteoporosis    Pancreatitis    Prediabetes    Acute respiratory failure with hypoxia (HCC) 01/07/2015   Hodgkin lymphoma (HCC) 01/07/2015   Abdominal pain, epigastric    Diarrhea    AKI (acute kidney injury) (HCC)    Hyponatremia    Acute pancreatitis 12/25/2014   Leukocytosis    Mitral regurgitation 06/07/2012   Syncope 04/26/2012   HTN (hypertension) 04/26/2012   Abnormal stress test 04/26/2012   Hyperkalemia 04/26/2012   Tachycardia 04/26/2012    ONSET DATE:  04/06/2022; date of referral 11/30/2023  REFERRING DIAG: Dysphonia (R49.0); 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 On 08/22/2023 pt underwent bilateral injection of Prolaryn VOICE GEL d/t "more bowing noted on left on laryngoscopy during procedure than appreciate in office. On 11/30/2023 laryngoscopy revealed moderate glottic gap with phonation and mild weakness of right vocal fold. Further ENT not reported pt "continue to have dysphonia with moderate glottic gap. Initially right vocal fold was immobile then twitching at the time of injection with voice gel. Now with pretty good movement. No improvement per patient in dysphonia and choking. Continues to have dysphagia and coughing and swallowing issues. Reviewed MBSS. Will make speech referral."  Of note, pt with MBSS on 04/19/2023 [pt presents with mild oropharyngeal swallow that is without penetration or aspiration of any tested consistency (thin, nectar thick, honey thick, puree, solid, 13mm barium tablet). SLP suspects that  patient's sensation of "choking" with foods is related to likely slow transit of heavier, more dense boluses and possibly brief stasis of these boluses within pharynx.   CT soft tissue neck 04/25/2023 1. Symmetric appearance of the glottis, no detected cord paresis.    PAIN:  Are you having pain? No   FALLS: Has patient fallen in last 6 months? No,  LIVING ENVIRONMENT: Lives with: lives alone Lives in: House/apartment  PLOF: Independent  SUBJECTIVE STATEMENT: Pt missed 1 session d/t weather; brought in some Valentine candy for this writer Pt accompanied by: self  PATIENT GOALS    to improve vocal quality and swallow ability  OBJECTIVE:  TODAY'S TREATMENT:  Skilled treatment session focused on pt's dysphonia goals. SLP facilitated session by providing the following interventions:  Pt independent when completing HEP within the session. She was able to produce unvoiced and voiced production without any observed or evidence of strained physical or vocal quality. She continues to report 6-7 out of 10 effort level when producing 3 sets of 10 with EMST set at [facility]. At this time, pt has likely met max potential until re-evaluated by otolaryngologist at [facility]. Pt instructed to continue completing HEP until evlauation.      PATIENT EDUCATION: Education details: see the above Person educated: Patient Education method: Explanation Education comprehension: needs further education   HOME EXERCISE PROGRAM: Abdominal breathing Sustained /s/ using abdominal breathing Pulsing /s/ Suppression of throat clears  GOALS: Goals reviewed with patient? Yes  SHORT TERM GOALS: Target date: 10 sessions  Patient will participate in objective swallowing evaluation (MBSS) to identify safest diet recommendation as well as therapeutic targets.  Baseline: Goal status: INITIAL: deferred d/t decreased symptoms  2.  With Supervision A, pt will demonstrate understanding of result and  recommendations of MBS by verbalizing salient points. Baseline:  Goal status: INITIAL: Deferred  3.  The patient will demonstrate abdominal breathing patterns and steady release of breath on exhalation to optimize efficiency of voicing and decrease laryngeal hyperfunction.  Baseline:  Goal status: INITIAL: MET   LONG TERM GOALS: Target date: 02/13/2024  Patient will improve perception of swallowing as indicated by an improvement in EAT-10 score by 12 weeks from initial swallowing therapy session.  Baseline: score = 29 Goal status: INITIAL: MET  2.  The patient will be independent for abdominal breathing and breath support exercises.  Baseline:  Goal status: INITIAL: MET  3.  Patient will report improved communication effectiveness as measured by PROM Baseline:  Goal status: INITIAL: MET  4.  The patient will participate in 15-20 minutes conversation, maintaining average loudness of 75 dB and loud, good quality voice.  Baseline:  Goal status: INITIAL: MET  ASSESSMENT:  CLINICAL IMPRESSION: Patient is a 73 y.o. female who has been seen for behavioral voice treatments. Over the course of the treatment sessions, she has made great progress as evidenced by increased respiratory support and increased vocal intensity. In addition. Her vocal quality has improved over time of evaluation. She states, "everybody says it is getting stronger."  At this time, pt is appropriate to continue practicing on her own until evaluation by otolaryngologist at [facility].   PLAN: Discharge.   Rylea Selway B. Dreama Saa, M.S., CCC-SLP, Tree surgeon Certified Brain Injury Specialist [facility]  [facility] Rehabilitation Services Office [phone] Ascom [phone] Fax [phone]

## 2024-01-31 ENCOUNTER — Ambulatory Visit: Payer: Medicare PPO | Admitting: Speech Pathology

## 2024-02-02 ENCOUNTER — Ambulatory Visit: Payer: Medicare PPO | Admitting: Speech Pathology

## 2024-02-07 ENCOUNTER — Encounter: Payer: Medicare PPO | Admitting: Speech Pathology

## 2024-02-08 NOTE — Progress Notes (Signed)
 Remote pacemaker transmission.

## 2024-02-09 ENCOUNTER — Encounter: Payer: Medicare PPO | Admitting: Speech Pathology

## 2024-02-16 ENCOUNTER — Other Ambulatory Visit: Payer: Self-pay | Admitting: Family Medicine

## 2024-02-22 DIAGNOSIS — J3801 Paralysis of vocal cords and larynx, unilateral: Secondary | ICD-10-CM | POA: Diagnosis not present

## 2024-02-22 DIAGNOSIS — R490 Dysphonia: Secondary | ICD-10-CM | POA: Diagnosis not present

## 2024-02-22 DIAGNOSIS — R1314 Dysphagia, pharyngoesophageal phase: Secondary | ICD-10-CM | POA: Diagnosis not present

## 2024-03-07 ENCOUNTER — Ambulatory Visit: Admitting: Dermatology

## 2024-03-07 ENCOUNTER — Ambulatory Visit: Payer: Medicare PPO | Admitting: Dermatology

## 2024-03-11 DIAGNOSIS — R490 Dysphonia: Secondary | ICD-10-CM | POA: Insufficient documentation

## 2024-03-11 DIAGNOSIS — J383 Other diseases of vocal cords: Secondary | ICD-10-CM | POA: Diagnosis not present

## 2024-03-11 DIAGNOSIS — R498 Other voice and resonance disorders: Secondary | ICD-10-CM | POA: Diagnosis not present

## 2024-03-11 DIAGNOSIS — J3801 Paralysis of vocal cords and larynx, unilateral: Secondary | ICD-10-CM | POA: Diagnosis not present

## 2024-03-12 ENCOUNTER — Encounter: Payer: Self-pay | Admitting: Dermatology

## 2024-03-12 ENCOUNTER — Ambulatory Visit: Admitting: Dermatology

## 2024-03-12 DIAGNOSIS — L570 Actinic keratosis: Secondary | ICD-10-CM | POA: Diagnosis not present

## 2024-03-12 DIAGNOSIS — Z85828 Personal history of other malignant neoplasm of skin: Secondary | ICD-10-CM

## 2024-03-12 DIAGNOSIS — I8393 Asymptomatic varicose veins of bilateral lower extremities: Secondary | ICD-10-CM

## 2024-03-12 DIAGNOSIS — L821 Other seborrheic keratosis: Secondary | ICD-10-CM | POA: Diagnosis not present

## 2024-03-12 DIAGNOSIS — Z853 Personal history of malignant neoplasm of breast: Secondary | ICD-10-CM

## 2024-03-12 DIAGNOSIS — L820 Inflamed seborrheic keratosis: Secondary | ICD-10-CM | POA: Diagnosis not present

## 2024-03-12 DIAGNOSIS — L720 Epidermal cyst: Secondary | ICD-10-CM

## 2024-03-12 DIAGNOSIS — L729 Follicular cyst of the skin and subcutaneous tissue, unspecified: Secondary | ICD-10-CM

## 2024-03-12 DIAGNOSIS — Z1283 Encounter for screening for malignant neoplasm of skin: Secondary | ICD-10-CM

## 2024-03-12 DIAGNOSIS — D1801 Hemangioma of skin and subcutaneous tissue: Secondary | ICD-10-CM | POA: Diagnosis not present

## 2024-03-12 DIAGNOSIS — Z8589 Personal history of malignant neoplasm of other organs and systems: Secondary | ICD-10-CM

## 2024-03-12 DIAGNOSIS — I781 Nevus, non-neoplastic: Secondary | ICD-10-CM

## 2024-03-12 DIAGNOSIS — W908XXA Exposure to other nonionizing radiation, initial encounter: Secondary | ICD-10-CM | POA: Diagnosis not present

## 2024-03-12 DIAGNOSIS — D229 Melanocytic nevi, unspecified: Secondary | ICD-10-CM

## 2024-03-12 DIAGNOSIS — L578 Other skin changes due to chronic exposure to nonionizing radiation: Secondary | ICD-10-CM | POA: Diagnosis not present

## 2024-03-12 DIAGNOSIS — Z79899 Other long term (current) drug therapy: Secondary | ICD-10-CM

## 2024-03-12 DIAGNOSIS — L814 Other melanin hyperpigmentation: Secondary | ICD-10-CM

## 2024-03-12 DIAGNOSIS — Z7189 Other specified counseling: Secondary | ICD-10-CM

## 2024-03-12 NOTE — Progress Notes (Signed)
 Follow-Up Visit   Subjective  Patricia [name] is a 73 y.o. female who presents for the following: Skin Cancer Screening and Full Body Skin Exam hx of BCCs, SCCs, Aks, check spots face and body itchy  The patient presents for Total-Body Skin Exam (TBSE) for skin cancer screening and mole check. The patient has spots, moles and lesions to be evaluated, some may be new or changing and the patient may have concern these could be cancer.  The following portions of the chart were reviewed this encounter and updated as appropriate: medications, allergies, medical history  Review of Systems:  No other skin or systemic complaints except as noted in HPI or Assessment and Plan.  Objective  Well appearing patient in no apparent distress; mood and affect are within normal limits.  A full examination was performed including scalp, head, eyes, ears, nose, lips, neck, chest, axillae, abdomen, back, buttocks, bilateral upper extremities, bilateral lower extremities, hands, feet, fingers, toes, fingernails, and toenails. All findings within normal limits unless otherwise noted below.   Relevant physical exam findings are noted in the Assessment and Plan.  L temple, R forehead, trunk, L thigh, L pubic x 20 (20) Stuck on waxy paps with erythema R nasal bridge x 1 Pink scaly macules               Assessment & Plan   SKIN CANCER SCREENING PERFORMED TODAY.  ACTINIC DAMAGE - Chronic condition, secondary to cumulative UV/sun exposure - diffuse scaly erythematous macules with underlying dyspigmentation - Recommend daily broad spectrum sunscreen SPF 30+ to sun-exposed areas, reapply every 2 hours as needed.  - Staying in the shade or wearing long sleeves, sun glasses (UVA+UVB protection) and wide brim hats (4-inch brim around the entire circumference of the hat) are also recommended for sun protection.  - Call for new or changing lesions.  LENTIGINES, SEBORRHEIC KERATOSES, HEMANGIOMAS -  Benign normal skin lesions - Benign-appearing - Call for any changes  MELANOCYTIC NEVI - Tan-brown and/or pink-flesh-colored symmetric macules and papules - Benign appearing on exam today - Observation - Call clinic for new or changing moles - Recommend daily use of broad spectrum spf 30+ sunscreen to sun-exposed areas.   HISTORY OF BASAL CELL CARCINOMA OF THE SKIN - No evidence of recurrence today - Recommend regular full body skin exams - Recommend daily broad spectrum sunscreen SPF 30+ to sun-exposed areas, reapply every 2 hours as needed.  - Call if any new or changing lesions are noted between office visits  - Multiple  HISTORY OF SQUAMOUS CELL CARCINOMA OF THE SKIN - No evidence of recurrence today - No lymphadenopathy - Recommend regular full body skin exams - Recommend daily broad spectrum sunscreen SPF 30+ to sun-exposed areas, reapply every 2 hours as needed.  - Call if any new or changing lesions are noted between office visits - L medial lower leg, R ant thigh   Milia L cheek - start Aklief cr qhs sample x 1 Lot [id number] exp 05/2025 - tiny firm white papules - type of cyst - benign Chronic and persistent condition with duration or expected duration over one year. Condition is symptomatic / bothersome to patient. Not to goal. - sometimes these will clear with nightly OTC adapalene/Differin 0.1% gel or retinol. - may be extracted if symptomatic - observe   HISTORY OF BREAST CANCER L breast Exam: L breast mastectomy site clear to visual exam, no lymphadenopathy Treatment Plan: Observe for changes  Varicose Veins/Spider Veins Legs, feet -  Dilated blue, purple or red veins at the lower extremities - Reassured - Smaller vessels can be treated by sclerotherapy (a procedure to inject a medicine into the veins to make them disappear) if desired, but the treatment is not covered by insurance. Larger vessels may be covered if symptomatic and we would refer to vascular  surgeon if treatment desired. Discussed Sclerotherapy $350 per treatment session may take several sessions for best results    Dr. Roseanne Reno came in to see patient and patient is interested in scheduling sclerotherapy with Dr. Roseanne Reno.  (Dr. Gwen Pounds is not not doing sclerotherapy since his neck surgery) we will schedule. See spider vein photos.  INFLAMED SEBORRHEIC KERATOSIS (20) L temple, R forehead, trunk, L thigh, L pubic x 20 (20) Symptomatic, irritating, patient would like treated. Destruction of lesion - L temple, R forehead, trunk, L thigh, L pubic x 20 (20) Complexity: simple   Destruction method: cryotherapy   Informed consent: discussed and consent obtained   Timeout:  patient name, date of birth, surgical site, and procedure verified Lesion destroyed using liquid nitrogen: Yes   Region frozen until ice ball extended beyond lesion: Yes   Outcome: patient tolerated procedure well with no complications   Post-procedure details: wound care instructions given   AK (ACTINIC KERATOSIS) R nasal bridge x 1 Actinic keratoses are precancerous spots that appear secondary to cumulative UV radiation exposure/sun exposure over time. They are chronic with expected duration over 1 year. A portion of actinic keratoses will progress to squamous cell carcinoma of the skin. It is not possible to reliably predict which spots will progress to skin cancer and so treatment is recommended to prevent development of skin cancer.  Recommend daily broad spectrum sunscreen SPF 30+ to sun-exposed areas, reapply every 2 hours as needed.  Recommend staying in the shade or wearing long sleeves, sun glasses (UVA+UVB protection) and wide brim hats (4-inch brim around the entire circumference of the hat). Call for new or changing lesions. Destruction of lesion - R nasal bridge x 1 Complexity: simple   Destruction method: cryotherapy   Informed consent: discussed and consent obtained   Timeout:  patient name, date of  birth, surgical site, and procedure verified Lesion destroyed using liquid nitrogen: Yes   Region frozen until ice ball extended beyond lesion: Yes   Outcome: patient tolerated procedure well with no complications   Post-procedure details: wound care instructions given   ACTINIC SKIN DAMAGE   SKIN CANCER SCREENING   LENTIGO   MELANOCYTIC NEVUS, UNSPECIFIED LOCATION   CYST OF SKIN   COUNSELING AND COORDINATION OF CARE   MEDICATION MANAGEMENT   ASYMPTOMATIC SPIDER VEINS OF BOTH LOWER EXTREMITIES   HISTORY OF BASAL CELL CARCINOMA   HISTORY OF SQUAMOUS CELL CARCINOMA   HISTORY OF BREAST CANCER   Return in about 1 year (around 03/12/2025) for TBSE, Hx of BCC, Hx of SCC, Hx of AKs.  I, Ardis Rowan, RMA, am acting as scribe for Armida Sans, MD .   Documentation: I have reviewed the above documentation for accuracy and completeness, and I agree with the above.  Armida Sans, MD

## 2024-03-12 NOTE — Patient Instructions (Signed)
 Cryotherapy Aftercare  Wash gently with soap and water everyday.   Apply Vaseline and Band-Aid daily until healed.    BEFORE YOUR APPOINTMENT FOR SCLEROTHERAPY  1. When you telephone for your appointment for the sclerotherapy procedure, please let the receptionist know that you are scheduling for the fifteen (15) minute sclerotherapy procedure not just a regular visit.  2. On the day of the procedure, please cleanse and dry the areas, but do not use any moisturizers or other products on the area(s) to be treated.  3. Bring a pair of comfortable shorts to wear during the procedure.  4. Be sure to bring your recommended graduated compression stockings with you to the office. You will be wearing them home when your visit is over. These compression hose can be purchased at most medical supply stores.  After Your Sclerotherapy Procedure  1. Please wear the graduated compression stockings for 24 hours immediately following the completion of the sclerotherapy procedure.  2. We recommend that you avoid vigorous activity as much as possible for the first twenty-four (24) hours. You can do your "normal" routine, but avoid an above normal amount of time on your feet. Elevating the legs when sitting and avoidance of vigorous leg movements or exercise in the first few days after treatment may improve your results.  3. You may remove the compression dressings (cotton balls) and tape the next morning.  4. Please continue wearing the compression stockings during waking hours for the two (2) weeks following sclerotherapy.  5. If you have any blisters, sores or ulcers or other problems following your procedure please call or return to the office immediately.     THE PROCEDURE FEE IS $350.00 PER FIFTEEN (15) MINUTE SESSION. WE REQUIRE THAT THIS PROCEDURE BE PAID FOR IN FULL ON OR BEFORE THE DATE THAT IT IS PERFORMED. WE WILL GIVE YOU A RECEIPT THAT YOU CAN FILE WITH YOUR INSURANCE COMPANY. WE GENERALLY DO  NOT FILE THIS PROCEDURE WITH ANY INSURANCE COMPANY EXCEPT UNDER CERTAIN CIRCUMSTANCES WHERE PRIOR AUTHORIZATION HAS BEEN CONFIRMED. THIS PROCEDURE IS GENERALLY CONSIDERED TO BE A COSMETIC PROCEDURE BY INSURANCE COMPANIES.  Due to recent changes in healthcare laws, you may see results of your pathology and/or laboratory studies on MyChart before the doctors have had a chance to review them. We understand that in some cases there may be results that are confusing or concerning to you. Please understand that not all results are received at the same time and often the doctors may need to interpret multiple results in order to provide you with the best plan of care or course of treatment. Therefore, we ask that you please give [place] 2 business days to thoroughly review all your results before contacting the office for clarification. Should we see a critical lab result, you will be contacted sooner.   If You Need Anything After Your Visit  If you have any questions or concerns for your doctor, please call our main line at [phone] and press option 4 to reach your doctor's medical assistant. If no one answers, please leave a voicemail as directed and we will return your call as soon as possible. Messages left after 4 pm will be answered the following business day.   You may also send [place] a message via MyChart. We typically respond to MyChart messages within 1-2 business days.  For prescription refills, please ask your pharmacy to contact our office. Our fax number is [phone].  If you have an urgent issue when the clinic is  closed that cannot wait until the next business day, you can page your doctor at the number below.    Please note that while we do our best to be available for urgent issues outside of office hours, we are not available 24/7.   If you have an urgent issue and are unable to reach [place], you may choose to seek medical care at your doctor's office, retail clinic, urgent care center, or  emergency room.  If you have a medical emergency, please immediately call 911 or go to the emergency department.  Pager Numbers  - Dr. Gwen Pounds: [phone]  - Dr. Roseanne Reno: [phone]  - Dr. Katrinka Blazing: [phone]   In the event of inclement weather, please call our main line at [phone] for an update on the status of any delays or closures.  Dermatology Medication Tips: Please keep the boxes that topical medications come in in order to help keep track of the instructions about where and how to use these. Pharmacies typically print the medication instructions only on the boxes and not directly on the medication tubes.   If your medication is too expensive, please contact our office at [phone] option 4 or send [place] a message through MyChart.   We are unable to tell what your co-pay for medications will be in advance as this is different depending on your insurance coverage. However, we may be able to find a substitute medication at lower cost or fill out paperwork to get insurance to cover a needed medication.   If a prior authorization is required to get your medication covered by your insurance company, please allow [place] 1-2 business days to complete this process.  Drug prices often vary depending on where the prescription is filled and some pharmacies may offer cheaper prices.  The website www.goodrx.com contains coupons for medications through different pharmacies. The prices here do not account for what the cost may be with help from insurance (it may be cheaper with your insurance), but the website can give you the price if you did not use any insurance.  - You can print the associated coupon and take it with your prescription to the pharmacy.  - You may also stop by our office during regular business hours and pick up a GoodRx coupon card.  - If you need your prescription sent electronically to a different pharmacy, notify our office through [facility] or by phone at  [phone] option 4.     Si Usted Necesita Algo Despus de Su Visita  Tambin puede enviarnos un mensaje a travs de Clinical cytogeneticist. Por lo general respondemos a los mensajes de MyChart en el transcurso de 1 a 2 das hbiles.  Para renovar recetas, por favor pida a su farmacia que se ponga en contacto con nuestra oficina. Annie Sable de fax es [place] [phone].  Si tiene un asunto urgente cuando la clnica est cerrada y que no puede esperar hasta el siguiente da hbil, puede llamar/localizar a su doctor(a) al nmero que aparece a continuacin.   Por favor, tenga en cuenta que aunque hacemos todo lo posible para estar disponibles para asuntos urgentes fuera del horario de [place], no estamos disponibles las 24 horas del da, los 7 [address]  [address] de la [place].   Si tiene un problema urgente y no puede comunicarse con nosotros, puede optar por buscar atencin mdica  en el consultorio de su doctor(a), en una clnica privada, en un centro de atencin urgente o en una sala de emergencias.  Si tiene Radio broadcast assistant  mdica, por favor llame inmediatamente al 911 o vaya a la sala de emergencias.  Nmeros de bper  - Dr. Gwen Pounds: [phone]  - Dra. Roseanne Reno: [phone]  - Dr. Katrinka Blazing: [phone]   En caso de inclemencias del tiempo, por favor llame a Lacy Duverney principal al [phone] para una actualizacin sobre el [place] de cualquier retraso o cierre.  Consejos para la medicacin en dermatologa: Por favor, guarde las cajas en las que vienen los medicamentos de uso tpico para ayudarle a seguir las instrucciones sobre dnde y cmo usarlos. Las farmacias generalmente imprimen las instrucciones del medicamento slo en las cajas y no directamente en los tubos del [place].   Si su medicamento es muy caro, por favor, pngase en contacto con Rolm Gala llamando al [phone] y presione la opcin 4 o envenos un mensaje a travs de Clinical cytogeneticist.   No podemos decirle cul ser su copago por los  medicamentos por adelantado ya que esto es diferente dependiendo de la cobertura de su seguro. Sin embargo, es posible que podamos encontrar un medicamento sustituto a Audiological scientist un formulario para que el seguro cubra el medicamento que se considera necesario.   Si se requiere una autorizacin previa para que su compaa de seguros [place] su medicamento, por favor permtanos de 1 a 2 das hbiles para completar [address]  Los precios de los medicamentos varan con frecuencia dependiendo del Environmental consultant de dnde se surte la receta y alguna farmacias pueden ofrecer precios ms baratos.  El sitio web www.goodrx.com tiene cupones para medicamentos de Health and safety inspector. Los precios aqu no tienen en cuenta lo que podra costar con la ayuda del seguro (puede ser ms barato con su seguro), pero el sitio web puede darle el precio si no utiliz Tourist information centre manager.  - Puede imprimir el cupn correspondiente y llevarlo con su receta a la farmacia.  - Tambin puede pasar por nuestra oficina durante el horario de atencin regular y Education officer, museum una tarjeta de cupones de GoodRx.  - Si necesita que su receta se enve electrnicamente a una farmacia diferente, informe a nuestra oficina a travs de MyChart de [place] o por telfono llamando al [phone] y presione la opcin 4.

## 2024-03-17 ENCOUNTER — Encounter (HOSPITAL_COMMUNITY): Payer: Self-pay

## 2024-03-17 ENCOUNTER — Emergency Department (HOSPITAL_COMMUNITY): Admission: EM | Admit: 2024-03-17 | Discharge: 2024-03-17 | Disposition: A | Discharge status: Home / Self Care

## 2024-03-17 ENCOUNTER — Emergency Department (HOSPITAL_COMMUNITY)

## 2024-03-17 ENCOUNTER — Other Ambulatory Visit: Payer: Self-pay

## 2024-03-17 DIAGNOSIS — J449 Chronic obstructive pulmonary disease, unspecified: Secondary | ICD-10-CM | POA: Diagnosis not present

## 2024-03-17 DIAGNOSIS — J90 Pleural effusion, not elsewhere classified: Secondary | ICD-10-CM | POA: Diagnosis not present

## 2024-03-17 DIAGNOSIS — Z7951 Long term (current) use of inhaled steroids: Secondary | ICD-10-CM | POA: Insufficient documentation

## 2024-03-17 DIAGNOSIS — E119 Type 2 diabetes mellitus without complications: Secondary | ICD-10-CM | POA: Insufficient documentation

## 2024-03-17 DIAGNOSIS — Z8571 Personal history of Hodgkin lymphoma: Secondary | ICD-10-CM | POA: Diagnosis not present

## 2024-03-17 DIAGNOSIS — Z950 Presence of cardiac pacemaker: Secondary | ICD-10-CM | POA: Diagnosis not present

## 2024-03-17 DIAGNOSIS — R059 Cough, unspecified: Secondary | ICD-10-CM | POA: Diagnosis not present

## 2024-03-17 DIAGNOSIS — I509 Heart failure, unspecified: Secondary | ICD-10-CM | POA: Diagnosis not present

## 2024-03-17 DIAGNOSIS — R079 Chest pain, unspecified: Secondary | ICD-10-CM | POA: Diagnosis not present

## 2024-03-17 DIAGNOSIS — I110 Hypertensive heart disease with heart failure: Secondary | ICD-10-CM | POA: Diagnosis not present

## 2024-03-17 DIAGNOSIS — J9811 Atelectasis: Secondary | ICD-10-CM | POA: Diagnosis not present

## 2024-03-17 DIAGNOSIS — R519 Headache, unspecified: Secondary | ICD-10-CM | POA: Diagnosis not present

## 2024-03-17 DIAGNOSIS — J810 Acute pulmonary edema: Secondary | ICD-10-CM | POA: Insufficient documentation

## 2024-03-17 DIAGNOSIS — R0602 Shortness of breath: Secondary | ICD-10-CM | POA: Diagnosis not present

## 2024-03-17 LAB — CBC WITH DIFFERENTIAL/PLATELET
Abs Immature Granulocytes: 0.04 10*3/uL (ref 0.00–0.07)
Basophils Absolute: 0.1 10*3/uL (ref 0.0–0.1)
Basophils Relative: 1 %
Eosinophils Absolute: 0.1 10*3/uL (ref 0.0–0.5)
Eosinophils Relative: 2 %
HCT: 38.8 % (ref 36.0–46.0)
Hemoglobin: 12.8 g/dL (ref 12.0–15.0)
Immature Granulocytes: 1 %
Lymphocytes Relative: 10 %
Lymphs Abs: 0.8 10*3/uL (ref 0.7–4.0)
MCH: 29.8 pg (ref 26.0–34.0)
MCHC: 33 g/dL (ref 30.0–36.0)
MCV: 90.4 fL (ref 80.0–100.0)
Monocytes Absolute: 1 10*3/uL (ref 0.1–1.0)
Monocytes Relative: 12 %
Neutro Abs: 5.9 10*3/uL (ref 1.7–7.7)
Neutrophils Relative %: 74 %
Platelets: 363 10*3/uL (ref 150–400)
RBC: 4.29 MIL/uL (ref 3.87–5.11)
RDW: 15.6 % — ABNORMAL HIGH (ref 11.5–15.5)
WBC: 7.8 10*3/uL (ref 4.0–10.5)
nRBC: 0 % (ref 0.0–0.2)

## 2024-03-17 LAB — TROPONIN I (HIGH SENSITIVITY)
Troponin I (High Sensitivity): 9 ng/L (ref <18)
Troponin I (High Sensitivity): 9 ng/L (ref <18)

## 2024-03-17 LAB — COMPREHENSIVE METABOLIC PANEL WITH GFR
ALT: 23 U/L (ref 0–44)
AST: 40 U/L (ref 15–41)
Albumin: 3.8 g/dL (ref 3.5–5.0)
Alkaline Phosphatase: 89 U/L (ref 38–126)
Anion gap: 12 (ref 5–15)
BUN: 18 mg/dL (ref 8–23)
CO2: 26 mmol/L (ref 22–32)
Calcium: 9.7 mg/dL (ref 8.9–10.3)
Chloride: 97 mmol/L — ABNORMAL LOW (ref 98–111)
Creatinine, Ser: 1.39 mg/dL — ABNORMAL HIGH (ref 0.44–1.00)
GFR, Estimated: 40 mL/min — ABNORMAL LOW (ref >60)
Glucose, Bld: 138 mg/dL — ABNORMAL HIGH (ref 70–99)
Potassium: 4.3 mmol/L (ref 3.5–5.1)
Sodium: 135 mmol/L (ref 135–145)
Total Bilirubin: 0.7 mg/dL (ref 0.0–1.2)
Total Protein: 7 g/dL (ref 6.5–8.1)

## 2024-03-17 LAB — RESP PANEL BY RT-PCR (RSV, FLU A&B, COVID)  RVPGX2
Influenza A by PCR: NEGATIVE
Influenza B by PCR: NEGATIVE
Resp Syncytial Virus by PCR: NEGATIVE
SARS Coronavirus 2 by RT PCR: NEGATIVE

## 2024-03-17 MED ORDER — IPRATROPIUM-ALBUTEROL 0.5-2.5 (3) MG/3ML IN SOLN
3.0000 mL | Freq: Once | RESPIRATORY_TRACT | Status: AC
  Administered 2024-03-17: 3 mL via RESPIRATORY_TRACT
  Filled 2024-03-17: qty 3

## 2024-03-17 MED ORDER — TORSEMIDE 20 MG PO TABS: 20.0000 mg | ORAL_TABLET | Freq: Two times a day (BID) | ORAL | 0 refills | Status: DC

## 2024-03-17 MED ORDER — TORSEMIDE 20 MG PO TABS: 10.0000 mg | ORAL_TABLET | Freq: Every day | ORAL | 0 refills | Status: DC

## 2024-03-17 NOTE — Discharge Instructions (Addendum)
 Please take the increased dose of torsemide starting tonight.  I have placed a a consult to our cardiology team so they should reach out to get you a follow-up appointment.  Start with the 20 mg twice daily of the torsemide.  Return to the ER for worsening symptoms.

## 2024-03-17 NOTE — ED Provider Notes (Signed)
 [place] EMERGENCY DEPARTMENT AT [facility] Provider Note   CSN: [id number] Arrival date & time: 03/17/24  1019     History  No chief complaint on file.   Patricia [name] is a 73 y.o. female.  73 year old female with past medical history of diet-controlled diabetes, COPD, pulmonary hypertension, atrial fibrillation, and Hodgkin's lymphoma in remission presenting to the emergency department today with chest tightness and shortness of breath.  The patient states that this is been going on now for the past week or so.  The patient does report a 4 pound weight gain during this time.  She does report that she has feel that her legs are little more swollen than normal.  She has been taking her torsemide as prescribed.  She is currently taking 10 mg twice daily.  She has had a cough that has been nonproductive.  Does report that her shortness of breath is worse with lying flat.  She came to the ER today due to ongoing symptoms.  She denies any fevers.        Home Medications Prior to Admission medications   Medication Sig Start Date End Date Taking? Authorizing Provider  acetaminophen (TYLENOL) 325 MG tablet Take 2 tablets (650 mg total) by mouth 2 (two) times daily as needed for moderate pain or headache. 09/21/17   Ardelle Balls, PA-C  alendronate (FOSAMAX) 70 MG tablet TAKE ONE TABLET BY MOUTH ONCE A WEEK 01/22/24   Donita Brooks, MD  Blood Glucose Monitoring Suppl (ACCU-CHEK GUIDE) w/Device KIT 1 Units by Does not apply route daily. 12/25/19   Donita Brooks, MD  Calcium Carbonate (CALCIUM 600 PO) Take 600 mg by mouth daily. One a day    [provider]  CREON 36000-114000 units CPEP capsule Take 36,000 Units by mouth 3 (three) times daily before meals. 12/23/21   [provider]  esomeprazole (NEXIUM) 40 MG capsule Take 40 mg by mouth daily before breakfast.  07/02/15   [provider]  fluticasone (FLONASE) 50 MCG/ACT nasal spray PLACE  TWO SPRAYS INTO EACH NOSTRIL DAILY 01/22/24   Donita Brooks, MD  glucose blood (ACCU-CHEK GUIDE) test strip USE TO TEST BLOOD SUGARS EVERY MORNING 03/21/23   Donita Brooks, MD  Lancets (ACCU-CHEK SOFT TOUCH) lancets Check BS QAM 07/08/20   Donita Brooks, MD  Lancets Misc. (ACCU-CHEK SOFTCLIX LANCET DEV) KIT Check BS QAM 12/25/19   Donita Brooks, MD  levothyroxine (SYNTHROID) 75 MCG tablet Take 1 tablet (75 mcg total) by mouth daily. Patient taking differently: Take 75 mcg by mouth daily before breakfast. 05/10/23   Donita Brooks, MD  metoprolol tartrate (LOPRESSOR) 100 MG tablet TAKE ONE TABLET BY MOUTH TWICE A DAY 08/18/23   Marinus Maw, MD  Multiple Vitamin (MULTIVITAMIN WITH MINERALS) TABS tablet Take 1 tablet by mouth daily with lunch. Centrum Silver.    [provider]  ondansetron (ZOFRAN) 4 MG tablet Take 1 tablet (4 mg total) by mouth every 8 (eight) hours as needed for nausea or vomiting. 08/22/23   Bud Face, MD  torsemide (DEMADEX) 20 MG tablet Take 1 tablet (20 mg total) by mouth 2 (two) times daily. 03/17/24   Durwin Glaze, MD      Allergies    Aspirin, Azithromycin, Penicillins, Sulfa antibiotics, and Cheese    Review of Systems   Review of Systems  Respiratory:  Positive for cough and shortness of breath.   Cardiovascular:  Positive for leg  swelling.  All other systems reviewed and are negative.   Physical Exam Updated Vital Signs BP (!) 154/77   Pulse 95   Temp 98.6 F (37 C) (Oral)   Resp 20   SpO2 98%  Physical Exam Vitals and nursing note reviewed.   Gen: NAD, speaking in full sentences, coughing intermittently during interview Eyes: PERRL, EOMI HEENT: no oropharyngeal swelling Neck: trachea midline Resp: Diminished at lung bases Card: RRR, no murmurs, rubs, or gallops Abd: nontender, nondistended Extremities: no calf tenderness, trace pitting edema bilaterally Vascular: 2+ radial pulses bilaterally, 2+ DP pulses  bilaterally Skin: no rashes Psyc: acting appropriately   ED Results / Procedures / Treatments   Labs (all labs ordered are listed, but only abnormal results are displayed) Labs Reviewed  CBC WITH DIFFERENTIAL/PLATELET - Abnormal; Notable for the following components:      Result Value   RDW 15.6 (*)    All other components within normal limits  COMPREHENSIVE METABOLIC PANEL WITH GFR - Abnormal; Notable for the following components:   Chloride 97 (*)    Glucose, Bld 138 (*)    Creatinine, Ser 1.39 (*)    GFR, Estimated 40 (*)    All other components within normal limits  RESP PANEL BY RT-PCR (RSV, FLU A&B, COVID)  RVPGX2  BRAIN NATRIURETIC PEPTIDE  TROPONIN I (HIGH SENSITIVITY)  TROPONIN I (HIGH SENSITIVITY)    EKG EKG Interpretation Date/Time:  Sunday March 17 2024 10:25:21 EDT Ventricular Rate:  91 PR Interval:  224 QRS Duration:  143 QT Interval:  399 QTC Calculation: 491 R Axis:   96  Text Interpretation: Sinus rhythm Prolonged PR interval Left bundle branch block Confirmed by Beckey Downing [phone]) on 03/17/2024 10:46:01 AM  Radiology DG Chest 2 View Result Date: 03/17/2024 CLINICAL DATA:  Chest pain. EXAM: CHEST - 2 VIEW COMPARISON:  09/14/2022 FINDINGS: The heart size and mediastinal contours are within normal limits. Prior CABG and aortic valve replacement again noted. Prominent to pacemaker remains in appropriate position. New small bilateral pleural effusions are seen bibasilar atelectasis. Increased diffuse interstitial prominence is suspicious for mild interstitial edema. IMPRESSION: New small bilateral pleural effusions and bibasilar atelectasis. Increased diffuse interstitial prominence, suspicious for mild interstitial edema. Electronically Signed   By: Danae Orleans M.D.   On: 03/17/2024 11:21    Procedures Procedures    Medications Ordered in ED Medications  ipratropium-albuterol (DUONEB) 0.5-2.5 (3) MG/3ML nebulizer solution 3 mL (3 mLs Nebulization Given  03/17/24 1038)    ED Course/ Medical Decision Making/ A&P                                 Medical Decision Making 73 year old female with past medical history of COPD, pulmonary hypertension, atrial fibrillation, Hodgkin's lymphoma in remission, and pulmonic valve regurgitation presenting to the emergency department today with shortness of breath.  I will further evaluate the patient here with basic labs as well as an EKG, chest x-ray, and troponin further evaluation for ACS, pulmonary edema, pulmonary infiltrates, pneumothorax or CHF exacerbation.  The patient has somewhat diminished here on exam without a lot of wheezing.  I will give her DuoNeb to see if this helps with her symptoms at all.  Will also obtain RSV/COVID/flu swab to evaluate for infectious etiologies.  She does report a mild weight gain so this very well may be due to mild CHF exacerbation.  She is not requiring any oxygen.  If  this is the case we will discuss her case with cardiology to see about adjusting her medications.  I will reevaluate for ultimate disposition.  The patient's chest x-ray does show some mild pulmonary edema.  Her pulse ox is 98 to 100% here on room air.  Creatinine seems to be at baseline.  The remainder of her workup is reassuring.  I did call and discussed this with Dr. Rennis Golden who recommends increasing her dose of torsemide.  This will be done and the patient be discharged with return precautions.  She is in agreement with this plan.  Amount and/or Complexity of Data Reviewed Labs: ordered.  Risk Prescription drug management.           Final Clinical Impression(s) / ED Diagnoses Final diagnoses:  Congestive heart failure, unspecified HF chronicity, unspecified heart failure type (HCC)    Rx / DC Orders ED Discharge Orders          Ordered    torsemide (DEMADEX) 20 MG tablet  Daily,   Status:  Discontinued        03/17/24 1438    Ambulatory referral to Cardiology       Comments: If you have  not heard from the Cardiology office within the next 72 hours please call [phone].   03/17/24 1438    torsemide (DEMADEX) 20 MG tablet  2 times daily        03/17/24 1439              Durwin Glaze, MD 03/17/24 1439

## 2024-03-17 NOTE — ED Triage Notes (Signed)
 [facility] reports pt coming from home c/o chest pain and sob that started a week ago. Heaviness that goes through to her back. Hx of CHF and pacemaker.

## 2024-03-18 NOTE — Progress Notes (Signed)
 Cardiology Office Note:  .   Date:  03/26/2024  ID:  KADIJAH SHAMOON, DOB 07-Jul-1951, MRN [id number] PCP: Austine Lefort, MD  [facility] Providers Cardiologist:  Eilleen Grates, MD Electrophysiologist:  Manya Sells, MD    History of Present Illness: .   Patricia [name] is a 73 y.o. female with a past medical history of AVR with Inspiris bioprosthetic valve in 03/2022, PAF, bioprosthetic MVR in 2018, CHB s/p PPM, atrial flutter s/p ablation. Pre-DM, COPD, breast cancer, basal cell cancer, Hodgkin's lymphoma. Patient is followed by Dr. Lavonne Prairie and Dr. Carolynne Citron. Presents today for a hospital follow up appointment   Per chart review, patient underwent echocardiogram on 12/23/16 that showed EF 55-60%, no regional wall motion abnormalities, grade II DD, moderate AI, severe mitral valve stenosis, moderate-severe mitral valve regurgitation. Underwent R/L heart catheterization 06/27/17 that showed no significant coronary disease, normal LV systolic function, 3+ mitral regurgitation and severe mitral stenosis, mild aortic stenosis. Underwent MVR in 102/18 with a 25 mm Medtronic Mosaic porcine valve. After her surgery, she developed CHB. Had a Medtronic PPM implanted in 09/2017. Later, echocardiogram in 10/2017 showed EF 50-55%, mild AI, stable function of the mitral valve prosthesis.   In 12/2021, patient reported dyspnea. Echocardiogram on 12/22/21 that showed moderate-severe AI, EF 55-60%, mildly reduced RV systolic function, normal structure and function of the mitral valve prosthesis. Underwent TEE in 01/2022 that confirmed severe aortic regurgitation. Coronary CTA on 04/05/22 showed a coronary calcium score of 23 (51 percentile), mid calcified LAD plaque in the first diagonal 25-49%. She was referred to CT surgery and underwent AVR in 03/2022 with a bioprosthetic valve. She had recurrent PAF s/p surgery.   Most recent echocardiogram from 05/30/23 showed EF 50-55% with septal hypokinesis, mildly  reduced RV systolic function, mildly elevated pa systolic function. The mitral bioprosthetic valve and the aortic bioprosthetic valve were both functioning normally.   Patient was last seen by Dr. Lavonne Prairie on 10/20/23. At that time, patient was overall doing well. Patient had been off anticoagulation due to history of life-threatening serious bleeding in the past. He was set up for a yearly follow up appointment in 12/2024   Patient was seen in the ED on 03/17/24 complaining of shortness of breath, weight gain of 4 lbs, lower extremity edema. Had been taking torsemide 10 mg BID as prescribed. hsTn negative x2. K 4.3, creatinine 1.39, WBC 7.8, hemoglobin 12.8. COVID, flu, RSV negative.  CXR showed new small bilateral pleural effusions and bibasilar atelectasis, increased diffuse interstitial prominence suspicious for mild interstitial edema. Her Torsemide was increased to 20 mg BID and she was discharged from the ED   Patricia [name] presents today for an ED follow up appointment. She was seen in the ED on 4/6 with shortness of breath and lower extremity swelling and her torsemide dose was increased. Since her torsemide dose was increased in the ED, she reports feeling much better. Breathing has returned to normal and she has not had any lower extremity swelling. Weight is down around 11 lbs. She does note feeling a bit weak at times and has had occasional dizziness upon standing. Has a history of afib. Denies any palpitations or fast HR at home. Wears an apple watch and has not had any afib or tachycardia alerts. She is not on blood thinners due to past bleeding issues. She experienced an episode of electrical shock around her pacemaker area about three weeks ago, which lasted for a few seconds and has not recurred.  She has been eating foods rich in potassium since her Torsemide was increased and she was not given a potassium supplement.  ROS: per HPI   Studies Reviewed: .    Cardiac Studies & Procedures    ______________________________________________________________________________________________ CARDIAC CATHETERIZATION  CARDIAC CATHETERIZATION 06/27/2017  Conclusion  There is moderate (3+) mitral regurgitation.  1. No significant coronary disease. 2. Normal LV systolic function. 3. 3+ mitral regurgitation. 4. Severe mitral stenosis. 5. No more than mild aortic stenosis. 6. Mildly elevated PCWP with near-normal LVEDP. 7. 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 8. Root shot not done to assess aortic insufficiency, will have TEE on Thursday.  Findings Coronary Findings Diagnostic  Dominance: Right  Left Main Short, no angiographic disease.  Left Anterior Descending No angiographic CAD.  Left Circumflex Mild luminal irregularities.  Right Coronary Artery No angiographic CAD.  Intervention  No interventions have been documented.     ECHOCARDIOGRAM  ECHOCARDIOGRAM COMPLETE 05/30/2023  Narrative ECHOCARDIOGRAM REPORT    Patient Name:   Patricia [name] Date of Exam: 05/30/2023 Medical Rec #:  [id number]        Height:       66.0 in Accession #:    [id number]       Weight:       104.8 lb Date of Birth:  1951/11/23       BSA:          1.520 m Patient Age:    71 years         BP:           124/72 mmHg Patient Gender: F                HR:           93 bpm. Exam Location:  [address]  Procedure: 2D Echo, Cardiac Doppler, Color Doppler and Intracardiac Opacification Agent  Indications:    Z86.79 Pulmonic Valve Disease  History:        Patient has prior history of Echocardiogram examinations, most recent 05/26/2022. AVR-15mm Inspiris Resilia. MVR-89mm Medtronic Mosaic Bioprosthetic Tissue Valve and Pacemaker, COPD, Arrythmias:Atrial Fibrillation; Risk  Factors:Hypertension. Aortic Valve: 19 mm Inspiris Resilia valve is present in the aortic position. Mitral Valve: 25 mm Medtronic bioprosthetic valve valve is present in the mitral position.  Sonographer:    Cheri Coria Rodgers-Jones RDCS Referring Phys: [id number] JAMES HOCHREIN  IMPRESSIONS   1. Left ventricular ejection fraction, by estimation, is 50 to 55%. The left ventricle has low normal function. The left ventricle demonstrates regional wall motion abnormalities. Septal hypokinesis. Left ventricular diastolic parameters are indeterminate. 2. Right ventricular systolic function is mildly reduced. The right ventricular size is mildly enlarged. There is mildly elevated pulmonary artery systolic pressure. The estimated right ventricular systolic pressure is 37.1 mmHg. 3. The mitral valve has been repaired/replaced. No evidence of mitral valve regurgitation. The mean mitral valve gradient is 7.0 mmHg with average heart rate of 93 bpm, stable from prior echo. There is a 25 mm Medtronic bioprosthetic valve present in the mitral position. 4. The tricuspid valve is abnormal. Tricuspid valve regurgitation is moderate to severe. 5. The aortic valve has been repaired/replaced. Aortic valve regurgitation is not visualized. There is a 19 mm Inspiris Resilia valve present in the aortic position. Echo findings are consistent with normal structure and function of the aortic valve prosthesis. 6. Pulmonic valve  regurgitation is moderate. 7. The inferior vena cava is normal in size with greater than 50% respiratory variability, suggesting right atrial pressure of 3 mmHg.  FINDINGS Left Ventricle: Left ventricular ejection fraction, by estimation, is 50 to 55%. The left ventricle has low normal function. The left ventricle demonstrates regional wall motion abnormalities. Definity contrast agent was given IV to delineate the left ventricular endocardial borders. The left ventricular internal cavity size was small.  There is no left ventricular hypertrophy. Left ventricular diastolic parameters are indeterminate.  Right Ventricle: The right ventricular size is mildly enlarged. No increase in right ventricular wall thickness. Right ventricular systolic function is mildly reduced. There is mildly elevated pulmonary artery systolic pressure. The tricuspid regurgitant velocity is 2.92 m/s, and with an assumed right atrial pressure of 3 mmHg, the estimated right ventricular systolic pressure is 37.1 mmHg.  Left Atrium: Left atrial size was normal in size.  Right Atrium: Right atrial size was normal in size.  Pericardium: There is no evidence of pericardial effusion.  Mitral Valve: The mitral valve has been repaired/replaced. No evidence of mitral valve regurgitation. There is a 25 mm Medtronic bioprosthetic valve present in the mitral position. MV peak gradient, 15.3 mmHg. The mean mitral valve gradient is 7.0 mmHg with average heart rate of 93 bpm.  Tricuspid Valve: The tricuspid valve is abnormal. Tricuspid valve regurgitation is moderate to severe.  Aortic Valve: The aortic valve has been repaired/replaced. Aortic valve regurgitation is not visualized. Aortic valve mean gradient measures 4.6 mmHg. Aortic valve peak gradient measures 7.8 mmHg. There is a 19 mm Inspiris Resilia valve present in the aortic position. Echo findings are consistent with normal structure and function of the aortic valve prosthesis.  Pulmonic Valve: The pulmonic valve was not well visualized. Pulmonic valve regurgitation is moderate.  Aorta: The aortic root is normal in size and structure.  Venous: The inferior vena cava is normal in size with greater than 50% respiratory variability, suggesting right atrial pressure of 3 mmHg.  IAS/Shunts: The interatrial septum was not well visualized.   LEFT VENTRICLE PLAX 2D LVIDd:         3.50 cm Diastology LVIDs:         2.20 cm LV e' medial:    8.67 cm/s LV PW:         0.90 cm LV  E/e' medial:  21.4 LV IVS:        0.80 cm LV e' lateral:   5.98 cm/s LV E/e' lateral: 31.0   RIGHT VENTRICLE            IVC RV Basal diam:  4.10 cm    IVC diam: 1.80 cm RV S prime:     5.30 cm/s TAPSE (M-mode): 1.0 cm  LEFT ATRIUM             Index        RIGHT ATRIUM          Index LA diam:        3.70 cm 2.43 cm/m   RA Area:     6.02 cm LA Vol (A2C):   18.3 ml 12.04 ml/m  RA Volume:   11.20 ml 7.37 ml/m LA Vol (A4C):   20.3 ml 13.36 ml/m LA Biplane Vol: 19.9 ml 13.10 ml/m AORTIC VALVE                   PULMONIC VALVE AV Vmax:           140.00 cm/s PV  Vmax:        0.86 m/s AV Vmean:          99.340 cm/s PV Vmean:       64.460 cm/s AV VTI:            0.247 m     PV VTI:         0.157 m AV Peak Grad:      7.8 mmHg    PV Peak grad:   2.9 mmHg AV Mean Grad:      4.6 mmHg    PV Mean grad:   2.0 mmHg LVOT Vmax:         49.70 cm/s  RVOT Peak grad: 1 mmHg LVOT Vmean:        33.100 cm/s LVOT VTI:          0.082 m LVOT/AV VTI ratio: 0.33  AORTA Ao Root diam: 2.60 cm  MITRAL VALVE                TRICUSPID VALVE MV Area (PHT): 2.86 cm     TR Peak grad:   34.1 mmHg MV Peak grad:  15.3 mmHg    TR Vmax:        292.00 cm/s MV Mean grad:  7.0 mmHg MV Vmax:       1.96 m/s     SHUNTS MV Vmean:      115.5 cm/s   Systemic VTI: 0.08 m MV E velocity: 185.67 cm/s  Pulmonic VTI: 0.093 m  Carson Clara MD Electronically signed by Carson Clara MD Signature Date/Time: 05/30/2023/12:26:28 PM    Final   TEE  ECHO INTRAOPERATIVE TEE 04/07/2022  Narrative *INTRAOPERATIVE TRANSESOPHAGEAL REPORT *    Patient Name:   ARACELLY TENCZA Harvell Date of Exam: 04/07/2022 Medical Rec #:  [id number]        Height:       66.0 in Accession #:    [id number]       Weight:       114.0 lb Date of Birth:  Nov 15, 1951       BSA:          1.57 m Patient Age:    70 years         BP:           151/76 mmHg Patient Gender: F                HR:           90 bpm. Exam Location:   Anesthesiology  Transesophogeal exam was perform intraoperatively during surgical procedure. Patient was closely monitored under general anesthesia during the entirety of examination.  Indications:     Severe aortic insufficiency [I35.1 (ICD-10-CM)] Performing Phys: Arlyne Lame MD Diagnosing Phys: Arlyne Lame MD  Complications: No known complications during this procedure. POST-OP IMPRESSIONS _ Left Ventricle: LVEF unchanged, WMA stable. _ Right Ventricle: The right ventricle appears unchanged from pre-bypass. _ Aorta: Aorta stable, no dissection noted after cannula removed. _ Left Atrium: The left atrium appears unchanged from pre-bypass. _ Left Atrial Appendage: Not assessed post-bypass. _ Aortic Valve: 19mm Inspiris prosthetic valve, no rocking, no perivalvular leaks. No regurgitant flow. No stenosis. _ Mitral Valve: The mitral valve appears unchanged from pre-bypass. _ Tricuspid Valve: The tricuspid valve appears unchanged from pre-bypass. _ Pulmonic Valve: No assessed post-bypass. _ Interatrial Septum: The interatrial septum appears unchanged from pre-bypass. _ Interventricular Septum: The interventricular septum appears unchanged from pre-bypass. _ Pericardium: The pericardium appears unchanged from  pre-bypass.  PRE-OP FINDINGS Left Ventricle: The left ventricle has normal systolic function, with an ejection fraction of 60-65%. The cavity size was normal. There is no left ventricular hypertrophy. Left ventricular diastolic function could not be evaluated.   LV Wall Scoring: The entire septum is hypokinetic.   Right Ventricle: The right ventricle has normal systolic function. The cavity was normal. There is no increase in right ventricular wall thickness. Right ventricular systolic pressure is normal. There is no aneurysm seen.  Left Atrium: Left atrial size was normal in size. No left atrial/left atrial appendage thrombus was detected. The left atrial appendage is well  visualized and there is no evidence of thrombus present.  Right Atrium: Right atrial size was normal in size. Pacing wires present in the right atrium.  Interatrial Septum: No atrial level shunt detected by color flow Doppler. There is no evidence of a patent foramen ovale.  Pericardium: There is no evidence of pericardial effusion. There is no pleural effusion.  Mitral Valve: The mitral valve has been repaired/replaced with 25mm Medtronic Mosaic prosthetic valve. Mitral valve regurgitation is not visualized by color flow Doppler. There is no evidence of mitral stenosis. No perivalvular leaks noted. Normal leaflet motion. No mitral valve vegetations.  Tricuspid Valve: The tricuspid valve appears normal in structure. Tricuspid valve regurgitation is trivial by color flow Doppler. Unable to assess complete valve thoroughly secondary to acoustic shadows.  Aortic Valve: The aortic valve is tricuspid aortic valve regurgitation is moderate to severe by color flow Doppler with PHT 332 ms.. Left coronary leaflet and non-coronary are mobile with impaired coaptation. Right coronary leaftlet is immobile. The jet is centrally-directed. There is no stenosis of the aortic valve, mean gradient 8 mmHg. There is no evidence of aortic valve vegetation but mild calcification and thickening noted.   Pulmonic Valve: The pulmonic valve was normal in structure No evidence of pumonic stenosis. Pulmonic valve regurgitation is trivial by color flow Doppler.   Aorta: The aortic root and ascending aorta are normal in size and structure. There is evidence of protruding, non-mobile plaque in the descending aorta and aortic arch; Grade III, measuring 3-20mm in size.  Pulmonary Artery: The pulmonary artery is of normal size.  Shunts: There is no evidence of an atrial septal defect.   Arlyne Lame MD Electronically signed by Arlyne Lame MD Signature Date/Time: 04/07/2022/2:38:29 PM    Final    CT SCANS  CT  CORONARY MORPH W/CTA COR W/SCORE 04/05/2022  Addendum 04/05/2022  3:22 PM ADDENDUM REPORT: 04/05/2022 15:19  CLINICAL DATA:  70 pre op aortic valve replacement.  EXAM: Cardiac/Coronary  CTA  TECHNIQUE: The patient was scanned on a [employer].  FINDINGS: A 120 kV prospective scan was triggered in the descending thoracic aorta at 111 HU's. Axial non-contrast 3 mm slices were carried out through the heart. The data set was analyzed on a dedicated work station and scored using the Agatson method. Gantry rotation speed was 250 msecs and collimation was .6 mm. 0.8 mg of sl NTG was given. The 3D data set was reconstructed in 5% intervals of the 67-82 % of the R-R cycle. Diastolic phases were analyzed on a dedicated work station using MPR, MIP and VRT modes. The patient received 80 cc of contrast.  Image quality: good  Aorta:  Normal size.  No calcifications.  No dissection.  Aortic Valve:  Trileaflet.  No calcifications.  Coronary Arteries:  Normal coronary origin.  Right dominance.  RCA is a large dominant artery  that gives rise to PDA and PLA. There is no plaque.  Left main is a very short large artery that gives rise to LAD and LCX arteries.  LAD is a large vessel that has mid calcified plaque after first diagonal 25-49%.  LCX is a non-dominant artery that gives rise to one large OM1 branch. There is no plaque.  Other findings:  Normal pulmonary vein drainage into the left atrium.  Normal left atrial appendage without a thrombus.  Normal size of the pulmonary artery.  Pacer leads in atrium and right ventricle.  Post mitral valve replacement  Please see radiology report for non cardiac findings.  IMPRESSION: 1. Coronary calcium score of 23 (LAD). This was 67 percentile for age and sex matched control.  2. Normal coronary origin with right dominance.  3. Mid calcified LAD plaque after first diagonal 25-49%. Non flow limiting. No circumflex disease  identified.  4. Pacemaker leads, mitral valve repair.   Electronically Signed By: Dorothye Gathers M.D. On: 04/05/2022 15:19  Narrative EXAM: OVER-READ INTERPRETATION  CT CHEST  The following report is an over-read performed by radiologist Dr. Deboraha Fallow of [facility] on 04/05/2022. This over-read does not include interpretation of cardiac or coronary anatomy or pathology. The coronary calcium score/coronary CTA interpretation by the cardiologist is attached.  COMPARISON:  None.  FINDINGS: Limited view of the lung parenchyma demonstrates small bilateral pleural effusions. Airways are normal.  Limited view of the mediastinum demonstrates no adenopathy. Esophagus normal.  Limited view of the upper abdomen unremarkable.  Limited view of the skeleton and chest wall demonstrates post LEFT mastectomy anatomy. Healed posterior LEFT rib fractures. Healed RIGHT lateral rib fractures.  IMPRESSION: Small bilateral pleural effusions.  Electronically Signed: By: Deboraha Fallow M.D. On: 04/05/2022 14:08   CT SCANS  CT CORONARY FRACTIONAL FLOW RESERVE DATA PREP 08/03/2017  Addendum 09/28/2017  3:07 PM ADDENDUM REPORT: 08/11/2017 18:24  CLINICAL DATA:  74 year old female with primary mitral regurgitation with mild to moderate aortic insufficiency. Preoperative planning.  EXAM: Cardiac TAVR CT  TECHNIQUE: The patient was scanned on a [employer] 256 scanner. A 120 kV retrospective scan was triggered in the descending thoracic aorta at 111 HU's. Gantry rotation speed was 270 msecs and collimation was .9 mm. No beta blockade or nitro were given. The 3D data set was reconstructed in 5% intervals of the R-R cycle. Systolic and diastolic phases were analyzed on a dedicated work station using MPR, MIP and VRT modes. The patient received 80 cc of contrast.  FINDINGS: Aortic Valve:  Trileaflet, mild thickening, no calcifications.  Aorta:  Normal size, mild diffuse  calcifications.  No dissection.  Sinotubular Junction:  23 x 22 mm  Ascending Thoracic Aorta:  23 x 22 mm  Aortic Arch:  20 x 19 mm  Descending Thoracic Aorta:  16 x 16 mm  Sinus of Valsalva Measurements:  Non-coronary:  26 mm  Right -coronary:  26 mm  Left -coronary:  28 mm  Coronary Artery Height above Annulus:  Left Main:  12 mm  Right Coronary:  15 mm  Virtual Basal Annulus Measurements:  Maximum/Minimum Diameter:  24 x 17 mm  Perimeter:  64 mm  Area:  312 mm2  Coronary Arteries: Normal origin, right dominance. Mild non-obstructive plaque.  Normal size of the pulmonary artery.  Normal pulmonary vein drainage into the left atrium.  No left atrial appendage thrombus.  Mitral annular calcifications.  IMPRESSION: 1.Trileaflet aortic valve with mild thickening and no calcifications.  2.  Thoracic aorta has normal size with mild diffuse calcifications and dissection.  3. No left atrial appendage thrombus.  4. Mitral annular calcifications.  Tobias Alexander   Electronically Signed By: Tobias Alexander On: 08/11/2017 18:24  Narrative EXAM: OVER-READ INTERPRETATION  CT CHEST  The following report is an over-read performed by radiologist Dr. Royal Piedra [facility] on 08/07/2017. This over-read does not include interpretation of cardiac or coronary anatomy or pathology. The coronary calcium score/coronary CTA interpretation by the cardiologist is attached.  COMPARISON:  None.  FINDINGS: Atherosclerotic plaque in the thoracic aorta. Within the visualized portions of the thorax there are no suspicious appearing pulmonary nodules or masses, there is no acute consolidative airspace disease, no pleural effusions, no pneumothorax and no lymphadenopathy. Several borderline enlarged middle mediastinal lymph nodes are noted measuring up to 9 mm in short axis (nonspecific). Postoperative changes of left-sided modified radical mastectomy  are noted. 1.4 cm hypervascular lesion demonstrating peripheral nodular enhancement in segment 8 of the liver is unchanged compared to prior CT the abdomen and pelvis 03/27/2015, compatible with a small cavernous hemangioma. 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 There are no aggressive appearing lytic or blastic lesions noted in the visualized portions of the skeleton.  IMPRESSION: 1. Aortic atherosclerosis.  Aortic Atherosclerosis (ICD10-I70.0).  Electronically Signed: By: Trudie Reed M.D. On: 08/07/2017 09:44   CT CORONARY MORPH W/CTA COR W/SCORE 08/03/2017  Addendum 08/11/2017  6:27 PM ADDENDUM REPORT: 08/11/2017 18:24  CLINICAL DATA:  73 year old female with primary mitral regurgitation with mild to moderate aortic insufficiency. Preoperative planning.  EXAM: Cardiac TAVR CT  TECHNIQUE: The patient was scanned on a [employer] 256 scanner. A 120 kV retrospective scan was triggered in the descending thoracic aorta at 111 HU's. Gantry rotation speed was 270 msecs and collimation was .9 mm. No beta blockade or nitro were given. The 3D data set was reconstructed in 5% intervals of the R-R cycle. Systolic and diastolic phases were analyzed on a dedicated work station using MPR, MIP and VRT modes. The patient received 80 cc of contrast.  FINDINGS: Aortic Valve:  Trileaflet, mild thickening, no calcifications.  Aorta:  Normal size, mild diffuse calcifications.  No dissection.  Sinotubular Junction:  23 x 22 mm  Ascending Thoracic Aorta:  23 x 22 mm  Aortic Arch:  20 x 19 mm  Descending Thoracic Aorta:  16 x 16 mm  Sinus of Valsalva Measurements:  Non-coronary:  26 mm  Right -coronary:  26 mm  Left -coronary:  28 mm  Coronary Artery Height above Annulus:  Left Main:  12 mm  Right Coronary:  15 mm  Virtual Basal Annulus Measurements:  Maximum/Minimum Diameter:  24 x 17 mm  Perimeter:  64  mm  Area:  312 mm2  Coronary Arteries: Normal origin, right dominance. Mild non-obstructive plaque.  Normal size of the pulmonary artery.  Normal pulmonary vein drainage into the left atrium.  No left atrial appendage thrombus.  Mitral annular calcifications.  IMPRESSION: 1.Trileaflet aortic valve with mild thickening and no calcifications.  2. Thoracic aorta has normal size with mild diffuse calcifications and dissection.  3. No left atrial appendage thrombus.  4. Mitral annular calcifications.  Tobias Alexander   Electronically Signed By: Tobias Alexander On: 08/11/2017 18:24  Narrative EXAM: OVER-READ INTERPRETATION  CT CHEST  The following report is an over-read performed by radiologist Dr. Royal Piedra [facility] on 08/07/2017. This over-read does not  include interpretation of cardiac or coronary anatomy or pathology. The coronary calcium score/coronary CTA interpretation by the cardiologist is attached.  COMPARISON:  None.  FINDINGS: Atherosclerotic plaque in the thoracic aorta. Within the visualized portions of the thorax there are no suspicious appearing pulmonary nodules or masses, there is no acute consolidative airspace disease, no pleural effusions, no pneumothorax and no lymphadenopathy. Several borderline enlarged middle mediastinal lymph nodes are noted measuring up to 9 mm in short axis (nonspecific). Postoperative changes of left-sided modified radical mastectomy are noted. 1.4 cm hypervascular lesion demonstrating peripheral nodular enhancement in segment 8 of the liver is unchanged compared to prior CT the abdomen and pelvis 03/27/2015, compatible with a small cavernous hemangioma. 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 There are no aggressive appearing lytic or blastic lesions noted in the visualized portions of the skeleton.  IMPRESSION: 1. Aortic  atherosclerosis.  Aortic Atherosclerosis (ICD10-I70.0).  Electronically Signed: By: Alexandria Angel M.D. On: 08/07/2017 09:44     ______________________________________________________________________________________________       Risk Assessment/Calculations:    CHA2DS2-VASc Score = 4  This indicates a 4.8% annual risk of stroke. The patient's score is based upon: CHF History: 1 HTN History: 0 Diabetes History: 0 Stroke History: 0 Vascular Disease History: 1 Age Score: 1 Gender Score: 1            Physical Exam:   VS:  BP 128/66 (BP Location: Right Arm, Patient Position: Sitting, Cuff Size: Normal)   Pulse 88   Ht 5\' 6"  (1.676 m)   Wt 105 lb 6.4 oz (47.8 kg)   SpO2 97%   BMI 17.01 kg/m    Wt Readings from Last 3 Encounters:  03/26/24 105 lb 6.4 oz (47.8 kg)  10/20/23 108 lb 9.6 oz (49.3 kg)  08/22/23 104 lb (47.2 kg)    GEN: Well nourished, well developed in no acute distress. Sitting comfortably on the exam table  NECK: No JVD  CARDIAC:  RRR, no murmurs, rubs, gallops. Radial pulses 2+ bilaterally  RESPIRATORY:  Clear to auscultation without rales, wheezing or rhonchi. Normal WOB on room air  ABDOMEN: Soft, non-tender, non-distended EXTREMITIES:  No edema in BLE; No deformity   ASSESSMENT AND PLAN: .    Chronic HFpEF  - Echocardiogram from 05/2023 showed EF 50-55%, mildly reduced RV systolic function, mildly elevated PA systolic pressure - Patient was seen in the ED on 4/6 complaining of shortness of breath, weight gain, lower extremity swelling. hsTn negative x2. Creatinine 1.39 CXR with evidence of pulmonary edema. Torsemide was increased to 20 mg BID  - Patient reports significant improvement in symptoms on increase torsemide dose. Breathing is back to normal, no lower extremity swelling. Reports occasionally feeling weak and has some dizziness upon standing, no syncope or near syncope. BP stable  - Patient is euvolemic on exam today  - Decrease torsemide  to 20 mg in the AM, 10 mg in PM  - Ordered BMP to reassess kidney function and K after torsemide was increased   S/p AVR S/p MVR  - Patient previously underwent MVR in 09/2017 with a 25 mm Medtronic Mosaic porcine valve. Later underwent AVR  in 03/2022 with a bioprosthetic valve - Echocardiogram from 05/2023 showed normal function of both the aortic valve and mitral valve   Moderate-Severe TR  Moderate Pulmonic regurgitation  - Noted on echocardiogram from 05/2023  - Euvolemic on exam today  - Decrease torsemide to 20 mg  every AM and 10 mg every PM   History of Atrial flutter, s/p Ablation  - Previously underwent ablation in the 1990s - No afib/flutter noted on recent device checks  - Denies palpitations. EKG in the ED showed NSR  - Not on anticoagulation due to history of serious bleeding  - Continue metoprolol tartrate 100 mg BID   CHB s/p PPM  - Followed by Dr. Carolynne Citron  - Most recent device check from 12/2023 showed normal device function  - Device check is scheduled for later today    Dispo: Follow up in 3 months with Dr. Carolynne Citron, 9 months with Dr. Lavonne Prairie   Signed, Debria Fang, PA-C

## 2024-03-26 ENCOUNTER — Encounter: Payer: Self-pay | Admitting: Cardiology

## 2024-03-26 ENCOUNTER — Ambulatory Visit: Attending: Cardiology | Admitting: Cardiology

## 2024-03-26 ENCOUNTER — Ambulatory Visit: Payer: Medicare PPO

## 2024-03-26 VITALS — BP 128/66 | HR 88 | Ht 66.0 in | Wt 105.4 lb

## 2024-03-26 DIAGNOSIS — Z952 Presence of prosthetic heart valve: Secondary | ICD-10-CM | POA: Diagnosis not present

## 2024-03-26 DIAGNOSIS — I379 Nonrheumatic pulmonary valve disorder, unspecified: Secondary | ICD-10-CM

## 2024-03-26 DIAGNOSIS — I5032 Chronic diastolic (congestive) heart failure: Secondary | ICD-10-CM

## 2024-03-26 DIAGNOSIS — Z950 Presence of cardiac pacemaker: Secondary | ICD-10-CM | POA: Diagnosis not present

## 2024-03-26 DIAGNOSIS — I272 Pulmonary hypertension, unspecified: Secondary | ICD-10-CM | POA: Diagnosis not present

## 2024-03-26 DIAGNOSIS — I361 Nonrheumatic tricuspid (valve) insufficiency: Secondary | ICD-10-CM | POA: Diagnosis not present

## 2024-03-26 DIAGNOSIS — I4892 Unspecified atrial flutter: Secondary | ICD-10-CM

## 2024-03-26 DIAGNOSIS — I442 Atrioventricular block, complete: Secondary | ICD-10-CM

## 2024-03-26 LAB — BASIC METABOLIC PANEL WITH GFR
BUN/Creatinine Ratio: 23 (ref 12–28)
BUN: 33 mg/dL — ABNORMAL HIGH (ref 8–27)
CO2: 30 mmol/L — ABNORMAL HIGH (ref 20–29)
Calcium: 10.6 mg/dL — ABNORMAL HIGH (ref 8.7–10.3)
Chloride: 91 mmol/L — ABNORMAL LOW (ref 96–106)
Creatinine, Ser: 1.43 mg/dL — ABNORMAL HIGH (ref 0.57–1.00)
Glucose: 109 mg/dL — ABNORMAL HIGH (ref 70–99)
Potassium: 5.4 mmol/L — ABNORMAL HIGH (ref 3.5–5.2)
Sodium: 138 mmol/L (ref 134–144)
eGFR: 39 mL/min/{1.73_m2} — ABNORMAL LOW (ref >59)

## 2024-03-26 MED ORDER — TORSEMIDE 10 MG PO TABS: ORAL_TABLET | ORAL | 3 refills | Status: AC

## 2024-03-26 NOTE — Patient Instructions (Addendum)
 Medication Instructions:  Decrease Torsemide to 20 mg (2 tablets) in the morning and 10 mg (1 tablet) in the afternoon *If you need a refill on your cardiac medications before your next appointment, please call your pharmacy*  Lab Work: Today we are going to draw a Bmet If you have labs (blood work) drawn today and your tests are completely normal, you will receive your results only by: MyChart Message (if you have MyChart) OR A paper copy in the mail If you have any lab test that is abnormal or we need to change your treatment, we will call you to review the results.  Testing/Procedures: No testing  Follow-Up: At [facility], you and your health needs are our priority.  As part of our continuing mission to provide you with exceptional heart care, our providers are all part of one team.  This team includes your primary Cardiologist (physician) and Advanced Practice Providers or APPs (Physician Assistants and Nurse Practitioners) who all work together to provide you with the care you need, when you need it.  Your next appointment:   3 month(s)  Provider:   Manya Sells, MD    We recommend signing up for the patient portal called "MyChart".  Sign up information is provided on this After Visit Summary.  MyChart is used to connect with patients for Virtual Visits (Telemedicine).  Patients are able to view lab/test results, encounter notes, upcoming appointments, etc.  Non-urgent messages can be sent to your provider as well.   To learn more about what you can do with MyChart, go to [email or web address].   Other Instructions:   1st Floor: - Lobby - Registration  - Pharmacy  - Lab - Cafe  2nd Floor: - PV Lab - Diagnostic Testing (echo, CT, nuclear med)  3rd Floor: - Vacant  4th Floor: - TCTS (cardiothoracic surgery) - AFib Clinic - Structural Heart Clinic - Vascular Surgery  - Vascular Ultrasound  5th Floor: - HeartCare Cardiology (general and EP) - Clinical  Pharmacy for coumadin, hypertension, lipid, weight-loss medications, and med management appointments    Valet parking services will be available as well.

## 2024-03-27 LAB — CUP PACEART REMOTE DEVICE CHECK
Battery Remaining Longevity: 37 mo
Battery Voltage: 2.93 V
Brady Statistic AP VP Percent: 5.03 %
Brady Statistic AP VS Percent: 0 %
Brady Statistic AS VP Percent: 94.97 %
Brady Statistic AS VS Percent: 0 %
Brady Statistic RA Percent Paced: 5.01 %
Brady Statistic RV Percent Paced: 100 %
Date Time Interrogation Session: 20250415010553
Implantable Lead Connection Status: 753985
Implantable Lead Connection Status: 753985
Implantable Lead Implant Date: 20181009
Implantable Lead Implant Date: 20181009
Implantable Lead Location: 753859
Implantable Lead Location: 753860
Implantable Lead Model: 5076
Implantable Lead Model: 5076
Implantable Pulse Generator Implant Date: 20181009
Lead Channel Impedance Value: 266 Ohm
Lead Channel Impedance Value: 304 Ohm
Lead Channel Impedance Value: 342 Ohm
Lead Channel Impedance Value: 418 Ohm
Lead Channel Pacing Threshold Amplitude: 0.75 V
Lead Channel Pacing Threshold Amplitude: 0.875 V
Lead Channel Pacing Threshold Pulse Width: 0.4 ms
Lead Channel Pacing Threshold Pulse Width: 0.4 ms
Lead Channel Sensing Intrinsic Amplitude: 2.375 mV
Lead Channel Sensing Intrinsic Amplitude: 2.375 mV
Lead Channel Sensing Intrinsic Amplitude: 4.375 mV
Lead Channel Sensing Intrinsic Amplitude: 4.375 mV
Lead Channel Setting Pacing Amplitude: 1.75 V
Lead Channel Setting Pacing Amplitude: 2.5 V
Lead Channel Setting Pacing Pulse Width: 0.4 ms
Lead Channel Setting Sensing Sensitivity: 2 mV
Zone Setting Status: 755011
Zone Setting Status: 755011

## 2024-03-28 ENCOUNTER — Encounter: Payer: Self-pay | Admitting: Internal Medicine

## 2024-03-28 DIAGNOSIS — R490 Dysphonia: Secondary | ICD-10-CM | POA: Diagnosis not present

## 2024-03-28 DIAGNOSIS — J3801 Paralysis of vocal cords and larynx, unilateral: Secondary | ICD-10-CM | POA: Diagnosis not present

## 2024-03-28 DIAGNOSIS — J383 Other diseases of vocal cords: Secondary | ICD-10-CM | POA: Diagnosis not present

## 2024-03-29 ENCOUNTER — Telehealth: Payer: Self-pay

## 2024-03-29 DIAGNOSIS — I442 Atrioventricular block, complete: Secondary | ICD-10-CM

## 2024-03-29 DIAGNOSIS — I272 Pulmonary hypertension, unspecified: Secondary | ICD-10-CM

## 2024-03-29 NOTE — Telephone Encounter (Signed)
-----   Message from Debria Fang sent at 03/27/2024 12:14 PM EDT ----- Please tell patient that her lab work showed that her potassium was actually a bit high at 5.4. Please tell her to cut back on her dietary potassium.Calcium  is also mildly elevated. Recheck BMP in 2 weeks   Otherwise, kidney function is stable after her torsemide  was increased.

## 2024-03-29 NOTE — Telephone Encounter (Signed)
 Called patient advised of below they verbalized understanding Ordered labs

## 2024-04-02 ENCOUNTER — Other Ambulatory Visit: Payer: Self-pay | Admitting: Family Medicine

## 2024-04-03 ENCOUNTER — Telehealth: Payer: Self-pay

## 2024-04-03 NOTE — Telephone Encounter (Signed)
 Call to patient- to schedule appointment- she is not at home- but will call and schedule when she returns. Courtesy RF granted Requested Prescriptions  Pending Prescriptions Disp Refills   fluticasone  (FLONASE ) 50 MCG/ACT nasal spray [Pharmacy Med Name: FLUTICASONE  PROPIONATE SUSPENSION] 16 mL 0    Sig: PLACE TWO SPRAYS INTO EACH NOSTRIL DAILY     Ear, Nose, and Throat: Nasal Preparations - Corticosteroids Passed - 04/03/2024 12:07 PM      Passed - Valid encounter within last 12 months    Recent Outpatient Visits           11 months ago Aspiration into airway, initial encounter   [facility] Austine Lefort, MD   1 year ago Viral upper respiratory tract infection   [place] [facility] Austine Lefort, MD   1 year ago Acute pulmonary edema [facility])   [place] [facility] Pickard, Cisco Crest, MD       Future Appointments             In 11 months Elta Halter, MD [facility]

## 2024-04-03 NOTE — Telephone Encounter (Signed)
 Copied from CRM [id number]. Topic: Appointments - Scheduling Inquiry for Clinic >> Apr 03, 2024  1:09 PM DeAngela L wrote: Reason for CRM: Pt wants schedule a physical her last 04/11/23 epic physical calendar shows Jan 2026, and Patient would like to schedule for 2025 Patient call back number [phone] (M)

## 2024-04-13 ENCOUNTER — Other Ambulatory Visit: Payer: Self-pay | Admitting: Family Medicine

## 2024-04-15 DIAGNOSIS — J3801 Paralysis of vocal cords and larynx, unilateral: Secondary | ICD-10-CM | POA: Diagnosis not present

## 2024-04-15 DIAGNOSIS — R490 Dysphonia: Secondary | ICD-10-CM | POA: Diagnosis not present

## 2024-04-15 DIAGNOSIS — J383 Other diseases of vocal cords: Secondary | ICD-10-CM | POA: Diagnosis not present

## 2024-04-20 ENCOUNTER — Other Ambulatory Visit: Payer: Self-pay | Admitting: Family Medicine

## 2024-04-22 ENCOUNTER — Encounter: Payer: Self-pay | Admitting: Family Medicine

## 2024-04-22 ENCOUNTER — Ambulatory Visit: Admitting: Family Medicine

## 2024-04-22 VITALS — BP 110/70 | HR 112 | Temp 98.6°F | Ht 66.0 in | Wt 106.0 lb

## 2024-04-22 DIAGNOSIS — I5032 Chronic diastolic (congestive) heart failure: Secondary | ICD-10-CM | POA: Diagnosis not present

## 2024-04-22 DIAGNOSIS — I480 Paroxysmal atrial fibrillation: Secondary | ICD-10-CM

## 2024-04-22 DIAGNOSIS — J449 Chronic obstructive pulmonary disease, unspecified: Secondary | ICD-10-CM | POA: Diagnosis not present

## 2024-04-22 DIAGNOSIS — R490 Dysphonia: Secondary | ICD-10-CM

## 2024-04-22 DIAGNOSIS — R000 Tachycardia, unspecified: Secondary | ICD-10-CM | POA: Diagnosis not present

## 2024-04-22 MED ORDER — LEVOFLOXACIN 500 MG PO TABS: 500.0000 mg | ORAL_TABLET | Freq: Every day | ORAL | 0 refills | Status: AC

## 2024-04-22 NOTE — Progress Notes (Addendum)
 Patient Office Visit  Assessment & Plan:  Chronic obstructive pulmonary disease, unspecified COPD type (HCC) -     levoFLOXacin ; Take 1 tablet (500 mg total) by mouth daily for 7 days.  Dispense: 7 tablet; Refill: 0  Paroxysmal atrial fibrillation (HCC)  Tachycardia  Chronic diastolic heart failure (HCC)  Dysphonia   Assessment and Plan    Pneumonia risk Multiple past pneumonia episodes. Current laryngitis and chest congestion. Differential includes pneumonia or heart failure exacerbation. Discussed antibiotic risks. - Prescribed Levaquin  500 mg for potential infection. - Order chest x-ray if no symptom improvement in a few days.  Chronic diastolic heart failure Chronic diastolic heart failure with no current exacerbation signs. Weight well-managed.  Atrial fibrillation Atrial fibrillation with elevated heart rate despite Lopressor . Not on anticoagulation due to bleeding risk. EKG not useful due to pacemaker.  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 Pacemaker functioning as expected.  Bleeding ulcer History of significant bleeding ulcer. Not on anticoagulation due to bleeding risk.  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 Current laryngitis exacerbating symptoms.  Hodgkin's lymphoma and breast cancer History of Hodgkin's lymphoma and breast cancer, currently in remission.  General Health Maintenance Up to date on pneumonia vaccines. Advised to delay COVID booster until recovery. Discussed new pneumonia vaccines for high-risk individuals.  Follow-Up Scheduled follow-up with Dr. Cheril Cork and lab work. Advised to contact if symptoms worsen or persist.         Patient will let us  know if not improving in the next couple of days.  Return if symptoms worsen or fail to improve.   Subjective:     Patient ID: Patricia [name], female    DOB: 1951/04/15  Age: 73 y.o. MRN: [id number]  Chief  Complaint  Patient presents with   Laryngitis    Started last week. No sore throat or nasal congestion. Pt states the congestion is in her chest.    HPI Discussed the use of AI scribe software for clinical note transcription with the patient, who gave verbal consent to proceed.      Patricia [name] is a 73 year old female with congestive heart failure and atrial fibrillation who presents with loss of voice and chest congestion in her chest. No fever or chills, no wheezing or SOB.  She has experienced loss of voice since last Wednesday, with symptoms worsening over time. She has a mild cough at night but denies head congestion, feeling that the congestion is primarily in her chest. A similar episode occurred six weeks ago, leading to congestive heart failure and necessitating an ER visit where she received a breathing treatment and an increased Lasix  dosage. Patient was having SOB and chest pain at that time. Pt is a nonsmoker. Hx of COPD from radiation  Her medical history includes congestive heart failure, atrial fibrillation, and pacemaker dependency. She has undergone two valve replacements and is not currently on a blood thinner due to a past bleeding ulcer that required nine units of blood. Her current medications include Lopressor  100 mg twice daily, but her heart rate has been elevated recently, noted to be high on Thursday, Friday, and Saturday. Patient cannot tell she her Heart rate is elevated. Pt did see cardiology last month with no medication changes.   She is a three-time cancer survivor, having had Hodgkin's disease twice in the 1970s and breast cancer. She has a history of pneumonia, having had it two or three times, and has  received pneumonia vaccines in the past. She is allergic to antibiotics but has done fine with Levaquin , which has been effective for her in the past.  She monitors her weight daily and reports it as stable, adjusting her Lasix  dosage if she gains two or three  pounds. No current shortness of breath, ankle swelling, fever, chills, or wheezing. Her vocal cords were injured during her last aortic valve surgery two years ago, affecting her voice volume and causing hoarseness. Her voice volume has diminished more so the past week. Physical Exam CHEST: Lungs clear to auscultation, no wheezing Results LABS Heart rate: 88 (03/2024)  RADIOLOGY Chest X-ray: Congestive heart failure (03/2024)-reviewed today Assessment & Plan Pneumonia risk Multiple past pneumonia episodes. Current laryngitis and chest congestion. Differential includes pneumonia or heart failure exacerbation. Discussed antibiotic risks. - Prescribed Levaquin  500 mg for potential infection. - Order chest x-ray if no symptom improvement in a few days.  Chronic diastolic heart failure Chronic diastolic heart failure with no current exacerbation signs. Weight well-managed.  Paroxysmal Atrial fibrillation with tachycardia today (her usual baseline is in the 90's) Patient has elevated heart rate despite Lopressor . Not on anticoagulation due to bleeding risk. EKG not useful due to pacemaker. 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 Pacemaker functioning as expected. It was interrogated last month and was fine  Bleeding ulcer History of significant bleeding ulcer. Not on anticoagulation due to bleeding risk.  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 Current laryngitis exacerbating symptoms. Pt states her voice is almost gone the past several days.   Hodgkin's lymphoma and breast cancer History of Hodgkin's lymphoma and breast cancer, currently in remission.  General Health Maintenance Up to date on pneumonia vaccines. Advised to delay COVID booster until recovery. Discussed new pneumonia vaccines for high-risk individuals.  Follow-Up Scheduled follow-up with Dr. Cheril Cork and  lab work. Advised to contact if symptoms worsen or persist.    The ASCVD Risk score (Arnett DK, et al., 2019) failed to calculate for the following reasons:   The valid total cholesterol range is 130 to 320 mg/dL  Past Medical History:  Diagnosis Date   Actinic keratosis    Anemia    Aortic atherosclerosis (HCC)    Aortic insufficiency    a.) s/p AVR 04/07/2022 --> 19 mm Inspiris Resilia valve   Atrial fibrillation and flutter (HCC)    a.) s/p ablation 1998; b.) CHA2DS2-VASc: 4 (age, sex, vascular disease history, T2DM); b.) rate/rhythm maintained on oral metoprolol  tartrate; no chronic OAC   Basal cell carcinoma 06/05/2019   R low back, EDC 07/16/19   Basal cell carcinoma 04/15/2010   Right post shoulder   Basal cell carcinoma 04/15/2010   Mid back   Basal cell carcinoma 07/13/2023   left top of shoulder, EDC 09/26/23   Basal cell carcinoma (BCC) 09/29/2016   Left post shoulder. Superficial and nodular.   BCC (basal cell carcinoma) 07/21/2021   right mid back, [facility] 09/15/2021   BCC (basal cell carcinoma) 01/27/2022   left lower abdomen, ED[facility] 07/28/22   BCC (basal cell carcinoma) 07/28/2022   right lateral forehead, EDC 08/10/22   BCC (basal cell carcinoma) 07/28/2022   left buttock, EDC 08/10/22   Bilateral pleural effusion    Bleeding gastric ulcer 2000   Breast cancer, left (HCC) 2005   a.)  s/p LEFT mastectomy 2005 --> lymph nodes were (+) --> Tx'd with systemic antineoplastic chemotherapy  CAD (coronary artery disease)    a.) cCTA 04/05/2022: Ca2+ 23 (51st %'ile for age/sex/race matched control); 25-49% mLAD calcification   CHB (complete heart block) (HCC)    a.) symptomatic; s/p Medtronic Azure XT DR MRI SureScan PPM placement 09/19/2017   COPD (chronic obstructive pulmonary disease) (HCC)    Diet-controlled type 2 diabetes mellitus (HCC)    Dysphonia    Dyspnea    GERD (gastroesophageal reflux disease)    History of basal cell carcinoma (BCC) 03/22/2021   right neck  and right medial ear, Moh's   History of bilateral cataract extraction    History of blood transfusion    Hodgkin's lymphoma (HCC) 1974   a.) s/p laparotomy for splenectomy and liver Bx in 1974 --> Tx'd with systemic chemotherapy + chest XRT --> recurrence 2 years later requiring additional systemic chemotherapy and XRT   Hypothyroidism    Lower leg edema    Mitral valve stenosis    a.) s/p minimally invasive MVR 09/13/2017 --> 25 mm Medtronic Mosaic porcine stented bioprosthetic tissue valve   NSVT (nonsustained ventricular tachycardia) (HCC)    Osteoporosis    lumbar verterbral fracture, left ankle fracture, dexa 2015   Pancreatitis    Pneumonia    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   a.) Medtronic Azure XT DR MRI SureScan Q6VH84 (SN: [id number])   Pulmonary HTN (HCC)    a.) TTE: 05/14/14: RVSP 65; b.) TTE 08/31/15: RVSP 51, c.) TTE 12/23/16: RVSP 78; d.) RHC 06/27/17: mRA 6, mPA 37, mPCWP 18, AO sat 99, PA sat 67, CO 4.15, CI 2.43, PVR 4.6, AoV peak-peak 12, mitral MPG 11; e.) TTE 11/10/17: RVSP 63, f.) TTE 10/19/18: RVSP 52; g.) TTE 10/09/20: RVSP 42.9; h.) TTE 12/22/21: RVSP 54.7; i.) TTE 05/17/22: 47.7; j.) TTE 05/26/22: RVSP 40.5; k.) TTE 05/30/23: RVSP 37.1   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/07/2022   a.) s/p AVR 04/07/2022 --> 19 mm Inspiris Resilia valve   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 09/13/2017   a.) 25 mm Medtronic Mosaic porcine stented bioprosthetic tissue valve   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   Left medial lower leg   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   right anterior thigh, EDC 08/10/22   Syncope    Tachycardia    Past Surgical History:  Procedure Laterality Date   AORTIC VALVE REPLACEMENT N/A 04/07/2022   Procedure: AORTIC VALVE REPLACEMENT (AVR), USING INSPIRIS 19 MM AORTIC VALVE;  Surgeon: Bartley Lightning, MD;  Location: [facility] OR;  Service: Open Heart Surgery;  Laterality: N/A;    ATRIAL FLUTTER ABLATION N/A 1998   BUBBLE STUDY  01/12/2022   Procedure: BUBBLE STUDY;  Surgeon: Harrold Lincoln, MD;  Location: [facility] ENDOSCOPY;  Service: Cardiovascular;;   CARDIAC ELECTROPHYSIOLOGY MAPPING AND ABLATION  1999   CATARACT EXTRACTION, BILATERAL     COLONOSCOPY     IR RADIOLOGY PERIPHERAL GUIDED IV START  08/03/2017   IR THORACENTESIS ASP PLEURAL SPACE W/IMG GUIDE  05/23/2022   IR THORACENTESIS ASP PLEURAL SPACE W/IMG GUIDE  05/24/2022   IR US  GUIDE VASC ACCESS RIGHT  08/03/2017   LAPAROTOMY N/A 1974   MASTECTOMY Left 2005   MICROLARYNGOSCOPY Bilateral 08/22/2023   Procedure: SUSPENSION MICROLARYNGOSCOPY WITH INJECTION;  Surgeon: Rogers Clayman, MD;  Location: [facility] ORS;  Service: ENT;  Laterality: Bilateral;   MITRAL VALVE REPAIR Right 09/13/2017   Procedure: MINIMALLY INVASIVE MITRAL VALVE REPLACEMENT (MVR) with Medtronic Mosaic Porcine Heart Valve size 25mm;  Surgeon: Gardenia Jump, MD;  Location: [facility] OR;  Service: Open Heart Surgery;  Laterality: Right;   ORIF ANKLE FRACTURE Left 03/21/2014   Procedure: LEFT ANKLE FRACTURE OPEN TREATMENT BILMALLEOLAR INCLUDES INTERNAL FIXATION ;  Surgeon: Saundra Curl, MD;  Location: [facility];  Service: Orthopedics;  Laterality: Left;   PACEMAKER IMPLANT N/A 09/19/2017   Medtronic Azure XT MRI conditional dual-chamber pacemaker for symptomatic complete heart block  by Dr Nunzio Belch   RIGHT/LEFT HEART CATH AND CORONARY ANGIOGRAPHY N/A 06/27/2017   Procedure: Right/Left Heart Cath and Coronary Angiography;  Surgeon: Darlis Eisenmenger, MD;  Location: [facility] INVASIVE CV LAB;  Service: Cardiovascular;  Laterality: N/A;   SPLENECTOMY  1974   hodgkins disease   TEE WITHOUT CARDIOVERSION  06/12/2012   Procedure: TRANSESOPHAGEAL ECHOCARDIOGRAM (TEE);  Surgeon: Darlis Eisenmenger, MD;  Location: [facility] ENDOSCOPY;  Service: Cardiovascular;  Laterality: N/A;   TEE WITHOUT CARDIOVERSION N/A 06/29/2017   Procedure: TRANSESOPHAGEAL  ECHOCARDIOGRAM (TEE);  Surgeon: Hugh Madura, MD;  Location: [facility] ENDOSCOPY;  Service: Cardiovascular;  Laterality: N/A;   TEE WITHOUT CARDIOVERSION N/A 09/13/2017   Procedure: TRANSESOPHAGEAL ECHOCARDIOGRAM (TEE);  Surgeon: Gardenia Jump, MD;  Location: [facility] OR;  Service: Open Heart Surgery;  Laterality: N/A;   TEE WITHOUT CARDIOVERSION N/A 01/12/2022   Procedure: TRANSESOPHAGEAL ECHOCARDIOGRAM (TEE);  Surgeon: Harrold Lincoln, MD;  Location: [facility] ENDOSCOPY;  Service: Cardiovascular;  Laterality: N/A;   TEE WITHOUT CARDIOVERSION N/A 04/07/2022   Procedure: TRANSESOPHAGEAL ECHOCARDIOGRAM (TEE);  Surgeon: Bartley Lightning, MD;  Location: [facility] OR;  Service: Open Heart Surgery;  Laterality: N/A;   TONSILLECTOMY AND ADENOIDECTOMY     Social History   Tobacco Use   Smoking status: Never   Smokeless tobacco: Never  Vaping Use   Vaping status: Never Used  Substance Use Topics   Alcohol use: No   Drug use: No   Family History  Problem Relation Age of Onset   Rheumatic fever Father        Died suddenly age 64   Diabetes Father    Cancer Father        colon   Heart disease Father        rheumatic fever x 3   Arthritis Maternal Grandmother    Hypertension Maternal Grandmother    Diabetes Paternal Grandmother    Vision loss Paternal Grandmother        glaucoma   Allergies  Allergen Reactions   Aspirin  Other (See Comments)    Past bleeding ulcer. GI upset   Azithromycin Other (See Comments)    pancreatitis   Penicillins Anaphylaxis, Hives, Swelling and Other (See Comments)    PATIENT HAS HAD A PCN REACTION WITH IMMEDIATE RASH, FACIAL/TONGUE/THROAT SWELLING, SOB, OR LIGHTHEADEDNESS WITH HYPOTENSION:  #  #  #  YES  #  #  #   HAS PT DEVELOPED SEVERE RASH INVOLVING MUCUS MEMBRANES or SKIN NECROSIS: #  #  #  YES  #  #  #  Has patient had a PCN reaction that required hospitalization: No Has patient had a PCN reaction occurring within the last 10 years: No    Sulfa Antibiotics Hives and  Rash   Cheese Nausea And Vomiting    ROS    Objective:    BP 110/70   Pulse (!) 112   Temp 98.6 F (37 C)   Ht 5\' 6"  (1.676 m)   Wt 106 lb (48.1 kg)   SpO2 99%   BMI 17.11 kg/m  BP Readings from Last 3 Encounters:  04/22/24 110/70  03/26/24 128/66  03/17/24 (!) 154/77   Wt Readings from Last 3 Encounters:  04/22/24 106 lb (48.1 kg)  03/26/24 105 lb 6.4 oz (47.8 kg)  10/20/23 108 lb 9.6 oz (49.3 kg)    Physical Exam Vitals and nursing note reviewed.  Constitutional:      General: She is not in acute distress.    Appearance: Normal appearance.  HENT:     Head: Normocephalic.     Right Ear: Tympanic membrane and ear canal normal.     Left Ear: Tympanic membrane and ear canal normal.  Eyes:     Extraocular Movements: Extraocular movements intact.     Pupils: Pupils are equal, round, and reactive to light.  Cardiovascular:     Rate and Rhythm: Regular rhythm. Tachycardia present.     Heart sounds: Normal heart sounds.  Pulmonary:     Effort: Pulmonary effort is normal.     Breath sounds: Normal breath sounds. No wheezing or rhonchi.  Musculoskeletal:     Right lower leg: No edema.     Left lower leg: No edema.  Neurological:     General: No focal deficit present.     Mental Status: She is alert and oriented to person, place, and time.  Psychiatric:        Mood and Affect: Mood normal.        Behavior: Behavior normal.      No results found for any visits on 04/22/24.

## 2024-04-23 DIAGNOSIS — H52203 Unspecified astigmatism, bilateral: Secondary | ICD-10-CM | POA: Diagnosis not present

## 2024-04-23 DIAGNOSIS — H401132 Primary open-angle glaucoma, bilateral, moderate stage: Secondary | ICD-10-CM | POA: Diagnosis not present

## 2024-04-23 DIAGNOSIS — Z961 Presence of intraocular lens: Secondary | ICD-10-CM | POA: Diagnosis not present

## 2024-04-23 NOTE — Telephone Encounter (Signed)
 Requested Prescriptions  Pending Prescriptions Disp Refills   levothyroxine  (SYNTHROID ) 75 MCG tablet [Pharmacy Med Name: LEVOTHYROXINE  SODIUM 75MCG TABLET] 90 tablet 0    Sig: TAKE ONE TABLET (75 MCG TOTAL) BY MOUTH DAILY.     Endocrinology:  Hypothyroid Agents Passed - 04/23/2024  9:02 AM      Passed - TSH in normal range and within 360 days    TSH  Date Value Ref Range Status  07/10/2023 1.61 0.40 - 4.50 mIU/L Final         Passed - Valid encounter within last 12 months    Recent Outpatient Visits           Yesterday Chronic obstructive pulmonary disease, unspecified COPD type (HCC)   [place] [facility] Amadeo June, MD   1 year ago Aspiration into airway, initial encounter   [place] [facility] Austine Lefort, MD   1 year ago Viral upper respiratory tract infection   [place] [facility] Austine Lefort, MD   1 year ago Acute pulmonary edema [facility])   [place] [facility] Pickard, Cisco Crest, MD       Future Appointments             In 10 months Elta Halter, MD [facility]

## 2024-04-24 ENCOUNTER — Ambulatory Visit (INDEPENDENT_AMBULATORY_CARE_PROVIDER_SITE_OTHER): Payer: Self-pay | Admitting: Dermatology

## 2024-04-24 DIAGNOSIS — I781 Nevus, non-neoplastic: Secondary | ICD-10-CM

## 2024-04-24 NOTE — Patient Instructions (Addendum)
 BEFORE YOUR APPOINTMENT FOR SCLEROTHERAPY  1. When you telephone for your appointment for the sclerotherapy procedure, please let the receptionist know that you are scheduling for the fifteen (15) minute sclerotherapy procedure not just a regular visit.  2. On the day of the procedure, please cleanse and dry the areas, but do not use any moisturizers or other products on the area(s) to be treated.  3. Bring a pair of comfortable shorts to wear during the procedure.  4. Be sure to bring your recommended graduated compression stockings with you to the office. You will be wearing them home when your visit is over. These compression hose can be purchased at most medical supply stores.  After Your Sclerotherapy Procedure  1. Please wear the graduated compression stockings for 24 hours immediately following the completion of the sclerotherapy procedure.  2. We recommend that you avoid vigorous activity as much as possible for the first twenty-four (24) hours. You can do your "normal" routine, but avoid an above normal amount of time on your feet. Elevating the legs when sitting and avoidance of vigorous leg movements or exercise in the first few days after treatment may improve your results.  3. You may remove the compression dressings (cotton balls) and tape the next morning.  4. Please continue wearing the compression stockings during waking hours for the two (2) weeks following sclerotherapy.  5. If you have any blisters, sores or ulcers or other problems following your procedure please call or return to the office immediately.     THE PROCEDURE FEE IS $350.00 PER FIFTEEN (15) MINUTE SESSION. WE REQUIRE THAT THIS PROCEDURE BE PAID FOR IN FULL ON OR BEFORE THE DATE THAT IT IS PERFORMED. WE WILL GIVE YOU A RECEIPT THAT YOU CAN FILE WITH YOUR INSURANCE COMPANY. WE GENERALLY DO NOT FILE THIS PROCEDURE WITH ANY INSURANCE COMPANY EXCEPT UNDER CERTAIN CIRCUMSTANCES WHERE PRIOR AUTHORIZATION HAS BEEN  CONFIRMED. THIS PROCEDURE IS GENERALLY CONSIDERED TO BE A COSMETIC PROCEDURE BY INSURANCE COMPANIES.  Due to recent changes in healthcare laws, you may see results of your pathology and/or laboratory studies on MyChart before the doctors have had a chance to review them. We understand that in some cases there may be results that are confusing or concerning to you. Please understand that not all results are received at the same time and often the doctors may need to interpret multiple results in order to provide you with the best plan of care or course of treatment. Therefore, we ask that you please give [place] 2 business days to thoroughly review all your results before contacting the office for clarification. Should we see a critical lab result, you will be contacted sooner.   If You Need Anything After Your Visit  If you have any questions or concerns for your doctor, please call our main line at [phone] and press option 4 to reach your doctor's medical assistant. If no one answers, please leave a voicemail as directed and we will return your call as soon as possible. Messages left after 4 pm will be answered the following business day.   You may also send [place] a message via MyChart. We typically respond to MyChart messages within 1-2 business days.  For prescription refills, please ask your pharmacy to contact our office. Our fax number is [phone].  If you have an urgent issue when the clinic is closed that cannot wait until the next business day, you can page your doctor at the number below.    Please  note that while we do our best to be available for urgent issues outside of office hours, we are not available 24/7.   If you have an urgent issue and are unable to reach [place], you may choose to seek medical care at your doctor's office, retail clinic, urgent care center, or emergency room.  If you have a medical emergency, please immediately call 911 or go to the emergency department.  Pager  Numbers  - Dr. Gwen Pounds: [phone]  - Dr. Roseanne Reno: [phone]  - Dr. Katrinka Blazing: [phone]   In the event of inclement weather, please call our main line at [phone] for an update on the status of any delays or closures.  Dermatology Medication Tips: Please keep the boxes that topical medications come in in order to help keep track of the instructions about where and how to use these. Pharmacies typically print the medication instructions only on the boxes and not directly on the medication tubes.   If your medication is too expensive, please contact our office at [phone] option 4 or send [place] a message through MyChart.   We are unable to tell what your co-pay for medications will be in advance as this is different depending on your insurance coverage. However, we may be able to find a substitute medication at lower cost or fill out paperwork to get insurance to cover a needed medication.   If a prior authorization is required to get your medication covered by your insurance company, please allow [place] 1-2 business days to complete this process.  Drug prices often vary depending on where the prescription is filled and some pharmacies may offer cheaper prices.  The website www.goodrx.com contains coupons for medications through different pharmacies. The prices here do not account for what the cost may be with help from insurance (it may be cheaper with your insurance), but the website can give you the price if you did not use any insurance.  - You can print the associated coupon and take it with your prescription to the pharmacy.  - You may also stop by our office during regular business hours and pick up a GoodRx coupon card.  - If you need your prescription sent electronically to a different pharmacy, notify our office through [facility] or by phone at [phone] option 4.     Si Usted Necesita Algo Despus de Su Visita  Tambin puede enviarnos un mensaje a travs de  Clinical cytogeneticist. Por lo general respondemos a los mensajes de MyChart en el transcurso de 1 a 2 das hbiles.  Para renovar recetas, por favor pida a su farmacia que se ponga en contacto con nuestra oficina. Annie Sable de fax es [place] [phone].  Si tiene un asunto urgente cuando la clnica est cerrada y que no puede esperar hasta el siguiente da hbil, puede llamar/localizar a su doctor(a) al nmero que aparece a continuacin.   Por favor, tenga en cuenta que aunque hacemos todo lo posible para estar disponibles para asuntos urgentes fuera del horario de [place], no estamos disponibles las 24 horas del da, los 7 [address]  [address] de la [place].   Si tiene un problema urgente y no puede comunicarse con nosotros, puede optar por buscar atencin mdica  en el consultorio de su doctor(a), en una clnica privada, en un centro de atencin urgente o en una sala de emergencias.  Si tiene Engineer, drilling, por favor llame inmediatamente al 911 o vaya a la sala de emergencias.  Nmeros de bper  - Dr. Gwen Pounds:  [phone]  - Dra. Roseanne Reno: [phone]  - Dr. Katrinka Blazing: [phone]   En caso de inclemencias del tiempo, por favor llame a Lacy Duverney principal al [phone] para una actualizacin sobre el [place] de cualquier retraso o cierre.  Consejos para la medicacin en dermatologa: Por favor, guarde las cajas en las que vienen los medicamentos de uso tpico para ayudarle a seguir las instrucciones sobre dnde y cmo usarlos. Las farmacias generalmente imprimen las instrucciones del medicamento slo en las cajas y no directamente en los tubos del [place].   Si su medicamento es muy caro, por favor, pngase en contacto con Rolm Gala llamando al [phone] y presione la opcin 4 o envenos un mensaje a travs de Clinical cytogeneticist.   No podemos decirle cul ser su copago por los medicamentos por adelantado ya que esto es diferente dependiendo de la cobertura de su seguro. Sin embargo, es posible que  podamos encontrar un medicamento sustituto a Audiological scientist un formulario para que el seguro cubra el medicamento que se considera necesario.   Si se requiere una autorizacin previa para que su compaa de seguros [place] su medicamento, por favor permtanos de 1 a 2 das hbiles para completar [address]  Los precios de los medicamentos varan con frecuencia dependiendo del Environmental consultant de dnde se surte la receta y alguna farmacias pueden ofrecer precios ms baratos.  El sitio web www.goodrx.com tiene cupones para medicamentos de Health and safety inspector. Los precios aqu no tienen en cuenta lo que podra costar con la ayuda del seguro (puede ser ms barato con su seguro), pero el sitio web puede darle el precio si no utiliz Tourist information centre manager.  - Puede imprimir el cupn correspondiente y llevarlo con su receta a la farmacia.  - Tambin puede pasar por nuestra oficina durante el horario de atencin regular y Education officer, museum una tarjeta de cupones de GoodRx.  - Si necesita que su receta se enve electrnicamente a una farmacia diferente, informe a nuestra oficina a travs de MyChart de [place] o por telfono llamando al [phone] y presione la opcin 4.

## 2024-04-24 NOTE — Progress Notes (Signed)
   Follow-Up Visit   Subjective  Patricia [name] is a 73 y.o. female who presents for the following: Sclerotherapy   The following portions of the chart were reviewed this encounter and updated as appropriate: medications, allergies, medical history  Review of Systems:  No other skin or systemic complaints except as noted in HPI or Assessment and Plan.  Objective  Well appearing patient in no apparent distress; mood and affect are within normal limits.  A focused examination was performed of the following areas: Legs   Relevant exam findings are noted in the Assessment and Plan.    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  Majority of treatment was to left lower leg. Multiple treatments are necessary for best result.  Discussed likelihood of post-inflammatory hyperpimentation (PIH) as injected vein resorb.  $350 cosmetic fee for today's treatment.  Procedure: The patient was counseled and understands about the effects, side effects and potential risks and complications of the sclerotherapy procedure. The patient was given the opportunity to ask questions. Asclera (polidocanol) 1% (total 2 cc) was injected into the varices. In order to ensure correct placement of the catheter in the vein, I drew back slightly to give moderate blood show in the syringe. If there was any evidence or suspicion of extravasation of sclerosant, the area was immediately diluted with a large volume of 0.9% saline. A pressure dressing was applied immediately to the injected sites. The patient tolerated the procedure well without complication. The patient was instructed in post-operative compression stocking use. The patient understands to call or return immediately if any problems noted.    Return as  scheduled with Dr Orval Blanc, Bernardine Bridegroom, CMA, am acting as scribe for Artemio Larry, MD .   Documentation: I have reviewed the above documentation for accuracy and completeness, and I agree with the above.  Artemio Larry, MD

## 2024-04-25 ENCOUNTER — Other Ambulatory Visit

## 2024-04-25 DIAGNOSIS — E875 Hyperkalemia: Secondary | ICD-10-CM | POA: Diagnosis not present

## 2024-04-25 DIAGNOSIS — Z13220 Encounter for screening for lipoid disorders: Secondary | ICD-10-CM | POA: Diagnosis not present

## 2024-04-25 DIAGNOSIS — R000 Tachycardia, unspecified: Secondary | ICD-10-CM

## 2024-04-25 DIAGNOSIS — I10 Essential (primary) hypertension: Secondary | ICD-10-CM

## 2024-04-25 DIAGNOSIS — R7303 Prediabetes: Secondary | ICD-10-CM

## 2024-04-25 DIAGNOSIS — N179 Acute kidney failure, unspecified: Secondary | ICD-10-CM | POA: Diagnosis not present

## 2024-04-25 DIAGNOSIS — I5032 Chronic diastolic (congestive) heart failure: Secondary | ICD-10-CM | POA: Diagnosis not present

## 2024-04-26 ENCOUNTER — Ambulatory Visit: Payer: Self-pay | Admitting: Family Medicine

## 2024-04-26 LAB — LIPID PANEL
Cholesterol: 159 mg/dL (ref <200)
HDL: 56 mg/dL (ref >=50)
LDL Cholesterol (Calc): 84 mg/dL
Non-HDL Cholesterol (Calc): 103 mg/dL (ref <130)
Total CHOL/HDL Ratio: 2.8 (calc) (ref <5.0)
Triglycerides: 99 mg/dL (ref <150)

## 2024-04-26 LAB — CBC WITH DIFFERENTIAL/PLATELET
Absolute Lymphocytes: 1394 {cells}/uL (ref 850–3900)
Absolute Monocytes: 992 {cells}/uL — ABNORMAL HIGH (ref 200–950)
Basophils Absolute: 49 {cells}/uL (ref 0–200)
Basophils Relative: 0.6 %
Eosinophils Absolute: 148 {cells}/uL (ref 15–500)
Eosinophils Relative: 1.8 %
HCT: 39 % (ref 35.0–45.0)
Hemoglobin: 12.6 g/dL (ref 11.7–15.5)
MCH: 29.2 pg (ref 27.0–33.0)
MCHC: 32.3 g/dL (ref 32.0–36.0)
MCV: 90.3 fL (ref 80.0–100.0)
MPV: 9.2 fL (ref 7.5–12.5)
Monocytes Relative: 12.1 %
Neutro Abs: 5617 {cells}/uL (ref 1500–7800)
Neutrophils Relative %: 68.5 %
Platelets: 405 10*3/uL — ABNORMAL HIGH (ref 140–400)
RBC: 4.32 10*6/uL (ref 3.80–5.10)
RDW: 13.3 % (ref 11.0–15.0)
Total Lymphocyte: 17 %
WBC: 8.2 10*3/uL (ref 3.8–10.8)

## 2024-04-26 LAB — COMPREHENSIVE METABOLIC PANEL WITH GFR
AG Ratio: 2 (calc) (ref 1.0–2.5)
ALT: 9 U/L (ref 6–29)
AST: 18 U/L (ref 10–35)
Albumin: 4.1 g/dL (ref 3.6–5.1)
Alkaline phosphatase (APISO): 93 U/L (ref 37–153)
BUN/Creatinine Ratio: 17 (calc) (ref 6–22)
BUN: 26 mg/dL — ABNORMAL HIGH (ref 7–25)
CO2: 33 mmol/L — ABNORMAL HIGH (ref 20–32)
Calcium: 9.5 mg/dL (ref 8.6–10.4)
Chloride: 97 mmol/L — ABNORMAL LOW (ref 98–110)
Creat: 1.57 mg/dL — ABNORMAL HIGH (ref 0.60–1.00)
Globulin: 2.1 g/dL (ref 1.9–3.7)
Glucose, Bld: 101 mg/dL — ABNORMAL HIGH (ref 65–99)
Potassium: 4.4 mmol/L (ref 3.5–5.3)
Sodium: 140 mmol/L (ref 135–146)
Total Bilirubin: 0.5 mg/dL (ref 0.2–1.2)
Total Protein: 6.2 g/dL (ref 6.1–8.1)
eGFR: 35 mL/min/{1.73_m2} — ABNORMAL LOW (ref >=60)

## 2024-04-26 LAB — TSH: TSH: 1.2 m[IU]/L (ref 0.40–4.50)

## 2024-04-30 ENCOUNTER — Encounter: Payer: Self-pay | Admitting: Family Medicine

## 2024-04-30 ENCOUNTER — Other Ambulatory Visit: Payer: Self-pay | Admitting: Family Medicine

## 2024-04-30 ENCOUNTER — Ambulatory Visit (INDEPENDENT_AMBULATORY_CARE_PROVIDER_SITE_OTHER): Admitting: Family Medicine

## 2024-04-30 VITALS — BP 110/56 | HR 85 | Temp 98.0°F | Ht 66.0 in | Wt 107.0 lb

## 2024-04-30 DIAGNOSIS — Z952 Presence of prosthetic heart valve: Secondary | ICD-10-CM | POA: Diagnosis not present

## 2024-04-30 DIAGNOSIS — Z0001 Encounter for general adult medical examination with abnormal findings: Secondary | ICD-10-CM | POA: Diagnosis not present

## 2024-04-30 DIAGNOSIS — I370 Nonrheumatic pulmonary valve stenosis: Secondary | ICD-10-CM

## 2024-04-30 DIAGNOSIS — Z9889 Other specified postprocedural states: Secondary | ICD-10-CM | POA: Diagnosis not present

## 2024-04-30 DIAGNOSIS — I480 Paroxysmal atrial fibrillation: Secondary | ICD-10-CM | POA: Diagnosis not present

## 2024-04-30 DIAGNOSIS — E039 Hypothyroidism, unspecified: Secondary | ICD-10-CM | POA: Diagnosis not present

## 2024-04-30 DIAGNOSIS — I5032 Chronic diastolic (congestive) heart failure: Secondary | ICD-10-CM

## 2024-04-30 DIAGNOSIS — Z0000 Encounter for general adult medical examination without abnormal findings: Secondary | ICD-10-CM

## 2024-04-30 NOTE — Progress Notes (Signed)
 Subjective:    Patient ID: Patricia [name], female    DOB: February 22, 1951, 73 y.o.   MRN: [id number]  Patient is a 73 year old female with history of complete heart block status post pacemaker, Hodgkin's lymphoma, breast cancer, mitral valve replacement, atrial valve replacement, pulmonary hypertension, atrial fibrillation s/p ablation who presents today for cpe.  Patient states she had a mammogram last year of her right breast.  This is up-to-date.  She had a colonoscopy in 2024.  Due to her age she does not require another.  She is waiting for her bone density.  They are going to contact her to schedule this.  She has been off Fosamax  for more than 5 years.  I recommended a therapeutic vacation from the Fosamax  due to the duration of therapy.  She is on calcium  and vitamin D .  She recently had to go to the emergency room for shortness of breath and was found to have pulmonary edema.  This is likely an exacerbation of chronic diastolic heart failure  Echo 6/24: 1. Left ventricular ejection fraction, by estimation, is 50 to 55%. The  left ventricle has low normal function. The left ventricle demonstrates  regional wall motion abnormalities. Septal hypokinesis. Left ventricular  diastolic parameters are  indeterminate.   2. Right ventricular systolic function is mildly reduced. The right  ventricular size is mildly enlarged. There is mildly elevated pulmonary  artery systolic pressure. The estimated right ventricular systolic  pressure is 37.1 mmHg.   3. The mitral valve has been repaired/replaced. No evidence of mitral  valve regurgitation. The mean mitral valve gradient is 7.0 mmHg with  average heart rate of 93 bpm, stable from prior echo. There is a 25 mm  Medtronic bioprosthetic valve present in  the mitral position.   4. The tricuspid valve is abnormal. Tricuspid valve regurgitation is  moderate to severe.   5. The aortic valve has been repaired/replaced. Aortic valve  regurgitation is  not visualized. There is a 19 mm Inspiris Resilia valve  present in the aortic position. Echo findings are consistent with normal  structure and function of the aortic valve  prosthesis.   6. Pulmonic valve regurgitation is moderate.   7. The inferior vena cava is normal in size with greater than 50%  respiratory variability, suggesting right atrial pressure of 3 mmHg.   Most recent labs are listed below: Lab on 04/25/2024  Component Date Value Ref Range Status   WBC 04/25/2024 8.2  3.8 - 10.8 Thousand/uL Final   RBC 04/25/2024 4.32  3.80 - 5.10 Million/uL Final   Hemoglobin 04/25/2024 12.6  11.7 - 15.5 g/dL Final   HCT 69/62/9528 39.0  35.0 - 45.0 % Final   MCV 04/25/2024 90.3  80.0 - 100.0 fL Final   MCH 04/25/2024 29.2  27.0 - 33.0 pg Final   MCHC 04/25/2024 32.3  32.0 - 36.0 g/dL Final   Comment: For adults, a slight decrease in the calculated MCHC value (in the range of 30 to 32 g/dL) is most likely not clinically significant; however, it should be interpreted with caution in correlation with other red cell parameters and the patient's clinical condition.    RDW 04/25/2024 13.3  11.0 - 15.0 % Final   Platelets 04/25/2024 405 (H)  140 - 400 Thousand/uL Final   MPV 04/25/2024 9.2  7.5 - 12.5 fL Final   Neutro Abs 04/25/2024 5,617  1,500 - 7,800 cells/uL Final   Absolute Lymphocytes 04/25/2024 1,394  850 - 3,900 cells/uL  Final   Absolute Monocytes 04/25/2024 992 (H)  200 - 950 cells/uL Final   Eosinophils Absolute 04/25/2024 148  15 - 500 cells/uL Final   Basophils Absolute 04/25/2024 49  0 - 200 cells/uL Final   Neutrophils Relative % 04/25/2024 68.5  % Final   Total Lymphocyte 04/25/2024 17.0  % Final   Monocytes Relative 04/25/2024 12.1  % Final   Eosinophils Relative 04/25/2024 1.8  % Final   Basophils Relative 04/25/2024 0.6  % Final   Cholesterol 04/25/2024 159  <200 mg/dL Final   HDL 16/09/9603 56  > OR = 50 mg/dL Final   Triglycerides 54/08/8118 99  <150 mg/dL Final    LDL Cholesterol (Calc) 04/25/2024 84  mg/dL (calc) Final   Comment: Reference range: <100 . Desirable range <100 mg/dL for primary prevention;   <70 mg/dL for patients with CHD or diabetic patients  with > or = 2 CHD risk factors. Aaron Aas LDL-C is now calculated using the Martin-Hopkins  calculation, which is a validated novel method providing  better accuracy than the Friedewald equation in the  estimation of LDL-C.  Melinda Sprawls et al. Erroll Heard. 1478;295(62): 2061-2068  (http://education.[facility].com/faq/FAQ164)    Total CHOL/HDL Ratio 04/25/2024 2.8  <1.3 (calc) Final   Non-HDL Cholesterol (Calc) 04/25/2024 103  <130 mg/dL (calc) Final   Comment: For patients with diabetes plus 1 major ASCVD risk  factor, treating to a non-HDL-C goal of <100 mg/dL  (LDL-C of <08 mg/dL) is considered a therapeutic  option.    Glucose, Bld 04/25/2024 101 (H)  65 - 99 mg/dL Final   Comment: .            Fasting reference interval . For someone without known diabetes, a glucose value between 100 and 125 mg/dL is consistent with prediabetes and should be confirmed with a follow-up test. .    BUN 04/25/2024 26 (H)  7 - 25 mg/dL Final   Creat 65/78/4696 1.57 (H)  0.60 - 1.00 mg/dL Final   eGFR 29/52/8413 35 (L)  > OR = 60 mL/min/1.28m2 Final   BUN/Creatinine Ratio 04/25/2024 17  6 - 22 (calc) Final   Sodium 04/25/2024 140  135 - 146 mmol/L Final   Potassium 04/25/2024 4.4  3.5 - 5.3 mmol/L Final   Chloride 04/25/2024 97 (L)  98 - 110 mmol/L Final   CO2 04/25/2024 33 (H)  20 - 32 mmol/L Final   Calcium  04/25/2024 9.5  8.6 - 10.4 mg/dL Final   Total Protein 24/40/1027 6.2  6.1 - 8.1 g/dL Final   Albumin  04/25/2024 4.1  3.6 - 5.1 g/dL Final   Globulin 25/36/6440 2.1  1.9 - 3.7 g/dL (calc) Final   AG Ratio 04/25/2024 2.0  1.0 - 2.5 (calc) Final   Total Bilirubin 04/25/2024 0.5  0.2 - 1.2 mg/dL Final   Alkaline phosphatase (APISO) 04/25/2024 93  37 - 153 U/L Final   AST 04/25/2024 18  10 - 35 U/L  Final   ALT 04/25/2024 9  6 - 29 U/L Final   TSH 04/25/2024 1.20  0.40 - 4.50 mIU/L Final     Past Medical History:  Diagnosis Date   Actinic keratosis    Anemia    Aortic atherosclerosis (HCC)    Aortic insufficiency    a.) s/p AVR 04/07/2022 --> 19 mm Inspiris Resilia valve   Atrial fibrillation and flutter (HCC)    a.) s/p ablation 1998; b.) CHA2DS2-VASc: 4 (age, sex, vascular disease history, T2DM); b.) rate/rhythm maintained on oral metoprolol   tartrate; no chronic OAC   Basal cell carcinoma 06/05/2019   R low back, EDC 07/16/19   Basal cell carcinoma 04/15/2010   Right post shoulder   Basal cell carcinoma 04/15/2010   Mid back   Basal cell carcinoma 07/13/2023   left top of shoulder, ED[facility] 09/26/23   Basal cell carcinoma (BCC) 09/29/2016   Left post shoulder. Superficial and nodular.   BCC (basal cell carcinoma) 07/21/2021   right mid back, [facility] 09/15/2021   BCC (basal cell carcinoma) 01/27/2022   left lower abdomen, ED[facility] 07/28/22   BCC (basal cell carcinoma) 07/28/2022   right lateral forehead, ED[facility] 08/10/22   BCC (basal cell carcinoma) 07/28/2022   left buttock, EDC 08/10/22   Bilateral pleural effusion    Bleeding gastric ulcer 2000   Breast cancer, left (HCC) 2005   a.)  s/p LEFT mastectomy 2005 --> lymph nodes were (+) --> Tx'd with systemic antineoplastic chemotherapy   CAD (coronary artery disease)    a.) cCTA 04/05/2022: Ca2+ 23 (51st %'ile for age/sex/race matched control); 25-49% mLAD calcification   CHB (complete heart block) (HCC)    a.) symptomatic; s/p Medtronic Azure XT DR MRI SureScan PPM placement 09/19/2017   COPD (chronic obstructive pulmonary disease) (HCC)    Diet-controlled type 2 diabetes mellitus (HCC)    Dysphonia    Dyspnea    GERD (gastroesophageal reflux disease)    History of basal cell carcinoma (BCC) 03/22/2021   right neck and right medial ear, Moh's   History of bilateral cataract extraction    History of blood transfusion    Hodgkin's  lymphoma (HCC) 1974   a.) s/p laparotomy for splenectomy and liver Bx in 1974 --> Tx'd with systemic chemotherapy + chest XRT --> recurrence 2 years later requiring additional systemic chemotherapy and XRT   Hypothyroidism    Lower leg edema    Mitral valve stenosis    a.) s/p minimally invasive MVR 09/13/2017 --> 25 mm Medtronic Mosaic porcine stented bioprosthetic tissue valve   NSVT (nonsustained ventricular tachycardia) (HCC)    Osteoporosis    lumbar verterbral fracture, left ankle fracture, dexa 2015   Pancreatitis    Pneumonia    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   a.) Medtronic Azure XT DR MRI SureScan [id number] (SN: [id number])   Pulmonary HTN (HCC)    a.) TTE: 05/14/14: RVSP 65; b.) TTE 08/31/15: RVSP 51, c.) TTE 12/23/16: RVSP 78; d.) RHC 06/27/17: mRA 6, mPA 37, mPCWP 18, AO sat 99, PA sat 67, CO 4.15, CI 2.43, PVR 4.6, AoV peak-peak 12, mitral MPG 11; e.) TTE 11/10/17: RVSP 63, f.) TTE 10/19/18: RVSP 52; g.) TTE 10/09/20: RVSP 42.9; h.) TTE 12/22/21: RVSP 54.7; i.) TTE 05/17/22: 47.7; j.) TTE 05/26/22: RVSP 40.5; k.) TTE 05/30/23: RVSP 37.1   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/07/2022   a.) s/p AVR 04/07/2022 --> 19 mm Inspiris Resilia valve   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 09/13/2017   a.) 25 mm Medtronic Mosaic porcine stented bioprosthetic tissue valve   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 ENDOSCOPY;  Service: Cardiovascular;;   CARDIAC ELECTROPHYSIOLOGY MAPPING AND ABLATION  1999   CATARACT EXTRACTION, BILATERAL     COLONOSCOPY     IR RADIOLOGY PERIPHERAL GUIDED IV START  08/03/2017   IR THORACENTESIS ASP PLEURAL SPACE W/IMG GUIDE  05/23/2022   IR THORACENTESIS ASP PLEURAL SPACE W/IMG GUIDE  05/24/2022   IR US  GUIDE VASC ACCESS RIGHT  08/03/2017   LAPAROTOMY N/A 1974   MASTECTOMY Left 2005   MICROLARYNGOSCOPY Bilateral 08/22/2023   Procedure: SUSPENSION MICROLARYNGOSCOPY WITH INJECTION;  Surgeon: Rogers Clayman, MD;  Location: [facility] ORS;  Service: ENT;  Laterality: Bilateral;   MITRAL VALVE REPAIR Right 09/13/2017   Procedure: MINIMALLY INVASIVE MITRAL VALVE REPLACEMENT (MVR) with Medtronic Mosaic Porcine Heart Valve size 25mm;  Surgeon: Gardenia Jump, MD;  Location: [facility] OR;  Service: Open Heart Surgery;  Laterality: Right;   ORIF ANKLE FRACTURE Left 03/21/2014   Procedure: LEFT ANKLE FRACTURE OPEN TREATMENT BILMALLEOLAR INCLUDES INTERNAL FIXATION ;  Surgeon: Saundra Curl, MD;  Location: [facility];  Service: Orthopedics;  Laterality: Left;   PACEMAKER IMPLANT N/A 09/19/2017   Medtronic Azure XT MRI conditional dual-chamber pacemaker for symptomatic complete heart block  by Dr Nunzio Belch   RIGHT/LEFT HEART CATH AND CORONARY ANGIOGRAPHY N/A 06/27/2017   Procedure: Right/Left Heart Cath and Coronary Angiography;  Surgeon: Darlis Eisenmenger, MD;  Location[facility] INVASIVE CV LAB;  Service: Cardiovascular;  Laterality: N/A;   SPLENECTOMY  1974   hodgkins disease   TEE WITHOUT CARDIOVERSION  06/12/2012   Procedure: TRANSESOPHAGEAL ECHOCARDIOGRAM (TEE);  Surgeon: Darlis Eisenmenger, MD;  Location: [facility] ENDOSCOPY;  Service: Cardiovascular;  Laterality: N/A;   TEE WITHOUT CARDIOVERSION N/A 06/29/2017   Procedure: TRANSESOPHAGEAL ECHOCARDIOGRAM (TEE);  Surgeon: Hugh Madura, MD;  Location: [facility] ENDOSCOPY;  Service: Cardiovascular;  Laterality: N/A;    TEE WITHOUT CARDIOVERSION N/A 09/13/2017   Procedure: TRANSESOPHAGEAL ECHOCARDIOGRAM (TEE);  Surgeon: Gardenia Jump, MD;  Location: [facility] OR;  Service: Open Heart Surgery;  Laterality: N/A;   TEE WITHOUT CARDIOVERSION N/A 01/12/2022   Procedure: TRANSESOPHAGEAL ECHOCARDIOGRAM (TEE);  Surgeon: Harrold Lincoln, MD;  Location: [facility] ENDOSCOPY;  Service: Cardiovascular;  Laterality: N/A;   TEE WITHOUT CARDIOVERSION N/A 04/07/2022   Procedure: TRANSESOPHAGEAL ECHOCARDIOGRAM (TEE);  Surgeon: Bartley Lightning, MD;  Location: [facility] OR;  Service: Open Heart Surgery;  Laterality: N/A;   TONSILLECTOMY AND ADENOIDECTOMY     Current Outpatient Medications on File Prior to Visit  Medication Sig Dispense Refill   ACCU-CHEK GUIDE TEST test strip USE TO TEST BLOOD SUGARS EVERY MORNING 100 strip 3   acetaminophen  (TYLENOL ) 325 MG tablet Take 2 tablets (650 mg total) by mouth 2 (two) times daily as needed for moderate pain or headache.     alendronate  (FOSAMAX ) 70 MG tablet TAKE ONE TABLET BY MOUTH ONCE A WEEK 12 tablet 3   Blood Glucose Monitoring Suppl (ACCU-CHEK GUIDE) w/Device KIT 1 Units by Does not apply route daily. 1 kit 0   Calcium  Carbonate (CALCIUM  600 PO) Take 600 mg by mouth daily. One a day     CREON  36000-114000 units CPEP capsule Take 36,000 Units by mouth 3 (three) times daily before meals.     esomeprazole (NEXIUM) 40 MG capsule Take 40 mg by mouth daily before breakfast.   12  fluticasone  (FLONASE ) 50 MCG/ACT nasal spray PLACE TWO SPRAYS INTO EACH NOSTRIL DAILY 16 mL 0   Lancets (ACCU-CHEK SOFT TOUCH) lancets Check BS QAM 100 each 3   Lancets Misc. (ACCU-CHEK SOFTCLIX LANCET DEV) KIT Check BS QAM 1 kit 0   levothyroxine  (SYNTHROID ) 75 MCG tablet TAKE ONE TABLET (75 MCG TOTAL) BY MOUTH DAILY. 90 tablet 0   metoprolol  tartrate (LOPRESSOR ) 100 MG tablet TAKE ONE TABLET BY MOUTH TWICE A DAY 180 tablet 3   Multiple Vitamin (MULTIVITAMIN WITH MINERALS) TABS tablet Take 1 tablet by mouth daily with  lunch. Centrum Silver.     torsemide  (DEMADEX ) 10 MG tablet Take 20 mg (2 tablets) in the morning and 10 mg (1 tablet) in the evening. 270 tablet 3   No current facility-administered medications on file prior to visit.     Allergies  Allergen Reactions   Aspirin  Other (See Comments)    Past bleeding ulcer. GI upset   Azithromycin Other (See Comments)    pancreatitis   Penicillins Anaphylaxis, Hives, Swelling and Other (See Comments)    PATIENT HAS HAD A PCN REACTION WITH IMMEDIATE RASH, FACIAL/TONGUE/THROAT SWELLING, SOB, OR LIGHTHEADEDNESS WITH HYPOTENSION:  #  #  #  YES  #  #  #   HAS PT DEVELOPED SEVERE RASH INVOLVING MUCUS MEMBRANES or SKIN NECROSIS: #  #  #  YES  #  #  #  Has patient had a PCN reaction that required hospitalization: No Has patient had a PCN reaction occurring within the last 10 years: No    Sulfa Antibiotics Hives and Rash   Cheese Nausea And Vomiting   Social History   Socioeconomic History   Marital status: Widowed    Spouse name: Not on file   Number of children: Not on file   Years of education: Not on file   Highest education level: Professional school degree (e.g., MD, DDS, DVM, JD)  Occupational History   Occupation: Runner, broadcasting/film/video (retired)  Tobacco Use   Smoking status: Never   Smokeless tobacco: Never  Vaping Use   Vaping status: Never Used  Substance and Sexual Activity   Alcohol use: No   Drug use: No   Sexual activity: Yes  Other Topics Concern   Not on file  Social History Narrative   Husband passed away from Cancer in 09/16/17.   Social Drivers of Corporate investment banker Strain: Low Risk  (12/14/2023)   Overall Financial Resource Strain (CARDIA)    Difficulty of Paying Living Expenses: Not hard at all  Food Insecurity: No Food Insecurity (12/14/2023)   Hunger Vital Sign    Worried About Running Out of Food in the Last Year: Never true    Ran Out of Food in the Last Year: Never true  Transportation Needs: No Transportation Needs  (12/14/2023)   PRAPARE - Administrator, Civil Service (Medical): No    Lack of Transportation (Non-Medical): No  Physical Activity: Insufficiently Active (12/08/2023)   Exercise Vital Sign    Days of Exercise per Week: 3 days    Minutes of Exercise per Session: 20 min  Stress: No Stress Concern Present (12/14/2023)   [employer] of Occupational Health - Occupational Stress Questionnaire    Feeling of Stress : Not at all  Social Connections: Moderately Integrated (12/14/2023)   Social Connection and Isolation Panel [NHANES]    Frequency of Communication with Friends and Family: More than three times a week    Frequency of Social  Gatherings with Friends and Family: Three times a week    Attends Religious Services: More than 4 times per year    Active Member of Clubs or Organizations: Yes    Attends Banker Meetings: More than 4 times per year    Marital Status: Widowed  Intimate Partner Violence: Not At Risk (12/14/2023)   Humiliation, Afraid, Rape, and Kick questionnaire    Fear of Current or Ex-Partner: No    Emotionally Abused: No    Physically Abused: No    Sexually Abused: No   Family History  Problem Relation Age of Onset   Rheumatic fever Father        Died suddenly age 59   Diabetes Father    Cancer Father        colon   Heart disease Father        rheumatic fever x 3   Arthritis Maternal Grandmother    Hypertension Maternal Grandmother    Diabetes Paternal Grandmother    Vision loss Paternal Grandmother        glaucoma      Review of Systems     Objective:   Physical Exam Vitals reviewed.  Constitutional:      General: She is not in acute distress.    Appearance: She is well-developed. She is not diaphoretic.  HENT:     Head: Normocephalic and atraumatic.  Neck:     Thyroid : No thyromegaly.     Vascular: No JVD.  Cardiovascular:     Rate and Rhythm: Normal rate and regular rhythm.     Heart sounds: Murmur heard.     No  friction rub. No gallop.  Pulmonary:     Effort: Pulmonary effort is normal. No respiratory distress.     Breath sounds: Normal breath sounds. No stridor. No wheezing or rales.  Chest:     Chest wall: No tenderness.  Abdominal:     General: Bowel sounds are normal. There is no distension.     Palpations: Abdomen is soft. There is no mass.     Tenderness: There is no abdominal tenderness. There is no guarding or rebound.  Musculoskeletal:        General: No tenderness. Normal range of motion.     Cervical back: Neck supple.     Right lower leg: No edema.     Left lower leg: No edema.  Lymphadenopathy:     Cervical: No cervical adenopathy.  Skin:    General: Skin is warm.     Coloration: Skin is not pale.     Findings: No erythema or rash.  Neurological:     Mental Status: She is alert and oriented to person, place, and time.     Cranial Nerves: No cranial nerve deficit.     Motor: No abnormal muscle tone.     Coordination: Coordination normal.     Deep Tendon Reflexes: Reflexes normal.  Psychiatric:        Behavior: Behavior normal.        Thought Content: Thought content normal.        Judgment: Judgment normal.           Assessment & Plan:  Chronic diastolic heart failure (HCC)  S/P MVR (mitral valve repair)  S/P aortic valve replacement  Pulmonary valve stenosis, unspecified etiology  Paroxysmal atrial fibrillation (HCC)  Hypothyroidism, unspecified type  General medical exam Reviewing her immunizations, I recommended that she receive the new pneumonia vaccine capvaxive.  Also recommended  that she receive the RSV vaccine.  Her mammogram and colonoscopy are up-to-date.  I recommended that she stop Fosamax .  If her T-score was much worse than -2.6 on her upcoming bone density test I would replace Fosamax  with Prolia.  Continue calcium  and vitamin D .  We discussed treatment strategies to help prevent future exacerbations of her congestive heart failure.  She appears  slightly dehydrated today so I recommended she reduce her torsemide  to 20 mg in the morning and 10 mg in the evening.  I recommended that she weigh herself daily and if her weight goes up more than 2 pounds take 20 mg twice daily until her weight returns to normal.  We also discussed starting an SGLT2 medication to help treat and prevent future exacerbations of congestive heart failure such as Jardiance.  I recommended she discuss this with her cardiologist.

## 2024-05-01 DIAGNOSIS — R490 Dysphonia: Secondary | ICD-10-CM | POA: Diagnosis not present

## 2024-05-01 DIAGNOSIS — J3801 Paralysis of vocal cords and larynx, unilateral: Secondary | ICD-10-CM | POA: Diagnosis not present

## 2024-05-01 DIAGNOSIS — J383 Other diseases of vocal cords: Secondary | ICD-10-CM | POA: Diagnosis not present

## 2024-05-02 NOTE — Telephone Encounter (Signed)
 Requested Prescriptions  Pending Prescriptions Disp Refills   fluticasone  (FLONASE ) 50 MCG/ACT nasal spray [Pharmacy Med Name: FLUTICASONE  PROPIONATE SUSPENSION] 16 mL 2    Sig: PLACE TWO SPRAYS INTO EACH NOSTRIL DAILY     Ear, Nose, and Throat: Nasal Preparations - Corticosteroids Passed - 05/02/2024  7:48 AM      Passed - Valid encounter within last 12 months    Recent Outpatient Visits           2 days ago Chronic diastolic heart failure (HCC)   [place] [facility] Austine Lefort, MD   1 week ago Chronic obstructive pulmonary disease, unspecified COPD type [facility])   [place] [facility] Amadeo June, MD   1 year ago Aspiration into airway, initial encounter   [place] [facility] Austine Lefort, MD   1 year ago Viral upper respiratory tract infection   [place] [facility] Austine Lefort, MD   1 year ago Acute pulmonary edema [facility])   [place] [facility] Pickard, Cisco Crest, MD       Future Appointments             In 10 months Elta Halter, MD [facility]

## 2024-05-08 DIAGNOSIS — R633 Feeding difficulties, unspecified: Secondary | ICD-10-CM | POA: Diagnosis not present

## 2024-05-08 DIAGNOSIS — R1314 Dysphagia, pharyngoesophageal phase: Secondary | ICD-10-CM | POA: Diagnosis not present

## 2024-05-08 DIAGNOSIS — R490 Dysphonia: Secondary | ICD-10-CM | POA: Diagnosis not present

## 2024-05-08 DIAGNOSIS — J3801 Paralysis of vocal cords and larynx, unilateral: Secondary | ICD-10-CM | POA: Diagnosis not present

## 2024-05-08 DIAGNOSIS — R1310 Dysphagia, unspecified: Secondary | ICD-10-CM | POA: Diagnosis not present

## 2024-05-10 NOTE — Progress Notes (Signed)
 Remote pacemaker transmission.

## 2024-05-10 NOTE — Addendum Note (Signed)
 Addended by: Lott Rouleau A on: 05/10/2024 01:15 PM   Modules accepted: Orders

## 2024-05-23 DIAGNOSIS — K219 Gastro-esophageal reflux disease without esophagitis: Secondary | ICD-10-CM | POA: Diagnosis not present

## 2024-05-23 DIAGNOSIS — R1310 Dysphagia, unspecified: Secondary | ICD-10-CM | POA: Diagnosis not present

## 2024-05-23 DIAGNOSIS — R634 Abnormal weight loss: Secondary | ICD-10-CM | POA: Diagnosis not present

## 2024-05-23 DIAGNOSIS — K8681 Exocrine pancreatic insufficiency: Secondary | ICD-10-CM | POA: Diagnosis not present

## 2024-05-27 DIAGNOSIS — J3801 Paralysis of vocal cords and larynx, unilateral: Secondary | ICD-10-CM | POA: Diagnosis not present

## 2024-05-27 DIAGNOSIS — R490 Dysphonia: Secondary | ICD-10-CM | POA: Diagnosis not present

## 2024-05-27 DIAGNOSIS — J383 Other diseases of vocal cords: Secondary | ICD-10-CM | POA: Diagnosis not present

## 2024-05-31 ENCOUNTER — Ambulatory Visit: Attending: Internal Medicine | Admitting: Internal Medicine

## 2024-05-31 ENCOUNTER — Encounter: Payer: Self-pay | Admitting: Internal Medicine

## 2024-05-31 VITALS — BP 126/60 | HR 85 | Ht 66.0 in | Wt 106.0 lb

## 2024-05-31 DIAGNOSIS — I340 Nonrheumatic mitral (valve) insufficiency: Secondary | ICD-10-CM

## 2024-05-31 DIAGNOSIS — M810 Age-related osteoporosis without current pathological fracture: Secondary | ICD-10-CM | POA: Diagnosis not present

## 2024-05-31 NOTE — Patient Instructions (Addendum)
 Medication Instructions:  Your physician recommends that you continue on your current medications as directed. Please refer to the Current Medication list given to you today.  *If you need a refill on your cardiac medications before your next appointment, please call your pharmacy*  Lab Work: None ordered.  You may go to any [facility] Location for your lab work:  [facility] - [address] ([facility]) - [address] [phone] [address]  [place] - [address]   [place]  - [address]   [place] - [address] - [address] [employer]  - [address] ([facility]   If you have labs (blood work) drawn today and your tests are completely normal, you will receive your results only by: [employer] (if you have MyChart)  If you have any lab test that is abnormal or we need to change your treatment, we will call you or send a MyChart message to review the results.  Testing/Procedures: Bone Density scan  Follow-Up: At [facility], you and your health needs are our priority.  As part of our continuing mission to provide you with exceptional heart care, we have created designated Provider Care Teams.  These Care Teams include your primary Cardiologist (physician) and Advanced Practice Providers (APPs -  Physician Assistants and Nurse Practitioners) who all work together to provide you with the care you need, when you need it.  Your next appointment:   1 year(s)  The format for your next appointment:   In Person  Provider:   Manya Sells, MD{or one of the following Advanced Practice Providers on your designated Care Team:   Mertha Abrahams, PA-C Michael Andy Tillery, PA-C Brandy Ollis, NP

## 2024-05-31 NOTE — Progress Notes (Signed)
 HPI Patricia [name] follows up today for ongoing evaluation and management of her PPM, NSVT, remote atrial flutter s/p ablation, and prior MV replacement (porcine 2018). Since she was seen last by me a year ago, she had undergone SAVR 2 years ago (2023).  She feels well. No chest pain. She has not had syncope. She is maintaining NSR.  Allergies  Allergen Reactions   Aspirin  Other (See Comments)    Past bleeding ulcer. GI upset   Azithromycin Other (See Comments)    pancreatitis   Penicillins Anaphylaxis, Hives, Swelling and Other (See Comments)    PATIENT HAS HAD A PCN REACTION WITH IMMEDIATE RASH, FACIAL/TONGUE/THROAT SWELLING, SOB, OR LIGHTHEADEDNESS WITH HYPOTENSION:  #  #  #  YES  #  #  #   HAS PT DEVELOPED SEVERE RASH INVOLVING MUCUS MEMBRANES or SKIN NECROSIS: #  #  #  YES  #  #  #  Has patient had a PCN reaction that required hospitalization: No Has patient had a PCN reaction occurring within the last 10 years: No    Sulfa Antibiotics Hives and Rash   Cheese Nausea And Vomiting     Current Outpatient Medications  Medication Sig Dispense Refill   ACCU-CHEK GUIDE TEST test strip USE TO TEST BLOOD SUGARS EVERY MORNING 100 strip 3   acetaminophen  (TYLENOL ) 325 MG tablet Take 2 tablets (650 mg total) by mouth 2 (two) times daily as needed for moderate pain or headache.     alendronate  (FOSAMAX ) 70 MG tablet TAKE ONE TABLET BY MOUTH ONCE A WEEK 12 tablet 3   Blood Glucose Monitoring Suppl (ACCU-CHEK GUIDE) w/Device KIT 1 Units by Does not apply route daily. 1 kit 0   Calcium  Carbonate (CALCIUM  600 PO) Take 600 mg by mouth daily. One a day     CREON  36000-114000 units CPEP capsule Take 36,000 Units by mouth 3 (three) times daily before meals.     esomeprazole (NEXIUM) 40 MG capsule Take 40 mg by mouth daily before breakfast.   12   fluticasone  (FLONASE ) 50 MCG/ACT nasal spray PLACE TWO SPRAYS INTO EACH NOSTRIL DAILY 16 mL 2   Lancets (ACCU-CHEK SOFT TOUCH) lancets Check BS QAM 100  each 3   Lancets Misc. (ACCU-CHEK SOFTCLIX LANCET DEV) KIT Check BS QAM 1 kit 0   levothyroxine  (SYNTHROID ) 75 MCG tablet TAKE ONE TABLET (75 MCG TOTAL) BY MOUTH DAILY. 90 tablet 0   metoprolol  tartrate (LOPRESSOR ) 100 MG tablet TAKE ONE TABLET BY MOUTH TWICE A DAY 180 tablet 3   Multiple Vitamin (MULTIVITAMIN WITH MINERALS) TABS tablet Take 1 tablet by mouth daily with lunch. Centrum Silver.     torsemide  (DEMADEX ) 10 MG tablet Take 20 mg (2 tablets) in the morning and 10 mg (1 tablet) in the evening. (Patient taking differently: Take 20 mg by mouth 2 (two) times daily. Take 20 mg (2 tablets) in the morning and 10 mg (1 tablet) in the evening.) 270 tablet 3   No current facility-administered medications for this visit.     Past Medical History:  Diagnosis Date   Actinic keratosis    Anemia    Aortic atherosclerosis (HCC)    Aortic insufficiency    a.) s/p AVR 04/07/2022 --> 19 mm Inspiris Resilia valve   Atrial fibrillation and flutter (HCC)    a.) s/p ablation 1998; b.) CHA2DS2-VASc: 4 (age, sex, vascular disease history, T2DM); b.) rate/rhythm maintained on oral metoprolol  tartrate; no chronic OAC   Basal cell  carcinoma 06/05/2019   R low back, EDC 07/16/19   Basal cell carcinoma 04/15/2010   Right post shoulder   Basal cell carcinoma 04/15/2010   Mid back   Basal cell carcinoma 07/13/2023   left top of shoulder, EDC 09/26/23   Basal cell carcinoma (BCC) 09/29/2016   Left post shoulder. Superficial and nodular.   BCC (basal cell carcinoma) 07/21/2021   right mid back, [facility] 09/15/2021   BCC (basal cell carcinoma) 01/27/2022   left lower abdomen, EDC 07/28/22   BCC (basal cell carcinoma) 07/28/2022   right lateral forehead, EDC 08/10/22   BCC (basal cell carcinoma) 07/28/2022   left buttock, EDC 08/10/22   Bilateral pleural effusion    Bleeding gastric ulcer 2000   Breast cancer, left (HCC) 2005   a.)  s/p LEFT mastectomy 2005 --> lymph nodes were (+) --> Tx'd with systemic  antineoplastic chemotherapy   CAD (coronary artery disease)    a.) cCTA 04/05/2022: Ca2+ 23 (51st %'ile for age/sex/race matched control); 25-49% mLAD calcification   CHB (complete heart block) (HCC)    a.) symptomatic; s/p Medtronic Azure XT DR MRI SureScan PPM placement 09/19/2017   COPD (chronic obstructive pulmonary disease) (HCC)    Diet-controlled type 2 diabetes mellitus (HCC)    Dysphonia    Dyspnea    GERD (gastroesophageal reflux disease)    History of basal cell carcinoma (BCC) 03/22/2021   right neck and right medial ear, Moh's   History of bilateral cataract extraction    History of blood transfusion    Hodgkin's lymphoma (HCC) 1974   a.) s/p laparotomy for splenectomy and liver Bx in 1974 --> Tx'd with systemic chemotherapy + chest XRT --> recurrence 2 years later requiring additional systemic chemotherapy and XRT   Hypothyroidism    Lower leg edema    Mitral valve stenosis    a.) s/p minimally invasive MVR 09/13/2017 --> 25 mm Medtronic Mosaic porcine stented bioprosthetic tissue valve   NSVT (nonsustained ventricular tachycardia) (HCC)    Osteoporosis    lumbar verterbral fracture, left ankle fracture, dexa 2015   Pancreatitis    Pneumonia    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   a.) Medtronic Azure XT DR MRI SureScan [id number] (SN: [id number])   Pulmonary HTN (HCC)    a.) TTE: 05/14/14: RVSP 65; b.) TTE 08/31/15: RVSP 51, c.) TTE 12/23/16: RVSP 78; d.) RHC 06/27/17: mRA 6, mPA 37, mPCWP 18, AO sat 99, PA sat 67, CO 4.15, CI 2.43, PVR 4.6, AoV peak-peak 12, mitral MPG 11; e.) TTE 11/10/17: RVSP 63, f.) TTE 10/19/18: RVSP 52; g.) TTE 10/09/20: RVSP 42.9; h.) TTE 12/22/21: RVSP 54.7; i.) TTE 05/17/22: 47.7; j.) TTE 05/26/22: RVSP 40.5; k.) TTE 05/30/23: RVSP 37.1   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/07/2022   a.) s/p AVR 04/07/2022 --> 19 mm Inspiris Resilia valve   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 09/13/2017   a.) 25 mm  Medtronic Mosaic porcine stented bioprosthetic tissue valve   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   Past Surgical History:  Procedure Laterality Date   AORTIC VALVE REPLACEMENT N/A 04/07/2022   Procedure: AORTIC VALVE REPLACEMENT (AVR), USING INSPIRIS 19 MM AORTIC VALVE;  Surgeon: Sherene Dilling,  Shellie Dials, MD;  Location: [facility] OR;  Service: Open Heart Surgery;  Laterality: N/A;   ATRIAL FLUTTER ABLATION N/A 1998   BUBBLE STUDY  01/12/2022   Procedure: BUBBLE STUDY;  Surgeon: Harrold Lincoln, MD;  Location: [facility] ENDOSCOPY;  Service: Cardiovascular;;   CARDIAC ELECTROPHYSIOLOGY MAPPING AND ABLATION  1999   CATARACT EXTRACTION, BILATERAL     COLONOSCOPY     IR RADIOLOGY PERIPHERAL GUIDED IV START  08/03/2017   IR THORACENTESIS ASP PLEURAL SPACE W/IMG GUIDE  05/23/2022   IR THORACENTESIS ASP PLEURAL SPACE W/IMG GUIDE  05/24/2022   IR US  GUIDE VASC ACCESS RIGHT  08/03/2017   LAPAROTOMY N/A 1974   MASTECTOMY Left 2005   MICROLARYNGOSCOPY Bilateral 08/22/2023   Procedure: SUSPENSION MICROLARYNGOSCOPY WITH INJECTION;  Surgeon: Rogers Clayman, MD;  Location: [facility] ORS;  Service: ENT;  Laterality: Bilateral;   MITRAL VALVE REPAIR Right 09/13/2017   Procedure: MINIMALLY INVASIVE MITRAL VALVE REPLACEMENT (MVR) with Medtronic Mosaic Porcine Heart Valve size 25mm;  Surgeon: Gardenia Jump, MD;  Location: [facility] OR;  Service: Open Heart Surgery;  Laterality: Right;   ORIF ANKLE FRACTURE Left 03/21/2014   Procedure: LEFT ANKLE FRACTURE OPEN TREATMENT BILMALLEOLAR INCLUDES INTERNAL FIXATION ;  Surgeon: Saundra Curl, MD;  Location: [facility];  Service: Orthopedics;  Laterality: Left;   PACEMAKER IMPLANT N/A 09/19/2017   Medtronic Azure XT MRI conditional dual-chamber  pacemaker for symptomatic complete heart block  by Dr Nunzio Belch   RIGHT/LEFT HEART CATH AND CORONARY ANGIOGRAPHY N/A 06/27/2017   Procedure: Right/Left Heart Cath and Coronary Angiography;  Surgeon: Darlis Eisenmenger, MD;  Location: [facility] INVASIVE CV LAB;  Service: Cardiovascular;  Laterality: N/A;   SPLENECTOMY  1974   hodgkins disease   TEE WITHOUT CARDIOVERSION  06/12/2012   Procedure: TRANSESOPHAGEAL ECHOCARDIOGRAM (TEE);  Surgeon: Darlis Eisenmenger, MD;  Location: [facility] ENDOSCOPY;  Service: Cardiovascular;  Laterality: N/A;   TEE WITHOUT CARDIOVERSION N/A 06/29/2017   Procedure: TRANSESOPHAGEAL ECHOCARDIOGRAM (TEE);  Surgeon: Hugh Madura, MD;  Location: [facility] ENDOSCOPY;  Service: Cardiovascular;  Laterality: N/A;   TEE WITHOUT CARDIOVERSION N/A 09/13/2017   Procedure: TRANSESOPHAGEAL ECHOCARDIOGRAM (TEE);  Surgeon: Gardenia Jump, MD;  Location: [facility] OR;  Service: Open Heart Surgery;  Laterality: N/A;   TEE WITHOUT CARDIOVERSION N/A 01/12/2022   Procedure: TRANSESOPHAGEAL ECHOCARDIOGRAM (TEE);  Surgeon: Harrold Lincoln, MD;  Location: [facility] ENDOSCOPY;  Service: Cardiovascular;  Laterality: N/A;   TEE WITHOUT CARDIOVERSION N/A 04/07/2022   Procedure: TRANSESOPHAGEAL ECHOCARDIOGRAM (TEE);  Surgeon: Bartley Lightning, MD;  Location: [facility] OR;  Service: Open Heart Surgery;  Laterality: N/A;   TONSILLECTOMY AND ADENOIDECTOMY       Family History  Problem Relation Age of Onset   Rheumatic fever Father        Died suddenly age 36   Diabetes Father    Cancer Father        colon   Heart disease Father        rheumatic fever x 3   Arthritis Maternal Grandmother    Hypertension Maternal Grandmother    Diabetes Paternal Grandmother    Vision loss Paternal Grandmother        glaucoma     Social History   Socioeconomic History   Marital status: Widowed    Spouse name: Not on file   Number of children: Not on file   Years of education: Not on file   Highest education level: Professional school  degree (e.g., MD, DDS, DVM, JD)  Occupational History   Occupation: Runner, broadcasting/film/video (retired)  Tobacco Use   Smoking status: Never   Smokeless tobacco: Never  Vaping Use   Vaping status: Never Used  Substance and Sexual Activity   Alcohol use: No   Drug use: No   Sexual activity: Yes  Other Topics Concern   Not on file  Social History Narrative   Husband passed away from Cancer in 09/19/17.   Social Drivers of Corporate investment banker Strain: Low Risk  (12/14/2023)   Overall Financial Resource Strain (CARDIA)    Difficulty of Paying Living Expenses: Not hard at all  Food Insecurity: No Food Insecurity (12/14/2023)   Hunger Vital Sign    Worried About Running Out of Food in the Last Year: Never true    Ran Out of Food in the Last Year: Never true  Transportation Needs: No Transportation Needs (12/14/2023)   PRAPARE - Administrator, Civil Service (Medical): No    Lack of Transportation (Non-Medical): No  Physical Activity: Insufficiently Active (12/08/2023)   Exercise Vital Sign    Days of Exercise per Week: 3 days    Minutes of Exercise per Session: 20 min  Stress: No Stress Concern Present (12/14/2023)   [employer] of Occupational Health - Occupational Stress Questionnaire    Feeling of Stress : Not at all  Social Connections: Moderately Integrated (12/14/2023)   Social Connection and Isolation Panel    Frequency of Communication with Friends and Family: More than three times a week    Frequency of Social Gatherings with Friends and Family: Three times a week    Attends Religious Services: More than 4 times per year    Active Member of Clubs or Organizations: Yes    Attends Banker Meetings: More than 4 times per year    Marital Status: Widowed  Intimate Partner Violence: Not At Risk (12/14/2023)   Humiliation, Afraid, Rape, and Kick questionnaire    Fear of Current or Ex-Partner: No    Emotionally Abused: No    Physically Abused: No    Sexually Abused:  No     BP 126/60   Pulse 85   Ht 5' 6 (1.676 m)   Wt 106 lb (48.1 kg)   SpO2 99%   BMI 17.11 kg/m   Physical Exam:  Well appearing NAD HEENT: Unremarkable Neck:  No JVD, no thyromegally Lymphatics:  No adenopathy Back:  No CVA tenderness Lungs:  Clear HEART:  Regular rate rhythm, no murmurs, no rubs, no clicks Abd:  soft, positive bowel sounds, no organomegally, no rebound, no guarding Ext:  2 plus pulses, no edema, no cyanosis, no clubbing Skin:  No rashes no nodules Neuro:  CN II through XII intact, motor grossly intact  DEVICE  Normal device function.  See PaceArt for details.   Assess/Plan:  CHB - she is asymptomatic s/p PPM insertion AI - she is s/p SAVR and doing well.  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  Wt loss - she needs to gain weight. I suspect that she will need a relocation of her PPM to a sub pec location at the time of her next gen change.    Pete Brand Samiel Peel,MD

## 2024-06-11 ENCOUNTER — Ambulatory Visit

## 2024-06-11 LAB — HM DIABETES EYE EXAM

## 2024-06-19 NOTE — Progress Notes (Signed)
 Patricia [name] arrived 06/11/24 and has given verbal consent to obtain images and complete their overdue diabetic retinal screening.  The images have been sent to an ophthalmologist or optometrist for review and interpretation.  Results will be sent back to Duanne Butler DASEN, MD for review.  Patient has been informed they will be contacted when we receive the results via telephone or MyChart

## 2024-06-19 NOTE — Progress Notes (Signed)
 Images were received from diabetic eye exam. The images were unreadable. It is recommended that pt get a referral to an ophthalmologist for further imaging. The patient will be contacted and referred if agreeable.

## 2024-06-22 ENCOUNTER — Encounter (HOSPITAL_BASED_OUTPATIENT_CLINIC_OR_DEPARTMENT_OTHER): Payer: Self-pay | Admitting: Emergency Medicine

## 2024-06-22 ENCOUNTER — Emergency Department (HOSPITAL_BASED_OUTPATIENT_CLINIC_OR_DEPARTMENT_OTHER): Admission: EM | Admit: 2024-06-22 | Discharge: 2024-06-22 | Disposition: A | Discharge status: Home / Self Care

## 2024-06-22 ENCOUNTER — Emergency Department (HOSPITAL_BASED_OUTPATIENT_CLINIC_OR_DEPARTMENT_OTHER)

## 2024-06-22 ENCOUNTER — Other Ambulatory Visit: Payer: Self-pay

## 2024-06-22 DIAGNOSIS — M79671 Pain in right foot: Secondary | ICD-10-CM | POA: Insufficient documentation

## 2024-06-22 DIAGNOSIS — M791 Myalgia, unspecified site: Secondary | ICD-10-CM | POA: Diagnosis not present

## 2024-06-22 DIAGNOSIS — Y92009 Unspecified place in unspecified non-institutional (private) residence as the place of occurrence of the external cause: Secondary | ICD-10-CM | POA: Insufficient documentation

## 2024-06-22 DIAGNOSIS — W19XXXA Unspecified fall, initial encounter: Secondary | ICD-10-CM | POA: Diagnosis not present

## 2024-06-22 DIAGNOSIS — M25562 Pain in left knee: Secondary | ICD-10-CM | POA: Diagnosis not present

## 2024-06-22 DIAGNOSIS — M79661 Pain in right lower leg: Secondary | ICD-10-CM | POA: Diagnosis not present

## 2024-06-22 DIAGNOSIS — M7918 Myalgia, other site: Secondary | ICD-10-CM

## 2024-06-22 DIAGNOSIS — S99911A Unspecified injury of right ankle, initial encounter: Secondary | ICD-10-CM | POA: Diagnosis present

## 2024-06-22 DIAGNOSIS — M25561 Pain in right knee: Secondary | ICD-10-CM | POA: Diagnosis not present

## 2024-06-22 DIAGNOSIS — M25571 Pain in right ankle and joints of right foot: Secondary | ICD-10-CM | POA: Diagnosis not present

## 2024-06-22 DIAGNOSIS — S9001XA Contusion of right ankle, initial encounter: Secondary | ICD-10-CM | POA: Diagnosis not present

## 2024-06-22 NOTE — Discharge Instructions (Addendum)
 Today you were seen after a fall.  You may rest, ice, compress, and elevate the affected limbs.  You may alternate taking Tylenol  and Motrin as needed for pain and swelling.  Please follow-up with your primary care or orthopedics if your symptoms persist for further evaluation and workup.  Thank you for letting us  treat you today. After reviewing your imaging, I feel you are safe to go home. Please follow up with your PCP in the next several days and provide them with your records from this visit. Return to the Emergency Room if pain becomes severe or symptoms worsen.

## 2024-06-22 NOTE — ED Provider Notes (Addendum)
 [facility] EMERGENCY DEPARTMENT AT [facility] Provider Note   CSN: [id number] Arrival date & time: 06/22/24  1827     Patient presents with: Patricia [name] is a 73 y.o. female presents today after a mechanical fall at home.  Patient reports bilateral knee and right ankle, leg, and foot pain.  Patient endorses intense pain when bearing weight.  Patient denies LOC, head trauma, numbness, tingling, loss of bowel or bladder control, saddle anesthesia, weakness, diplopia, tinnitus, neck pain, back pain or any other complaints at this time.  Patient denies blood thinner use    Fall       Prior to Admission medications   Medication Sig Start Date End Date Taking? Authorizing Provider  ACCU-CHEK GUIDE TEST test strip USE TO TEST BLOOD SUGARS EVERY MORNING 04/16/24   Duanne Butler DASEN, MD  acetaminophen  (TYLENOL ) 325 MG tablet Take 2 tablets (650 mg total) by mouth 2 (two) times daily as needed for moderate pain or headache. 09/21/17   Dwan Aldo M, PA-C  alendronate  (FOSAMAX ) 70 MG tablet TAKE ONE TABLET BY MOUTH ONCE A WEEK 01/22/24   Duanne Butler DASEN, MD  Blood Glucose Monitoring Suppl (ACCU-CHEK GUIDE) w/Device KIT 1 Units by Does not apply route daily. 12/25/19   Duanne Butler DASEN, MD  Calcium  Carbonate (CALCIUM  600 PO) Take 600 mg by mouth daily. One a day    [provider]  CREON  36000-114000 units CPEP capsule Take 36,000 Units by mouth 3 (three) times daily before meals. 12/23/21   [provider]  esomeprazole (NEXIUM) 40 MG capsule Take 40 mg by mouth daily before breakfast.  07/02/15   [provider]  fluticasone  (FLONASE ) 50 MCG/ACT nasal spray PLACE TWO SPRAYS INTO EACH NOSTRIL DAILY 05/02/24   Duanne Butler DASEN, MD  Lancets (ACCU-CHEK SOFT TOUCH) lancets Check BS QAM 07/08/20   Duanne Butler DASEN, MD  Lancets Misc. (ACCU-CHEK SOFTCLIX LANCET DEV) KIT Check BS QAM 12/25/19   Duanne Butler DASEN, MD  levothyroxine  (SYNTHROID ) 75 MCG  tablet TAKE ONE TABLET (75 MCG TOTAL) BY MOUTH DAILY. 04/23/24   Duanne Butler DASEN, MD  metoprolol  tartrate (LOPRESSOR ) 100 MG tablet TAKE ONE TABLET BY MOUTH TWICE A DAY 08/18/23   Waddell Danelle ORN, MD  Multiple Vitamin (MULTIVITAMIN WITH MINERALS) TABS tablet Take 1 tablet by mouth daily with lunch. Centrum Silver.    [provider]  torsemide  (DEMADEX ) 10 MG tablet Take 20 mg (2 tablets) in the morning and 10 mg (1 tablet) in the evening. Patient taking differently: Take 20 mg by mouth 2 (two) times daily. Take 20 mg (2 tablets) in the morning and 10 mg (1 tablet) in the evening. 03/26/24   Vicci Rollo SAUNDERS, PA-C    Allergies: Aspirin , Azithromycin, Penicillins, Sulfa antibiotics, and Cheese    Review of Systems  Musculoskeletal:  Positive for arthralgias.    Updated Vital Signs BP (!) 126/58 (BP Location: Right Arm)   Pulse 96   Temp 98.4 F (36.9 C) (Oral)   Resp 18   Ht 5' 6 (1.676 m)   Wt 47.6 kg   SpO2 98%   BMI 16.95 kg/m   Physical Exam Vitals and nursing note reviewed.  Constitutional:      General: She is not in acute distress.    Appearance: Normal appearance. She is well-developed.  HENT:     Head: Normocephalic and atraumatic.  Eyes:     Conjunctiva/sclera: Conjunctivae normal.  Cardiovascular:  Rate and Rhythm: Normal rate and regular rhythm.     Pulses: Normal pulses.     Heart sounds: Normal heart sounds. No murmur heard. Pulmonary:     Effort: Pulmonary effort is normal. No respiratory distress.     Breath sounds: Normal breath sounds.  Abdominal:     Palpations: Abdomen is soft.     Tenderness: There is no abdominal tenderness.  Musculoskeletal:        General: Swelling, tenderness and signs of injury present.     Cervical back: Normal range of motion and neck supple. No rigidity.     Comments: Patient with bilateral tenderness to palpation of the proximal tibia with mild swelling on exam.  Patient also reports extreme pain to palpation  of the medial and lateral malleoli of the right ankle with associated swelling and mild ecchymosis.  Patient also reports tenderness to palpation of the mid right tibia, no swelling, deformity, or ecchymosis noted in this area.  Patient is neurovascularly intact.  +2 dorsalis pedis pulses bilaterally.  Skin:    General: Skin is warm and dry.     Capillary Refill: Capillary refill takes less than 2 seconds.     Findings: Bruising present.  Neurological:     General: No focal deficit present.     Mental Status: She is alert and oriented to person, place, and time.  Psychiatric:        Mood and Affect: Mood normal.     (all labs ordered are listed, but only abnormal results are displayed) Labs Reviewed - No data to display  EKG: None  Radiology: DG Knee Complete 4 Views Left Result Date: 06/22/2024 CLINICAL DATA:  Pain after fall. EXAM: LEFT KNEE - COMPLETE 4+ VIEW COMPARISON:  None Available. FINDINGS: The bones are subjectively under mineralized no evidence of fracture, dislocation, or joint effusion. No evidence of arthropathy or other focal bone abnormality. Mild soft tissue edema. IMPRESSION: Mild soft tissue edema. No fracture or subluxation of the left knee. Electronically Signed   By: Andrea Gasman M.D.   On: 06/22/2024 19:23   DG Knee Complete 4 Views Right Result Date: 06/22/2024 CLINICAL DATA:  Pain after fall EXAM: RIGHT KNEE - COMPLETE 4+ VIEW; RIGHT ANKLE - COMPLETE 3+ VIEW; RIGHT TIBIA AND FIBULA - 2 VIEW; RIGHT FOOT COMPLETE - 3+ VIEW COMPARISON:  MRI of the right knee 09/28/2021 FINDINGS: Demineralization. No acute fracture or dislocation of the right knee, tibia, fibula, ankle, or foot. No knee joint effusion. Soft tissue swelling about the lateral malleolus. Plantar calcaneal spur. IMPRESSION: 1. No acute fracture or dislocation of the right knee, tibia, fibula, ankle, or foot. 2. Soft tissue swelling about the lateral malleolus. Electronically Signed   By: Norman Gatlin  M.D.   On: 06/22/2024 19:22   DG Foot Complete Right Result Date: 06/22/2024 CLINICAL DATA:  Pain after fall EXAM: RIGHT KNEE - COMPLETE 4+ VIEW; RIGHT ANKLE - COMPLETE 3+ VIEW; RIGHT TIBIA AND FIBULA - 2 VIEW; RIGHT FOOT COMPLETE - 3+ VIEW COMPARISON:  MRI of the right knee 09/28/2021 FINDINGS: Demineralization. No acute fracture or dislocation of the right knee, tibia, fibula, ankle, or foot. No knee joint effusion. Soft tissue swelling about the lateral malleolus. Plantar calcaneal spur. IMPRESSION: 1. No acute fracture or dislocation of the right knee, tibia, fibula, ankle, or foot. 2. Soft tissue swelling about the lateral malleolus. Electronically Signed   By: Norman Gatlin M.D.   On: 06/22/2024 19:22   DG Ankle Complete  Right Result Date: 06/22/2024 CLINICAL DATA:  Pain after fall EXAM: RIGHT KNEE - COMPLETE 4+ VIEW; RIGHT ANKLE - COMPLETE 3+ VIEW; RIGHT TIBIA AND FIBULA - 2 VIEW; RIGHT FOOT COMPLETE - 3+ VIEW COMPARISON:  MRI of the right knee 09/28/2021 FINDINGS: Demineralization. No acute fracture or dislocation of the right knee, tibia, fibula, ankle, or foot. No knee joint effusion. Soft tissue swelling about the lateral malleolus. Plantar calcaneal spur. IMPRESSION: 1. No acute fracture or dislocation of the right knee, tibia, fibula, ankle, or foot. 2. Soft tissue swelling about the lateral malleolus. Electronically Signed   By: Norman Gatlin M.D.   On: 06/22/2024 19:22   DG Tibia/Fibula Right Result Date: 06/22/2024 CLINICAL DATA:  Pain after fall EXAM: RIGHT KNEE - COMPLETE 4+ VIEW; RIGHT ANKLE - COMPLETE 3+ VIEW; RIGHT TIBIA AND FIBULA - 2 VIEW; RIGHT FOOT COMPLETE - 3+ VIEW COMPARISON:  MRI of the right knee 09/28/2021 FINDINGS: Demineralization. No acute fracture or dislocation of the right knee, tibia, fibula, ankle, or foot. No knee joint effusion. Soft tissue swelling about the lateral malleolus. Plantar calcaneal spur. IMPRESSION: 1. No acute fracture or dislocation of the right  knee, tibia, fibula, ankle, or foot. 2. Soft tissue swelling about the lateral malleolus. Electronically Signed   By: Norman Gatlin M.D.   On: 06/22/2024 19:22     Procedures   Medications Ordered in the ED - No data to display                                  Medical Decision Making Amount and/or Complexity of Data Reviewed Radiology: ordered.   This patient presents to the ED for concern of fall differential diagnosis includes cauda equina syndrome, musculoskeletal pain, fracture, dislocation, head injury, spinal cord injury    Additional history obtained   Additional history obtained from Electronic Medical Record External records from outside source obtained and reviewed including cardiology and family medicine office visit notes  Imaging Studies ordered:  I ordered imaging studies including right foot, ankle, tib-fib, and knee x-rays, left knee x-ray I independently visualized and interpreted imaging which showed no acute fracture or dislocation of the right knee, tibia, fibula, ankle, or foot.  Soft tissue swelling about the lateral malleolus.  Mild soft tissue edema of the left knee.  No fracture or subluxation of the left knee. I agree with the radiologist interpretation  Problem List / ED Course:  Patient placed in lace up ankle brace and bilateral knee braces at patient's request Considered for admission or further workup however patient's vital signs, physical exam, and imaging of been reassuring.  Patient symptoms likely due to musculoskeletal pain.  Patient advised to ice, rest, compress, and elevate affected limbs.  Patient given return precautions.  I feel patient safe for discharge at this time.     Final diagnoses:  Musculoskeletal pain    ED Discharge Orders     None          Francis Ileana LOISE DEVONNA 06/22/24 1936    Francis Ileana LOISE, PA-C 06/22/24 1945    Simon Lavonia LOISE, MD 06/22/24 2046

## 2024-06-22 NOTE — ED Triage Notes (Signed)
 Pt via pov from home with bilateral knee and right foot pain. Pt states she was coming down the steps and missed one, causing her to fall. Pt reports bearing weight is very painful. Pt denies hitting her head; no loc. Pt a&o x 4; nad noted.

## 2024-06-23 DIAGNOSIS — S8981XA Other specified injuries of right lower leg, initial encounter: Secondary | ICD-10-CM | POA: Diagnosis not present

## 2024-06-23 DIAGNOSIS — S80911A Unspecified superficial injury of right knee, initial encounter: Secondary | ICD-10-CM | POA: Diagnosis not present

## 2024-06-23 DIAGNOSIS — S80912A Unspecified superficial injury of left knee, initial encounter: Secondary | ICD-10-CM | POA: Diagnosis not present

## 2024-06-24 DIAGNOSIS — S8002XA Contusion of left knee, initial encounter: Secondary | ICD-10-CM | POA: Diagnosis not present

## 2024-06-24 DIAGNOSIS — S8001XA Contusion of right knee, initial encounter: Secondary | ICD-10-CM | POA: Diagnosis not present

## 2024-06-24 DIAGNOSIS — S93491A Sprain of other ligament of right ankle, initial encounter: Secondary | ICD-10-CM | POA: Diagnosis not present

## 2024-06-25 ENCOUNTER — Ambulatory Visit (INDEPENDENT_AMBULATORY_CARE_PROVIDER_SITE_OTHER): Payer: Medicare PPO

## 2024-06-25 DIAGNOSIS — I442 Atrioventricular block, complete: Secondary | ICD-10-CM

## 2024-06-25 LAB — CUP PACEART REMOTE DEVICE CHECK
Battery Remaining Longevity: 31 mo
Battery Voltage: 2.92 V
Brady Statistic AP VP Percent: 0.07 %
Brady Statistic AP VS Percent: 0 %
Brady Statistic AS VP Percent: 99.93 %
Brady Statistic AS VS Percent: 0 %
Brady Statistic RA Percent Paced: 0.07 %
Brady Statistic RV Percent Paced: 100 %
Date Time Interrogation Session: 20250714214114
Implantable Lead Connection Status: 753985
Implantable Lead Connection Status: 753985
Implantable Lead Implant Date: 20181009
Implantable Lead Implant Date: 20181009
Implantable Lead Location: 753859
Implantable Lead Location: 753860
Implantable Lead Model: 5076
Implantable Lead Model: 5076
Implantable Pulse Generator Implant Date: 20181009
Lead Channel Impedance Value: 247 Ohm
Lead Channel Impedance Value: 304 Ohm
Lead Channel Impedance Value: 342 Ohm
Lead Channel Impedance Value: 418 Ohm
Lead Channel Pacing Threshold Amplitude: 0.875 V
Lead Channel Pacing Threshold Amplitude: 1 V
Lead Channel Pacing Threshold Pulse Width: 0.4 ms
Lead Channel Pacing Threshold Pulse Width: 0.4 ms
Lead Channel Sensing Intrinsic Amplitude: 2.5 mV
Lead Channel Sensing Intrinsic Amplitude: 2.5 mV
Lead Channel Sensing Intrinsic Amplitude: 4.375 mV
Lead Channel Sensing Intrinsic Amplitude: 4.375 mV
Lead Channel Setting Pacing Amplitude: 2 V
Lead Channel Setting Pacing Amplitude: 2.5 V
Lead Channel Setting Pacing Pulse Width: 0.4 ms
Lead Channel Setting Sensing Sensitivity: 2 mV
Zone Setting Status: 755011
Zone Setting Status: 755011

## 2024-06-26 ENCOUNTER — Ambulatory Visit: Payer: Self-pay | Admitting: Internal Medicine

## 2024-07-09 DIAGNOSIS — S8002XA Contusion of left knee, initial encounter: Secondary | ICD-10-CM | POA: Diagnosis not present

## 2024-07-09 DIAGNOSIS — S8001XA Contusion of right knee, initial encounter: Secondary | ICD-10-CM | POA: Diagnosis not present

## 2024-07-09 DIAGNOSIS — S93491A Sprain of other ligament of right ankle, initial encounter: Secondary | ICD-10-CM | POA: Diagnosis not present

## 2024-07-17 ENCOUNTER — Other Ambulatory Visit: Payer: Self-pay | Admitting: Family Medicine

## 2024-07-22 ENCOUNTER — Other Ambulatory Visit: Payer: Self-pay | Admitting: Family Medicine

## 2024-07-29 ENCOUNTER — Other Ambulatory Visit: Payer: Self-pay | Admitting: Internal Medicine

## 2024-07-30 DIAGNOSIS — S93491A Sprain of other ligament of right ankle, initial encounter: Secondary | ICD-10-CM | POA: Diagnosis not present

## 2024-08-14 ENCOUNTER — Telehealth: Payer: Self-pay | Admitting: Family Medicine

## 2024-08-14 ENCOUNTER — Other Ambulatory Visit: Payer: Self-pay | Admitting: Family Medicine

## 2024-08-14 NOTE — Telephone Encounter (Unsigned)
 Copied from CRM [id number]. Topic: Clinical - Medication Refill >> Aug 14, 2024  1:57 PM Zy'onna H wrote: Medication: Not listed (COVID Booster Injection: Pfizer COVID-19 Vaccine Bivalent Booster)   Has the patient contacted their pharmacy? Yes (Agent: If no, request that the patient contact the pharmacy for the refill. If patient does not wish to contact the pharmacy document the reason why and proceed with request.) (Agent: If yes, when and what did the pharmacy advise?)  This is the patient's preferred pharmacy:  [facility] [address] - [address] [place], [place] - [address] [phone]   Is this the correct pharmacy for this prescription? Yes If no, delete pharmacy and type the correct one.   Has the prescription been filled recently? No  Is the patient out of the medication? Yes  Has the patient been seen for an appointment in the last year OR does the patient have an upcoming appointment? Yes  Can we respond through MyChart? Yes  Agent: Please be advised that Rx refills may take up to 3 business days. We ask that you follow-up with your pharmacy.

## 2024-08-14 NOTE — Telephone Encounter (Unsigned)
 Copied from CRM [id number]. Topic: Clinical - Medication Refill >> Aug 14, 2024  3:08 PM Amy B wrote: Medication: COVID vaccine  Has the patient contacted their pharmacy? Yes  Pharmacy requires a prescription for the COVID vaccine.  (Agent: If no, request that the patient contact the pharmacy for the refill. If patient does not wish to contact the pharmacy document the reason why and proceed with request.) (Agent: If yes, when and what did the pharmacy advise?)  This is the patient's preferred pharmacy:   [facility] [address] [place] - [address] [place] [address] Phone: [phone] Fax: [phone]  Is this the correct pharmacy for this prescription? Yes If no, delete pharmacy and type the correct one.   Has the prescription been filled recently? No  Is the patient out of the medication? Yes  Has the patient been seen for an appointment in the last year OR does the patient have an upcoming appointment? Yes  Can we respond through MyChart? Yes  Agent: Please be advised that Rx refills may take up to 3 business days. We ask that you follow-up with your pharmacy.

## 2024-08-15 ENCOUNTER — Other Ambulatory Visit: Payer: Self-pay

## 2024-08-15 MED ORDER — COVID-19 MRNA VAC-TRIS(PFIZER) 30 MCG/0.3ML IM SUSY: 0.3000 mL | PREFILLED_SYRINGE | Freq: Once | INTRAMUSCULAR | 0 refills | Status: AC

## 2024-08-15 NOTE — Telephone Encounter (Signed)
 Pt called following up on request, pt is currently at the pharmacy and is upset that this is not ready for her. Advised the patient that Dr. Clara day off is Wednesday and that this will be addressed as soon as the providers are able to. Pt says she is going out of the country next week.

## 2024-08-16 DIAGNOSIS — R490 Dysphonia: Secondary | ICD-10-CM | POA: Diagnosis not present

## 2024-08-16 DIAGNOSIS — J3801 Paralysis of vocal cords and larynx, unilateral: Secondary | ICD-10-CM | POA: Diagnosis not present

## 2024-08-16 DIAGNOSIS — R1314 Dysphagia, pharyngoesophageal phase: Secondary | ICD-10-CM | POA: Diagnosis not present

## 2024-09-17 DIAGNOSIS — Z1231 Encounter for screening mammogram for malignant neoplasm of breast: Secondary | ICD-10-CM | POA: Diagnosis not present

## 2024-09-17 LAB — HM MAMMOGRAPHY

## 2024-09-18 NOTE — Progress Notes (Signed)
 Remote PPM Transmission

## 2024-09-24 ENCOUNTER — Ambulatory Visit (INDEPENDENT_AMBULATORY_CARE_PROVIDER_SITE_OTHER): Payer: Medicare PPO

## 2024-09-24 DIAGNOSIS — I4892 Unspecified atrial flutter: Secondary | ICD-10-CM

## 2024-09-25 LAB — CUP PACEART REMOTE DEVICE CHECK
Battery Remaining Longevity: 29 mo
Battery Voltage: 2.91 V
Brady Statistic AP VP Percent: 0.01 %
Brady Statistic AP VS Percent: 0 %
Brady Statistic AS VP Percent: 99.99 %
Brady Statistic AS VS Percent: 0 %
Brady Statistic RA Percent Paced: 0.01 %
Brady Statistic RV Percent Paced: 100 %
Date Time Interrogation Session: 20251013205303
Implantable Lead Connection Status: 753985
Implantable Lead Connection Status: 753985
Implantable Lead Implant Date: 20181009
Implantable Lead Implant Date: 20181009
Implantable Lead Location: 753859
Implantable Lead Location: 753860
Implantable Lead Model: 5076
Implantable Lead Model: 5076
Implantable Pulse Generator Implant Date: 20181009
Lead Channel Impedance Value: 247 Ohm
Lead Channel Impedance Value: 285 Ohm
Lead Channel Impedance Value: 342 Ohm
Lead Channel Impedance Value: 399 Ohm
Lead Channel Pacing Threshold Amplitude: 0.875 V
Lead Channel Pacing Threshold Amplitude: 1 V
Lead Channel Pacing Threshold Pulse Width: 0.4 ms
Lead Channel Pacing Threshold Pulse Width: 0.4 ms
Lead Channel Sensing Intrinsic Amplitude: 2.75 mV
Lead Channel Sensing Intrinsic Amplitude: 2.75 mV
Lead Channel Sensing Intrinsic Amplitude: 4.375 mV
Lead Channel Sensing Intrinsic Amplitude: 4.375 mV
Lead Channel Setting Pacing Amplitude: 2 V
Lead Channel Setting Pacing Amplitude: 2.5 V
Lead Channel Setting Pacing Pulse Width: 0.4 ms
Lead Channel Setting Sensing Sensitivity: 2 mV
Zone Setting Status: 755011
Zone Setting Status: 755011

## 2024-09-26 ENCOUNTER — Ambulatory Visit: Payer: Self-pay | Admitting: Internal Medicine

## 2024-09-26 NOTE — Progress Notes (Signed)
 Remote PPM Transmission

## 2024-10-16 ENCOUNTER — Other Ambulatory Visit: Payer: Self-pay | Admitting: Family Medicine

## 2024-10-23 DIAGNOSIS — H401132 Primary open-angle glaucoma, bilateral, moderate stage: Secondary | ICD-10-CM | POA: Diagnosis not present

## 2024-11-29 ENCOUNTER — Encounter: Payer: Self-pay | Admitting: Cardiology

## 2024-12-13 NOTE — Progress Notes (Signed)
 " Cardiology Office Note:   Date:  12/16/2024  ID:  Patricia [name], DOB Jan 13, 1951, MRN [id number] PCP: Duanne Butler DASEN, MD  [name] Providers Cardiologist:  Lynwood Schilling, MD Electrophysiologist:  Danelle Birmingham, MD {  History of Present Illness:   Patricia [name] is a 74 y.o. 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 She has had atrial flutter ablated.  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  She is now status post AVR.  She had post procedure atrial fibrillation.  She had pleural effusions requiring thoracentesis.  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  Since I last saw her she has done some traveling.  She has been to [place].  She is actually getting ready to go to [place] and [place].  She does get dyspneic when she has to walk a mild to moderate distance.  She thinks this is slowly progressive.  She is not having any new resting shortness of breath and denies any PND or orthopnea though she chronically sleeps on 3 pillows.  She is not having any new weight gain or edema.  She denies any chest pressure, neck or arm discomfort.  She has had no cough fevers or chills.  She has some rare palpitations.  ROS: As stated in the HPI and negative for all other systems.   Studies Reviewed:    EKG:   EKG Interpretation Date/Time:  Monday December 16 2024 09:10:34 EST Ventricular Rate:  99 PR Interval:  206 QRS Duration:  150 QT Interval:  404 QTC Calculation: 518 R Axis:   98  Text Interpretation: Atrial-sensed ventricular-paced rhythm When compared with ECG of 17-Mar-2024 10:25, No significant change since last tracing PACs are present Confirmed by Schilling Rattan ([id number]) on 12/16/2024 9:19:44 AM   Risk Assessment/Calculations:      Physical Exam:   VS:  BP 128/86   Pulse (!) 101   Ht 5' 6 (1.676 m)   Wt 109 lb (49.4 kg)   SpO2 96%   BMI 17.59 kg/m    Wt  Readings from Last 3 Encounters:  12/16/24 109 lb (49.4 kg)  06/22/24 105 lb (47.6 kg)  05/31/24 106 lb (48.1 kg)     GEN: Well nourished, well developed in no acute distress NECK: No JVD; No carotid bruits CARDIAC: RRR, 3 out of 6 apical and mid left sternal border systolic murmur holosystolic, no diastolic murmurs, rubs, gallops RESPIRATORY:  Clear to auscultation without rales, wheezing or rhonchi  ABDOMEN: Soft, non-tender, non-distended EXTREMITIES:  No edema; No deformity   ASSESSMENT AND PLAN:   MVR:   I will be following this up with echo as below.   AVR:   This was stable on echo in 2024.  I will get a follow-up echocardiogram.   Flutter:   She has had atrial flutter ablated.  She has rare palpitations and I am not suspecting recurrent dysrhythmia.  No change in therapy.   She has had no symptomatic recurrence of this.  She is off of anticoagulation because of life-threatening serious bleeding in the past.   HTN:   The blood pressure is elevated but she says this is unusual and she is going to keep a blood pressure diary.  Bradycardia: She has had prior pacemaker placement and I reviewed the most recent interrogation.   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  Tachycardia: Heart rates going fast and has been running in the 100 range.  At this point I am going to check blood work to include a CBC and TSH.  CKD: Creatinine had been creeping up.  I will follow-up with a basic metabolic profile.  TR:    See above.  Follow up like to see her back in 6 months or sooner based on the results of testing above or progressive symptoms.  Signed, Lynwood Schilling, MD   "

## 2024-12-16 ENCOUNTER — Encounter: Payer: Self-pay | Admitting: Cardiology

## 2024-12-16 ENCOUNTER — Ambulatory Visit: Attending: Cardiology | Admitting: Cardiology

## 2024-12-16 VITALS — BP 128/86 | HR 101 | Ht 66.0 in | Wt 109.0 lb

## 2024-12-16 DIAGNOSIS — I361 Nonrheumatic tricuspid (valve) insufficiency: Secondary | ICD-10-CM | POA: Diagnosis not present

## 2024-12-16 DIAGNOSIS — R0602 Shortness of breath: Secondary | ICD-10-CM

## 2024-12-16 DIAGNOSIS — I10 Essential (primary) hypertension: Secondary | ICD-10-CM

## 2024-12-16 DIAGNOSIS — R001 Bradycardia, unspecified: Secondary | ICD-10-CM | POA: Diagnosis not present

## 2024-12-16 DIAGNOSIS — I4892 Unspecified atrial flutter: Secondary | ICD-10-CM

## 2024-12-16 NOTE — Patient Instructions (Signed)
 Medication Instructions:  Your physician recommends that you continue on your current medications as directed. Please refer to the Current Medication list given to you today.  *If you need a refill on your cardiac medications before your next appointment, please call your pharmacy*  Lab Work: BMET, BNP, CBC, TSH today at [facility] If you have labs (blood work) drawn today and your tests are completely normal, you will receive your results only by: MyChart Message (if you have MyChart) OR A paper copy in the mail If you have any lab test that is abnormal or we need to change your treatment, we will call you to review the results.  Testing/Procedures: Echocardiogram Your physician has requested that you have an echocardiogram. Echocardiography is a painless test that uses sound waves to create images of your heart. It provides your doctor with information about the size and shape of your heart and how well your hearts chambers and valves are working. This procedure takes approximately one hour. There are no restrictions for this procedure. Please do NOT wear cologne, perfume, aftershave, or lotions (deodorant is allowed). Please arrive 15 minutes prior to your appointment time.  Please note: We ask at that you not bring children with you during ultrasound (echo/ vascular) testing. Due to room size and safety concerns, children are not allowed in the ultrasound rooms during exams. Our front office staff cannot provide observation of children in our lobby area while testing is being conducted. An adult accompanying a patient to their appointment will only be allowed in the ultrasound room at the discretion of the ultrasound technician under special circumstances. We apologize for any inconvenience.   Follow-Up: At [facility], you and your health needs are our priority.  As part of our continuing mission to provide you with exceptional heart care, our providers are all part of one team.   This team includes your primary Cardiologist (physician) and Advanced Practice Providers or APPs (Physician Assistants and Nurse Practitioners) who all work together to provide you with the care you need, when you need it.  Your next appointment:   6 month(s)  Provider:   Lynwood Schilling, MD    We recommend signing up for the patient portal called MyChart.  Sign up information is provided on this After Visit Summary.  MyChart is used to connect with patients for Virtual Visits (Telemedicine).  Patients are able to view lab/test results, encounter notes, upcoming appointments, etc.  Non-urgent messages can be sent to your provider as well.   To learn more about what you can do with MyChart, go to [email or web address].

## 2024-12-17 LAB — BASIC METABOLIC PANEL WITH GFR
BUN/Creatinine Ratio: 27 (ref 12–28)
BUN: 34 mg/dL — ABNORMAL HIGH (ref 8–27)
CO2: 30 mmol/L — ABNORMAL HIGH (ref 20–29)
Calcium: 10.2 mg/dL (ref 8.7–10.3)
Chloride: 97 mmol/L (ref 96–106)
Creatinine, Ser: 1.25 mg/dL — ABNORMAL HIGH (ref 0.57–1.00)
Glucose: 107 mg/dL — ABNORMAL HIGH (ref 70–99)
Potassium: 4.3 mmol/L (ref 3.5–5.2)
Sodium: 142 mmol/L (ref 134–144)
eGFR: 45 mL/min/1.73 — ABNORMAL LOW

## 2024-12-17 LAB — PRO B NATRIURETIC PEPTIDE: NT-Pro BNP: 1956 pg/mL — ABNORMAL HIGH (ref 0–301)

## 2024-12-17 LAB — CBC
Hematocrit: 39.3 % (ref 34.0–46.6)
Hemoglobin: 12.9 g/dL (ref 11.1–15.9)
MCH: 29.1 pg (ref 26.6–33.0)
MCHC: 32.8 g/dL (ref 31.5–35.7)
MCV: 89 fL (ref 79–97)
Platelets: 421 x10E3/uL (ref 150–450)
RBC: 4.44 x10E6/uL (ref 3.77–5.28)
RDW: 14.3 % (ref 11.7–15.4)
WBC: 6.8 x10E3/uL (ref 3.4–10.8)

## 2024-12-17 LAB — TSH: TSH: 1.6 u[IU]/mL (ref 0.450–4.500)

## 2024-12-19 ENCOUNTER — Ambulatory Visit

## 2024-12-19 VITALS — Ht 66.0 in | Wt 109.0 lb

## 2024-12-19 DIAGNOSIS — Z0000 Encounter for general adult medical examination without abnormal findings: Secondary | ICD-10-CM

## 2024-12-19 NOTE — Progress Notes (Addendum)
 "  Chief Complaint  Patient presents with   Medicare Wellness     Subjective:   Patricia [name] is a 74 y.o. female who presents for a Medicare Annual Wellness Visit.  Visit info / Clinical Intake: Medicare Wellness Visit Type:: Subsequent Annual Wellness Visit Persons participating in visit and providing information:: patient Medicare Wellness Visit Mode:: Telephone If telephone:: video declined Since this visit was completed virtually, some vitals may be partially provided or unavailable. Missing vitals are due to the limitations of the virtual format.: Documented vitals are patient reported If Telephone or Video please confirm:: I connected with patient using audio/video enable telemedicine. I verified patient identity with two identifiers, discussed telehealth limitations, and patient agreed to proceed. Patient Location:: home Provider Location:: office Interpreter Needed?: No Pre-visit prep was completed: yes AWV questionnaire completed by patient prior to visit?: yes Date:: 12/18/24 Living arrangements:: (!) lives alone Patient's Overall Health Status Rating: good Typical amount of pain: some Does pain affect daily life?: no Are you currently prescribed opioids?: no  Dietary Habits and Nutritional Risks How many meals a day?: 3 Eats fruit and vegetables daily?: yes Most meals are obtained by: preparing own meals In the last 2 weeks, have you had any of the following?: none Diabetic:: (!) yes Any non-healing wounds?: no How often do you check your BS?: as needed Would you like to be referred to a Nutritionist or for Diabetic Management? : no  Functional Status Activities of Daily Living (to include ambulation/medication): Independent Ambulation: Independent Medication Administration: Independent Home Management (perform basic housework or laundry): Independent Manage your own finances?: yes Primary transportation is: driving Concerns about vision?: no *vision  screening is required for WTM* Concerns about hearing?: no  Fall Screening Falls in the past year?: 0 Number of falls in past year: 0 Was there an injury with Fall?: 0 Fall Risk Category Calculator: 0 Patient Fall Risk Level: Low Fall Risk  Fall Risk Patient at Risk for Falls Due to: Impaired mobility Fall risk Follow up: Education provided; Falls prevention discussed; Falls evaluation completed  Home and Transportation Safety: All rugs have non-skid backing?: yes All stairs or steps have railings?: yes Grab bars in the bathtub or shower?: yes Have non-skid surface in bathtub or shower?: yes Good home lighting?: yes Regular seat belt use?: yes Hospital stays in the last year:: no  Cognitive Assessment Difficulty concentrating, remembering, or making decisions? : no Will 6CIT or Mini Cog be Completed: no 6CIT or Mini Cog Declined: patient alert, oriented, able to answer questions appropriately and recall recent events  Advance Directives (For Healthcare) Does Patient Have a Medical Advance Directive?: Yes Does patient want to make changes to medical advance directive?: Yes (MAU/Ambulatory/Procedural Areas - Information given) Type of Advance Directive: Healthcare Power of Attorney Copy of Healthcare Power of Attorney in Chart?: Yes - validated most recent copy scanned in chart (See row information)  Reviewed/Updated  Reviewed/Updated: Reviewed All (Medical, Surgical, Family, Medications, Allergies, Care Teams, Patient Goals)    Allergies (verified) Aspirin , Azithromycin, Penicillins, Sulfa antibiotics, and Cheese   Current Medications (verified) Outpatient Encounter Medications as of 12/19/2024  Medication Sig   ACCU-CHEK GUIDE TEST test strip USE TO TEST BLOOD SUGARS EVERY MORNING   acetaminophen  (TYLENOL ) 325 MG tablet Take 2 tablets (650 mg total) by mouth 2 (two) times daily as needed for moderate pain or headache.   alendronate  (FOSAMAX ) 70 MG tablet TAKE ONE TABLET  BY MOUTH ONCE A WEEK   Blood Glucose Monitoring  Suppl (ACCU-CHEK GUIDE) w/Device KIT 1 Units by Does not apply route daily.   Calcium  Carbonate (CALCIUM  600 PO) Take 600 mg by mouth daily. One a day   CREON  36000-114000 units CPEP capsule Take 36,000 Units by mouth 3 (three) times daily before meals.   esomeprazole (NEXIUM) 40 MG capsule Take 40 mg by mouth daily before breakfast.    fluticasone  (FLONASE ) 50 MCG/ACT nasal spray PLACE TWO SPRAYS INTO EACH NOSTRIL DAILY   Lancets (ACCU-CHEK SOFT TOUCH) lancets Check BS QAM   Lancets Misc. (ACCU-CHEK SOFTCLIX LANCET DEV) KIT Check BS QAM   levothyroxine  (SYNTHROID ) 75 MCG tablet TAKE ONE TABLET (75 MCG TOTAL) BY MOUTH DAILY.   metoprolol  tartrate (LOPRESSOR ) 100 MG tablet TAKE ONE TABLET BY MOUTH TWICE A DAY   Multiple Vitamin (MULTIVITAMIN WITH MINERALS) TABS tablet Take 1 tablet by mouth daily with lunch. Centrum Silver.   torsemide  (DEMADEX ) 10 MG tablet Take 20 mg (2 tablets) in the morning and 10 mg (1 tablet) in the evening. (Patient taking differently: Take 20 mg by mouth 2 (two) times daily. Take 20 mg (2 tablets) in the morning and 10 mg (1 tablet) in the evening.)   No facility-administered encounter medications on file as of 12/19/2024.    History: Past Medical History:  Diagnosis Date   Actinic keratosis    Allergy    Anemia    Aortic atherosclerosis    Aortic insufficiency    a.) s/p AVR 04/07/2022 --> 19 mm Inspiris Resilia valve   Arthritis    Atrial fibrillation and flutter (HCC)    a.) s/p ablation 1998; b.) CHA2DS2-VASc: 4 (age, sex, vascular disease history, T2DM); b.) rate/rhythm maintained on oral metoprolol  tartrate; no chronic OAC   Basal cell carcinoma 06/05/2019   R low back, ED[facility] 07/16/19   Basal cell carcinoma 04/15/2010   Right post shoulder   Basal cell carcinoma 04/15/2010   Mid back   Basal cell carcinoma 07/13/2023   left top of shoulder, [facility] 09/26/23   Basal cell carcinoma (BCC) 09/29/2016   Left post  shoulder. Superficial and nodular.   BCC (basal cell carcinoma) 07/21/2021   right mid back, [facility] 09/15/2021   BCC (basal cell carcinoma) 01/27/2022   left lower abdomen, [facility] 07/28/22   BCC (basal cell carcinoma) 07/28/2022   right lateral forehead, [facility] 08/10/22   BCC (basal cell carcinoma) 07/28/2022   left buttock, ED[facility] 08/10/22   Bilateral pleural effusion    Bleeding gastric ulcer 2000   Breast cancer, left (HCC) 2005   a.)  s/p LEFT mastectomy 2005 --> lymph nodes were (+) --> Tx'd with systemic antineoplastic chemotherapy   CAD (coronary artery disease)    a.) cCTA 04/05/2022: Ca2+ 23 (51st %'ile for age/sex/race matched control); 25-49% mLAD calcification   Cataract    CHB (complete heart block) (HCC)    a.) symptomatic; s/p Medtronic Azure XT DR MRI SureScan PPM placement 09/19/2017   CHF (congestive heart failure) (HCC)    Clotting disorder 2000   COPD (chronic obstructive pulmonary disease) (HCC)    Diet-controlled type 2 diabetes mellitus (HCC)    Dysphonia    Dyspnea    GERD (gastroesophageal reflux disease)    Glaucoma    History of basal cell carcinoma (BCC) 03/22/2021   right neck and right medial ear, Moh's   History of bilateral cataract extraction    History of blood transfusion    Hodgkin's lymphoma (HCC) 1974   a.) s/p laparotomy for splenectomy and liver Bx  in 1974 --> Tx'd with systemic chemotherapy + chest XRT --> recurrence 2 years later requiring additional systemic chemotherapy and XRT   Hypothyroidism    Lower leg edema    Mitral valve stenosis    a.) s/p minimally invasive MVR 09/13/2017 --> 25 mm Medtronic Mosaic porcine stented bioprosthetic tissue valve   NSVT (nonsustained ventricular tachycardia) (HCC)    Osteoporosis    lumbar verterbral fracture, left ankle fracture, dexa 2015   Pancreatitis    Pneumonia    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   a.) Medtronic Azure XT DR MRI SureScan [id number] (SN: [id number])   Pulmonary HTN (HCC)     a.) TTE: 05/14/14: RVSP 65; b.) TTE 08/31/15: RVSP 51, c.) TTE 12/23/16: RVSP 78; d.) RHC 06/27/17: mRA 6, mPA 37, mPCWP 18, AO sat 99, PA sat 67, CO 4.15, CI 2.43, PVR 4.6, AoV peak-peak 12, mitral MPG 11; e.) TTE 11/10/17: RVSP 63, f.) TTE 10/19/18: RVSP 52; g.) TTE 10/09/20: RVSP 42.9; h.) TTE 12/22/21: RVSP 54.7; i.) TTE 05/17/22: 47.7; j.) TTE 05/26/22: RVSP 40.5; k.) TTE 05/30/23: RVSP 37.1   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/07/2022   a.) s/p AVR 04/07/2022 --> 19 mm Inspiris Resilia valve   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 09/13/2017   a.) 25 mm Medtronic Mosaic porcine stented bioprosthetic tissue valve   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   Left medial lower leg   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   right anterior thigh, EDC 08/10/22   Syncope    Tachycardia    Past Surgical History:  Procedure Laterality Date   AORTIC VALVE REPLACEMENT N/A 04/07/2022   Procedure: AORTIC VALVE REPLACEMENT (AVR), USING INSPIRIS 19 MM AORTIC VALVE;  Surgeon: Lucas Dorise POUR, MD;  Location: [facility] OR;  Service: Open Heart Surgery;  Laterality: N/A;   ATRIAL FLUTTER ABLATION N/A 1998   BREAST SURGERY     BUBBLE STUDY  01/12/2022   Procedure: BUBBLE STUDY;  Surgeon: Barbaraann Darryle Ned, MD;  Location: [facility] ENDOSCOPY;  Service: Cardiovascular;;   CARDIAC ELECTROPHYSIOLOGY MAPPING AND ABLATION  1999   CARDIAC VALVE REPLACEMENT  2018   And 2022   CATARACT EXTRACTION, BILATERAL     COLONOSCOPY     EYE SURGERY     FRACTURE SURGERY     IR RADIOLOGY PERIPHERAL GUIDED IV START  08/03/2017   IR THORACENTESIS RIGHT ASP PLEURAL SPACE W/IMG GUIDE  05/23/2022   IR THORACENTESIS RIGHT ASP PLEURAL SPACE W/IMG GUIDE  05/24/2022   IR US  GUIDE VASC ACCESS RIGHT  08/03/2017   LAPAROTOMY N/A 1974   MASTECTOMY Left 2005   MICROLARYNGOSCOPY Bilateral 08/22/2023   Procedure: SUSPENSION MICROLARYNGOSCOPY WITH INJECTION;  Surgeon: Milissa Hamming, MD;   Location: [facility] ORS;  Service: ENT;  Laterality: Bilateral;   MITRAL VALVE REPAIR Right 09/13/2017   Procedure: MINIMALLY INVASIVE MITRAL VALVE REPLACEMENT (MVR) with Medtronic Mosaic Porcine Heart Valve size 25mm;  Surgeon: Dusty Sudie DEL, MD;  Location: [facility] OR;  Service: Open Heart Surgery;  Laterality: Right;   ORIF ANKLE FRACTURE Left 03/21/2014   Procedure: LEFT ANKLE FRACTURE OPEN TREATMENT BILMALLEOLAR INCLUDES INTERNAL FIXATION ;  Surgeon: Evalene JONETTA Chancy, MD;  Location: [facility];  Service: Orthopedics;  Laterality: Left;   PACEMAKER IMPLANT N/A 09/19/2017   Medtronic Azure XT MRI conditional dual-chamber pacemaker for symptomatic complete heart block  by Dr Kelsie   RIGHT/LEFT HEART CATH AND CORONARY ANGIOGRAPHY N/A 06/27/2017   Procedure: Right/Left Heart  Cath and Coronary Angiography;  Surgeon: Rolan Ezra RAMAN, MD;  Location: [facility] INVASIVE CV LAB;  Service: Cardiovascular;  Laterality: N/A;   SPLENECTOMY  1974   hodgkins disease   TEE WITHOUT CARDIOVERSION  06/12/2012   Procedure: TRANSESOPHAGEAL ECHOCARDIOGRAM (TEE);  Surgeon: Ezra RAMAN Rolan, MD;  Location: [facility] ENDOSCOPY;  Service: Cardiovascular;  Laterality: N/A;   TEE WITHOUT CARDIOVERSION N/A 06/29/2017   Procedure: TRANSESOPHAGEAL ECHOCARDIOGRAM (TEE);  Surgeon: Jeffrie Oneil BROCKS, MD;  Location: [facility] ENDOSCOPY;  Service: Cardiovascular;  Laterality: N/A;   TEE WITHOUT CARDIOVERSION N/A 09/13/2017   Procedure: TRANSESOPHAGEAL ECHOCARDIOGRAM (TEE);  Surgeon: Dusty Sudie DEL, MD;  Location: [facility] OR;  Service: Open Heart Surgery;  Laterality: N/A;   TEE WITHOUT CARDIOVERSION N/A 01/12/2022   Procedure: TRANSESOPHAGEAL ECHOCARDIOGRAM (TEE);  Surgeon: Barbaraann Darryle Ned, MD;  Location: [facility] ENDOSCOPY;  Service: Cardiovascular;  Laterality: N/A;   TEE WITHOUT CARDIOVERSION N/A 04/07/2022   Procedure: TRANSESOPHAGEAL ECHOCARDIOGRAM (TEE);  Surgeon: Lucas Dorise POUR, MD;  Location: [facility] OR;  Service: Open Heart Surgery;   Laterality: N/A;   TONSILLECTOMY AND ADENOIDECTOMY     Family History  Problem Relation Age of Onset   Rheumatic fever Father        Died suddenly age 44   Diabetes Father    Cancer Father        colon   Heart disease Father        rheumatic fever x 3   Arthritis Maternal Grandmother    Hypertension Maternal Grandmother    Diabetes Paternal Grandmother    Vision loss Paternal Grandmother        glaucoma   Social History   Occupational History   Occupation: Runner, Broadcasting/film/video (retired)  Tobacco Use   Smoking status: Never   Smokeless tobacco: Never  Vaping Use   Vaping status: Never Used  Substance and Sexual Activity   Alcohol use: Never   Drug use: Never   Sexual activity: Not Currently    Birth control/protection: None   Tobacco Counseling Counseling given: Not Answered  SDOH Screenings   Food Insecurity: No Food Insecurity (12/18/2024)  Housing: Low Risk (12/18/2024)  Transportation Needs: No Transportation Needs (12/18/2024)  Utilities: Not At Risk (12/19/2024)  Alcohol Screen: Low Risk (12/14/2023)  Depression (PHQ2-9): Low Risk (12/19/2024)  Financial Resource Strain: Low Risk (12/18/2024)  Physical Activity: Insufficiently Active (12/18/2024)  Social Connections: Moderately Integrated (12/18/2024)  Stress: No Stress Concern Present (12/18/2024)  Tobacco Use: Low Risk (12/19/2024)  Health Literacy: Adequate Health Literacy (12/19/2024)   See flowsheets for full screening details  Depression Screen PHQ 2 & 9 Depression Scale- Over the past 2 weeks, how often have you been bothered by any of the following problems? Little interest or pleasure in doing things: 0 Feeling down, depressed, or hopeless (PHQ Adolescent also includes...irritable): 0 PHQ-2 Total Score: 0     Goals Addressed             This Visit's Progress    Patient Stated   On track    Maintain current lifestyle             Objective:    Today's Vitals   12/19/24 1428  Weight: 109 lb (49.4 kg)  Height: 5'  6 (1.676 m)   Body mass index is 17.59 kg/m.  Hearing/Vision screen Vision Screening - Comments:: Wears rx glasses - up to date with routine eye exams with Dr. McCuen  Immunizations and Health Maintenance Health Maintenance  Topic Date Due   FOOT EXAM  Never done   Diabetic kidney evaluation - Urine ACR  Never done   HEMOGLOBIN A1C  10/05/2022   COVID-19 Vaccine (6 - 2025-26 season) 08/12/2024   OPHTHALMOLOGY EXAM  04/23/2025   Mammogram  09/17/2025   Diabetic kidney evaluation - eGFR measurement  12/16/2025   Medicare Annual Wellness (AWV)  12/19/2025   DTaP/Tdap/Td (2 - Td or Tdap) 12/11/2031   Colonoscopy  02/26/2033   Pneumococcal Vaccine: 50+ Years  Completed   Influenza Vaccine  Completed   Bone Density Scan  Completed   Hepatitis C Screening  Completed   Zoster Vaccines- Shingrix  Completed   Meningococcal B Vaccine  Aged Out        Assessment/Plan:  This is a routine wellness examination for Patricia [name].  Patient Care Team: Duanne Butler DASEN, MD as PCP - General (Family Medicine) Lavona Agent, MD as PCP - Cardiology (Cardiology) Waddell Danelle ORN, MD as PCP - Electrophysiology (Cardiology) Kristie Lamprey, MD as Consulting Physician (Gastroenterology) Lucas Dorise POUR, MD as Consulting Physician (Cardiothoracic Surgery) Leslee Reusing, MD as Consulting Physician (Ophthalmology) Milissa Hamming, MD as Referring Physician (Otolaryngology) Hester Alm BROCKS, MD (Dermatology)  I have personally reviewed and noted the following in the patients chart:   Medical and social history Use of alcohol, tobacco or illicit drugs  Current medications and supplements including opioid prescriptions. Functional ability and status Nutritional status Physical activity Advanced directives List of other physicians Hospitalizations, surgeries, and ER visits in previous 12 months Vitals Screenings to include cognitive, depression, and falls Referrals and appointments  No orders  of the defined types were placed in this encounter.  In addition, I have reviewed and discussed with patient certain preventive protocols, quality metrics, and best practice recommendations. A written personalized care plan for preventive services as well as general preventive health recommendations were provided to patient.   Lavelle Charmaine Browner, LPN   07/12/7972   Return in 1 year (on 12/19/2025).  After Visit Summary: (MyChart) Due to this being a telephonic visit, the after visit summary with patients personalized plan was offered to patient via MyChart   Nurse Notes: No voiced or noted concerns at this time "

## 2024-12-19 NOTE — Patient Instructions (Signed)
 Ms. Schoch,  Thank you for taking the time for your Medicare Wellness Visit. I appreciate your continued commitment to your health goals. Please review the care plan we discussed, and feel free to reach out if I can assist you further.  Please note that Annual Wellness Visits do not include a physical exam. Some assessments may be limited, especially if the visit was conducted virtually. If needed, we may recommend an in-person follow-up with your provider.  Ongoing Care Seeing your primary care provider every 3 to 6 months helps us  monitor your health and provide consistent, personalized care.   Referrals If a referral was made during today's visit and you haven't received any updates within two weeks, please contact the referred provider directly to check on the status.  Recommended Screenings:  Health Maintenance  Topic Date Due   Complete foot exam   Never done   Yearly kidney health urinalysis for diabetes  Never done   Hemoglobin A1C  10/05/2022   COVID-19 Vaccine (6 - 2025-26 season) 08/12/2024   Eye exam for diabetics  04/23/2025   Breast Cancer Screening  09/17/2025   Yearly kidney function blood test for diabetes  12/16/2025   Medicare Annual Wellness Visit  12/19/2025   DTaP/Tdap/Td vaccine (2 - Td or Tdap) 12/11/2031   Colon Cancer Screening  02/26/2033   Pneumococcal Vaccine for age over 38  Completed   Flu Shot  Completed   Osteoporosis screening with Bone Density Scan  Completed   Hepatitis C Screening  Completed   Zoster (Shingles) Vaccine  Completed   Meningitis B Vaccine  Aged Out       12/19/2024    2:29 PM  Advanced Directives  Does Patient Have a Medical Advance Directive? Yes  Type of Advance Directive Healthcare Power of Attorney  Does patient want to make changes to medical advance directive? Yes (MAU/Ambulatory/Procedural Areas - Information given)  Copy of Healthcare Power of Attorney in Chart? Yes - validated most recent copy scanned in chart (See  row information)   Information on Advanced Care Planning can be found at [place]  Secretary of [facility] Advance Health Care Directives Advance Health Care Directives ([email or web address]   Vision: Annual vision screenings are recommended for early detection of glaucoma, cataracts, and diabetic retinopathy. These exams can also reveal signs of chronic conditions such as diabetes and high blood pressure.  Dental: Annual dental screenings help detect early signs of oral cancer, gum disease, and other conditions linked to overall health, including heart disease and diabetes.  Please see the attached documents for additional preventive care recommendations.

## 2024-12-22 ENCOUNTER — Ambulatory Visit: Payer: Self-pay | Admitting: Cardiology

## 2024-12-24 ENCOUNTER — Ambulatory Visit: Payer: Medicare PPO

## 2024-12-24 DIAGNOSIS — I4892 Unspecified atrial flutter: Secondary | ICD-10-CM | POA: Diagnosis not present

## 2024-12-25 LAB — CUP PACEART REMOTE DEVICE CHECK
Battery Remaining Longevity: 26 mo
Battery Voltage: 2.9 V
Brady Statistic AP VP Percent: 0.01 %
Brady Statistic AP VS Percent: 0 %
Brady Statistic AS VP Percent: 99.98 %
Brady Statistic AS VS Percent: 0 %
Brady Statistic RA Percent Paced: 0.01 %
Brady Statistic RV Percent Paced: 100 %
Date Time Interrogation Session: 20260112225228
Implantable Lead Connection Status: 753985
Implantable Lead Connection Status: 753985
Implantable Lead Implant Date: 20181009
Implantable Lead Implant Date: 20181009
Implantable Lead Location: 753859
Implantable Lead Location: 753860
Implantable Lead Model: 5076
Implantable Lead Model: 5076
Implantable Pulse Generator Implant Date: 20181009
Lead Channel Impedance Value: 247 Ohm
Lead Channel Impedance Value: 285 Ohm
Lead Channel Impedance Value: 342 Ohm
Lead Channel Impedance Value: 399 Ohm
Lead Channel Pacing Threshold Amplitude: 0.75 V
Lead Channel Pacing Threshold Amplitude: 0.875 V
Lead Channel Pacing Threshold Pulse Width: 0.4 ms
Lead Channel Pacing Threshold Pulse Width: 0.4 ms
Lead Channel Sensing Intrinsic Amplitude: 2.625 mV
Lead Channel Sensing Intrinsic Amplitude: 2.625 mV
Lead Channel Sensing Intrinsic Amplitude: 4.375 mV
Lead Channel Sensing Intrinsic Amplitude: 4.375 mV
Lead Channel Setting Pacing Amplitude: 1.75 V
Lead Channel Setting Pacing Amplitude: 2.5 V
Lead Channel Setting Pacing Pulse Width: 0.4 ms
Lead Channel Setting Sensing Sensitivity: 2 mV
Zone Setting Status: 755011
Zone Setting Status: 755011

## 2024-12-27 NOTE — Progress Notes (Signed)
 Remote PPM Transmission

## 2024-12-29 ENCOUNTER — Ambulatory Visit: Payer: Self-pay | Admitting: Cardiology

## 2025-01-03 ENCOUNTER — Ambulatory Visit (HOSPITAL_COMMUNITY)
Admission: RE | Admit: 2025-01-03 | Discharge: 2025-01-03 | Disposition: A | Source: Ambulatory Visit | Attending: Cardiology | Admitting: Cardiology

## 2025-01-03 DIAGNOSIS — R0602 Shortness of breath: Secondary | ICD-10-CM | POA: Diagnosis present

## 2025-01-03 LAB — ECHOCARDIOGRAM COMPLETE
AR max vel: 1.11 cm2
AV Area VTI: 1.17 cm2
AV Area mean vel: 1.18 cm2
AV Mean grad: 6 mmHg
AV Peak grad: 10.8 mmHg
Ao pk vel: 1.64 m/s
Area-P 1/2: 4.82 cm2
MV VTI: 1.03 cm2
S' Lateral: 2.16 cm

## 2025-01-03 MED ORDER — PERFLUTREN LIPID MICROSPHERE
1.0000 mL | INTRAVENOUS | Status: AC | PRN
  Administered 2025-01-03: 1 mL via INTRAVENOUS

## 2025-01-22 ENCOUNTER — Other Ambulatory Visit (HOSPITAL_BASED_OUTPATIENT_CLINIC_OR_DEPARTMENT_OTHER)

## 2025-02-06 ENCOUNTER — Other Ambulatory Visit (HOSPITAL_BASED_OUTPATIENT_CLINIC_OR_DEPARTMENT_OTHER)

## 2025-03-12 ENCOUNTER — Ambulatory Visit: Admitting: Dermatology

## 2025-03-25 ENCOUNTER — Ambulatory Visit: Payer: Medicare PPO

## 2025-06-24 ENCOUNTER — Ambulatory Visit: Payer: Medicare PPO

## 2025-09-23 ENCOUNTER — Ambulatory Visit: Payer: Medicare PPO

## 2025-12-23 ENCOUNTER — Ambulatory Visit: Payer: Medicare PPO
